# Patient Record
Sex: Female | Born: 1955 | Race: Black or African American | Hispanic: No | Smoking: Never smoker
Health system: Southern US, Community
[De-identification: ages and names within clinical notes are randomized; demographics above are authoritative.]

## PROBLEM LIST (undated history)

## (undated) DIAGNOSIS — N186 End stage renal disease: Secondary | ICD-10-CM

## (undated) DIAGNOSIS — E1129 Type 2 diabetes mellitus with other diabetic kidney complication: Secondary | ICD-10-CM

## (undated) DIAGNOSIS — E039 Hypothyroidism, unspecified: Secondary | ICD-10-CM

## (undated) DIAGNOSIS — M069 Rheumatoid arthritis, unspecified: Secondary | ICD-10-CM

## (undated) DIAGNOSIS — K219 Gastro-esophageal reflux disease without esophagitis: Secondary | ICD-10-CM

## (undated) DIAGNOSIS — N200 Calculus of kidney: Secondary | ICD-10-CM

## (undated) DIAGNOSIS — E1165 Type 2 diabetes mellitus with hyperglycemia: Secondary | ICD-10-CM

## (undated) DIAGNOSIS — J45909 Unspecified asthma, uncomplicated: Secondary | ICD-10-CM

## (undated) DIAGNOSIS — K259 Gastric ulcer, unspecified as acute or chronic, without hemorrhage or perforation: Secondary | ICD-10-CM

## (undated) DIAGNOSIS — Z87442 Personal history of urinary calculi: Secondary | ICD-10-CM

## (undated) DIAGNOSIS — M109 Gout, unspecified: Secondary | ICD-10-CM

## (undated) DIAGNOSIS — R7303 Prediabetes: Secondary | ICD-10-CM

## (undated) DIAGNOSIS — E7800 Pure hypercholesterolemia, unspecified: Secondary | ICD-10-CM

## (undated) DIAGNOSIS — R011 Cardiac murmur, unspecified: Secondary | ICD-10-CM

## (undated) DIAGNOSIS — Z9289 Personal history of other medical treatment: Secondary | ICD-10-CM

## (undated) DIAGNOSIS — Z8719 Personal history of other diseases of the digestive system: Secondary | ICD-10-CM

## (undated) DIAGNOSIS — M1990 Unspecified osteoarthritis, unspecified site: Secondary | ICD-10-CM

## (undated) DIAGNOSIS — D649 Anemia, unspecified: Secondary | ICD-10-CM

## (undated) DIAGNOSIS — E119 Type 2 diabetes mellitus without complications: Secondary | ICD-10-CM

## (undated) DIAGNOSIS — E785 Hyperlipidemia, unspecified: Secondary | ICD-10-CM

## (undated) DIAGNOSIS — I10 Essential (primary) hypertension: Secondary | ICD-10-CM

## (undated) DIAGNOSIS — N920 Excessive and frequent menstruation with regular cycle: Secondary | ICD-10-CM

## (undated) DIAGNOSIS — G4730 Sleep apnea, unspecified: Secondary | ICD-10-CM

## (undated) HISTORY — DX: Type 2 diabetes mellitus with other diabetic kidney complication: E11.29

## (undated) HISTORY — DX: Excessive and frequent menstruation with regular cycle: N92.0

## (undated) HISTORY — DX: Calculus of kidney: N20.0

## (undated) HISTORY — PX: COLONOSCOPY W/ POLYPECTOMY: SHX1380

## (undated) HISTORY — DX: Gastro-esophageal reflux disease without esophagitis: K21.9

## (undated) HISTORY — DX: Hyperlipidemia, unspecified: E78.5

## (undated) HISTORY — DX: Essential (primary) hypertension: I10

## (undated) HISTORY — DX: Prediabetes: R73.03

## (undated) HISTORY — DX: Type 2 diabetes mellitus with hyperglycemia: E11.65

## (undated) HISTORY — DX: End stage renal disease: N18.6

## (undated) HISTORY — DX: Personal history of other medical treatment: Z92.89

## (undated) HISTORY — DX: Pure hypercholesterolemia, unspecified: E78.00

## (undated) HISTORY — PX: OTHER SURGICAL HISTORY: SHX169

## (undated) HISTORY — DX: Unspecified asthma, uncomplicated: J45.909

---

## 1982-02-20 HISTORY — PX: ECTOPIC PREGNANCY SURGERY: SHX613

## 1986-11-22 HISTORY — PX: TUBAL LIGATION: SHX77

## 1998-03-23 HISTORY — PX: ABDOMINAL HYSTERECTOMY: SHX81

## 2005-08-21 HISTORY — PX: OTHER SURGICAL HISTORY: SHX169

## 2010-03-23 DIAGNOSIS — J189 Pneumonia, unspecified organism: Secondary | ICD-10-CM

## 2010-03-23 HISTORY — DX: Pneumonia, unspecified organism: J18.9

## 2010-08-22 LAB — HM PAP SMEAR: HM Pap smear: NORMAL

## 2011-08-22 LAB — HM MAMMOGRAPHY: HM Mammogram: NORMAL

## 2012-11-25 ENCOUNTER — Ambulatory Visit: Payer: Self-pay | Admitting: General Practice

## 2012-12-07 ENCOUNTER — Encounter: Payer: Self-pay | Admitting: Family Medicine

## 2012-12-07 ENCOUNTER — Ambulatory Visit (INDEPENDENT_AMBULATORY_CARE_PROVIDER_SITE_OTHER): Payer: 59 | Admitting: Family Medicine

## 2012-12-07 VITALS — BP 150/98 | Temp 98.5°F | Ht 62.0 in | Wt 204.0 lb

## 2012-12-07 DIAGNOSIS — N189 Chronic kidney disease, unspecified: Secondary | ICD-10-CM

## 2012-12-07 DIAGNOSIS — E1165 Type 2 diabetes mellitus with hyperglycemia: Secondary | ICD-10-CM | POA: Insufficient documentation

## 2012-12-07 DIAGNOSIS — I10 Essential (primary) hypertension: Secondary | ICD-10-CM | POA: Insufficient documentation

## 2012-12-07 DIAGNOSIS — J45909 Unspecified asthma, uncomplicated: Secondary | ICD-10-CM

## 2012-12-07 DIAGNOSIS — M545 Low back pain, unspecified: Secondary | ICD-10-CM | POA: Insufficient documentation

## 2012-12-07 DIAGNOSIS — E119 Type 2 diabetes mellitus without complications: Secondary | ICD-10-CM | POA: Insufficient documentation

## 2012-12-07 DIAGNOSIS — R209 Unspecified disturbances of skin sensation: Secondary | ICD-10-CM

## 2012-12-07 DIAGNOSIS — N186 End stage renal disease: Secondary | ICD-10-CM | POA: Insufficient documentation

## 2012-12-07 DIAGNOSIS — R7303 Prediabetes: Secondary | ICD-10-CM

## 2012-12-07 DIAGNOSIS — E781 Pure hyperglyceridemia: Secondary | ICD-10-CM | POA: Insufficient documentation

## 2012-12-07 DIAGNOSIS — J4520 Mild intermittent asthma, uncomplicated: Secondary | ICD-10-CM | POA: Insufficient documentation

## 2012-12-07 DIAGNOSIS — E785 Hyperlipidemia, unspecified: Secondary | ICD-10-CM

## 2012-12-07 DIAGNOSIS — R7309 Other abnormal glucose: Secondary | ICD-10-CM

## 2012-12-07 DIAGNOSIS — E669 Obesity, unspecified: Secondary | ICD-10-CM | POA: Insufficient documentation

## 2012-12-07 DIAGNOSIS — Z23 Encounter for immunization: Secondary | ICD-10-CM

## 2012-12-07 DIAGNOSIS — R200 Anesthesia of skin: Secondary | ICD-10-CM

## 2012-12-07 HISTORY — DX: Hyperlipidemia, unspecified: E78.5

## 2012-12-07 MED ORDER — CARVEDILOL 12.5 MG PO TABS: 12.5000 mg | ORAL_TABLET | Freq: Two times a day (BID) | ORAL | Status: DC

## 2012-12-07 NOTE — Patient Instructions (Signed)
-  lab appointment on Monday for fasting labs  -We placed a referral for you as discussed to nephrologist and to back specialist. It usually takes about 1-2 weeks to process and schedule this referral. If you have not heard from [place] regarding this appointment in 2 weeks please contact our office.  -We have ordered labs or studies at this visit. It can take up to 1-2 weeks for results and processing. We will contact you with instructions IF your results are abnormal. Normal results will be released to your [facility]. If you have not heard from [place] or can not find your results in [facility] in 2 weeks please contact our office.  -for the blood pressure: -stop the metoprolol and start the carvedilol (coreg) 12.5 mg twice daily and see the kidney specialist - referral placed  -PLEASE SIGN UP FOR [place]   We recommend the following healthy lifestyle measures: - eat a healthy diet consisting of lots of vegetables, fruits, beans, nuts, seeds, healthy meats such as white chicken and fish and whole grains.  - avoid fried foods, fast food, processed foods, sodas, red meet and other fattening foods.  - get a least 150 minutes of aerobic exercise per week.   Follow up in: 2-3 months for CPE

## 2012-12-07 NOTE — Progress Notes (Signed)
Chief Complaint  Patient presents with  . Establish Care  . Hypertension  . right leg numbness    HPI:  Rachael George is here to establish care. New to [place]. Last PCP and physical:  Has the following chronic problems and concerns today:  1)Hypertension: -dx a long time -takes amlodipine 10mg , Metoprolol 100mg  qd, 12.5 daily, clonidine 0.2/24hour -severely allergic to lisinopril -reports stage 3 kidney disease and told to see PCP for referral to nephrologist for this and her BP -reports her BP is always out of control  -no regular exercise; no special diet - poor diet -not a smoker -reports told prediabetic -strong FH hypertension -HA, DOE, SOB, CP, palpitations, swelling  2) Low back pain and R leg numbness: -back pain chronically - worse with certain movement only, no tx needed -for last few months mild numbness radiating into R upper lat leg, and sometimes -denies: pain in leg, weakness, malaise, fevers, bowel or bladder incontinence, unexplained weight loss -she wants to see specialist for this  As is very worried about it  3)Asthma: -as a child -only has occ symptoms when very cold now - uses albuterol rarely  4)HLD: -on lipitor    Patient Active Problem List   Diagnosis Date Noted  . Essential hypertension, benign 12/07/2012  . Prediabetes 12/07/2012  . Chronic kidney disease 12/07/2012  . Low back pain 12/07/2012  . Right leg numbness 12/07/2012  . Asthma, mild intermittent 12/07/2012  . Hyperlipemia 12/07/2012  . Obesity, unspecified 12/07/2012    Health Maintenance:  ROS: See pertinent positives and negatives per HPI.  Past Medical History  Diagnosis Date  . Asthma   . GERD (gastroesophageal reflux disease)   . Hypertension   . High cholesterol   . Kidney disease   . Kidney stones   . History of blood transfusion   . Heavy menstrual bleeding   . Prediabetes   . Hyperlipemia 12/07/2012    Family History  Problem Relation Age of  Onset  . Hypertension Mother   . Breast cancer Maternal Aunt     History   Social History  . Marital Status: Married    Spouse Name: N/A    Number of Children: N/A  . Years of Education: N/A   Social History Main Topics  . Smoking status: Never Smoker   . Smokeless tobacco: None  . Alcohol Use: Yes     Comment: occ - 1 drink rarely  . Drug Use: None  . Sexual Activity: None   Other Topics Concern  . 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      Home Situation:  Lives with husband and daughter and granddaughter      Spiritual Beliefs: baptist      Lifestyle: no regular exercise, poor diet             Current outpatient prescriptions:albuterol (PROAIR HFA) 108 (90 BASE) MCG/ACT inhaler, Inhale 2 puffs into the lungs every 6 (six) hours as needed for wheezing., Disp: , Rfl: ;  amLODipine (NORVASC) 10 MG tablet, Take 10 mg by mouth daily., Disp: , Rfl: ;  atorvastatin (LIPITOR) 20 MG tablet, Take 20 mg by mouth daily., Disp: , Rfl:  carvedilol (COREG) 12.5 MG tablet, Take 1 tablet (12.5 mg total) by mouth 2 (two) times daily with a meal., Disp: 60 tablet, Rfl: 3;  cloNIDine (CATAPRES - DOSED IN MG/24 HR) 0.2 mg/24hr patch, Place 1 patch onto  the skin once a week., Disp: , Rfl: ;  hydrochlorothiazide (MICROZIDE) 12.5 MG capsule, Take 12.5 mg by mouth daily., Disp: , Rfl:   EXAM:  Filed Vitals:   12/07/12 0806  BP: 150/98  Temp: 98.5 F (36.9 C)    Body mass index is 37.3 kg/(m^2).  GENERAL: vitals reviewed and listed above, alert, oriented, appears well hydrated and in no acute distress  HEENT: atraumatic, conjunttiva clear, no obvious abnormalities on inspection of external nose and ears  NECK: no obvious masses on inspection  LUNGS: clear to auscultation bilaterally, no wheezes, rales or rhonchi, good air movement  CV: HRRR, no peripheral edema  MS: moves all extremities without noticeable abnormality Normal  Gait Normal inspection of back, no obvious scoliosis or leg length descrepancy No bony TTP Soft tissue TTP at: R lumbar paraspinal muscles -/+ tests: neg trendelenburg,+facet loading on L, -SLRT, -CLRT, -FABER, -FADIR Normal muscle strength, sensation to light touch (except pt reports sl decreased sensation L lateral thigh) and DTRs in LEs bilaterally  PSYCH: pleasant and cooperative, no obvious depression or anxiety  ASSESSMENT AND PLAN:  Discussed the following assessment and plan:  Need for prophylactic vaccination and inoculation against influenza - Plan: Flu Vaccine QUAD 36+ mos PF IM (Fluarix)  Essential hypertension, benign - Plan: Basic metabolic panel, Ambulatory referral to Nephrology, carvedilol (COREG) 12.5 MG tablet -asymptomatic, chronic, uncontrolled -stop qd metoprolol - start coreg bid -referred to nephrology for pt reported chronically very elevated BP and kidney disease for further management -monitor BP at home and call if running high in meantime - rx for new cuff given  Prediabetes - Plan: Hemoglobin A1c -will do screening hgbA1c -lifestyle recs advised  Chronic kidney disease -nephrology referral  Low back pain - Plan: Ambulatory referral to Physical Medicine Rehab -discussed options, likely DDD with mild radicular symptoms- exam fairly benign -pt wishes to see specialist, referral placed  Right leg numbness - Plan: Methylmalonic Acid, Serum, Folate, Ambulatory referral to Physical Medicine Rehab  Asthma, mild intermittent  Hyperlipemia - Plan: Lipid Panel -continue statin, check fasting labs - she wants to return Monday for theses  Obesity, unspecified -lifestyle recs advised  -We reviewed the PMH, PSH, FH, SH, Meds and Allergies. -We provided refills for any medications we will prescribe as needed. -We addressed current concerns per orders and patient instructions. -We have asked for records for pertinent exams, studies, vaccines and notes from  previous providers. -We have advised patient to follow up per instructions below. -flu vaccine given today -f/u with nephrology regarding HTN -CPE in 2 months or next available with follow up ->45 mintues spent face to face in counseling this patient   -Patient advised to return or notify a doctor immediately if symptoms worsen or persist or new concerns arise.  Patient Instructions  -lab appointment on Monday for fasting labs  -We placed a referral for you as discussed to nephrologist and to back specialist. It usually takes about 1-2 weeks to process and schedule this referral. If you have not heard from [place] regarding this appointment in 2 weeks please contact our office.  -We have ordered labs or studies at this visit. It can take up to 1-2 weeks for results and processing. We will contact you with instructions IF your results are abnormal. Normal results will be released to your [facility]. If you have not heard from [place] or can not find your results in [facility] in 2 weeks please contact our office.  -for the blood pressure: -stop  the metoprolol and start the carvedilol (coreg) 12.5 mg twice daily and see the kidney specialist - referral placed  -PLEASE SIGN UP FOR MYCHART TODAY   We recommend the following healthy lifestyle measures: - eat a healthy diet consisting of lots of vegetables, fruits, beans, nuts, seeds, healthy meats such as white chicken and fish and whole grains.  - avoid fried foods, fast food, processed foods, sodas, red meet and other fattening foods.  - get a least 150 minutes of aerobic exercise per week.   Follow up in: 2-3 months for CPE              Onisha Cedeno, [place]

## 2012-12-12 ENCOUNTER — Other Ambulatory Visit (INDEPENDENT_AMBULATORY_CARE_PROVIDER_SITE_OTHER): Payer: 59

## 2012-12-12 ENCOUNTER — Other Ambulatory Visit (HOSPITAL_COMMUNITY): Payer: Self-pay | Admitting: Physical Medicine and Rehabilitation

## 2012-12-12 DIAGNOSIS — R7303 Prediabetes: Secondary | ICD-10-CM

## 2012-12-12 DIAGNOSIS — I10 Essential (primary) hypertension: Secondary | ICD-10-CM

## 2012-12-12 DIAGNOSIS — R7309 Other abnormal glucose: Secondary | ICD-10-CM

## 2012-12-12 DIAGNOSIS — R209 Unspecified disturbances of skin sensation: Secondary | ICD-10-CM

## 2012-12-12 DIAGNOSIS — E785 Hyperlipidemia, unspecified: Secondary | ICD-10-CM

## 2012-12-12 DIAGNOSIS — R200 Anesthesia of skin: Secondary | ICD-10-CM

## 2012-12-12 DIAGNOSIS — M48061 Spinal stenosis, lumbar region without neurogenic claudication: Secondary | ICD-10-CM

## 2012-12-12 LAB — LIPID PANEL
Cholesterol: 289 mg/dL — ABNORMAL HIGH (ref 0–200)
HDL: 42.8 mg/dL (ref >39.00)
Total CHOL/HDL Ratio: 7
Triglycerides: 241 mg/dL — ABNORMAL HIGH (ref 0.0–149.0)
VLDL: 48.2 mg/dL — ABNORMAL HIGH (ref 0.0–40.0)

## 2012-12-12 LAB — BASIC METABOLIC PANEL
BUN: 40 mg/dL — ABNORMAL HIGH (ref 6–23)
CO2: 29 mEq/L (ref 19–32)
Calcium: 9.2 mg/dL (ref 8.4–10.5)
Chloride: 107 mEq/L (ref 96–112)
Creatinine, Ser: 2.3 mg/dL — ABNORMAL HIGH (ref 0.4–1.2)
GFR: 28.51 mL/min — ABNORMAL LOW (ref >60.00)
Glucose, Bld: 92 mg/dL (ref 70–99)
Potassium: 3.3 mEq/L — ABNORMAL LOW (ref 3.5–5.1)
Sodium: 140 mEq/L (ref 135–145)

## 2012-12-12 LAB — HEMOGLOBIN A1C: Hgb A1c MFr Bld: 6.5 % (ref 4.6–6.5)

## 2012-12-12 LAB — LDL CHOLESTEROL, DIRECT: Direct LDL: 165.8 mg/dL

## 2012-12-12 LAB — FOLATE: Folate: 8.7 ng/mL (ref >5.9)

## 2012-12-13 ENCOUNTER — Telehealth: Payer: Self-pay | Admitting: Family Medicine

## 2012-12-13 ENCOUNTER — Encounter: Payer: Self-pay | Admitting: Family Medicine

## 2012-12-13 NOTE — Progress Notes (Signed)
Received some records from prior PCP - [facility] in [place], [place]. Place in scan box. Labs from 02/2012 show Cr 2.23 EKG from 3 2012 with ITW II-aVF and V3-V6, poor r wave progression Few other remote labs and foot xray read.

## 2012-12-13 NOTE — Progress Notes (Signed)
Quick Note:  Left a message for return call. ______ 

## 2012-12-13 NOTE — Telephone Encounter (Signed)
Pt at home now and you can call now.

## 2012-12-14 NOTE — Telephone Encounter (Signed)
Pt returning your call again!~!    Cell:: [phone]

## 2012-12-15 MED ORDER — ATORVASTATIN CALCIUM 40 MG PO TABS: 40.0000 mg | ORAL_TABLET | Freq: Every day | ORAL | Status: DC

## 2012-12-15 NOTE — Telephone Encounter (Signed)
Called and spoke with pt and pt is aware.  

## 2012-12-15 NOTE — Progress Notes (Signed)
Quick Note:  Called and spoke with pt and pt is aware. ______ 

## 2012-12-16 ENCOUNTER — Ambulatory Visit (HOSPITAL_COMMUNITY)
Admission: RE | Admit: 2012-12-16 | Discharge: 2012-12-16 | Disposition: A | Discharge status: Home / Self Care | Payer: 59 | Source: Ambulatory Visit | Attending: Physical Medicine and Rehabilitation | Admitting: Physical Medicine and Rehabilitation

## 2012-12-16 DIAGNOSIS — R209 Unspecified disturbances of skin sensation: Secondary | ICD-10-CM | POA: Insufficient documentation

## 2012-12-16 DIAGNOSIS — M129 Arthropathy, unspecified: Secondary | ICD-10-CM | POA: Insufficient documentation

## 2012-12-16 DIAGNOSIS — M5137 Other intervertebral disc degeneration, lumbosacral region: Secondary | ICD-10-CM | POA: Insufficient documentation

## 2012-12-16 DIAGNOSIS — M79609 Pain in unspecified limb: Secondary | ICD-10-CM | POA: Insufficient documentation

## 2012-12-16 DIAGNOSIS — Q619 Cystic kidney disease, unspecified: Secondary | ICD-10-CM | POA: Insufficient documentation

## 2012-12-16 DIAGNOSIS — M48061 Spinal stenosis, lumbar region without neurogenic claudication: Secondary | ICD-10-CM

## 2012-12-16 DIAGNOSIS — M549 Dorsalgia, unspecified: Secondary | ICD-10-CM | POA: Insufficient documentation

## 2012-12-16 DIAGNOSIS — M51379 Other intervertebral disc degeneration, lumbosacral region without mention of lumbar back pain or lower extremity pain: Secondary | ICD-10-CM | POA: Insufficient documentation

## 2013-01-13 ENCOUNTER — Encounter: Payer: 59 | Attending: Family Medicine | Admitting: Dietician

## 2013-01-13 ENCOUNTER — Encounter: Payer: Self-pay | Admitting: Dietician

## 2013-01-13 VITALS — Ht 62.0 in | Wt 200.0 lb

## 2013-01-13 DIAGNOSIS — Z713 Dietary counseling and surveillance: Secondary | ICD-10-CM | POA: Insufficient documentation

## 2013-01-13 DIAGNOSIS — E669 Obesity, unspecified: Secondary | ICD-10-CM | POA: Insufficient documentation

## 2013-01-13 NOTE — Patient Instructions (Addendum)
Get a prescription for test strips.  Eat a meal or snack every 3-5 hours you are awake. Bring snacks to work with you if needed.  Fill half of your plate with vegetables. Limit starches to quarter of your plate. Have protein with meals and snacks along with carbohydrates. Drink mostly water or tea with splenda. Have egg beaters instead of eggs everyday. Aim to get 30 minutes of exercise 5 x week. Limit high fat, high salt meat such as bacon and sausage.

## 2013-01-13 NOTE — Progress Notes (Signed)
  Medical Nutrition Therapy:  Appt start time: 0800 end time:  0900.   Assessment:  Primary concerns today: Rachael George is here since doctor recommended that she talk to a dietitian. She has a history high blood pressure (hospitalized recently for HTN), elevated blood sugar (Hgb A1c 6.5%), leg pain, and obesity.   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 She is interested in working on losing weight to help manage her health conditions and improve leg pain.    Preferred Learning Style:   Visual  Learning Readiness:   Ready  MEDICATIONS: see list   DIETARY INTAKE:  24-hr recall:  B (4 AM): bacon, eggs, cheese, mushrooms or hamburger with water or sweet tea  Snk (PM): none  L (2 PM): steak with green beans, protein, vegetables, and hawaiian rolls with tea  Snk ( PM): none D (7-8PM): sweets or fruit Snk ( PM): oatmeal raisin cookies Beverages: water or sweet tea  Usual physical activity: no structured exercise  Estimated energy needs: 1600 calories 180 g carbohydrates 120 g protein 44 g fat  Progress Towards Goal(s):  In progress.   Nutritional Diagnosis:  NB-1.1 Food and nutrition-related knowledge deficit As related to history of high CHO foods and large portion sizes.  As evidenced by BMI of 36.6, HTN, and Hgb A1c of 6.5%.    Intervention:  Nutrition counseling/diet education. Recommended setting up her home and work environment so that it is easier to make healthy choices. Encouraged her to start exercising with her granddaughter (walking or Wii) for the health of both of them. Plan to follow up about diabetes more specifically in next appointment.   Plan: Get a prescription for test strips.  Eat a meal or snack every 3-5 hours you are awake. Bring snacks to work with you if needed.  Fill half of your plate with vegetables. Limit starches to quarter of your plate. Have protein with meals and snacks along with carbohydrates. Drink  mostly water or tea with splenda. Have egg beaters instead of eggs everyday. Aim to get 30 minutes of exercise 5 x week. Limit high fat, high salt meat such as bacon and sausage.   Teaching Method Utilized:  Visual  Handouts given during visit include:  MyPlate Handout  15 g CHO snacks  Yellow card  Barriers to learning/adherence to lifestyle change: work schedule - 2nd and 3rd shift  Demonstrated degree of understanding via: Teach Back   Monitoring/Evaluation:  Dietary intake, exercise, and body weight in 6 week(s).

## 2013-02-06 ENCOUNTER — Encounter: Payer: Self-pay | Admitting: Family Medicine

## 2013-02-06 ENCOUNTER — Ambulatory Visit (INDEPENDENT_AMBULATORY_CARE_PROVIDER_SITE_OTHER): Payer: 59 | Admitting: Family Medicine

## 2013-02-06 VITALS — BP 140/84 | Temp 98.3°F | Ht 61.75 in | Wt 199.0 lb

## 2013-02-06 DIAGNOSIS — N189 Chronic kidney disease, unspecified: Secondary | ICD-10-CM

## 2013-02-06 DIAGNOSIS — M545 Low back pain, unspecified: Secondary | ICD-10-CM

## 2013-02-06 DIAGNOSIS — L738 Other specified follicular disorders: Secondary | ICD-10-CM

## 2013-02-06 DIAGNOSIS — Z0000 Encounter for general adult medical examination without abnormal findings: Secondary | ICD-10-CM

## 2013-02-06 DIAGNOSIS — R7303 Prediabetes: Secondary | ICD-10-CM

## 2013-02-06 DIAGNOSIS — I10 Essential (primary) hypertension: Secondary | ICD-10-CM

## 2013-02-06 DIAGNOSIS — E669 Obesity, unspecified: Secondary | ICD-10-CM

## 2013-02-06 DIAGNOSIS — J4520 Mild intermittent asthma, uncomplicated: Secondary | ICD-10-CM

## 2013-02-06 DIAGNOSIS — R7309 Other abnormal glucose: Secondary | ICD-10-CM

## 2013-02-06 DIAGNOSIS — J45909 Unspecified asthma, uncomplicated: Secondary | ICD-10-CM

## 2013-02-06 DIAGNOSIS — E785 Hyperlipidemia, unspecified: Secondary | ICD-10-CM

## 2013-02-06 DIAGNOSIS — L853 Xerosis cutis: Secondary | ICD-10-CM

## 2013-02-06 MED ORDER — CLONIDINE HCL 0.2 MG/24HR TD PTWK: 0.2000 mg | MEDICATED_PATCH | TRANSDERMAL | Status: DC

## 2013-02-06 MED ORDER — ATORVASTATIN CALCIUM 40 MG PO TABS: 40.0000 mg | ORAL_TABLET | Freq: Every day | ORAL | Status: DC

## 2013-02-06 MED ORDER — CARVEDILOL 12.5 MG PO TABS: 12.5000 mg | ORAL_TABLET | Freq: Two times a day (BID) | ORAL | Status: DC

## 2013-02-06 MED ORDER — AMLODIPINE BESYLATE 10 MG PO TABS: 10.0000 mg | ORAL_TABLET | Freq: Every day | ORAL | Status: DC

## 2013-02-06 MED ORDER — HYDROCHLOROTHIAZIDE 12.5 MG PO CAPS: 12.5000 mg | ORAL_CAPSULE | Freq: Every day | ORAL | Status: DC

## 2013-02-06 NOTE — Progress Notes (Signed)
Chief Complaint  Patient presents with  . Annual Exam    HPI:  Here for CPE:  -Concerns today:   1)Hypertension:  -dx a long time ago, chronically uncontrolled - referred to renal last visit for reported chronically uncontrolled htn and renal disease-she has not yet made an appt but BP is better -also changed BB and provided rx for new cuff last visit  -takes amlodipine 10mg , coreg, 12.5 daily, clonidine 0.2/24hour  -severely allergic to lisinopril  -no regular exercise;  no special diet - poor diet  -not a smoker  -strong FH hypertension   -HA, DOE, SOB, CP, palpitations, swelling  2) Low back pain and R leg numbness:  -back pain chronically - worse with certain movement only, no tx needed  -for last few months mild numbness radiating into R upper lat leg, and sometimes  -denies: pain in leg, weakness, malaise, fevers, bowel or bladder incontinence, unexplained weight loss  -referred last visit to [place] - reports saw Dr. Mina Marble, had imaging and MRI - she is scheduled to get cortisone shots   3)Asthma:  -as a child  -only has occ symptoms when very cold now - uses albuterol rarely   4)HLD:  -on lipitor - increased last visit, but she has not gotten the rx yet because sent to different pharmacy  6) Mild diabetes: -referred to nutritionist, lifestyle recs  7)CKD: -referred to nephrology last vist  -Diet: variety of foods, balance and well rounded, larger portion sizes - saw nutritionist - working on increasing veggies, portion sizes, has follow up in December to talk about diabetes  -Vit D and calcium: yes  -Exercise: 30 minutes walking a few times per week, going to start zumba  -Vaccines: UTD  -pap history: 10/2011, normal and she reports all have been normal  -FDLMP: postmenopausal, s/p hysterectomy  -sexual activity: yes, female partner, no new partners  -wants STI testing, hep C: no  -FH breast, colon or ovarian ca: see FH -last mammo 2012 -refused colonoscopy  now, will do stool cards  -Alcohol, Tobacco, drug use: see social history  Review of Systems - Review of Systems  Constitutional: Negative for fever, weight loss and malaise/fatigue.  HENT: Negative for ear pain and hearing loss.   Eyes: Negative for blurred vision.  Respiratory: Negative for cough and shortness of breath.   Cardiovascular: Negative for chest pain, palpitations and leg swelling.  Gastrointestinal: Negative for nausea, blood in stool and melena.  Genitourinary: Negative for dysuria.  Musculoskeletal: Positive for back pain and myalgias. Negative for falls.  Skin: Negative for rash.  Neurological: Negative for dizziness and loss of consciousness.  Endo/Heme/Allergies: Does not bruise/bleed easily.  Psychiatric/Behavioral: Negative for depression and memory loss.     Past Medical History  Diagnosis Date  . Asthma   . GERD (gastroesophageal reflux disease)   . Hypertension   . High cholesterol   . Kidney disease   . Kidney stones   . History of blood transfusion   . Heavy menstrual bleeding   . Prediabetes   . Hyperlipemia 12/07/2012    Past Surgical History  Procedure Laterality Date  . Abdominal hysterectomy  2000  . Left shoulder surgery  08/2005  . Tubal ligation  11/1986  . Ectopic pregnancy surgery  02/1982    Family History  Problem Relation Age of Onset  . Hypertension Mother   . Breast cancer Maternal Aunt     History   Social History  . Marital Status: Married  Spouse Name: N/A    Number of Children: N/A  . Years of Education: N/A   Social History Main Topics  . Smoking status: Never Smoker   . Smokeless tobacco: None  . Alcohol Use: Yes     Comment: occ - 1 drink rarely  . Drug Use: None  . Sexual Activity: None   Other Topics Concern  . 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      Home Situation:  Lives with husband and daughter and granddaughter      Spiritual Beliefs: baptist       Lifestyle: no regular exercise, poor diet             Current outpatient prescriptions:albuterol (PROAIR HFA) 108 (90 BASE) MCG/ACT inhaler, Inhale 2 puffs into the lungs every 6 (six) hours as needed for wheezing., Disp: , Rfl: ;  amLODipine (NORVASC) 10 MG tablet, Take 1 tablet (10 mg total) by mouth daily., Disp: 90 tablet, Rfl: 1;  atorvastatin (LIPITOR) 40 MG tablet, Take 1 tablet (40 mg total) by mouth daily., Disp: 90 tablet, Rfl: 1 carvedilol (COREG) 12.5 MG tablet, Take 1 tablet (12.5 mg total) by mouth 2 (two) times daily with a meal., Disp: 180 tablet, Rfl: 1;  carvedilol (COREG) 12.5 MG tablet, Take 1 tablet (12.5 mg total) by mouth 2 (two) times daily with a meal., Disp: 180 tablet, Rfl: 1;  cloNIDine (CATAPRES - DOSED IN MG/24 HR) 0.2 mg/24hr patch, Place 1 patch (0.2 mg total) onto the skin once a week., Disp: 4 patch, Rfl: 5 hydrochlorothiazide (MICROZIDE) 12.5 MG capsule, Take 1 capsule (12.5 mg total) by mouth daily., Disp: 90 capsule, Rfl: 1  EXAM:  Filed Vitals:   02/06/13 0813  BP: 140/84  Temp: 98.3 F (36.8 C)    GENERAL: vitals reviewed and listed below, alert, oriented, appears well hydrated and in no acute distress  HEENT: head atraumatic, PERRLA, normal appearance of eyes, ears, nose and mouth. moist mucus membranes.  NECK: supple, no masses or lymphadenopathy  LUNGS: clear to auscultation bilaterally, no rales, rhonchi or wheeze  CV: HRRR, no peripheral edema or cyanosis, normal pedal pulses  BREAST: normal appearance - no lesions or discharge, on palpation normal breast tissue without any suspicious masses  ABDOMEN: bowel sounds normal, soft, non tender to palpation, no masses, no rebound or guarding  GU: deferred  RECTAL: refused  SKIN: no rash or abnormal lesions  MS: normal gait, moves all extremities normally  NEURO: CN II-XII grossly intact, normal muscle strength and sensation to light touch on extremities  PSYCH: normal affect,  pleasant and cooperative  ASSESSMENT AND PLAN:  Discussed the following assessment and plan:  Visit for preventive health examination -Discussed and advised all [place] preventive services health task force level A and B recommendations for age, sex and risks. -advised mammo and info to schedule given, advised self breast exams -discussed colon cancer screening - she doesn't want to do colonoscopy now so stool cards given -advised vit d and calcium -Advised at least 150 minutes of exercise per week and a healthy diet low in saturated fats and sweets and consisting of fresh fruits and vegetables, lean meats such as fish and white chicken and whole grains.  -labs, studies and vaccines per orders this encounter  Essential hypertension, benign - Plan: carvedilol (COREG) 12.5 MG tablet, carvedilol (COREG) 12.5 MG tablet -cont current meds, get cuff and monitor, make appointment with renal  Prediabetes -nutritionist, diet and exercise  Chronic kidney disease -referred to renal but she has not yet scheduled appt - advised she call to get appt today  Low back pain -seeing PMR -max tylenol dosing info provided  Asthma, mild intermittent -stable  Hyperlipemia -she did not increase statin as advised last visit -advised increased dose, staff sent rx to pharmacy of her choice -check fasting labs at follow up  Obesity, unspecified -lifestyle recs, nutritionist  Dry skin -cerave    No orders of the defined types were placed in this encounter.    Patient Instructions  We recommend the following healthy lifestyle measures: - eat a healthy diet consisting of lots of vegetables, fruits, beans, nuts, seeds, healthy meats such as white chicken and fish and whole grains.  - avoid fried foods, fast food, processed foods, sodas, red meet and other fattening foods.  - get a least 150 minutes of aerobic exercise per week.   Please schedule mammogram now: [phone]  Make sure to call the  kidney doctor and set up an appointment  Make sure to get new prescription for cholesterol medication  Can use topical menthol and casacin sports creams for pain  Can use up to 3000mg  Tylenol daily - NOT MORE  Cerave Cream for dry itchy skin  Follow up 3-4 months      Patient advised to return to clinic immediately if symptoms worsen or persist or new concerns.    No Follow-up on file.  Colin Benton R.

## 2013-02-06 NOTE — Patient Instructions (Signed)
We recommend the following healthy lifestyle measures: - eat a healthy diet consisting of lots of vegetables, fruits, beans, nuts, seeds, healthy meats such as white chicken and fish and whole grains.  - avoid fried foods, fast food, processed foods, sodas, red meet and other fattening foods.  - get a least 150 minutes of aerobic exercise per week.   Please schedule mammogram now: [phone]  Make sure to call the kidney doctor and set up an appointment  Make sure to get new prescription for cholesterol medication  Can use topical menthol and casacin sports creams for pain  Can use up to 3000mg  Tylenol daily - NOT MORE  Cerave Cream for dry itchy skin  Follow up 3-4 months

## 2013-02-06 NOTE — Progress Notes (Signed)
Pre visit review using our clinic review tool, if applicable. No additional management support is needed unless otherwise documented below in the visit note. 

## 2013-02-24 ENCOUNTER — Encounter: Payer: 59 | Attending: Family Medicine | Admitting: Dietician

## 2013-02-24 VITALS — Ht 62.0 in | Wt 199.2 lb

## 2013-02-24 DIAGNOSIS — Z713 Dietary counseling and surveillance: Secondary | ICD-10-CM | POA: Insufficient documentation

## 2013-02-24 DIAGNOSIS — E669 Obesity, unspecified: Secondary | ICD-10-CM | POA: Insufficient documentation

## 2013-02-24 NOTE — Patient Instructions (Addendum)
Eat a meal or snack every 3-5 hours you are awake. Bring snacks to work with you if needed.  Fill half of your plate with vegetables. Limit starches to quarter of your plate. Have protein with meals and snacks along with carbohydrates. Drink mostly water or tea with splenda. Have egg beaters instead of eggs everyday. Aim to get 30 minutes of exercise 5 x week. Limit high fat, high salt meat such as bacon to one slice or have just vegetable with eggs.  Just weigh yourself 1 x week, try to stay motivated by noticing how you feel (clothes and energy).  Consider having oatmeal for breakfast sometimes.

## 2013-02-24 NOTE — Progress Notes (Signed)
  Medical Nutrition Therapy:  Appt start time: 0800 end time:  0830.  Assessment:  Primary concerns today: Rachael George is here for a follow up for high blood pressure and obesity. States she is drinking more water, filling half of her plate with vegetables (using MyPlate proportions). Went back to her doctor and blood pressure when down. Starting walking 2 x week and might look to get a stationary bike.    Preferred Learning Style:   Visual  Learning Readiness:   Ready  MEDICATIONS: see list   DIETARY INTAKE:  24-hr recall:  B (4 AM): 2 pieces of bacon, egg beaters, cheese, mushrooms or with water or decaf coffee Snk (10 AM): fruit, [employer] bar with water L (2:30 PM): [place] sandwich with tea Snk ( PM): none D (6:30-7PM): salad with grilled chicken and fat free dressing with croutons Snk ( PM): none, nabs sometimes Beverages: mostly water or 1 sweet tea and decaf coffee   Usual physical activity: walking 20-25 minutes 2 x week  Estimated energy needs: 1600 calories 180 g carbohydrates 120 g protein 44 g fat  Progress Towards Goal(s):  In progress.   Nutritional Diagnosis:  NB-1.1 Food and nutrition-related knowledge deficit As related to history of high CHO foods and large portion sizes.  As evidenced by BMI of 36.6, HTN, and Hgb A1c of 6.5%.    Intervention:  Nutrition counseling/diet education. Encouraged Asley to keep up the good work with diet and exercise and focus on her energy level and how her clothes are fitting if the number on the scale isn't dropping as quickly as she would like it to.    Plan: Eat a meal or snack every 3-5 hours you are awake. Bring snacks to work with you if needed.  Fill half of your plate with vegetables. Limit starches to quarter of your plate. Have protein with meals and snacks along with carbohydrates. Drink mostly water or tea with splenda. Have egg beaters instead of eggs everyday. Aim to get 30 minutes of exercise 5 x week. Limit  high fat, high salt meat such as bacon to one slice or have just vegetable with eggs.  Just weigh yourself 1 x week, try to stay motivated by noticing how you feel (clothes and energy).  Consider having oatmeal for breakfast sometimes.   Teaching Method Utilized:  Visual  Barriers to learning/adherence to lifestyle change: work schedule - 2nd and 3rd shift  Demonstrated degree of understanding via: Teach Back   Monitoring/Evaluation:  Dietary intake, exercise, and body weight in 2 month(s).

## 2013-03-06 ENCOUNTER — Other Ambulatory Visit: Payer: Self-pay

## 2013-03-06 DIAGNOSIS — Z1231 Encounter for screening mammogram for malignant neoplasm of breast: Secondary | ICD-10-CM

## 2013-03-29 ENCOUNTER — Encounter: Payer: Self-pay | Admitting: Family Medicine

## 2013-03-29 ENCOUNTER — Ambulatory Visit (INDEPENDENT_AMBULATORY_CARE_PROVIDER_SITE_OTHER): Payer: 59 | Admitting: Family Medicine

## 2013-03-29 VITALS — BP 180/100 | HR 72 | Temp 98.6°F | Wt 200.0 lb

## 2013-03-29 DIAGNOSIS — I10 Essential (primary) hypertension: Secondary | ICD-10-CM

## 2013-03-29 DIAGNOSIS — E785 Hyperlipidemia, unspecified: Secondary | ICD-10-CM

## 2013-03-29 DIAGNOSIS — E119 Type 2 diabetes mellitus without complications: Secondary | ICD-10-CM

## 2013-03-29 DIAGNOSIS — R42 Dizziness and giddiness: Secondary | ICD-10-CM

## 2013-03-29 NOTE — Progress Notes (Deleted)
   Subjective:    Patient ID: Rachael George, female    DOB: 05/15/1955, 58 y.o.   MRN: [id number]  HPI    Review of Systems     Objective:   Physical Exam        Assessment & Plan:

## 2013-03-29 NOTE — Progress Notes (Signed)
Pre visit review using our clinic review tool, if applicable. No additional management support is needed unless otherwise documented below in the visit note. 

## 2013-03-29 NOTE — Patient Instructions (Addendum)
-  We placed a referral for you as discussed to the nbeurologist for you dizziness, blood pressure issues, cholesterol problems. It usually takes about 1-2 weeks to process and schedule this referral. If you have not heard from [place] regarding this appointment in 2 weeks please contact our office.  -do exercises provided  -follow up with your nephrologist as advised  -increase coreg to 25mg  twice daily until you see the nephrologist  -if worsening symptoms, CP or new symptoms see a doctor immediately

## 2013-03-29 NOTE — Progress Notes (Signed)
Chief Complaint  Patient presents with  . Dizziness    HPI:  Vertigo: -started 3 days ago -only occurs when turns head suddenly and last briefly - room spinning -denies: nausea, CP, SOB, palpitations, change in edema, HA, vision changes -when did dix hall pike to the L she felt like had blurry vision and the resolved  HTN: -has chronic HTN and kidney disease -sees her nephrologist next week ([facility]) -SBP 160 earlier this week -had angioedema with acei - on diuretic, coreg, norvasc and clonidine  ROS: See pertinent positives and negatives per HPI.  Past Medical History  Diagnosis Date  . Asthma   . GERD (gastroesophageal reflux disease)   . Hypertension   . High cholesterol   . Kidney disease   . Kidney stones   . History of blood transfusion   . Heavy menstrual bleeding   . Prediabetes   . Hyperlipemia 12/07/2012    Past Surgical History  Procedure Laterality Date  . Abdominal hysterectomy  2000  . Left shoulder surgery  08/2005  . Tubal ligation  11/1986  . Ectopic pregnancy surgery  02/1982    Family History  Problem Relation Age of Onset  . Hypertension Mother   . Breast cancer Maternal Aunt     History   Social History  . Marital Status: Married    Spouse Name: N/A    Number of Children: N/A  . Years of Education: N/A   Social History Main Topics  . Smoking status: Never Smoker   . Smokeless tobacco: None  . Alcohol Use: Yes     Comment: occ - 1 drink rarely  . Drug Use: None  . Sexual Activity: None   Other Topics Concern  . 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      Home Situation:  Lives with husband and daughter and granddaughter      Spiritual Beliefs: baptist      Lifestyle: no regular exercise, poor diet             Current outpatient prescriptions:albuterol (PROAIR HFA) 108 (90 BASE) MCG/ACT inhaler, Inhale 2 puffs into the lungs every 6 (six) hours as needed for wheezing.,  Disp: , Rfl: ;  amLODipine (NORVASC) 10 MG tablet, Take 1 tablet (10 mg total) by mouth daily., Disp: 90 tablet, Rfl: 1;  atorvastatin (LIPITOR) 40 MG tablet, Take 1 tablet (40 mg total) by mouth daily., Disp: 90 tablet, Rfl: 1 carvedilol (COREG) 12.5 MG tablet, Take 1 tablet (12.5 mg total) by mouth 2 (two) times daily with a meal., Disp: 180 tablet, Rfl: 1;  carvedilol (COREG) 12.5 MG tablet, Take 1 tablet (12.5 mg total) by mouth 2 (two) times daily with a meal., Disp: 180 tablet, Rfl: 1;  cloNIDine (CATAPRES - DOSED IN MG/24 HR) 0.2 mg/24hr patch, Place 1 patch (0.2 mg total) onto the skin once a week., Disp: 4 patch, Rfl: 5 hydrochlorothiazide (MICROZIDE) 12.5 MG capsule, Take 1 capsule (12.5 mg total) by mouth daily., Disp: 90 capsule, Rfl: 1  EXAM:  Filed Vitals:   03/29/13 0820  BP: 180/100  Pulse: 72  Temp: 98.6 F (37 C)    Body mass index is 36.57 kg/(m^2).  GENERAL: vitals reviewed and listed above, alert, oriented, appears well hydrated and in no acute distress  HEENT: atraumatic, PERRLA, visual acuity grossly intact, conjunttiva clear, no obvious abnormalities on inspection of external nose and ears  NECK: no obvious  masses on inspection, no carotid bruits  LUNGS: clear to auscultation bilaterally, no wheezes, rales or rhonchi, good air movement  CV: HRRR, no peripheral edema  MS: moves all extremities without noticeable abnormality  PSYCH: pleasant and cooperative, no obvious depression or anxiety  NEURO: dix hallpike reproduced symptoms to R, ?blurry vision when tested to the L, no nystagmus, CN II-XII grossly intact, finger to nose normal, gait normal  ASSESSMENT AND PLAN:  Discussed the following assessment and plan:  Dizziness - Plan: Ambulatory referral to Neurology, CANCELED: Ambulatory referral to Cardiology  HTN (hypertension) - Plan: Basic metabolic panel, CANCELED: Ambulatory referral to Cardiology  HLD (hyperlipidemia) - Plan: Lipid Panel, CANCELED:  Ambulatory referral to Cardiology  Diabetes  -dizziness in terms of description seems like middle ear - BBPV and dix hallpike to R reproduced symptoms, to the left she felt like vision was blurry - this concerns me and advised imaging -we discussed possible etiologies (including CV), workup and treatment, treatment risks and return precautions -advised while could be BPPV, other more ominous etiologies must be considered given her risk factors and dix hallpike findings- she is reluctant to do imaging, cardiac testing and prefers to see neurologistt after discussion and then determine if these tests are needed -for HTN advised increasing coreg and she has follow up with her nephrologist next week for management of her resistant HTN -basic labs, she wants to return fastin -of course, we advised Rachael George  to return or notify a doctor immediately if symptoms worsen or persist or new concerns arise. We also discussed ED precuations.    -Patient advised to return or notify a doctor immediately if symptoms worsen or persist or new concerns arise.  Patient Instructions  -We placed a referral for you as discussed to the nbeurologist for you dizziness, blood pressure issues, cholesterol problems. It usually takes about 1-2 weeks to process and schedule this referral. If you have not heard from [place] regarding this appointment in 2 weeks please contact our office.  -do exercises provided  -follow up with your nephrologist as advised  -increase coreg to 25mg  twice daily until you see the nephrologist  -if worsening symptoms, CP or new symptoms see a doctor immediately      Colin Benton R.

## 2013-03-30 ENCOUNTER — Telehealth: Payer: Self-pay

## 2013-03-30 ENCOUNTER — Telehealth: Payer: Self-pay | Admitting: Family Medicine

## 2013-03-30 NOTE — Telephone Encounter (Signed)
Relevant patient education assigned to patient using Emmi. ° °

## 2013-04-03 ENCOUNTER — Telehealth: Payer: Self-pay | Admitting: Family Medicine

## 2013-04-03 NOTE — Telephone Encounter (Signed)
Opened in error

## 2013-04-05 ENCOUNTER — Other Ambulatory Visit (INDEPENDENT_AMBULATORY_CARE_PROVIDER_SITE_OTHER): Payer: 59

## 2013-04-05 DIAGNOSIS — I10 Essential (primary) hypertension: Secondary | ICD-10-CM

## 2013-04-05 DIAGNOSIS — E785 Hyperlipidemia, unspecified: Secondary | ICD-10-CM

## 2013-04-05 LAB — LIPID PANEL
Cholesterol: 215 mg/dL — ABNORMAL HIGH (ref 0–200)
HDL: 40.3 mg/dL (ref >39.00)
Total CHOL/HDL Ratio: 5
Triglycerides: 146 mg/dL (ref 0.0–149.0)
VLDL: 29.2 mg/dL (ref 0.0–40.0)

## 2013-04-05 LAB — BASIC METABOLIC PANEL
BUN: 51 mg/dL — ABNORMAL HIGH (ref 6–23)
CO2: 27 mEq/L (ref 19–32)
Calcium: 9.3 mg/dL (ref 8.4–10.5)
Chloride: 106 mEq/L (ref 96–112)
Creatinine, Ser: 2.9 mg/dL — ABNORMAL HIGH (ref 0.4–1.2)
GFR: 21.92 mL/min — ABNORMAL LOW (ref >60.00)
Glucose, Bld: 99 mg/dL (ref 70–99)
Potassium: 3.2 mEq/L — ABNORMAL LOW (ref 3.5–5.1)
Sodium: 140 mEq/L (ref 135–145)

## 2013-04-05 LAB — LDL CHOLESTEROL, DIRECT: Direct LDL: 130.7 mg/dL

## 2013-04-11 ENCOUNTER — Ambulatory Visit
Admission: RE | Admit: 2013-04-11 | Discharge: 2013-04-11 | Disposition: A | Discharge status: Home / Self Care | Payer: 59 | Source: Ambulatory Visit

## 2013-04-11 DIAGNOSIS — Z1231 Encounter for screening mammogram for malignant neoplasm of breast: Secondary | ICD-10-CM

## 2013-05-05 ENCOUNTER — Ambulatory Visit (INDEPENDENT_AMBULATORY_CARE_PROVIDER_SITE_OTHER): Payer: 59 | Admitting: Neurology

## 2013-05-05 ENCOUNTER — Encounter: Payer: Self-pay | Admitting: Neurology

## 2013-05-05 ENCOUNTER — Encounter: Payer: 59 | Attending: Family Medicine | Admitting: Dietician

## 2013-05-05 VITALS — BP 160/90 | HR 86 | Temp 97.7°F | Ht 62.0 in | Wt 203.0 lb

## 2013-05-05 VITALS — Ht 62.0 in | Wt 202.2 lb

## 2013-05-05 DIAGNOSIS — M25559 Pain in unspecified hip: Secondary | ICD-10-CM

## 2013-05-05 DIAGNOSIS — E669 Obesity, unspecified: Secondary | ICD-10-CM | POA: Insufficient documentation

## 2013-05-05 DIAGNOSIS — G5711 Meralgia paresthetica, right lower limb: Secondary | ICD-10-CM

## 2013-05-05 DIAGNOSIS — M545 Low back pain, unspecified: Secondary | ICD-10-CM

## 2013-05-05 DIAGNOSIS — M25551 Pain in right hip: Secondary | ICD-10-CM

## 2013-05-05 DIAGNOSIS — G5710 Meralgia paresthetica, unspecified lower limb: Secondary | ICD-10-CM

## 2013-05-05 DIAGNOSIS — Z713 Dietary counseling and surveillance: Secondary | ICD-10-CM | POA: Insufficient documentation

## 2013-05-05 NOTE — Patient Instructions (Signed)
The numbness is likely due to pinching of a sensory nerve at the hip region, which causes numbness and tingling on the side of your thigh.  Recommend trying to lose weight.  It does not seem like a pinched nerve from the back. Back and hip pain may be related to arthritis.  Call if you have questions or concerns.

## 2013-05-05 NOTE — Progress Notes (Signed)
  Medical Nutrition Therapy:  Appt start time: 0820 end time:  0850.  Assessment:  Primary concerns today: Rachael George is here for a follow up for high blood pressure and obesity and gained 3 lbs. Has been having a lot pain in her back over the last 3 weeks. Was walking 2 x week up until 3 weeks ago. Still would like to get a stationary bike.   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 One foot swells everyday after working.    Wt Readings from Last 3 Encounters:  05/05/13 202 lb 3.2 oz (91.717 kg)  03/29/13 200 lb (90.719 kg)  02/24/13 199 lb 3.2 oz (90.357 kg)   Ht Readings from Last 3 Encounters:  05/05/13 5\' 2"  (1.575 m)  02/24/13 5\' 2"  (1.575 m)  02/06/13 5' 1.75" (1.568 m)   Body mass index is 36.97 kg/(m^2). @BMIFA @ Normalized weight-for-age data available only for age 58 to 1 years. Normalized stature-for-age data available only for age 58 to 53 years.    Preferred Learning Style:   Visual  Learning Readiness:   Ready  MEDICATIONS: see list   DIETARY INTAKE:  24-hr recall:  B (4 AM): oatmeal or 2 pieces of bacon, egg beaters, cheese, mushrooms or with water or decaf coffee Snk (10 AM): fruit bar with water L (2:30 PM): grilled chicken salad or [place] sandwich with tea Snk ( PM): none D (6:30-7PM): salad with grilled chicken and fat free dressing with croutons or 3 oz meat with vegetables  Snk ( PM): none Beverages: mostly water or 1 sweet tea and decaf coffee   Usual physical activity: none  Estimated energy needs: 1600 calories 180 g carbohydrates 120 g protein 44 g fat  Progress Towards Goal(s):  In progress.   Nutritional Diagnosis:  NB-1.1 Food and nutrition-related knowledge deficit As related to history of high CHO foods and large portion sizes.  As evidenced by BMI of 36.6, HTN, and Hgb A1c of 6.5%.    Intervention:  Nutrition counseling/diet education.   Plan: Eat a meal or snack every 3-5 hours you are awake. Bring  snacks to work with you if needed.  Fill half of your plate with vegetables. Limit starches to quarter of your plate. Have protein with meals and snacks along with carbohydrates. Drink mostly water or tea with splenda. Have egg beaters instead of eggs everyday. Continue to limit high fat, high salt meat such as bacon to one slice or have just vegetable with eggs.  Just weigh yourself 1 x week, try to stay motivated by noticing how you feel (clothes and energy).  Continue having oatmeal for breakfast 3 x week.   Consider going to [facility] exercise classes at the [facility].  Teaching Method Utilized:  Visual  Barriers to learning/adherence to lifestyle change: work schedule - 2nd and 3rd shift  Demonstrated degree of understanding via: Teach Back   Monitoring/Evaluation:  Dietary intake, exercise, and body weight in prn.

## 2013-05-05 NOTE — Progress Notes (Signed)
NEUROLOGY CONSULTATION NOTE  FANTASIA NEWGENT MRN: [id number] DOB: 1955/07/06  Referring provider: Dr. Maudie Mercury Primary care provider: Dr. Maudie Mercury  Reason for consult:  Dizziness.  Right leg numbness.  HISTORY OF PRESENT ILLNESS: Rachael George is a 58 year old left-handed woman with history of hypertension, chronic kidney disease, hyperlipidemia and type II diabetes who presents for right leg numbness and tingling.  Records and images were personally reviewed where available.    She was originally referred for dizziness, but that has since resolved.  She tells me about right leg numbness and tingling that started this past September.  It involves the lateral aspect of her right thigh.  She has non-radiating bilateral back pain, as well as discomfort in the right hip.  There is no shooting pain radiating down the leg.  There is no leg weakness.  There is no bowel or bladder dysfunction.  She denies any recent significant fluctuation in weight.    12/16/12 MRI Lumbar spine: mild multilevel degenerative changes without significant disc protrusion, spinal stenosis or nerve root impingement.   PAST MEDICAL HISTORY: Past Medical History  Diagnosis Date  . Asthma   . GERD (gastroesophageal reflux disease)   . Hypertension   . High cholesterol   . Kidney disease   . Kidney stones   . History of blood transfusion   . Heavy menstrual bleeding   . Prediabetes   . Hyperlipemia 12/07/2012    PAST SURGICAL HISTORY: Past Surgical History  Procedure Laterality Date  . Abdominal hysterectomy  2000  . Left shoulder surgery  08/2005  . Tubal ligation  11/1986  . Ectopic pregnancy surgery  02/1982    MEDICATIONS: Current Outpatient Prescriptions on File Prior to Visit  Medication Sig Dispense Refill  . albuterol (PROAIR HFA) 108 (90 BASE) MCG/ACT inhaler Inhale 2 puffs into the lungs every 6 (six) hours as needed for wheezing.      Marland Kitchen amLODipine (NORVASC) 10 MG tablet Take 1 tablet (10 mg total) by  mouth daily.  90 tablet  1  . atorvastatin (LIPITOR) 40 MG tablet Take 1 tablet (40 mg total) by mouth daily.  90 tablet  1  . carvedilol (COREG) 12.5 MG tablet Take 1 tablet (12.5 mg total) by mouth 2 (two) times daily with a meal.  180 tablet  1  . carvedilol (COREG) 12.5 MG tablet Take 1 tablet (12.5 mg total) by mouth 2 (two) times daily with a meal.  180 tablet  1  . cloNIDine (CATAPRES - DOSED IN MG/24 HR) 0.2 mg/24hr patch Place 1 patch (0.2 mg total) onto the skin once a week.  4 patch  5  . furosemide (LASIX) 40 MG tablet Take 40 mg by mouth 2 (two) times daily.      . hydrochlorothiazide (MICROZIDE) 12.5 MG capsule Take 1 capsule (12.5 mg total) by mouth daily.  90 capsule  1   No current facility-administered medications on file prior to visit.    ALLERGIES: Allergies  Allergen Reactions  . Atacand [Candesartan] Anaphylaxis  . Lisinopril Anaphylaxis  . Motrin [Ibuprofen] Anaphylaxis  . Nsaids Anaphylaxis  . Penicillins Rash    FAMILY HISTORY: Family History  Problem Relation Age of Onset  . Hypertension Mother   . Breast cancer Maternal Aunt     SOCIAL HISTORY: History   Social History  . Marital Status: Married    Spouse Name: N/A    Number of Children: N/A  . Years of Education: N/A   Occupational History  .  Not on file.   Social History Main Topics  . Smoking status: Never Smoker   . Smokeless tobacco: Not on file  . Alcohol Use: Yes     Comment: occ - 1 drink rarely  . Drug Use: Not on file  . Sexual Activity: Not on file   Other Topics Concern  . 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      Home Situation:  Lives with husband and daughter and granddaughter      Spiritual Beliefs: baptist      Lifestyle: no regular exercise, poor diet             REVIEW OF SYSTEMS: Constitutional: No fevers, chills, or sweats, no generalized fatigue, change in appetite Eyes: No visual changes, double  vision, eye pain Ear, nose and throat: No hearing loss, ear pain, nasal congestion, sore throat Cardiovascular: No chest pain, palpitations Respiratory:  No shortness of breath at rest or with exertion, wheezes GastrointestinaI: No nausea, vomiting, diarrhea, abdominal pain, fecal incontinence Genitourinary:  No dysuria, urinary retention or frequency Musculoskeletal:  No neck pain, back pain Integumentary: No rash, pruritus, skin lesions Neurological: as above Psychiatric: No depression, insomnia, anxiety Endocrine: No palpitations, fatigue, diaphoresis, mood swings, change in appetite, change in weight, increased thirst Hematologic/Lymphatic:  No anemia, purpura, petechiae. Allergic/Immunologic: no itchy/runny eyes, nasal congestion, recent allergic reactions, rashes  PHYSICAL EXAM: Filed Vitals:   05/05/13 0857  BP: 160/90  Pulse: 86  Temp: 97.7 F (36.5 C)   General: No acute distress Head:  Normocephalic/atraumatic Neck: supple, no paraspinal tenderness, full range of motion Back: No paraspinal tenderness Heart: regular rate and rhythm Lungs: Clear to auscultation bilaterally. Vascular: No carotid bruits. Neurological Exam: Mental status: alert and oriented to person, place, and time, speech fluent and not dysarthric, language intact. Cranial nerves: CN I: not tested CN II: pupils equal, round and reactive to light, visual fields intact, fundi unremarkable. CN III, IV, VI:  full range of motion, no nystagmus, no ptosis CN V: facial sensation intact CN VII: upper and lower face symmetric CN VIII: hearing intact CN IX, X: gag intact, uvula midline CN XI: sternocleidomastoid and trapezius muscles intact CN XII: tongue midline Bulk & Tone: normal, no fasciculations. Motor:  5/5 throughout Sensation: endorses reduced pinprick sensation and tingling in the lateral aspect of her right leg Deep Tendon Reflexes: 2+ throughout, toes down Finger to nose testing: no  dysmetria Heel to shin: no dysmetria Gait: No ataxia.  Normal stride. Romberg negative.  IMPRESSION: 1.  Right meralgia paresthetica 2.  Back pain, right hip discomfort.  Consider related to arthritis.   PLAN: 1.  Discussed trying to lose weight and not to wear tight pants or belts. 2.  Follow up as needed  45 minutes spent with patient, over 50% spent counseling and coordinating care.  Thank you for allowing me to take part in the care of this patient.  Metta Clines, DO  CC:  Colin Benton, DO

## 2013-05-05 NOTE — Patient Instructions (Addendum)
Eat a meal or snack every 3-5 hours you are awake. Bring snacks to work with you if needed.  Fill half of your plate with vegetables. Limit starches to quarter of your plate. Have protein with meals and snacks along with carbohydrates. Drink mostly water or tea with splenda. Have egg beaters instead of eggs everyday. Continue to limit high fat, high salt meat such as bacon to one slice or have just vegetable with eggs.  Just weigh yourself 1 x week, try to stay motivated by noticing how you feel (clothes and energy).  Continue having oatmeal for breakfast 3 x week.   Consider going to water exercise classes at the [facility].

## 2013-05-09 ENCOUNTER — Ambulatory Visit: Payer: 59 | Admitting: Family Medicine

## 2013-05-12 ENCOUNTER — Encounter: Payer: Self-pay | Admitting: Family Medicine

## 2013-05-12 ENCOUNTER — Ambulatory Visit (INDEPENDENT_AMBULATORY_CARE_PROVIDER_SITE_OTHER): Payer: 59 | Admitting: Family Medicine

## 2013-05-12 VITALS — BP 132/90 | Temp 98.5°F | Wt 200.0 lb

## 2013-05-12 DIAGNOSIS — E669 Obesity, unspecified: Secondary | ICD-10-CM

## 2013-05-12 DIAGNOSIS — R7309 Other abnormal glucose: Secondary | ICD-10-CM

## 2013-05-12 DIAGNOSIS — L659 Nonscarring hair loss, unspecified: Secondary | ICD-10-CM

## 2013-05-12 DIAGNOSIS — R200 Anesthesia of skin: Secondary | ICD-10-CM

## 2013-05-12 DIAGNOSIS — R209 Unspecified disturbances of skin sensation: Secondary | ICD-10-CM

## 2013-05-12 DIAGNOSIS — I10 Essential (primary) hypertension: Secondary | ICD-10-CM

## 2013-05-12 DIAGNOSIS — G5711 Meralgia paresthetica, right lower limb: Secondary | ICD-10-CM

## 2013-05-12 DIAGNOSIS — R7303 Prediabetes: Secondary | ICD-10-CM

## 2013-05-12 DIAGNOSIS — N189 Chronic kidney disease, unspecified: Secondary | ICD-10-CM

## 2013-05-12 DIAGNOSIS — E785 Hyperlipidemia, unspecified: Secondary | ICD-10-CM

## 2013-05-12 DIAGNOSIS — G5710 Meralgia paresthetica, unspecified lower limb: Secondary | ICD-10-CM

## 2013-05-12 MED ORDER — GABAPENTIN 100 MG PO CAPS: 100.0000 mg | ORAL_CAPSULE | Freq: Every day | ORAL | Status: DC

## 2013-05-12 NOTE — Progress Notes (Signed)
Chief Complaint  Patient presents with  . 3 month rov  . Alopecia    HPI:  Follow up:  Low back pain and R leg numbness: - saw neurologist for R leg numbness pain going on per her reports for a few months to a few years -reports she wants leave of absence from work for this because she thinks work makes it worse - however she has been out of work for 1 week and is doing worse -neurologist gave dx meralgia paresthetica and advised loose fitting clothes -she saw ortho for this in [place] and had MRI with mild DDD without sig disc protrusion, spinal stenosis or nerve impingement  HTN: -increased coreg last visit and she was to see her nephrologist for her resistant HTN and CKI -meds: norvasc 10mg , coreg 25 bid, clonidine, furosimide -reports: doing well -denies: CP, swelling, palpitations -has follow up with nephrologist in march  Mild intermittent asthma: -stable  HLD: -on statin -stable  Diet controlled DM: -stable  Alopecia: -denies hair treatments of braiding recently -sudden over several weeks  ROS: See pertinent positives and negatives per HPI.  Past Medical History  Diagnosis Date  . Asthma   . GERD (gastroesophageal reflux disease)   . Hypertension   . High cholesterol   . Kidney disease   . Kidney stones   . History of blood transfusion   . Heavy menstrual bleeding   . Prediabetes   . Hyperlipemia 12/07/2012    Past Surgical History  Procedure Laterality Date  . Abdominal hysterectomy  2000  . Left shoulder surgery  08/2005  . Tubal ligation  11/1986  . Ectopic pregnancy surgery  02/1982    Family History  Problem Relation Age of Onset  . Hypertension Mother   . Breast cancer Maternal Aunt     History   Social History  . Marital Status: Married    Spouse Name: N/A    Number of Children: N/A  . Years of Education: N/A   Social History Main Topics  . Smoking status: Never Smoker   . Smokeless tobacco: None  . Alcohol Use: Yes     Comment:  occ - 1 drink rarely  . Drug Use: None  . Sexual Activity: None   Other Topics Concern  . 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      Home Situation:  Lives with husband and daughter and granddaughter      Spiritual Beliefs: baptist      Lifestyle: no regular exercise, poor diet             Current outpatient prescriptions:albuterol (PROAIR HFA) 108 (90 BASE) MCG/ACT inhaler, Inhale 2 puffs into the lungs every 6 (six) hours as needed for wheezing., Disp: , Rfl: ;  amLODipine (NORVASC) 10 MG tablet, Take 1 tablet (10 mg total) by mouth daily., Disp: 90 tablet, Rfl: 1;  atorvastatin (LIPITOR) 40 MG tablet, Take 1 tablet (40 mg total) by mouth daily., Disp: 90 tablet, Rfl: 1 carvedilol (COREG) 25 MG tablet, Take 25 mg by mouth 2 (two) times daily with a meal., Disp: , Rfl: ;  cloNIDine (CATAPRES - DOSED IN MG/24 HR) 0.2 mg/24hr patch, Place 1 patch (0.2 mg total) onto the skin once a week., Disp: 4 patch, Rfl: 5;  furosemide (LASIX) 40 MG tablet, Take 40 mg by mouth 2 (two) times daily., Disp: , Rfl: ;  gabapentin (NEURONTIN) 100 MG capsule, Take 1 capsule (  100 mg total) by mouth daily., Disp: 30 capsule, Rfl: 1  EXAM:  Filed Vitals:   05/12/13 1015  BP: 132/90  Temp: 98.5 F (36.9 C)    Body mass index is 36.57 kg/(m^2).  GENERAL: vitals reviewed and listed above, alert, oriented, appears well hydrated and in no acute distress  HEENT: atraumatic, conjunttiva clear, no obvious abnormalities on inspection of external nose and ears  NECK: no obvious masses on inspection  LUNGS: clear to auscultation bilaterally, no wheezes, rales or rhonchi, good air movement  CV: HRRR, no peripheral edema  MS: moves all extremities without noticeable abnormality, gait normal  PSYCH: pleasant and cooperative, no obvious depression or anxiety  ASSESSMENT AND PLAN:  Discussed the following assessment and plan:  Meralgia paresthetica of right  side - Plan: gabapentin (NEURONTIN) 100 MG capsule  Essential hypertension, benign  Prediabetes  Chronic kidney disease  Right leg numbness  Hyperlipemia  Obesity, unspecified  Alopecia  -advised sedentary lifestyle or stopping work tends to make chronic back pain and current dx worse and do not advise this -did offer trial of gabapentin for numbness and referral to back/pain specialist if pain in back so bad that she feels she can not work - however her pain actually has gotten worse this week with her not working -sudden alopecia and discussed etiologies and tx and she wishes to see derm - numbers given to call -cont current tx other chronic problems -Patient advised to return or notify a doctor immediately if symptoms worsen or persist or new concerns arise.  Patient Instructions  -start neurontin 100mg  daily and call in 1 week to let us know how this is working and if we need to increase or if you want to see a pain specialist  -see dermatologist for the hair loss  We recommend the following healthy lifestyle measures: - eat a healthy diet consisting of lots of vegetables, fruits, beans, nuts, seeds, healthy meats such as white chicken and fish and whole grains.  - avoid fried foods, fast food, processed foods, sodas, red meet and other fattening foods.  - get a least 150 minutes of aerobic exercise per week.   Follow up in: 1-2 months      Dolores Ewing R.

## 2013-05-12 NOTE — Progress Notes (Signed)
Pre visit review using our clinic review tool, if applicable. No additional management support is needed unless otherwise documented below in the visit note. 

## 2013-05-12 NOTE — Patient Instructions (Signed)
-  start neurontin 100mg  daily and call in 1 week to let us know how this is working and if we need to increase or if you want to see a pain specialist  -see dermatologist for the hair loss  We recommend the following healthy lifestyle measures: - eat a healthy diet consisting of lots of vegetables, fruits, beans, nuts, seeds, healthy meats such as white chicken and fish and whole grains.  - avoid fried foods, fast food, processed foods, sodas, red meet and other fattening foods.  - get a least 150 minutes of aerobic exercise per week.   Follow up in: 1-2 months

## 2013-07-10 ENCOUNTER — Ambulatory Visit: Payer: 59 | Admitting: Family Medicine

## 2013-10-19 ENCOUNTER — Encounter: Payer: Self-pay | Admitting: *Deleted

## 2013-12-25 ENCOUNTER — Encounter: Payer: Self-pay | Admitting: Internal Medicine

## 2013-12-25 ENCOUNTER — Ambulatory Visit: Payer: Self-pay | Attending: Internal Medicine | Admitting: Internal Medicine

## 2013-12-25 VITALS — BP 177/94 | HR 64 | Temp 98.6°F | Resp 18 | Ht 62.0 in | Wt 195.0 lb

## 2013-12-25 DIAGNOSIS — Z8249 Family history of ischemic heart disease and other diseases of the circulatory system: Secondary | ICD-10-CM | POA: Insufficient documentation

## 2013-12-25 DIAGNOSIS — R809 Proteinuria, unspecified: Secondary | ICD-10-CM | POA: Insufficient documentation

## 2013-12-25 DIAGNOSIS — R7309 Other abnormal glucose: Secondary | ICD-10-CM | POA: Insufficient documentation

## 2013-12-25 DIAGNOSIS — E780 Pure hypercholesterolemia: Secondary | ICD-10-CM | POA: Insufficient documentation

## 2013-12-25 DIAGNOSIS — N184 Chronic kidney disease, stage 4 (severe): Secondary | ICD-10-CM | POA: Insufficient documentation

## 2013-12-25 DIAGNOSIS — I129 Hypertensive chronic kidney disease with stage 1 through stage 4 chronic kidney disease, or unspecified chronic kidney disease: Secondary | ICD-10-CM | POA: Insufficient documentation

## 2013-12-25 DIAGNOSIS — Z23 Encounter for immunization: Secondary | ICD-10-CM | POA: Insufficient documentation

## 2013-12-25 DIAGNOSIS — I10 Essential (primary) hypertension: Secondary | ICD-10-CM | POA: Insufficient documentation

## 2013-12-25 DIAGNOSIS — Z880 Allergy status to penicillin: Secondary | ICD-10-CM | POA: Insufficient documentation

## 2013-12-25 DIAGNOSIS — Z888 Allergy status to other drugs, medicaments and biological substances status: Secondary | ICD-10-CM | POA: Insufficient documentation

## 2013-12-25 DIAGNOSIS — R51 Headache: Secondary | ICD-10-CM | POA: Insufficient documentation

## 2013-12-25 DIAGNOSIS — R200 Anesthesia of skin: Secondary | ICD-10-CM | POA: Insufficient documentation

## 2013-12-25 DIAGNOSIS — K219 Gastro-esophageal reflux disease without esophagitis: Secondary | ICD-10-CM | POA: Insufficient documentation

## 2013-12-25 DIAGNOSIS — E785 Hyperlipidemia, unspecified: Secondary | ICD-10-CM | POA: Insufficient documentation

## 2013-12-25 DIAGNOSIS — J45909 Unspecified asthma, uncomplicated: Secondary | ICD-10-CM | POA: Insufficient documentation

## 2013-12-25 DIAGNOSIS — Z79899 Other long term (current) drug therapy: Secondary | ICD-10-CM | POA: Insufficient documentation

## 2013-12-25 DIAGNOSIS — Z87442 Personal history of urinary calculi: Secondary | ICD-10-CM | POA: Insufficient documentation

## 2013-12-25 LAB — POCT URINALYSIS DIPSTICK
Bilirubin, UA: NEGATIVE
Glucose, UA: NEGATIVE
Ketones, UA: NEGATIVE
Leukocytes, UA: NEGATIVE
Nitrite, UA: NEGATIVE
Protein, UA: >=300
Spec Grav, UA: <=1.005
Urobilinogen, UA: 0.2
pH, UA: 5.5

## 2013-12-25 LAB — GLUCOSE, POCT (MANUAL RESULT ENTRY): POC Glucose: 119 mg/dl — AB (ref 70–99)

## 2013-12-25 LAB — POCT GLYCOSYLATED HEMOGLOBIN (HGB A1C): Hemoglobin A1C: 6.3

## 2013-12-25 MED ORDER — CLONIDINE HCL 0.2 MG/24HR TD PTWK: 0.2000 mg | MEDICATED_PATCH | TRANSDERMAL | Status: DC

## 2013-12-25 MED ORDER — ATORVASTATIN CALCIUM 40 MG PO TABS: 40.0000 mg | ORAL_TABLET | Freq: Every day | ORAL | Status: DC

## 2013-12-25 MED ORDER — AMLODIPINE BESYLATE 10 MG PO TABS: 10.0000 mg | ORAL_TABLET | Freq: Every day | ORAL | Status: DC

## 2013-12-25 MED ORDER — CARVEDILOL 25 MG PO TABS
25.0000 mg | ORAL_TABLET | Freq: Two times a day (BID) | ORAL | Status: DC
Start: 2013-12-25 — End: 2015-02-04

## 2013-12-25 MED ORDER — CLONIDINE HCL 0.1 MG PO TABS
0.1000 mg | ORAL_TABLET | Freq: Once | ORAL | Status: AC
  Administered 2013-12-25: 0.1 mg via ORAL

## 2013-12-25 MED ORDER — FUROSEMIDE 40 MG PO TABS: 40.0000 mg | ORAL_TABLET | Freq: Two times a day (BID) | ORAL | Status: DC

## 2013-12-25 NOTE — Patient Instructions (Signed)
Chronic Kidney Disease °Chronic kidney disease occurs when the kidneys are damaged over a long period. The kidneys are two organs that lie on either side of the spine between the middle of the back and the front of the abdomen. The kidneys:  °· Remove wastes and extra water from the blood.   °· Produce important hormones. These help keep bones strong, regulate blood pressure, and help create red blood cells.   °· Balance the fluids and chemicals in the blood and tissues. °A small amount of kidney damage may not cause problems, but a large amount of damage may make it difficult or impossible for the kidneys to work the way they should. If steps are not taken to slow down the kidney damage or stop it from getting worse, the kidneys may stop working permanently. Most of the time, chronic kidney disease does not go away. However, it can often be controlled, and those with the disease can usually live normal lives. °CAUSES  °The most common causes of chronic kidney disease are diabetes and high blood pressure (hypertension). Chronic kidney disease may also be caused by:  °· Diseases that cause the kidneys' filters to become inflamed.   °· Diseases that affect the immune system.   °· Genetic diseases.   °· Medicines that damage the kidneys, such as anti-inflammatory medicines.   °· Poisoning or exposure to toxic substances.   °· A reoccurring kidney or urinary infection.   °· A problem with urine flow. This may be caused by:   °¨ Cancer.   °¨ Kidney stones.   °¨ An enlarged prostate in males. °SIGNS AND SYMPTOMS  °Because the kidney damage in chronic kidney disease occurs slowly, symptoms develop slowly and may not be obvious until the kidney damage becomes severe. A person may have a kidney disease for years without showing any symptoms. Symptoms can include:  °· Swelling (edema) of the legs, ankles, or feet.   °· Tiredness (lethargy).   °· Nausea or vomiting.   °· Confusion.   °· Problems with urination, such as:    °¨ Decreased urine production.   °¨ Frequent urination, especially at night.   °¨ Frequent accidents in children who are potty trained.   °· Muscle twitches and cramps.   °· Shortness of breath.  °· Weakness.   °· Persistent itchiness.   °· Loss of appetite. °· Metallic taste in the mouth. °· Trouble sleeping. °· Slowed development in children. °· Short stature in children. °DIAGNOSIS  °Chronic kidney disease may be detected and diagnosed by tests, including blood, urine, imaging, or kidney biopsy tests.  °TREATMENT  °Most chronic kidney diseases cannot be cured. Treatment usually involves relieving symptoms and preventing or slowing the progression of the disease. Treatment may include:  °· A special diet. You may need to avoid alcohol and foods that are salty and high in potassium.   °· Medicines. These may:   °¨ Lower blood pressure.   °¨ Relieve anemia.   °¨ Relieve swelling.   °¨ Protect the bones. °HOME CARE INSTRUCTIONS  °· Follow your prescribed diet.   °· Take medicines only as directed by your health care provider. Do not take any new medicines (prescription, over-the-counter, or nutritional supplements) unless approved by your health care provider. Many medicines can worsen your kidney damage or need to have the dose adjusted.   °· Quit smoking if you smoke. Talk to your health care provider about a smoking cessation program.   °· Keep all follow-up visits as directed by your health care provider. °SEEK IMMEDIATE MEDICAL CARE IF: °· Your symptoms get worse or you develop new symptoms.   °· You develop symptoms of end-stage kidney disease. These   include:   °¨ Headaches.   °¨ Abnormally dark or light skin.   °¨ Numbness in the hands or feet.   °¨ Easy bruising.   °¨ Frequent hiccups.   °¨ Menstruation stops.   °· You have a fever.   °· You have decreased urine production.   °· You have pain or bleeding when urinating. °MAKE SURE YOU: °· Understand these instructions. °· Will watch your condition. °· Will  get help right away if you are not doing well or get worse. °FOR MORE INFORMATION  °· American Association of Kidney Patients: www.aakp.org °· National Kidney Foundation: www.kidney.org °· American Kidney Fund: www.akfinc.org °· [employer] [facility]: www.lifeoptions.org and www.kidneyschool.org °Document Released: 12/17/2007 Document Revised: 07/24/2013 Document Reviewed: 11/06/2011 °ExitCare® Patient Information ©2015 [employer]. This information is not intended to replace advice given to you by your health care provider. Make sure you discuss any questions you have with your health care provider. ° °

## 2013-12-25 NOTE — Progress Notes (Signed)
Patient ID: Rachael George, female   DOB: Mar 20, 1956, 58 y.o.   MRN: [id number]   [id number]  [id number]  DOB - 12-May-1955  CC:  Chief Complaint  Patient presents with  . Establish Care  . Hypertension  . Asthma  . Hyperlipidemia       HPI: Rachael George is a 58 y.o. female here today to establish medical care.  Patient has a past medical history of CKD stage 4, HTN, HLD, and prediabetes.  She has established care with a Nephrologist and is scheduled for a appointment this week.  She states that she is currently taking amlodipine, coreg, lasix, and lipitor. She states that her clonidine patch was switched to clonidine 0.2 mg TID but stopped the pills due to side effects. She reports that she was told if her GFR drops below 12 she will require dialysis. She reports that her kidneys have been damaged due to long standing hypertension. Patient reports that she gets a sharp pain over her right temple area that causes numbness.  She states that she has numbness over the whole right side of her face. She denies speech abnormalities or facial droop during these events.  This events have been occuring over the past couple of months with the last event occuring last weekend.     Patient has No headache, No chest pain, No abdominal pain - No Nausea, No new weakness tingling or numbness, No Cough - SOB.  Allergies  Allergen Reactions  . Atacand [Candesartan] Anaphylaxis  . Lisinopril Anaphylaxis  . Motrin [Ibuprofen] Anaphylaxis  . Nsaids Anaphylaxis  . Penicillins Rash   Past Medical History  Diagnosis Date  . Asthma   . GERD (gastroesophageal reflux disease)   . Hypertension   . High cholesterol   . Kidney disease   . Kidney stones   . History of blood transfusion   . Heavy menstrual bleeding   . Prediabetes   . Hyperlipemia 12/07/2012   Current Outpatient Prescriptions on File Prior to Visit  Medication Sig Dispense Refill  . albuterol (PROAIR HFA) 108 (90 BASE) MCG/ACT  inhaler Inhale 2 puffs into the lungs every 6 (six) hours as needed for wheezing.      Marland Kitchen amLODipine (NORVASC) 10 MG tablet Take 1 tablet (10 mg total) by mouth daily.  90 tablet  1  . atorvastatin (LIPITOR) 40 MG tablet Take 1 tablet (40 mg total) by mouth daily.  90 tablet  1  . carvedilol (COREG) 25 MG tablet Take 25 mg by mouth 2 (two) times daily with a meal.      . furosemide (LASIX) 40 MG tablet Take 40 mg by mouth 2 (two) times daily.      . cloNIDine (CATAPRES - DOSED IN MG/24 HR) 0.2 mg/24hr patch Place 1 patch (0.2 mg total) onto the skin once a week.  4 patch  5  . gabapentin (NEURONTIN) 100 MG capsule Take 1 capsule (100 mg total) by mouth daily.  30 capsule  1   No current facility-administered medications on file prior to visit.   Family History  Problem Relation Age of Onset  . Hypertension Mother   . Breast cancer Maternal Aunt    History   Social History  . Marital Status: Married    Spouse Name: N/A    Number of Children: N/A  . Years of Education: N/A   Occupational History  . Not on file.   Social History Main Topics  . Smoking status: Never Smoker   .  Smokeless tobacco: Not on file  . Alcohol Use: Yes     Comment: occ - 1 drink rarely  . Drug Use: Not on file  . Sexual Activity: Not on file   Other Topics Concern  . 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  Eyes: Positive for blurred vision.  Respiratory: Negative.   Cardiovascular: Positive for palpitations and leg swelling (right knee). Negative for chest pain and claudication.  Gastrointestinal: Negative.   Genitourinary: Negative.   Musculoskeletal: Positive for joint pain.  Neurological: Positive for tingling  and headaches (pain in temple-with numbness). Negative for dizziness.       Hx of meralgia        Objective:   Filed Vitals:   12/25/13 1623  BP: 177/94  Pulse: 64  Temp: 98.6 F (37 C)  Resp: 18    Physical Exam: Constitutional: Patient appears well-developed and well-nourished. No distress. HENT: Normocephalic, atraumatic, External right and left ear normal. Oropharynx is clear and moist.  Eyes: Conjunctivae and EOM are normal. PERRLA, no scleral icterus. Neck: Normal ROM. Neck supple. No JVD. No tracheal deviation. No thyromegaly. CVS: RRR, S1/S2 +, no murmurs, no gallops, no carotid bruit.  Pulmonary: Effort and breath sounds normal, no stridor, rhonchi, wheezes, rales.  Abdominal: Soft. BS +, no distension, tenderness, rebound or guarding.  Musculoskeletal: Normal range of motion. No edema and no tenderness.  Lymphadenopathy: No lymphadenopathy noted, cervical Neuro: Alert. Normal reflexes, muscle tone coordination. No cranial nerve deficit. Skin: Skin is warm and dry. No rash noted. Not diaphoretic. No erythema. No pallor. Psychiatric: Normal mood and affect. Behavior, judgment, thought content normal.  No results found for this basename: WBC, HGB, HCT, MCV, PLT   Lab Results  Component Value Date   CREATININE 2.9* 04/05/2013   BUN 51* 04/05/2013   NA 140 04/05/2013   K 3.2* 04/05/2013   CL 106 04/05/2013   CO2 27 04/05/2013    Lab Results  Component Value Date   HGBA1C 6.5 12/12/2012   Lipid Panel     Component Value Date/Time   CHOL 215* 04/05/2013 0850   TRIG 146.0 04/05/2013 0850   HDL 40.30 04/05/2013 0850   CHOLHDL 5 04/05/2013 0850   VLDL 29.2 04/05/2013 0850       Assessment and plan:   Rachael George was seen today for establish care, hypertension, asthma and hyperlipidemia.  Diagnoses and associated orders for this visit:  Accelerated hypertension - Urinalysis Dipstick - cloNIDine (CATAPRES) tablet 0.1 mg; Take 1 tablet (0.1 mg total) by mouth  once. - HgB A1c - Glucose (CBG) - Refill amLODipine (NORVASC) 10 MG tablet; Take 1 tablet (10 mg total) by mouth daily. - Refill carvedilol (COREG) 25 MG tablet; Take 1 tablet (25 mg total) by mouth 2 (two) times daily with a meal. -  Begin cloNIDine (CATAPRES - DOSED IN MG/24 HR) 0.2 mg/24hr patch; Place 1 patch (0.2 mg total) onto the skin once a week.  Will be apart of PASS program.  - Refill furosemide (LASIX) 40 MG tablet; Take 1 tablet (40 mg total) by mouth 2 (two)  times daily.  Proteinuria  Chronic kidney disease, stage 4 (severe) Continue care with current Nephrologist.  Call office when you find out which labs you need for that appointment and we will do here and send to Nephrologist office  HLD (hyperlipidemia) - Continue atorvastatin (LIPITOR) 40 MG tablet; Take 1 tablet (40 mg total) by mouth daily.  Encounter for immunization - Pneumococcal polysaccharide vaccine 23-valent greater than or equal to 2yo subcutaneous/IM    Will schedule patient for CT of head for pain with facial weakness and colonoscopy once she receives the hospital discount   Return in about 3 months (around 03/27/2014) for HTN and PAP  The patient was given clear instructions to go to ER or return to medical center if symptoms don't improve, worsen or new problems develop. The patient verbalized understanding. Patient was advised on symptoms that should warrant immediate ER evaluation.   Chari Manning, [facility] [phone] 12/25/2013, 4:34 PM

## 2013-12-25 NOTE — Progress Notes (Signed)
Pt here to establish care for Hx Accelerated HTN,Chronic Kidney disease with medical management Pt has been no compliant with care due to loss of job/insurance. Pt was being seen by [facility] Pt didn't take meds today C/o headache right temporal area radiating to jaw pan intermit Denies chest pain,sob or dizziness BP- 177/94 62 Clonidine 0.1 mg tab given per protocol Urine dipstick obtained Requesting flu vaccine/GI referral

## 2013-12-26 ENCOUNTER — Ambulatory Visit: Payer: 59 | Attending: Internal Medicine

## 2013-12-27 ENCOUNTER — Other Ambulatory Visit: Payer: Self-pay | Admitting: Internal Medicine

## 2013-12-27 ENCOUNTER — Ambulatory Visit: Payer: 59 | Attending: Internal Medicine

## 2013-12-27 DIAGNOSIS — E349 Endocrine disorder, unspecified: Secondary | ICD-10-CM

## 2013-12-27 DIAGNOSIS — R7989 Other specified abnormal findings of blood chemistry: Secondary | ICD-10-CM

## 2013-12-27 DIAGNOSIS — N189 Chronic kidney disease, unspecified: Secondary | ICD-10-CM

## 2013-12-27 DIAGNOSIS — D631 Anemia in chronic kidney disease: Secondary | ICD-10-CM

## 2013-12-27 DIAGNOSIS — N184 Chronic kidney disease, stage 4 (severe): Secondary | ICD-10-CM

## 2013-12-28 LAB — COMPLETE METABOLIC PANEL WITH GFR
ALT: 11 U/L (ref 0–35)
AST: 15 U/L (ref 0–37)
Albumin: 3.5 g/dL (ref 3.5–5.2)
Alkaline Phosphatase: 188 U/L — ABNORMAL HIGH (ref 39–117)
BUN: 45 mg/dL — ABNORMAL HIGH (ref 6–23)
CO2: 24 mEq/L (ref 19–32)
Calcium: 8.8 mg/dL (ref 8.4–10.5)
Chloride: 105 mEq/L (ref 96–112)
Creat: 3.16 mg/dL — ABNORMAL HIGH (ref 0.50–1.10)
GFR, Est African American: 18 mL/min — ABNORMAL LOW
GFR, Est Non African American: 16 mL/min — ABNORMAL LOW
Glucose, Bld: 83 mg/dL (ref 70–99)
Potassium: 3.7 mEq/L (ref 3.5–5.3)
Sodium: 142 mEq/L (ref 135–145)
Total Bilirubin: 0.3 mg/dL (ref 0.2–1.2)
Total Protein: 6.6 g/dL (ref 6.0–8.3)

## 2013-12-28 LAB — CBC
HCT: 33.1 % — ABNORMAL LOW (ref 36.0–46.0)
Hemoglobin: 10.7 g/dL — ABNORMAL LOW (ref 12.0–15.0)
MCH: 23.6 pg — ABNORMAL LOW (ref 26.0–34.0)
MCHC: 32.3 g/dL (ref 30.0–36.0)
MCV: 73.1 fL — ABNORMAL LOW (ref 78.0–100.0)
Platelets: 167 10*3/uL (ref 150–400)
RBC: 4.53 MIL/uL (ref 3.87–5.11)
RDW: 16.9 % — ABNORMAL HIGH (ref 11.5–15.5)
WBC: 6.8 10*3/uL (ref 4.0–10.5)

## 2013-12-28 LAB — LIPID PANEL
Cholesterol: 295 mg/dL — ABNORMAL HIGH (ref 0–200)
HDL: 38 mg/dL — ABNORMAL LOW (ref >39)
LDL Cholesterol: 202 mg/dL — ABNORMAL HIGH (ref 0–99)
Total CHOL/HDL Ratio: 7.8 Ratio
Triglycerides: 274 mg/dL — ABNORMAL HIGH (ref <150)
VLDL: 55 mg/dL — ABNORMAL HIGH (ref 0–40)

## 2013-12-28 LAB — FERRITIN: Ferritin: 687 ng/mL — ABNORMAL HIGH (ref 10–291)

## 2013-12-28 LAB — RENAL FUNCTION PANEL
Albumin: 3.5 g/dL (ref 3.5–5.2)
BUN: 45 mg/dL — ABNORMAL HIGH (ref 6–23)
CO2: 24 mEq/L (ref 19–32)
Calcium: 8.8 mg/dL (ref 8.4–10.5)
Chloride: 105 mEq/L (ref 96–112)
Creat: 3.16 mg/dL — ABNORMAL HIGH (ref 0.50–1.10)
Glucose, Bld: 83 mg/dL (ref 70–99)
Phosphorus: 3.7 mg/dL (ref 2.3–4.6)
Potassium: 3.7 mEq/L (ref 3.5–5.3)
Sodium: 142 mEq/L (ref 135–145)

## 2013-12-28 LAB — IRON AND TIBC
%SAT: 15 % — ABNORMAL LOW (ref 20–55)
Iron: 36 ug/dL — ABNORMAL LOW (ref 42–145)
TIBC: 233 ug/dL — ABNORMAL LOW (ref 250–470)
UIBC: 197 ug/dL (ref 125–400)

## 2013-12-28 LAB — PARATHYROID HORMONE, INTACT (NO CA): PTH: 321 pg/mL — ABNORMAL HIGH (ref 14–64)

## 2014-01-04 ENCOUNTER — Ambulatory Visit: Payer: 59 | Attending: Internal Medicine

## 2014-01-04 ENCOUNTER — Telehealth: Payer: Self-pay | Admitting: *Deleted

## 2014-01-04 NOTE — Progress Notes (Unsigned)
Pt in clinic for BP Check, requested lab results to be fax to Dr Elizebeth Brooking at [facility]  BP improving since last visit, advice to continue taking medication as PCP stated, low sodium and exercise  BP 144/81

## 2014-01-04 NOTE — Progress Notes (Unsigned)
Patient ID: Rachael George, female   DOB: [birth year], 58 y.o.   MRN: [id number]

## 2014-01-04 NOTE — Telephone Encounter (Signed)
Lab results were fax Per Chari Manning to [facility]

## 2014-01-15 ENCOUNTER — Ambulatory Visit: Payer: Self-pay | Attending: Internal Medicine

## 2014-01-31 ENCOUNTER — Encounter (HOSPITAL_COMMUNITY): Payer: Self-pay | Admitting: *Deleted

## 2014-01-31 ENCOUNTER — Emergency Department (HOSPITAL_COMMUNITY)
Admission: EM | Admit: 2014-01-31 | Discharge: 2014-01-31 | Disposition: A | Discharge status: Home / Self Care | Payer: 59 | Attending: Emergency Medicine | Admitting: Emergency Medicine

## 2014-01-31 DIAGNOSIS — Z79899 Other long term (current) drug therapy: Secondary | ICD-10-CM | POA: Insufficient documentation

## 2014-01-31 DIAGNOSIS — Z8719 Personal history of other diseases of the digestive system: Secondary | ICD-10-CM | POA: Insufficient documentation

## 2014-01-31 DIAGNOSIS — J45909 Unspecified asthma, uncomplicated: Secondary | ICD-10-CM | POA: Insufficient documentation

## 2014-01-31 DIAGNOSIS — Z9851 Tubal ligation status: Secondary | ICD-10-CM | POA: Insufficient documentation

## 2014-01-31 DIAGNOSIS — I129 Hypertensive chronic kidney disease with stage 1 through stage 4 chronic kidney disease, or unspecified chronic kidney disease: Secondary | ICD-10-CM | POA: Insufficient documentation

## 2014-01-31 DIAGNOSIS — N189 Chronic kidney disease, unspecified: Secondary | ICD-10-CM | POA: Insufficient documentation

## 2014-01-31 DIAGNOSIS — E780 Pure hypercholesterolemia: Secondary | ICD-10-CM | POA: Insufficient documentation

## 2014-01-31 DIAGNOSIS — Z9079 Acquired absence of other genital organ(s): Secondary | ICD-10-CM | POA: Insufficient documentation

## 2014-01-31 DIAGNOSIS — Z880 Allergy status to penicillin: Secondary | ICD-10-CM | POA: Insufficient documentation

## 2014-01-31 MED ORDER — PREDNISONE 20 MG PO TABS: ORAL_TABLET | ORAL | Status: DC

## 2014-01-31 MED ORDER — OXYCODONE-ACETAMINOPHEN 5-325 MG PO TABS: 1.0000 | ORAL_TABLET | Freq: Four times a day (QID) | ORAL | Status: DC | PRN

## 2014-01-31 NOTE — ED Provider Notes (Signed)
CSN: [id number]     Arrival date & time 01/31/14  1546 History  This chart was scribed for Carlisle Cater, PA-C working with Debby Freiberg, MD by Randa Evens, ED Scribe. This patient was seen in room TR10C/TR10C and the patient's care was started at 4:06 PM.    Chief Complaint  Patient presents with  . Foot Pain   Patient is a 58 y.o. female presenting with lower extremity pain. The history is provided by the patient. No language interpreter was used.  Foot Pain   HPI Comments: Rachael George is a 58 y.o. female with PMHx of kidney disease, asthma, HTN, and prediabetes who presents to the Emergency Department complaining of right foot pain onset 4 days ago. She states she has associated swelling and mild pain. She states she has been soaking the foot in epsom salt and warm water with no relief. She states she has a Hx of gout and that this feels similar to prior episodes. She denies injury or trauma to the right foot. No fever, N/V. States she had elevated uric acid test in past. Last time she had gout it gradually went away and she saw PCP after resolution.  Past Medical History  Diagnosis Date  . Asthma   . GERD (gastroesophageal reflux disease)   . Hypertension   . High cholesterol   . Kidney disease   . Kidney stones   . History of blood transfusion   . Heavy menstrual bleeding   . Prediabetes   . Hyperlipemia 12/07/2012   Past Surgical History  Procedure Laterality Date  . Abdominal hysterectomy  2000  . Left shoulder surgery  08/2005  . Tubal ligation  11/1986  . Ectopic pregnancy surgery  02/1982   Family History  Problem Relation Age of Onset  . Hypertension Mother   . Breast cancer Maternal Aunt    History  Substance Use Topics  . Smoking status: Never Smoker   . Smokeless tobacco: Not on file  . Alcohol Use: Yes     Comment: occ - 1 drink rarely   OB History    No data available     Review of Systems  Constitutional: Negative for activity change.   Musculoskeletal: Positive for joint swelling, arthralgias and gait problem. Negative for back pain and neck pain.  Skin: Negative for wound.  Neurological: Negative for weakness and numbness.    Allergies  Atacand; Lisinopril; Motrin; Nsaids; and Penicillins  Home Medications   Prior to Admission medications   Medication Sig Start Date End Date Taking? Authorizing Provider  albuterol (PROAIR HFA) 108 (90 BASE) MCG/ACT inhaler Inhale 2 puffs into the lungs every 6 (six) hours as needed for wheezing.    Historical Provider, MD  amLODipine (NORVASC) 10 MG tablet Take 1 tablet (10 mg total) by mouth daily. 12/25/13   Lance Bosch, NP  atorvastatin (LIPITOR) 40 MG tablet Take 1 tablet (40 mg total) by mouth daily. 12/25/13   Lance Bosch, NP  carvedilol (COREG) 25 MG tablet Take 1 tablet (25 mg total) by mouth 2 (two) times daily with a meal. 12/25/13   Lance Bosch, NP  cloNIDine (CATAPRES - DOSED IN MG/24 HR) 0.2 mg/24hr patch Place 1 patch (0.2 mg total) onto the skin once a week. 12/25/13   Lance Bosch, NP  furosemide (LASIX) 40 MG tablet Take 1 tablet (40 mg total) by mouth 2 (two) times daily. 12/25/13   Lance Bosch, NP  gabapentin (NEURONTIN) 100 MG  capsule Take 1 capsule (100 mg total) by mouth daily. 05/12/13   Lucretia Kern, DO   Triage Vitals: BP 175/103 mmHg  Pulse 78  Temp(Src) 98.4 F (36.9 C)  Resp 18  Ht 5\' 2"  (1.575 m)  Wt 197 lb (89.359 kg)  BMI 36.02 kg/m2  SpO2 97%  Physical Exam  Constitutional: She appears well-developed and well-nourished. No distress.  HENT:  Head: Normocephalic and atraumatic.  Eyes: Conjunctivae and EOM are normal. Pupils are equal, round, and reactive to light.  Neck: Normal range of motion. Neck supple. No tracheal deviation present.  Cardiovascular: Normal rate.  Exam reveals no decreased pulses.   Pulses:      Dorsalis pedis pulses are 2+ on the right side, and 2+ on the left side.       Posterior tibial pulses are 2+ on the  right side, and 2+ on the left side.  Pulmonary/Chest: Effort normal. No respiratory distress.  Musculoskeletal: She exhibits tenderness. She exhibits no edema.       Right knee: Normal.       Right ankle: Normal. No tenderness.       Right foot: There is decreased range of motion, tenderness, bony tenderness and swelling.       Feet:  Neurological: She is alert. No sensory deficit.  Motor, sensation, and vascular distal to the injury is fully intact.   Skin: Skin is warm and dry.  Psychiatric: She has a normal mood and affect. Her behavior is normal.  Nursing note and vitals reviewed.   ED Course  Procedures (including critical care time) DIAGNOSTIC STUDIES: Oxygen Saturation is 97% on RA, normal by my interpretation.    COORDINATION OF CARE: 4:10 PM-Discussed treatment plan which includes medications for pain and swelling with pt at bedside and pt agreed to plan.     Labs Review Labs Reviewed - No data to display  Imaging Review No results found.   EKG Interpretation None       Patient seen and examined.   Vital signs reviewed and are as follows: BP 175/103 mmHg  Pulse 78  Temp(Src) 98.4 F (36.9 C)  Resp 18  Ht 5\' 2"  (1.575 m)  Wt 197 lb (89.359 kg)  BMI 36.02 kg/m2  SpO2 97%  Will treat with prednisone and percocet. I reviewed precautions on prednisone with renal disease on up-to-date. No dosing change necessary. May cause some fluid retention.   Urged PCP f/u to ensure resolution. Patient with CKD and anyphalaxis to NSAIDs so must avoid.   MDM   Final diagnoses:  Chronic kidney disease, unspecified stage   Pt with s/s consistent with gouty arthritis. No signs of infection or septic arthritis.     I personally performed the services described in this documentation, which was scribed in my presence. The recorded information has been reviewed and is accurate.      Carlisle Cater, PA-C 01/31/14 1635  Debby Freiberg, MD 02/01/14 2106

## 2014-01-31 NOTE — Discharge Instructions (Signed)
Please read and follow all provided instructions.  Your diagnoses today include:  1. Chronic kidney disease, unspecified stage    Tests performed today include:  Vital signs. See below for your results today.   Medications prescribed:   Prednisone - steroid medicine   It is best to take this medication in the morning to prevent sleeping problems. If you are diabetic, monitor your blood sugar closely and stop taking Prednisone if blood sugar is over 300. Take with food to prevent stomach upset.    Percocet (oxycodone/acetaminophen) - narcotic pain medication  DO NOT drive or perform any activities that require you to be awake and alert because this medicine can make you drowsy. BE VERY CAREFUL not to take multiple medicines containing Tylenol (also called acetaminophen). Doing so can lead to an overdose which can damage your liver and cause liver failure and possibly death.  Take any prescribed medications only as directed.  Home care instructions:   Follow any educational materials contained in this packet  Follow R.I.C.E. Protocol:  R - rest your injury   I  - use ice on injury without applying directly to skin  C - compress injury with bandage or splint  E - elevate the injury as much as possible  Follow-up instructions: Please follow-up with your primary care provider if you continue to have significant pain or trouble walking in 1 week. In this case you may have a severe injury that requires further care.   Return instructions:   Please return to the Emergency Department if you experience worsening symptoms.   Please return if you have any other emergent concerns.  Additional Information:  Your vital signs today were: BP 175/103 mmHg   Pulse 78   Temp(Src) 98.4 F (36.9 C)   Resp 18   Ht 5\' 2"  (1.575 m)   Wt 197 lb (89.359 kg)   BMI 36.02 kg/m2   SpO2 97% If your blood pressure (BP) was elevated above 135/85 this visit, please have this repeated by your doctor  within one month. --------------

## 2014-01-31 NOTE — ED Notes (Signed)
Pt c/o rt foot pain and swelling since Sunday.  No known injury.  The pt has had gout in the past and this feels like the same

## 2014-04-02 ENCOUNTER — Telehealth: Payer: Self-pay | Admitting: *Deleted

## 2014-04-02 ENCOUNTER — Telehealth: Payer: Self-pay | Admitting: Internal Medicine

## 2014-04-02 NOTE — Telephone Encounter (Signed)
Left voice message to return call 

## 2014-04-02 NOTE — Telephone Encounter (Signed)
Patient would like to speak to nurse about blood pressure medication not working. Please f/u with pt.

## 2014-04-02 NOTE — Telephone Encounter (Signed)
Pt called to make appointment with lab. Has order form Her Kidney doctor

## 2014-04-03 ENCOUNTER — Ambulatory Visit: Payer: 59 | Attending: Internal Medicine

## 2014-04-03 ENCOUNTER — Encounter: Payer: 59 | Admitting: Internal Medicine

## 2014-04-03 ENCOUNTER — Other Ambulatory Visit: Payer: Self-pay | Admitting: Nephrology

## 2014-04-04 LAB — PTH, INTACT AND CALCIUM
Calcium: 8.9 mg/dL (ref 8.4–10.5)
PTH: 268 pg/mL — ABNORMAL HIGH (ref 14–64)

## 2014-04-04 LAB — RENAL FUNCTION PANEL
Albumin: 3.6 g/dL (ref 3.5–5.2)
BUN: 52 mg/dL — ABNORMAL HIGH (ref 6–23)
CO2: 24 mEq/L (ref 19–32)
Calcium: 9.3 mg/dL (ref 8.4–10.5)
Chloride: 105 mEq/L (ref 96–112)
Creat: 3.4 mg/dL — ABNORMAL HIGH (ref 0.50–1.10)
Glucose, Bld: 95 mg/dL (ref 70–99)
Phosphorus: 3.9 mg/dL (ref 2.3–4.6)
Potassium: 4 mEq/L (ref 3.5–5.3)
Sodium: 142 mEq/L (ref 135–145)

## 2014-04-04 LAB — IRON AND TIBC
%SAT: 16 % — ABNORMAL LOW (ref 20–55)
Iron: 40 ug/dL — ABNORMAL LOW (ref 42–145)
TIBC: 248 ug/dL — ABNORMAL LOW (ref 250–470)
UIBC: 208 ug/dL (ref 125–400)

## 2014-04-04 LAB — HEMOGLOBIN: Hemoglobin: 10.5 g/dL — ABNORMAL LOW (ref 12.0–15.0)

## 2014-04-04 LAB — FERRITIN: Ferritin: 755 ng/mL — ABNORMAL HIGH (ref 10–291)

## 2014-04-10 ENCOUNTER — Other Ambulatory Visit (HOSPITAL_COMMUNITY)
Admission: RE | Admit: 2014-04-10 | Discharge: 2014-04-10 | Disposition: A | Discharge status: Home / Self Care | Payer: 59 | Source: Ambulatory Visit | Attending: Internal Medicine | Admitting: Internal Medicine

## 2014-04-10 ENCOUNTER — Ambulatory Visit: Payer: 59 | Attending: Internal Medicine | Admitting: Internal Medicine

## 2014-04-10 ENCOUNTER — Encounter: Payer: Self-pay | Admitting: Internal Medicine

## 2014-04-10 VITALS — BP 148/91 | HR 74 | Temp 98.3°F | Resp 16 | Ht 62.0 in | Wt 197.0 lb

## 2014-04-10 DIAGNOSIS — Z0000 Encounter for general adult medical examination without abnormal findings: Secondary | ICD-10-CM | POA: Insufficient documentation

## 2014-04-10 DIAGNOSIS — I129 Hypertensive chronic kidney disease with stage 1 through stage 4 chronic kidney disease, or unspecified chronic kidney disease: Secondary | ICD-10-CM | POA: Insufficient documentation

## 2014-04-10 DIAGNOSIS — L304 Erythema intertrigo: Secondary | ICD-10-CM | POA: Insufficient documentation

## 2014-04-10 DIAGNOSIS — J45909 Unspecified asthma, uncomplicated: Secondary | ICD-10-CM | POA: Insufficient documentation

## 2014-04-10 DIAGNOSIS — E785 Hyperlipidemia, unspecified: Secondary | ICD-10-CM | POA: Insufficient documentation

## 2014-04-10 DIAGNOSIS — Z113 Encounter for screening for infections with a predominantly sexual mode of transmission: Secondary | ICD-10-CM | POA: Insufficient documentation

## 2014-04-10 DIAGNOSIS — N760 Acute vaginitis: Secondary | ICD-10-CM | POA: Insufficient documentation

## 2014-04-10 MED ORDER — CLOTRIMAZOLE 1 % EX CREA: 1.0000 "application " | TOPICAL_CREAM | Freq: Two times a day (BID) | CUTANEOUS | Status: DC

## 2014-04-10 NOTE — Patient Instructions (Signed)

## 2014-04-10 NOTE — Progress Notes (Signed)
Patient ID: Rachael George, female   DOB: 1955-03-29, 59 y.o.   MRN: [id number]  CC: physical  HPI: Rachael George is a 59 y.o. female here today for a follow up visit.  Patient has past medical history of severe CKD, HLD, GERD, accelerated HTN, and asthma. She presents to clinic today for a physical and pap smear. She reports that she has been out of her clonidine patch for 2 weeks because it of the cost of medication.  She reports that her nephrologist gave her Monoxidil 2.5 mg BID in place of the clonidine.  She states that she has had several high blood pressures since she has been out of the patch. Denies vaginal discharge, odor, or lesions. She has itching in her groin.      Patient has No headache, No chest pain, No abdominal pain - No Nausea, No new weakness tingling or numbness, No Cough - SOB.  Allergies  Allergen Reactions  . Atacand [Candesartan] Anaphylaxis  . Lisinopril Anaphylaxis  . Motrin [Ibuprofen] Anaphylaxis  . Nsaids Anaphylaxis  . Penicillins Rash   Past Medical History  Diagnosis Date  . Asthma   . GERD (gastroesophageal reflux disease)   . Hypertension   . High cholesterol   . Kidney disease   . Kidney stones   . History of blood transfusion   . Heavy menstrual bleeding   . Prediabetes   . Hyperlipemia 12/07/2012   Current Outpatient Prescriptions on File Prior to Visit  Medication Sig Dispense Refill  . albuterol (PROAIR HFA) 108 (90 BASE) MCG/ACT inhaler Inhale 2 puffs into the lungs every 6 (six) hours as needed for wheezing.    Marland Kitchen amLODipine (NORVASC) 10 MG tablet Take 1 tablet (10 mg total) by mouth daily. 90 tablet 1  . atorvastatin (LIPITOR) 40 MG tablet Take 1 tablet (40 mg total) by mouth daily. 90 tablet 1  . carvedilol (COREG) 25 MG tablet Take 1 tablet (25 mg total) by mouth 2 (two) times daily with a meal. 60 tablet 3  . furosemide (LASIX) 40 MG tablet Take 1 tablet (40 mg total) by mouth 2 (two) times daily. 60 tablet 3  . predniSONE  (DELTASONE) 20 MG tablet 3 Tabs PO Days 1-3, then 2 tabs PO Days 4-6, then 1 tab PO Day 7-9, then Half Tab PO Day 10-12 20 tablet 0  . cloNIDine (CATAPRES - DOSED IN MG/24 HR) 0.2 mg/24hr patch Place 1 patch (0.2 mg total) onto the skin once a week. (Patient not taking: Reported on 04/10/2014) 4 patch 5  . gabapentin (NEURONTIN) 100 MG capsule Take 1 capsule (100 mg total) by mouth daily. (Patient not taking: Reported on 04/10/2014) 30 capsule 1  . oxyCODONE-acetaminophen (PERCOCET/ROXICET) 5-325 MG per tablet Take 1 tablet by mouth every 6 (six) hours as needed for severe pain. (Patient not taking: Reported on 04/10/2014) 12 tablet 0   No current facility-administered medications on file prior to visit.   Family History  Problem Relation Age of Onset  . Hypertension Mother   . Breast cancer Maternal Aunt    History   Social History  . Marital Status: Married    Spouse Name: N/A    Number of Children: N/A  . Years of Education: N/A   Occupational History  . Not on file.   Social History Main Topics  . Smoking status: Never Smoker   . Smokeless tobacco: Not on file  . Alcohol Use: Yes     Comment: occ - 1 drink  rarely  . Drug Use: Not on file  . Sexual Activity: Not on file   Other Topics Concern  . 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 HENT: Negative for ear pain, nosebleeds, congestion, facial swelling, rhinorrhea, neck pain, neck stiffness and ear discharge.  Eyes: Negative for pain, discharge, redness, itching and visual disturbance. Respiratory: Negative for cough, choking, chest tightness, shortness of breath, wheezing and stridor.   Cardiovascular: Negative for chest pain, palpitations and leg swelling. Gastrointestinal: Negative for abdominal distention. Genitourinary: Negative for dysuria, urgency, frequency, hematuria, flank pain, decreased urine volume, difficulty urinating and dyspareunia.  Musculoskeletal: Negative for back pain, joint swelling, arthralgias and gait problem. Neurological: Negative for dizziness, tremors, seizures, syncope, facial asymmetry, speech difficulty, weakness, light-headedness, numbness and headaches.  Hematological: Negative for adenopathy. Does not bruise/bleed easily. Psychiatric/Behavioral: Negative for hallucinations, behavioral problems, confusion, dysphoric mood, decreased concentration and agitation.    Objective:   Filed Vitals:   04/10/14 1513  BP: 148/91  Pulse: 74  Temp: 98.3 F (36.8 C)  Resp: 16    Physical Exam: Constitutional: Patient appears well-developed and well-nourished. No distress. HENT: Normocephalic, atraumatic, External right and left ear normal. Oropharynx is clear and moist.  Eyes: Conjunctivae and EOM are normal. PERRLA, no scleral icterus. Neck: Normal ROM. Neck supple. No JVD. No tracheal deviation. No thyromegaly. CVS: RRR, S1/S2 +, no murmurs, no gallops, no carotid bruit.  Pulmonary: Effort and breath sounds normal, no stridor, rhonchi, wheezes, rales.  Abdominal: Soft. BS +,  no distension, tenderness, rebound or guarding.  Musculoskeletal: Normal range of motion. No edema and no tenderness.  Lymphadenopathy: No lymphadenopathy noted, cervical, inguinal or axillary Neuro: Alert. Normal reflexes, muscle tone coordination. No cranial nerve deficit. Skin: Skin is warm and dry. No rash noted. Not diaphoretic. No erythema. No pallor. Psychiatric: Normal mood and affect. Behavior, judgment, thought content normal.  Physical Exam  Pulmonary/Chest: Right breast exhibits no mass. Left breast exhibits no mass.  Genitourinary: Vagina normal. No breast  tenderness or discharge.  No cervix found  Lymphadenopathy:       Right: No inguinal adenopathy present.       Left: No inguinal adenopathy present.     Lab Results  Component Value Date   WBC 6.8 12/27/2013   HGB 10.5* 04/03/2014   HCT 33.1* 12/27/2013   MCV 73.1* 12/27/2013   PLT 167 12/27/2013   Lab Results  Component Value Date   CREATININE 3.40* 04/03/2014   BUN 52* 04/03/2014   NA 142 04/03/2014   K 4.0 04/03/2014   CL 105 04/03/2014   CO2 24 04/03/2014    Lab Results  Component Value Date   HGBA1C 6.3 12/25/2013   Lipid Panel     Component Value Date/Time   CHOL 295* 12/27/2013 1129   TRIG 274* 12/27/2013 1129   HDL 38* 12/27/2013 1129   CHOLHDL 7.8 12/27/2013 1129   VLDL 55* 12/27/2013 1129   LDLCALC 202* 12/27/2013 1129       Assessment and plan:   Filomena was seen today for follow-up.  Diagnoses  and associated orders for this visit:  HLD (hyperlipidemia) - Lipid panel; Future  Annual physical exam - MM Digital Screening; Future - HIV antibody (with reflex); Future - RPR; Future - Cytology - PAP [place] - Cervicovaginal ancillary only  Intertrigo - clotrimazole (LOTRIMIN) 1 % cream; Apply 1 application topically 2 (two) times daily.   Return in about 1 week (around 04/17/2014) for Lab Visit-lipid and 6 mo PCP.       Chari Manning, NP-C [facility] [phone] 04/10/2014, 3:16 PM

## 2014-04-10 NOTE — Progress Notes (Signed)
Pt is here today for a physical and a pap smear.

## 2014-04-11 ENCOUNTER — Other Ambulatory Visit: Payer: Self-pay | Admitting: Internal Medicine

## 2014-04-11 DIAGNOSIS — Z1231 Encounter for screening mammogram for malignant neoplasm of breast: Secondary | ICD-10-CM

## 2014-04-11 LAB — CERVICOVAGINAL ANCILLARY ONLY
Chlamydia: NEGATIVE
Neisseria Gonorrhea: NEGATIVE
Wet Prep (BD Affirm): NEGATIVE
Wet Prep (BD Affirm): NEGATIVE
Wet Prep (BD Affirm): POSITIVE — AB

## 2014-04-12 ENCOUNTER — Telehealth: Payer: Self-pay | Admitting: *Deleted

## 2014-04-12 MED ORDER — FLUCONAZOLE 150 MG PO TABS: 150.0000 mg | ORAL_TABLET | Freq: Every day | ORAL | Status: DC

## 2014-04-12 NOTE — Telephone Encounter (Signed)
Left voice message to return call 

## 2014-04-12 NOTE — Telephone Encounter (Signed)
-----   Message from Lance Bosch, NP sent at 04/12/2014 11:36 AM EST ----- Negative STD. Still waiting for cytology reports. WIll post when available

## 2014-04-12 NOTE — Telephone Encounter (Signed)
-----   Message from Lance Bosch, NP sent at 04/11/2014 10:46 AM EST ----- Positive for yeast. Diflucan 150 mg PO Once. Please send 1 tablet with one refill. May repeat in one week if no improvement

## 2014-04-12 NOTE — Telephone Encounter (Signed)
Pt aware of lab results 

## 2014-04-17 ENCOUNTER — Ambulatory Visit: Payer: 59 | Attending: Internal Medicine

## 2014-04-17 DIAGNOSIS — E785 Hyperlipidemia, unspecified: Secondary | ICD-10-CM

## 2014-04-17 DIAGNOSIS — Z0000 Encounter for general adult medical examination without abnormal findings: Secondary | ICD-10-CM

## 2014-04-17 LAB — LIPID PANEL
Cholesterol: 227 mg/dL — ABNORMAL HIGH (ref 0–200)
HDL: 36 mg/dL — ABNORMAL LOW (ref >39)
LDL Cholesterol: 136 mg/dL — ABNORMAL HIGH (ref 0–99)
Total CHOL/HDL Ratio: 6.3 Ratio
Triglycerides: 274 mg/dL — ABNORMAL HIGH (ref <150)
VLDL: 55 mg/dL — ABNORMAL HIGH (ref 0–40)

## 2014-04-17 LAB — RPR

## 2014-04-17 LAB — HIV ANTIBODY (ROUTINE TESTING W REFLEX): HIV 1&2 Ab, 4th Generation: NONREACTIVE

## 2014-04-19 ENCOUNTER — Telehealth: Payer: Self-pay | Admitting: *Deleted

## 2014-04-19 NOTE — Telephone Encounter (Signed)
Pt aware of results, advised to maintain a good low fat diet and take medication as prescribed  Pt stated is taking medication daily but sometimes forget to take it.

## 2014-04-19 NOTE — Telephone Encounter (Signed)
-----   Message from Lance Bosch, NP sent at 04/18/2014  5:48 PM EST ----- Patient cholesterol remains really elevated. Please find out if she taking medication daily. Please give proper education

## 2014-05-17 ENCOUNTER — Inpatient Hospital Stay: Admission: RE | Admit: 2014-05-17 | Payer: 59 | Source: Ambulatory Visit

## 2014-05-21 ENCOUNTER — Other Ambulatory Visit: Payer: Self-pay | Admitting: Internal Medicine

## 2014-05-21 ENCOUNTER — Other Ambulatory Visit: Payer: Self-pay | Admitting: *Deleted

## 2014-05-21 ENCOUNTER — Telehealth: Payer: Self-pay | Admitting: *Deleted

## 2014-05-21 MED ORDER — ALBUTEROL SULFATE HFA 108 (90 BASE) MCG/ACT IN AERS: 2.0000 | INHALATION_SPRAY | Freq: Four times a day (QID) | RESPIRATORY_TRACT | Status: DC | PRN

## 2014-05-21 NOTE — Telephone Encounter (Signed)
Pt requesting Rx Albuterol  Refill send to [place]

## 2014-05-28 ENCOUNTER — Ambulatory Visit
Admission: RE | Admit: 2014-05-28 | Discharge: 2014-05-28 | Disposition: A | Discharge status: Home / Self Care | Payer: No Typology Code available for payment source | Source: Ambulatory Visit | Attending: Internal Medicine | Admitting: Internal Medicine

## 2014-05-28 DIAGNOSIS — Z1231 Encounter for screening mammogram for malignant neoplasm of breast: Secondary | ICD-10-CM

## 2014-05-29 ENCOUNTER — Telehealth: Payer: Self-pay | Admitting: *Deleted

## 2014-05-29 NOTE — Telephone Encounter (Signed)
Left message for pt to return my call.

## 2014-05-29 NOTE — Telephone Encounter (Signed)
-----   Message from Lance Bosch, NP sent at 05/29/2014 12:20 PM EST ----- Negative mammogram will repeat in one year

## 2014-05-30 ENCOUNTER — Telehealth: Payer: Self-pay | Admitting: *Deleted

## 2014-05-30 NOTE — Telephone Encounter (Signed)
-----   Message from Lance Bosch, NP sent at 05/29/2014 12:20 PM EST ----- Negative mammogram will repeat in one year

## 2014-05-30 NOTE — Telephone Encounter (Signed)
Pt is aware of her MM results. 

## 2014-06-05 ENCOUNTER — Other Ambulatory Visit (HOSPITAL_COMMUNITY): Payer: Self-pay | Admitting: *Deleted

## 2014-06-06 ENCOUNTER — Encounter (HOSPITAL_COMMUNITY)
Admission: RE | Admit: 2014-06-06 | Discharge: 2014-06-06 | Disposition: A | Discharge status: Home / Self Care | Payer: No Typology Code available for payment source | Source: Ambulatory Visit | Attending: Nephrology | Admitting: Nephrology

## 2014-06-06 DIAGNOSIS — D509 Iron deficiency anemia, unspecified: Secondary | ICD-10-CM | POA: Diagnosis not present

## 2014-06-06 MED ORDER — SODIUM CHLORIDE 0.9 % IV SOLN
510.0000 mg | INTRAVENOUS | Status: DC
  Administered 2014-06-06: 510 mg via INTRAVENOUS
  Filled 2014-06-06: qty 17

## 2014-06-12 ENCOUNTER — Other Ambulatory Visit (HOSPITAL_COMMUNITY): Payer: Self-pay | Admitting: *Deleted

## 2014-06-13 ENCOUNTER — Encounter (HOSPITAL_COMMUNITY)
Admission: RE | Admit: 2014-06-13 | Discharge: 2014-06-13 | Disposition: A | Discharge status: Home / Self Care | Payer: No Typology Code available for payment source | Source: Ambulatory Visit | Attending: Nephrology | Admitting: Nephrology

## 2014-06-13 DIAGNOSIS — D509 Iron deficiency anemia, unspecified: Secondary | ICD-10-CM | POA: Diagnosis not present

## 2014-06-13 MED ORDER — SODIUM CHLORIDE 0.9 % IV SOLN
510.0000 mg | INTRAVENOUS | Status: DC
  Administered 2014-06-13: 510 mg via INTRAVENOUS
  Filled 2014-06-13: qty 17

## 2014-07-23 ENCOUNTER — Ambulatory Visit: Payer: No Typology Code available for payment source | Attending: Internal Medicine

## 2014-07-23 ENCOUNTER — Other Ambulatory Visit: Payer: Self-pay | Admitting: Internal Medicine

## 2014-07-23 DIAGNOSIS — D631 Anemia in chronic kidney disease: Secondary | ICD-10-CM

## 2014-07-23 DIAGNOSIS — N189 Chronic kidney disease, unspecified: Secondary | ICD-10-CM

## 2014-07-23 DIAGNOSIS — N184 Chronic kidney disease, stage 4 (severe): Secondary | ICD-10-CM

## 2014-07-23 LAB — CBC
HCT: 34 % — ABNORMAL LOW (ref 36.0–46.0)
Hemoglobin: 11.3 g/dL — ABNORMAL LOW (ref 12.0–15.0)
MCH: 24.4 pg — ABNORMAL LOW (ref 26.0–34.0)
MCHC: 33.2 g/dL (ref 30.0–36.0)
MCV: 73.4 fL — ABNORMAL LOW (ref 78.0–100.0)
MPV: 9.5 fL (ref 8.6–12.4)
Platelets: 155 10*3/uL (ref 150–400)
RBC: 4.63 MIL/uL (ref 3.87–5.11)
RDW: 18.2 % — ABNORMAL HIGH (ref 11.5–15.5)
WBC: 6.8 10*3/uL (ref 4.0–10.5)

## 2014-07-23 LAB — FERRITIN: Ferritin: 1632 ng/mL — ABNORMAL HIGH (ref 10–291)

## 2014-07-24 LAB — IBC PANEL
%SAT: 24 % (ref 20–55)
TIBC: 199 ug/dL — ABNORMAL LOW (ref 250–470)
UIBC: 151 ug/dL (ref 125–400)

## 2014-07-24 LAB — RENAL FUNCTION PANEL
Albumin: 3.7 g/dL (ref 3.5–5.2)
BUN: 38 mg/dL — ABNORMAL HIGH (ref 6–23)
CO2: 23 mEq/L (ref 19–32)
Calcium: 9.1 mg/dL (ref 8.4–10.5)
Chloride: 108 mEq/L (ref 96–112)
Creat: 3.1 mg/dL — ABNORMAL HIGH (ref 0.50–1.10)
Glucose, Bld: 98 mg/dL (ref 70–99)
Phosphorus: 3.8 mg/dL (ref 2.3–4.6)
Potassium: 4 mEq/L (ref 3.5–5.3)
Sodium: 140 mEq/L (ref 135–145)

## 2014-07-24 LAB — PTH, INTACT AND CALCIUM
Calcium: 9.1 mg/dL (ref 8.4–10.5)
PTH: 274 pg/mL — ABNORMAL HIGH (ref 14–64)

## 2014-07-24 LAB — IRON: Iron: 48 ug/dL (ref 42–145)

## 2014-07-27 ENCOUNTER — Encounter: Payer: Self-pay | Admitting: *Deleted

## 2014-07-27 NOTE — Progress Notes (Signed)
Patient ID: Rachael George, female   DOB: 1955-10-30, 59 y.o.   MRN: [id number]  Per Ms. Feliciana Rossetti, NP faxed patient most recent lab result to [place], Dr. Corliss Parish

## 2014-09-25 ENCOUNTER — Other Ambulatory Visit: Payer: Self-pay | Admitting: Internal Medicine

## 2014-09-25 DIAGNOSIS — I10 Essential (primary) hypertension: Secondary | ICD-10-CM

## 2014-10-01 ENCOUNTER — Telehealth: Payer: Self-pay | Admitting: Internal Medicine

## 2014-10-01 NOTE — Telephone Encounter (Signed)
Patient came into office requesting medication refill on amLODipine (NORVASC) 10 MG tablet,furosemide (LASIX) 40 MG tablet.Patient states she has been out for 1 week, please f/u with patient.

## 2014-10-04 ENCOUNTER — Encounter: Payer: Self-pay | Admitting: Internal Medicine

## 2014-10-04 ENCOUNTER — Ambulatory Visit: Payer: No Typology Code available for payment source | Attending: Internal Medicine | Admitting: Internal Medicine

## 2014-10-04 VITALS — BP 168/85 | HR 70 | Temp 98.2°F | Resp 18 | Ht 62.0 in | Wt 189.2 lb

## 2014-10-04 DIAGNOSIS — M79602 Pain in left arm: Secondary | ICD-10-CM | POA: Insufficient documentation

## 2014-10-04 DIAGNOSIS — M109 Gout, unspecified: Secondary | ICD-10-CM | POA: Insufficient documentation

## 2014-10-04 DIAGNOSIS — I10 Essential (primary) hypertension: Secondary | ICD-10-CM | POA: Diagnosis not present

## 2014-10-04 DIAGNOSIS — Z79899 Other long term (current) drug therapy: Secondary | ICD-10-CM | POA: Insufficient documentation

## 2014-10-04 DIAGNOSIS — K219 Gastro-esophageal reflux disease without esophagitis: Secondary | ICD-10-CM | POA: Insufficient documentation

## 2014-10-04 DIAGNOSIS — M79672 Pain in left foot: Secondary | ICD-10-CM | POA: Insufficient documentation

## 2014-10-04 DIAGNOSIS — E785 Hyperlipidemia, unspecified: Secondary | ICD-10-CM | POA: Diagnosis not present

## 2014-10-04 DIAGNOSIS — K047 Periapical abscess without sinus: Secondary | ICD-10-CM | POA: Diagnosis not present

## 2014-10-04 DIAGNOSIS — N184 Chronic kidney disease, stage 4 (severe): Secondary | ICD-10-CM | POA: Diagnosis not present

## 2014-10-04 DIAGNOSIS — I129 Hypertensive chronic kidney disease with stage 1 through stage 4 chronic kidney disease, or unspecified chronic kidney disease: Secondary | ICD-10-CM | POA: Insufficient documentation

## 2014-10-04 DIAGNOSIS — Z7952 Long term (current) use of systemic steroids: Secondary | ICD-10-CM | POA: Diagnosis not present

## 2014-10-04 DIAGNOSIS — R739 Hyperglycemia, unspecified: Secondary | ICD-10-CM | POA: Insufficient documentation

## 2014-10-04 DIAGNOSIS — J45909 Unspecified asthma, uncomplicated: Secondary | ICD-10-CM | POA: Insufficient documentation

## 2014-10-04 LAB — LIPID PANEL
Cholesterol: 215 mg/dL — ABNORMAL HIGH (ref 0–200)
HDL: 38 mg/dL — ABNORMAL LOW (ref >=46)
LDL Cholesterol: 143 mg/dL — ABNORMAL HIGH (ref 0–99)
Total CHOL/HDL Ratio: 5.7 Ratio
Triglycerides: 170 mg/dL — ABNORMAL HIGH (ref <150)
VLDL: 34 mg/dL (ref 0–40)

## 2014-10-04 LAB — COMPLETE METABOLIC PANEL WITH GFR
ALT: 12 U/L (ref 0–35)
AST: 15 U/L (ref 0–37)
Albumin: 3.7 g/dL (ref 3.5–5.2)
Alkaline Phosphatase: 132 U/L — ABNORMAL HIGH (ref 39–117)
BUN: 63 mg/dL — ABNORMAL HIGH (ref 6–23)
CO2: 21 mEq/L (ref 19–32)
Calcium: 8.8 mg/dL (ref 8.4–10.5)
Chloride: 109 mEq/L (ref 96–112)
Creat: 3.71 mg/dL — ABNORMAL HIGH (ref 0.50–1.10)
GFR, Est African American: 15 mL/min — ABNORMAL LOW
GFR, Est Non African American: 13 mL/min — ABNORMAL LOW
Glucose, Bld: 97 mg/dL (ref 70–99)
Potassium: 4.1 mEq/L (ref 3.5–5.3)
Sodium: 143 mEq/L (ref 135–145)
Total Bilirubin: 0.4 mg/dL (ref 0.2–1.2)
Total Protein: 6.7 g/dL (ref 6.0–8.3)

## 2014-10-04 LAB — IRON: Iron: 46 ug/dL (ref 42–145)

## 2014-10-04 LAB — IBC PANEL
%SAT: 22 % (ref 20–55)
TIBC: 205 ug/dL — ABNORMAL LOW (ref 250–470)
UIBC: 159 ug/dL (ref 125–400)

## 2014-10-04 LAB — URIC ACID: Uric Acid, Serum: 11.7 mg/dL — ABNORMAL HIGH (ref 2.4–7.0)

## 2014-10-04 LAB — POCT GLYCOSYLATED HEMOGLOBIN (HGB A1C): Hemoglobin A1C: 5.9

## 2014-10-04 LAB — GLUCOSE, POCT (MANUAL RESULT ENTRY): POC Glucose: 102 mg/dl — AB (ref 70–99)

## 2014-10-04 LAB — FERRITIN: Ferritin: 1168 ng/mL — ABNORMAL HIGH (ref 10–291)

## 2014-10-04 MED ORDER — AMLODIPINE BESYLATE 10 MG PO TABS: 10.0000 mg | ORAL_TABLET | Freq: Every day | ORAL | Status: DC

## 2014-10-04 MED ORDER — OXYCODONE-ACETAMINOPHEN 5-325 MG PO TABS: 1.0000 | ORAL_TABLET | Freq: Two times a day (BID) | ORAL | Status: DC | PRN

## 2014-10-04 MED ORDER — CLINDAMYCIN HCL 150 MG PO CAPS: 150.0000 mg | ORAL_CAPSULE | Freq: Two times a day (BID) | ORAL | Status: DC

## 2014-10-04 MED ORDER — FUROSEMIDE 40 MG PO TABS: ORAL_TABLET | ORAL | Status: DC

## 2014-10-04 MED ORDER — OXYCODONE-ACETAMINOPHEN 5-325 MG PO TABS: 1.0000 | ORAL_TABLET | Freq: Four times a day (QID) | ORAL | Status: DC | PRN

## 2014-10-04 NOTE — Patient Instructions (Addendum)
Take your BP medication daily. I have checked you to see if you are having a gout flare. Once I get results I will call you and give you further directions for pain.   I have given you a short dose of pain medication for your foot pain.  I have sent Clindamycin for infection of mouth. Only take for 5 days

## 2014-10-04 NOTE — Progress Notes (Signed)
Patient ID: Rachael George, female   DOB: 26-Jul-1955, 59 y.o.   MRN: [id number]  CC: HTN f/u   HPI: Rachael George is a 59 y.o. female here today for a follow up visit.  Patient has past medical history of HTN, CKD, HLD. She reports that she has not been feeling so great since she has been out of her Lasix and Norvasc for over one week. She has only been taking Coreg since then. She is requesting blood work today since she is due to see her Nephrologist next week. She c/o left foot pain/swelling for 2 days that is constant and rated as a 8/10 on pain scale. The pain has made it difficult for patient to walk. She does have a history of gout and this feels similar to a flare. The pain is located on the left lateral aspect of her foot. She also notes some intermittent left arm pain described as a ache.   Allergies  Allergen Reactions  . Atacand [Candesartan] Anaphylaxis  . Lisinopril Anaphylaxis  . Motrin [Ibuprofen] Anaphylaxis  . Nsaids Anaphylaxis  . Penicillins Rash   Past Medical History  Diagnosis Date  . Asthma   . GERD (gastroesophageal reflux disease)   . Hypertension   . High cholesterol   . Kidney disease   . Kidney stones   . History of blood transfusion   . Heavy menstrual bleeding   . Prediabetes   . Hyperlipemia 12/07/2012   Current Outpatient Prescriptions on File Prior to Visit  Medication Sig Dispense Refill  . albuterol (PROAIR HFA) 108 (90 BASE) MCG/ACT inhaler Inhale 2 puffs into the lungs every 6 (six) hours as needed for wheezing. 1 Inhaler 4  . atorvastatin (LIPITOR) 40 MG tablet Take 1 tablet (40 mg total) by mouth daily. 90 tablet 1  . carvedilol (COREG) 25 MG tablet Take 1 tablet (25 mg total) by mouth 2 (two) times daily with a meal. 60 tablet 3  . clotrimazole (LOTRIMIN) 1 % cream Apply 1 application topically 2 (two) times daily. 30 g 0  . amLODipine (NORVASC) 10 MG tablet TAKE 1 TABLET BY MOUTH DAILY. (Patient not taking: Reported on 10/04/2014) 30 tablet 0   . cloNIDine (CATAPRES - DOSED IN MG/24 HR) 0.2 mg/24hr patch Place 1 patch (0.2 mg total) onto the skin once a week. (Patient not taking: Reported on 04/10/2014) 4 patch 5  . fluconazole (DIFLUCAN) 150 MG tablet Take 1 tablet (150 mg total) by mouth daily. (Patient not taking: Reported on 10/04/2014) 1 tablet 0  . furosemide (LASIX) 40 MG tablet TAKE 1 TABLET BY MOUTH 2 TIMES DAILY. (Patient not taking: Reported on 10/04/2014) 60 tablet 0  . gabapentin (NEURONTIN) 100 MG capsule Take 1 capsule (100 mg total) by mouth daily. (Patient not taking: Reported on 04/10/2014) 30 capsule 1  . oxyCODONE-acetaminophen (PERCOCET/ROXICET) 5-325 MG per tablet Take 1 tablet by mouth every 6 (six) hours as needed for severe pain. (Patient not taking: Reported on 04/10/2014) 12 tablet 0  . predniSONE (DELTASONE) 20 MG tablet 3 Tabs PO Days 1-3, then 2 tabs PO Days 4-6, then 1 tab PO Day 7-9, then Half Tab PO Day 10-12 (Patient not taking: Reported on 10/04/2014) 20 tablet 0   No current facility-administered medications on file prior to visit.   Family History  Problem Relation Age of Onset  . Hypertension Mother   . Breast cancer Maternal Aunt    History   Social History  . Marital Status: Married  Spouse Name: N/A  . Number of Children: N/A  . Years of Education: N/A   Occupational History  . Not on file.   Social History Main Topics  . Smoking status: Never Smoker   . Smokeless tobacco: Not on file  . Alcohol Use: Yes     Comment: occ - 1 drink rarely  . Drug Use: No  . Sexual Activity: Not on file   Other Topics Concern  . 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 Negative for chest pain.  Musculoskeletal:  Positive for joint pain.  All other systems reviewed and are negative.   Objective:   Filed Vitals:   10/04/14 0946  BP: 168/85  Pulse: 70  Temp: 98.2 F (36.8 C)  Resp: 18    Physical Exam  Constitutional: She is oriented to person, place, and time.  Cardiovascular: Normal rate, regular rhythm and normal heart sounds.   Pulmonary/Chest: Effort normal and breath sounds normal.  Musculoskeletal: She exhibits tenderness (left later foot). She exhibits no edema.  Neurological: She is alert and oriented to person, place, and time.  Skin: Skin is warm and dry.    Lab Results  Component Value Date   WBC 6.8 07/23/2014   HGB 11.3* 07/23/2014   HCT 34.0* 07/23/2014   MCV 73.4* 07/23/2014   PLT 155 07/23/2014   Lab Results  Component Value Date   CREATININE 3.10* 07/23/2014   BUN 38* 07/23/2014   NA 140 07/23/2014   K 4.0 07/23/2014   CL 108 07/23/2014   CO2 23 07/23/2014    Lab Results  Component Value Date   HGBA1C 6.3 12/25/2013   Lipid Panel     Component Value Date/Time   CHOL 227* 04/17/2014 0906   TRIG 274* 04/17/2014 0906   HDL 36* 04/17/2014 0906   CHOLHDL 6.3 04/17/2014 0906   VLDL 55* 04/17/2014 0906   LDLCALC 136* 04/17/2014 0906       Assessment and plan:   Rachael George was seen today for medication refill and foot pain.  Diagnoses and all orders for this visit:  Essential hypertension Orders: -     Refill amLODipine (NORVASC) 10 MG tablet; Take 1 tablet (10 mg total) by mouth daily. -     Refill furosemide (LASIX) 40 MG tablet; TAKE 1 TABLET BY MOUTH 2 TIMES DAILY.  Hyperglycemia Orders: -     POCT glycosylated hemoglobin (Hb A1C)--5.9% -     POCT glucose (manual entry)  CKD (chronic kidney disease), stage 4 (severe) Orders: -     Ferritin -     Iron -     CBC -     PTH, Intact and Calcium -     IBC panel -     COMPLETE METABOLIC PANEL WITH GFR Labs collected per Nephrology   HLD (hyperlipidemia) Orders: -     Lipid panel  Left  foot pain Orders: -     Uric Acid--checking for gout flare--Will give prednisone if gout flare.  -     oxyCODONE-acetaminophen (PERCOCET/ROXICET) 5-325 MG per tablet; Take 1 tablet by mouth 2 (two) times  daily as needed for severe pain. Will give due to CKD. Do not feel comfortable giving tramadol  Dental infection Orders: -     clindamycin (CLEOCIN) 150 MG capsule; Take 1 capsule (150 mg total) by mouth 2 (two) times daily. Will need dental exam asap  Return in about 3 months (around 01/04/2015) for Hypertension.       Lance Bosch, [facility] [phone] 10/04/2014, 9:56 AM

## 2014-10-04 NOTE — Progress Notes (Signed)
Patient here for medication refills.  Patient states she "feels sick" because her blood pressure has been "sky-rocketing."  Patient has been out of Amlodipine and Lasix since last Thursday, but has taken her Carvedilol.  BP 168/85.  Patient complains of left foot swelling/pain for the past 2 days that makes it difficult for her to walk.  Patient rates foot pain 8/10 and constant.  Patient also states she has left arm pain that comes and goes.   Patient wants to know if [facility] has sent over blood work information because she has an appointment with them this coming Tuesday.

## 2014-10-05 ENCOUNTER — Telehealth: Payer: Self-pay

## 2014-10-05 ENCOUNTER — Other Ambulatory Visit: Payer: Self-pay | Admitting: Internal Medicine

## 2014-10-05 DIAGNOSIS — M10372 Gout due to renal impairment, left ankle and foot: Secondary | ICD-10-CM

## 2014-10-05 DIAGNOSIS — M1A9XX0 Chronic gout, unspecified, without tophus (tophi): Secondary | ICD-10-CM | POA: Insufficient documentation

## 2014-10-05 LAB — CBC
HCT: 33.7 % — ABNORMAL LOW (ref 36.0–46.0)
Hemoglobin: 10.9 g/dL — ABNORMAL LOW (ref 12.0–15.0)
MCH: 23.9 pg — ABNORMAL LOW (ref 26.0–34.0)
MCHC: 32.3 g/dL (ref 30.0–36.0)
MCV: 73.9 fL — ABNORMAL LOW (ref 78.0–100.0)
MPV: 9.4 fL (ref 8.6–12.4)
Platelets: 139 10*3/uL — ABNORMAL LOW (ref 150–400)
RBC: 4.56 MIL/uL (ref 3.87–5.11)
RDW: 16.9 % — ABNORMAL HIGH (ref 11.5–15.5)
WBC: 7.3 10*3/uL (ref 4.0–10.5)

## 2014-10-05 LAB — PTH, INTACT AND CALCIUM
Calcium: 8.8 mg/dL (ref 8.4–10.5)
PTH: 377 pg/mL — ABNORMAL HIGH (ref 14–64)

## 2014-10-05 MED ORDER — PREDNISONE 10 MG PO TABS: 10.0000 mg | ORAL_TABLET | Freq: Every day | ORAL | Status: DC

## 2014-10-05 NOTE — Telephone Encounter (Signed)
Nurse called patients home number, reached voicemail. Left message for patient to call Almeta Geisel with [facility], at [phone]. Called patients mobile number, has been changed or disconnected.

## 2014-10-05 NOTE — Telephone Encounter (Signed)
-----   Message from Lance Bosch, NP sent at 10/05/2014  9:26 AM EDT ----- Find out if patient has been taking atorvastatin every day as directed? Her LDL is actually higher than before. Please go over diet specifics for patient relating to cholesterol. Go over exercises at least 4 times weekly for 30 minutes. If she reports that she is taking medication please increase her to atorvastatin 80 mg daily.  She is having a gout flare. I will send her a prescription for Prednisone taper. She will take 6 pills today and decrease by 1 tablet per day.  Please print copy of labs and fax to [facility] with Attention to whoever her doctor is. Thanks

## 2014-10-05 NOTE — Telephone Encounter (Signed)
Pt returning nurses call

## 2014-10-05 NOTE — Telephone Encounter (Signed)
Nurse called patient, patient verified date of birth. Patient reports taking atorvastatin as directed when she has 40mg  tabs. When she runs out of medication she takes atorvastatin 20mg  in the morning and 20mg  at night, but frequently misses the evening dose.  Patient sometimes eats low fat, low cholesterol diet. Patient requesting nurse to send cholesterol education printed documents in the mail. Patient aware of gout flare and agrees to pick up prednisone taper at pharmacy. Patient will take 6 pills today and decrease by 1 tablet per day.  Nurse printed labs and faxed to [facility]

## 2014-10-05 NOTE — Telephone Encounter (Signed)
See previous note

## 2014-10-05 NOTE — Telephone Encounter (Signed)
Pt is returning [place] call about her lab results. Please follow up with pt.

## 2014-10-12 ENCOUNTER — Other Ambulatory Visit: Payer: Self-pay | Admitting: Internal Medicine

## 2014-10-12 ENCOUNTER — Encounter: Payer: Self-pay | Admitting: Internal Medicine

## 2014-10-16 ENCOUNTER — Other Ambulatory Visit: Payer: Self-pay | Admitting: *Deleted

## 2014-10-16 DIAGNOSIS — N184 Chronic kidney disease, stage 4 (severe): Secondary | ICD-10-CM

## 2014-10-16 DIAGNOSIS — Z01810 Encounter for preprocedural cardiovascular examination: Secondary | ICD-10-CM

## 2014-11-09 NOTE — Telephone Encounter (Signed)
Refills completed

## 2014-11-16 ENCOUNTER — Encounter: Payer: Self-pay | Admitting: Surgery

## 2014-11-19 ENCOUNTER — Ambulatory Visit (INDEPENDENT_AMBULATORY_CARE_PROVIDER_SITE_OTHER)
Admission: RE | Admit: 2014-11-19 | Discharge: 2014-11-19 | Disposition: A | Discharge status: Home / Self Care | Payer: No Typology Code available for payment source | Source: Ambulatory Visit | Attending: Surgery | Admitting: Surgery

## 2014-11-19 ENCOUNTER — Ambulatory Visit (INDEPENDENT_AMBULATORY_CARE_PROVIDER_SITE_OTHER): Payer: No Typology Code available for payment source | Admitting: Surgery

## 2014-11-19 ENCOUNTER — Other Ambulatory Visit: Payer: Self-pay

## 2014-11-19 ENCOUNTER — Ambulatory Visit (HOSPITAL_COMMUNITY)
Admission: RE | Admit: 2014-11-19 | Discharge: 2014-11-19 | Disposition: A | Discharge status: Home / Self Care | Payer: No Typology Code available for payment source | Source: Ambulatory Visit | Attending: Surgery | Admitting: Surgery

## 2014-11-19 ENCOUNTER — Encounter: Payer: Self-pay | Admitting: Surgery

## 2014-11-19 VITALS — BP 160/84 | HR 76 | Temp 97.9°F | Resp 16 | Ht 62.0 in | Wt 189.5 lb

## 2014-11-19 DIAGNOSIS — N184 Chronic kidney disease, stage 4 (severe): Secondary | ICD-10-CM | POA: Diagnosis not present

## 2014-11-19 DIAGNOSIS — Z01810 Encounter for preprocedural cardiovascular examination: Secondary | ICD-10-CM | POA: Diagnosis not present

## 2014-11-19 NOTE — Progress Notes (Signed)
Patient name: Rachael George MRN: [id number] DOB: Nov 21, 1955 Sex: female   Referred by: Dr. Moshe Cipro  Reason for referral:  Chief Complaint  Patient presents with  . Re-evaluation    CKD stg 4. Evaluation for AVF Referred by Dr. Moshe Cipro    HISTORY OF PRESENT ILLNESS: This is a very delightful 59 year old female who is referred today for evaluation of dialysis access.  She has not yet started renal replacement therapy.  She is left-handed.  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  Complications from renal failure are secondary hyperparathyroidism and anemia.  She suffers from hyperlipidemia which is managed with a statin.  She is on multiple medications for hypertension.  She is a nonsmoker.  Past Medical History  Diagnosis Date  . Asthma   . GERD (gastroesophageal reflux disease)   . Hypertension   . High cholesterol   . Kidney disease   . Kidney stones   . History of blood transfusion   . Heavy menstrual bleeding   . Prediabetes   . Hyperlipemia 12/07/2012    Past Surgical History  Procedure Laterality Date  . Abdominal hysterectomy  2000  . Left shoulder surgery  08/2005  . Tubal ligation  11/1986  . Ectopic pregnancy surgery  02/1982    Social History   Social History  . Marital Status: Married    Spouse Name: N/A  . Number of Children: 3  . Years of Education: N/A   Occupational History  . Not on file.   Social History Main Topics  . Smoking status: Never Smoker   . Smokeless tobacco: Not on file  . Alcohol Use: 0.0 oz/week    0 Standard drinks or equivalent per week     Comment: occ - 1 drink rarely  . Drug Use: No  . Sexual Activity: Not on file   Other Topics Concern  . 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 Hypertension Mother   . Breast cancer Maternal Aunt   . Deep vein thrombosis Daughter   . Hyperlipidemia Daughter   . Hypertension Daughter   . Prostate cancer Maternal Grandmother     Allergies as of 11/19/2014 - Review Complete 11/19/2014  Allergen Reaction Noted  . Atacand [candesartan] Anaphylaxis 12/07/2012  . Lisinopril Anaphylaxis 12/07/2012  . Motrin [ibuprofen] Anaphylaxis 12/07/2012  . Nsaids Anaphylaxis 12/07/2012  . Penicillins Rash 12/07/2012    Current Outpatient Prescriptions on File Prior to Visit  Medication Sig Dispense Refill  . albuterol (PROAIR HFA) 108 (90 BASE) MCG/ACT inhaler Inhale 2 puffs into the lungs every 6 (six) hours as needed for wheezing. 1 Inhaler 4  . amLODipine (NORVASC) 10 MG tablet Take 1 tablet (10 mg total) by mouth daily. 30 tablet 3  . atorvastatin (LIPITOR) 40 MG tablet Take 1 tablet (40 mg total) by mouth daily. 90 tablet 1  . carvedilol (COREG) 25 MG tablet Take 1 tablet (25 mg total) by mouth 2 (two) times daily with a meal. 60 tablet 3  . furosemide (LASIX) 40 MG tablet TAKE 1 TABLET BY MOUTH 2 TIMES DAILY. (Patient  taking differently: Take 80 mg by mouth 2 (two) times daily. TAKE 1 TABLET BY MOUTH 2 TIMES DAILY.) 60 tablet 3  . minoxidil (LONITEN) 2.5 MG tablet Take by mouth 2 (two) times daily.    Marland Kitchen oxyCODONE-acetaminophen (PERCOCET/ROXICET) 5-325 MG per tablet Take 1 tablet by mouth 2 (two) times daily as needed for severe pain. (Patient not taking: Reported on 11/19/2014) 12 tablet 0   No current facility-administered medications on file prior to visit.     REVIEW OF SYSTEMS: Cardiovascular: No chest pain, chest pressure, palpitations, orthopnea, or dyspnea on exertion. No claudication or rest pain,  No history of DVT or phlebitis.  Positive for pain in legs on walking Pulmonary: No productive cough or wheezing.  Positive for asthma Neurologic: No weakness,  paresthesias, aphasia, or amaurosis. No dizziness. Hematologic: No bleeding problems or clotting disorders. Musculoskeletal: No joint pain or joint swelling. Gastrointestinal: No blood in stool or hematemesis Genitourinary: No dysuria or hematuria. Psychiatric:: No history of major depression. Integumentary: No rashes or ulcers. Constitutional: No fever or chills.  PHYSICAL EXAMINATION:  Filed Vitals:   11/19/14 1022  BP: 160/84  Pulse: 76  Temp: 97.9 F (36.6 C)  TempSrc: Oral  Resp: 16  Height: 5\' 2"  (1.575 m)  Weight: 189 lb 8 oz (85.957 kg)  SpO2: 100%   Body mass index is 34.65 kg/(m^2). General: The patient appears their stated age.   HEENT:  No gross abnormalities Pulmonary: Respirations are non-labored Abdomen: Soft and non-tender  Musculoskeletal: There are no major deformities.   Neurologic: No focal weakness or paresthesias are detected, Skin: There are no ulcer or rashes noted. Psychiatric: The patient has normal affect. Cardiovascular: There is a regular rate and rhythm without significant murmur appreciated.  Palpable right radial pulse  Diagnostic Studies: I have reviewed her vein mapping which shows small cephalic veins bilaterally.  She does have an adequate right basilic vein.  There is a high bifurcation on the left.   Assessment:  Chronic renal insufficiency, stage IV Plan: Patient has had significant drop in the kidney function.  Access planning has been recommended.  After reviewing her vein mapping, I feel the best operation is going to be a first stage basilic vein transposition on the right.  I discussed the details of the procedure including the need for a second operation at the vein matures.  We also discussed the possibility of fistula failure as well as steal syndrome.  The patient is been scheduled for a right first stage basilic vein transposition on Thursday, September 15.     Eldridge Abrahams, M.D. [facility] [place] Office: [phone] Pager:  [phone]

## 2014-12-05 ENCOUNTER — Encounter (HOSPITAL_COMMUNITY): Payer: Self-pay | Admitting: *Deleted

## 2014-12-05 NOTE — Progress Notes (Signed)
Pt denies SOB, chest pain, and being under the care of a cardiologist. Pt stated that she had an echo 5 years ago in [place], [place] and a stress test done more than 10 years ago. Pt made aware to stop otc vitamins and herbal medications. Pt verbalized understanding of all pre-op instructions.

## 2014-12-06 ENCOUNTER — Ambulatory Visit (HOSPITAL_COMMUNITY)
Admission: RE | Admit: 2014-12-06 | Discharge: 2014-12-06 | Disposition: A | Discharge status: Home / Self Care | Payer: No Typology Code available for payment source | Source: Ambulatory Visit | Attending: Surgery | Admitting: Surgery

## 2014-12-06 ENCOUNTER — Ambulatory Visit (HOSPITAL_COMMUNITY): Payer: No Typology Code available for payment source | Admitting: Certified Registered Nurse Anesthetist

## 2014-12-06 ENCOUNTER — Encounter (HOSPITAL_COMMUNITY): Payer: Self-pay | Admitting: *Deleted

## 2014-12-06 ENCOUNTER — Encounter (HOSPITAL_COMMUNITY)
Admission: RE | Disposition: A | Discharge status: Home / Self Care | Payer: Self-pay | Source: Ambulatory Visit | Attending: Surgery

## 2014-12-06 DIAGNOSIS — N184 Chronic kidney disease, stage 4 (severe): Secondary | ICD-10-CM | POA: Diagnosis not present

## 2014-12-06 DIAGNOSIS — M79672 Pain in left foot: Secondary | ICD-10-CM

## 2014-12-06 DIAGNOSIS — N185 Chronic kidney disease, stage 5: Secondary | ICD-10-CM | POA: Diagnosis not present

## 2014-12-06 DIAGNOSIS — I129 Hypertensive chronic kidney disease with stage 1 through stage 4 chronic kidney disease, or unspecified chronic kidney disease: Secondary | ICD-10-CM | POA: Diagnosis present

## 2014-12-06 DIAGNOSIS — Z880 Allergy status to penicillin: Secondary | ICD-10-CM | POA: Diagnosis not present

## 2014-12-06 DIAGNOSIS — E785 Hyperlipidemia, unspecified: Secondary | ICD-10-CM | POA: Diagnosis not present

## 2014-12-06 HISTORY — PX: BASCILIC VEIN TRANSPOSITION: SHX5742

## 2014-12-06 HISTORY — DX: Gout, unspecified: M10.9

## 2014-12-06 HISTORY — DX: Anemia, unspecified: D64.9

## 2014-12-06 HISTORY — DX: Rheumatoid arthritis, unspecified: M06.9

## 2014-12-06 LAB — POCT I-STAT 4, (NA,K, GLUC, HGB,HCT)
Glucose, Bld: 104 mg/dL — ABNORMAL HIGH (ref 65–99)
HCT: 31 % — ABNORMAL LOW (ref 36.0–46.0)
Hemoglobin: 10.5 g/dL — ABNORMAL LOW (ref 12.0–15.0)
Potassium: 3.6 mmol/L (ref 3.5–5.1)
Sodium: 141 mmol/L (ref 135–145)

## 2014-12-06 SURGERY — TRANSPOSITION, VEIN, BASILIC
Anesthesia: Monitor Anesthesia Care | Site: Arm Upper | Laterality: Right

## 2014-12-06 MED ORDER — HEPARIN SODIUM (PORCINE) 1000 UNIT/ML IJ SOLN
INTRAMUSCULAR | Status: DC | PRN
  Administered 2014-12-06: 3000 [IU] via INTRAVENOUS

## 2014-12-06 MED ORDER — ONDANSETRON HCL 4 MG/2ML IJ SOLN
INTRAMUSCULAR | Status: AC
  Filled 2014-12-06: qty 2

## 2014-12-06 MED ORDER — ROPIVACAINE HCL 5 MG/ML IJ SOLN
INTRAMUSCULAR | Status: DC | PRN
  Administered 2014-12-06: 30 mL via PERINEURAL

## 2014-12-06 MED ORDER — PROTAMINE SULFATE 10 MG/ML IV SOLN
INTRAVENOUS | Status: AC
  Filled 2014-12-06: qty 5

## 2014-12-06 MED ORDER — 0.9 % SODIUM CHLORIDE (POUR BTL) OPTIME
TOPICAL | Status: DC | PRN
  Administered 2014-12-06: 1000 mL

## 2014-12-06 MED ORDER — FENTANYL CITRATE (PF) 100 MCG/2ML IJ SOLN
50.0000 ug | Freq: Once | INTRAMUSCULAR | Status: AC
  Administered 2014-12-06: 50 ug via INTRAVENOUS

## 2014-12-06 MED ORDER — FENTANYL CITRATE (PF) 250 MCG/5ML IJ SOLN
INTRAMUSCULAR | Status: AC
  Filled 2014-12-06: qty 5

## 2014-12-06 MED ORDER — FENTANYL CITRATE (PF) 100 MCG/2ML IJ SOLN
INTRAMUSCULAR | Status: DC | PRN
  Administered 2014-12-06: 50 ug via INTRAVENOUS

## 2014-12-06 MED ORDER — HEMOSTATIC AGENTS (NO CHARGE) OPTIME
TOPICAL | Status: DC | PRN
  Administered 2014-12-06: 1

## 2014-12-06 MED ORDER — FENTANYL CITRATE (PF) 100 MCG/2ML IJ SOLN: 25.0000 ug | INTRAMUSCULAR | Status: DC | PRN

## 2014-12-06 MED ORDER — ONDANSETRON HCL 4 MG/2ML IJ SOLN: 4.0000 mg | Freq: Once | INTRAMUSCULAR | Status: DC | PRN

## 2014-12-06 MED ORDER — LIDOCAINE HCL (CARDIAC) 20 MG/ML IV SOLN
INTRAVENOUS | Status: AC
  Filled 2014-12-06: qty 5

## 2014-12-06 MED ORDER — FENTANYL CITRATE (PF) 100 MCG/2ML IJ SOLN
INTRAMUSCULAR | Status: AC
  Filled 2014-12-06: qty 2

## 2014-12-06 MED ORDER — PHENYLEPHRINE HCL 10 MG/ML IJ SOLN
INTRAMUSCULAR | Status: DC | PRN
  Administered 2014-12-06 (×5): 80 ug via INTRAVENOUS

## 2014-12-06 MED ORDER — PHENYLEPHRINE 40 MCG/ML (10ML) SYRINGE FOR IV PUSH (FOR BLOOD PRESSURE SUPPORT)
PREFILLED_SYRINGE | INTRAVENOUS | Status: AC
  Filled 2014-12-06: qty 10

## 2014-12-06 MED ORDER — OXYCODONE-ACETAMINOPHEN 5-325 MG PO TABS
ORAL_TABLET | ORAL | Status: AC
  Administered 2014-12-06: 2 via ORAL
  Filled 2014-12-06: qty 2

## 2014-12-06 MED ORDER — VANCOMYCIN HCL IN DEXTROSE 1-5 GM/200ML-% IV SOLN
1000.0000 mg | INTRAVENOUS | Status: DC
  Filled 2014-12-06: qty 200

## 2014-12-06 MED ORDER — OXYCODONE-ACETAMINOPHEN 5-325 MG PO TABS: 1.0000 | ORAL_TABLET | Freq: Four times a day (QID) | ORAL | Status: DC | PRN

## 2014-12-06 MED ORDER — HEPARIN SODIUM (PORCINE) 1000 UNIT/ML IJ SOLN
INTRAMUSCULAR | Status: AC
  Filled 2014-12-06: qty 1

## 2014-12-06 MED ORDER — MIDAZOLAM HCL 5 MG/ML IJ SOLN: 1.0000 mg | Freq: Once | INTRAMUSCULAR | Status: DC

## 2014-12-06 MED ORDER — SODIUM CHLORIDE 0.9 % IV SOLN
INTRAVENOUS | Status: DC | PRN
  Administered 2014-12-06: 500 mL

## 2014-12-06 MED ORDER — MIDAZOLAM HCL 5 MG/5ML IJ SOLN
INTRAMUSCULAR | Status: DC | PRN
  Administered 2014-12-06 (×2): 2 mg via INTRAVENOUS

## 2014-12-06 MED ORDER — SODIUM CHLORIDE 0.9 % IV SOLN
INTRAVENOUS | Status: DC
  Administered 2014-12-06: 11:00:00 via INTRAVENOUS

## 2014-12-06 MED ORDER — PROPOFOL INFUSION 10 MG/ML OPTIME
INTRAVENOUS | Status: DC | PRN
  Administered 2014-12-06: 50 ug/kg/min via INTRAVENOUS

## 2014-12-06 MED ORDER — MIDAZOLAM HCL 2 MG/2ML IJ SOLN
INTRAMUSCULAR | Status: AC
  Administered 2014-12-06: 1 mg
  Filled 2014-12-06: qty 2

## 2014-12-06 MED ORDER — PROTAMINE SULFATE 10 MG/ML IV SOLN
INTRAVENOUS | Status: DC | PRN
  Administered 2014-12-06: 10 mg via INTRAVENOUS
  Administered 2014-12-06: 15 mg via INTRAVENOUS

## 2014-12-06 MED ORDER — LIDOCAINE HCL (CARDIAC) 20 MG/ML IV SOLN
INTRAVENOUS | Status: DC | PRN
  Administered 2014-12-06: 40 mg via INTRAVENOUS

## 2014-12-06 MED ORDER — MIDAZOLAM HCL 2 MG/2ML IJ SOLN
INTRAMUSCULAR | Status: AC
  Filled 2014-12-06: qty 2

## 2014-12-06 MED ORDER — CHLORHEXIDINE GLUCONATE CLOTH 2 % EX PADS: 6.0000 | MEDICATED_PAD | Freq: Once | CUTANEOUS | Status: DC

## 2014-12-06 MED ORDER — VANCOMYCIN HCL IN DEXTROSE 1-5 GM/200ML-% IV SOLN
INTRAVENOUS | Status: AC
  Administered 2014-12-06: 1000 mg via INTRAVENOUS
  Filled 2014-12-06: qty 200

## 2014-12-06 MED ORDER — OXYCODONE-ACETAMINOPHEN 5-325 MG PO TABS
1.0000 | ORAL_TABLET | Freq: Once | ORAL | Status: AC
  Administered 2014-12-06: 2 via ORAL

## 2014-12-06 SURGICAL SUPPLY — 32 items
CANISTER SUCTION 2500CC (MISCELLANEOUS) ×3 IMPLANT
CLIP TI MEDIUM 6 (CLIP) IMPLANT
CLIP TI WIDE RED SMALL 6 (CLIP) IMPLANT
COVER PROBE W GEL 5X96 (DRAPES) ×3 IMPLANT
ELECT REM PT RETURN 9FT ADLT (ELECTROSURGICAL) ×3
ELECTRODE REM PT RTRN 9FT ADLT (ELECTROSURGICAL) ×1 IMPLANT
GLOVE BIOGEL PI IND STRL 7.0 (GLOVE) ×3 IMPLANT
GLOVE BIOGEL PI IND STRL 7.5 (GLOVE) ×1 IMPLANT
GLOVE BIOGEL PI INDICATOR 7.0 (GLOVE) ×6
GLOVE BIOGEL PI INDICATOR 7.5 (GLOVE) ×2
GLOVE ECLIPSE 7.0 STRL STRAW (GLOVE) ×3 IMPLANT
GLOVE SURG SS PI 7.5 STRL IVOR (GLOVE) ×3 IMPLANT
GOWN STRL REUS W/ TWL LRG LVL3 (GOWN DISPOSABLE) ×2 IMPLANT
GOWN STRL REUS W/ TWL XL LVL3 (GOWN DISPOSABLE) ×1 IMPLANT
GOWN STRL REUS W/TWL LRG LVL3 (GOWN DISPOSABLE) ×4
GOWN STRL REUS W/TWL XL LVL3 (GOWN DISPOSABLE) ×2
HEMOSTAT SNOW SURGICEL 2X4 (HEMOSTASIS) ×3 IMPLANT
KIT BASIN OR (CUSTOM PROCEDURE TRAY) ×3 IMPLANT
KIT ROOM TURNOVER OR (KITS) ×3 IMPLANT
LIQUID BAND (GAUZE/BANDAGES/DRESSINGS) ×3 IMPLANT
MARKER SKIN DUAL TIP RULER LAB (MISCELLANEOUS) ×3 IMPLANT
NS IRRIG 1000ML POUR BTL (IV SOLUTION) ×3 IMPLANT
PACK CV ACCESS (CUSTOM PROCEDURE TRAY) ×3 IMPLANT
PAD ARMBOARD 7.5X6 YLW CONV (MISCELLANEOUS) ×6 IMPLANT
SUT PROLENE 6 0 C 1 24 (SUTURE) ×3 IMPLANT
SUT PROLENE 6 0 CC (SUTURE) ×3 IMPLANT
SUT SILK 2 0 SH (SUTURE) ×3 IMPLANT
SUT VIC AB 3-0 SH 27 (SUTURE) ×2
SUT VIC AB 3-0 SH 27X BRD (SUTURE) ×1 IMPLANT
SUT VICRYL 4-0 PS2 18IN ABS (SUTURE) IMPLANT
UNDERPAD 30X30 INCONTINENT (UNDERPADS AND DIAPERS) ×3 IMPLANT
WATER STERILE IRR 1000ML POUR (IV SOLUTION) ×3 IMPLANT

## 2014-12-06 NOTE — Anesthesia Procedure Notes (Addendum)
Anesthesia Regional Block:  Supraclavicular block  Pre-Anesthetic Checklist: ,, timeout performed, Correct Patient, Correct Site, Correct Laterality, Correct Procedure, Correct Position, site marked, Risks and benefits discussed,  Surgical consent,  Pre-op evaluation,  At surgeon's request and post-op pain management  Laterality: Right  Prep: chloraprep       Needles:  Injection technique: Single-shot  Needle Type: Echogenic Stimulator Needle     Needle Length: 5cm 5 cm Needle Gauge: 22 and 22 G    Additional Needles:  Procedures: ultrasound guided (picture in chart) Supraclavicular block Narrative:  Start time: 12/06/2014 12:41 PM End time: 12/06/2014 12:44 PM Injection made incrementally with aspirations every 5 mL.  Performed by: Personally   Additional Notes: Functioning IV was confirmed and monitors were applied.  A 139mm 22ga Arrow echogenic stimulator needle was used. Sterile prep and drape,hand hygiene and sterile gloves were used.  Negative aspiration and negative test dose prior to incremental administration of local anesthetic. The patient tolerated the procedure well.  Ultrasound guidance: relevent anatomy identified, needle position confirmed, local anesthetic spread visualized around nerve(s), vascular puncture avoided.  Image printed for medical record.    Procedure Name: MAC Date/Time: 12/06/2014 3:06 PM Performed by: Barrington Ellison Pre-anesthesia Checklist: Patient identified, Emergency Drugs available, Suction available, Patient being monitored and Timeout performed Patient Re-evaluated:Patient Re-evaluated prior to inductionOxygen Delivery Method: Simple face mask Preoxygenation: Pre-oxygenation with 100% oxygen

## 2014-12-06 NOTE — Op Note (Signed)
    Patient name: LIVA SCHAAL MRN: [id number] DOB: 04/25/1955 Sex: female  12/06/2014 Pre-operative Diagnosis: Chronic renal insufficiency Post-operative diagnosis:  Same Surgeon:  Annamarie Major Assistants:  Gerri Lins Procedure:   First stage right basilic vein transposition Anesthesia:  Shoulder block with Mac Blood Loss:  See anesthesia record Specimens:  None  Findings:  Healthy 3 mm artery.  The vein was approximately 2. 5-3 millimeters with multiple branches.  The main basilic vein was sewn end-to-side to the brachial artery.  The nerve was lateral to the vein  Indications:  The patient is here for dialysis access.  She is not yet on dialysis.  Her best option was a basilic vein transposition on the right  Procedure:  The patient was identified in the holding area and taken to [facility] [address]  The patient was then placed supine on the table. MAC anesthesia was administered.  The patient was prepped and draped in the usual sterile fashion.  A time out was called and antibiotics were administered.  Ultrasound was used to evaluate the cephalic vein in the upper arm.  It was not adequate for dialysis.  The basilic vein appeared adequate.  I made a oblique incision beginning at the antecubital crease.  Through this incision I dissected out the basilic vein.  There were numerous branches which were all ligated.  The vein was approximately 2.5-3 millimeters.  It was dissected out throughout the length of the incision marked with an ink pen for orientation.  Next the brachial artery was dissected free.  This was a 3 mm disease-free artery.  3000 units of heparin was administered.  The vein was ligated distally.  It distended nicely with heparinized saline.  The artery was then occluded.  A #11 blade was used to make an arteriotomy which was extended longitudinally with Potts scissors.  The vein was then spatulated and a running end-to-side anastomosis was created with 6-0 Prolene.  Prior to  completion, the appropriate flushing maneuvers were performed and the anastomosis was completed.  After completion, there was an excellent Doppler signal within the basilic vein and there were biphasic radial and ulnar artery signals.  Hemostasis was achieved.  The wound was closed with 2 layers of 30 Vicryls followed by Dermabond.   Disposition:  To PACU in stable condition.   Theotis Burrow, M.D. [facility] Office: [phone] Pager:  [phone]

## 2014-12-06 NOTE — H&P (View-Only) (Signed)
Patient name: Rachael George MRN: [id number] DOB: 1955/09/07 Sex: female   Referred by: Dr. Moshe Cipro  Reason for referral:  Chief Complaint  Patient presents with  . Re-evaluation    CKD stg 4. Evaluation for AVF Referred by Dr. Moshe Cipro    HISTORY OF PRESENT ILLNESS: This is a very delightful 59 year old female who is referred today for evaluation of dialysis access.  She has not yet started renal replacement therapy.  She is left-handed.  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  Complications from renal failure are secondary hyperparathyroidism and anemia.  She suffers from hyperlipidemia which is managed with a statin.  She is on multiple medications for hypertension.  She is a nonsmoker.  Past Medical History  Diagnosis Date  . Asthma   . GERD (gastroesophageal reflux disease)   . Hypertension   . High cholesterol   . Kidney disease   . Kidney stones   . History of blood transfusion   . Heavy menstrual bleeding   . Prediabetes   . Hyperlipemia 12/07/2012    Past Surgical History  Procedure Laterality Date  . Abdominal hysterectomy  2000  . Left shoulder surgery  08/2005  . Tubal ligation  11/1986  . Ectopic pregnancy surgery  02/1982    Social History   Social History  . Marital Status: Married    Spouse Name: N/A  . Number of Children: 3  . Years of Education: N/A   Occupational History  . Not on file.   Social History Main Topics  . Smoking status: Never Smoker   . Smokeless tobacco: Not on file  . Alcohol Use: 0.0 oz/week    0 Standard drinks or equivalent per week     Comment: occ - 1 drink rarely  . Drug Use: No  . Sexual Activity: Not on file   Other Topics Concern  . 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 Hypertension Mother   . Breast cancer Maternal Aunt   . Deep vein thrombosis Daughter   . Hyperlipidemia Daughter   . Hypertension Daughter   . Prostate cancer Maternal Grandmother     Allergies as of 11/19/2014 - Review Complete 11/19/2014  Allergen Reaction Noted  . Atacand [candesartan] Anaphylaxis 12/07/2012  . Lisinopril Anaphylaxis 12/07/2012  . Motrin [ibuprofen] Anaphylaxis 12/07/2012  . Nsaids Anaphylaxis 12/07/2012  . Penicillins Rash 12/07/2012    Current Outpatient Prescriptions on File Prior to Visit  Medication Sig Dispense Refill  . albuterol (PROAIR HFA) 108 (90 BASE) MCG/ACT inhaler Inhale 2 puffs into the lungs every 6 (six) hours as needed for wheezing. 1 Inhaler 4  . amLODipine (NORVASC) 10 MG tablet Take 1 tablet (10 mg total) by mouth daily. 30 tablet 3  . atorvastatin (LIPITOR) 40 MG tablet Take 1 tablet (40 mg total) by mouth daily. 90 tablet 1  . carvedilol (COREG) 25 MG tablet Take 1 tablet (25 mg total) by mouth 2 (two) times daily with a meal. 60 tablet 3  . furosemide (LASIX) 40 MG tablet TAKE 1 TABLET BY MOUTH 2 TIMES DAILY. (Patient  taking differently: Take 80 mg by mouth 2 (two) times daily. TAKE 1 TABLET BY MOUTH 2 TIMES DAILY.) 60 tablet 3  . minoxidil (LONITEN) 2.5 MG tablet Take by mouth 2 (two) times daily.    Marland Kitchen oxyCODONE-acetaminophen (PERCOCET/ROXICET) 5-325 MG per tablet Take 1 tablet by mouth 2 (two) times daily as needed for severe pain. (Patient not taking: Reported on 11/19/2014) 12 tablet 0   No current facility-administered medications on file prior to visit.     REVIEW OF SYSTEMS: Cardiovascular: No chest pain, chest pressure, palpitations, orthopnea, or dyspnea on exertion. No claudication or rest pain,  No history of DVT or phlebitis.  Positive for pain in legs on walking Pulmonary: No productive cough or wheezing.  Positive for asthma Neurologic: No weakness,  paresthesias, aphasia, or amaurosis. No dizziness. Hematologic: No bleeding problems or clotting disorders. Musculoskeletal: No joint pain or joint swelling. Gastrointestinal: No blood in stool or hematemesis Genitourinary: No dysuria or hematuria. Psychiatric:: No history of major depression. Integumentary: No rashes or ulcers. Constitutional: No fever or chills.  PHYSICAL EXAMINATION:  Filed Vitals:   11/19/14 1022  BP: 160/84  Pulse: 76  Temp: 97.9 F (36.6 C)  TempSrc: Oral  Resp: 16  Height: 5\' 2"  (1.575 m)  Weight: 189 lb 8 oz (85.957 kg)  SpO2: 100%   Body mass index is 34.65 kg/(m^2). General: The patient appears their stated age.   HEENT:  No gross abnormalities Pulmonary: Respirations are non-labored Abdomen: Soft and non-tender  Musculoskeletal: There are no major deformities.   Neurologic: No focal weakness or paresthesias are detected, Skin: There are no ulcer or rashes noted. Psychiatric: The patient has normal affect. Cardiovascular: There is a regular rate and rhythm without significant murmur appreciated.  Palpable right radial pulse  Diagnostic Studies: I have reviewed her vein mapping which shows small cephalic veins bilaterally.  She does have an adequate right basilic vein.  There is a high bifurcation on the left.   Assessment:  Chronic renal insufficiency, stage IV Plan: Patient has had significant drop in the kidney function.  Access planning has been recommended.  After reviewing her vein mapping, I feel the best operation is going to be a first stage basilic vein transposition on the right.  I discussed the details of the procedure including the need for a second operation at the vein matures.  We also discussed the possibility of fistula failure as well as steal syndrome.  The patient is been scheduled for a right first stage basilic vein transposition on Thursday, September 15.     Eldridge Abrahams, M.D. [facility]  [place] Office: [phone] Pager:  [phone]

## 2014-12-06 NOTE — Transfer of Care (Signed)
Immediate Anesthesia Transfer of Care Note  Patient: Rachael George  Procedure(s) Performed: Procedure(s): FIRST STAGE BASILIC VEIN TRANSPOSITION - RIGHT (Right)  Patient Location: PACU  Anesthesia Type:MAC and Regional  Level of Consciousness: awake, alert  and oriented  Airway & Oxygen Therapy: Patient Spontanous Breathing and Patient connected to nasal cannula oxygen  Post-op Assessment: Report given to RN  Post vital signs: Reviewed and stable  Last Vitals:  Filed Vitals:   12/06/14 1300  BP: 127/72  Pulse: 65  Temp:   Resp: 15    Complications: No apparent anesthesia complications

## 2014-12-06 NOTE — Interval H&P Note (Signed)
History and Physical Interval Note:  12/06/2014 2:41 PM  Rachael George  has presented today for surgery, with the diagnosis of     The various methods of treatment have been discussed with the patient and family. After consideration of risks, benefits and other options for treatment, the patient has consented to  Procedure(s): FIRST STAGE BASILIC VEIN TRANSPOSITION (Right) as a surgical intervention .  The patient's history has been reviewed, patient examined, no change in status, stable for surgery.  I have reviewed the patient's chart and labs.  Questions were answered to the patient's satisfaction.     Annamarie Major

## 2014-12-06 NOTE — Anesthesia Postprocedure Evaluation (Signed)
  Anesthesia Post-op Note  Patient: Rachael George  Procedure(s) Performed: Procedure(s): FIRST STAGE BASILIC VEIN TRANSPOSITION - RIGHT (Right)  Patient Location: PACU  Anesthesia Type: MAC with block  Level of Consciousness: awake and alert   Airway and Oxygen Therapy: Patient Spontanous Breathing  Post-op Pain: Controlled  Post-op Assessment: Post-op Vital signs reviewed, Patient's Cardiovascular Status Stable and Respiratory Function Stable  Post-op Vital Signs: Reviewed  Filed Vitals:   12/06/14 1730  BP: 129/77  Pulse: 86  Temp:   Resp: 15    Complications: No apparent anesthesia complications

## 2014-12-06 NOTE — Progress Notes (Signed)
Orthopedic Tech Progress Note Patient Details:  Rachael George 07/13/1955 [id number]  Ortho Devices Type of Ortho Device: Arm sling Ortho Device/Splint Interventions: Application   Irish Elders 12/06/2014, 5:31 PM

## 2014-12-06 NOTE — Anesthesia Preprocedure Evaluation (Addendum)
Anesthesia Evaluation  Patient identified by MRN, date of birth, ID band Patient awake    Reviewed: Allergy & Precautions, NPO status , Patient's Chart, lab work & pertinent test results, reviewed documented beta blocker date and time   History of Anesthesia Complications Negative for: history of anesthetic complications  Airway Mallampati: II  TM Distance: >3 FB Neck ROM: Full    Dental  (+) Dental Advisory Given, Missing   Pulmonary asthma ,    Pulmonary exam normal breath sounds clear to auscultation       Cardiovascular Exercise Tolerance: Poor hypertension, Pt. on medications and Pt. on home beta blockers (-) angina(-) Past MI Normal cardiovascular exam Rhythm:Regular Rate:Normal     Neuro/Psych negative neurological ROS     GI/Hepatic Neg liver ROS, GERD  Medicated,  Endo/Other  negative endocrine ROSObesity   Renal/GU ESRFRenal disease     Musculoskeletal   Abdominal   Peds  Hematology  (+) Blood dyscrasia, anemia ,   Anesthesia Other Findings Day of surgery medications reviewed with the patient.  Reproductive/Obstetrics                            Anesthesia Physical Anesthesia Plan  ASA: III  Anesthesia Plan: Regional and MAC   Post-op Pain Management: MAC Combined w/ Regional for Post-op pain   Induction: Intravenous  Airway Management Planned: Nasal Cannula  Additional Equipment:   Intra-op Plan:   Post-operative Plan:   Informed Consent: I have reviewed the patients History and Physical, chart, labs and discussed the procedure including the risks, benefits and alternatives for the proposed anesthesia with the patient or authorized representative who has indicated his/her understanding and acceptance.   Dental advisory given  Plan Discussed with:   Anesthesia Plan Comments: (Risks/benefits of regional block discussed with patient including risk of bleeding,  infection, nerve damage, and possibility of failed block.  Also discussed backup plan of general anesthesia and associated risks.  Patient wishes to proceed.)        Anesthesia Quick Evaluation

## 2014-12-07 ENCOUNTER — Encounter (HOSPITAL_COMMUNITY): Payer: Self-pay | Admitting: Surgery

## 2014-12-07 ENCOUNTER — Other Ambulatory Visit: Payer: Self-pay | Admitting: *Deleted

## 2014-12-07 DIAGNOSIS — Z4931 Encounter for adequacy testing for hemodialysis: Secondary | ICD-10-CM

## 2014-12-07 DIAGNOSIS — N186 End stage renal disease: Secondary | ICD-10-CM

## 2014-12-10 ENCOUNTER — Telehealth: Payer: Self-pay | Admitting: Surgery

## 2014-12-10 NOTE — Telephone Encounter (Signed)
Spoke with pt to schedule, dpm °

## 2014-12-10 NOTE — Telephone Encounter (Signed)
-----   Message from Mena Goes, RN sent at 12/07/2014 11:09 AM EDT ----- Regarding: schedule   ----- Message -----    From: Serafina Mitchell, MD    Sent: 12/06/2014   4:50 PM      To: [facility] Charge Pool  12/06/2014:  Surgeon:  Annamarie Major Assistants:  Gerri Lins Procedure:   First stage right basilic vein transposition   F/u 6 weeks with duplex of fistula

## 2015-01-02 ENCOUNTER — Encounter: Payer: Self-pay | Admitting: Internal Medicine

## 2015-01-02 ENCOUNTER — Ambulatory Visit: Payer: No Typology Code available for payment source | Attending: Internal Medicine | Admitting: Internal Medicine

## 2015-01-02 VITALS — BP 151/94 | HR 78 | Temp 98.6°F | Resp 16 | Ht 62.0 in | Wt 187.0 lb

## 2015-01-02 DIAGNOSIS — N184 Chronic kidney disease, stage 4 (severe): Secondary | ICD-10-CM

## 2015-01-02 DIAGNOSIS — I129 Hypertensive chronic kidney disease with stage 1 through stage 4 chronic kidney disease, or unspecified chronic kidney disease: Secondary | ICD-10-CM | POA: Diagnosis present

## 2015-01-02 DIAGNOSIS — Z79899 Other long term (current) drug therapy: Secondary | ICD-10-CM | POA: Insufficient documentation

## 2015-01-02 DIAGNOSIS — N189 Chronic kidney disease, unspecified: Secondary | ICD-10-CM | POA: Diagnosis not present

## 2015-01-02 DIAGNOSIS — J45909 Unspecified asthma, uncomplicated: Secondary | ICD-10-CM | POA: Insufficient documentation

## 2015-01-02 DIAGNOSIS — E785 Hyperlipidemia, unspecified: Secondary | ICD-10-CM | POA: Diagnosis not present

## 2015-01-02 DIAGNOSIS — I10 Essential (primary) hypertension: Secondary | ICD-10-CM | POA: Diagnosis not present

## 2015-01-02 NOTE — Progress Notes (Signed)
Patient ID: Rachael George, female   DOB: 1955-05-15, 59 y.o.   MRN: [id number] Subjective:  Rachael George is a 59 y.o. female with hypertension, CKD ready to begin hemodialysis, and asthma. Patient reports that she had a right upper arm fistula placed last month for dialysis access. She is due to to return to vascular next month for a repeat surgery on her fistula. She states that she has been checking her pressures which have all been under [id number] systolic. She notes that her car broke down and she had to ride 2 buses and walk some to get to clinic today.   Current Outpatient Prescriptions  Medication Sig Dispense Refill  . allopurinol (ZYLOPRIM) 100 MG tablet Take 100 mg by mouth 2 (two) times daily.    Marland Kitchen amLODipine (NORVASC) 10 MG tablet Take 1 tablet (10 mg total) by mouth daily. 30 tablet 3  . atorvastatin (LIPITOR) 40 MG tablet Take 1 tablet (40 mg total) by mouth daily. 90 tablet 1  . calcium-vitamin D (OSCAL WITH D) 500-200 MG-UNIT per tablet Take 1 tablet by mouth daily with breakfast.    . carvedilol (COREG) 25 MG tablet Take 1 tablet (25 mg total) by mouth 2 (two) times daily with a meal. 60 tablet 3  . furosemide (LASIX) 40 MG tablet TAKE 1 TABLET BY MOUTH 2 TIMES DAILY. (Patient taking differently: Take 80 mg by mouth 2 (two) times daily. TAKE 1 TABLET BY MOUTH 2 TIMES DAILY.) 60 tablet 3  . minoxidil (LONITEN) 2.5 MG tablet Take 2.5 mg by mouth 2 (two) times daily.     Marland Kitchen albuterol (PROAIR HFA) 108 (90 BASE) MCG/ACT inhaler Inhale 2 puffs into the lungs every 6 (six) hours as needed for wheezing. 1 Inhaler 4  . oxyCODONE-acetaminophen (PERCOCET/ROXICET) 5-325 MG per tablet Take 1 tablet by mouth every 6 (six) hours as needed for severe pain. 20 tablet 0   No current facility-administered medications for this visit.    Hypertension ROS: taking medications as instructed, no medication side effects noted, no TIA's, no chest pain on exertion, no dyspnea on exertion, no swelling of ankles  and no palpitations.  New concerns: Needs colonoscopy and TB test completed in January so she can submit it to [facility] to be placed on Kidney Transplant list  Objective:  BP 151/94 mmHg  Pulse 78  Temp(Src) 98.6 F (37 C)  Resp 16  Ht 5\' 2"  (1.575 m)  Wt 187 lb (84.823 kg)  BMI 34.19 kg/m2  SpO2 100%  Appearance alert, well appearing, and in no distress, oriented to person, place, and time and overweight. General exam BP noted to be mildly elevated today in office though normal at other visits, S1, S2 normal, no gallop, no murmur, chest clear, no JVD, no HSM, no edema.  Lab review: labs are reviewed, up to date and normal--just had with Nephrology   Assessment:   Hypertension needs further observation and needs to follow diet more regularly.  HLD---Will recheck Lipid panel in January to see if medication is working  CKD--maintain strict control of blood pressure. Discussed dialysis and how it is very important to keep follow up appointments.  Plan:  Reviewed diet, exercise and weight control. Recommended sodium restriction. Copy of written low fat low cholesterol diet provided and reviewed. Cardiovascular risk and specific lipid/LDL goals reviewed.    Return for End of Janurary HTN/TB test.   Lance Bosch, NP 01/02/2015 3:36 PM

## 2015-01-02 NOTE — Progress Notes (Signed)
Patient here for follow up on her HTN Patient can only have blood pressures to left arm  Due to dialysis stent to right arm Patient had her flu vaccine last month at her kidney doctor

## 2015-01-14 ENCOUNTER — Telehealth: Payer: Self-pay | Admitting: Internal Medicine

## 2015-01-14 NOTE — Telephone Encounter (Signed)
Pt. Called requesting to speak to PCP regarding paperwork that PCP was going to sent to another doctor. Please f/u with pt. For more information.

## 2015-01-15 ENCOUNTER — Telehealth: Payer: Self-pay

## 2015-01-15 NOTE — Telephone Encounter (Signed)
Returned patient phone call Patient not available Left message on voice mail to return our call 

## 2015-01-15 NOTE — Telephone Encounter (Signed)
Pt. Returned call. Please f/u with pt. °

## 2015-01-16 ENCOUNTER — Encounter: Payer: Self-pay | Admitting: Surgery

## 2015-01-21 ENCOUNTER — Encounter (HOSPITAL_COMMUNITY): Payer: No Typology Code available for payment source

## 2015-01-21 ENCOUNTER — Ambulatory Visit (HOSPITAL_COMMUNITY)
Admission: RE | Admit: 2015-01-21 | Discharge: 2015-01-21 | Disposition: A | Discharge status: Home / Self Care | Payer: No Typology Code available for payment source | Source: Ambulatory Visit | Attending: Surgery | Admitting: Surgery

## 2015-01-21 ENCOUNTER — Encounter: Payer: No Typology Code available for payment source | Admitting: Surgery

## 2015-01-21 DIAGNOSIS — R7303 Prediabetes: Secondary | ICD-10-CM | POA: Diagnosis not present

## 2015-01-21 DIAGNOSIS — Z4931 Encounter for adequacy testing for hemodialysis: Secondary | ICD-10-CM | POA: Insufficient documentation

## 2015-01-21 DIAGNOSIS — E785 Hyperlipidemia, unspecified: Secondary | ICD-10-CM | POA: Insufficient documentation

## 2015-01-21 DIAGNOSIS — N186 End stage renal disease: Secondary | ICD-10-CM | POA: Diagnosis not present

## 2015-01-21 DIAGNOSIS — I120 Hypertensive chronic kidney disease with stage 5 chronic kidney disease or end stage renal disease: Secondary | ICD-10-CM | POA: Diagnosis not present

## 2015-01-31 ENCOUNTER — Encounter: Payer: Self-pay | Admitting: Surgery

## 2015-02-04 ENCOUNTER — Encounter: Payer: Self-pay | Admitting: Surgery

## 2015-02-04 ENCOUNTER — Other Ambulatory Visit: Payer: Self-pay | Admitting: Internal Medicine

## 2015-02-04 ENCOUNTER — Other Ambulatory Visit: Payer: Self-pay

## 2015-02-04 ENCOUNTER — Ambulatory Visit (INDEPENDENT_AMBULATORY_CARE_PROVIDER_SITE_OTHER): Payer: Self-pay | Admitting: Surgery

## 2015-02-04 VITALS — BP 141/71 | HR 83 | Ht 62.0 in | Wt 187.0 lb

## 2015-02-04 DIAGNOSIS — N184 Chronic kidney disease, stage 4 (severe): Secondary | ICD-10-CM

## 2015-02-04 NOTE — Progress Notes (Signed)
Patient name: Rachael George MRN: [id number] DOB: [birth year] Sex: female     Chief Complaint  Patient presents with  . Re-evaluation    s/p 1st stage R BVT - 6 wk f/u - c/o numbness in R LE since procedure    HISTORY OF PRESENT ILLNESS: The patient is back for follow-up.  She is status post right first stage basilic vein transposition on 12/06/2014.  She has no symptoms of steal.  She does have some numbness along the inner aspect of her forearm.  Past Medical History  Diagnosis Date  . Asthma   . GERD (gastroesophageal reflux disease)   . Hypertension   . High cholesterol   . Kidney disease   . Kidney stones   . History of blood transfusion   . Heavy menstrual bleeding   . Prediabetes   . Hyperlipemia 12/07/2012  . Gout   . Anemia   . RA (rheumatoid arthritis) [facility])     Past Surgical History  Procedure Laterality Date  . Abdominal hysterectomy  2000  . Left shoulder surgery  08/2005  . Tubal ligation  11/1986  . Ectopic pregnancy surgery  02/1982  . Bascilic vein transposition Right 12/06/2014    Procedure: FIRST STAGE BASILIC VEIN TRANSPOSITION - RIGHT;  Surgeon: Serafina Mitchell, MD;  Location: [facility] OR;  Service: Vascular;  Laterality: Right;    Social History   Social History  . Marital Status: Married    Spouse Name: N/A  . Number of Children: 3  . Years of Education: N/A   Occupational History  . Not on file.   Social History Main Topics  . Smoking status: Never Smoker   . Smokeless tobacco: Never Used  . Alcohol Use: 0.0 oz/week    0 Standard drinks or equivalent per week     Comment: occ - 1 drink rarely  . Drug Use: No  . Sexual Activity: Not on file   Other Topics Concern  . 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 Hypertension Mother   . Breast cancer Maternal Aunt   . Deep vein thrombosis Daughter   . Hyperlipidemia Daughter   . Hypertension Daughter   . Prostate cancer Maternal Grandmother     Allergies as of 02/04/2015 - Review Complete 02/04/2015  Allergen Reaction Noted  . Atacand [candesartan] Anaphylaxis 12/07/2012  . Lisinopril Anaphylaxis 12/07/2012  . Motrin [ibuprofen] Anaphylaxis 12/07/2012  . Nsaids Anaphylaxis 12/07/2012  . Tolmetin Anaphylaxis 02/04/2015  . Penicillins Rash 12/07/2012    Current Outpatient Prescriptions on File Prior to Visit  Medication Sig Dispense Refill  . albuterol (PROAIR HFA) 108 (90 BASE) MCG/ACT inhaler Inhale 2 puffs into the lungs every 6 (six) hours as needed for wheezing. 1 Inhaler 4  . allopurinol (ZYLOPRIM) 100 MG tablet Take 100 mg by mouth 2 (two) times daily.    Marland Kitchen amLODipine (NORVASC) 10 MG tablet Take 1 tablet (10 mg total) by mouth daily. 30 tablet 3  . atorvastatin (LIPITOR) 40 MG tablet Take 1 tablet (40 mg total) by mouth daily. 90 tablet 1  .  calcium-vitamin D (OSCAL WITH D) 500-200 MG-UNIT per tablet Take 1 tablet by mouth daily with breakfast.    . carvedilol (COREG) 25 MG tablet Take 1 tablet (25 mg total) by mouth 2 (two) times daily with a meal. 60 tablet 3  . furosemide (LASIX) 40 MG tablet TAKE 1 TABLET BY MOUTH 2 TIMES DAILY. (Patient taking differently: Take 80 mg by mouth 2 (two) times daily. TAKE 1 TABLET BY MOUTH 2 TIMES DAILY.) 60 tablet 3  . minoxidil (LONITEN) 2.5 MG tablet Take 2.5 mg by mouth 2 (two) times daily.     Marland Kitchen oxyCODONE-acetaminophen (PERCOCET/ROXICET) 5-325 MG per tablet Take 1 tablet by mouth every 6 (six) hours as needed for severe pain. (Patient not taking: Reported on 02/04/2015) 20 tablet 0   No current facility-administered medications on file prior to visit.     REVIEW OF SYSTEMS: See above  PHYSICAL EXAMINATION:   Vital signs are  Filed Vitals:    02/04/15 0854 02/04/15 0857  BP: 152/81 141/71  Pulse: 83   Height: 5\' 2"  (1.575 m)   Weight: 187 lb (84.823 kg)   SpO2: 100%    Body mass index is 34.19 kg/(m^2). General: The patient appears their stated age. HEENT:  No gross abnormalities Pulmonary:  Non labored breathing Musculoskeletal: There are no major deformities. Neurologic: No focal weakness or paresthesias are detected, Skin: There are no ulcer or rashes noted. Psychiatric: The patient has normal affect. Cardiovascular: Palpable thrill within right basilic vein.  Incisions are healed   Diagnostic Studies Duplex of the fistula reveals excellent caliber vein ranging in size from 0.55-0.93 cm with the exception of a 2 cm section slightly proximal to the anastomosis.  Assessment: Status post first stage basilic vein transposition Plan: The patient will be scheduled for a second stage basilic vein transposition.  More than likely I will need to resect a 2 cm segment which correlates to the ultrasound findings of narrowing.  Eldridge Abrahams, M.D. [facility] [place] Office: [phone] Pager:  [phone]

## 2015-02-06 ENCOUNTER — Encounter (HOSPITAL_COMMUNITY): Payer: Self-pay | Admitting: *Deleted

## 2015-02-06 MED ORDER — SODIUM CHLORIDE 0.9 % IV SOLN
INTRAVENOUS | Status: DC
  Administered 2015-02-07 (×3): via INTRAVENOUS

## 2015-02-06 MED ORDER — CHLORHEXIDINE GLUCONATE CLOTH 2 % EX PADS: 6.0000 | MEDICATED_PAD | Freq: Once | CUTANEOUS | Status: DC

## 2015-02-06 MED ORDER — VANCOMYCIN HCL IN DEXTROSE 1-5 GM/200ML-% IV SOLN
1000.0000 mg | INTRAVENOUS | Status: AC
  Administered 2015-02-07: 1000 mg via INTRAVENOUS

## 2015-02-06 NOTE — Progress Notes (Signed)
Pt denies cardiac history, chest pain or sob. 

## 2015-02-07 ENCOUNTER — Ambulatory Visit (HOSPITAL_COMMUNITY): Payer: No Typology Code available for payment source | Admitting: Anesthesiology

## 2015-02-07 ENCOUNTER — Encounter (HOSPITAL_COMMUNITY)
Admission: RE | Disposition: A | Discharge status: Home / Self Care | Payer: Self-pay | Source: Ambulatory Visit | Attending: Surgery

## 2015-02-07 ENCOUNTER — Encounter (HOSPITAL_COMMUNITY): Payer: Self-pay | Admitting: General Practice

## 2015-02-07 ENCOUNTER — Observation Stay (HOSPITAL_COMMUNITY)
Admission: RE | Admit: 2015-02-07 | Discharge: 2015-02-08 | Disposition: A | Discharge status: Home / Self Care | Payer: No Typology Code available for payment source | Source: Ambulatory Visit | Attending: Surgery | Admitting: Surgery

## 2015-02-07 DIAGNOSIS — E785 Hyperlipidemia, unspecified: Secondary | ICD-10-CM | POA: Diagnosis not present

## 2015-02-07 DIAGNOSIS — M069 Rheumatoid arthritis, unspecified: Secondary | ICD-10-CM | POA: Insufficient documentation

## 2015-02-07 DIAGNOSIS — N184 Chronic kidney disease, stage 4 (severe): Secondary | ICD-10-CM | POA: Insufficient documentation

## 2015-02-07 DIAGNOSIS — E7800 Pure hypercholesterolemia, unspecified: Secondary | ICD-10-CM | POA: Diagnosis not present

## 2015-02-07 DIAGNOSIS — Z6834 Body mass index (BMI) 34.0-34.9, adult: Secondary | ICD-10-CM | POA: Insufficient documentation

## 2015-02-07 DIAGNOSIS — T82898A Other specified complication of vascular prosthetic devices, implants and grafts, initial encounter: Secondary | ICD-10-CM | POA: Diagnosis not present

## 2015-02-07 DIAGNOSIS — Z79899 Other long term (current) drug therapy: Secondary | ICD-10-CM | POA: Insufficient documentation

## 2015-02-07 DIAGNOSIS — R7303 Prediabetes: Secondary | ICD-10-CM | POA: Diagnosis not present

## 2015-02-07 DIAGNOSIS — T148XXA Other injury of unspecified body region, initial encounter: Secondary | ICD-10-CM

## 2015-02-07 DIAGNOSIS — T82868A Thrombosis of vascular prosthetic devices, implants and grafts, initial encounter: Secondary | ICD-10-CM | POA: Diagnosis not present

## 2015-02-07 DIAGNOSIS — K219 Gastro-esophageal reflux disease without esophagitis: Secondary | ICD-10-CM | POA: Insufficient documentation

## 2015-02-07 DIAGNOSIS — I129 Hypertensive chronic kidney disease with stage 1 through stage 4 chronic kidney disease, or unspecified chronic kidney disease: Secondary | ICD-10-CM | POA: Diagnosis not present

## 2015-02-07 DIAGNOSIS — N185 Chronic kidney disease, stage 5: Secondary | ICD-10-CM | POA: Diagnosis not present

## 2015-02-07 DIAGNOSIS — Y832 Surgical operation with anastomosis, bypass or graft as the cause of abnormal reaction of the patient, or of later complication, without mention of misadventure at the time of the procedure: Secondary | ICD-10-CM | POA: Diagnosis not present

## 2015-02-07 DIAGNOSIS — M109 Gout, unspecified: Secondary | ICD-10-CM | POA: Diagnosis not present

## 2015-02-07 DIAGNOSIS — D631 Anemia in chronic kidney disease: Secondary | ICD-10-CM | POA: Diagnosis not present

## 2015-02-07 DIAGNOSIS — J45909 Unspecified asthma, uncomplicated: Secondary | ICD-10-CM | POA: Diagnosis not present

## 2015-02-07 DIAGNOSIS — N186 End stage renal disease: Secondary | ICD-10-CM | POA: Diagnosis present

## 2015-02-07 DIAGNOSIS — E669 Obesity, unspecified: Secondary | ICD-10-CM | POA: Diagnosis not present

## 2015-02-07 DIAGNOSIS — L7632 Postprocedural hematoma of skin and subcutaneous tissue following other procedure: Secondary | ICD-10-CM | POA: Insufficient documentation

## 2015-02-07 HISTORY — PX: I & D EXTREMITY: SHX5045

## 2015-02-07 HISTORY — PX: BASCILIC VEIN TRANSPOSITION: SHX5742

## 2015-02-07 LAB — POCT I-STAT 4, (NA,K, GLUC, HGB,HCT)
Glucose, Bld: 97 mg/dL (ref 65–99)
HCT: 32 % — ABNORMAL LOW (ref 36.0–46.0)
Hemoglobin: 10.9 g/dL — ABNORMAL LOW (ref 12.0–15.0)
Potassium: 4.7 mmol/L (ref 3.5–5.1)
Sodium: 142 mmol/L (ref 135–145)

## 2015-02-07 SURGERY — IRRIGATION AND DEBRIDEMENT EXTREMITY
Anesthesia: General | Laterality: Right

## 2015-02-07 SURGERY — TRANSPOSITION, VEIN, BASILIC
Anesthesia: General | Site: Arm Upper | Laterality: Right

## 2015-02-07 MED ORDER — ARTIFICIAL TEARS OP OINT
TOPICAL_OINTMENT | OPHTHALMIC | Status: AC
  Filled 2015-02-07: qty 3.5

## 2015-02-07 MED ORDER — PROPOFOL 10 MG/ML IV BOLUS
INTRAVENOUS | Status: DC | PRN
  Administered 2015-02-07: 130 mg via INTRAVENOUS
  Administered 2015-02-07: 30 mg via INTRAVENOUS

## 2015-02-07 MED ORDER — AMLODIPINE BESYLATE 10 MG PO TABS
10.0000 mg | ORAL_TABLET | Freq: Every day | ORAL | Status: DC
  Filled 2015-02-07: qty 2

## 2015-02-07 MED ORDER — FUROSEMIDE 80 MG PO TABS
80.0000 mg | ORAL_TABLET | Freq: Two times a day (BID) | ORAL | Status: DC
  Administered 2015-02-08: 80 mg via ORAL
  Filled 2015-02-07: qty 1

## 2015-02-07 MED ORDER — ALBUMIN HUMAN 5 % IV SOLN
INTRAVENOUS | Status: DC | PRN
  Administered 2015-02-07: 16:00:00 via INTRAVENOUS

## 2015-02-07 MED ORDER — PHENOL 1.4 % MT LIQD: 1.0000 | OROMUCOSAL | Status: DC | PRN

## 2015-02-07 MED ORDER — ONDANSETRON HCL 4 MG/2ML IJ SOLN
4.0000 mg | Freq: Four times a day (QID) | INTRAMUSCULAR | Status: DC | PRN
Start: 2015-02-07 — End: 2015-02-08
  Administered 2015-02-08 (×3): 4 mg via INTRAVENOUS
  Filled 2015-02-07 (×3): qty 2

## 2015-02-07 MED ORDER — LIDOCAINE HCL (CARDIAC) 20 MG/ML IV SOLN
INTRAVENOUS | Status: AC
  Filled 2015-02-07: qty 5

## 2015-02-07 MED ORDER — PHENYLEPHRINE HCL 10 MG/ML IJ SOLN
INTRAMUSCULAR | Status: DC | PRN
  Administered 2015-02-07 (×5): 80 ug via INTRAVENOUS

## 2015-02-07 MED ORDER — PANTOPRAZOLE SODIUM 40 MG PO TBEC
40.0000 mg | DELAYED_RELEASE_TABLET | Freq: Every day | ORAL | Status: DC
  Administered 2015-02-07: 40 mg via ORAL
  Filled 2015-02-07 (×2): qty 1

## 2015-02-07 MED ORDER — LIDOCAINE-EPINEPHRINE (PF) 1 %-1:200000 IJ SOLN
INTRAMUSCULAR | Status: AC
Start: 2015-02-07 — End: 2015-02-07
  Filled 2015-02-07: qty 30

## 2015-02-07 MED ORDER — PROTAMINE SULFATE 10 MG/ML IV SOLN
INTRAVENOUS | Status: DC | PRN
  Administered 2015-02-07: 50 mg via INTRAVENOUS

## 2015-02-07 MED ORDER — ONDANSETRON HCL 4 MG/2ML IJ SOLN
INTRAMUSCULAR | Status: AC
  Filled 2015-02-07: qty 2

## 2015-02-07 MED ORDER — 0.9 % SODIUM CHLORIDE (POUR BTL) OPTIME
TOPICAL | Status: DC | PRN
  Administered 2015-02-07: 1000 mL

## 2015-02-07 MED ORDER — MORPHINE SULFATE (PF) 2 MG/ML IV SOLN
2.0000 mg | INTRAVENOUS | Status: DC | PRN
  Administered 2015-02-07: 4 mg via INTRAVENOUS
  Filled 2015-02-07: qty 2

## 2015-02-07 MED ORDER — OXYCODONE-ACETAMINOPHEN 5-325 MG PO TABS
1.0000 | ORAL_TABLET | ORAL | Status: DC | PRN
  Administered 2015-02-07: 1 via ORAL
  Administered 2015-02-08: 2 via ORAL
  Filled 2015-02-07 (×2): qty 2

## 2015-02-07 MED ORDER — HEPARIN SODIUM (PORCINE) 1000 UNIT/ML IJ SOLN
INTRAMUSCULAR | Status: AC
  Filled 2015-02-07: qty 1

## 2015-02-07 MED ORDER — LABETALOL HCL 5 MG/ML IV SOLN: 10.0000 mg | INTRAVENOUS | Status: DC | PRN

## 2015-02-07 MED ORDER — SODIUM CHLORIDE 0.9 % IJ SOLN
INTRAMUSCULAR | Status: AC
  Filled 2015-02-07: qty 20

## 2015-02-07 MED ORDER — PHENYLEPHRINE HCL 10 MG/ML IJ SOLN
INTRAMUSCULAR | Status: DC | PRN
  Administered 2015-02-07: 80 ug via INTRAVENOUS

## 2015-02-07 MED ORDER — ALUM & MAG HYDROXIDE-SIMETH 200-200-20 MG/5ML PO SUSP: 15.0000 mL | ORAL | Status: DC | PRN

## 2015-02-07 MED ORDER — MINOXIDIL 2.5 MG PO TABS
2.5000 mg | ORAL_TABLET | Freq: Two times a day (BID) | ORAL | Status: DC
  Administered 2015-02-07: 2.5 mg via ORAL
  Filled 2015-02-07 (×3): qty 1

## 2015-02-07 MED ORDER — HYDROMORPHONE HCL 1 MG/ML IJ SOLN: 0.2500 mg | INTRAMUSCULAR | Status: DC | PRN

## 2015-02-07 MED ORDER — OXYCODONE-ACETAMINOPHEN 5-325 MG PO TABS: 1.0000 | ORAL_TABLET | ORAL | Status: DC | PRN

## 2015-02-07 MED ORDER — MAGNESIUM SULFATE 2 GM/50ML IV SOLN
2.0000 g | Freq: Every day | INTRAVENOUS | Status: DC | PRN
  Filled 2015-02-07: qty 50

## 2015-02-07 MED ORDER — VANCOMYCIN HCL IN DEXTROSE 1-5 GM/200ML-% IV SOLN
INTRAVENOUS | Status: AC
  Filled 2015-02-07: qty 200

## 2015-02-07 MED ORDER — POTASSIUM CHLORIDE CRYS ER 20 MEQ PO TBCR: 20.0000 meq | EXTENDED_RELEASE_TABLET | Freq: Every day | ORAL | Status: DC | PRN

## 2015-02-07 MED ORDER — GUAIFENESIN-DM 100-10 MG/5ML PO SYRP: 15.0000 mL | ORAL_SOLUTION | ORAL | Status: DC | PRN

## 2015-02-07 MED ORDER — PHENYLEPHRINE HCL 10 MG/ML IJ SOLN
10.0000 mg | INTRAVENOUS | Status: DC | PRN
  Administered 2015-02-07: 50 ug/min via INTRAVENOUS

## 2015-02-07 MED ORDER — GLYCOPYRROLATE 0.2 MG/ML IJ SOLN
INTRAMUSCULAR | Status: DC | PRN
  Administered 2015-02-07: .2 mg via INTRAVENOUS

## 2015-02-07 MED ORDER — FENTANYL CITRATE (PF) 100 MCG/2ML IJ SOLN
INTRAMUSCULAR | Status: DC | PRN
  Administered 2015-02-07 (×3): 50 ug via INTRAVENOUS

## 2015-02-07 MED ORDER — PROPOFOL 10 MG/ML IV BOLUS
INTRAVENOUS | Status: DC | PRN
  Administered 2015-02-07: 30 mg via INTRAVENOUS
  Administered 2015-02-07: 150 mg via INTRAVENOUS

## 2015-02-07 MED ORDER — ALBUTEROL SULFATE (2.5 MG/3ML) 0.083% IN NEBU: 3.0000 mL | INHALATION_SOLUTION | Freq: Four times a day (QID) | RESPIRATORY_TRACT | Status: DC | PRN

## 2015-02-07 MED ORDER — HEPARIN SODIUM (PORCINE) 5000 UNIT/ML IJ SOLN
INTRAMUSCULAR | Status: DC | PRN
  Administered 2015-02-07: 500 mL

## 2015-02-07 MED ORDER — LIDOCAINE HCL (CARDIAC) 20 MG/ML IV SOLN
INTRAVENOUS | Status: DC | PRN
  Administered 2015-02-07: 100 mg via INTRAVENOUS

## 2015-02-07 MED ORDER — ATORVASTATIN CALCIUM 40 MG PO TABS
40.0000 mg | ORAL_TABLET | Freq: Every day | ORAL | Status: DC
  Administered 2015-02-07: 40 mg via ORAL
  Filled 2015-02-07 (×2): qty 1

## 2015-02-07 MED ORDER — SODIUM CHLORIDE 0.9 % IV SOLN: 500.0000 mL | Freq: Once | INTRAVENOUS | Status: DC | PRN

## 2015-02-07 MED ORDER — PROPOFOL 10 MG/ML IV BOLUS
INTRAVENOUS | Status: AC
  Filled 2015-02-07: qty 20

## 2015-02-07 MED ORDER — EPHEDRINE SULFATE 50 MG/ML IJ SOLN
INTRAMUSCULAR | Status: DC | PRN
  Administered 2015-02-07 (×2): 10 mg via INTRAVENOUS

## 2015-02-07 MED ORDER — ONDANSETRON HCL 4 MG/2ML IJ SOLN
INTRAMUSCULAR | Status: DC | PRN
  Administered 2015-02-07: 4 mg via INTRAVENOUS

## 2015-02-07 MED ORDER — CALCITRIOL 0.25 MCG PO CAPS
0.2500 ug | ORAL_CAPSULE | Freq: Every day | ORAL | Status: DC
  Administered 2015-02-07: 0.25 ug via ORAL
  Filled 2015-02-07 (×4): qty 1

## 2015-02-07 MED ORDER — METOPROLOL TARTRATE 1 MG/ML IV SOLN: 2.0000 mg | INTRAVENOUS | Status: DC | PRN

## 2015-02-07 MED ORDER — ACETAMINOPHEN 325 MG PO TABS: 325.0000 mg | ORAL_TABLET | ORAL | Status: DC | PRN

## 2015-02-07 MED ORDER — HEMOSTATIC AGENTS (NO CHARGE) OPTIME
TOPICAL | Status: DC | PRN
Start: 2015-02-07 — End: 2015-02-07
  Administered 2015-02-07: 1 via TOPICAL

## 2015-02-07 MED ORDER — CEFAZOLIN SODIUM-DEXTROSE 2-3 GM-% IV SOLR
INTRAVENOUS | Status: DC | PRN
  Administered 2015-02-07: 2 g via INTRAVENOUS

## 2015-02-07 MED ORDER — PHENYLEPHRINE 40 MCG/ML (10ML) SYRINGE FOR IV PUSH (FOR BLOOD PRESSURE SUPPORT)
PREFILLED_SYRINGE | INTRAVENOUS | Status: AC
  Filled 2015-02-07: qty 20

## 2015-02-07 MED ORDER — FENTANYL CITRATE (PF) 250 MCG/5ML IJ SOLN
INTRAMUSCULAR | Status: AC
  Filled 2015-02-07: qty 5

## 2015-02-07 MED ORDER — ACETAMINOPHEN 325 MG RE SUPP: 325.0000 mg | RECTAL | Status: DC | PRN

## 2015-02-07 MED ORDER — DOCUSATE SODIUM 100 MG PO CAPS
100.0000 mg | ORAL_CAPSULE | Freq: Every day | ORAL | Status: DC
  Filled 2015-02-07: qty 1

## 2015-02-07 MED ORDER — HEPARIN SODIUM (PORCINE) 1000 UNIT/ML IJ SOLN
INTRAMUSCULAR | Status: DC | PRN
  Administered 2015-02-07: 8000 [IU] via INTRAVENOUS

## 2015-02-07 MED ORDER — MIDAZOLAM HCL 2 MG/2ML IJ SOLN
INTRAMUSCULAR | Status: AC
  Filled 2015-02-07: qty 2

## 2015-02-07 MED ORDER — GLYCOPYRROLATE 0.2 MG/ML IJ SOLN
INTRAMUSCULAR | Status: AC
  Filled 2015-02-07: qty 2

## 2015-02-07 MED ORDER — HYDRALAZINE HCL 20 MG/ML IJ SOLN: 5.0000 mg | INTRAMUSCULAR | Status: DC | PRN

## 2015-02-07 MED ORDER — CEFAZOLIN SODIUM-DEXTROSE 2-3 GM-% IV SOLR
INTRAVENOUS | Status: AC
  Filled 2015-02-07: qty 50

## 2015-02-07 MED ORDER — NEOSTIGMINE METHYLSULFATE 10 MG/10ML IV SOLN
INTRAVENOUS | Status: AC
  Filled 2015-02-07: qty 3

## 2015-02-07 MED ORDER — SODIUM CHLORIDE 0.9 % IV SOLN
10.0000 mg | INTRAVENOUS | Status: DC | PRN
  Administered 2015-02-07: 20 ug/min via INTRAVENOUS

## 2015-02-07 MED ORDER — SODIUM CHLORIDE 0.9 % IV SOLN
INTRAVENOUS | Status: DC
  Administered 2015-02-07: 21:00:00 via INTRAVENOUS

## 2015-02-07 MED ORDER — EPHEDRINE SULFATE 50 MG/ML IJ SOLN
INTRAMUSCULAR | Status: DC | PRN
  Administered 2015-02-07 (×3): 15 mg via INTRAVENOUS

## 2015-02-07 MED ORDER — LIDOCAINE HCL (CARDIAC) 20 MG/ML IV SOLN
INTRAVENOUS | Status: DC | PRN
  Administered 2015-02-07: 80 mg via INTRAVENOUS

## 2015-02-07 MED ORDER — ALLOPURINOL 100 MG PO TABS
100.0000 mg | ORAL_TABLET | Freq: Two times a day (BID) | ORAL | Status: DC
  Administered 2015-02-07: 100 mg via ORAL
  Filled 2015-02-07 (×2): qty 1

## 2015-02-07 MED ORDER — CARVEDILOL 25 MG PO TABS
25.0000 mg | ORAL_TABLET | Freq: Two times a day (BID) | ORAL | Status: DC
  Administered 2015-02-08: 25 mg via ORAL
  Filled 2015-02-07: qty 1

## 2015-02-07 MED ORDER — SODIUM CHLORIDE 0.9 % IV SOLN
INTRAVENOUS | Status: DC | PRN
  Administered 2015-02-07: 500 mL

## 2015-02-07 MED ORDER — MIDAZOLAM HCL 5 MG/5ML IJ SOLN
INTRAMUSCULAR | Status: DC | PRN
  Administered 2015-02-07: 2 mg via INTRAVENOUS

## 2015-02-07 SURGICAL SUPPLY — 39 items
CANISTER SUCTION 2500CC (MISCELLANEOUS) ×3 IMPLANT
CLIP TI MEDIUM 24 (CLIP) IMPLANT
CLIP TI MEDIUM 6 (CLIP) IMPLANT
CLIP TI WIDE RED SMALL 24 (CLIP) ×3 IMPLANT
CLIP TI WIDE RED SMALL 6 (CLIP) ×3 IMPLANT
COVER PROBE W GEL 5X96 (DRAPES) ×3 IMPLANT
ELECT REM PT RETURN 9FT ADLT (ELECTROSURGICAL) ×3
ELECTRODE REM PT RTRN 9FT ADLT (ELECTROSURGICAL) ×1 IMPLANT
GLOVE BIO SURGEON STRL SZ 6.5 (GLOVE) ×2 IMPLANT
GLOVE BIO SURGEON STRL SZ7.5 (GLOVE) ×3 IMPLANT
GLOVE BIO SURGEONS STRL SZ 6.5 (GLOVE) ×1
GLOVE BIOGEL PI IND STRL 6.5 (GLOVE) ×6 IMPLANT
GLOVE BIOGEL PI IND STRL 7.5 (GLOVE) ×1 IMPLANT
GLOVE BIOGEL PI INDICATOR 6.5 (GLOVE) ×12
GLOVE BIOGEL PI INDICATOR 7.5 (GLOVE) ×2
GLOVE SS BIOGEL STRL SZ 6.5 (GLOVE) ×1 IMPLANT
GLOVE SUPERSENSE BIOGEL SZ 6.5 (GLOVE) ×2
GLOVE SURG SS PI 6.5 STRL IVOR (GLOVE) ×6 IMPLANT
GLOVE SURG SS PI 7.5 STRL IVOR (GLOVE) ×3 IMPLANT
GLOVE SURG SS PI 8.0 STRL IVOR (GLOVE) ×3 IMPLANT
GOWN STRL REUS W/ TWL LRG LVL3 (GOWN DISPOSABLE) ×5 IMPLANT
GOWN STRL REUS W/ TWL XL LVL3 (GOWN DISPOSABLE) ×1 IMPLANT
GOWN STRL REUS W/TWL LRG LVL3 (GOWN DISPOSABLE) ×10
GOWN STRL REUS W/TWL XL LVL3 (GOWN DISPOSABLE) ×2
HEMOSTAT SNOW SURGICEL 2X4 (HEMOSTASIS) ×3 IMPLANT
KIT BASIN OR (CUSTOM PROCEDURE TRAY) ×3 IMPLANT
KIT ROOM TURNOVER OR (KITS) ×3 IMPLANT
LIQUID BAND (GAUZE/BANDAGES/DRESSINGS) ×3 IMPLANT
NS IRRIG 1000ML POUR BTL (IV SOLUTION) ×3 IMPLANT
PACK CV ACCESS (CUSTOM PROCEDURE TRAY) ×3 IMPLANT
PAD ARMBOARD 7.5X6 YLW CONV (MISCELLANEOUS) ×6 IMPLANT
SPONGE LAP 18X18 X RAY DECT (DISPOSABLE) ×3 IMPLANT
SUT PROLENE 6 0 CC (SUTURE) ×9 IMPLANT
SUT SILK 2 0 SH (SUTURE) ×3 IMPLANT
SUT VIC AB 3-0 SH 27 (SUTURE) ×4
SUT VIC AB 3-0 SH 27X BRD (SUTURE) ×2 IMPLANT
SUT VICRYL 4-0 PS2 18IN ABS (SUTURE) ×6 IMPLANT
UNDERPAD 30X30 INCONTINENT (UNDERPADS AND DIAPERS) ×3 IMPLANT
WATER STERILE IRR 1000ML POUR (IV SOLUTION) ×3 IMPLANT

## 2015-02-07 SURGICAL SUPPLY — 38 items
BANDAGE ELASTIC 4 VELCRO ST LF (GAUZE/BANDAGES/DRESSINGS) ×2 IMPLANT
BANDAGE ELASTIC 6 VELCRO ST LF (GAUZE/BANDAGES/DRESSINGS) IMPLANT
BNDG GAUZE ELAST 4 BULKY (GAUZE/BANDAGES/DRESSINGS) ×2 IMPLANT
CANISTER SUCTION 2500CC (MISCELLANEOUS) ×2 IMPLANT
CLIP TI MEDIUM 6 (CLIP) ×2 IMPLANT
CLIP TI WIDE RED SMALL 6 (CLIP) ×2 IMPLANT
COVER SURGICAL LIGHT HANDLE (MISCELLANEOUS) ×2 IMPLANT
DRAPE EXTREMITY BILATERAL (DRAPE) IMPLANT
DRAPE EXTREMITY T 121X128X90 (DRAPE) IMPLANT
DRAPE U-SHAPE 76X120 STRL (DRAPES) IMPLANT
ELECT REM PT RETURN 9FT ADLT (ELECTROSURGICAL) ×2
ELECTRODE REM PT RTRN 9FT ADLT (ELECTROSURGICAL) ×1 IMPLANT
EVACUATOR SILICONE 100CC (DRAIN) ×2 IMPLANT
GAUZE SPONGE 4X4 12PLY STRL (GAUZE/BANDAGES/DRESSINGS) ×2 IMPLANT
GAUZE XEROFORM 5X9 LF (GAUZE/BANDAGES/DRESSINGS) IMPLANT
GLOVE BIOGEL PI IND STRL 7.5 (GLOVE) ×1 IMPLANT
GLOVE BIOGEL PI INDICATOR 7.5 (GLOVE) ×1
GLOVE SURG SS PI 7.5 STRL IVOR (GLOVE) ×2 IMPLANT
GOWN STRL REUS W/ TWL LRG LVL3 (GOWN DISPOSABLE) ×2 IMPLANT
GOWN STRL REUS W/ TWL XL LVL3 (GOWN DISPOSABLE) ×1 IMPLANT
GOWN STRL REUS W/TWL LRG LVL3 (GOWN DISPOSABLE) ×2
GOWN STRL REUS W/TWL XL LVL3 (GOWN DISPOSABLE) ×1
KIT BASIN OR (CUSTOM PROCEDURE TRAY) ×2 IMPLANT
KIT ROOM TURNOVER OR (KITS) ×2 IMPLANT
NS IRRIG 1000ML POUR BTL (IV SOLUTION) ×2 IMPLANT
PACK CV ACCESS (CUSTOM PROCEDURE TRAY) IMPLANT
PACK GENERAL/GYN (CUSTOM PROCEDURE TRAY) IMPLANT
PACK UNIVERSAL I (CUSTOM PROCEDURE TRAY) IMPLANT
PAD ARMBOARD 7.5X6 YLW CONV (MISCELLANEOUS) ×4 IMPLANT
SPONGE GAUZE 4X4 12PLY STER LF (GAUZE/BANDAGES/DRESSINGS) ×2 IMPLANT
SPONGE LAP 18X18 X RAY DECT (DISPOSABLE) ×2 IMPLANT
SUT ETHILON 3 0 PS 1 (SUTURE) ×2 IMPLANT
SUT PROLENE 6 0 CC (SUTURE) ×4 IMPLANT
SUT VIC AB 2-0 CTX 36 (SUTURE) IMPLANT
SUT VIC AB 3-0 SH 27 (SUTURE) ×3
SUT VIC AB 3-0 SH 27X BRD (SUTURE) ×3 IMPLANT
SUT VICRYL 4-0 PS2 18IN ABS (SUTURE) ×8 IMPLANT
WATER STERILE IRR 1000ML POUR (IV SOLUTION) ×2 IMPLANT

## 2015-02-07 NOTE — Transfer of Care (Signed)
Immediate Anesthesia Transfer of Care Note  Patient: Rachael George  Procedure(s) Performed: Procedure(s): IRRIGATION AND DEBRIDEMENT RIGHT ARM HEMATOMA (Right)  Patient Location: PACU  Anesthesia Type:General  Level of Consciousness: awake, alert  and oriented  Airway & Oxygen Therapy: Patient Spontanous Breathing and Patient connected to nasal cannula oxygen  Post-op Assessment: Report given to RN and Post -op Vital signs reviewed and stable  Post vital signs: Reviewed and stable  Last Vitals:  Filed Vitals:   02/07/15 1727  BP:   Pulse: 80  Temp: 36.2 C  Resp: 11    Complications: No apparent anesthesia complications

## 2015-02-07 NOTE — Discharge Instructions (Signed)
° ° °  02/07/2015 Rachael George [id number] 07/23/1955  Surgeon(s): Serafina Mitchell, MD  Procedure(s): RIGHT ARM SECOND STAGE BASILIC VEIN TRANSPOSITION  x Do not stick graft for 12 weeks

## 2015-02-07 NOTE — Anesthesia Procedure Notes (Addendum)
Procedure Name: LMA Insertion Date/Time: 02/07/2015 1:13 PM Performed by: Mariea Clonts Pre-anesthesia Checklist: Patient identified, Emergency Drugs available, Suction available, Patient being monitored and Timeout performed Patient Re-evaluated:Patient Re-evaluated prior to inductionOxygen Delivery Method: Circle system utilized Preoxygenation: Pre-oxygenation with 100% oxygen Intubation Type: IV induction Ventilation: Mask ventilation without difficulty LMA: LMA inserted LMA Size: 4.0 Tube type: Oral Number of attempts: 1 Placement Confirmation: positive ETCO2 and breath sounds checked- equal and bilateral Tube secured with: Tape Dental Injury: Teeth and Oropharynx as per pre-operative assessment

## 2015-02-07 NOTE — Progress Notes (Signed)
Patient lying in bed. States that when she went to the bathroom she vomited-all liquid except for a few noodles from her soup.  Will continue to monitor. Call light within reach.

## 2015-02-07 NOTE — Anesthesia Preprocedure Evaluation (Addendum)
Anesthesia Evaluation  Patient identified by MRN, date of birth, ID band Patient awake    Reviewed: Allergy & Precautions, H&P , NPO status , Patient's Chart, lab work & pertinent test results, reviewed documented beta blocker date and time   Airway Mallampati: II  TM Distance: >3 FB Neck ROM: Full    Dental no notable dental hx. (+) Teeth Intact, Dental Advisory Given   Pulmonary asthma ,    Pulmonary exam normal breath sounds clear to auscultation       Cardiovascular hypertension, Pt. on medications and Pt. on home beta blockers  Rhythm:Regular Rate:Normal     Neuro/Psych negative neurological ROS  negative psych ROS   GI/Hepatic Neg liver ROS, GERD  Medicated and Controlled,  Endo/Other  negative endocrine ROS  Renal/GU CRFRenal disease  negative genitourinary   Musculoskeletal  (+) Arthritis , Rheumatoid disorders,    Abdominal   Peds  Hematology negative hematology ROS (+)   Anesthesia Other Findings   Reproductive/Obstetrics negative OB ROS                            Anesthesia Physical Anesthesia Plan  ASA: III  Anesthesia Plan: General   Post-op Pain Management:    Induction: Intravenous  Airway Management Planned: Oral ETT  Additional Equipment:   Intra-op Plan:   Post-operative Plan: Extubation in OR  Informed Consent:   Dental advisory given  Plan Discussed with: CRNA and Surgeon  Anesthesia Plan Comments:         Anesthesia Quick Evaluation

## 2015-02-07 NOTE — Interval H&P Note (Signed)
History and Physical Interval Note:  02/07/2015 11:23 AM  Rachael George  has presented today for surgery, with the diagnosis of Stage IV Chronic Kidney Disease N18.4  The various methods of treatment have been discussed with the patient and family. After consideration of risks, benefits and other options for treatment, the patient has consented to  Procedure(s): SECOND STAGE BASILIC VEIN TRANSPOSITION (Right) as a surgical intervention .  The patient's history has been reviewed, patient examined, no change in status, stable for surgery.  I have reviewed the patient's chart and labs.  Questions were answered to the patient's satisfaction.     Annamarie Major

## 2015-02-07 NOTE — Transfer of Care (Signed)
Immediate Anesthesia Transfer of Care Note  Patient: Rachael George Pt has swelling of arm with new graft, back to or

## 2015-02-07 NOTE — Progress Notes (Signed)
Upon adm to RUE very firm to touch, no bruit auscultated, 1+ palpable radial pulse, pt awake c/o pain. Dr Trula Slade notified and here to see pt within min. Pt to return to OR

## 2015-02-07 NOTE — Anesthesia Postprocedure Evaluation (Signed)
Anesthesia Post Note  Patient: Rachael George  Procedure(s) Performed: Procedure(s) (LRB): RIGHT ARM SECOND STAGE BASILIC VEIN TRANSPOSITION (Right)  Anesthesia type: General  Patient location: PACU  Post pain: Pain level controlled  Post assessment: Post-op Vital signs reviewed  Last Vitals: BP 124/70 mmHg  Pulse 70  Temp(Src) 36.8 C (Oral)  Resp 16  Ht 5\' 2"  (1.575 m)  Wt 187 lb (84.823 kg)  BMI 34.19 kg/m2  SpO2 98%  Post vital signs: Reviewed  Level of consciousness: sedated  Complications: No apparent anesthesia complications, but must return to OR for expanding hematoma at surgical site.

## 2015-02-07 NOTE — H&P (View-Only) (Signed)
Patient name: Rachael George MRN: [id number] DOB: 01-19-1956 Sex: female     Chief Complaint  Patient presents with  . Re-evaluation    s/p 1st stage R BVT - 6 wk f/u - c/o numbness in R LE since procedure    HISTORY OF PRESENT ILLNESS: The patient is back for follow-up.  She is status post right first stage basilic vein transposition on 12/06/2014.  She has no symptoms of steal.  She does have some numbness along the inner aspect of her forearm.  Past Medical History  Diagnosis Date  . Asthma   . GERD (gastroesophageal reflux disease)   . Hypertension   . High cholesterol   . Kidney disease   . Kidney stones   . History of blood transfusion   . Heavy menstrual bleeding   . Prediabetes   . Hyperlipemia 12/07/2012  . Gout   . Anemia   . RA (rheumatoid arthritis) [facility])     Past Surgical History  Procedure Laterality Date  . Abdominal hysterectomy  2000  . Left shoulder surgery  08/2005  . Tubal ligation  11/1986  . Ectopic pregnancy surgery  02/1982  . Bascilic vein transposition Right 12/06/2014    Procedure: FIRST STAGE BASILIC VEIN TRANSPOSITION - RIGHT;  Surgeon: Serafina Mitchell, MD;  Location: [facility] OR;  Service: Vascular;  Laterality: Right;    Social History   Social History  . Marital Status: Married    Spouse Name: N/A  . Number of Children: 3  . Years of Education: N/A   Occupational History  . Not on file.   Social History Main Topics  . Smoking status: Never Smoker   . Smokeless tobacco: Never Used  . Alcohol Use: 0.0 oz/week    0 Standard drinks or equivalent per week     Comment: occ - 1 drink rarely  . Drug Use: No  . Sexual Activity: Not on file   Other Topics Concern  . 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 Hypertension Mother   . Breast cancer Maternal Aunt   . Deep vein thrombosis Daughter   . Hyperlipidemia Daughter   . Hypertension Daughter   . Prostate cancer Maternal Grandmother     Allergies as of 02/04/2015 - Review Complete 02/04/2015  Allergen Reaction Noted  . Atacand [candesartan] Anaphylaxis 12/07/2012  . Lisinopril Anaphylaxis 12/07/2012  . Motrin [ibuprofen] Anaphylaxis 12/07/2012  . Nsaids Anaphylaxis 12/07/2012  . Tolmetin Anaphylaxis 02/04/2015  . Penicillins Rash 12/07/2012    Current Outpatient Prescriptions on File Prior to Visit  Medication Sig Dispense Refill  . albuterol (PROAIR HFA) 108 (90 BASE) MCG/ACT inhaler Inhale 2 puffs into the lungs every 6 (six) hours as needed for wheezing. 1 Inhaler 4  . allopurinol (ZYLOPRIM) 100 MG tablet Take 100 mg by mouth 2 (two) times daily.    Marland Kitchen amLODipine (NORVASC) 10 MG tablet Take 1 tablet (10 mg total) by mouth daily. 30 tablet 3  . atorvastatin (LIPITOR) 40 MG tablet Take 1 tablet (40 mg total) by mouth daily. 90 tablet 1  .  calcium-vitamin D (OSCAL WITH D) 500-200 MG-UNIT per tablet Take 1 tablet by mouth daily with breakfast.    . carvedilol (COREG) 25 MG tablet Take 1 tablet (25 mg total) by mouth 2 (two) times daily with a meal. 60 tablet 3  . furosemide (LASIX) 40 MG tablet TAKE 1 TABLET BY MOUTH 2 TIMES DAILY. (Patient taking differently: Take 80 mg by mouth 2 (two) times daily. TAKE 1 TABLET BY MOUTH 2 TIMES DAILY.) 60 tablet 3  . minoxidil (LONITEN) 2.5 MG tablet Take 2.5 mg by mouth 2 (two) times daily.     Marland Kitchen oxyCODONE-acetaminophen (PERCOCET/ROXICET) 5-325 MG per tablet Take 1 tablet by mouth every 6 (six) hours as needed for severe pain. (Patient not taking: Reported on 02/04/2015) 20 tablet 0   No current facility-administered medications on file prior to visit.     REVIEW OF SYSTEMS: See above  PHYSICAL EXAMINATION:   Vital signs are  Filed Vitals:    02/04/15 0854 02/04/15 0857  BP: 152/81 141/71  Pulse: 83   Height: 5\' 2"  (1.575 m)   Weight: 187 lb (84.823 kg)   SpO2: 100%    Body mass index is 34.19 kg/(m^2). General: The patient appears their stated age. HEENT:  No gross abnormalities Pulmonary:  Non labored breathing Musculoskeletal: There are no major deformities. Neurologic: No focal weakness or paresthesias are detected, Skin: There are no ulcer or rashes noted. Psychiatric: The patient has normal affect. Cardiovascular: Palpable thrill within right basilic vein.  Incisions are healed   Diagnostic Studies Duplex of the fistula reveals excellent caliber vein ranging in size from 0.55-0.93 cm with the exception of a 2 cm section slightly proximal to the anastomosis.  Assessment: Status post first stage basilic vein transposition Plan: The patient will be scheduled for a second stage basilic vein transposition.  More than likely I will need to resect a 2 cm segment which correlates to the ultrasound findings of narrowing.  Eldridge Abrahams, M.D. [facility] [place] Office: [phone] Pager:  [phone]

## 2015-02-07 NOTE — Anesthesia Postprocedure Evaluation (Signed)
  Anesthesia Post-op Note  Patient: Rachael George  Procedure(s) Performed: Procedure(s): IRRIGATION AND DEBRIDEMENT RIGHT ARM HEMATOMA (Right)  Patient Location: PACU  Anesthesia Type:General  Level of Consciousness: awake, alert  and oriented  Airway and Oxygen Therapy: Patient Spontanous Breathing and Patient connected to nasal cannula oxygen  Post-op Pain: mild  Post-op Assessment: Post-op Vital signs reviewed, Patient's Cardiovascular Status Stable, Respiratory Function Stable, Patent Airway and Pain level controlled              Post-op Vital Signs: stable  Last Vitals:  Filed Vitals:   02/07/15 1822  BP:   Pulse: 79  Temp: 36.1 C  Resp: 13    Complications: No apparent anesthesia complications

## 2015-02-07 NOTE — Anesthesia Procedure Notes (Signed)
Procedure Name: LMA Insertion Date/Time: 02/07/2015 4:01 PM Performed by: Jacquiline Doe A Pre-anesthesia Checklist: Patient identified, Emergency Drugs available, Suction available, Patient being monitored and Timeout performed Patient Re-evaluated:Patient Re-evaluated prior to inductionOxygen Delivery Method: Circle system utilized Preoxygenation: Pre-oxygenation with 100% oxygen Intubation Type: IV induction Ventilation: Mask ventilation without difficulty LMA: LMA inserted LMA Size: 4.0 Tube type: Oral Number of attempts: 1 Placement Confirmation: positive ETCO2 and breath sounds checked- equal and bilateral Tube secured with: Tape Dental Injury: Teeth and Oropharynx as per pre-operative assessment

## 2015-02-07 NOTE — Op Note (Addendum)
    Patient name: Rachael George MRN: [id number] DOB: [birth year] Sex: female  02/07/2015 Pre-operative Diagnosis: ESRD Post-operative diagnosis:  Same Surgeon:  Annamarie Major Assistants:  Silva Bandy Procedure:   1.)  Elevation  Of right bascilic vein fistula   2.)  Re-do arterial anatamosis   3.)  Branch ligation x3 Anesthesia:  General Blood Loss:  See anesthesia record Specimens:  None  Findings:  There was a segment of vein approximately 2 cm near the arterial anastomosis which was significantly under dilated compared to the rest of the fistula.  I felt it was best to exclude this portion of the vein.  I ligated the vein near the arteriovenous anastomosis.  I then used a curved tunneler to create a new tunnel and then I redid the arterial anastomosis and the more proximal brachial artery  Indications:  The patient comes in for second stage basilic vein transposition  Procedure:  The patient was identified in the holding area and taken to [address]  The patient was then placed supine on the table.  General  anesthesia was administered.  The patient was prepped and draped in the usual sterile fashion.  A time out was called and antibiotics were adminI made 2 longitudinal incisions in the arm, anterior to the basilic vein.  Through these incisions I exposed the basilic vein.  There were several large branches which required ligation between silk ties.  The area of vein of concern was near the arterial anastomosis.  This was surrounded by dense inflammatory tissue.  The vein here was only about 2 mm.  I felt the best course of action was to exclude this portion of the vein from the fistula.  I therefore ligated the fistula, near the arteriovenous anastomosis.  I marked the basilic vein with an ink pen for orientation.  I then exposed the brachial artery in a more proximal position.  I then transected the vein.  I used a curved tunneler to create a new tunnel and then the vein was brought  through the tunnel.  The patient was fully heparinized.  After the heparin circulated, the brachial artery was occluded.  A #11 blade was used to make an arteriotomy which was extended longitudinally with Potts scissors.  The vein was cut the appropriate length and a end to side anastomosis was created with running 6-0 Prolene.  Prior to completion the appropriate flushing maneuvers were performed and the anastomosis was completed.  The vein distended nicely.  There is excellent thrill within the vein.  The patient also had a multiphasic radial and ulnar artery signal.  50 mg of protamine was given.  The subcutaneous tissue was reapproximated with 3-0 Vicryl, followed by 4 Vicryl on the skin and Dermabond.  There were no immediate complications  Disposition:  To PACU in stable condition.   Theotis Burrow, M.D. [facility] [place] Office: [phone] Pager:  [phone]

## 2015-02-07 NOTE — Anesthesia Preprocedure Evaluation (Addendum)
Anesthesia Evaluation  Patient identified by MRN, date of birth, ID band Patient awake    Reviewed: Allergy & Precautions, H&P , NPO status , Patient's Chart, lab work & pertinent test results, reviewed documented beta blocker date and time   Airway Mallampati: II  TM Distance: >3 FB Neck ROM: Full    Dental no notable dental hx. (+) Teeth Intact, Dental Advisory Given   Pulmonary asthma ,    Pulmonary exam normal breath sounds clear to auscultation       Cardiovascular hypertension, Pt. on medications and Pt. on home beta blockers  Rhythm:Regular Rate:Normal     Neuro/Psych negative neurological ROS  negative psych ROS   GI/Hepatic Neg liver ROS, GERD  Medicated and Controlled,  Endo/Other  negative endocrine ROS  Renal/GU CRFRenal disease  negative genitourinary   Musculoskeletal  (+) Arthritis , Rheumatoid disorders,    Abdominal   Peds  Hematology negative hematology ROS (+)   Anesthesia Other Findings   Reproductive/Obstetrics negative OB ROS                            Anesthesia Physical Anesthesia Plan  ASA: III  Anesthesia Plan: General   Post-op Pain Management:    Induction: Intravenous  Airway Management Planned: LMA  Additional Equipment:   Intra-op Plan:   Post-operative Plan: Extubation in OR  Informed Consent: I have reviewed the patients History and Physical, chart, labs and discussed the procedure including the risks, benefits and alternatives for the proposed anesthesia with the patient or authorized representative who has indicated his/her understanding and acceptance.   Dental advisory given  Plan Discussed with: CRNA  Anesthesia Plan Comments:         Anesthesia Quick Evaluation

## 2015-02-07 NOTE — Transfer of Care (Deleted)
Immediate Anesthesia Transfer of Care Note  Patient: Rachael George  Procedure(s) Performed: Procedure(s): IRRIGATION AND DEBRIDEMENT RIGHT ARM HEMATOMA (Right)  Patient Location: PACU  Anesthesia Type:General  Level of Consciousness: awake, oriented, sedated, patient cooperative and responds to stimulation  Airway & Oxygen Therapy: Patient Spontanous Breathing and Patient connected to nasal cannula oxygen  Post-op Assessment: Report given to RN, Post -op Vital signs reviewed and stable, Patient moving all extremities and Patient moving all extremities X 4  Post vital signs: Reviewed and stable  Last Vitals:  Filed Vitals:   02/07/15 1003  BP: 124/70  Pulse: 70  Temp: 36.8 C  Resp: 16    Complications: No apparent anesthesia complications

## 2015-02-07 NOTE — H&P (Signed)
Patient arrived to PACU and developed a fullness in the right arm consistent with hematoma.  I have recommended going back to the OR for exploration.  Patient agrees   Rachael George

## 2015-02-07 NOTE — Op Note (Signed)
    Patient name: Rachael George MRN: [id number] DOB: March 13, 1956 Sex: female  02/07/2015 Pre-operative Diagnosis: righ arm hematoma Post-operative diagnosis:  Same Surgeon:  Annamarie Major Assistants:  Silva Bandy Procedure:   1.)Evacuation of right arm hematoma  2.)  Re-exploration of right arm fistula Anesthesia:  General Blood Loss:  See anesthesia record Specimens:  none  Findings:  No obvious source of bleeding.  [place] drain palkced  Indications:  The patient returned to the PACU following a 2nd stage BVT.  She acutely developed swelling in her arm consistent with a hematoma.  She was brought back to the OR emergently  Procedure:  The patient was identified in the holding area and taken to [facility] [facility]  The patient was then placed supine on the table. general anesthesia was administered.  The patient was prepped and draped in the usual sterile fashion.  A time out was called and antibiotics were administered.  The previous 2 incisions were opened.  Acute clot was evacuated.  No obvious source of bleeding was identified.  We searched for 30 minutes.  I inspected the anastamosis, the tunnel, and the axilla, as well as the branches that were divided.  I could not find any source.  Cautery was used to deal with raw surface oozing.  A 15 Blake drain was placed and the incisions were closed with 3-0 and 4-0 vicryl and dermabons.  The drain was secured with 2-0 nylon   Disposition:  To PACU in stable condition.   Theotis Burrow, M.D. [facility] [place] Office: [phone] Pager:  [phone]

## 2015-02-08 ENCOUNTER — Encounter (HOSPITAL_COMMUNITY): Payer: Self-pay | Admitting: Surgery

## 2015-02-08 DIAGNOSIS — I129 Hypertensive chronic kidney disease with stage 1 through stage 4 chronic kidney disease, or unspecified chronic kidney disease: Secondary | ICD-10-CM | POA: Diagnosis not present

## 2015-02-08 LAB — CBC
HCT: 25.3 % — ABNORMAL LOW (ref 36.0–46.0)
Hemoglobin: 8.4 g/dL — ABNORMAL LOW (ref 12.0–15.0)
MCH: 24.5 pg — ABNORMAL LOW (ref 26.0–34.0)
MCHC: 33.2 g/dL (ref 30.0–36.0)
MCV: 73.8 fL — ABNORMAL LOW (ref 78.0–100.0)
Platelets: 107 10*3/uL — ABNORMAL LOW (ref 150–400)
RBC: 3.43 MIL/uL — ABNORMAL LOW (ref 3.87–5.11)
RDW: 15.8 % — ABNORMAL HIGH (ref 11.5–15.5)
WBC: 9 10*3/uL (ref 4.0–10.5)

## 2015-02-08 LAB — BASIC METABOLIC PANEL
Anion gap: 9 (ref 5–15)
BUN: 50 mg/dL — ABNORMAL HIGH (ref 6–20)
CO2: 22 mmol/L (ref 22–32)
Calcium: 8.9 mg/dL (ref 8.9–10.3)
Chloride: 107 mmol/L (ref 101–111)
Creatinine, Ser: 3.91 mg/dL — ABNORMAL HIGH (ref 0.44–1.00)
GFR calc Af Amer: 14 mL/min — ABNORMAL LOW (ref >60)
GFR calc non Af Amer: 12 mL/min — ABNORMAL LOW (ref >60)
Glucose, Bld: 108 mg/dL — ABNORMAL HIGH (ref 65–99)
Potassium: 4.2 mmol/L (ref 3.5–5.1)
Sodium: 138 mmol/L (ref 135–145)

## 2015-02-08 MED ORDER — ONDANSETRON 8 MG PO TBDP: 8.0000 mg | ORAL_TABLET | Freq: Three times a day (TID) | ORAL | Status: DC | PRN

## 2015-02-08 NOTE — Progress Notes (Addendum)
  [facility] Progress Note  Subjective  - POD #1  Had some nausea last night. Now resolved. Having some right arm soreness. Denies hand pain.   Objective Filed Vitals:   02/08/15 0515  BP: 132/70  Pulse: 87  Temp: 98.8 F (37.1 C)  Resp: 16    Intake/Output Summary (Last 24 hours) at 02/08/15 0740 Last data filed at 02/08/15 0551  Gross per 24 hour  Intake   1150 ml  Output   1630 ml  Net   -480 ml   Palpable thrill RUE fistula.  Right upper arm dressing with some sanguinous drainage. Incisions are dry. No active drainage. Upper arm is soft. No hematoma. Drain with 25 cc sanguinous output.  2+ right radial pulse. Sensation to right hand intact.   Assessment/Planning: 59 y.o. female is s/p: 2nd stage right  basilic vein transposition then back to OR for re-exploration of right arm fistula and evacuation of right arm hematoma  1 Day Post-Op   No obvious source of bleeding found yesterday. Source has likely clotted. Drain with 80 cc output yesterday and 25 cc this early this am. Will keep drain in for now. If output remains low, will d/c and discharge patient home. Patient is not yet on dialysis.   No DVT prophylaxis due to increased risk of bleeding.   Addendum Will d/c JP drain. Patient with two episodes of vomiting following clear liquids this am. With some nausea now.  Zofran IV x 1. Patient with chronic nausea prior to surgery.   D/c home this afternoon.   Alvia Grove 02/08/2015 7:40 AM --  Laboratory CBC    Component Value Date/Time   WBC 9.0 02/08/2015 0444   HGB 8.4* 02/08/2015 0444   HCT 25.3* 02/08/2015 0444   PLT 107* 02/08/2015 0444    BMET    Component Value Date/Time   NA 138 02/08/2015 0444   K 4.2 02/08/2015 0444   CL 107 02/08/2015 0444   CO2 22 02/08/2015 0444   GLUCOSE 108* 02/08/2015 0444   BUN 50* 02/08/2015 0444   CREATININE 3.91* 02/08/2015 0444   CREATININE 3.71* 10/04/2014 1016   CALCIUM 8.9 02/08/2015  0444   GFRNONAA 12* 02/08/2015 0444   GFRNONAA 13* 10/04/2014 1016   GFRAA 14* 02/08/2015 0444   GFRAA 15* 10/04/2014 1016    COAG No results found for: INR, PROTIME No results found for: PTT  Antibiotics Anti-infectives    Start     Dose/Rate Route Frequency Ordered Stop   02/07/15 1200  vancomycin (VANCOCIN) IVPB 1000 mg/200 mL premix     1,000 mg 200 mL/hr over 60 Minutes Intravenous To ShortStay Surgical 02/06/15 0947 02/07/15 1350   02/07/15 1018  vancomycin (VANCOCIN) 1 GM/200ML IVPB    Comments:  Forte, Lindsi   : cabinet override      02/07/15 1018 02/07/15 2229       Virgina Jock, PA-C [facility] Office: [phone] Pager: [phone] 02/08/2015 7:40 AM

## 2015-02-08 NOTE — Progress Notes (Signed)
Patient states that she is no longer feeling any nausea at this time.  Pain meds given, call light within reach.

## 2015-02-08 NOTE — Progress Notes (Signed)
Approx 300 ml clear red colored emesis after Pt ate 50% of liquid breakfast.  Assisted to clean Pt, change linens, emotional support given, no other needs expressed at this time.  Will con't plan of care.

## 2015-02-08 NOTE — Progress Notes (Signed)
Patient had nausea and vomiting after using the bathroom.  Vomiting clear liquid, Zofran given. Will continue to monitor, call light within reach.

## 2015-02-11 ENCOUNTER — Telehealth: Payer: Self-pay | Admitting: Surgery

## 2015-02-11 NOTE — Telephone Encounter (Signed)
-----   Message from [name], RN sent at 02/07/2015  3:49 PM EST ----- Regarding: [name] log; also needs 2 wk. f/u with Dr. Trula Slade    ----- Message -----    From: Alvia Grove, PA-C    Sent: 02/07/2015   3:23 PM      To: [facility] Charge Pool  S/p right 2nd stage BVT 02/07/15  F/u with Dr. Trula Slade in 2 weeks  Thanks Maudie Mercury

## 2015-02-11 NOTE — Telephone Encounter (Signed)
LM for pt re appt, dpm °

## 2015-02-11 NOTE — Discharge Summary (Signed)
[facility] Discharge Summary  Rachael George 07/02/1955 59 y.o. female  [id number]  Admission Date: 02/07/2015  Discharge Date: 02/08/2015  Physician: Harold Barban, MD  Admission Diagnosis: Stage IV Chronic Kidney Disease N18.4 RIGHT ARM HEMATOMA  HPI:   This is a 59 y.o. female who presented for follow-up status post right first stage basilic vein transposition on 12/06/2014. She has no symptoms of steal. She does have some numbness along the inner aspect of her forearm.  Hospital Course:  The patient was admitted to the hospital and taken to the operating room on 02/07/2015 and underwent: Right second stage basilic vein transposition.     The patient tolerated the procedure well and was transported to the PACU in good condition. In the recovery room, the patient developed a fullness in her right arm consistent with hematoma. She was taken back to the operating room for exploration.   There was no obvious source of bleeding detected. A 15 blake drain was placed and she was kept overnight for observation.   On POD 1, she had about 80 cc of drainage overnight and 25 cc of drainage that morning. Her drain was removed. She had some nausea overnight that persisted into the morning. Her nausea improved later that afternoon and she was discharged home in good condition.    CBC    Component Value Date/Time   WBC 9.0 02/08/2015 0444   RBC 3.43* 02/08/2015 0444   HGB 8.4* 02/08/2015 0444   HCT 25.3* 02/08/2015 0444   PLT 107* 02/08/2015 0444   MCV 73.8* 02/08/2015 0444   MCH 24.5* 02/08/2015 0444   MCHC 33.2 02/08/2015 0444   RDW 15.8* 02/08/2015 0444    BMET    Component Value Date/Time   NA 138 02/08/2015 0444   K 4.2 02/08/2015 0444   CL 107 02/08/2015 0444   CO2 22 02/08/2015 0444   GLUCOSE 108* 02/08/2015 0444   BUN 50* 02/08/2015 0444   CREATININE 3.91* 02/08/2015 0444   CREATININE 3.71* 10/04/2014 1016   CALCIUM 8.9 02/08/2015 0444   GFRNONAA 12* 02/08/2015 0444   GFRNONAA 13* 10/04/2014 1016   GFRAA 14* 02/08/2015 0444   GFRAA 15* 10/04/2014 1016     Discharge Instructions:   The patient is discharged to home with extensive instructions on wound care and progressive ambulation.  They are instructed not to drive or perform any heavy lifting until returning to see the physician in his office.  Discharge Instructions    Call MD for:  redness, tenderness, or signs of infection (pain, swelling, bleeding, redness, odor or green/yellow discharge around incision site)    Complete by:  As directed      Call MD for:  severe or increased pain, loss or decreased feeling  in affected limb(s)    Complete by:  As directed      Call MD for:  temperature >100.5    Complete by:  As directed      Discharge wound care:    Complete by:  As directed   Wash wounds daily with soap and water and pat dry.     Driving Restrictions    Complete by:  As directed   No driving while on pain medication     Increase activity slowly    Complete by:  As directed   Walk with assistance use walker or cane as needed     Lifting restrictions    Complete by:  As directed   No lifting for  1 week     Resume previous diet    Complete by:  As directed            Discharge Diagnosis:  Stage IV Chronic Kidney Disease N18.4 RIGHT ARM HEMATOMA  Secondary Diagnosis: Patient Active Problem List   Diagnosis Date Noted  . Hematoma 02/07/2015  . Acute gout due to renal impairment involving left foot 10/05/2014  . Anemia in chronic kidney disease 12/27/2013  . Essential hypertension, benign 12/07/2012  . Prediabetes 12/07/2012  . Chronic kidney disease 12/07/2012  . Low back pain 12/07/2012  . Right leg numbness 12/07/2012  . Asthma, mild intermittent 12/07/2012  . Hyperlipemia 12/07/2012  . Obesity, unspecified 12/07/2012   Past Medical History  Diagnosis Date  . Asthma   . GERD (gastroesophageal reflux disease)   . Hypertension   . High  cholesterol   . Kidney disease   . Kidney stones   . History of blood transfusion   . Heavy menstrual bleeding   . Prediabetes   . Hyperlipemia 12/07/2012  . Gout   . Anemia   . RA (rheumatoid arthritis) ([facility])        Medication List    TAKE these medications        albuterol 108 (90 BASE) MCG/ACT inhaler  Commonly known as:  PROAIR HFA  Inhale 2 puffs into the lungs every 6 (six) hours as needed for wheezing.     allopurinol 100 MG tablet  Commonly known as:  ZYLOPRIM  Take 100 mg by mouth 2 (two) times daily.     amLODipine 10 MG tablet  Commonly known as:  NORVASC  Take 1 tablet (10 mg total) by mouth daily.     atorvastatin 40 MG tablet  Commonly known as:  LIPITOR  Take 1 tablet (40 mg total) by mouth daily.     calcitRIOL 0.25 MCG capsule  Commonly known as:  ROCALTROL  Take 0.25 mcg by mouth daily.     carvedilol 25 MG tablet  Commonly known as:  COREG  TAKE 1 TABLET BY MOUTH 2 TIMES DAILY WITH A MEAL.     furosemide 40 MG tablet  Commonly known as:  LASIX  TAKE 1 TABLET BY MOUTH 2 TIMES DAILY.     minoxidil 2.5 MG tablet  Commonly known as:  LONITEN  Take 2.5 mg by mouth 2 (two) times daily.     ondansetron 8 MG disintegrating tablet  Commonly known as:  ZOFRAN ODT  Take 1 tablet (8 mg total) by mouth every 8 (eight) hours as needed for nausea or vomiting.     oxyCODONE-acetaminophen 5-325 MG tablet  Commonly known as:  ROXICET  Take 1-2 tablets by mouth every 4 (four) hours as needed for severe pain.        Percocet #30 No Refill  Disposition: Home  Patient's condition: is Good  Follow up: 1. Dr. Trula Slade in 2 weeks   Virgina Jock, PA-C [facility] [phone] 02/11/2015  3:32 PM

## 2015-02-20 ENCOUNTER — Encounter: Payer: Self-pay | Admitting: Surgery

## 2015-02-25 ENCOUNTER — Ambulatory Visit (INDEPENDENT_AMBULATORY_CARE_PROVIDER_SITE_OTHER): Payer: No Typology Code available for payment source | Admitting: Surgery

## 2015-02-25 ENCOUNTER — Encounter: Payer: Self-pay | Admitting: Surgery

## 2015-02-25 VITALS — BP 159/90 | HR 82 | Temp 98.2°F | Resp 16 | Ht 62.0 in | Wt 183.0 lb

## 2015-02-25 DIAGNOSIS — N184 Chronic kidney disease, stage 4 (severe): Secondary | ICD-10-CM

## 2015-02-25 NOTE — Progress Notes (Signed)
Patient name: Rachael George MRN: [id number] DOB: Jul 23, 1955 Sex: female     Chief Complaint  Patient presents with  . Follow-up    S/P Right BVT  02-07-15   C/O Right arm swollen, and hurts    HISTORY OF PRESENT ILLNESS: Patient is back for follow-up.  On 02/07/2015, she underwent elevation of a right basilic vein fistula and redo arterial anastomosis.  In the PACU, she developed severe swelling in her right arm and was taken back for exploration.  No definitive source of bleeding was encountered, however a significant amount of fresh blood was evacuated.  She has no real complaints of pain.  There is still numbness along the medial side of the arm which was present before surgery.  She has suffered from decreased energy and is having trouble getting back to work.  Past Medical History  Diagnosis Date  . Asthma   . GERD (gastroesophageal reflux disease)   . Hypertension   . High cholesterol   . Kidney disease   . Kidney stones   . History of blood transfusion   . Heavy menstrual bleeding   . Prediabetes   . Hyperlipemia 12/07/2012  . Gout   . Anemia   . RA (rheumatoid arthritis) [facility])     Past Surgical History  Procedure Laterality Date  . Abdominal hysterectomy  2000  . Left shoulder surgery  08/2005  . Tubal ligation  11/1986  . Ectopic pregnancy surgery  02/1982  . Bascilic vein transposition Right 12/06/2014    Procedure: FIRST STAGE BASILIC VEIN TRANSPOSITION - RIGHT;  Surgeon: Serafina Mitchell, MD;  Location: [place];  Service: Vascular;  Laterality: Right;  . Bascilic vein transposition Right 02/07/2015    Procedure: RIGHT ARM SECOND STAGE BASILIC VEIN TRANSPOSITION;  Surgeon: Serafina Mitchell, MD;  Location: [place];  Service: Vascular;  Laterality: Right;  . I&d extremity Right 02/07/2015    Procedure: IRRIGATION AND DEBRIDEMENT RIGHT ARM HEMATOMA;  Surgeon: Serafina Mitchell, MD;  Location: [facility] OR;  Service: Vascular;  Laterality: Right;    Social History   Social  History  . Marital Status: Married    Spouse Name: N/A  . Number of Children: 3  . Years of Education: N/A   Occupational History  . Not on file.   Social History Main Topics  . Smoking status: Never Smoker   . Smokeless tobacco: Never Used  . Alcohol Use: 0.0 oz/week    0 Standard drinks or equivalent per week     Comment: occ - 1 drink rarely  . Drug Use: No  . Sexual Activity: Not on file   Other Topics Concern  . 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 Hypertension Mother   . Breast cancer Maternal Aunt   . Deep vein thrombosis Daughter   . Hyperlipidemia Daughter   . Hypertension Daughter   . Prostate cancer Maternal Grandmother     Allergies as of 02/25/2015 - Review Complete 02/07/2015  Allergen Reaction Noted  . Atacand [candesartan] Anaphylaxis 12/07/2012  . Lisinopril Anaphylaxis 12/07/2012  . Motrin [  ibuprofen] Anaphylaxis 12/07/2012  . Nsaids Anaphylaxis 12/07/2012  . Tolmetin Anaphylaxis 02/04/2015  . Penicillins Rash 12/07/2012    Current Outpatient Prescriptions on File Prior to Visit  Medication Sig Dispense Refill  . albuterol (PROAIR HFA) 108 (90 BASE) MCG/ACT inhaler Inhale 2 puffs into the lungs every 6 (six) hours as needed for wheezing. 1 Inhaler 4  . allopurinol (ZYLOPRIM) 100 MG tablet Take 100 mg by mouth 2 (two) times daily.    Marland Kitchen amLODipine (NORVASC) 10 MG tablet Take 1 tablet (10 mg total) by mouth daily. 30 tablet 3  . atorvastatin (LIPITOR) 40 MG tablet Take 1 tablet (40 mg total) by mouth daily. 90 tablet 1  . calcitRIOL (ROCALTROL) 0.25 MCG capsule Take 0.25 mcg by mouth daily.  6  . carvedilol (COREG) 25 MG tablet TAKE 1 TABLET BY MOUTH 2 TIMES DAILY WITH A MEAL. 60  tablet 3  . furosemide (LASIX) 40 MG tablet TAKE 1 TABLET BY MOUTH 2 TIMES DAILY. (Patient taking differently: Take 80 mg by mouth 2 (two) times daily. TAKE 1 TABLET BY MOUTH 2 TIMES DAILY.) 60 tablet 3  . minoxidil (LONITEN) 2.5 MG tablet Take 2.5 mg by mouth 2 (two) times daily.     . ondansetron (ZOFRAN ODT) 8 MG disintegrating tablet Take 1 tablet (8 mg total) by mouth every 8 (eight) hours as needed for nausea or vomiting. 20 tablet 0  . oxyCODONE-acetaminophen (ROXICET) 5-325 MG tablet Take 1-2 tablets by mouth every 4 (four) hours as needed for severe pain. 30 tablet 0   No current facility-administered medications on file prior to visit.       PHYSICAL EXAMINATION:   Vital signs are There were no vitals filed for this visit. There is no weight on file to calculate BMI. General: The patient appears their stated age. There is an excellent thrill within her basilic vein transposition.  It is easily palpable.  No significant ecchymosis noted.  She continues to have a palpable radial pulse on the right.   Assessment: Chronic renal insufficiency Plan: The patient's access will be ready for use in one month.  She will follow up on an as-needed basis  V. Leia Alf, M.D. [facility] Office: [phone] Pager:  [phone]

## 2015-02-25 NOTE — Progress Notes (Signed)
Filed Vitals:   02/25/15 0912 02/25/15 0916  BP: 153/90 159/90  Pulse: 82 82  Temp:  98.2 F (36.8 C)  TempSrc:  Oral  Resp: 16   Height: 5\' 2"  (1.575 m)   Weight: 183 lb (83.008 kg)   SpO2: 100%

## 2015-03-05 ENCOUNTER — Telehealth: Payer: Self-pay | Admitting: Internal Medicine

## 2015-03-05 NOTE — Telephone Encounter (Signed)
Patient called and requested to speak with PCP in regards to some procedures. Please f/u with pt.

## 2015-03-06 ENCOUNTER — Other Ambulatory Visit: Payer: Self-pay | Admitting: Internal Medicine

## 2015-03-06 DIAGNOSIS — N184 Chronic kidney disease, stage 4 (severe): Secondary | ICD-10-CM

## 2015-03-06 NOTE — Telephone Encounter (Signed)
Nurse called patient, patient verified date of birth. Patient started going to transplant clinic in [facility]. They want some information as soon as possible to precede with transplant. Patient needs Tb skin test and colonoscopy. Transplant coordinator needs mammogram results.  Patient needs to go to dentist for oral assessment. Patient has coventry, do we know what dentist is covered?  Coventry ends at end of this year. Patient needs these test prior to end of year.  Nurse transferred patient to front office staff to schedule nurse appointment for Tb test visit next week.

## 2015-03-06 NOTE — Telephone Encounter (Signed)
Make sure she has signed a release of records so we can send mammogram results. I will place order for colonoscopy. She will need to look through her covered providers to see what dentist she can go to. I will still place dental order to see if Alinda Sierras can find a provider

## 2015-03-07 NOTE — Telephone Encounter (Signed)
Nurse called patient, patient verified date of birth. Patient aware of needing to sign release of records to have mammogram results sent. Patient aware of colonoscopy order being placed and dental referral. Patient explains she has already been contacted for dental and has appointment Monday.  Patient voices understanding and has no further questions at this time.

## 2015-03-13 ENCOUNTER — Ambulatory Visit: Payer: No Typology Code available for payment source | Attending: Internal Medicine

## 2015-03-13 VITALS — BP 138/82 | HR 86 | Temp 98.8°F | Resp 18 | Ht 62.0 in | Wt 186.0 lb

## 2015-03-13 DIAGNOSIS — Z0000 Encounter for general adult medical examination without abnormal findings: Secondary | ICD-10-CM | POA: Insufficient documentation

## 2015-03-13 NOTE — Progress Notes (Signed)
Patient here for Tb skin test. Patient needs Tb skin test to proceed with kidney transplant.  Patient reports feeling well.

## 2015-03-15 ENCOUNTER — Ambulatory Visit: Payer: No Typology Code available for payment source | Attending: Internal Medicine

## 2015-03-15 DIAGNOSIS — Z029 Encounter for administrative examinations, unspecified: Secondary | ICD-10-CM | POA: Diagnosis not present

## 2015-03-15 LAB — TB SKIN TEST
Induration: 0 mm
TB Skin Test: NEGATIVE

## 2015-03-28 ENCOUNTER — Other Ambulatory Visit: Payer: Self-pay | Admitting: *Deleted

## 2015-03-28 DIAGNOSIS — E785 Hyperlipidemia, unspecified: Secondary | ICD-10-CM

## 2015-03-28 MED ORDER — ATORVASTATIN CALCIUM 40 MG PO TABS: 40.0000 mg | ORAL_TABLET | Freq: Every day | ORAL | Status: DC

## 2015-03-29 ENCOUNTER — Other Ambulatory Visit (HOSPITAL_COMMUNITY): Payer: Self-pay | Admitting: *Deleted

## 2015-04-01 ENCOUNTER — Encounter (HOSPITAL_COMMUNITY)
Admission: RE | Admit: 2015-04-01 | Discharge: 2015-04-01 | Disposition: A | Discharge status: Home / Self Care | Payer: Medicaid Other | Source: Ambulatory Visit | Attending: Nephrology | Admitting: Nephrology

## 2015-04-01 DIAGNOSIS — D509 Iron deficiency anemia, unspecified: Secondary | ICD-10-CM | POA: Diagnosis not present

## 2015-04-01 MED ORDER — SODIUM CHLORIDE 0.9 % IV SOLN
510.0000 mg | INTRAVENOUS | Status: DC
  Administered 2015-04-01: 510 mg via INTRAVENOUS
  Filled 2015-04-01: qty 17

## 2015-04-08 ENCOUNTER — Encounter (HOSPITAL_COMMUNITY)
Admission: RE | Admit: 2015-04-08 | Discharge: 2015-04-08 | Disposition: A | Discharge status: Home / Self Care | Payer: Medicaid Other | Source: Ambulatory Visit | Attending: Nephrology | Admitting: Nephrology

## 2015-04-08 DIAGNOSIS — D509 Iron deficiency anemia, unspecified: Secondary | ICD-10-CM | POA: Diagnosis not present

## 2015-04-08 MED ORDER — SODIUM CHLORIDE 0.9 % IV SOLN
510.0000 mg | INTRAVENOUS | Status: DC
  Administered 2015-04-08: 510 mg via INTRAVENOUS
  Filled 2015-04-08: qty 17

## 2015-04-12 ENCOUNTER — Ambulatory Visit: Payer: Medicaid Other | Attending: Internal Medicine | Admitting: Internal Medicine

## 2015-04-12 ENCOUNTER — Encounter: Payer: Self-pay | Admitting: Internal Medicine

## 2015-04-12 VITALS — BP 145/92 | HR 79 | Temp 98.0°F | Resp 16 | Ht 62.0 in | Wt 182.8 lb

## 2015-04-12 DIAGNOSIS — I10 Essential (primary) hypertension: Secondary | ICD-10-CM | POA: Diagnosis not present

## 2015-04-12 DIAGNOSIS — M10372 Gout due to renal impairment, left ankle and foot: Secondary | ICD-10-CM | POA: Diagnosis not present

## 2015-04-12 DIAGNOSIS — N184 Chronic kidney disease, stage 4 (severe): Secondary | ICD-10-CM

## 2015-04-12 DIAGNOSIS — Z880 Allergy status to penicillin: Secondary | ICD-10-CM | POA: Diagnosis not present

## 2015-04-12 DIAGNOSIS — I129 Hypertensive chronic kidney disease with stage 1 through stage 4 chronic kidney disease, or unspecified chronic kidney disease: Secondary | ICD-10-CM | POA: Insufficient documentation

## 2015-04-12 DIAGNOSIS — R7303 Prediabetes: Secondary | ICD-10-CM | POA: Diagnosis not present

## 2015-04-12 DIAGNOSIS — M069 Rheumatoid arthritis, unspecified: Secondary | ICD-10-CM | POA: Diagnosis not present

## 2015-04-12 DIAGNOSIS — J45909 Unspecified asthma, uncomplicated: Secondary | ICD-10-CM | POA: Diagnosis not present

## 2015-04-12 DIAGNOSIS — Z87442 Personal history of urinary calculi: Secondary | ICD-10-CM | POA: Insufficient documentation

## 2015-04-12 DIAGNOSIS — E785 Hyperlipidemia, unspecified: Secondary | ICD-10-CM

## 2015-04-12 DIAGNOSIS — M109 Gout, unspecified: Secondary | ICD-10-CM | POA: Insufficient documentation

## 2015-04-12 DIAGNOSIS — Z888 Allergy status to other drugs, medicaments and biological substances status: Secondary | ICD-10-CM | POA: Diagnosis not present

## 2015-04-12 DIAGNOSIS — Z886 Allergy status to analgesic agent status: Secondary | ICD-10-CM | POA: Insufficient documentation

## 2015-04-12 DIAGNOSIS — Z0000 Encounter for general adult medical examination without abnormal findings: Secondary | ICD-10-CM

## 2015-04-12 DIAGNOSIS — Z79899 Other long term (current) drug therapy: Secondary | ICD-10-CM | POA: Insufficient documentation

## 2015-04-12 DIAGNOSIS — K219 Gastro-esophageal reflux disease without esophagitis: Secondary | ICD-10-CM | POA: Diagnosis not present

## 2015-04-12 LAB — POCT GLYCOSYLATED HEMOGLOBIN (HGB A1C): Hemoglobin A1C: 5.6

## 2015-04-12 LAB — LIPID PANEL
Cholesterol: 264 mg/dL — ABNORMAL HIGH (ref 125–200)
HDL: 33 mg/dL — ABNORMAL LOW (ref >=46)
LDL Cholesterol: 187 mg/dL — ABNORMAL HIGH (ref <130)
Total CHOL/HDL Ratio: 8 Ratio — ABNORMAL HIGH (ref <=5.0)
Triglycerides: 218 mg/dL — ABNORMAL HIGH (ref <150)
VLDL: 44 mg/dL — ABNORMAL HIGH (ref <30)

## 2015-04-12 LAB — URIC ACID: Uric Acid, Serum: 6.5 mg/dL (ref 2.4–7.0)

## 2015-04-12 MED ORDER — PREDNISONE 20 MG PO TABS: 20.0000 mg | ORAL_TABLET | Freq: Every day | ORAL | Status: DC

## 2015-04-12 NOTE — Progress Notes (Signed)
Patient here for three month follow up on gout and HTN

## 2015-04-12 NOTE — Progress Notes (Signed)
Patient ID: Rachael George, female   DOB: 1955-10-14, 60 y.o.   MRN: [id number]  CC: f/u  HPI: Rachael George is a 60 y.o. female here today for a follow up visit.  Patient has past medical history of HTN, CKD stage 4, gout, and HLD. Patient reports that she still has not begun dialysis but her fistula has matured and is ready for use if needed. She has not been checking her BP but states that every doctor's appointment her pressures have been normal.  Having a current gout flare in her left great toe. She takes her Allopurinol daily. Pain is stabbing. No erythema. She is also concerned about nausea and vomiting the whole month of December. She states her Nephrologist gave her some Protonix and Phenergan but she has yet to pick up the prescriptions. She is due for a colonoscopy next week with [facility] physicians. Has had 2 iron infusions this month.    Allergies  Allergen Reactions  . Atacand [Candesartan] Anaphylaxis  . Lisinopril Anaphylaxis  . Motrin [Ibuprofen] Anaphylaxis  . Nsaids Anaphylaxis  . Tolmetin Anaphylaxis  . Penicillins Rash    Has patient had a PCN reaction causing immediate rash, facial/tongue/throat swelling, SOB or lightheadedness with hypotension: No Has patient had a PCN reaction causing severe rash involving mucus membranes or skin necrosis: No Has patient had a PCN reaction that required hospitalization No Has patient had a PCN reaction occurring within the last 10 years: No If all of the above answers are "NO", then may proceed with Cephalosporin use.    Past Medical History  Diagnosis Date  . Asthma   . GERD (gastroesophageal reflux disease)   . Hypertension   . High cholesterol   . Kidney disease   . Kidney stones   . History of blood transfusion   . Heavy menstrual bleeding   . Prediabetes   . Hyperlipemia 12/07/2012  . Gout   . Anemia   . RA (rheumatoid arthritis) ([place])    Current Outpatient Prescriptions on File Prior to Visit  Medication Sig  Dispense Refill  . allopurinol (ZYLOPRIM) 100 MG tablet Take 100 mg by mouth 2 (two) times daily.    Marland Kitchen amLODipine (NORVASC) 10 MG tablet Take 1 tablet (10 mg total) by mouth daily. 30 tablet 3  . atorvastatin (LIPITOR) 40 MG tablet Take 1 tablet (40 mg total) by mouth daily. 90 tablet 1  . calcitRIOL (ROCALTROL) 0.25 MCG capsule Take 0.25 mcg by mouth daily.  6  . carvedilol (COREG) 25 MG tablet TAKE 1 TABLET BY MOUTH 2 TIMES DAILY WITH A MEAL. 60 tablet 3  . furosemide (LASIX) 40 MG tablet TAKE 1 TABLET BY MOUTH 2 TIMES DAILY. (Patient taking differently: Take 80 mg by mouth 2 (two) times daily. TAKE 1 TABLET BY MOUTH 2 TIMES DAILY.) 60 tablet 3  . minoxidil (LONITEN) 2.5 MG tablet Take 2.5 mg by mouth 2 (two) times daily.     Marland Kitchen albuterol (PROAIR HFA) 108 (90 BASE) MCG/ACT inhaler Inhale 2 puffs into the lungs every 6 (six) hours as needed for wheezing. 1 Inhaler 4  . ondansetron (ZOFRAN ODT) 8 MG disintegrating tablet Take 1 tablet (8 mg total) by mouth every 8 (eight) hours as needed for nausea or vomiting. 20 tablet 0  . oxyCODONE-acetaminophen (ROXICET) 5-325 MG tablet Take 1-2 tablets by mouth every 4 (four) hours as needed for severe pain. 30 tablet 0   No current facility-administered medications on file prior to visit.   Family  History  Problem Relation Age of Onset  . Hypertension Mother   . Breast cancer Maternal Aunt   . Deep vein thrombosis Daughter   . Hyperlipidemia Daughter   . Hypertension Daughter   . Prostate cancer Maternal Grandmother    Social History   Social History  . Marital Status: Married    Spouse Name: N/A  . Number of Children: 3  . Years of Education: N/A   Occupational History  . Not on file.   Social History Main Topics  . Smoking status: Never Smoker   . Smokeless tobacco: Never Used  . Alcohol Use: 0.0 oz/week    0 Standard drinks or equivalent per week     Comment: occ - 1 drink rarely  . Drug Use: No  . Sexual Activity: Not on file    Other Topics Concern  . 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   Objective:   Filed Vitals:   04/12/15 0911  BP: 145/92  Pulse: 79  Temp: 98 F (36.7 C)  Resp: 16    Physical Exam  Constitutional: She is oriented to person, place, and time.  Neck:  No bruits  Cardiovascular: Normal rate, regular rhythm and normal heart sounds.   R upper arm fistula. Thrill felt  Pulmonary/Chest: Effort normal and breath sounds normal.  Neurological: She is alert and oriented to person, place, and time.  Skin: Skin is warm and dry.     Lab Results  Component Value Date   WBC 9.0 02/08/2015   HGB 8.4* 02/08/2015   HCT 25.3* 02/08/2015   MCV 73.8* 02/08/2015   PLT 107* 02/08/2015   Lab Results  Component Value Date   CREATININE 3.91* 02/08/2015   BUN 50* 02/08/2015   NA 138 02/08/2015   K 4.2 02/08/2015   CL 107 02/08/2015   CO2 22 02/08/2015    Lab Results  Component Value Date   HGBA1C 5.60 04/12/2015   Lipid Panel     Component Value Date/Time   CHOL 215* 10/04/2014 1016   TRIG 170* 10/04/2014 1016   HDL 38* 10/04/2014 1016   CHOLHDL 5.7 10/04/2014 1016   VLDL 34 10/04/2014 1016   LDLCALC 143* 10/04/2014 1016       Assessment and plan:   Rachael George was seen today for follow-up.  Diagnoses and all orders for this visit:  Essential hypertension, benign Continue current regimen. Will not make changes today. Will defer to Nephrology if needed.  Chronic kidney disease, stage 4 (severe) (HCC) BP control and continue follow up with Nephrology  Acute gout due to renal impairment involving left foot -     predniSONE (DELTASONE) 20 MG tablet; Take 1 tablet (20  mg total) by mouth daily with breakfast. -     Uric Acid She will continue Allopurinol but will use prednisone to help with inflammation. Unable to use colchicine due to kidney function  HLD (hyperlipidemia) -     Lipid panel Will recheck level today. Last LDL was 6 months ago and was not at goal.  Health care  maintenance -     HgB A1c   Return in about 6 months (around 10/10/2015).       Lance Bosch, [facility] [phone] 04/12/2015, 9:18 AM

## 2015-04-12 NOTE — Patient Instructions (Signed)
I have sent a short course of prednisone to take daily for 6 days to see if that will help the gout flare

## 2015-04-16 ENCOUNTER — Other Ambulatory Visit: Payer: Self-pay | Admitting: Gastroenterology

## 2015-04-17 ENCOUNTER — Telehealth: Payer: Self-pay

## 2015-04-17 MED ORDER — ATORVASTATIN CALCIUM 80 MG PO TABS: 80.0000 mg | ORAL_TABLET | Freq: Every day | ORAL | Status: DC

## 2015-04-17 NOTE — Telephone Encounter (Signed)
Called patient and verified date of birth Patient is aware of her cholesterol being too high A new RX for atorvastatin 80mg  sent to [facility] pharmacy

## 2015-04-17 NOTE — Telephone Encounter (Signed)
-----   Message from Lance Bosch, NP sent at 04/15/2015  7:26 PM EST ----- Has patient been on higher dose of Atorvastatin. She needs to increase to 80 mg daily. Her cholesterol is way too elevated

## 2015-04-30 ENCOUNTER — Other Ambulatory Visit: Payer: Self-pay | Admitting: Internal Medicine

## 2015-04-30 MED ORDER — ALBUTEROL SULFATE HFA 108 (90 BASE) MCG/ACT IN AERS: 2.0000 | INHALATION_SPRAY | Freq: Four times a day (QID) | RESPIRATORY_TRACT | Status: DC | PRN

## 2015-05-03 DIAGNOSIS — Z0279 Encounter for issue of other medical certificate: Secondary | ICD-10-CM | POA: Diagnosis not present

## 2015-06-03 ENCOUNTER — Other Ambulatory Visit: Payer: Self-pay | Admitting: Internal Medicine

## 2015-07-19 ENCOUNTER — Other Ambulatory Visit: Payer: Self-pay

## 2015-07-19 DIAGNOSIS — Z1231 Encounter for screening mammogram for malignant neoplasm of breast: Secondary | ICD-10-CM

## 2015-08-01 ENCOUNTER — Ambulatory Visit: Payer: PRIVATE HEALTH INSURANCE

## 2015-08-23 DIAGNOSIS — D509 Iron deficiency anemia, unspecified: Secondary | ICD-10-CM | POA: Diagnosis not present

## 2015-08-23 DIAGNOSIS — N186 End stage renal disease: Secondary | ICD-10-CM | POA: Diagnosis not present

## 2015-08-23 DIAGNOSIS — N2581 Secondary hyperparathyroidism of renal origin: Secondary | ICD-10-CM | POA: Diagnosis not present

## 2015-08-23 DIAGNOSIS — E1129 Type 2 diabetes mellitus with other diabetic kidney complication: Secondary | ICD-10-CM | POA: Diagnosis not present

## 2015-08-26 DIAGNOSIS — D509 Iron deficiency anemia, unspecified: Secondary | ICD-10-CM | POA: Diagnosis not present

## 2015-08-26 DIAGNOSIS — N186 End stage renal disease: Secondary | ICD-10-CM | POA: Diagnosis not present

## 2015-08-26 DIAGNOSIS — N2581 Secondary hyperparathyroidism of renal origin: Secondary | ICD-10-CM | POA: Diagnosis not present

## 2015-08-26 DIAGNOSIS — E1129 Type 2 diabetes mellitus with other diabetic kidney complication: Secondary | ICD-10-CM | POA: Diagnosis not present

## 2015-08-28 DIAGNOSIS — N186 End stage renal disease: Secondary | ICD-10-CM | POA: Diagnosis not present

## 2015-08-28 DIAGNOSIS — E1129 Type 2 diabetes mellitus with other diabetic kidney complication: Secondary | ICD-10-CM | POA: Diagnosis not present

## 2015-08-28 DIAGNOSIS — N2581 Secondary hyperparathyroidism of renal origin: Secondary | ICD-10-CM | POA: Diagnosis not present

## 2015-08-28 DIAGNOSIS — D509 Iron deficiency anemia, unspecified: Secondary | ICD-10-CM | POA: Diagnosis not present

## 2015-08-30 DIAGNOSIS — E1129 Type 2 diabetes mellitus with other diabetic kidney complication: Secondary | ICD-10-CM | POA: Diagnosis not present

## 2015-08-30 DIAGNOSIS — D509 Iron deficiency anemia, unspecified: Secondary | ICD-10-CM | POA: Diagnosis not present

## 2015-08-30 DIAGNOSIS — N186 End stage renal disease: Secondary | ICD-10-CM | POA: Diagnosis not present

## 2015-08-30 DIAGNOSIS — N2581 Secondary hyperparathyroidism of renal origin: Secondary | ICD-10-CM | POA: Diagnosis not present

## 2015-09-02 ENCOUNTER — Encounter: Payer: Self-pay | Admitting: Family Medicine

## 2015-09-02 ENCOUNTER — Ambulatory Visit: Payer: Medicaid Other | Attending: Family Medicine | Admitting: Family Medicine

## 2015-09-02 VITALS — BP 142/87 | HR 93 | Temp 98.3°F | Ht 62.0 in | Wt 177.6 lb

## 2015-09-02 DIAGNOSIS — N2581 Secondary hyperparathyroidism of renal origin: Secondary | ICD-10-CM | POA: Diagnosis not present

## 2015-09-02 DIAGNOSIS — D509 Iron deficiency anemia, unspecified: Secondary | ICD-10-CM | POA: Diagnosis not present

## 2015-09-02 DIAGNOSIS — R7309 Other abnormal glucose: Secondary | ICD-10-CM | POA: Diagnosis not present

## 2015-09-02 DIAGNOSIS — I10 Essential (primary) hypertension: Secondary | ICD-10-CM

## 2015-09-02 DIAGNOSIS — E785 Hyperlipidemia, unspecified: Secondary | ICD-10-CM

## 2015-09-02 DIAGNOSIS — N186 End stage renal disease: Secondary | ICD-10-CM

## 2015-09-02 DIAGNOSIS — J4520 Mild intermittent asthma, uncomplicated: Secondary | ICD-10-CM

## 2015-09-02 DIAGNOSIS — M1A09X0 Idiopathic chronic gout, multiple sites, without tophus (tophi): Secondary | ICD-10-CM

## 2015-09-02 DIAGNOSIS — H538 Other visual disturbances: Secondary | ICD-10-CM

## 2015-09-02 DIAGNOSIS — R7303 Prediabetes: Secondary | ICD-10-CM | POA: Diagnosis not present

## 2015-09-02 DIAGNOSIS — E1129 Type 2 diabetes mellitus with other diabetic kidney complication: Secondary | ICD-10-CM | POA: Diagnosis not present

## 2015-09-02 LAB — HEMOGLOBIN A1C
Hgb A1c MFr Bld: 5.4 % (ref <5.7)
Mean Plasma Glucose: 108 mg/dL

## 2015-09-02 LAB — LIPID PANEL
Cholesterol: 208 mg/dL — ABNORMAL HIGH (ref 125–200)
HDL: 45 mg/dL — ABNORMAL LOW (ref >=46)
LDL Cholesterol: 103 mg/dL (ref <130)
Total CHOL/HDL Ratio: 4.6 Ratio (ref <=5.0)
Triglycerides: 299 mg/dL — ABNORMAL HIGH (ref <150)
VLDL: 60 mg/dL — ABNORMAL HIGH (ref <30)

## 2015-09-02 MED ORDER — COLCHICINE 0.6 MG PO TABS: ORAL_TABLET | ORAL | Status: DC

## 2015-09-02 MED ORDER — ATORVASTATIN CALCIUM 80 MG PO TABS: 80.0000 mg | ORAL_TABLET | Freq: Every day | ORAL | Status: DC

## 2015-09-02 NOTE — Patient Instructions (Signed)

## 2015-09-02 NOTE — Progress Notes (Signed)
Subjective:  Patient ID: Rachael George, female    DOB: 07/03/1955  Age: 60 y.o. MRN: [id number]  CC: CPE   HPI AMARILIS FEES presents for To establish care. Medical history significant for hypertension, hyperlipidemia, end-stage renal disease (on hemodialysis Mondays, Wednesdays and Fridays, prediabetes who comes in to establish care with me and was previously followed by the nurse practitioner who is no longer with the practice.  She was previously on antihypertensives which have been discontinued by her nephrologist and she informs me she is on the transplant list at this time. Complains of occasional floaters in both eyes and would like to be referred to an Ophthalmologist.  Has Gout Flares in Her Left Big Toe about 2-3 flares per Months but Denies Any Flare at This Time; She Has a 2/10 Residual Pain in Her Left Toe and Remains on Allopurinol but Admits to Eating A Lot Of Red Meat in an Attempt to Increase Her Protein intake.  She Also Has a Handicap Form with her that She Would like Completed as she suffers from left knee pain and has to use a cane; takes extra strength Tylenol for this pain.  Outpatient Prescriptions Prior to Visit  Medication Sig Dispense Refill  . albuterol (PROAIR HFA) 108 (90 Base) MCG/ACT inhaler Inhale 2 puffs into the lungs every 6 (six) hours as needed for wheezing. 1 Inhaler 4  . allopurinol (ZYLOPRIM) 100 MG tablet Take 100 mg by mouth 2 (two) times daily. Reported on 09/02/2015    . atorvastatin (LIPITOR) 80 MG tablet Take 1 tablet (80 mg total) by mouth daily. 90 tablet 3  . minoxidil (LONITEN) 2.5 MG tablet Take 2.5 mg by mouth 2 (two) times daily. Reported on 09/02/2015    . oxyCODONE-acetaminophen (ROXICET) 5-325 MG tablet Take 1-2 tablets by mouth every 4 (four) hours as needed for severe pain. 30 tablet 0  . amLODipine (NORVASC) 10 MG tablet TAKE ONE (1) TABLET BY MOUTH EVERY DAY (Patient not taking: Reported on 09/02/2015) 30 tablet 2  . calcitRIOL  (ROCALTROL) 0.25 MCG capsule Take 0.25 mcg by mouth daily. Reported on 09/02/2015  6  . carvedilol (COREG) 25 MG tablet TAKE 1 TABLET BY MOUTH 2 TIMES DAILY WITH A MEAL. (Patient not taking: Reported on 09/02/2015) 60 tablet 3  . furosemide (LASIX) 40 MG tablet TAKE 1 TABLET BY MOUTH 2 TIMES DAILY. (Patient not taking: Reported on 09/02/2015) 60 tablet 3  . ondansetron (ZOFRAN ODT) 8 MG disintegrating tablet Take 1 tablet (8 mg total) by mouth every 8 (eight) hours as needed for nausea or vomiting. 20 tablet 0  . predniSONE (DELTASONE) 20 MG tablet Take 1 tablet (20 mg total) by mouth daily with breakfast. 6 tablet 0   No facility-administered medications prior to visit.    ROS Review of Systems  Constitutional: Negative for activity change, appetite change and fatigue.  HENT: Negative for congestion, sinus pressure and sore throat.   Eyes: Negative for visual disturbance.  Respiratory: Negative for cough, chest tightness, shortness of breath and wheezing.   Cardiovascular: Negative for chest pain and palpitations.  Gastrointestinal: Negative for abdominal pain, constipation and abdominal distention.  Endocrine: Negative for polydipsia.  Genitourinary: Negative for dysuria and frequency.  Musculoskeletal: Negative for back pain and arthralgias.       Occasional left knee pain  Skin: Negative for rash.  Neurological: Negative for tremors, light-headedness and numbness.  Hematological: Does not bruise/bleed easily.  Psychiatric/Behavioral: Negative for behavioral problems and agitation.  Objective:  BP 142/87 mmHg  Pulse 93  Temp(Src) 98.3 F (36.8 C)  Ht 5\' 2"  (1.575 m)  Wt 177 lb 9.6 oz (80.559 kg)  BMI 32.48 kg/m2  SpO2 100%  BP/Weight 09/02/2015 04/12/2015 [id number]  Systolic BP [id number]  Diastolic BP 87 92 80  Wt. (Lbs) 177.6 182.8 -  BMI 32.48 33.43 -      Physical Exam  Constitutional: She is oriented to person, place, and time. She appears well-developed and  well-nourished.  Cardiovascular: Normal rate, normal heart sounds and intact distal pulses.   No murmur heard. Pulmonary/Chest: Effort normal and breath sounds normal. She has no wheezes. She has no rales. She exhibits no tenderness.  Abdominal: Soft. Bowel sounds are normal. She exhibits no distension and no mass. There is no tenderness.  Musculoskeletal: She exhibits tenderness (Mild tenderness in left knee on range of motion).  Right arm AV fistula  Neurological: She is alert and oriented to person, place, and time.  Gait: Ambulates with the aid of a cane     Assessment & Plan:   1. Prediabetes Controlled with A1c of 5.4 from 06/2015-labs from dialysis Diet-controlled - Hemoglobin A1c  2. Essential hypertension, benign Controlled Antihypertensives were discontinued by nephrology  3. Asthma, mild intermittent, uncomplicated Stable  4. Hyperlipemia - Lipid Panel - atorvastatin (LIPITOR) 80 MG tablet; Take 1 tablet (80 mg total) by mouth daily.  Dispense: 90 tablet; Refill: 1  5. End stage renal disease ([place]) Continue hemodialysis as per protocol  6. Idiopathic chronic gout of multiple sites without tophus No acute flare is Discuss dietary modifications to prevent flares. - colchicine 0.6 MG tablet; Take 2 tabs (1.2mg ) at the onset of a gout flare, may repeat 1 tab (0.6mg ) if symptoms persist  Dispense: 30 tablet; Refill: 2  7.Blurry vision/floaters -Referred to ophthalmology   Completed form for handicap placard.  Meds ordered this encounter  Medications  . colchicine 0.6 MG tablet    Sig: Take 2 tabs (1.2mg ) at the onset of a gout flare, may repeat 1 tab (0.6mg ) if symptoms persist    Dispense:  30 tablet    Refill:  2  . atorvastatin (LIPITOR) 80 MG tablet    Sig: Take 1 tablet (80 mg total) by mouth daily.    Dispense:  90 tablet    Refill:  1    Follow-up: Return in about 6 months (around 03/03/2016) for follow up opn hypertension.   Arnoldo Morale  MD

## 2015-09-03 DIAGNOSIS — E119 Type 2 diabetes mellitus without complications: Secondary | ICD-10-CM | POA: Diagnosis not present

## 2015-09-03 DIAGNOSIS — H3562 Retinal hemorrhage, left eye: Secondary | ICD-10-CM | POA: Diagnosis not present

## 2015-09-03 DIAGNOSIS — H31092 Other chorioretinal scars, left eye: Secondary | ICD-10-CM | POA: Diagnosis not present

## 2015-09-03 DIAGNOSIS — H35033 Hypertensive retinopathy, bilateral: Secondary | ICD-10-CM | POA: Diagnosis not present

## 2015-09-03 LAB — HM DIABETES EYE EXAM

## 2015-09-04 DIAGNOSIS — N2581 Secondary hyperparathyroidism of renal origin: Secondary | ICD-10-CM | POA: Diagnosis not present

## 2015-09-04 DIAGNOSIS — N186 End stage renal disease: Secondary | ICD-10-CM | POA: Diagnosis not present

## 2015-09-04 DIAGNOSIS — D509 Iron deficiency anemia, unspecified: Secondary | ICD-10-CM | POA: Diagnosis not present

## 2015-09-04 DIAGNOSIS — E1129 Type 2 diabetes mellitus with other diabetic kidney complication: Secondary | ICD-10-CM | POA: Diagnosis not present

## 2015-09-06 DIAGNOSIS — N2581 Secondary hyperparathyroidism of renal origin: Secondary | ICD-10-CM | POA: Diagnosis not present

## 2015-09-06 DIAGNOSIS — D509 Iron deficiency anemia, unspecified: Secondary | ICD-10-CM | POA: Diagnosis not present

## 2015-09-06 DIAGNOSIS — E1129 Type 2 diabetes mellitus with other diabetic kidney complication: Secondary | ICD-10-CM | POA: Diagnosis not present

## 2015-09-06 DIAGNOSIS — N186 End stage renal disease: Secondary | ICD-10-CM | POA: Diagnosis not present

## 2015-09-09 DIAGNOSIS — N186 End stage renal disease: Secondary | ICD-10-CM | POA: Diagnosis not present

## 2015-09-09 DIAGNOSIS — D509 Iron deficiency anemia, unspecified: Secondary | ICD-10-CM | POA: Diagnosis not present

## 2015-09-09 DIAGNOSIS — E1129 Type 2 diabetes mellitus with other diabetic kidney complication: Secondary | ICD-10-CM | POA: Diagnosis not present

## 2015-09-09 DIAGNOSIS — N2581 Secondary hyperparathyroidism of renal origin: Secondary | ICD-10-CM | POA: Diagnosis not present

## 2015-09-11 DIAGNOSIS — E1129 Type 2 diabetes mellitus with other diabetic kidney complication: Secondary | ICD-10-CM | POA: Diagnosis not present

## 2015-09-11 DIAGNOSIS — N2581 Secondary hyperparathyroidism of renal origin: Secondary | ICD-10-CM | POA: Diagnosis not present

## 2015-09-11 DIAGNOSIS — N186 End stage renal disease: Secondary | ICD-10-CM | POA: Diagnosis not present

## 2015-09-11 DIAGNOSIS — D509 Iron deficiency anemia, unspecified: Secondary | ICD-10-CM | POA: Diagnosis not present

## 2015-09-13 DIAGNOSIS — D509 Iron deficiency anemia, unspecified: Secondary | ICD-10-CM | POA: Diagnosis not present

## 2015-09-13 DIAGNOSIS — E1129 Type 2 diabetes mellitus with other diabetic kidney complication: Secondary | ICD-10-CM | POA: Diagnosis not present

## 2015-09-13 DIAGNOSIS — N186 End stage renal disease: Secondary | ICD-10-CM | POA: Diagnosis not present

## 2015-09-13 DIAGNOSIS — N2581 Secondary hyperparathyroidism of renal origin: Secondary | ICD-10-CM | POA: Diagnosis not present

## 2015-09-16 DIAGNOSIS — D509 Iron deficiency anemia, unspecified: Secondary | ICD-10-CM | POA: Diagnosis not present

## 2015-09-16 DIAGNOSIS — N2581 Secondary hyperparathyroidism of renal origin: Secondary | ICD-10-CM | POA: Diagnosis not present

## 2015-09-16 DIAGNOSIS — N186 End stage renal disease: Secondary | ICD-10-CM | POA: Diagnosis not present

## 2015-09-16 DIAGNOSIS — E1129 Type 2 diabetes mellitus with other diabetic kidney complication: Secondary | ICD-10-CM | POA: Diagnosis not present

## 2015-09-18 DIAGNOSIS — D509 Iron deficiency anemia, unspecified: Secondary | ICD-10-CM | POA: Diagnosis not present

## 2015-09-18 DIAGNOSIS — E1129 Type 2 diabetes mellitus with other diabetic kidney complication: Secondary | ICD-10-CM | POA: Diagnosis not present

## 2015-09-18 DIAGNOSIS — N186 End stage renal disease: Secondary | ICD-10-CM | POA: Diagnosis not present

## 2015-09-18 DIAGNOSIS — N2581 Secondary hyperparathyroidism of renal origin: Secondary | ICD-10-CM | POA: Diagnosis not present

## 2015-09-20 DIAGNOSIS — N186 End stage renal disease: Secondary | ICD-10-CM | POA: Diagnosis not present

## 2015-09-20 DIAGNOSIS — I129 Hypertensive chronic kidney disease with stage 1 through stage 4 chronic kidney disease, or unspecified chronic kidney disease: Secondary | ICD-10-CM | POA: Diagnosis not present

## 2015-09-20 DIAGNOSIS — Z992 Dependence on renal dialysis: Secondary | ICD-10-CM | POA: Diagnosis not present

## 2015-09-20 DIAGNOSIS — E1129 Type 2 diabetes mellitus with other diabetic kidney complication: Secondary | ICD-10-CM | POA: Diagnosis not present

## 2015-09-20 DIAGNOSIS — N2581 Secondary hyperparathyroidism of renal origin: Secondary | ICD-10-CM | POA: Diagnosis not present

## 2015-09-20 DIAGNOSIS — D509 Iron deficiency anemia, unspecified: Secondary | ICD-10-CM | POA: Diagnosis not present

## 2015-09-23 DIAGNOSIS — E1129 Type 2 diabetes mellitus with other diabetic kidney complication: Secondary | ICD-10-CM | POA: Diagnosis not present

## 2015-09-23 DIAGNOSIS — N2581 Secondary hyperparathyroidism of renal origin: Secondary | ICD-10-CM | POA: Diagnosis not present

## 2015-09-23 DIAGNOSIS — N186 End stage renal disease: Secondary | ICD-10-CM | POA: Diagnosis not present

## 2015-09-23 DIAGNOSIS — D509 Iron deficiency anemia, unspecified: Secondary | ICD-10-CM | POA: Diagnosis not present

## 2015-09-25 DIAGNOSIS — N186 End stage renal disease: Secondary | ICD-10-CM | POA: Diagnosis not present

## 2015-09-25 DIAGNOSIS — E1129 Type 2 diabetes mellitus with other diabetic kidney complication: Secondary | ICD-10-CM | POA: Diagnosis not present

## 2015-09-25 DIAGNOSIS — N2581 Secondary hyperparathyroidism of renal origin: Secondary | ICD-10-CM | POA: Diagnosis not present

## 2015-09-25 DIAGNOSIS — D509 Iron deficiency anemia, unspecified: Secondary | ICD-10-CM | POA: Diagnosis not present

## 2015-09-27 DIAGNOSIS — E1129 Type 2 diabetes mellitus with other diabetic kidney complication: Secondary | ICD-10-CM | POA: Diagnosis not present

## 2015-09-27 DIAGNOSIS — D509 Iron deficiency anemia, unspecified: Secondary | ICD-10-CM | POA: Diagnosis not present

## 2015-09-27 DIAGNOSIS — N2581 Secondary hyperparathyroidism of renal origin: Secondary | ICD-10-CM | POA: Diagnosis not present

## 2015-09-27 DIAGNOSIS — N186 End stage renal disease: Secondary | ICD-10-CM | POA: Diagnosis not present

## 2015-09-30 DIAGNOSIS — N2581 Secondary hyperparathyroidism of renal origin: Secondary | ICD-10-CM | POA: Diagnosis not present

## 2015-09-30 DIAGNOSIS — D509 Iron deficiency anemia, unspecified: Secondary | ICD-10-CM | POA: Diagnosis not present

## 2015-09-30 DIAGNOSIS — E1129 Type 2 diabetes mellitus with other diabetic kidney complication: Secondary | ICD-10-CM | POA: Diagnosis not present

## 2015-09-30 DIAGNOSIS — N186 End stage renal disease: Secondary | ICD-10-CM | POA: Diagnosis not present

## 2015-10-02 DIAGNOSIS — D509 Iron deficiency anemia, unspecified: Secondary | ICD-10-CM | POA: Diagnosis not present

## 2015-10-02 DIAGNOSIS — N186 End stage renal disease: Secondary | ICD-10-CM | POA: Diagnosis not present

## 2015-10-02 DIAGNOSIS — E1129 Type 2 diabetes mellitus with other diabetic kidney complication: Secondary | ICD-10-CM | POA: Diagnosis not present

## 2015-10-02 DIAGNOSIS — N2581 Secondary hyperparathyroidism of renal origin: Secondary | ICD-10-CM | POA: Diagnosis not present

## 2015-10-04 DIAGNOSIS — N2581 Secondary hyperparathyroidism of renal origin: Secondary | ICD-10-CM | POA: Diagnosis not present

## 2015-10-04 DIAGNOSIS — N186 End stage renal disease: Secondary | ICD-10-CM | POA: Diagnosis not present

## 2015-10-04 DIAGNOSIS — E1129 Type 2 diabetes mellitus with other diabetic kidney complication: Secondary | ICD-10-CM | POA: Diagnosis not present

## 2015-10-04 DIAGNOSIS — D509 Iron deficiency anemia, unspecified: Secondary | ICD-10-CM | POA: Diagnosis not present

## 2015-10-07 ENCOUNTER — Telehealth: Payer: Self-pay | Admitting: Family Medicine

## 2015-10-07 DIAGNOSIS — N2581 Secondary hyperparathyroidism of renal origin: Secondary | ICD-10-CM | POA: Diagnosis not present

## 2015-10-07 DIAGNOSIS — D509 Iron deficiency anemia, unspecified: Secondary | ICD-10-CM | POA: Diagnosis not present

## 2015-10-07 DIAGNOSIS — E1129 Type 2 diabetes mellitus with other diabetic kidney complication: Secondary | ICD-10-CM | POA: Diagnosis not present

## 2015-10-07 DIAGNOSIS — N186 End stage renal disease: Secondary | ICD-10-CM | POA: Diagnosis not present

## 2015-10-07 NOTE — Telephone Encounter (Signed)
RN advised patient:  Your cholesterol has improved significantly. Continue atorvastatin and low-cholesterol diet. Patient verbalized understanding.  Priscille Heidelberg, RN, BSN

## 2015-10-07 NOTE — Telephone Encounter (Signed)
Pt. Called requesting her cholesterol results. Please f/u with pt.

## 2015-10-09 DIAGNOSIS — N2581 Secondary hyperparathyroidism of renal origin: Secondary | ICD-10-CM | POA: Diagnosis not present

## 2015-10-09 DIAGNOSIS — N186 End stage renal disease: Secondary | ICD-10-CM | POA: Diagnosis not present

## 2015-10-09 DIAGNOSIS — D509 Iron deficiency anemia, unspecified: Secondary | ICD-10-CM | POA: Diagnosis not present

## 2015-10-09 DIAGNOSIS — E1129 Type 2 diabetes mellitus with other diabetic kidney complication: Secondary | ICD-10-CM | POA: Diagnosis not present

## 2015-10-11 DIAGNOSIS — N186 End stage renal disease: Secondary | ICD-10-CM | POA: Diagnosis not present

## 2015-10-11 DIAGNOSIS — E1129 Type 2 diabetes mellitus with other diabetic kidney complication: Secondary | ICD-10-CM | POA: Diagnosis not present

## 2015-10-11 DIAGNOSIS — N2581 Secondary hyperparathyroidism of renal origin: Secondary | ICD-10-CM | POA: Diagnosis not present

## 2015-10-11 DIAGNOSIS — D509 Iron deficiency anemia, unspecified: Secondary | ICD-10-CM | POA: Diagnosis not present

## 2015-10-14 DIAGNOSIS — N186 End stage renal disease: Secondary | ICD-10-CM | POA: Diagnosis not present

## 2015-10-14 DIAGNOSIS — E1129 Type 2 diabetes mellitus with other diabetic kidney complication: Secondary | ICD-10-CM | POA: Diagnosis not present

## 2015-10-14 DIAGNOSIS — N2581 Secondary hyperparathyroidism of renal origin: Secondary | ICD-10-CM | POA: Diagnosis not present

## 2015-10-14 DIAGNOSIS — D509 Iron deficiency anemia, unspecified: Secondary | ICD-10-CM | POA: Diagnosis not present

## 2015-10-15 DIAGNOSIS — K219 Gastro-esophageal reflux disease without esophagitis: Secondary | ICD-10-CM | POA: Diagnosis not present

## 2015-10-15 DIAGNOSIS — G4700 Insomnia, unspecified: Secondary | ICD-10-CM | POA: Diagnosis not present

## 2015-10-15 DIAGNOSIS — E7800 Pure hypercholesterolemia, unspecified: Secondary | ICD-10-CM | POA: Diagnosis not present

## 2015-10-15 DIAGNOSIS — Z992 Dependence on renal dialysis: Secondary | ICD-10-CM | POA: Insufficient documentation

## 2015-10-15 DIAGNOSIS — N186 End stage renal disease: Secondary | ICD-10-CM | POA: Diagnosis not present

## 2015-10-15 DIAGNOSIS — H547 Unspecified visual loss: Secondary | ICD-10-CM | POA: Insufficient documentation

## 2015-10-15 DIAGNOSIS — G478 Other sleep disorders: Secondary | ICD-10-CM | POA: Diagnosis not present

## 2015-10-15 DIAGNOSIS — Z9989 Dependence on other enabling machines and devices: Secondary | ICD-10-CM | POA: Diagnosis not present

## 2015-10-15 DIAGNOSIS — Z01818 Encounter for other preprocedural examination: Secondary | ICD-10-CM | POA: Diagnosis not present

## 2015-10-15 DIAGNOSIS — J4520 Mild intermittent asthma, uncomplicated: Secondary | ICD-10-CM | POA: Diagnosis not present

## 2015-10-15 DIAGNOSIS — J45909 Unspecified asthma, uncomplicated: Secondary | ICD-10-CM | POA: Insufficient documentation

## 2015-10-15 DIAGNOSIS — Z7689 Persons encountering health services in other specified circumstances: Secondary | ICD-10-CM | POA: Diagnosis not present

## 2015-10-15 DIAGNOSIS — G4733 Obstructive sleep apnea (adult) (pediatric): Secondary | ICD-10-CM | POA: Diagnosis not present

## 2015-10-16 DIAGNOSIS — D509 Iron deficiency anemia, unspecified: Secondary | ICD-10-CM | POA: Diagnosis not present

## 2015-10-16 DIAGNOSIS — N186 End stage renal disease: Secondary | ICD-10-CM | POA: Diagnosis not present

## 2015-10-16 DIAGNOSIS — E1129 Type 2 diabetes mellitus with other diabetic kidney complication: Secondary | ICD-10-CM | POA: Diagnosis not present

## 2015-10-16 DIAGNOSIS — N2581 Secondary hyperparathyroidism of renal origin: Secondary | ICD-10-CM | POA: Diagnosis not present

## 2015-10-17 DIAGNOSIS — T82868D Thrombosis of vascular prosthetic devices, implants and grafts, subsequent encounter: Secondary | ICD-10-CM | POA: Diagnosis not present

## 2015-10-17 DIAGNOSIS — Z992 Dependence on renal dialysis: Secondary | ICD-10-CM | POA: Diagnosis not present

## 2015-10-17 DIAGNOSIS — I871 Compression of vein: Secondary | ICD-10-CM | POA: Diagnosis not present

## 2015-10-17 DIAGNOSIS — N186 End stage renal disease: Secondary | ICD-10-CM | POA: Diagnosis not present

## 2015-10-18 DIAGNOSIS — N186 End stage renal disease: Secondary | ICD-10-CM | POA: Diagnosis not present

## 2015-10-18 DIAGNOSIS — D509 Iron deficiency anemia, unspecified: Secondary | ICD-10-CM | POA: Diagnosis not present

## 2015-10-18 DIAGNOSIS — N2581 Secondary hyperparathyroidism of renal origin: Secondary | ICD-10-CM | POA: Diagnosis not present

## 2015-10-18 DIAGNOSIS — E1129 Type 2 diabetes mellitus with other diabetic kidney complication: Secondary | ICD-10-CM | POA: Diagnosis not present

## 2015-10-21 DIAGNOSIS — I129 Hypertensive chronic kidney disease with stage 1 through stage 4 chronic kidney disease, or unspecified chronic kidney disease: Secondary | ICD-10-CM | POA: Diagnosis not present

## 2015-10-21 DIAGNOSIS — D509 Iron deficiency anemia, unspecified: Secondary | ICD-10-CM | POA: Diagnosis not present

## 2015-10-21 DIAGNOSIS — N186 End stage renal disease: Secondary | ICD-10-CM | POA: Diagnosis not present

## 2015-10-21 DIAGNOSIS — E1129 Type 2 diabetes mellitus with other diabetic kidney complication: Secondary | ICD-10-CM | POA: Diagnosis not present

## 2015-10-21 DIAGNOSIS — N2581 Secondary hyperparathyroidism of renal origin: Secondary | ICD-10-CM | POA: Diagnosis not present

## 2015-10-21 DIAGNOSIS — Z992 Dependence on renal dialysis: Secondary | ICD-10-CM | POA: Diagnosis not present

## 2015-10-23 DIAGNOSIS — D631 Anemia in chronic kidney disease: Secondary | ICD-10-CM | POA: Diagnosis not present

## 2015-10-23 DIAGNOSIS — N186 End stage renal disease: Secondary | ICD-10-CM | POA: Diagnosis not present

## 2015-10-23 DIAGNOSIS — E1129 Type 2 diabetes mellitus with other diabetic kidney complication: Secondary | ICD-10-CM | POA: Diagnosis not present

## 2015-10-23 DIAGNOSIS — D509 Iron deficiency anemia, unspecified: Secondary | ICD-10-CM | POA: Diagnosis not present

## 2015-10-23 DIAGNOSIS — N2581 Secondary hyperparathyroidism of renal origin: Secondary | ICD-10-CM | POA: Diagnosis not present

## 2015-10-25 DIAGNOSIS — N186 End stage renal disease: Secondary | ICD-10-CM | POA: Diagnosis not present

## 2015-10-25 DIAGNOSIS — N2581 Secondary hyperparathyroidism of renal origin: Secondary | ICD-10-CM | POA: Diagnosis not present

## 2015-10-25 DIAGNOSIS — E1129 Type 2 diabetes mellitus with other diabetic kidney complication: Secondary | ICD-10-CM | POA: Diagnosis not present

## 2015-10-25 DIAGNOSIS — D631 Anemia in chronic kidney disease: Secondary | ICD-10-CM | POA: Diagnosis not present

## 2015-10-25 DIAGNOSIS — D509 Iron deficiency anemia, unspecified: Secondary | ICD-10-CM | POA: Diagnosis not present

## 2015-10-28 DIAGNOSIS — N186 End stage renal disease: Secondary | ICD-10-CM | POA: Diagnosis not present

## 2015-10-28 DIAGNOSIS — D631 Anemia in chronic kidney disease: Secondary | ICD-10-CM | POA: Diagnosis not present

## 2015-10-28 DIAGNOSIS — D509 Iron deficiency anemia, unspecified: Secondary | ICD-10-CM | POA: Diagnosis not present

## 2015-10-28 DIAGNOSIS — N2581 Secondary hyperparathyroidism of renal origin: Secondary | ICD-10-CM | POA: Diagnosis not present

## 2015-10-28 DIAGNOSIS — E1129 Type 2 diabetes mellitus with other diabetic kidney complication: Secondary | ICD-10-CM | POA: Diagnosis not present

## 2015-10-30 DIAGNOSIS — E1129 Type 2 diabetes mellitus with other diabetic kidney complication: Secondary | ICD-10-CM | POA: Diagnosis not present

## 2015-10-30 DIAGNOSIS — D631 Anemia in chronic kidney disease: Secondary | ICD-10-CM | POA: Diagnosis not present

## 2015-10-30 DIAGNOSIS — N2581 Secondary hyperparathyroidism of renal origin: Secondary | ICD-10-CM | POA: Diagnosis not present

## 2015-10-30 DIAGNOSIS — D509 Iron deficiency anemia, unspecified: Secondary | ICD-10-CM | POA: Diagnosis not present

## 2015-10-30 DIAGNOSIS — N186 End stage renal disease: Secondary | ICD-10-CM | POA: Diagnosis not present

## 2015-11-01 DIAGNOSIS — N2581 Secondary hyperparathyroidism of renal origin: Secondary | ICD-10-CM | POA: Diagnosis not present

## 2015-11-01 DIAGNOSIS — D631 Anemia in chronic kidney disease: Secondary | ICD-10-CM | POA: Diagnosis not present

## 2015-11-01 DIAGNOSIS — E1129 Type 2 diabetes mellitus with other diabetic kidney complication: Secondary | ICD-10-CM | POA: Diagnosis not present

## 2015-11-01 DIAGNOSIS — N186 End stage renal disease: Secondary | ICD-10-CM | POA: Diagnosis not present

## 2015-11-01 DIAGNOSIS — D509 Iron deficiency anemia, unspecified: Secondary | ICD-10-CM | POA: Diagnosis not present

## 2015-11-04 DIAGNOSIS — E1129 Type 2 diabetes mellitus with other diabetic kidney complication: Secondary | ICD-10-CM | POA: Diagnosis not present

## 2015-11-04 DIAGNOSIS — D509 Iron deficiency anemia, unspecified: Secondary | ICD-10-CM | POA: Diagnosis not present

## 2015-11-04 DIAGNOSIS — D631 Anemia in chronic kidney disease: Secondary | ICD-10-CM | POA: Diagnosis not present

## 2015-11-04 DIAGNOSIS — N2581 Secondary hyperparathyroidism of renal origin: Secondary | ICD-10-CM | POA: Diagnosis not present

## 2015-11-04 DIAGNOSIS — N186 End stage renal disease: Secondary | ICD-10-CM | POA: Diagnosis not present

## 2015-11-06 DIAGNOSIS — E1129 Type 2 diabetes mellitus with other diabetic kidney complication: Secondary | ICD-10-CM | POA: Diagnosis not present

## 2015-11-06 DIAGNOSIS — D631 Anemia in chronic kidney disease: Secondary | ICD-10-CM | POA: Diagnosis not present

## 2015-11-06 DIAGNOSIS — N2581 Secondary hyperparathyroidism of renal origin: Secondary | ICD-10-CM | POA: Diagnosis not present

## 2015-11-06 DIAGNOSIS — D509 Iron deficiency anemia, unspecified: Secondary | ICD-10-CM | POA: Diagnosis not present

## 2015-11-06 DIAGNOSIS — N186 End stage renal disease: Secondary | ICD-10-CM | POA: Diagnosis not present

## 2015-11-08 DIAGNOSIS — D631 Anemia in chronic kidney disease: Secondary | ICD-10-CM | POA: Diagnosis not present

## 2015-11-08 DIAGNOSIS — N2581 Secondary hyperparathyroidism of renal origin: Secondary | ICD-10-CM | POA: Diagnosis not present

## 2015-11-08 DIAGNOSIS — N186 End stage renal disease: Secondary | ICD-10-CM | POA: Diagnosis not present

## 2015-11-08 DIAGNOSIS — E1129 Type 2 diabetes mellitus with other diabetic kidney complication: Secondary | ICD-10-CM | POA: Diagnosis not present

## 2015-11-08 DIAGNOSIS — D509 Iron deficiency anemia, unspecified: Secondary | ICD-10-CM | POA: Diagnosis not present

## 2015-11-11 DIAGNOSIS — N2581 Secondary hyperparathyroidism of renal origin: Secondary | ICD-10-CM | POA: Diagnosis not present

## 2015-11-11 DIAGNOSIS — D631 Anemia in chronic kidney disease: Secondary | ICD-10-CM | POA: Diagnosis not present

## 2015-11-11 DIAGNOSIS — N186 End stage renal disease: Secondary | ICD-10-CM | POA: Diagnosis not present

## 2015-11-11 DIAGNOSIS — D509 Iron deficiency anemia, unspecified: Secondary | ICD-10-CM | POA: Diagnosis not present

## 2015-11-11 DIAGNOSIS — E1129 Type 2 diabetes mellitus with other diabetic kidney complication: Secondary | ICD-10-CM | POA: Diagnosis not present

## 2015-11-13 DIAGNOSIS — D631 Anemia in chronic kidney disease: Secondary | ICD-10-CM | POA: Diagnosis not present

## 2015-11-13 DIAGNOSIS — N186 End stage renal disease: Secondary | ICD-10-CM | POA: Diagnosis not present

## 2015-11-13 DIAGNOSIS — N2581 Secondary hyperparathyroidism of renal origin: Secondary | ICD-10-CM | POA: Diagnosis not present

## 2015-11-13 DIAGNOSIS — D509 Iron deficiency anemia, unspecified: Secondary | ICD-10-CM | POA: Diagnosis not present

## 2015-11-13 DIAGNOSIS — E1129 Type 2 diabetes mellitus with other diabetic kidney complication: Secondary | ICD-10-CM | POA: Diagnosis not present

## 2015-11-15 DIAGNOSIS — N186 End stage renal disease: Secondary | ICD-10-CM | POA: Diagnosis not present

## 2015-11-15 DIAGNOSIS — D631 Anemia in chronic kidney disease: Secondary | ICD-10-CM | POA: Diagnosis not present

## 2015-11-15 DIAGNOSIS — D509 Iron deficiency anemia, unspecified: Secondary | ICD-10-CM | POA: Diagnosis not present

## 2015-11-15 DIAGNOSIS — E1129 Type 2 diabetes mellitus with other diabetic kidney complication: Secondary | ICD-10-CM | POA: Diagnosis not present

## 2015-11-15 DIAGNOSIS — N2581 Secondary hyperparathyroidism of renal origin: Secondary | ICD-10-CM | POA: Diagnosis not present

## 2015-11-18 DIAGNOSIS — E1129 Type 2 diabetes mellitus with other diabetic kidney complication: Secondary | ICD-10-CM | POA: Diagnosis not present

## 2015-11-18 DIAGNOSIS — N186 End stage renal disease: Secondary | ICD-10-CM | POA: Diagnosis not present

## 2015-11-18 DIAGNOSIS — N2581 Secondary hyperparathyroidism of renal origin: Secondary | ICD-10-CM | POA: Diagnosis not present

## 2015-11-18 DIAGNOSIS — D631 Anemia in chronic kidney disease: Secondary | ICD-10-CM | POA: Diagnosis not present

## 2015-11-18 DIAGNOSIS — D509 Iron deficiency anemia, unspecified: Secondary | ICD-10-CM | POA: Diagnosis not present

## 2015-11-20 DIAGNOSIS — D509 Iron deficiency anemia, unspecified: Secondary | ICD-10-CM | POA: Diagnosis not present

## 2015-11-20 DIAGNOSIS — N186 End stage renal disease: Secondary | ICD-10-CM | POA: Diagnosis not present

## 2015-11-20 DIAGNOSIS — D631 Anemia in chronic kidney disease: Secondary | ICD-10-CM | POA: Diagnosis not present

## 2015-11-20 DIAGNOSIS — E1129 Type 2 diabetes mellitus with other diabetic kidney complication: Secondary | ICD-10-CM | POA: Diagnosis not present

## 2015-11-20 DIAGNOSIS — N2581 Secondary hyperparathyroidism of renal origin: Secondary | ICD-10-CM | POA: Diagnosis not present

## 2015-11-21 DIAGNOSIS — Z992 Dependence on renal dialysis: Secondary | ICD-10-CM | POA: Diagnosis not present

## 2015-11-21 DIAGNOSIS — N186 End stage renal disease: Secondary | ICD-10-CM | POA: Diagnosis not present

## 2015-11-21 DIAGNOSIS — I129 Hypertensive chronic kidney disease with stage 1 through stage 4 chronic kidney disease, or unspecified chronic kidney disease: Secondary | ICD-10-CM | POA: Diagnosis not present

## 2015-11-22 DIAGNOSIS — D509 Iron deficiency anemia, unspecified: Secondary | ICD-10-CM | POA: Diagnosis not present

## 2015-11-22 DIAGNOSIS — Z23 Encounter for immunization: Secondary | ICD-10-CM | POA: Diagnosis not present

## 2015-11-22 DIAGNOSIS — N186 End stage renal disease: Secondary | ICD-10-CM | POA: Diagnosis not present

## 2015-11-22 DIAGNOSIS — E1129 Type 2 diabetes mellitus with other diabetic kidney complication: Secondary | ICD-10-CM | POA: Diagnosis not present

## 2015-11-22 DIAGNOSIS — D631 Anemia in chronic kidney disease: Secondary | ICD-10-CM | POA: Diagnosis not present

## 2015-11-22 DIAGNOSIS — N2581 Secondary hyperparathyroidism of renal origin: Secondary | ICD-10-CM | POA: Diagnosis not present

## 2015-11-25 DIAGNOSIS — Z23 Encounter for immunization: Secondary | ICD-10-CM | POA: Diagnosis not present

## 2015-11-25 DIAGNOSIS — N186 End stage renal disease: Secondary | ICD-10-CM | POA: Diagnosis not present

## 2015-11-25 DIAGNOSIS — D631 Anemia in chronic kidney disease: Secondary | ICD-10-CM | POA: Diagnosis not present

## 2015-11-25 DIAGNOSIS — D509 Iron deficiency anemia, unspecified: Secondary | ICD-10-CM | POA: Diagnosis not present

## 2015-11-25 DIAGNOSIS — N2581 Secondary hyperparathyroidism of renal origin: Secondary | ICD-10-CM | POA: Diagnosis not present

## 2015-11-25 DIAGNOSIS — E1129 Type 2 diabetes mellitus with other diabetic kidney complication: Secondary | ICD-10-CM | POA: Diagnosis not present

## 2015-11-26 DIAGNOSIS — G4700 Insomnia, unspecified: Secondary | ICD-10-CM | POA: Diagnosis not present

## 2015-11-26 DIAGNOSIS — Z9989 Dependence on other enabling machines and devices: Secondary | ICD-10-CM | POA: Diagnosis not present

## 2015-11-26 DIAGNOSIS — Z6832 Body mass index (BMI) 32.0-32.9, adult: Secondary | ICD-10-CM | POA: Diagnosis not present

## 2015-11-26 DIAGNOSIS — G4733 Obstructive sleep apnea (adult) (pediatric): Secondary | ICD-10-CM | POA: Diagnosis not present

## 2015-11-27 DIAGNOSIS — D509 Iron deficiency anemia, unspecified: Secondary | ICD-10-CM | POA: Diagnosis not present

## 2015-11-27 DIAGNOSIS — E1129 Type 2 diabetes mellitus with other diabetic kidney complication: Secondary | ICD-10-CM | POA: Diagnosis not present

## 2015-11-27 DIAGNOSIS — Z23 Encounter for immunization: Secondary | ICD-10-CM | POA: Diagnosis not present

## 2015-11-27 DIAGNOSIS — N2581 Secondary hyperparathyroidism of renal origin: Secondary | ICD-10-CM | POA: Diagnosis not present

## 2015-11-27 DIAGNOSIS — D631 Anemia in chronic kidney disease: Secondary | ICD-10-CM | POA: Diagnosis not present

## 2015-11-27 DIAGNOSIS — N186 End stage renal disease: Secondary | ICD-10-CM | POA: Diagnosis not present

## 2015-11-29 DIAGNOSIS — E1129 Type 2 diabetes mellitus with other diabetic kidney complication: Secondary | ICD-10-CM | POA: Diagnosis not present

## 2015-11-29 DIAGNOSIS — D509 Iron deficiency anemia, unspecified: Secondary | ICD-10-CM | POA: Diagnosis not present

## 2015-11-29 DIAGNOSIS — Z23 Encounter for immunization: Secondary | ICD-10-CM | POA: Diagnosis not present

## 2015-11-29 DIAGNOSIS — N186 End stage renal disease: Secondary | ICD-10-CM | POA: Diagnosis not present

## 2015-11-29 DIAGNOSIS — N2581 Secondary hyperparathyroidism of renal origin: Secondary | ICD-10-CM | POA: Diagnosis not present

## 2015-11-29 DIAGNOSIS — D631 Anemia in chronic kidney disease: Secondary | ICD-10-CM | POA: Diagnosis not present

## 2015-12-02 DIAGNOSIS — Z23 Encounter for immunization: Secondary | ICD-10-CM | POA: Diagnosis not present

## 2015-12-02 DIAGNOSIS — E1129 Type 2 diabetes mellitus with other diabetic kidney complication: Secondary | ICD-10-CM | POA: Diagnosis not present

## 2015-12-02 DIAGNOSIS — N186 End stage renal disease: Secondary | ICD-10-CM | POA: Diagnosis not present

## 2015-12-02 DIAGNOSIS — D631 Anemia in chronic kidney disease: Secondary | ICD-10-CM | POA: Diagnosis not present

## 2015-12-02 DIAGNOSIS — N2581 Secondary hyperparathyroidism of renal origin: Secondary | ICD-10-CM | POA: Diagnosis not present

## 2015-12-02 DIAGNOSIS — D509 Iron deficiency anemia, unspecified: Secondary | ICD-10-CM | POA: Diagnosis not present

## 2015-12-04 DIAGNOSIS — N2581 Secondary hyperparathyroidism of renal origin: Secondary | ICD-10-CM | POA: Diagnosis not present

## 2015-12-04 DIAGNOSIS — Z23 Encounter for immunization: Secondary | ICD-10-CM | POA: Diagnosis not present

## 2015-12-04 DIAGNOSIS — D631 Anemia in chronic kidney disease: Secondary | ICD-10-CM | POA: Diagnosis not present

## 2015-12-04 DIAGNOSIS — N186 End stage renal disease: Secondary | ICD-10-CM | POA: Diagnosis not present

## 2015-12-04 DIAGNOSIS — E1129 Type 2 diabetes mellitus with other diabetic kidney complication: Secondary | ICD-10-CM | POA: Diagnosis not present

## 2015-12-04 DIAGNOSIS — D509 Iron deficiency anemia, unspecified: Secondary | ICD-10-CM | POA: Diagnosis not present

## 2015-12-04 DIAGNOSIS — Z8719 Personal history of other diseases of the digestive system: Secondary | ICD-10-CM | POA: Diagnosis not present

## 2015-12-04 DIAGNOSIS — R1013 Epigastric pain: Secondary | ICD-10-CM | POA: Diagnosis not present

## 2015-12-06 DIAGNOSIS — D509 Iron deficiency anemia, unspecified: Secondary | ICD-10-CM | POA: Diagnosis not present

## 2015-12-06 DIAGNOSIS — E1129 Type 2 diabetes mellitus with other diabetic kidney complication: Secondary | ICD-10-CM | POA: Diagnosis not present

## 2015-12-06 DIAGNOSIS — N186 End stage renal disease: Secondary | ICD-10-CM | POA: Diagnosis not present

## 2015-12-06 DIAGNOSIS — N2581 Secondary hyperparathyroidism of renal origin: Secondary | ICD-10-CM | POA: Diagnosis not present

## 2015-12-06 DIAGNOSIS — D631 Anemia in chronic kidney disease: Secondary | ICD-10-CM | POA: Diagnosis not present

## 2015-12-06 DIAGNOSIS — Z23 Encounter for immunization: Secondary | ICD-10-CM | POA: Diagnosis not present

## 2015-12-09 DIAGNOSIS — E1129 Type 2 diabetes mellitus with other diabetic kidney complication: Secondary | ICD-10-CM | POA: Diagnosis not present

## 2015-12-09 DIAGNOSIS — N2581 Secondary hyperparathyroidism of renal origin: Secondary | ICD-10-CM | POA: Diagnosis not present

## 2015-12-09 DIAGNOSIS — Z23 Encounter for immunization: Secondary | ICD-10-CM | POA: Diagnosis not present

## 2015-12-09 DIAGNOSIS — D509 Iron deficiency anemia, unspecified: Secondary | ICD-10-CM | POA: Diagnosis not present

## 2015-12-09 DIAGNOSIS — N186 End stage renal disease: Secondary | ICD-10-CM | POA: Diagnosis not present

## 2015-12-09 DIAGNOSIS — D631 Anemia in chronic kidney disease: Secondary | ICD-10-CM | POA: Diagnosis not present

## 2015-12-11 DIAGNOSIS — D509 Iron deficiency anemia, unspecified: Secondary | ICD-10-CM | POA: Diagnosis not present

## 2015-12-11 DIAGNOSIS — Z23 Encounter for immunization: Secondary | ICD-10-CM | POA: Diagnosis not present

## 2015-12-11 DIAGNOSIS — N2581 Secondary hyperparathyroidism of renal origin: Secondary | ICD-10-CM | POA: Diagnosis not present

## 2015-12-11 DIAGNOSIS — D631 Anemia in chronic kidney disease: Secondary | ICD-10-CM | POA: Diagnosis not present

## 2015-12-11 DIAGNOSIS — E1129 Type 2 diabetes mellitus with other diabetic kidney complication: Secondary | ICD-10-CM | POA: Diagnosis not present

## 2015-12-11 DIAGNOSIS — N186 End stage renal disease: Secondary | ICD-10-CM | POA: Diagnosis not present

## 2015-12-13 DIAGNOSIS — N2581 Secondary hyperparathyroidism of renal origin: Secondary | ICD-10-CM | POA: Diagnosis not present

## 2015-12-13 DIAGNOSIS — N186 End stage renal disease: Secondary | ICD-10-CM | POA: Diagnosis not present

## 2015-12-13 DIAGNOSIS — Z23 Encounter for immunization: Secondary | ICD-10-CM | POA: Diagnosis not present

## 2015-12-13 DIAGNOSIS — E1129 Type 2 diabetes mellitus with other diabetic kidney complication: Secondary | ICD-10-CM | POA: Diagnosis not present

## 2015-12-13 DIAGNOSIS — D631 Anemia in chronic kidney disease: Secondary | ICD-10-CM | POA: Diagnosis not present

## 2015-12-13 DIAGNOSIS — D509 Iron deficiency anemia, unspecified: Secondary | ICD-10-CM | POA: Diagnosis not present

## 2015-12-16 DIAGNOSIS — E1129 Type 2 diabetes mellitus with other diabetic kidney complication: Secondary | ICD-10-CM | POA: Diagnosis not present

## 2015-12-16 DIAGNOSIS — N186 End stage renal disease: Secondary | ICD-10-CM | POA: Diagnosis not present

## 2015-12-16 DIAGNOSIS — D631 Anemia in chronic kidney disease: Secondary | ICD-10-CM | POA: Diagnosis not present

## 2015-12-16 DIAGNOSIS — Z23 Encounter for immunization: Secondary | ICD-10-CM | POA: Diagnosis not present

## 2015-12-16 DIAGNOSIS — D509 Iron deficiency anemia, unspecified: Secondary | ICD-10-CM | POA: Diagnosis not present

## 2015-12-16 DIAGNOSIS — N2581 Secondary hyperparathyroidism of renal origin: Secondary | ICD-10-CM | POA: Diagnosis not present

## 2015-12-18 DIAGNOSIS — D509 Iron deficiency anemia, unspecified: Secondary | ICD-10-CM | POA: Diagnosis not present

## 2015-12-18 DIAGNOSIS — N186 End stage renal disease: Secondary | ICD-10-CM | POA: Diagnosis not present

## 2015-12-18 DIAGNOSIS — E1129 Type 2 diabetes mellitus with other diabetic kidney complication: Secondary | ICD-10-CM | POA: Diagnosis not present

## 2015-12-18 DIAGNOSIS — Z23 Encounter for immunization: Secondary | ICD-10-CM | POA: Diagnosis not present

## 2015-12-18 DIAGNOSIS — D631 Anemia in chronic kidney disease: Secondary | ICD-10-CM | POA: Diagnosis not present

## 2015-12-18 DIAGNOSIS — N2581 Secondary hyperparathyroidism of renal origin: Secondary | ICD-10-CM | POA: Diagnosis not present

## 2015-12-20 DIAGNOSIS — D631 Anemia in chronic kidney disease: Secondary | ICD-10-CM | POA: Diagnosis not present

## 2015-12-20 DIAGNOSIS — E1129 Type 2 diabetes mellitus with other diabetic kidney complication: Secondary | ICD-10-CM | POA: Diagnosis not present

## 2015-12-20 DIAGNOSIS — N2581 Secondary hyperparathyroidism of renal origin: Secondary | ICD-10-CM | POA: Diagnosis not present

## 2015-12-20 DIAGNOSIS — Z23 Encounter for immunization: Secondary | ICD-10-CM | POA: Diagnosis not present

## 2015-12-20 DIAGNOSIS — N186 End stage renal disease: Secondary | ICD-10-CM | POA: Diagnosis not present

## 2015-12-20 DIAGNOSIS — D509 Iron deficiency anemia, unspecified: Secondary | ICD-10-CM | POA: Diagnosis not present

## 2015-12-21 DIAGNOSIS — I129 Hypertensive chronic kidney disease with stage 1 through stage 4 chronic kidney disease, or unspecified chronic kidney disease: Secondary | ICD-10-CM | POA: Diagnosis not present

## 2015-12-21 DIAGNOSIS — N186 End stage renal disease: Secondary | ICD-10-CM | POA: Diagnosis not present

## 2015-12-21 DIAGNOSIS — Z992 Dependence on renal dialysis: Secondary | ICD-10-CM | POA: Diagnosis not present

## 2015-12-23 DIAGNOSIS — E1129 Type 2 diabetes mellitus with other diabetic kidney complication: Secondary | ICD-10-CM | POA: Diagnosis not present

## 2015-12-23 DIAGNOSIS — D631 Anemia in chronic kidney disease: Secondary | ICD-10-CM | POA: Diagnosis not present

## 2015-12-23 DIAGNOSIS — D509 Iron deficiency anemia, unspecified: Secondary | ICD-10-CM | POA: Diagnosis not present

## 2015-12-23 DIAGNOSIS — N186 End stage renal disease: Secondary | ICD-10-CM | POA: Diagnosis not present

## 2015-12-23 DIAGNOSIS — N2581 Secondary hyperparathyroidism of renal origin: Secondary | ICD-10-CM | POA: Diagnosis not present

## 2015-12-24 DIAGNOSIS — K2930 Chronic superficial gastritis without bleeding: Secondary | ICD-10-CM | POA: Diagnosis not present

## 2015-12-24 DIAGNOSIS — K2950 Unspecified chronic gastritis without bleeding: Secondary | ICD-10-CM | POA: Diagnosis not present

## 2015-12-24 DIAGNOSIS — R1013 Epigastric pain: Secondary | ICD-10-CM | POA: Diagnosis not present

## 2015-12-24 DIAGNOSIS — B9681 Helicobacter pylori [H. pylori] as the cause of diseases classified elsewhere: Secondary | ICD-10-CM | POA: Diagnosis not present

## 2015-12-24 DIAGNOSIS — K3189 Other diseases of stomach and duodenum: Secondary | ICD-10-CM | POA: Diagnosis not present

## 2015-12-25 DIAGNOSIS — E1129 Type 2 diabetes mellitus with other diabetic kidney complication: Secondary | ICD-10-CM | POA: Diagnosis not present

## 2015-12-25 DIAGNOSIS — N186 End stage renal disease: Secondary | ICD-10-CM | POA: Diagnosis not present

## 2015-12-25 DIAGNOSIS — D631 Anemia in chronic kidney disease: Secondary | ICD-10-CM | POA: Diagnosis not present

## 2015-12-25 DIAGNOSIS — D509 Iron deficiency anemia, unspecified: Secondary | ICD-10-CM | POA: Diagnosis not present

## 2015-12-25 DIAGNOSIS — N2581 Secondary hyperparathyroidism of renal origin: Secondary | ICD-10-CM | POA: Diagnosis not present

## 2015-12-26 DIAGNOSIS — T82858D Stenosis of vascular prosthetic devices, implants and grafts, subsequent encounter: Secondary | ICD-10-CM | POA: Diagnosis not present

## 2015-12-26 DIAGNOSIS — I871 Compression of vein: Secondary | ICD-10-CM | POA: Diagnosis not present

## 2015-12-26 DIAGNOSIS — Z992 Dependence on renal dialysis: Secondary | ICD-10-CM | POA: Diagnosis not present

## 2015-12-26 DIAGNOSIS — N186 End stage renal disease: Secondary | ICD-10-CM | POA: Diagnosis not present

## 2015-12-27 DIAGNOSIS — D631 Anemia in chronic kidney disease: Secondary | ICD-10-CM | POA: Diagnosis not present

## 2015-12-27 DIAGNOSIS — N2581 Secondary hyperparathyroidism of renal origin: Secondary | ICD-10-CM | POA: Diagnosis not present

## 2015-12-27 DIAGNOSIS — E1129 Type 2 diabetes mellitus with other diabetic kidney complication: Secondary | ICD-10-CM | POA: Diagnosis not present

## 2015-12-27 DIAGNOSIS — N186 End stage renal disease: Secondary | ICD-10-CM | POA: Diagnosis not present

## 2015-12-27 DIAGNOSIS — D509 Iron deficiency anemia, unspecified: Secondary | ICD-10-CM | POA: Diagnosis not present

## 2015-12-30 DIAGNOSIS — D631 Anemia in chronic kidney disease: Secondary | ICD-10-CM | POA: Diagnosis not present

## 2015-12-30 DIAGNOSIS — N2581 Secondary hyperparathyroidism of renal origin: Secondary | ICD-10-CM | POA: Diagnosis not present

## 2015-12-30 DIAGNOSIS — N186 End stage renal disease: Secondary | ICD-10-CM | POA: Diagnosis not present

## 2015-12-30 DIAGNOSIS — E1129 Type 2 diabetes mellitus with other diabetic kidney complication: Secondary | ICD-10-CM | POA: Diagnosis not present

## 2015-12-30 DIAGNOSIS — D509 Iron deficiency anemia, unspecified: Secondary | ICD-10-CM | POA: Diagnosis not present

## 2016-01-01 DIAGNOSIS — K2930 Chronic superficial gastritis without bleeding: Secondary | ICD-10-CM | POA: Diagnosis not present

## 2016-01-01 DIAGNOSIS — B9681 Helicobacter pylori [H. pylori] as the cause of diseases classified elsewhere: Secondary | ICD-10-CM | POA: Diagnosis not present

## 2016-01-01 DIAGNOSIS — N2581 Secondary hyperparathyroidism of renal origin: Secondary | ICD-10-CM | POA: Diagnosis not present

## 2016-01-01 DIAGNOSIS — D631 Anemia in chronic kidney disease: Secondary | ICD-10-CM | POA: Diagnosis not present

## 2016-01-01 DIAGNOSIS — D509 Iron deficiency anemia, unspecified: Secondary | ICD-10-CM | POA: Diagnosis not present

## 2016-01-01 DIAGNOSIS — N186 End stage renal disease: Secondary | ICD-10-CM | POA: Diagnosis not present

## 2016-01-01 DIAGNOSIS — E1129 Type 2 diabetes mellitus with other diabetic kidney complication: Secondary | ICD-10-CM | POA: Diagnosis not present

## 2016-01-01 DIAGNOSIS — K2950 Unspecified chronic gastritis without bleeding: Secondary | ICD-10-CM | POA: Diagnosis not present

## 2016-01-02 ENCOUNTER — Other Ambulatory Visit: Payer: Self-pay | Admitting: Nephrology

## 2016-01-02 DIAGNOSIS — R1010 Upper abdominal pain, unspecified: Secondary | ICD-10-CM

## 2016-01-03 DIAGNOSIS — E1129 Type 2 diabetes mellitus with other diabetic kidney complication: Secondary | ICD-10-CM | POA: Diagnosis not present

## 2016-01-03 DIAGNOSIS — N2581 Secondary hyperparathyroidism of renal origin: Secondary | ICD-10-CM | POA: Diagnosis not present

## 2016-01-03 DIAGNOSIS — D631 Anemia in chronic kidney disease: Secondary | ICD-10-CM | POA: Diagnosis not present

## 2016-01-03 DIAGNOSIS — N186 End stage renal disease: Secondary | ICD-10-CM | POA: Diagnosis not present

## 2016-01-03 DIAGNOSIS — D509 Iron deficiency anemia, unspecified: Secondary | ICD-10-CM | POA: Diagnosis not present

## 2016-01-06 DIAGNOSIS — E1129 Type 2 diabetes mellitus with other diabetic kidney complication: Secondary | ICD-10-CM | POA: Diagnosis not present

## 2016-01-06 DIAGNOSIS — D631 Anemia in chronic kidney disease: Secondary | ICD-10-CM | POA: Diagnosis not present

## 2016-01-06 DIAGNOSIS — N186 End stage renal disease: Secondary | ICD-10-CM | POA: Diagnosis not present

## 2016-01-06 DIAGNOSIS — D509 Iron deficiency anemia, unspecified: Secondary | ICD-10-CM | POA: Diagnosis not present

## 2016-01-06 DIAGNOSIS — N2581 Secondary hyperparathyroidism of renal origin: Secondary | ICD-10-CM | POA: Diagnosis not present

## 2016-01-07 DIAGNOSIS — E119 Type 2 diabetes mellitus without complications: Secondary | ICD-10-CM | POA: Diagnosis not present

## 2016-01-07 DIAGNOSIS — H31092 Other chorioretinal scars, left eye: Secondary | ICD-10-CM | POA: Diagnosis not present

## 2016-01-07 DIAGNOSIS — H3562 Retinal hemorrhage, left eye: Secondary | ICD-10-CM | POA: Diagnosis not present

## 2016-01-07 DIAGNOSIS — H35033 Hypertensive retinopathy, bilateral: Secondary | ICD-10-CM | POA: Diagnosis not present

## 2016-01-10 DIAGNOSIS — D631 Anemia in chronic kidney disease: Secondary | ICD-10-CM | POA: Diagnosis not present

## 2016-01-10 DIAGNOSIS — D509 Iron deficiency anemia, unspecified: Secondary | ICD-10-CM | POA: Diagnosis not present

## 2016-01-10 DIAGNOSIS — N186 End stage renal disease: Secondary | ICD-10-CM | POA: Diagnosis not present

## 2016-01-10 DIAGNOSIS — E1129 Type 2 diabetes mellitus with other diabetic kidney complication: Secondary | ICD-10-CM | POA: Diagnosis not present

## 2016-01-10 DIAGNOSIS — N2581 Secondary hyperparathyroidism of renal origin: Secondary | ICD-10-CM | POA: Diagnosis not present

## 2016-01-13 DIAGNOSIS — D509 Iron deficiency anemia, unspecified: Secondary | ICD-10-CM | POA: Diagnosis not present

## 2016-01-13 DIAGNOSIS — N2581 Secondary hyperparathyroidism of renal origin: Secondary | ICD-10-CM | POA: Diagnosis not present

## 2016-01-13 DIAGNOSIS — D631 Anemia in chronic kidney disease: Secondary | ICD-10-CM | POA: Diagnosis not present

## 2016-01-13 DIAGNOSIS — N186 End stage renal disease: Secondary | ICD-10-CM | POA: Diagnosis not present

## 2016-01-13 DIAGNOSIS — E1129 Type 2 diabetes mellitus with other diabetic kidney complication: Secondary | ICD-10-CM | POA: Diagnosis not present

## 2016-01-14 ENCOUNTER — Other Ambulatory Visit: Payer: Medicare Other

## 2016-01-14 DIAGNOSIS — G4719 Other hypersomnia: Secondary | ICD-10-CM | POA: Diagnosis not present

## 2016-01-14 DIAGNOSIS — Z6832 Body mass index (BMI) 32.0-32.9, adult: Secondary | ICD-10-CM | POA: Diagnosis not present

## 2016-01-14 DIAGNOSIS — G4733 Obstructive sleep apnea (adult) (pediatric): Secondary | ICD-10-CM | POA: Diagnosis not present

## 2016-01-14 DIAGNOSIS — E669 Obesity, unspecified: Secondary | ICD-10-CM | POA: Diagnosis not present

## 2016-01-14 DIAGNOSIS — Z9989 Dependence on other enabling machines and devices: Secondary | ICD-10-CM | POA: Diagnosis not present

## 2016-01-15 ENCOUNTER — Ambulatory Visit
Admission: RE | Admit: 2016-01-15 | Discharge: 2016-01-15 | Disposition: A | Discharge status: Home / Self Care | Payer: Medicare Other | Source: Ambulatory Visit | Attending: Nephrology | Admitting: Nephrology

## 2016-01-15 DIAGNOSIS — N2581 Secondary hyperparathyroidism of renal origin: Secondary | ICD-10-CM | POA: Diagnosis not present

## 2016-01-15 DIAGNOSIS — R1010 Upper abdominal pain, unspecified: Secondary | ICD-10-CM

## 2016-01-15 DIAGNOSIS — R1011 Right upper quadrant pain: Secondary | ICD-10-CM | POA: Diagnosis not present

## 2016-01-15 DIAGNOSIS — D631 Anemia in chronic kidney disease: Secondary | ICD-10-CM | POA: Diagnosis not present

## 2016-01-15 DIAGNOSIS — D509 Iron deficiency anemia, unspecified: Secondary | ICD-10-CM | POA: Diagnosis not present

## 2016-01-15 DIAGNOSIS — E1129 Type 2 diabetes mellitus with other diabetic kidney complication: Secondary | ICD-10-CM | POA: Diagnosis not present

## 2016-01-15 DIAGNOSIS — N186 End stage renal disease: Secondary | ICD-10-CM | POA: Diagnosis not present

## 2016-01-20 DIAGNOSIS — E1129 Type 2 diabetes mellitus with other diabetic kidney complication: Secondary | ICD-10-CM | POA: Diagnosis not present

## 2016-01-20 DIAGNOSIS — N186 End stage renal disease: Secondary | ICD-10-CM | POA: Diagnosis not present

## 2016-01-20 DIAGNOSIS — N2581 Secondary hyperparathyroidism of renal origin: Secondary | ICD-10-CM | POA: Diagnosis not present

## 2016-01-20 DIAGNOSIS — D631 Anemia in chronic kidney disease: Secondary | ICD-10-CM | POA: Diagnosis not present

## 2016-01-20 DIAGNOSIS — D509 Iron deficiency anemia, unspecified: Secondary | ICD-10-CM | POA: Diagnosis not present

## 2016-01-21 DIAGNOSIS — N186 End stage renal disease: Secondary | ICD-10-CM | POA: Diagnosis not present

## 2016-01-21 DIAGNOSIS — I129 Hypertensive chronic kidney disease with stage 1 through stage 4 chronic kidney disease, or unspecified chronic kidney disease: Secondary | ICD-10-CM | POA: Diagnosis not present

## 2016-01-21 DIAGNOSIS — Z992 Dependence on renal dialysis: Secondary | ICD-10-CM | POA: Diagnosis not present

## 2016-01-22 DIAGNOSIS — E1129 Type 2 diabetes mellitus with other diabetic kidney complication: Secondary | ICD-10-CM | POA: Diagnosis not present

## 2016-01-22 DIAGNOSIS — D509 Iron deficiency anemia, unspecified: Secondary | ICD-10-CM | POA: Diagnosis not present

## 2016-01-22 DIAGNOSIS — N186 End stage renal disease: Secondary | ICD-10-CM | POA: Diagnosis not present

## 2016-01-22 DIAGNOSIS — N2581 Secondary hyperparathyroidism of renal origin: Secondary | ICD-10-CM | POA: Diagnosis not present

## 2016-01-24 DIAGNOSIS — E1129 Type 2 diabetes mellitus with other diabetic kidney complication: Secondary | ICD-10-CM | POA: Diagnosis not present

## 2016-01-24 DIAGNOSIS — N186 End stage renal disease: Secondary | ICD-10-CM | POA: Diagnosis not present

## 2016-01-24 DIAGNOSIS — N2581 Secondary hyperparathyroidism of renal origin: Secondary | ICD-10-CM | POA: Diagnosis not present

## 2016-01-24 DIAGNOSIS — D509 Iron deficiency anemia, unspecified: Secondary | ICD-10-CM | POA: Diagnosis not present

## 2016-01-27 DIAGNOSIS — D509 Iron deficiency anemia, unspecified: Secondary | ICD-10-CM | POA: Diagnosis not present

## 2016-01-27 DIAGNOSIS — E1129 Type 2 diabetes mellitus with other diabetic kidney complication: Secondary | ICD-10-CM | POA: Diagnosis not present

## 2016-01-27 DIAGNOSIS — N2581 Secondary hyperparathyroidism of renal origin: Secondary | ICD-10-CM | POA: Diagnosis not present

## 2016-01-27 DIAGNOSIS — N186 End stage renal disease: Secondary | ICD-10-CM | POA: Diagnosis not present

## 2016-01-29 DIAGNOSIS — N186 End stage renal disease: Secondary | ICD-10-CM | POA: Diagnosis not present

## 2016-01-29 DIAGNOSIS — E1129 Type 2 diabetes mellitus with other diabetic kidney complication: Secondary | ICD-10-CM | POA: Diagnosis not present

## 2016-01-29 DIAGNOSIS — N2581 Secondary hyperparathyroidism of renal origin: Secondary | ICD-10-CM | POA: Diagnosis not present

## 2016-01-29 DIAGNOSIS — D509 Iron deficiency anemia, unspecified: Secondary | ICD-10-CM | POA: Diagnosis not present

## 2016-01-31 DIAGNOSIS — N2581 Secondary hyperparathyroidism of renal origin: Secondary | ICD-10-CM | POA: Diagnosis not present

## 2016-01-31 DIAGNOSIS — D509 Iron deficiency anemia, unspecified: Secondary | ICD-10-CM | POA: Diagnosis not present

## 2016-01-31 DIAGNOSIS — N186 End stage renal disease: Secondary | ICD-10-CM | POA: Diagnosis not present

## 2016-01-31 DIAGNOSIS — E1129 Type 2 diabetes mellitus with other diabetic kidney complication: Secondary | ICD-10-CM | POA: Diagnosis not present

## 2016-02-03 DIAGNOSIS — N2581 Secondary hyperparathyroidism of renal origin: Secondary | ICD-10-CM | POA: Diagnosis not present

## 2016-02-03 DIAGNOSIS — N186 End stage renal disease: Secondary | ICD-10-CM | POA: Diagnosis not present

## 2016-02-03 DIAGNOSIS — E1129 Type 2 diabetes mellitus with other diabetic kidney complication: Secondary | ICD-10-CM | POA: Diagnosis not present

## 2016-02-03 DIAGNOSIS — D509 Iron deficiency anemia, unspecified: Secondary | ICD-10-CM | POA: Diagnosis not present

## 2016-02-05 DIAGNOSIS — E1129 Type 2 diabetes mellitus with other diabetic kidney complication: Secondary | ICD-10-CM | POA: Diagnosis not present

## 2016-02-05 DIAGNOSIS — N2581 Secondary hyperparathyroidism of renal origin: Secondary | ICD-10-CM | POA: Diagnosis not present

## 2016-02-05 DIAGNOSIS — D509 Iron deficiency anemia, unspecified: Secondary | ICD-10-CM | POA: Diagnosis not present

## 2016-02-05 DIAGNOSIS — N186 End stage renal disease: Secondary | ICD-10-CM | POA: Diagnosis not present

## 2016-02-07 DIAGNOSIS — N2581 Secondary hyperparathyroidism of renal origin: Secondary | ICD-10-CM | POA: Diagnosis not present

## 2016-02-07 DIAGNOSIS — E1129 Type 2 diabetes mellitus with other diabetic kidney complication: Secondary | ICD-10-CM | POA: Diagnosis not present

## 2016-02-07 DIAGNOSIS — D509 Iron deficiency anemia, unspecified: Secondary | ICD-10-CM | POA: Diagnosis not present

## 2016-02-07 DIAGNOSIS — N186 End stage renal disease: Secondary | ICD-10-CM | POA: Diagnosis not present

## 2016-02-09 DIAGNOSIS — N2581 Secondary hyperparathyroidism of renal origin: Secondary | ICD-10-CM | POA: Diagnosis not present

## 2016-02-09 DIAGNOSIS — N186 End stage renal disease: Secondary | ICD-10-CM | POA: Diagnosis not present

## 2016-02-09 DIAGNOSIS — E1129 Type 2 diabetes mellitus with other diabetic kidney complication: Secondary | ICD-10-CM | POA: Diagnosis not present

## 2016-02-09 DIAGNOSIS — D509 Iron deficiency anemia, unspecified: Secondary | ICD-10-CM | POA: Diagnosis not present

## 2016-02-11 DIAGNOSIS — D509 Iron deficiency anemia, unspecified: Secondary | ICD-10-CM | POA: Diagnosis not present

## 2016-02-11 DIAGNOSIS — E1129 Type 2 diabetes mellitus with other diabetic kidney complication: Secondary | ICD-10-CM | POA: Diagnosis not present

## 2016-02-11 DIAGNOSIS — N186 End stage renal disease: Secondary | ICD-10-CM | POA: Diagnosis not present

## 2016-02-11 DIAGNOSIS — N2581 Secondary hyperparathyroidism of renal origin: Secondary | ICD-10-CM | POA: Diagnosis not present

## 2016-02-14 DIAGNOSIS — D509 Iron deficiency anemia, unspecified: Secondary | ICD-10-CM | POA: Diagnosis not present

## 2016-02-14 DIAGNOSIS — E1129 Type 2 diabetes mellitus with other diabetic kidney complication: Secondary | ICD-10-CM | POA: Diagnosis not present

## 2016-02-14 DIAGNOSIS — N186 End stage renal disease: Secondary | ICD-10-CM | POA: Diagnosis not present

## 2016-02-14 DIAGNOSIS — N2581 Secondary hyperparathyroidism of renal origin: Secondary | ICD-10-CM | POA: Diagnosis not present

## 2016-02-17 DIAGNOSIS — N186 End stage renal disease: Secondary | ICD-10-CM | POA: Diagnosis not present

## 2016-02-17 DIAGNOSIS — N2581 Secondary hyperparathyroidism of renal origin: Secondary | ICD-10-CM | POA: Diagnosis not present

## 2016-02-17 DIAGNOSIS — E1129 Type 2 diabetes mellitus with other diabetic kidney complication: Secondary | ICD-10-CM | POA: Diagnosis not present

## 2016-02-17 DIAGNOSIS — D509 Iron deficiency anemia, unspecified: Secondary | ICD-10-CM | POA: Diagnosis not present

## 2016-02-19 ENCOUNTER — Encounter: Payer: Self-pay | Admitting: Family Medicine

## 2016-02-19 DIAGNOSIS — E1129 Type 2 diabetes mellitus with other diabetic kidney complication: Secondary | ICD-10-CM | POA: Diagnosis not present

## 2016-02-19 DIAGNOSIS — N2581 Secondary hyperparathyroidism of renal origin: Secondary | ICD-10-CM | POA: Diagnosis not present

## 2016-02-19 DIAGNOSIS — D509 Iron deficiency anemia, unspecified: Secondary | ICD-10-CM | POA: Diagnosis not present

## 2016-02-19 DIAGNOSIS — N186 End stage renal disease: Secondary | ICD-10-CM | POA: Diagnosis not present

## 2016-02-20 DIAGNOSIS — Z992 Dependence on renal dialysis: Secondary | ICD-10-CM | POA: Diagnosis not present

## 2016-02-20 DIAGNOSIS — I129 Hypertensive chronic kidney disease with stage 1 through stage 4 chronic kidney disease, or unspecified chronic kidney disease: Secondary | ICD-10-CM | POA: Diagnosis not present

## 2016-02-20 DIAGNOSIS — N186 End stage renal disease: Secondary | ICD-10-CM | POA: Diagnosis not present

## 2016-02-21 DIAGNOSIS — E1129 Type 2 diabetes mellitus with other diabetic kidney complication: Secondary | ICD-10-CM | POA: Diagnosis not present

## 2016-02-21 DIAGNOSIS — N186 End stage renal disease: Secondary | ICD-10-CM | POA: Diagnosis not present

## 2016-02-21 DIAGNOSIS — D631 Anemia in chronic kidney disease: Secondary | ICD-10-CM | POA: Diagnosis not present

## 2016-02-21 DIAGNOSIS — A4902 Methicillin resistant Staphylococcus aureus infection, unspecified site: Secondary | ICD-10-CM | POA: Diagnosis not present

## 2016-02-21 DIAGNOSIS — D509 Iron deficiency anemia, unspecified: Secondary | ICD-10-CM | POA: Diagnosis not present

## 2016-02-21 DIAGNOSIS — T8249XA Other complication of vascular dialysis catheter, initial encounter: Secondary | ICD-10-CM | POA: Diagnosis not present

## 2016-02-21 DIAGNOSIS — N2581 Secondary hyperparathyroidism of renal origin: Secondary | ICD-10-CM | POA: Diagnosis not present

## 2016-02-24 DIAGNOSIS — D509 Iron deficiency anemia, unspecified: Secondary | ICD-10-CM | POA: Diagnosis not present

## 2016-02-24 DIAGNOSIS — E1129 Type 2 diabetes mellitus with other diabetic kidney complication: Secondary | ICD-10-CM | POA: Diagnosis not present

## 2016-02-24 DIAGNOSIS — T8249XA Other complication of vascular dialysis catheter, initial encounter: Secondary | ICD-10-CM | POA: Diagnosis not present

## 2016-02-24 DIAGNOSIS — A4902 Methicillin resistant Staphylococcus aureus infection, unspecified site: Secondary | ICD-10-CM | POA: Diagnosis not present

## 2016-02-24 DIAGNOSIS — N2581 Secondary hyperparathyroidism of renal origin: Secondary | ICD-10-CM | POA: Diagnosis not present

## 2016-02-24 DIAGNOSIS — N186 End stage renal disease: Secondary | ICD-10-CM | POA: Diagnosis not present

## 2016-02-26 DIAGNOSIS — A4902 Methicillin resistant Staphylococcus aureus infection, unspecified site: Secondary | ICD-10-CM | POA: Diagnosis not present

## 2016-02-26 DIAGNOSIS — N2581 Secondary hyperparathyroidism of renal origin: Secondary | ICD-10-CM | POA: Diagnosis not present

## 2016-02-26 DIAGNOSIS — D509 Iron deficiency anemia, unspecified: Secondary | ICD-10-CM | POA: Diagnosis not present

## 2016-02-26 DIAGNOSIS — E1129 Type 2 diabetes mellitus with other diabetic kidney complication: Secondary | ICD-10-CM | POA: Diagnosis not present

## 2016-02-26 DIAGNOSIS — T8249XA Other complication of vascular dialysis catheter, initial encounter: Secondary | ICD-10-CM | POA: Diagnosis not present

## 2016-02-26 DIAGNOSIS — N186 End stage renal disease: Secondary | ICD-10-CM | POA: Diagnosis not present

## 2016-02-28 DIAGNOSIS — N2581 Secondary hyperparathyroidism of renal origin: Secondary | ICD-10-CM | POA: Diagnosis not present

## 2016-02-28 DIAGNOSIS — T8249XA Other complication of vascular dialysis catheter, initial encounter: Secondary | ICD-10-CM | POA: Diagnosis not present

## 2016-02-28 DIAGNOSIS — D509 Iron deficiency anemia, unspecified: Secondary | ICD-10-CM | POA: Diagnosis not present

## 2016-02-28 DIAGNOSIS — N186 End stage renal disease: Secondary | ICD-10-CM | POA: Diagnosis not present

## 2016-02-28 DIAGNOSIS — A4902 Methicillin resistant Staphylococcus aureus infection, unspecified site: Secondary | ICD-10-CM | POA: Diagnosis not present

## 2016-02-28 DIAGNOSIS — E1129 Type 2 diabetes mellitus with other diabetic kidney complication: Secondary | ICD-10-CM | POA: Diagnosis not present

## 2016-03-01 ENCOUNTER — Encounter (HOSPITAL_COMMUNITY): Payer: Self-pay | Admitting: Emergency Medicine

## 2016-03-01 ENCOUNTER — Inpatient Hospital Stay (HOSPITAL_COMMUNITY)
Admission: EM | Admit: 2016-03-01 | Discharge: 2016-03-06 | DRG: 252 | Disposition: A | Discharge status: Home / Self Care | Payer: Medicare Other | Attending: Internal Medicine | Admitting: Internal Medicine

## 2016-03-01 DIAGNOSIS — Z419 Encounter for procedure for purposes other than remedying health state, unspecified: Secondary | ICD-10-CM

## 2016-03-01 DIAGNOSIS — M898X9 Other specified disorders of bone, unspecified site: Secondary | ICD-10-CM | POA: Diagnosis present

## 2016-03-01 DIAGNOSIS — I120 Hypertensive chronic kidney disease with stage 5 chronic kidney disease or end stage renal disease: Secondary | ICD-10-CM | POA: Diagnosis present

## 2016-03-01 DIAGNOSIS — R571 Hypovolemic shock: Secondary | ICD-10-CM | POA: Diagnosis not present

## 2016-03-01 DIAGNOSIS — R578 Other shock: Secondary | ICD-10-CM | POA: Diagnosis present

## 2016-03-01 DIAGNOSIS — J45909 Unspecified asthma, uncomplicated: Secondary | ICD-10-CM | POA: Diagnosis present

## 2016-03-01 DIAGNOSIS — L0390 Cellulitis, unspecified: Secondary | ICD-10-CM | POA: Diagnosis present

## 2016-03-01 DIAGNOSIS — Z992 Dependence on renal dialysis: Secondary | ICD-10-CM

## 2016-03-01 DIAGNOSIS — D631 Anemia in chronic kidney disease: Secondary | ICD-10-CM | POA: Diagnosis present

## 2016-03-01 DIAGNOSIS — E875 Hyperkalemia: Secondary | ICD-10-CM | POA: Diagnosis present

## 2016-03-01 DIAGNOSIS — E1122 Type 2 diabetes mellitus with diabetic chronic kidney disease: Secondary | ICD-10-CM | POA: Diagnosis not present

## 2016-03-01 DIAGNOSIS — Z452 Encounter for adjustment and management of vascular access device: Secondary | ICD-10-CM

## 2016-03-01 DIAGNOSIS — N189 Chronic kidney disease, unspecified: Secondary | ICD-10-CM

## 2016-03-01 DIAGNOSIS — Z880 Allergy status to penicillin: Secondary | ICD-10-CM

## 2016-03-01 DIAGNOSIS — Z8249 Family history of ischemic heart disease and other diseases of the circulatory system: Secondary | ICD-10-CM

## 2016-03-01 DIAGNOSIS — R7881 Bacteremia: Secondary | ICD-10-CM | POA: Diagnosis present

## 2016-03-01 DIAGNOSIS — Z87442 Personal history of urinary calculi: Secondary | ICD-10-CM

## 2016-03-01 DIAGNOSIS — N186 End stage renal disease: Secondary | ICD-10-CM | POA: Diagnosis present

## 2016-03-01 DIAGNOSIS — E7800 Pure hypercholesterolemia, unspecified: Secondary | ICD-10-CM | POA: Diagnosis present

## 2016-03-01 DIAGNOSIS — N2581 Secondary hyperparathyroidism of renal origin: Secondary | ICD-10-CM | POA: Diagnosis present

## 2016-03-01 DIAGNOSIS — M79601 Pain in right arm: Secondary | ICD-10-CM | POA: Diagnosis not present

## 2016-03-01 DIAGNOSIS — T827XXA Infection and inflammatory reaction due to other cardiac and vascular devices, implants and grafts, initial encounter: Secondary | ICD-10-CM | POA: Diagnosis not present

## 2016-03-01 DIAGNOSIS — E785 Hyperlipidemia, unspecified: Secondary | ICD-10-CM | POA: Diagnosis present

## 2016-03-01 DIAGNOSIS — B9561 Methicillin susceptible Staphylococcus aureus infection as the cause of diseases classified elsewhere: Secondary | ICD-10-CM | POA: Diagnosis present

## 2016-03-01 DIAGNOSIS — Z888 Allergy status to other drugs, medicaments and biological substances status: Secondary | ICD-10-CM

## 2016-03-01 DIAGNOSIS — M109 Gout, unspecified: Secondary | ICD-10-CM | POA: Diagnosis present

## 2016-03-01 DIAGNOSIS — G4733 Obstructive sleep apnea (adult) (pediatric): Secondary | ICD-10-CM | POA: Diagnosis present

## 2016-03-01 DIAGNOSIS — M25529 Pain in unspecified elbow: Secondary | ICD-10-CM | POA: Diagnosis not present

## 2016-03-01 DIAGNOSIS — B958 Unspecified staphylococcus as the cause of diseases classified elsewhere: Secondary | ICD-10-CM | POA: Diagnosis present

## 2016-03-01 DIAGNOSIS — R222 Localized swelling, mass and lump, trunk: Secondary | ICD-10-CM | POA: Diagnosis not present

## 2016-03-01 DIAGNOSIS — D696 Thrombocytopenia, unspecified: Secondary | ICD-10-CM | POA: Diagnosis present

## 2016-03-01 DIAGNOSIS — Z886 Allergy status to analgesic agent status: Secondary | ICD-10-CM

## 2016-03-01 DIAGNOSIS — Z803 Family history of malignant neoplasm of breast: Secondary | ICD-10-CM

## 2016-03-01 DIAGNOSIS — D62 Acute posthemorrhagic anemia: Secondary | ICD-10-CM | POA: Diagnosis not present

## 2016-03-01 DIAGNOSIS — M069 Rheumatoid arthritis, unspecified: Secondary | ICD-10-CM | POA: Diagnosis present

## 2016-03-01 DIAGNOSIS — K219 Gastro-esophageal reflux disease without esophagitis: Secondary | ICD-10-CM | POA: Diagnosis present

## 2016-03-01 HISTORY — DX: Sleep apnea, unspecified: G47.30

## 2016-03-01 NOTE — ED Triage Notes (Signed)
Pt reports inflammation and swelling at right side fistula site. Pt reports the pain started before Friday (12/8) but the inflammation and swelling started after Fri dialysis tx. Pt A&Ox4. Reports some dizziness. Has dialysis in the am - MWF. 99.1F oral temperature. Hx of blood clots every 2.5 months.

## 2016-03-01 NOTE — ED Provider Notes (Signed)
By signing my name below, I, Rachael George, attest that this documentation has been prepared under the direction and in the presence of physician practitioner, Delice Bison Daina Cara, DO. Electronically Signed: Dora George, Scribe. 03/02/2016. 12:19 AM.  TIME SEEN: 12:19 AM  CHIEF COMPLAINT: Right Arm Pain  HPI: HPI Comments: Rachael George is a 60 y.o. female with PMHx significant for ESRD on HD MWF who presents to the Emergency Department complaining of constant pain at the fistula site of her right upper extremity beginning a few days ago. She reports associated redness and swelling. Pt states her fistula was placed by vascular surgery here in October 2016. She notes she gets blood clots at her fistula site on a regular basis and has experienced similar symptoms in the past. She denies h/o anticoagulant use. She denies h/o infection at her fistula site. She was last dialyzed 3 days ago; she attends dialysis every Monday, Wednesday, and Friday. No recent injury or trauma to her right upper extremity. She denies numbness, fever, chills, nausea, vomiting, diarrhea, CP, SOB, or any other associated symptoms.   ROS: See HPI Constitutional: no fever  Eyes: no drainage  ENT: no runny nose   Cardiovascular:  no chest pain  Resp: no SOB  GI: no vomiting GU: no dysuria Integumentary: no rash  Allergy: no hives  Musculoskeletal: no leg swelling  Neurological: no slurred speech ROS otherwise negative  PAST MEDICAL HISTORY/PAST SURGICAL HISTORY:  Past Medical History:  Diagnosis Date  . Anemia   . Asthma   . GERD (gastroesophageal reflux disease)   . Gout   . Heavy menstrual bleeding   . High cholesterol   . History of blood transfusion   . Hyperlipemia 12/07/2012  . Hypertension   . Kidney disease   . Kidney stones   . Prediabetes   . RA (rheumatoid arthritis) ([place])   . Sleep apnea     MEDICATIONS:  Prior to Admission medications   Medication Sig Start Date End Date Taking?  Authorizing Provider  albuterol (PROAIR HFA) 108 (90 Base) MCG/ACT inhaler Inhale 2 puffs into the lungs every 6 (six) hours as needed for wheezing. 04/30/15   Lance Bosch, NP  allopurinol (ZYLOPRIM) 100 MG tablet Take 100 mg by mouth 2 (two) times daily. Reported on 09/02/2015    Historical Provider, MD  atorvastatin (LIPITOR) 80 MG tablet Take 1 tablet (80 mg total) by mouth daily. 09/02/15   Arnoldo Morale, MD  cinacalcet (SENSIPAR) 30 MG tablet Take 30 mg by mouth daily.    Historical Provider, MD  colchicine 0.6 MG tablet Take 2 tabs (1.2mg ) at the onset of a gout flare, may repeat 1 tab (0.6mg ) if symptoms persist 09/02/15   Arnoldo Morale, MD  minoxidil (LONITEN) 2.5 MG tablet Take 2.5 mg by mouth 2 (two) times daily. Reported on 09/02/2015    Historical Provider, MD  oxyCODONE-acetaminophen (ROXICET) 5-325 MG tablet Take 1-2 tablets by mouth every 4 (four) hours as needed for severe pain. 02/07/15   Alvia Grove, PA-C  promethazine (PHENERGAN) 25 MG tablet Take 25 mg by mouth every 6 (six) hours as needed for nausea or vomiting.    Historical Provider, MD  sevelamer (RENAGEL) 800 MG tablet Take 800 mg by mouth 4 (four) times daily.    Historical Provider, MD    ALLERGIES:  Allergies  Allergen Reactions  . Atacand [Candesartan] Anaphylaxis  . Lisinopril Anaphylaxis  . Motrin [Ibuprofen] Anaphylaxis  . Nsaids Anaphylaxis  . Tolmetin Anaphylaxis  .  Penicillins Rash    Has patient had a PCN reaction causing immediate rash, facial/tongue/throat swelling, SOB or lightheadedness with hypotension: No Has patient had a PCN reaction causing severe rash involving mucus membranes or skin necrosis: No Has patient had a PCN reaction that required hospitalization No Has patient had a PCN reaction occurring within the last 10 years: No If all of the above answers are "NO", then may proceed with Cephalosporin use.     SOCIAL HISTORY:  Social History  Substance Use Topics  . Smoking status: Never  Smoker  . Smokeless tobacco: Never Used  . Alcohol use 0.0 oz/week     Comment: occ - 1 drink rarely    FAMILY HISTORY: Family History  Problem Relation Age of Onset  . Hypertension Mother   . Breast cancer Maternal Aunt   . Deep vein thrombosis Daughter   . Hyperlipidemia Daughter   . Hypertension Daughter   . Prostate cancer Maternal Grandmother     EXAM: BP 144/78   Pulse 98   Temp 99.3 F (37.4 C) (Oral)   Resp 23   Ht 5\' 2"  (1.575 m)   Wt 172 lb (78 kg)   SpO2 99%   BMI 31.46 kg/m  CONSTITUTIONAL: Alert and oriented and responds appropriately to questions. Appears uncomfortable, tearful, afebrile HEAD: Normocephalic EYES: Conjunctivae clear, PERRL, EOMI ENT: normal nose; no rhinorrhea; moist mucous membranes NECK: Supple, no meningismus, no nuchal rigidity, no LAD  CARD: RRR; S1 and S2 appreciated; no murmurs, no clicks, no rubs, no gallops RESP: Normal chest excursion without splinting or tachypnea; breath sounds clear and equal bilaterally; no wheezes, no rhonchi, no rales, no hypoxia or respiratory distress, speaking full sentences ABD/GI: Normal bowel sounds; non-distended; soft, non-tender, no rebound, no guarding, no peritoneal signs, no hepatosplenomegaly BACK:  The back appears normal and is non-tender to palpation, there is no CVA tenderness EXT: Normal ROM in all joints; non-tender to palpation; no edema; normal capillary refill; no cyanosis, no calf tenderness or swelling SKIN: Normal color for age and race; warm; no rash;  large area of erythema, warmth and induration to the right upper inner arm surrounding the fistula, good thrill, 2+ radial pulse, no fluctuance or drainage, no bleeding NEURO: Moves all extremities equally, sensation to light touch intact diffusely, cranial nerves II through XII intact, normal speech PSYCH: The patient's mood and manner are appropriate. Grooming and personal hygiene are appropriate.  MEDICAL DECISION MAKING: Patient here  with what appears to be an infected fistula. It has good thrill and a strong radial pulse distally. No sign of fluctuance on exam. No systemic symptoms. She does appear very uncomfortable. We'll give IV Dilaudid, IV vancomycin. Will obtain labs, cultures. Plan is for admission. PCP is with [facility].  Last dialyzed 2 days ago. Due for dialysis in the morning. Is not hypertensive and does not appear volume overloaded.  ED PROGRESS: 2:10 AM  Pt's labs showed no leukocytosis, normal lactate. She does have mildly elevated potassium 5.5. Will check an EKG. We'll discuss with medicine for admission of infected graft.   2:15 AM  Discussed patient's case with hospitalist, Dr. Hal Hope.  Recommend admission to inpatient, telemetry bed.  I will place holding orders per their request. Patient and family (if present) updated with plan. Care transferred to hospitalist service.  I reviewed all nursing notes, vitals, pertinent old records, EKGs, labs, imaging (as available).   EKG Interpretation  Date/Time:  Monday March 02 2016 02:28:06 EST Ventricular  Rate:  111 PR Interval:    QRS Duration: 76 QT Interval:  323 QTC Calculation: 439 R Axis:   52 Text Interpretation:  Sinus tachycardia Right atrial enlargement Anteroseptal infarct, age indeterminate Confirmed by Margaretta Chittum,  DO, Kylil Swopes ([id number]) on 03/02/2016 2:31:24 AM        I personally performed the services described in this documentation, which was scribed in my presence. The recorded information has been reviewed and is accurate.    [place], DO 03/02/16 [phone]

## 2016-03-02 ENCOUNTER — Encounter (HOSPITAL_COMMUNITY): Payer: Self-pay | Admitting: Internal Medicine

## 2016-03-02 DIAGNOSIS — T827XXA Infection and inflammatory reaction due to other cardiac and vascular devices, implants and grafts, initial encounter: Principal | ICD-10-CM

## 2016-03-02 DIAGNOSIS — I120 Hypertensive chronic kidney disease with stage 5 chronic kidney disease or end stage renal disease: Secondary | ICD-10-CM | POA: Diagnosis not present

## 2016-03-02 DIAGNOSIS — E785 Hyperlipidemia, unspecified: Secondary | ICD-10-CM | POA: Diagnosis present

## 2016-03-02 DIAGNOSIS — R571 Hypovolemic shock: Secondary | ICD-10-CM | POA: Diagnosis not present

## 2016-03-02 DIAGNOSIS — M109 Gout, unspecified: Secondary | ICD-10-CM | POA: Diagnosis present

## 2016-03-02 DIAGNOSIS — Z8349 Family history of other endocrine, nutritional and metabolic diseases: Secondary | ICD-10-CM

## 2016-03-02 DIAGNOSIS — K219 Gastro-esophageal reflux disease without esophagitis: Secondary | ICD-10-CM | POA: Diagnosis present

## 2016-03-02 DIAGNOSIS — T82898A Other specified complication of vascular prosthetic devices, implants and grafts, initial encounter: Secondary | ICD-10-CM | POA: Diagnosis not present

## 2016-03-02 DIAGNOSIS — Z880 Allergy status to penicillin: Secondary | ICD-10-CM

## 2016-03-02 DIAGNOSIS — R7881 Bacteremia: Secondary | ICD-10-CM | POA: Diagnosis not present

## 2016-03-02 DIAGNOSIS — Z886 Allergy status to analgesic agent status: Secondary | ICD-10-CM

## 2016-03-02 DIAGNOSIS — Y712 Prosthetic and other implants, materials and accessory cardiovascular devices associated with adverse incidents: Secondary | ICD-10-CM

## 2016-03-02 DIAGNOSIS — R578 Other shock: Secondary | ICD-10-CM | POA: Diagnosis not present

## 2016-03-02 DIAGNOSIS — Z8249 Family history of ischemic heart disease and other diseases of the circulatory system: Secondary | ICD-10-CM

## 2016-03-02 DIAGNOSIS — D62 Acute posthemorrhagic anemia: Secondary | ICD-10-CM | POA: Diagnosis not present

## 2016-03-02 DIAGNOSIS — Z8042 Family history of malignant neoplasm of prostate: Secondary | ICD-10-CM

## 2016-03-02 DIAGNOSIS — B957 Other staphylococcus as the cause of diseases classified elsewhere: Secondary | ICD-10-CM | POA: Diagnosis not present

## 2016-03-02 DIAGNOSIS — D631 Anemia in chronic kidney disease: Secondary | ICD-10-CM | POA: Diagnosis not present

## 2016-03-02 DIAGNOSIS — J45909 Unspecified asthma, uncomplicated: Secondary | ICD-10-CM | POA: Diagnosis present

## 2016-03-02 DIAGNOSIS — G4733 Obstructive sleep apnea (adult) (pediatric): Secondary | ICD-10-CM | POA: Diagnosis present

## 2016-03-02 DIAGNOSIS — Z452 Encounter for adjustment and management of vascular access device: Secondary | ICD-10-CM | POA: Diagnosis not present

## 2016-03-02 DIAGNOSIS — Z888 Allergy status to other drugs, medicaments and biological substances status: Secondary | ICD-10-CM

## 2016-03-02 DIAGNOSIS — T827XXD Infection and inflammatory reaction due to other cardiac and vascular devices, implants and grafts, subsequent encounter: Secondary | ICD-10-CM | POA: Diagnosis not present

## 2016-03-02 DIAGNOSIS — E1122 Type 2 diabetes mellitus with diabetic chronic kidney disease: Secondary | ICD-10-CM | POA: Diagnosis not present

## 2016-03-02 DIAGNOSIS — Z992 Dependence on renal dialysis: Secondary | ICD-10-CM | POA: Diagnosis not present

## 2016-03-02 DIAGNOSIS — E7800 Pure hypercholesterolemia, unspecified: Secondary | ICD-10-CM | POA: Diagnosis present

## 2016-03-02 DIAGNOSIS — N185 Chronic kidney disease, stage 5: Secondary | ICD-10-CM | POA: Diagnosis not present

## 2016-03-02 DIAGNOSIS — D696 Thrombocytopenia, unspecified: Secondary | ICD-10-CM | POA: Diagnosis not present

## 2016-03-02 DIAGNOSIS — T82838A Hemorrhage of vascular prosthetic devices, implants and grafts, initial encounter: Secondary | ICD-10-CM | POA: Diagnosis not present

## 2016-03-02 DIAGNOSIS — T8119XA Other postprocedural shock, initial encounter: Secondary | ICD-10-CM | POA: Diagnosis not present

## 2016-03-02 DIAGNOSIS — E119 Type 2 diabetes mellitus without complications: Secondary | ICD-10-CM | POA: Diagnosis not present

## 2016-03-02 DIAGNOSIS — M069 Rheumatoid arthritis, unspecified: Secondary | ICD-10-CM | POA: Diagnosis present

## 2016-03-02 DIAGNOSIS — B958 Unspecified staphylococcus as the cause of diseases classified elsewhere: Secondary | ICD-10-CM | POA: Diagnosis present

## 2016-03-02 DIAGNOSIS — M79601 Pain in right arm: Secondary | ICD-10-CM | POA: Diagnosis not present

## 2016-03-02 DIAGNOSIS — N2581 Secondary hyperparathyroidism of renal origin: Secondary | ICD-10-CM | POA: Diagnosis not present

## 2016-03-02 DIAGNOSIS — N186 End stage renal disease: Secondary | ICD-10-CM | POA: Diagnosis not present

## 2016-03-02 DIAGNOSIS — L0390 Cellulitis, unspecified: Secondary | ICD-10-CM | POA: Diagnosis not present

## 2016-03-02 DIAGNOSIS — B9561 Methicillin susceptible Staphylococcus aureus infection as the cause of diseases classified elsewhere: Secondary | ICD-10-CM | POA: Diagnosis not present

## 2016-03-02 DIAGNOSIS — Z803 Family history of malignant neoplasm of breast: Secondary | ICD-10-CM

## 2016-03-02 DIAGNOSIS — E875 Hyperkalemia: Secondary | ICD-10-CM | POA: Diagnosis not present

## 2016-03-02 LAB — CBC
HCT: 36.2 % (ref 36.0–46.0)
Hemoglobin: 11.7 g/dL — ABNORMAL LOW (ref 12.0–15.0)
MCH: 26.1 pg (ref 26.0–34.0)
MCHC: 32.3 g/dL (ref 30.0–36.0)
MCV: 80.8 fL (ref 78.0–100.0)
Platelets: 104 10*3/uL — ABNORMAL LOW (ref 150–400)
RBC: 4.48 MIL/uL (ref 3.87–5.11)
RDW: 16.8 % — ABNORMAL HIGH (ref 11.5–15.5)
WBC: 7.3 10*3/uL (ref 4.0–10.5)

## 2016-03-02 LAB — CBC WITH DIFFERENTIAL/PLATELET
Basophils Absolute: 0 10*3/uL (ref 0.0–0.1)
Basophils Relative: 0 %
Eosinophils Absolute: 0 10*3/uL (ref 0.0–0.7)
Eosinophils Relative: 0 %
HCT: 35.4 % — ABNORMAL LOW (ref 36.0–46.0)
Hemoglobin: 11.6 g/dL — ABNORMAL LOW (ref 12.0–15.0)
Lymphocytes Relative: 10 %
Lymphs Abs: 0.8 10*3/uL (ref 0.7–4.0)
MCH: 26.4 pg (ref 26.0–34.0)
MCHC: 32.8 g/dL (ref 30.0–36.0)
MCV: 80.6 fL (ref 78.0–100.0)
Monocytes Absolute: 0.4 10*3/uL (ref 0.1–1.0)
Monocytes Relative: 5 %
Neutro Abs: 7 10*3/uL (ref 1.7–7.7)
Neutrophils Relative %: 85 %
Platelets: 121 10*3/uL — ABNORMAL LOW (ref 150–400)
RBC: 4.39 MIL/uL (ref 3.87–5.11)
RDW: 16.6 % — ABNORMAL HIGH (ref 11.5–15.5)
WBC: 8.3 10*3/uL (ref 4.0–10.5)

## 2016-03-02 LAB — BASIC METABOLIC PANEL
Anion gap: 15 (ref 5–15)
Anion gap: 15 (ref 5–15)
BUN: 68 mg/dL — ABNORMAL HIGH (ref 6–20)
BUN: 70 mg/dL — ABNORMAL HIGH (ref 6–20)
CO2: 21 mmol/L — ABNORMAL LOW (ref 22–32)
CO2: 20 mmol/L — ABNORMAL LOW (ref 22–32)
Calcium: 9.1 mg/dL (ref 8.9–10.3)
Calcium: 8.9 mg/dL (ref 8.9–10.3)
Chloride: 100 mmol/L — ABNORMAL LOW (ref 101–111)
Chloride: 99 mmol/L — ABNORMAL LOW (ref 101–111)
Creatinine, Ser: 10.03 mg/dL — ABNORMAL HIGH (ref 0.44–1.00)
Creatinine, Ser: 10.54 mg/dL — ABNORMAL HIGH (ref 0.44–1.00)
GFR calc Af Amer: 4 mL/min — ABNORMAL LOW (ref >60)
GFR calc Af Amer: 4 mL/min — ABNORMAL LOW (ref >60)
GFR calc non Af Amer: 4 mL/min — ABNORMAL LOW (ref >60)
GFR calc non Af Amer: 4 mL/min — ABNORMAL LOW (ref >60)
Glucose, Bld: 100 mg/dL — ABNORMAL HIGH (ref 65–99)
Glucose, Bld: 103 mg/dL — ABNORMAL HIGH (ref 65–99)
Potassium: 5.5 mmol/L — ABNORMAL HIGH (ref 3.5–5.1)
Potassium: 6 mmol/L — ABNORMAL HIGH (ref 3.5–5.1)
Sodium: 136 mmol/L (ref 135–145)
Sodium: 134 mmol/L — ABNORMAL LOW (ref 135–145)

## 2016-03-02 LAB — BLOOD CULTURE ID PANEL (REFLEXED)

## 2016-03-02 LAB — MRSA PCR SCREENING: MRSA by PCR: POSITIVE — AB

## 2016-03-02 LAB — I-STAT CG4 LACTIC ACID, ED: Lactic Acid, Venous: 1.77 mmol/L (ref 0.5–1.9)

## 2016-03-02 MED ORDER — DEXTROSE 5 % IV SOLN
1.0000 g | INTRAVENOUS | Status: DC
  Filled 2016-03-02: qty 1

## 2016-03-02 MED ORDER — FAMOTIDINE 20 MG PO TABS
20.0000 mg | ORAL_TABLET | Freq: Every day | ORAL | Status: DC
  Administered 2016-03-03 – 2016-03-05 (×3): 20 mg via ORAL
  Filled 2016-03-02 (×3): qty 1

## 2016-03-02 MED ORDER — CINACALCET HCL 30 MG PO TABS
30.0000 mg | ORAL_TABLET | Freq: Every day | ORAL | Status: DC
  Administered 2016-03-02 – 2016-03-05 (×4): 30 mg via ORAL
  Filled 2016-03-02 (×6): qty 1

## 2016-03-02 MED ORDER — VANCOMYCIN HCL IN DEXTROSE 1-5 GM/200ML-% IV SOLN: 1000.0000 mg | INTRAVENOUS | Status: DC

## 2016-03-02 MED ORDER — ACETAMINOPHEN 325 MG PO TABS
650.0000 mg | ORAL_TABLET | Freq: Once | ORAL | Status: AC
  Administered 2016-03-02: 650 mg via ORAL
  Filled 2016-03-02: qty 2

## 2016-03-02 MED ORDER — HEPARIN SODIUM (PORCINE) 5000 UNIT/ML IJ SOLN
5000.0000 [IU] | Freq: Three times a day (TID) | INTRAMUSCULAR | Status: DC
  Administered 2016-03-03: 5000 [IU] via SUBCUTANEOUS
  Filled 2016-03-02 (×2): qty 1

## 2016-03-02 MED ORDER — ACETAMINOPHEN 325 MG PO TABS
650.0000 mg | ORAL_TABLET | Freq: Four times a day (QID) | ORAL | Status: DC | PRN
  Administered 2016-03-02 – 2016-03-05 (×4): 650 mg via ORAL
  Filled 2016-03-02 (×4): qty 2

## 2016-03-02 MED ORDER — PANTOPRAZOLE SODIUM 40 MG PO TBEC
40.0000 mg | DELAYED_RELEASE_TABLET | Freq: Two times a day (BID) | ORAL | Status: DC
  Administered 2016-03-02 – 2016-03-06 (×9): 40 mg via ORAL
  Filled 2016-03-02: qty 2
  Filled 2016-03-02 (×8): qty 1

## 2016-03-02 MED ORDER — ONDANSETRON HCL 4 MG/2ML IJ SOLN
4.0000 mg | Freq: Four times a day (QID) | INTRAMUSCULAR | Status: DC | PRN
  Administered 2016-03-02: 4 mg via INTRAVENOUS
  Filled 2016-03-02: qty 2

## 2016-03-02 MED ORDER — CALCIUM CARBONATE-VITAMIN D 500-200 MG-UNIT PO TABS
4.0000 | ORAL_TABLET | ORAL | Status: DC
  Filled 2016-03-02: qty 4

## 2016-03-02 MED ORDER — OXYCODONE HCL 5 MG PO TABS: 5.0000 mg | ORAL_TABLET | Freq: Four times a day (QID) | ORAL | Status: DC | PRN

## 2016-03-02 MED ORDER — MUPIROCIN 2 % EX OINT
1.0000 "application " | TOPICAL_OINTMENT | Freq: Two times a day (BID) | CUTANEOUS | Status: DC
  Administered 2016-03-02 – 2016-03-06 (×9): 1 via NASAL
  Filled 2016-03-02 (×5): qty 22

## 2016-03-02 MED ORDER — SEVELAMER CARBONATE 800 MG PO TABS
1600.0000 mg | ORAL_TABLET | Freq: Three times a day (TID) | ORAL | Status: DC
Start: 2016-03-02 — End: 2016-03-06
  Administered 2016-03-02 – 2016-03-06 (×9): 1600 mg via ORAL
  Filled 2016-03-02 (×9): qty 2

## 2016-03-02 MED ORDER — HYDROMORPHONE HCL 2 MG/ML IJ SOLN
1.0000 mg | Freq: Once | INTRAMUSCULAR | Status: AC
  Administered 2016-03-02: 1 mg via INTRAVENOUS
  Filled 2016-03-02: qty 1

## 2016-03-02 MED ORDER — SODIUM POLYSTYRENE SULFONATE 15 GM/60ML PO SUSP
30.0000 g | Freq: Once | ORAL | Status: AC
  Administered 2016-03-02: 30 g via ORAL
  Filled 2016-03-02 (×3): qty 120

## 2016-03-02 MED ORDER — VANCOMYCIN HCL IN DEXTROSE 1-5 GM/200ML-% IV SOLN
1000.0000 mg | Freq: Once | INTRAVENOUS | Status: AC
  Administered 2016-03-02: 1000 mg via INTRAVENOUS
  Filled 2016-03-02: qty 200

## 2016-03-02 MED ORDER — ALLOPURINOL 100 MG PO TABS
100.0000 mg | ORAL_TABLET | Freq: Two times a day (BID) | ORAL | Status: DC
  Administered 2016-03-02 – 2016-03-06 (×9): 100 mg via ORAL
  Filled 2016-03-02 (×9): qty 1

## 2016-03-02 MED ORDER — MORPHINE SULFATE (PF) 2 MG/ML IV SOLN
1.0000 mg | INTRAVENOUS | Status: DC | PRN
  Administered 2016-03-02 – 2016-03-04 (×4): 1 mg via INTRAVENOUS
  Filled 2016-03-02 (×4): qty 1

## 2016-03-02 MED ORDER — FAMOTIDINE 20 MG PO TABS
20.0000 mg | ORAL_TABLET | Freq: Two times a day (BID) | ORAL | Status: DC
  Administered 2016-03-02: 20 mg via ORAL
  Filled 2016-03-02: qty 1

## 2016-03-02 MED ORDER — ACETAMINOPHEN 650 MG RE SUPP: 650.0000 mg | Freq: Four times a day (QID) | RECTAL | Status: DC | PRN

## 2016-03-02 MED ORDER — ATORVASTATIN CALCIUM 80 MG PO TABS
80.0000 mg | ORAL_TABLET | Freq: Every day | ORAL | Status: DC
  Administered 2016-03-02 – 2016-03-06 (×5): 80 mg via ORAL
  Filled 2016-03-02 (×6): qty 1

## 2016-03-02 MED ORDER — VANCOMYCIN HCL IN DEXTROSE 750-5 MG/150ML-% IV SOLN
750.0000 mg | Freq: Once | INTRAVENOUS | Status: AC
  Administered 2016-03-02: 750 mg via INTRAVENOUS
  Filled 2016-03-02: qty 150

## 2016-03-02 MED ORDER — CHLORHEXIDINE GLUCONATE CLOTH 2 % EX PADS
6.0000 | MEDICATED_PAD | Freq: Every day | CUTANEOUS | Status: AC
Start: 2016-03-02 — End: 2016-03-06
  Administered 2016-03-02 – 2016-03-06 (×5): 6 via TOPICAL

## 2016-03-02 MED ORDER — ONDANSETRON HCL 4 MG PO TABS
4.0000 mg | ORAL_TABLET | Freq: Four times a day (QID) | ORAL | Status: DC | PRN
  Administered 2016-03-05: 4 mg via ORAL
  Filled 2016-03-02: qty 1

## 2016-03-02 MED ORDER — DEXTROSE 5 % IV SOLN
1.0000 g | Freq: Once | INTRAVENOUS | Status: AC
  Administered 2016-03-02: 1 g via INTRAVENOUS
  Filled 2016-03-02: qty 1

## 2016-03-02 MED ORDER — VANCOMYCIN HCL IN DEXTROSE 750-5 MG/150ML-% IV SOLN
750.0000 mg | INTRAVENOUS | Status: DC
  Filled 2016-03-02 (×3): qty 150

## 2016-03-02 MED ORDER — ACETAMINOPHEN 500 MG PO TABS
1000.0000 mg | ORAL_TABLET | Freq: Once | ORAL | Status: AC
  Administered 2016-03-02: 1000 mg via ORAL
  Filled 2016-03-02: qty 2

## 2016-03-02 MED ORDER — ALBUTEROL SULFATE (2.5 MG/3ML) 0.083% IN NEBU: 2.5000 mg | INHALATION_SOLUTION | Freq: Four times a day (QID) | RESPIRATORY_TRACT | Status: DC | PRN

## 2016-03-02 MED ORDER — RENA-VITE PO TABS
1.0000 | ORAL_TABLET | Freq: Every day | ORAL | Status: DC
  Administered 2016-03-02 – 2016-03-05 (×4): 1 via ORAL
  Filled 2016-03-02 (×4): qty 1

## 2016-03-02 NOTE — Progress Notes (Signed)
CRITICAL VALUE ALERT  Critical value received:  Positive MRSA PCR  Date of notification:  03/02/16  Time of notification:  11:33  Critical value read back: yes  Nurse who received alert:  Cassell Smiles   MD notified (1st page):  Ghimire  Time of first page:  11:37  MD notified (2nd page):  Time of second page:  Responding MD:    Time MD responded:

## 2016-03-02 NOTE — Progress Notes (Signed)
Pharmacy Antibiotic Note  Rachael George is a 60 y.o. female admitted on 03/01/2016 with pain/swelling and possible infection at RUE AV fistula site.  Pharmacy has been consulted for  Vancomycin and Cefepime dosing.  Vancomycin 1 g IV given in ED at  [facility]: Additional Vancomycin 750 mg IV IV now for total of 1750 mg, then 750 mg IV after each HD Cefepime 1 g IV q24h  Height: 5\' 2"  (157.5 cm) Weight: 185 lb 3 oz (84 kg) IBW/kg (Calculated) : 50.1  Temp (24hrs), Avg:100.6 F (38.1 C), Min:99.3 F (37.4 C), Max:101.9 F (38.8 C)   Recent Labs Lab 03/02/16 0105 03/02/16 0116  WBC 8.3  --   CREATININE 10.03*  --   LATICACIDVEN  --  1.77    Estimated Creatinine Clearance: 6.1 mL/min (by C-G formula based on SCr of 10.03 mg/dL (H)).    Allergies  Allergen Reactions  . Atacand [Candesartan] Anaphylaxis  . Lisinopril Anaphylaxis  . Motrin [Ibuprofen] Anaphylaxis  . Nsaids Anaphylaxis  . Tolmetin Anaphylaxis  . Penicillins Rash    Has patient had a PCN reaction causing immediate rash, facial/tongue/throat swelling, SOB or lightheadedness with hypotension: No Has patient had a PCN reaction causing severe rash involving mucus membranes or skin necrosis: No Has patient had a PCN reaction that required hospitalization No Has patient had a PCN reaction occurring within the last 10 years: No If all of the above answers are "NO", then may proceed with Cephalosporin use.      Caryl Pina 03/02/2016 4:09 AM

## 2016-03-02 NOTE — H&P (Signed)
History and Physical    [name] FAO:[id number] DOB: 10-04-1955 DOA: 03/01/2016  PCP: Arnoldo Morale, MD  Patient coming from: Home.  Chief Complaint: Right arm swelling and pain.  HPI: Rachael George is a 60 y.o. female with ESRD on hemodialysis on Monday Wednesday and Friday presents to the ER because of worsening pain and swelling of the right arm over the right AV fistula. Patient states the swelling started last week which has gradually worsened. Yesterday became more painful. On exam patient has swelling of the right forearm around the AV fistula area with warmth and induration. Patient is being admitted for possible AV fistula infection.   ED Course: Blood cultures were obtained and empiric antibiotics started.  Review of Systems: As per HPI, rest all negative.   Past Medical History:  Diagnosis Date  . Anemia   . Asthma   . GERD (gastroesophageal reflux disease)   . Gout   . Heavy menstrual bleeding   . High cholesterol   . History of blood transfusion   . Hyperlipemia 12/07/2012  . Hypertension   . Kidney disease   . Kidney stones   . Prediabetes   . RA (rheumatoid arthritis) ([place])   . Sleep apnea     Past Surgical History:  Procedure Laterality Date  . ABDOMINAL HYSTERECTOMY  2000  . BASCILIC VEIN TRANSPOSITION Right 12/06/2014   Procedure: FIRST STAGE BASILIC VEIN TRANSPOSITION - RIGHT;  Surgeon: Serafina Mitchell, MD;  Location: [facility];  Service: Vascular;  Laterality: Right;  . BASCILIC VEIN TRANSPOSITION Right 02/07/2015   Procedure: RIGHT ARM SECOND STAGE BASILIC VEIN TRANSPOSITION;  Surgeon: Serafina Mitchell, MD;  Location: [facility];  Service: Vascular;  Laterality: Right;  . ECTOPIC PREGNANCY SURGERY  02/1982  . I&D EXTREMITY Right 02/07/2015   Procedure: IRRIGATION AND DEBRIDEMENT RIGHT ARM HEMATOMA;  Surgeon: Serafina Mitchell, MD;  Location: [facility] OR;  Service: Vascular;  Laterality: Right;  . left shoulder surgery  08/2005  . TUBAL LIGATION  11/1986      reports that she has never smoked. She has never used smokeless tobacco. She reports that she drinks alcohol. She reports that she does not use drugs.  Allergies  Allergen Reactions  . Atacand [Candesartan] Anaphylaxis  . Lisinopril Anaphylaxis  . Motrin [Ibuprofen] Anaphylaxis  . Nsaids Anaphylaxis  . Tolmetin Anaphylaxis  . Penicillins Rash    Has patient had a PCN reaction causing immediate rash, facial/tongue/throat swelling, SOB or lightheadedness with hypotension: No Has patient had a PCN reaction causing severe rash involving mucus membranes or skin necrosis: No Has patient had a PCN reaction that required hospitalization No Has patient had a PCN reaction occurring within the last 10 years: No If all of the above answers are "NO", then may proceed with Cephalosporin use.     Family History  Problem Relation Age of Onset  . Hypertension Mother   . Breast cancer Maternal Aunt   . Deep vein thrombosis Daughter   . Hyperlipidemia Daughter   . Hypertension Daughter   . Prostate cancer Maternal Grandmother     Prior to Admission medications   Medication Sig Start Date End Date Taking? Authorizing Provider  albuterol (PROAIR HFA) 108 (90 Base) MCG/ACT inhaler Inhale 2 puffs into the lungs every 6 (six) hours as needed for wheezing. 04/30/15  Yes Lance Bosch, NP  allopurinol (ZYLOPRIM) 100 MG tablet Take 100 mg by mouth 2 (two) times daily. Reported on 09/02/2015   Yes Historical  Provider, MD  B Complex-C-Folic Acid (DIALYVITE TABLET) TABS Take 1 tablet by mouth daily.   Yes Historical Provider, MD  Calcium Carbonate-Vitamin D (CALCIUM-VITAMIN D) 500-200 MG-UNIT tablet Take 4 tablets by mouth every Monday, Wednesday, and Friday. AT DIALYSIS   Yes Historical Provider, MD  cinacalcet (SENSIPAR) 30 MG tablet Take 30 mg by mouth daily.   Yes Historical Provider, MD  colchicine 0.6 MG tablet Take 2 tabs (1.2mg ) at the onset of a gout flare, may repeat 1 tab (0.6mg ) if symptoms  persist 09/02/15  Yes Arnoldo Morale, MD  famotidine (PEPCID) 20 MG tablet Take 20 mg by mouth 2 (two) times daily.   Yes Historical Provider, MD  pantoprazole (PROTONIX) 40 MG tablet Take 40 mg by mouth 2 (two) times daily.   Yes Historical Provider, MD  sevelamer (RENAGEL) 800 MG tablet Take 1,600-3,200 mg by mouth See admin instructions. 3,200 mg three times a day with meals and 1,600 mg twice a day with snacks   Yes Historical Provider, MD  atorvastatin (LIPITOR) 80 MG tablet Take 1 tablet (80 mg total) by mouth daily. Patient not taking: Reported on 03/02/2016 09/02/15   Arnoldo Morale, MD  oxyCODONE-acetaminophen (ROXICET) 5-325 MG tablet Take 1-2 tablets by mouth every 4 (four) hours as needed for severe pain. Patient not taking: Reported on 03/02/2016 02/07/15   Alvia Grove, PA-C    Physical Exam: Vitals:   03/02/16 0000 03/02/16 0100 03/02/16 0302 03/02/16 0323  BP: 144/78 148/76 145/74 (!) 152/69  Pulse: 98 105 103 (!) 102  Resp: 23 (!) 27 20 19   Temp:    (!) 101.9 F (38.8 C)  TempSrc:    Oral  SpO2: 99% 100% 100% 98%  Weight:    84 kg (185 lb 3 oz)  Height:    5\' 2"  (1.575 m)      Constitutional: Moderately built and nourished. Vitals:   03/02/16 0000 03/02/16 0100 03/02/16 0302 03/02/16 0323  BP: 144/78 148/76 145/74 (!) 152/69  Pulse: 98 105 103 (!) 102  Resp: 23 (!) 27 20 19   Temp:    (!) 101.9 F (38.8 C)  TempSrc:    Oral  SpO2: 99% 100% 100% 98%  Weight:    84 kg (185 lb 3 oz)  Height:    5\' 2"  (1.575 m)   Eyes: Anicteric no pallor. ENMT: No discharge from the ears eyes nose and mouth. Neck: No mass felt no JVD appreciated. Respiratory: No rhonchi or crepitations. Cardiovascular: S1 and S2 heard. Abdomen: Soft nontender bowel sounds present. Musculoskeletal: Right arm is swollen and tender to touch indurated. Skin: Skin appears erythematous on the right arm. Neurologic: Alert awake oriented to time place and person. Moves all extremities. Psychiatric:  Appears normal. Normal affect.   Labs on Admission: I have personally reviewed following labs and imaging studies  CBC:  Recent Labs Lab 03/02/16 0105  WBC 8.3  NEUTROABS 7.0  HGB 11.6*  HCT 35.4*  MCV 80.6  PLT 094*   Basic Metabolic Panel:  Recent Labs Lab 03/02/16 0105  NA 136  K 5.5*  CL 100*  CO2 21*  GLUCOSE 100*  BUN 68*  CREATININE 10.03*  CALCIUM 9.1   GFR: Estimated Creatinine Clearance: 6.1 mL/min (by C-G formula based on SCr of 10.03 mg/dL (H)). Liver Function Tests: No results for input(s): AST, ALT, ALKPHOS, BILITOT, PROT, ALBUMIN in the last 168 hours. No results for input(s): LIPASE, AMYLASE in the last 168 hours. No results for input(s): AMMONIA  in the last 168 hours. Coagulation Profile: No results for input(s): INR, PROTIME in the last 168 hours. Cardiac Enzymes: No results for input(s): CKTOTAL, CKMB, CKMBINDEX, TROPONINI in the last 168 hours. BNP (last 3 results) No results for input(s): PROBNP in the last 8760 hours. HbA1C: No results for input(s): HGBA1C in the last 72 hours. CBG: No results for input(s): GLUCAP in the last 168 hours. Lipid Profile: No results for input(s): CHOL, HDL, LDLCALC, TRIG, CHOLHDL, LDLDIRECT in the last 72 hours. Thyroid Function Tests: No results for input(s): TSH, T4TOTAL, FREET4, T3FREE, THYROIDAB in the last 72 hours. Anemia Panel: No results for input(s): VITAMINB12, FOLATE, FERRITIN, TIBC, IRON, RETICCTPCT in the last 72 hours. Urine analysis:    Component Value Date/Time   BILIRUBINUR neg 12/25/2013 1746   PROTEINUR >=300 12/25/2013 1746   UROBILINOGEN 0.2 12/25/2013 1746   NITRITE neg 12/25/2013 1746   LEUKOCYTESUR Negative 12/25/2013 1746   Sepsis Labs: @LABRCNTIP (procalcitonin:4,lacticidven:4) )No results found for this or any previous visit (from the past 240 hour(s)).   Radiological Exams on Admission: No results found.  Assessment/Plan Principal Problem:   Infection of arteriovenous  fistula (HCC) Active Problems:   End stage renal disease (HCC)   Anemia in chronic kidney disease    1. Right forearm swelling most likely from infected AV fistula - I have discussed with Dr. Trula Slade, vascular surgeon, who will be seeing patient in consult. For now I have placed patient nothing by mouth. Continue antibiotics and follow blood cultures. 2. ESRD on hemodialysis on Monday Wednesday and Friday - please consult nephrology for dialysis patient does not appear to be in fluid overload at this time. 3. Chronic anemia - probably from ESRD and follow CBC. 4. Thrombocytopenia - follow CBC. 5. History of hypertension - presently being managed without medications. Closely observe. 6. History of diabetes mellitus on diet.   DVT prophylaxis: SCDs. Code Status: Full code.  Family Communication: Discussed with patient.  Disposition Plan: Home.  Consults called: Dr. Trula Slade. Vascular surgeon.  Admission status: Inpatient.    Rise Patience MD [facility] Pager [phone].  If 7PM-7AM, please contact night-coverage [email or web address] Password [email or web address]  03/02/2016, 4:02 AM

## 2016-03-02 NOTE — Progress Notes (Signed)
Temp 103.  MD notified.

## 2016-03-02 NOTE — Progress Notes (Signed)
  PHARMACY - PHYSICIAN COMMUNICATION CRITICAL VALUE ALERT - BLOOD CULTURE IDENTIFICATION (BCID)  Results for orders placed or performed during the hospital encounter of 03/01/16  Blood Culture ID Panel (Reflexed) (Collected: 03/02/2016  1:10 AM)  Result Value Ref Range   Enterococcus species NOT DETECTED NOT DETECTED   Listeria monocytogenes NOT DETECTED NOT DETECTED   Staphylococcus species DETECTED (A) NOT DETECTED   Staphylococcus aureus DETECTED (A) NOT DETECTED   Methicillin resistance DETECTED (A) NOT DETECTED   Streptococcus species NOT DETECTED NOT DETECTED   Streptococcus agalactiae NOT DETECTED NOT DETECTED   Streptococcus pneumoniae NOT DETECTED NOT DETECTED   Streptococcus pyogenes NOT DETECTED NOT DETECTED   Acinetobacter baumannii NOT DETECTED NOT DETECTED   Enterobacteriaceae species NOT DETECTED NOT DETECTED   Enterobacter cloacae complex NOT DETECTED NOT DETECTED   Escherichia coli NOT DETECTED NOT DETECTED   Klebsiella oxytoca NOT DETECTED NOT DETECTED   Klebsiella pneumoniae NOT DETECTED NOT DETECTED   Proteus species NOT DETECTED NOT DETECTED   Serratia marcescens NOT DETECTED NOT DETECTED   Haemophilus influenzae NOT DETECTED NOT DETECTED   Neisseria meningitidis NOT DETECTED NOT DETECTED   Pseudomonas aeruginosa NOT DETECTED NOT DETECTED   Candida albicans NOT DETECTED NOT DETECTED   Candida glabrata NOT DETECTED NOT DETECTED   Candida krusei NOT DETECTED NOT DETECTED   Candida parapsilosis NOT DETECTED NOT DETECTED   Candida tropicalis NOT DETECTED NOT DETECTED    Name of physician (or Provider) Contacted: None  Changes to prescribed antibiotics required: Currently being treated with vancomycin - dose given early this am and no HD since.  Vancomycin post each HD ordered.    Bonnita Nasuti Pharm.D. CPP, BCPS Clinical Pharmacist [phone] 03/02/2016 4:43 PM

## 2016-03-02 NOTE — Consult Note (Signed)
[name]  Date of Admission:  03/01/2016  Date of Consult:  03/02/2016  Reason for Consult: Staph bacteremia Referring Physician: CHAMP  Impression/Recommendation Staph bacteremia Would stop cefepime Continue vancomycin Defer to vascular regarding further w/u, need for imaging, debridement. The finding of pus and her recurrent fever suggest she could need I & D.  Temporary HD line.  Repeat BCx TEE Check HIV   Thank you so much for this interesting consult,   Rachael George (pager) [phone] [email or web address]  Rachael George is an 60 y.o. female.  HPI: 60 yo F with hx of DM2, RA, OSA, ESRD on txp list, and R basilic vein fistula creation on 12-06-14 and 62-26-33 complicated by bleeding, hematoma, and return, repair. She recovered and was able to get HD through her site.  She states that periodically she has had decreasing flow in her fistula and had to have it opened up.  She comes to ED on 12-11 with swelIling and pain in her fistula site over the last week. She denies f/c.  In ED her temp was 101.9, WBC 8.3, and was felt to have cellulitis over her fistula.  She was started on vanco/cefepime and has had vascular f/u. In HD, her fistula drained pus when cannulated in hospital. Wound Cx sent.   Past Medical History:  Diagnosis Date  . Anemia   . Asthma   . GERD (gastroesophageal reflux disease)   . Gout   . Heavy menstrual bleeding   . High cholesterol   . History of blood transfusion   . Hyperlipemia 12/07/2012  . Hypertension   . Kidney disease   . Kidney stones   . Prediabetes   . RA (rheumatoid arthritis) ([place])   . Sleep apnea     Past Surgical History:  Procedure Laterality Date  . ABDOMINAL HYSTERECTOMY  2000  . BASCILIC VEIN TRANSPOSITION Right 12/06/2014   Procedure: FIRST STAGE BASILIC VEIN TRANSPOSITION - RIGHT;  Surgeon: Serafina Mitchell, MD;  Location: [facility];  Service: Vascular;  Laterality: Right;  .  BASCILIC VEIN TRANSPOSITION Right 02/07/2015   Procedure: RIGHT ARM SECOND STAGE BASILIC VEIN TRANSPOSITION;  Surgeon: Serafina Mitchell, MD;  Location: [facility];  Service: Vascular;  Laterality: Right;  . ECTOPIC PREGNANCY SURGERY  02/1982  . I&D EXTREMITY Right 02/07/2015   Procedure: IRRIGATION AND DEBRIDEMENT RIGHT ARM HEMATOMA;  Surgeon: Serafina Mitchell, MD;  Location: [facility] OR;  Service: Vascular;  Laterality: Right;  . left shoulder surgery  08/2005  . TUBAL LIGATION  11/1986     Allergies  Allergen Reactions  . Atacand [Candesartan] Anaphylaxis  . Lisinopril Anaphylaxis  . Motrin [Ibuprofen] Anaphylaxis  . Nsaids Anaphylaxis  . Tolmetin Anaphylaxis  . Penicillins Rash    Has patient had a PCN reaction causing immediate rash, facial/tongue/throat swelling, SOB or lightheadedness with hypotension: No Has patient had a PCN reaction causing severe rash involving mucus membranes or skin necrosis: No Has patient had a PCN reaction that required hospitalization No Has patient had a PCN reaction occurring within the last 10 years: No If all of the above answers are "NO", then may proceed with Cephalosporin use.     Medications:  Scheduled: . allopurinol  100 mg Oral BID  . atorvastatin  80 mg Oral Daily  . calcium-vitamin D  4 tablet Oral Q M,W,F-HD  . ceFEPime (MAXIPIME) IV  1 g Intravenous Q24H  . Chlorhexidine Gluconate Cloth  6 each Topical Q0600  .  cinacalcet  30 mg Oral Q breakfast  . [START ON 03/03/2016] famotidine  20 mg Oral QHS  . heparin subcutaneous  5,000 Units Subcutaneous Q8H  . multivitamin  1 tablet Oral QHS  . mupirocin ointment  1 application Nasal BID  . pantoprazole  40 mg Oral BID  . sevelamer carbonate  1,600 mg Oral TID WC  . vancomycin  750 mg Intravenous Q M,W,F-HD    Abtx:  Anti-infectives    Start     Dose/Rate Route Frequency Ordered Stop   03/03/16 0600  vancomycin (VANCOCIN) IVPB 1000 mg/200 mL premix  Status:  Discontinued     1,000 mg 200 mL/hr  over 60 Minutes Intravenous On call to O.R. 03/02/16 1554 03/02/16 1602   03/02/16 2200  ceFEPIme (MAXIPIME) 1 g in dextrose 5 % 50 mL IVPB     1 g 100 mL/hr over 30 Minutes Intravenous Every 24 hours 03/02/16 0415     03/02/16 1200  vancomycin (VANCOCIN) IVPB 750 mg/150 ml premix     750 mg 150 mL/hr over 60 Minutes Intravenous Every M-W-F (Hemodialysis) 03/02/16 0415     03/02/16 0430  vancomycin (VANCOCIN) IVPB 750 mg/150 ml premix     750 mg 150 mL/hr over 60 Minutes Intravenous  Once 03/02/16 0415 03/02/16 0621   03/02/16 0430  ceFEPIme (MAXIPIME) 1 g in dextrose 5 % 50 mL IVPB     1 g 100 mL/hr over 30 Minutes Intravenous  Once 03/02/16 0415 03/02/16 0549   03/02/16 0045  vancomycin (VANCOCIN) IVPB 1000 mg/200 mL premix     1,000 mg 200 mL/hr over 60 Minutes Intravenous  Once 03/02/16 0032 03/02/16 0319      Total days of antibiotics: 1 vanco/cefepime          Social History:  reports that she has never smoked. She has never used smokeless tobacco. She reports that she drinks alcohol. She reports that she does not use drugs.  Family History  Problem Relation Age of Onset  . Hypertension Mother   . Breast cancer Maternal Aunt   . Deep vein thrombosis Daughter   . Hyperlipidemia Daughter   . Hypertension Daughter   . Prostate cancer Maternal Grandmother     General ROS: deines F/C, no urine, prev normal BM, no change in wt, normal apetite, floaters in vision. see HPI.   Blood pressure 125/65, pulse (!) 117, temperature (!) 103.1 F (39.5 C), temperature source Oral, resp. rate 18, height 5' 2"  (1.575 m), weight 84 kg (185 lb 3 oz), SpO2 100 %. General appearance: alert, cooperative and no distress Eyes: negative findings: conjunctivae and sclerae normal and pupils equal, round, reactive to light and accomodation Throat: lips, mucosa, and tongue normal; teeth and gums normal Neck: no adenopathy and supple, symmetrical, trachea midline Lungs: clear to auscultation  bilaterally Heart: regular rate and rhythm Abdomen: normal findings: bowel sounds normal and soft, non-tender Extremities: none in BLE. RUE AVF is grossly swollen, erythematous. Warm.    Results for orders placed or performed during the hospital encounter of 03/01/16 (from the past 48 hour(s))  CBC with Differential     Status: Abnormal   Collection Time: 03/02/16  1:05 AM  Result Value Ref Range   WBC 8.3 4.0 - 10.5 K/uL   RBC 4.39 3.87 - 5.11 MIL/uL   Hemoglobin 11.6 (L) 12.0 - 15.0 g/dL   HCT 35.4 (L) 36.0 - 46.0 %   MCV 80.6 78.0 - 100.0 fL   MCH 26.4  26.0 - 34.0 pg   MCHC 32.8 30.0 - 36.0 g/dL   RDW 16.6 (H) 11.5 - 15.5 %   Platelets 121 (L) 150 - 400 K/uL   Neutrophils Relative % 85 %   Neutro Abs 7.0 1.7 - 7.7 K/uL   Lymphocytes Relative 10 %   Lymphs Abs 0.8 0.7 - 4.0 K/uL   Monocytes Relative 5 %   Monocytes Absolute 0.4 0.1 - 1.0 K/uL   Eosinophils Relative 0 %   Eosinophils Absolute 0.0 0.0 - 0.7 K/uL   Basophils Relative 0 %   Basophils Absolute 0.0 0.0 - 0.1 K/uL  Basic metabolic panel     Status: Abnormal   Collection Time: 03/02/16  1:05 AM  Result Value Ref Range   Sodium 136 135 - 145 mmol/L   Potassium 5.5 (H) 3.5 - 5.1 mmol/L   Chloride 100 (L) 101 - 111 mmol/L   CO2 21 (L) 22 - 32 mmol/L   Glucose, Bld 100 (H) 65 - 99 mg/dL   BUN 68 (H) 6 - 20 mg/dL   Creatinine, Ser 10.03 (H) 0.44 - 1.00 mg/dL   Calcium 9.1 8.9 - 10.3 mg/dL   GFR calc non Af Amer 4 (L) >60 mL/min   GFR calc Af Amer 4 (L) >60 mL/min    Comment: (NOTE) The eGFR has been calculated using the CKD EPI equation. This calculation has not been validated in all clinical situations. eGFR's persistently <60 mL/min signify possible Chronic Kidney Disease.    Anion gap 15 5 - 15  Culture, blood (Routine X 2) w Reflex to ID Panel     Status: None (Preliminary result)   Collection Time: 03/02/16  1:10 AM  Result Value Ref Range   Specimen Description BLOOD LEFT ARM    Special Requests IN  PEDIATRIC BOTTLE 4CC    Culture  Setup Time      GRAM POSITIVE COCCI IN CLUSTERS IN PEDIATRIC BOTTLE Organism ID to follow CRITICAL RESULT CALLED TO, READ BACK BY AND VERIFIED WITH: LPablo Lawrence.D. 16:10 03/02/16 ([email or web address])    Culture PENDING    Report Status PENDING   Blood Culture ID Panel (Reflexed)     Status: Abnormal   Collection Time: 03/02/16  1:10 AM  Result Value Ref Range   Enterococcus species NOT DETECTED NOT DETECTED   Listeria monocytogenes NOT DETECTED NOT DETECTED   Staphylococcus species DETECTED (A) NOT DETECTED    Comment: CRITICAL RESULT CALLED TO, READ BACK BY AND VERIFIED WITH: LPablo Lawrence.D. 16:10 03/02/16 (wilsonm)    Staphylococcus aureus DETECTED (A) NOT DETECTED    Comment: CRITICAL RESULT CALLED TO, READ BACK BY AND VERIFIED WITH: LPablo Lawrence.D. 16:10 03/02/16 (wilsonm)    Methicillin resistance DETECTED (A) NOT DETECTED    Comment: CRITICAL RESULT CALLED TO, READ BACK BY AND VERIFIED WITH: LPablo Lawrence.D. 16:10 03/02/16 (wilsonm)    Streptococcus species NOT DETECTED NOT DETECTED   Streptococcus agalactiae NOT DETECTED NOT DETECTED   Streptococcus pneumoniae NOT DETECTED NOT DETECTED   Streptococcus pyogenes NOT DETECTED NOT DETECTED   Acinetobacter baumannii NOT DETECTED NOT DETECTED   Enterobacteriaceae species NOT DETECTED NOT DETECTED   Enterobacter cloacae complex NOT DETECTED NOT DETECTED   Escherichia coli NOT DETECTED NOT DETECTED   Klebsiella oxytoca NOT DETECTED NOT DETECTED   Klebsiella pneumoniae NOT DETECTED NOT DETECTED   Proteus species NOT DETECTED NOT DETECTED   Serratia marcescens NOT DETECTED NOT DETECTED   Haemophilus influenzae NOT DETECTED NOT DETECTED  Neisseria meningitidis NOT DETECTED NOT DETECTED   Pseudomonas aeruginosa NOT DETECTED NOT DETECTED   Candida albicans NOT DETECTED NOT DETECTED   Candida glabrata NOT DETECTED NOT DETECTED   Candida krusei NOT DETECTED NOT DETECTED   Candida parapsilosis NOT  DETECTED NOT DETECTED   Candida tropicalis NOT DETECTED NOT DETECTED  I-Stat CG4 Lactic Acid, ED     Status: None   Collection Time: 03/02/16  1:16 AM  Result Value Ref Range   Lactic Acid, Venous 1.77 0.5 - 1.9 mmol/L  MRSA PCR Screening     Status: Abnormal   Collection Time: 03/02/16  8:34 AM  Result Value Ref Range   MRSA by PCR POSITIVE (A) NEGATIVE    Comment:        The GeneXpert MRSA Assay (FDA approved for NASAL specimens only), is one component of a comprehensive MRSA colonization surveillance program. It is not intended to diagnose MRSA infection nor to guide or monitor treatment for MRSA infections. RESULT CALLED TO, READ BACK BY AND VERIFIED WITH: R. Greenwalt RN 11:35 03/02/16 ([email or web address])   Basic metabolic panel     Status: Abnormal   Collection Time: 03/02/16  8:36 AM  Result Value Ref Range   Sodium 134 (L) 135 - 145 mmol/L   Potassium 6.0 (H) 3.5 - 5.1 mmol/L   Chloride 99 (L) 101 - 111 mmol/L   CO2 20 (L) 22 - 32 mmol/L   Glucose, Bld 103 (H) 65 - 99 mg/dL   BUN 70 (H) 6 - 20 mg/dL   Creatinine, Ser 10.54 (H) 0.44 - 1.00 mg/dL   Calcium 8.9 8.9 - 10.3 mg/dL   GFR calc non Af Amer 4 (L) >60 mL/min   GFR calc Af Amer 4 (L) >60 mL/min    Comment: (NOTE) The eGFR has been calculated using the CKD EPI equation. This calculation has not been validated in all clinical situations. eGFR's persistently <60 mL/min signify possible Chronic Kidney Disease.    Anion gap 15 5 - 15  CBC     Status: Abnormal   Collection Time: 03/02/16  8:36 AM  Result Value Ref Range   WBC 7.3 4.0 - 10.5 K/uL   RBC 4.48 3.87 - 5.11 MIL/uL   Hemoglobin 11.7 (L) 12.0 - 15.0 g/dL   HCT 36.2 36.0 - 46.0 %   MCV 80.8 78.0 - 100.0 fL   MCH 26.1 26.0 - 34.0 pg   MCHC 32.3 30.0 - 36.0 g/dL   RDW 16.8 (H) 11.5 - 15.5 %   Platelets 104 (L) 150 - 400 K/uL    Comment: REPEATED TO VERIFY PLATELET COUNT CONFIRMED BY SMEAR   Aerobic Culture (superficial specimen)     Status: None  (Preliminary result)   Collection Time: 03/02/16  2:10 PM  Result Value Ref Range   Specimen Description WOUND    Special Requests AVF    Gram Stain NO WBC SEEN NO ORGANISMS SEEN     Culture PENDING    Report Status PENDING       Component Value Date/Time   SDES WOUND 03/02/2016 1410   SPECREQUEST AVF 03/02/2016 1410   CULT PENDING 03/02/2016 1410   REPTSTATUS PENDING 03/02/2016 1410   No results found. Recent Results (from the past 240 hour(s))  Culture, blood (Routine X 2) w Reflex to ID Panel     Status: None (Preliminary result)   Collection Time: 03/02/16  1:10 AM  Result Value Ref Range Status   Specimen Description BLOOD LEFT ARM  Final   Special Requests IN PEDIATRIC BOTTLE 4CC  Final   Culture  Setup Time   Final    GRAM POSITIVE COCCI IN CLUSTERS IN PEDIATRIC BOTTLE Organism ID to follow CRITICAL RESULT CALLED TO, READ BACK BY AND VERIFIED WITH: LPablo Lawrence.D. 16:10 03/02/16 ([email or web address])    Culture PENDING  Incomplete   Report Status PENDING  Incomplete  Blood Culture ID Panel (Reflexed)     Status: Abnormal   Collection Time: 03/02/16  1:10 AM  Result Value Ref Range Status   Enterococcus species NOT DETECTED NOT DETECTED Final   Listeria monocytogenes NOT DETECTED NOT DETECTED Final   Staphylococcus species DETECTED (A) NOT DETECTED Final    Comment: CRITICAL RESULT CALLED TO, READ BACK BY AND VERIFIED WITH: LPablo Lawrence.D. 16:10 03/02/16 (wilsonm)    Staphylococcus aureus DETECTED (A) NOT DETECTED Final    Comment: CRITICAL RESULT CALLED TO, READ BACK BY AND VERIFIED WITH: LPablo Lawrence.D. 16:10 03/02/16 (wilsonm)    Methicillin resistance DETECTED (A) NOT DETECTED Final    Comment: CRITICAL RESULT CALLED TO, READ BACK BY AND VERIFIED WITH: LPablo Lawrence.D. 16:10 03/02/16 (wilsonm)    Streptococcus species NOT DETECTED NOT DETECTED Final   Streptococcus agalactiae NOT DETECTED NOT DETECTED Final   Streptococcus pneumoniae NOT DETECTED NOT  DETECTED Final   Streptococcus pyogenes NOT DETECTED NOT DETECTED Final   Acinetobacter baumannii NOT DETECTED NOT DETECTED Final   Enterobacteriaceae species NOT DETECTED NOT DETECTED Final   Enterobacter cloacae complex NOT DETECTED NOT DETECTED Final   Escherichia coli NOT DETECTED NOT DETECTED Final   Klebsiella oxytoca NOT DETECTED NOT DETECTED Final   Klebsiella pneumoniae NOT DETECTED NOT DETECTED Final   Proteus species NOT DETECTED NOT DETECTED Final   Serratia marcescens NOT DETECTED NOT DETECTED Final   Haemophilus influenzae NOT DETECTED NOT DETECTED Final   Neisseria meningitidis NOT DETECTED NOT DETECTED Final   Pseudomonas aeruginosa NOT DETECTED NOT DETECTED Final   Candida albicans NOT DETECTED NOT DETECTED Final   Candida glabrata NOT DETECTED NOT DETECTED Final   Candida krusei NOT DETECTED NOT DETECTED Final   Candida parapsilosis NOT DETECTED NOT DETECTED Final   Candida tropicalis NOT DETECTED NOT DETECTED Final  MRSA PCR Screening     Status: Abnormal   Collection Time: 03/02/16  8:34 AM  Result Value Ref Range Status   MRSA by PCR POSITIVE (A) NEGATIVE Final    Comment:        The GeneXpert MRSA Assay (FDA approved for NASAL specimens only), is one component of a comprehensive MRSA colonization surveillance program. It is not intended to diagnose MRSA infection nor to guide or monitor treatment for MRSA infections. RESULT CALLED TO, READ BACK BY AND VERIFIED WITH: R. Greenwalt RN 11:35 03/02/16 ([email or web address])   Aerobic Culture (superficial specimen)     Status: None (Preliminary result)   Collection Time: 03/02/16  2:10 PM  Result Value Ref Range Status   Specimen Description WOUND  Final   Special Requests AVF  Final   Gram Stain NO WBC SEEN NO ORGANISMS SEEN   Final   Culture PENDING  Incomplete   Report Status PENDING  Incomplete      03/02/2016, 6:47 PM     LOS: 0 days    Records and images were personally reviewed where available.

## 2016-03-02 NOTE — Progress Notes (Signed)
Spoke with Juanell Fairly, NP.  Hold Vancomycin and Kayexalate until future notice.  She will notify this RN if and when to administer.

## 2016-03-02 NOTE — Progress Notes (Signed)
Spoke with Marlou Sa in HD.  Patient will be going to hemodialysis today.  Orders will be placed today.

## 2016-03-02 NOTE — Progress Notes (Signed)
PROGRESS NOTE        PATIENT DETAILS Name: Rachael George Age: 60 y.o. Sex: female Date of Birth: 08/26/1955 Admit Date: 03/01/2016 Admitting Physician Rise Patience, MD [id number], Jarold Song, MD  Brief Narrative: Patient is a 60 y.o. female with history of ESRD who presented with fever, pain and swelling of her right arm around her AV fistula site. Thought to have infection around her AV fistula site and admitted for empiric antibiotics. See below for further details   Subjective: Febrile this morning. Continues to have pain at the AV fistula site.  Assessment/Plan: Soft tissue infection around right arm AV fistula site: Continue empiric antibiotics-seen by vascular surgery-recommendations are to follow clinical course with IV antibiotics and reserve surgical options if patient fails to respond to antimicrobial therapy. Follow blood cultures  ESRD-on HD MWF: Nephrology consulted for dialysis care.  Hyperkalemia: 1 dose of Kayexalate, nephrology consulted for HD.  Anemia: Likely secondary to chronic kidney disease, follow-defer to nephrology  Thrombocytopenia: Mild-follow for now. This appears to be a chronic issue.  Dyslipidemia: Continue statin  Gout: No evidence of flare, continue allopurinol  DVT Prophylaxis: Prophylactic Heparin-watch platelets  Code Status: Full code   Family Communication: Spouse at bedside  Disposition Plan: Remain inpatient  Antimicrobial agents: See below  Procedures: None  CONSULTS:  nephrology and vascular surgery  Time spent: 25 minutes-Greater than 50% of this time was spent in counseling, explanation of diagnosis, planning of further management, and coordination of care.  MEDICATIONS: Anti-infectives    Start     Dose/Rate Route Frequency Ordered Stop   03/02/16 2200  ceFEPIme (MAXIPIME) 1 g in dextrose 5 % 50 mL IVPB     1 g 100 mL/hr over 30 Minutes Intravenous Every 24 hours 03/02/16 0415       03/02/16 1200  vancomycin (VANCOCIN) IVPB 750 mg/150 ml premix     750 mg 150 mL/hr over 60 Minutes Intravenous Every M-W-F (Hemodialysis) 03/02/16 0415     03/02/16 0430  vancomycin (VANCOCIN) IVPB 750 mg/150 ml premix     750 mg 150 mL/hr over 60 Minutes Intravenous  Once 03/02/16 0415 03/02/16 0621   03/02/16 0430  ceFEPIme (MAXIPIME) 1 g in dextrose 5 % 50 mL IVPB     1 g 100 mL/hr over 30 Minutes Intravenous  Once 03/02/16 0415 03/02/16 0549   03/02/16 0045  vancomycin (VANCOCIN) IVPB 1000 mg/200 mL premix     1,000 mg 200 mL/hr over 60 Minutes Intravenous  Once 03/02/16 0032 03/02/16 0319      Scheduled Meds: . allopurinol  100 mg Oral BID  . atorvastatin  80 mg Oral Daily  . calcium-vitamin D  4 tablet Oral Q M,W,F-HD  . ceFEPime (MAXIPIME) IV  1 g Intravenous Q24H  . Chlorhexidine Gluconate Cloth  6 each Topical Q0600  . cinacalcet  30 mg Oral Q breakfast  . famotidine  20 mg Oral BID  . multivitamin  1 tablet Oral QHS  . mupirocin ointment  1 application Nasal BID  . pantoprazole  40 mg Oral BID  . sevelamer carbonate  1,600 mg Oral TID WC  . vancomycin  750 mg Intravenous Q M,W,F-HD   Continuous Infusions: PRN Meds:.acetaminophen **OR** acetaminophen, albuterol, morphine injection, ondansetron **OR** ondansetron (ZOFRAN) IV, oxyCODONE   PHYSICAL EXAM: Vital signs: Vitals:   03/02/16 0302 03/02/16 0323  03/02/16 0913 03/02/16 1222  BP: 145/74 (!) 152/69 (!) 148/81   Pulse: 103 (!) 102 (!) 107   Resp: 20 19 20    Temp:  (!) 101.9 F (38.8 C) (!) 103 F (39.4 C) (!) 103 F (39.4 C)  TempSrc:  Oral Oral Oral  SpO2: 100% 98% 100%   Weight:  84 kg (185 lb 3 oz)    Height:  5\' 2"  (1.575 m)     Filed Weights   03/01/16 2343 03/02/16 0323  Weight: 78 kg (172 lb) 84 kg (185 lb 3 oz)   Body mass index is 33.87 kg/m.   General appearance :Awake, alert, not in any distress. Speech Clear. Not toxic Looking Eyes:, pupils equally reactive to light and  accomodation,no scleral icterus.Pink conjunctiva HEENT: Atraumatic and Normocephalic Neck: supple, no JVD. No cervical lymphadenopathy. No thyromegaly Resp:Good air entry bilaterally, no added sounds  CVS: S1 S2 regular, no murmurs.  GI: Bowel sounds present, Non tender and not distended with no gaurding, rigidity or rebound.No organomegaly Extremities: B/L Lower Ext shows no edema, both legs are warm to touch.Erythema around the fistula site in the right arm with some raised induration. Neurology:  speech clear,Non focal, sensation is grossly intact. Psychiatric: Normal judgment and insight. Alert and oriented x 3. Normal mood. Musculoskeletal:No digital cyanosis Skin:No Rash, warm and dry Wounds:N/A  I have personally reviewed following labs and imaging studies  LABORATORY DATA: CBC:  Recent Labs Lab 03/02/16 0105 03/02/16 0836  WBC 8.3 7.3  NEUTROABS 7.0  --   HGB 11.6* 11.7*  HCT 35.4* 36.2  MCV 80.6 80.8  PLT 121* 104*    Basic Metabolic Panel:  Recent Labs Lab 03/02/16 0105 03/02/16 0836  NA 136 134*  K 5.5* 6.0*  CL 100* 99*  CO2 21* 20*  GLUCOSE 100* 103*  BUN 68* 70*  CREATININE 10.03* 10.54*  CALCIUM 9.1 8.9    GFR: Estimated Creatinine Clearance: 5.8 mL/min (by C-G formula based on SCr of 10.54 mg/dL (H)).  Liver Function Tests: No results for input(s): AST, ALT, ALKPHOS, BILITOT, PROT, ALBUMIN in the last 168 hours. No results for input(s): LIPASE, AMYLASE in the last 168 hours. No results for input(s): AMMONIA in the last 168 hours.  Coagulation Profile: No results for input(s): INR, PROTIME in the last 168 hours.  Cardiac Enzymes: No results for input(s): CKTOTAL, CKMB, CKMBINDEX, TROPONINI in the last 168 hours.  BNP (last 3 results) No results for input(s): PROBNP in the last 8760 hours.  HbA1C: No results for input(s): HGBA1C in the last 72 hours.  CBG: No results for input(s): GLUCAP in the last 168 hours.  Lipid Profile: No  results for input(s): CHOL, HDL, LDLCALC, TRIG, CHOLHDL, LDLDIRECT in the last 72 hours.  Thyroid Function Tests: No results for input(s): TSH, T4TOTAL, FREET4, T3FREE, THYROIDAB in the last 72 hours.  Anemia Panel: No results for input(s): VITAMINB12, FOLATE, FERRITIN, TIBC, IRON, RETICCTPCT in the last 72 hours.  Urine analysis:    Component Value Date/Time   BILIRUBINUR neg 12/25/2013 1746   PROTEINUR >=300 12/25/2013 1746   UROBILINOGEN 0.2 12/25/2013 1746   NITRITE neg 12/25/2013 1746   LEUKOCYTESUR Negative 12/25/2013 1746    Sepsis Labs: Lactic Acid, Venous    Component Value Date/Time   LATICACIDVEN 1.77 03/02/2016 0116    MICROBIOLOGY: Recent Results (from the past 240 hour(s))  MRSA PCR Screening     Status: Abnormal   Collection Time: 03/02/16  8:34 AM  Result Value  Ref Range Status   MRSA by PCR POSITIVE (A) NEGATIVE Final    Comment:        The GeneXpert MRSA Assay (FDA approved for NASAL specimens only), is one component of a comprehensive MRSA colonization surveillance program. It is not intended to diagnose MRSA infection nor to guide or monitor treatment for MRSA infections. RESULT CALLED TO, READ BACK BY AND VERIFIED WITH: R. Greenwalt RN 11:35 03/02/16 ([email or web address])     RADIOLOGY STUDIES/RESULTS: No results found.   LOS: 0 days   Oren Binet, MD  [facility] Pager:[phone]  If 7PM-7AM, please contact night-coverage [email or web address] Password [email or web address] 03/02/2016, 12:41 PM

## 2016-03-02 NOTE — Progress Notes (Signed)
Per Hildred Alamin in pharmacy do not give vancomycin. It must be administered with hemodialysis.

## 2016-03-02 NOTE — Progress Notes (Signed)
[facility] Renal Consultation Note    Indication for Consultation:  Management of ESRD/hemodialysis; anemia, hypertension/volume and secondary hyperparathyroidism PCP:  HPI: Rachael George is a 60 y.o. female with ESRD 2/2 HTN on hemodialysis MWF at [facility]. PHM of HTN, DMT2, rheumatoid arthritis, gout, anemia of chronic disease, SHPT.   Patient says she was fine until Friday night when she developed redness/swelling and pain RUA AVF. Patient uses button holes in dialysis unit. She presented to ED this AM for evaluation. Temp on arrival was 99.3 WBC 8.3 All other labs were unremarkable. She was seen by Dr. Trula Slade who said area appeared to be cellulitis and recommended antibiotic therapy.    Later this AM, repeat BMET showed K+ 6.0. Temperature is up to 102.4 orally (has been as high as 103 on 6E). She was brought to hemodialysis on schedule. Upon arrival, patient had abscess type formation venous button hole. When RN cannulated arterial portion of AVF, area started draining pus. Wound cultures have been obtained, Dr. Trula Slade was notified. We are holding HD until patient is seen by [facility]. Orders are in place for Kayexalate, checking EKG.   Patient states she has pain, swelling and erythremia RUA AVF. States that temperature was 99.2 at home, denies fever, chills, malaise, N,V,D, abdominal pain, chest pain, SOB, DOE, no other symptoms. She attends HD at [facility] regularly, is compliant with hemodialysis and BMD medications. Patient states she has multiple issues with current AVF and has been to [facility] for interventions. Interventions listed below:  12/06/14 Placement 1st stage R basilic vein tranposition-Dr. Trula Slade 02/07/15 Elevation of R basicilic vein fistula/redo arterial anastomosis, branch ligation X 3-Dr. Trula Slade 02/07/15 Evacuation of R Arm Hematoma-Dr. Trula Slade 06/17/15 Thrombectomy without revision-Dr. Augustin Coupe 07/15/15 Fistulagram/Venous angioplasty-Dr. Posey Pronto 10/17/15  Fstulagram Dr. Augustin Coupe 12/26/15 Fistulagram/Venous angioplasty-Dr. Augustin Coupe.  Past Medical History:  Diagnosis Date  . Anemia   . Asthma   . GERD (gastroesophageal reflux disease)   . Gout   . Heavy menstrual bleeding   . High cholesterol   . History of blood transfusion   . Hyperlipemia 12/07/2012  . Hypertension   . Kidney disease   . Kidney stones   . Prediabetes   . RA (rheumatoid arthritis) ([place])   . Sleep apnea    Past Surgical History:  Procedure Laterality Date  . ABDOMINAL HYSTERECTOMY  2000  . BASCILIC VEIN TRANSPOSITION Right 12/06/2014   Procedure: FIRST STAGE BASILIC VEIN TRANSPOSITION - RIGHT;  Surgeon: Serafina Mitchell, MD;  Location: [place];  Service: Vascular;  Laterality: Right;  . BASCILIC VEIN TRANSPOSITION Right 02/07/2015   Procedure: RIGHT ARM SECOND STAGE BASILIC VEIN TRANSPOSITION;  Surgeon: Serafina Mitchell, MD;  Location: [place];  Service: Vascular;  Laterality: Right;  . ECTOPIC PREGNANCY SURGERY  02/1982  . I&D EXTREMITY Right 02/07/2015   Procedure: IRRIGATION AND DEBRIDEMENT RIGHT ARM HEMATOMA;  Surgeon: Serafina Mitchell, MD;  Location: [facility] OR;  Service: Vascular;  Laterality: Right;  . left shoulder surgery  08/2005  . TUBAL LIGATION  11/1986   Family History  Problem Relation Age of Onset  . Hypertension Mother   . Breast cancer Maternal Aunt   . Deep vein thrombosis Daughter   . Hyperlipidemia Daughter   . Hypertension Daughter   . Prostate cancer Maternal Grandmother    Social History:  reports that she has never smoked. She has never used smokeless tobacco. She reports that she drinks alcohol. She reports that she does not use drugs. Allergies  Allergen Reactions  . Atacand [Candesartan] Anaphylaxis  . Lisinopril Anaphylaxis  . Motrin [Ibuprofen] Anaphylaxis  . Nsaids Anaphylaxis  . Tolmetin Anaphylaxis  . Penicillins Rash    Has patient had a PCN reaction causing immediate rash, facial/tongue/throat swelling, SOB or lightheadedness with  hypotension: No Has patient had a PCN reaction causing severe rash involving mucus membranes or skin necrosis: No Has patient had a PCN reaction that required hospitalization No Has patient had a PCN reaction occurring within the last 10 years: No If all of the above answers are "NO", then may proceed with Cephalosporin use.    Prior to Admission medications   Medication Sig Start Date End Date Taking? Authorizing Provider  albuterol (PROAIR HFA) 108 (90 Base) MCG/ACT inhaler Inhale 2 puffs into the lungs every 6 (six) hours as needed for wheezing. 04/30/15  Yes Lance Bosch, NP  allopurinol (ZYLOPRIM) 100 MG tablet Take 100 mg by mouth 2 (two) times daily. Reported on 09/02/2015   Yes Historical Provider, MD  B Complex-C-Folic Acid (DIALYVITE TABLET) TABS Take 1 tablet by mouth daily.   Yes Historical Provider, MD  Calcium Carbonate-Vitamin D (CALCIUM-VITAMIN D) 500-200 MG-UNIT tablet Take 4 tablets by mouth every Monday, Wednesday, and Friday. AT DIALYSIS   Yes Historical Provider, MD  cinacalcet (SENSIPAR) 30 MG tablet Take 30 mg by mouth daily.   Yes Historical Provider, MD  colchicine 0.6 MG tablet Take 2 tabs (1.2mg ) at the onset of a gout flare, may repeat 1 tab (0.6mg ) if symptoms persist 09/02/15  Yes Arnoldo Morale, MD  famotidine (PEPCID) 20 MG tablet Take 20 mg by mouth 2 (two) times daily.   Yes Historical Provider, MD  pantoprazole (PROTONIX) 40 MG tablet Take 40 mg by mouth 2 (two) times daily.   Yes Historical Provider, MD  sevelamer (RENAGEL) 800 MG tablet Take 1,600-3,200 mg by mouth See admin instructions. 3,200 mg three times a day with meals and 1,600 mg twice a day with snacks   Yes Historical Provider, MD  atorvastatin (LIPITOR) 80 MG tablet Take 1 tablet (80 mg total) by mouth daily. Patient not taking: Reported on 03/02/2016 09/02/15   Arnoldo Morale, MD  oxyCODONE-acetaminophen (ROXICET) 5-325 MG tablet Take 1-2 tablets by mouth every 4 (four) hours as needed for severe  pain. Patient not taking: Reported on 03/02/2016 02/07/15   Alvia Grove, PA-C   Current Facility-Administered Medications  Medication Dose Route Frequency Provider Last Rate Last Dose  . acetaminophen (TYLENOL) tablet 650 mg  650 mg Oral Q6H PRN Rise Patience, MD   650 mg at 03/02/16 0518   Or  . acetaminophen (TYLENOL) suppository 650 mg  650 mg Rectal Q6H PRN Rise Patience, MD      . albuterol (PROVENTIL) (2.5 MG/3ML) 0.083% nebulizer solution 2.5 mg  2.5 mg Inhalation Q6H PRN Rise Patience, MD      . allopurinol (ZYLOPRIM) tablet 100 mg  100 mg Oral BID Rise Patience, MD   100 mg at 03/02/16 8315  . atorvastatin (LIPITOR) tablet 80 mg  80 mg Oral Daily Rise Patience, MD   80 mg at 03/02/16 1761  . calcium-vitamin D (OSCAL WITH D) 500-200 MG-UNIT per tablet 4 tablet  4 tablet Oral Q M,W,F-HD Rise Patience, MD      . ceFEPIme (MAXIPIME) 1 g in dextrose 5 % 50 mL IVPB  1 g Intravenous Q24H Rise Patience, MD      . Chlorhexidine Gluconate Cloth 2 %  PADS 6 each  6 each Topical Q0600 Jonetta Osgood, MD      . cinacalcet (SENSIPAR) tablet 30 mg  30 mg Oral Q breakfast Rise Patience, MD      . famotidine (PEPCID) tablet 20 mg  20 mg Oral BID Rise Patience, MD   20 mg at 03/02/16 1660  . heparin injection 5,000 Units  5,000 Units Subcutaneous Q8H Shanker Kristeen Mans, MD      . morphine 2 MG/ML injection 1 mg  1 mg Intravenous Q4H PRN Jonetta Osgood, MD   1 mg at 03/02/16 0935  . multivitamin (RENA-VIT) tablet 1 tablet  1 tablet Oral QHS Rise Patience, MD      . mupirocin ointment (BACTROBAN) 2 % 1 application  1 application Nasal BID Jonetta Osgood, MD      . ondansetron [facility]) tablet 4 mg  4 mg Oral Q6H PRN Rise Patience, MD       Or  . ondansetron (ZOFRAN) injection 4 mg  4 mg Intravenous Q6H PRN Rise Patience, MD      . oxyCODONE (Oxy IR/ROXICODONE) immediate release tablet 5 mg  5 mg Oral Q6H PRN Shanker Kristeen Mans, MD      . pantoprazole (PROTONIX) EC tablet 40 mg  40 mg Oral BID Rise Patience, MD   40 mg at 03/02/16 6301  . sevelamer carbonate (RENVELA) tablet 1,600 mg  1,600 mg Oral TID WC Rise Patience, MD      . sodium polystyrene (KAYEXALATE) 15 GM/60ML suspension 30 g  30 g Oral Once Jonetta Osgood, MD      . vancomycin (VANCOCIN) IVPB 750 mg/150 ml premix  750 mg Intravenous Q M,W,F-HD Rise Patience, MD       Labs: Basic Metabolic Panel:  Recent Labs Lab 03/02/16 0105 03/02/16 0836  NA 136 134*  K 5.5* 6.0*  CL 100* 99*  CO2 21* 20*  GLUCOSE 100* 103*  BUN 68* 70*  CREATININE 10.03* 10.54*  CALCIUM 9.1 8.9   CBC:  Recent Labs Lab 03/02/16 0105 03/02/16 0836  WBC 8.3 7.3  NEUTROABS 7.0  --   HGB 11.6* 11.7*  HCT 35.4* 36.2  MCV 80.6 80.8  PLT 121* 104*   Iron Studies: No results for input(s): IRON, TIBC, TRANSFERRIN, FERRITIN in the last 72 hours. Studies/Results: No results found.  ROS: As per HPI otherwise negative.   Physical Exam: Vitals:   03/02/16 0302 03/02/16 0323 03/02/16 0913 03/02/16 1222  BP: 145/74 (!) 152/69 (!) 148/81   Pulse: 103 (!) 102 (!) 107   Resp: 20 19 20    Temp:  (!) 101.9 F (38.8 C) (!) 103 F (39.4 C) (!) 103 F (39.4 C)  TempSrc:  Oral Oral Oral  SpO2: 100% 98% 100%   Weight:  84 kg (185 lb 3 oz)    Height:  5\' 2"  (1.575 m)       General: Well developed, well nourished, in no acute distress. Head: Normocephalic, atraumatic, sclera non-icteric, mucus membranes are moist Neck: Supple. JVD not elevated. Lungs: Clear bilaterally to auscultation without wheezes, rales, or rhonchi. Breathing is unlabored. Heart: RRR with S1 S2. No murmurs, rubs, or gallops appreciated. Abdomen: Soft, non-tender, non-distended with normoactive bowel sounds. No rebound/guarding. No obvious abdominal masses. M-S:  Strength and tone appear normal for age. Lower extremities:without edema or ischemic changes, no open wounds   Neuro: Alert and oriented X 3. Moves all extremities  spontaneously. Psych:  Responds to questions appropriately with a normal affect. Dialysis Access: RUA AVF area erythematous, exquisitely tender to touch. Has abscess type formation venous button, draining pus after arterial portion cannulated.   Access history: 12/06/14 Placement 1st stage R basilic vein tranposition-Dr. Trula Slade 02/07/15 Elevation of R basicilic vein fistula/redo arterial anastomosis, branch ligation X 3-Dr. Trula Slade 02/07/15 Evacuation of R Arm Hematoma-Dr. Trula Slade 06/17/15 Thrombectomy without revision-Dr. Augustin Coupe 07/15/15 Fistulagram/Venous angioplasty-Dr. Posey Pronto 10/17/15 Fstulagram Dr. Augustin Coupe 12/26/15 Fistulagram/Venous angioplasty-Dr. Augustin Coupe.   Dialysis Orders: [facility] MWF 4 hours 400/800 81 Kg 2.0K/2.0 Ca Heparin 7000 units IV q treatment Calcitriol 2.0 mcg PO q treatment (last PTH 407 01/29/16)  BMD medications:  Sevelamer 800 mg 4 tablets PO TID w/meals Sensipar 30 mg PO daily   Recent labs: HGB 11.9 (02/26/16) Fe 55 Tsat 25% 01/29/16   Assessment/Plan: 1.  Infected RUA AVF: T Max 103.0. WBC 7.3. BC pending. Vanc/cefepime ordered. 2. Hyperkalemia: K+ 6.0. 12 lead EKG without peaked t-waves, acute changes. Will give Kayexalate at 1600 if she has not been taken to OR for cath placement.  3.  ESRD -  MWF holding HD for now.  4.  Hypertension/volume  - No antihypertensive meds on OP med list. Standing pre wt 83.5 kg. UFG to OP EDW 81 kg.  5.  Anemia  - HGB 11.7. No OP ESA. None needed now. Follow HGB.  6.  Metabolic bone disease -  Cont binders/VDRA 7.  Nutrition - Renal diet w/fld restrictions/nepro/renal vits.   Rita H. Owens Shark, NP-C 03/02/2016, 2:26 PM  [employer] [phone]  Pt seen, examined and agree w A/P as above. ESRD patient with fever 103 and redness over AVF RUA.  Has a buttonhole abscess that just drained in HD.  Unable to use AVF for HD.  Have d/w Dr Trula Slade, will need HD cath,  possible temporary.  Will rx K+ with kayexalate for today.   Kelly Splinter MD [facility] [employer] pager [phone]   03/02/2016, 3:55 PM

## 2016-03-02 NOTE — Consult Note (Signed)
Consult Note  Patient name: Rachael George MRN: [id number] DOB: 13-Oct-1955 Sex: female  Consulting Physician:  Dr. Hal Hope  Reason for Consult:  Chief Complaint  Patient presents with  . Vascular Access Problem    HISTORY OF PRESENT ILLNESS: This is a 60 yo female who is s/p right basilic vein fistula creation, the second stage was on 02/07/2015.  This was complicated by bleeding requiring a return trip to the operating room.  She recovered from this and has been able to dialyze using her fistula.  She presented to the ER with complaints of pain and swelling in the right arm over top of the fistula that has been going on for proximally one week and has gotten worse.  She last dialyzed on Friday.  She denies any fevers or chills.  She states that it is difficult to cannulate her fistula and that only 2 technologist are routinely successful.   Complications from renal failure are secondary hyperparathyroidism and anemia.  She suffers from hyperlipidemia which is managed with a statin.  She is on multiple medications for hypertension.  She is a nonsmoker.  Past Medical History:  Diagnosis Date  . Anemia   . Asthma   . GERD (gastroesophageal reflux disease)   . Gout   . Heavy menstrual bleeding   . High cholesterol   . History of blood transfusion   . Hyperlipemia 12/07/2012  . Hypertension   . Kidney disease   . Kidney stones   . Prediabetes   . RA (rheumatoid arthritis) ([place])   . Sleep apnea     Past Surgical History:  Procedure Laterality Date  . ABDOMINAL HYSTERECTOMY  2000  . BASCILIC VEIN TRANSPOSITION Right 12/06/2014   Procedure: FIRST STAGE BASILIC VEIN TRANSPOSITION - RIGHT;  Surgeon: Serafina Mitchell, MD;  Location: [place];  Service: Vascular;  Laterality: Right;  . BASCILIC VEIN TRANSPOSITION Right 02/07/2015   Procedure: RIGHT ARM SECOND STAGE BASILIC VEIN TRANSPOSITION;  Surgeon: Serafina Mitchell, MD;  Location: [place];  Service: Vascular;  Laterality:  Right;  . ECTOPIC PREGNANCY SURGERY  02/1982  . I&D EXTREMITY Right 02/07/2015   Procedure: IRRIGATION AND DEBRIDEMENT RIGHT ARM HEMATOMA;  Surgeon: Serafina Mitchell, MD;  Location: [facility] OR;  Service: Vascular;  Laterality: Right;  . left shoulder surgery  08/2005  . TUBAL LIGATION  11/1986    Social History   Social History  . Marital status: Legally Separated    Spouse name: N/A  . Number of children: 3  . Years of education: N/A   Occupational History  . Not on file.   Social History Main Topics  . Smoking status: Never Smoker  . Smokeless tobacco: Never Used  . Alcohol use 0.0 oz/week     Comment: occ - 1 drink rarely  . Drug use: No  . Sexual activity: Not on file   Other Topics Concern  . 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 Hypertension Mother   . Breast cancer Maternal Aunt   . Deep vein thrombosis Daughter   .  Hyperlipidemia Daughter   . Hypertension Daughter   . Prostate cancer Maternal Grandmother     Allergies as of 03/01/2016 - Review Complete 03/01/2016  Allergen Reaction Noted  . Atacand [candesartan] Anaphylaxis 12/07/2012  . Lisinopril Anaphylaxis 12/07/2012  . Motrin [ibuprofen] Anaphylaxis 12/07/2012  . Nsaids Anaphylaxis 12/07/2012  . Tolmetin Anaphylaxis 02/04/2015  . Penicillins Rash 12/07/2012    No current facility-administered medications on file prior to encounter.    Current Outpatient Prescriptions on File Prior to Encounter  Medication Sig Dispense Refill  . albuterol (PROAIR HFA) 108 (90 Base) MCG/ACT inhaler Inhale 2 puffs into the lungs every 6 (six) hours as needed for wheezing. 1 Inhaler 4  . allopurinol (ZYLOPRIM) 100 MG tablet Take 100 mg by mouth 2 (two)  times daily. Reported on 09/02/2015    . cinacalcet (SENSIPAR) 30 MG tablet Take 30 mg by mouth daily.    . colchicine 0.6 MG tablet Take 2 tabs (1.2mg ) at the onset of a gout flare, may repeat 1 tab (0.6mg ) if symptoms persist 30 tablet 2  . atorvastatin (LIPITOR) 80 MG tablet Take 1 tablet (80 mg total) by mouth daily. (Patient not taking: Reported on 03/02/2016) 90 tablet 1  . oxyCODONE-acetaminophen (ROXICET) 5-325 MG tablet Take 1-2 tablets by mouth every 4 (four) hours as needed for severe pain. (Patient not taking: Reported on 03/02/2016) 30 tablet 0     REVIEW OF SYSTEMS: Cardiovascular: No chest pain, chest pressure, palpitations, orthopnea, or dyspnea on exertion. No claudication or rest pain,  No history of DVT or phlebitis. Pulmonary: No productive cough, asthma or wheezing. Neurologic: No weakness, paresthesias, aphasia, or amaurosis. No dizziness. Hematologic: No bleeding problems or clotting disorders. Musculoskeletal: No joint pain or joint swelling. Gastrointestinal: No blood in stool or hematemesis Genitourinary: No dysuria or hematuria. Psychiatric:: No history of major depression. Integumentary: No rashes or ulcers. Constitutional: No fever or chills.  PHYSICAL EXAMINATION: General: The patient appears their stated age.  Vital signs are BP (!) 152/69 (BP Location: Left Arm)   Pulse (!) 102   Temp (!) 101.9 F (38.8 C) (Oral)   Resp 19   Ht 5\' 2"  (1.575 m)   Wt 185 lb 3 oz (84 kg)   SpO2 98%   BMI 33.87 kg/m  Pulmonary: Respirations are non-labored HEENT:  No gross abnormalities Abdomen: Soft and non-tender  Musculoskeletal: There are no major deformities.   Neurologic: No focal weakness or paresthesias are detected, Skin: There are no ulcer or rashes noted. Psychiatric: The patient has normal affect. Cardiovascular: Palpable thrill within right upper arm fistula.  There is some erythema around a raised area.  Diagnostic Studies: None    Assessment:    End-stage renal disease Plan: The patient has developed what looks like a cellulitis over top of her right arm fistula.  There is no foreign material in the arm, and so I suspect this will improve with IV antibiotics.  I would reserve surgical intervention for failure of antibiotic therapy.  We will continue to follow the patient.      Eldridge Abrahams, M.D. [facility] [place] [facility] Office: [phone] Pager:  [phone]

## 2016-03-02 NOTE — Progress Notes (Signed)
Temp 103.1 MD notified.

## 2016-03-03 ENCOUNTER — Inpatient Hospital Stay (HOSPITAL_COMMUNITY): Payer: Medicare Other

## 2016-03-03 ENCOUNTER — Encounter (HOSPITAL_COMMUNITY)
Admission: EM | Disposition: A | Discharge status: Home / Self Care | Payer: Self-pay | Source: Home / Self Care | Attending: Internal Medicine

## 2016-03-03 ENCOUNTER — Inpatient Hospital Stay (HOSPITAL_COMMUNITY): Payer: Medicare Other | Admitting: Certified Registered"

## 2016-03-03 ENCOUNTER — Encounter (HOSPITAL_COMMUNITY): Payer: Self-pay | Admitting: Certified Registered"

## 2016-03-03 DIAGNOSIS — D62 Acute posthemorrhagic anemia: Secondary | ICD-10-CM

## 2016-03-03 DIAGNOSIS — B9561 Methicillin susceptible Staphylococcus aureus infection as the cause of diseases classified elsewhere: Secondary | ICD-10-CM

## 2016-03-03 DIAGNOSIS — T8119XA Other postprocedural shock, initial encounter: Secondary | ICD-10-CM

## 2016-03-03 DIAGNOSIS — R7881 Bacteremia: Secondary | ICD-10-CM

## 2016-03-03 DIAGNOSIS — R571 Hypovolemic shock: Secondary | ICD-10-CM | POA: Diagnosis not present

## 2016-03-03 DIAGNOSIS — N186 End stage renal disease: Secondary | ICD-10-CM

## 2016-03-03 HISTORY — PX: INSERTION OF DIALYSIS CATHETER: SHX1324

## 2016-03-03 HISTORY — PX: LIGATION OF ARTERIOVENOUS  FISTULA: SHX5948

## 2016-03-03 LAB — CBC
HCT: 25.8 % — ABNORMAL LOW (ref 36.0–46.0)
Hemoglobin: 8.6 g/dL — ABNORMAL LOW (ref 12.0–15.0)
MCH: 27.2 pg (ref 26.0–34.0)
MCHC: 33.3 g/dL (ref 30.0–36.0)
MCV: 81.6 fL (ref 78.0–100.0)
Platelets: 73 10*3/uL — ABNORMAL LOW (ref 150–400)
RBC: 3.16 MIL/uL — ABNORMAL LOW (ref 3.87–5.11)
RDW: 16 % — ABNORMAL HIGH (ref 11.5–15.5)
WBC: 9.6 10*3/uL (ref 4.0–10.5)

## 2016-03-03 LAB — HEMOGLOBIN AND HEMATOCRIT, BLOOD
HCT: 25.3 % — ABNORMAL LOW (ref 36.0–46.0)
Hemoglobin: 8.3 g/dL — ABNORMAL LOW (ref 12.0–15.0)

## 2016-03-03 LAB — BASIC METABOLIC PANEL
Anion gap: 13 (ref 5–15)
BUN: 72 mg/dL — ABNORMAL HIGH (ref 6–20)
CO2: 21 mmol/L — ABNORMAL LOW (ref 22–32)
Calcium: 7.8 mg/dL — ABNORMAL LOW (ref 8.9–10.3)
Chloride: 101 mmol/L (ref 101–111)
Creatinine, Ser: 11 mg/dL — ABNORMAL HIGH (ref 0.44–1.00)
GFR calc Af Amer: 4 mL/min — ABNORMAL LOW (ref >60)
GFR calc non Af Amer: 3 mL/min — ABNORMAL LOW (ref >60)
Glucose, Bld: 133 mg/dL — ABNORMAL HIGH (ref 65–99)
Potassium: 4.7 mmol/L (ref 3.5–5.1)
Sodium: 135 mmol/L (ref 135–145)

## 2016-03-03 LAB — PREPARE RBC (CROSSMATCH)

## 2016-03-03 LAB — HIV ANTIBODY (ROUTINE TESTING W REFLEX): HIV Screen 4th Generation wRfx: NONREACTIVE

## 2016-03-03 LAB — PROTIME-INR
INR: 1.19
Prothrombin Time: 15.2 seconds (ref 11.4–15.2)

## 2016-03-03 LAB — ABO/RH: ABO/RH(D): B POS

## 2016-03-03 LAB — BLOOD PRODUCT ORDER (VERBAL) VERIFICATION

## 2016-03-03 LAB — APTT: aPTT: 31 seconds (ref 24–36)

## 2016-03-03 SURGERY — INSERTION OF DIALYSIS CATHETER
Anesthesia: Monitor Anesthesia Care | Site: Neck

## 2016-03-03 SURGERY — LIGATION OF ARTERIOVENOUS  FISTULA
Anesthesia: Monitor Anesthesia Care | Site: Arm Lower | Laterality: Right

## 2016-03-03 MED ORDER — PROPOFOL 500 MG/50ML IV EMUL
INTRAVENOUS | Status: DC | PRN
  Administered 2016-03-03: 75 ug/kg/min via INTRAVENOUS

## 2016-03-03 MED ORDER — FENTANYL CITRATE (PF) 100 MCG/2ML IJ SOLN
INTRAMUSCULAR | Status: AC
  Filled 2016-03-03: qty 2

## 2016-03-03 MED ORDER — HEPARIN SODIUM (PORCINE) 1000 UNIT/ML IJ SOLN
INTRAMUSCULAR | Status: AC
  Filled 2016-03-03: qty 1

## 2016-03-03 MED ORDER — SODIUM CHLORIDE 0.9 % IV SOLN
INTRAVENOUS | Status: DC | PRN
  Administered 2016-03-03: 03:00:00 500 mL

## 2016-03-03 MED ORDER — FENTANYL CITRATE (PF) 100 MCG/2ML IJ SOLN: 25.0000 ug | INTRAMUSCULAR | Status: DC | PRN

## 2016-03-03 MED ORDER — VANCOMYCIN HCL IN DEXTROSE 750-5 MG/150ML-% IV SOLN
INTRAVENOUS | Status: AC
  Administered 2016-03-03: 750 mg via INTRAVENOUS
  Filled 2016-03-03: qty 150

## 2016-03-03 MED ORDER — FENTANYL CITRATE (PF) 100 MCG/2ML IJ SOLN
INTRAMUSCULAR | Status: DC | PRN
  Administered 2016-03-03 (×2): 50 ug via INTRAVENOUS

## 2016-03-03 MED ORDER — LIDOCAINE HCL (PF) 1 % IJ SOLN
INTRAMUSCULAR | Status: DC | PRN
  Administered 2016-03-03: 26 mL

## 2016-03-03 MED ORDER — MIDAZOLAM HCL 5 MG/5ML IJ SOLN
INTRAMUSCULAR | Status: DC | PRN
  Administered 2016-03-03: 2 mg via INTRAVENOUS

## 2016-03-03 MED ORDER — HEPARIN SODIUM (PORCINE) 1000 UNIT/ML IJ SOLN
INTRAMUSCULAR | Status: DC | PRN
  Administered 2016-03-03: 3.4 mL

## 2016-03-03 MED ORDER — 0.9 % SODIUM CHLORIDE (POUR BTL) OPTIME
TOPICAL | Status: DC | PRN
  Administered 2016-03-03: 1000 mL

## 2016-03-03 MED ORDER — SODIUM CHLORIDE 0.9 % IV SOLN: Freq: Once | INTRAVENOUS | Status: AC

## 2016-03-03 MED ORDER — NOREPINEPHRINE BITARTRATE 1 MG/ML IV SOLN
0.0000 ug/min | INTRAVENOUS | Status: DC
  Administered 2016-03-03: 20 ug/min via INTRAVENOUS
  Administered 2016-03-03: 10 ug/min via INTRAVENOUS
  Filled 2016-03-03: qty 4

## 2016-03-03 MED ORDER — MIDAZOLAM HCL 2 MG/2ML IJ SOLN
INTRAMUSCULAR | Status: AC
  Filled 2016-03-03: qty 2

## 2016-03-03 MED ORDER — VANCOMYCIN HCL IN DEXTROSE 750-5 MG/150ML-% IV SOLN
750.0000 mg | INTRAVENOUS | Status: AC
  Administered 2016-03-03: 750 mg via INTRAVENOUS
  Filled 2016-03-03: qty 150

## 2016-03-03 MED ORDER — LIDOCAINE HCL (PF) 1 % IJ SOLN
INTRAMUSCULAR | Status: DC | PRN
  Administered 2016-03-03: 10 mL

## 2016-03-03 MED ORDER — PROPOFOL 1000 MG/100ML IV EMUL
INTRAVENOUS | Status: AC
  Filled 2016-03-03: qty 100

## 2016-03-03 MED ORDER — VANCOMYCIN HCL IN DEXTROSE 750-5 MG/150ML-% IV SOLN
750.0000 mg | Freq: Once | INTRAVENOUS | Status: DC
  Filled 2016-03-03: qty 150

## 2016-03-03 MED ORDER — LIDOCAINE HCL (PF) 1 % IJ SOLN
INTRAMUSCULAR | Status: AC
  Filled 2016-03-03: qty 30

## 2016-03-03 MED ORDER — CALCITRIOL 0.5 MCG PO CAPS: 2.0000 ug | ORAL_CAPSULE | ORAL | Status: DC

## 2016-03-03 MED ORDER — SODIUM CHLORIDE 0.9 % IV SOLN
INTRAVENOUS | Status: DC | PRN
  Administered 2016-03-03: 02:00:00 via INTRAVENOUS

## 2016-03-03 MED ORDER — OXYCODONE-ACETAMINOPHEN 5-325 MG PO TABS
1.0000 | ORAL_TABLET | ORAL | Status: DC | PRN
  Administered 2016-03-04 – 2016-03-05 (×2): 1 via ORAL
  Filled 2016-03-03 (×2): qty 1

## 2016-03-03 MED ORDER — LIDOCAINE 2% (20 MG/ML) 5 ML SYRINGE
INTRAMUSCULAR | Status: AC
  Filled 2016-03-03: qty 5

## 2016-03-03 MED ORDER — IOPAMIDOL (ISOVUE-300) INJECTION 61%
INTRAVENOUS | Status: AC
  Filled 2016-03-03: qty 50

## 2016-03-03 MED ORDER — PROPOFOL 10 MG/ML IV BOLUS
INTRAVENOUS | Status: AC
  Filled 2016-03-03: qty 20

## 2016-03-03 MED FILL — Medication: Qty: 1 | Status: AC

## 2016-03-03 SURGICAL SUPPLY — 67 items
BAG DECANTER FOR FLEXI CONT (MISCELLANEOUS) ×3 IMPLANT
BANDAGE ACE 4X5 VEL STRL LF (GAUZE/BANDAGES/DRESSINGS) ×3 IMPLANT
BIOPATCH RED 1 DISK 7.0 (GAUZE/BANDAGES/DRESSINGS) ×3 IMPLANT
BIOPATCH WHT 1IN DISK W/4.0 H (GAUZE/BANDAGES/DRESSINGS) ×3 IMPLANT
BNDG GAUZE ELAST 4 BULKY (GAUZE/BANDAGES/DRESSINGS) ×3 IMPLANT
CANISTER SUCTION 2500CC (MISCELLANEOUS) ×3 IMPLANT
CANNULA VESSEL 3MM 2 BLNT TIP (CANNULA) ×3 IMPLANT
CATH PALINDROME RT-P 15FX19CM (CATHETERS) IMPLANT
CATH PALINDROME RT-P 15FX23CM (CATHETERS) ×3 IMPLANT
CATH PALINDROME RT-P 15FX28CM (CATHETERS) IMPLANT
CATH PALINDROME RT-P 15FX55CM (CATHETERS) IMPLANT
CATH STRAIGHT 5FR 65CM (CATHETERS) IMPLANT
CHLORAPREP W/TINT 26ML (MISCELLANEOUS) ×3 IMPLANT
CLIP TI MEDIUM 6 (CLIP) IMPLANT
CLIP TI WIDE RED SMALL 6 (CLIP) IMPLANT
COVER PROBE W GEL 5X96 (DRAPES) ×3 IMPLANT
COVER SURGICAL LIGHT HANDLE (MISCELLANEOUS) ×3 IMPLANT
DECANTER SPIKE VIAL GLASS SM (MISCELLANEOUS) IMPLANT
DERMABOND ADVANCED (GAUZE/BANDAGES/DRESSINGS) ×1
DERMABOND ADVANCED .7 DNX12 (GAUZE/BANDAGES/DRESSINGS) ×2 IMPLANT
DRAPE C-ARM 42X72 X-RAY (DRAPES) ×3 IMPLANT
DRAPE CHEST BREAST 15X10 FENES (DRAPES) ×3 IMPLANT
ELECT REM PT RETURN 9FT ADLT (ELECTROSURGICAL) ×3
ELECTRODE REM PT RTRN 9FT ADLT (ELECTROSURGICAL) ×2 IMPLANT
GAUZE SPONGE 2X2 8PLY STRL LF (GAUZE/BANDAGES/DRESSINGS) ×2 IMPLANT
GAUZE SPONGE 4X4 16PLY XRAY LF (GAUZE/BANDAGES/DRESSINGS) ×3 IMPLANT
GEL ULTRASOUND 20GR AQUASONIC (MISCELLANEOUS) IMPLANT
GLOVE BIO SURGEON STRL SZ7.5 (GLOVE) ×6 IMPLANT
GLOVE BIOGEL PI IND STRL 6.5 (GLOVE) ×4 IMPLANT
GLOVE BIOGEL PI IND STRL 7.5 (GLOVE) ×2 IMPLANT
GLOVE BIOGEL PI INDICATOR 6.5 (GLOVE) ×2
GLOVE BIOGEL PI INDICATOR 7.5 (GLOVE) ×1
GLOVE SURG SS PI 7.5 STRL IVOR (GLOVE) ×9 IMPLANT
GOWN STRL REUS W/ TWL LRG LVL3 (GOWN DISPOSABLE) ×8 IMPLANT
GOWN STRL REUS W/TWL LRG LVL3 (GOWN DISPOSABLE) ×4
KIT BASIN OR (CUSTOM PROCEDURE TRAY) ×3 IMPLANT
KIT ROOM TURNOVER OR (KITS) ×3 IMPLANT
LOOP VESSEL MINI RED (MISCELLANEOUS) IMPLANT
NEEDLE 18GX1X1/2 (RX/OR ONLY) (NEEDLE) ×3 IMPLANT
NEEDLE HYPO 25GX1X1/2 BEV (NEEDLE) ×3 IMPLANT
NS IRRIG 1000ML POUR BTL (IV SOLUTION) ×3 IMPLANT
PACK CV ACCESS (CUSTOM PROCEDURE TRAY) ×3 IMPLANT
PACK SURGICAL SETUP 50X90 (CUSTOM PROCEDURE TRAY) ×3 IMPLANT
PAD ARMBOARD 7.5X6 YLW CONV (MISCELLANEOUS) ×6 IMPLANT
SET MICROPUNCTURE 5F STIFF (MISCELLANEOUS) IMPLANT
SLEEVE SURGEON STRL (DRAPES) ×3 IMPLANT
SPONGE GAUZE 2X2 STER 10/PKG (GAUZE/BANDAGES/DRESSINGS) ×1
SPONGE GAUZE 4X4 12PLY STER LF (GAUZE/BANDAGES/DRESSINGS) ×3 IMPLANT
SPONGE SURGIFOAM ABS GEL 100 (HEMOSTASIS) IMPLANT
SUT ETHILON 3 0 PS 1 (SUTURE) ×12 IMPLANT
SUT PROLENE 5 0 C 1 24 (SUTURE) ×3 IMPLANT
SUT PROLENE 6 0 CC (SUTURE) IMPLANT
SUT SILK 0 FSL (SUTURE) IMPLANT
SUT SILK 0 TIES 10X30 (SUTURE) ×3 IMPLANT
SUT VIC AB 3-0 SH 27 (SUTURE) ×1
SUT VIC AB 3-0 SH 27X BRD (SUTURE) ×2 IMPLANT
SUT VICRYL 4-0 PS2 18IN ABS (SUTURE) ×6 IMPLANT
SYR 20CC LL (SYRINGE) ×6 IMPLANT
SYR 5ML LL (SYRINGE) ×6 IMPLANT
SYR CONTROL 10ML LL (SYRINGE) ×3 IMPLANT
SYRINGE 10CC LL (SYRINGE) ×3 IMPLANT
TAPE CLOTH SURG 4X10 WHT LF (GAUZE/BANDAGES/DRESSINGS) ×6 IMPLANT
TOWEL OR 17X24 6PK STRL BLUE (TOWEL DISPOSABLE) ×3 IMPLANT
TOWEL OR 17X26 10 PK STRL BLUE (TOWEL DISPOSABLE) ×3 IMPLANT
UNDERPAD 30X30 (UNDERPADS AND DIAPERS) ×3 IMPLANT
WATER STERILE IRR 1000ML POUR (IV SOLUTION) ×3 IMPLANT
WIRE AMPLATZ SS-J .035X180CM (WIRE) IMPLANT

## 2016-03-03 NOTE — Op Note (Signed)
Procedure: Ligation right arm AV fistula, ultrasound neck, insertion palindrome catheter left internal jugular vein  Preoperative diagnosis: #1 bleeding right arm AV fistula #2 end-stage renal disease  Postoperative diagnosis: Same  Anesthesia: Local with IV sedation  Operative findings: Aneurysmal degeneration right arm AV fistula, 4 cm aneurysm  23 cm Diatek catheter left internal jugular vein  Operative details: After obtaining informed consent, the patient was taken to the operating. The patient's placed in supine position operating table. The right upper extremity was prepped and draped in usual sterile fashion. Local anesthesia was infiltrated around a necrotic skin lesion in the mid right upper arm. An elliptical incision was made in this location incorporating all of the necrotic skin. Incision was carried down through the subcutaneous tissues down to level fistula. 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 This was dissected free circumferentially. It was then ligated above and below the aneurysm with a 0 silk tie. The aneurysm was then completely excised. The orifice of the proximal portion of the fistula with an oversewn with a running 5-0 Prolene suture. The distal orifice of the fistula was then also oversewn in similar fashion. The wound was thoroughly irrigated with normal saline solution. The skin edges were then reapproximated using alternating interrupted 3 0 vertical mattress nylon sutures and simple nylon sutures with 3-0 nylon. A dry sterile dressing was then applied.  At this point the table was turned and the patient placed in position to place a tunneled dialysis catheter. The patient's entire neck and chest were prepped and draped in usual sterile fashion. The patient was placed in Trendelenburg position. Ultrasound was used to identify the patient's right internal jugular vein. This was fairly small compared to the right. This had normal  compressibility and respiratory variation. The left internal jugular vein was larger.  However, due to the patient's hypovolemia the vein would completely collapse flat with inspiration making cannulation more difficult.  Local anesthesia was infiltrated over the right jugular vein.  Using ultrasound guidance, the left internal jugular vein was successfully cannulated after several attempts.  A 0.035 J-tipped guidewire was threaded into the left internal jugular vein and into the superior vena cava followed by the inferior vena cava under fluoroscopic guidance.   Next sequential 12 and 14 dilators were placed over the guidewire into the right atrium.  A 16 French dilator with a peel-away sheath was then placed over the guidewire into the right atrium.   The guidewire and dilator were removed. A 23 cm Palindrome catheter was then placed through the peel away sheath into the right atrium.  The catheter was then tunneled subcutaneously, cut to length, and the hub attached. The catheter was noted to flush and draw easily. The catheter was inspected under fluoroscopy and found with its tip to be in the right atrium without any kinks throughout its course. The catheter was sutured to the skin with nylon sutures. The neck insertion site was closed with Vicryl stitch. The catheter was then loaded with concentrated Heparin solution. A dry sterile dressing was applied.  The patient tolerated procedure well and there were no complications. Instrument sponge and needle counts correct in the case. The patient was taken to the recovery room in stable condition. Chest x-ray will be obtained in the recovery room.  Ruta Hinds, MD [facility] [place] Office: [phone] Pager: [phone]

## 2016-03-03 NOTE — Progress Notes (Addendum)
  Progress Note    03/03/2016 8:07 AM Day of Surgery  Subjective:  Just a little soreness  Tm 103 (yesterday afternoon) now 98.8 235'T-614'E systolic HR  31'V-400'Q NSR/ST 100% RA  Vitals:   03/03/16 0700 03/03/16 0800  BP: (!) 153/90 (!) 150/88  Pulse: 92 91  Resp: 16 16  Temp:      Physical Exam: Lungs:  Non labored Incisions:  Clean and dry with nylon sutures in place; some serosanguinous drainage from the mid portion of the incision.    CBC    Component Value Date/Time   WBC 9.6 03/03/2016 0454   RBC 3.16 (L) 03/03/2016 0454   HGB 8.6 (L) 03/03/2016 0454   HCT 25.8 (L) 03/03/2016 0454   PLT 73 (L) 03/03/2016 0454   MCV 81.6 03/03/2016 0454   MCH 27.2 03/03/2016 0454   MCHC 33.3 03/03/2016 0454   RDW 16.0 (H) 03/03/2016 0454   LYMPHSABS 0.8 03/02/2016 0105   MONOABS 0.4 03/02/2016 0105   EOSABS 0.0 03/02/2016 0105   BASOSABS 0.0 03/02/2016 0105    BMET    Component Value Date/Time   NA 135 03/03/2016 0454   K 4.7 03/03/2016 0454   CL 101 03/03/2016 0454   CO2 21 (L) 03/03/2016 0454   GLUCOSE 133 (H) 03/03/2016 0454   BUN 72 (H) 03/03/2016 0454   CREATININE 11.00 (H) 03/03/2016 0454   CREATININE 3.71 (H) 10/04/2014 1016   CALCIUM 7.8 (L) 03/03/2016 0454   GFRNONAA 3 (L) 03/03/2016 0454   GFRNONAA 13 (L) 10/04/2014 1016   GFRAA 4 (L) 03/03/2016 0454   GFRAA 15 (L) 10/04/2014 1016    INR    Component Value Date/Time   INR 1.19 03/03/2016 0053     Intake/Output Summary (Last 24 hours) at 03/03/16 0807 Last data filed at 03/03/16 0345  Gross per 24 hour  Intake              830 ml  Output               20 ml  Net              810 ml     Assessment:  60 y.o. female is s/p:  Ligation right arm AV fistula, ultrasound neck, insertion palindrome catheter left internal jugular vein  Day of Surgery  Plan: -pt doing well this am.  Incision looks good with some serosanguinous drainage from the mid portion.  Does not appear purulent.  Upon  speaking with Dr. Oneida Alar, he did not encounter any purulence during the operation.  -tm 103 last evening-now down to 98.8-continue abx per ID/primary team -for HD later this morning/afternoon.  After she has had some time to heal, will re-evaluate for new access. -acute blood loss anemia-hgb stable and slightly up at 8.6 from 8.3 at [facility] --ok to advance diet -ok to transfer to the floor -DVT prophylaxis:  SQ heparin to being at 1400 today   Leontine Locket, PA-C [facility] [phone] 03/03/2016 8:07 AM

## 2016-03-03 NOTE — Progress Notes (Signed)
Pharmacy Antibiotic Note  UMI MAINOR is a 60 y.o. female admitted on 03/01/2016 with cellulitis at site of right arm AV fistula.  Pharmacy has been consulted for vancomycin dosing. Dialysis originally scheduled MWF however did not go to dialysis yesterday (Monday, 12/11) as was being held until seen by [facility] per nephrology note.  Of note, patient developed bleeding at site of necrotic tissue around AV fistula and was taken to OR early 12/12 AM. AV fistula in R arm was ligated and palindrome catheter was inserted in left IJ vein. Pt to go for dialysis today (12/12). WBC up to 9.6, now afebrile, LA yesterday (12/11) was 1.77.  Plan:  Vancomycin 750 mg IV x 1 after dialysis today F/u returned to MWF dialysis schedule Monitor s/sx clinical improvement, CBC, TMax Vanc troughs PRN   Height: 5\' 2"  (157.5 cm) Weight: 185 lb 3 oz (84 kg) IBW/kg (Calculated) : 50.1  Temp (24hrs), Avg:100 F (37.8 C), Min:98.1 F (36.7 C), Max:103.1 F (39.5 C)   Recent Labs Lab 03/02/16 0105 03/02/16 0116 03/02/16 0836 03/03/16 0454  WBC 8.3  --  7.3 9.6  CREATININE 10.03*  --  10.54* 11.00*  LATICACIDVEN  --  1.77  --   --     Estimated Creatinine Clearance: 5.5 mL/min (by C-G formula based on SCr of 11 mg/dL (H)).    Allergies  Allergen Reactions  . Atacand [Candesartan] Anaphylaxis  . Lisinopril Anaphylaxis  . Motrin [Ibuprofen] Anaphylaxis  . Nsaids Anaphylaxis  . Tolmetin Anaphylaxis  . Penicillins Rash    Has patient had a PCN reaction causing immediate rash, facial/tongue/throat swelling, SOB or lightheadedness with hypotension: No Has patient had a PCN reaction causing severe rash involving mucus membranes or skin necrosis: No Has patient had a PCN reaction that required hospitalization No Has patient had a PCN reaction occurring within the last 10 years: No If all of the above answers are "NO", then may proceed with Cephalosporin use.     Antimicrobials this admission:  Cefepime  12/11 >> 12/11 per ID Vanc 12/11 >>   Dose adjustments this admission:  n/a  Microbiology results:  12/11 BCx: 2/2 GPC in clusters - BCID MRSA 12/11 wound culture: mod staph aureus  12/11 MRSA PCR: pos  Thank you for allowing pharmacy to be a part of this patient's care.  Carlean Jews, Pharm.D. PGY1 Pharmacy Resident 12/12/201711:34 AM Pager [phone]

## 2016-03-03 NOTE — Significant Event (Signed)
Rapid Response Event Note RN called for bleeding from AV fistula.  Overview: Time Called: 2357 Arrival Time: 0000 Event Type: Other (Comment) (bleeding from AV fistula )  Initial Focused Assessment: Upon arrival pt in trendelenburg, skin cool and clammy, lethargic but easily aroused, answers questions appropriately. Pressure dressing applied to Right arm fistula wrapped with gauze and ace bandage. Site continued to bleed through dressing. BP 75/52, 59/46, HR 99, RR 16, 98% RA   Interventions: Manual pressure applied, Baltazar Najjar NP at bedside paged Dr. Oneida Alar, Levophed gtt started, pt transferred to 2S07. 20G IV started to Left hand. Gave 2 units of FFP, started 1st unit of PRBC.  BP 141/98, HR 103, RR 31, 100% RA, Levophed gtt decreased. BP 135/77, HR 111, RR 27, 100% RA, Levophed gtt stopped per verbal order by Baltazar Najjar NP who was at bedside.    Event Summary: Name of Physician Notified: Baltazar Najjar NP  at 2357 (PTA RRT )  Name of Consulting Physician Notified: Dr. Oneida Alar  Baltazar Najjar NP paged vascular ) at    Outcome: Transferred (Comment)  Event End Time: [place], [place]

## 2016-03-03 NOTE — Progress Notes (Signed)
Admission note:  Arrival Method: Patient arrived in w/c accompanied by the nurse from 2S. Mental Orientation:  Alert and oriented x 4. Telemetry: 6E-05, ST Assessment: See doc flow sheets. Skin: Warm, dry and intact and surgical incision on Right forearm with dressing on it, unable to assess, was changed today by the vascular per previous nurse. IV: Left hand and left arm, both saline lock. Pain: Denies any pain currently. Tubes: N/A Safety Measures: bed in low position, call bell and phone within reach. Fall Prevention Safety Plan: reviewed the plan, understood and acknowledged.  Admission Screening: Complete. 6700 Orientation: Patient has been oriented to the unit, staff and to the room.

## 2016-03-03 NOTE — Progress Notes (Signed)
PROGRESS NOTE        PATIENT DETAILS Name: Rachael George Age: 60 y.o. Sex: female Date of Birth: 11-Dec-1955 Admit Date: 03/01/2016 Admitting Physician Rise Patience, MD IFO:[email or web address], Jarold Song, MD  Brief Narrative: Patient is a 60 y.o. female with history of ESRD who presented with fever, pain and swelling of her right arm around her AV fistula site. Thought to have infection of AV fistula site along with Staph bacteremia. Hospital course complicated by bleeding right arm AV fistula leading to hemorrhagic shock and acute blood loss anemia-requiring emergent ligation of AV fistula on 12/12. See below for further details   Subjective: Afebrile overnight.   No chest pain or SOB  Assessment/Plan: Hemorrhagic shock due to bleeding right arm AV fistula: Occurred 12/12 early am. BP now stable-transiently required pressor support. Received 2 units of PRBC transfusion, and emergently taken by vascular surgery for Ligartion of the AV fistula. Monitored overnight in SICU-and now transferred back to the floor.   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 No further bleeding-VVS following for post op care  Acute blood loss anemia:due to bleeding right arm fistula-requiring PRBC transfusion-Monitor Hb  AV fistula Infection with Staph bacteremia: Continue empiric Vancomycin-ID following. Check Echo.   ESRD-on HD MWF: Nephrology consulted for dialysis care-unable to do HD yesterday-has new HD catheter placed 12/12 following ligation of AV fistula.   Hyperkalemia: resolved with Kayexalate, nephrology followiong for HD.  Thrombocytopenia: Mild-follow for now-likely worsened with bacteremia/bleeding etc. Hold heparin for now.Follow CBC  Dyslipidemia: Continue statin  Gout: No evidence of flare, continue allopurinol  DVT Prophylaxis: Hold Prophylactic Heparin-place SCD's-if  platelet count rebounds-will restart Heparin  Code Status: Full code   Family Communication: None at bedside  Disposition Plan: Remain inpatient  Antimicrobial agents: See below  Procedures: None  CONSULTS:  nephrology and vascular surgery  Time spent: 25 minutes-Greater than 50% of this time was spent in counseling, explanation of diagnosis, planning of further management, and coordination of care.  MEDICATIONS: Anti-infectives    Start     Dose/Rate Route Frequency Ordered Stop   03/03/16 0600  vancomycin (VANCOCIN) IVPB 1000 mg/200 mL premix  Status:  Discontinued     1,000 mg 200 mL/hr over 60 Minutes Intravenous On call to O.R. 03/02/16 1554 03/02/16 1602   03/02/16 2200  ceFEPIme (MAXIPIME) 1 g in dextrose 5 % 50 mL IVPB  Status:  Discontinued     1 g 100 mL/hr over 30 Minutes Intravenous Every 24 hours 03/02/16 0415 03/02/16 1922   03/02/16 1200  vancomycin (VANCOCIN) IVPB 750 mg/150 ml premix     750 mg 150 mL/hr over 60 Minutes Intravenous Every M-W-F (Hemodialysis) 03/02/16 0415     03/02/16 0430  vancomycin (VANCOCIN) IVPB 750 mg/150 ml premix     750 mg 150 mL/hr over 60 Minutes Intravenous  Once 03/02/16 0415 03/02/16 0621   03/02/16 0430  ceFEPIme (MAXIPIME) 1 g in dextrose 5 % 50 mL IVPB     1 g 100 mL/hr over 30 Minutes Intravenous  Once 03/02/16 0415 03/02/16 0549   03/02/16 0045  vancomycin (VANCOCIN) IVPB 1000 mg/200 mL premix     1,000 mg 200 mL/hr over 60 Minutes Intravenous  Once 03/02/16 0032 03/02/16 0319      Scheduled Meds: .  allopurinol  100 mg Oral BID  . atorvastatin  80 mg Oral Daily  . [START ON 03/04/2016] calcitRIOL  2 mcg Oral Q M,W,F-HD  . Chlorhexidine Gluconate Cloth  6 each Topical Q0600  . cinacalcet  30 mg Oral Q breakfast  . famotidine  20 mg Oral QHS  . heparin subcutaneous  5,000 Units Subcutaneous Q8H  . multivitamin  1 tablet Oral QHS  . mupirocin ointment  1 application Nasal BID  . pantoprazole  40 mg Oral BID  .  sevelamer carbonate  1,600 mg Oral TID WC  . vancomycin  750 mg Intravenous Q M,W,F-HD   Continuous Infusions: . norepinephrine (LEVOPHED) Adult infusion Stopped (03/03/16 0137)   PRN Meds:.acetaminophen **OR** acetaminophen, albuterol, morphine injection, ondansetron **OR** ondansetron (ZOFRAN) IV, oxyCODONE, oxyCODONE-acetaminophen   PHYSICAL EXAM: Vital signs: Vitals:   03/03/16 0700 03/03/16 0800 03/03/16 0900 03/03/16 1030  BP: (!) 153/90 (!) 150/88 (!) 150/125 (!) 134/94  Pulse: 92 91 87 (!) 103  Resp: 16 16 18 18   Temp:    98.2 F (36.8 C)  TempSrc:    Oral  SpO2: 100% 100% 100% 100%  Weight:      Height:       Filed Weights   03/01/16 2343 03/02/16 0323  Weight: 78 kg (172 lb) 84 kg (185 lb 3 oz)   Body mass index is 33.87 kg/m.   General appearance :Awake, alert, not in any distress. Speech Clear. Not toxic Looking Eyes:, pupils equally reactive to light and accomodation,no scleral icterus.Pink conjunctiva HEENT: Atraumatic and Normocephalic Neck: supple, no JVD. No cervical lymphadenopathy. No thyromegaly Resp:Good air entry bilaterally, no added sounds  CVS: S1 S2 regular, no murmurs.  GI: Bowel sounds present, Non tender and not distended with no gaurding, rigidity or rebound.No organomegaly Extremities: B/L Lower Ext shows no edema, both legs are warm to touch.Right arm bandaged-did not open.   Neurology:  speech clear,Non focal, sensation is grossly intact. Psychiatric: Normal judgment and insight. Alert and oriented x 3. Normal mood. Musculoskeletal:No digital cyanosis Skin:No Rash, warm and dry Wounds:N/A  I have personally reviewed following labs and imaging studies  LABORATORY DATA: CBC:  Recent Labs Lab 03/02/16 0105 03/02/16 0836 03/03/16 0053 03/03/16 0454  WBC 8.3 7.3  --  9.6  NEUTROABS 7.0  --   --   --   HGB 11.6* 11.7* 8.3* 8.6*  HCT 35.4* 36.2 25.3* 25.8*  MCV 80.6 80.8  --  81.6  PLT 121* 104*  --  73*    Basic Metabolic  Panel:  Recent Labs Lab 03/02/16 0105 03/02/16 0836 03/03/16 0454  NA 136 134* 135  K 5.5* 6.0* 4.7  CL 100* 99* 101  CO2 21* 20* 21*  GLUCOSE 100* 103* 133*  BUN 68* 70* 72*  CREATININE 10.03* 10.54* 11.00*  CALCIUM 9.1 8.9 7.8*    GFR: Estimated Creatinine Clearance: 5.5 mL/min (by C-G formula based on SCr of 11 mg/dL (H)).  Liver Function Tests: No results for input(s): AST, ALT, ALKPHOS, BILITOT, PROT, ALBUMIN in the last 168 hours. No results for input(s): LIPASE, AMYLASE in the last 168 hours. No results for input(s): AMMONIA in the last 168 hours.  Coagulation Profile:  Recent Labs Lab 03/03/16 0053  INR 1.19    Cardiac Enzymes: No results for input(s): CKTOTAL, CKMB, CKMBINDEX, TROPONINI in the last 168 hours.  BNP (last 3 results) No results for input(s): PROBNP in the last 8760 hours.  HbA1C: No results for input(s): HGBA1C in  the last 72 hours.  CBG: No results for input(s): GLUCAP in the last 168 hours.  Lipid Profile: No results for input(s): CHOL, HDL, LDLCALC, TRIG, CHOLHDL, LDLDIRECT in the last 72 hours.  Thyroid Function Tests: No results for input(s): TSH, T4TOTAL, FREET4, T3FREE, THYROIDAB in the last 72 hours.  Anemia Panel: No results for input(s): VITAMINB12, FOLATE, FERRITIN, TIBC, IRON, RETICCTPCT in the last 72 hours.  Urine analysis:    Component Value Date/Time   BILIRUBINUR neg 12/25/2013 1746   PROTEINUR >=300 12/25/2013 1746   UROBILINOGEN 0.2 12/25/2013 1746   NITRITE neg 12/25/2013 1746   LEUKOCYTESUR Negative 12/25/2013 1746    Sepsis Labs: Lactic Acid, Venous    Component Value Date/Time   LATICACIDVEN 1.77 03/02/2016 0116    MICROBIOLOGY: Recent Results (from the past 240 hour(s))  Culture, blood (Routine X 2) w Reflex to ID Panel     Status: Abnormal (Preliminary result)   Collection Time: 03/02/16  1:10 AM  Result Value Ref Range Status   Specimen Description BLOOD LEFT ARM  Final   Special Requests IN  PEDIATRIC BOTTLE 4CC  Final   Culture  Setup Time   Final    GRAM POSITIVE COCCI IN CLUSTERS IN PEDIATRIC BOTTLE CRITICAL RESULT CALLED TO, READ BACK BY AND VERIFIED WITH: LPablo Lawrence.D. 16:10 03/02/16 ([email or web address])    Culture (A)  Final    STAPHYLOCOCCUS AUREUS SUSCEPTIBILITIES TO FOLLOW    Report Status PENDING  Incomplete  Blood Culture ID Panel (Reflexed)     Status: Abnormal   Collection Time: 03/02/16  1:10 AM  Result Value Ref Range Status   Enterococcus species NOT DETECTED NOT DETECTED Final   Listeria monocytogenes NOT DETECTED NOT DETECTED Final   Staphylococcus species DETECTED (A) NOT DETECTED Final    Comment: CRITICAL RESULT CALLED TO, READ BACK BY AND VERIFIED WITH: LPablo Lawrence.D. 16:10 03/02/16 ([email or web address])    Staphylococcus aureus DETECTED (A) NOT DETECTED Final    Comment: CRITICAL RESULT CALLED TO, READ BACK BY AND VERIFIED WITH: LPablo Lawrence.D. 16:10 03/02/16 (wilsonm)    Methicillin resistance DETECTED (A) NOT DETECTED Final    Comment: CRITICAL RESULT CALLED TO, READ BACK BY AND VERIFIED WITH: LPablo Lawrence.D. 16:10 03/02/16 (wilsonm)    Streptococcus species NOT DETECTED NOT DETECTED Final   Streptococcus agalactiae NOT DETECTED NOT DETECTED Final   Streptococcus pneumoniae NOT DETECTED NOT DETECTED Final   Streptococcus pyogenes NOT DETECTED NOT DETECTED Final   Acinetobacter baumannii NOT DETECTED NOT DETECTED Final   Enterobacteriaceae species NOT DETECTED NOT DETECTED Final   Enterobacter cloacae complex NOT DETECTED NOT DETECTED Final   Escherichia coli NOT DETECTED NOT DETECTED Final   Klebsiella oxytoca NOT DETECTED NOT DETECTED Final   Klebsiella pneumoniae NOT DETECTED NOT DETECTED Final   Proteus species NOT DETECTED NOT DETECTED Final   Serratia marcescens NOT DETECTED NOT DETECTED Final   Haemophilus influenzae NOT DETECTED NOT DETECTED Final   Neisseria meningitidis NOT DETECTED NOT DETECTED Final   Pseudomonas aeruginosa NOT  DETECTED NOT DETECTED Final   Candida albicans NOT DETECTED NOT DETECTED Final   Candida glabrata NOT DETECTED NOT DETECTED Final   Candida krusei NOT DETECTED NOT DETECTED Final   Candida parapsilosis NOT DETECTED NOT DETECTED Final   Candida tropicalis NOT DETECTED NOT DETECTED Final  Culture, blood (Routine X 2) w Reflex to ID Panel     Status: None (Preliminary result)   Collection Time: 03/02/16  2:15 AM  Result Value Ref Range  Status   Specimen Description BLOOD LEFT HAND  Final   Special Requests IN PEDIATRIC BOTTLE 2CC  Final   Culture  Setup Time   Final    GRAM POSITIVE COCCI IN CLUSTERS IN PEDIATRIC BOTTLE CRITICAL VALUE NOTED.  VALUE IS CONSISTENT WITH PREVIOUSLY REPORTED AND CALLED VALUE.    Culture PENDING  Incomplete   Report Status PENDING  Incomplete  MRSA PCR Screening     Status: Abnormal   Collection Time: 03/02/16  8:34 AM  Result Value Ref Range Status   MRSA by PCR POSITIVE (A) NEGATIVE Final    Comment:        The GeneXpert MRSA Assay (FDA approved for NASAL specimens only), is one component of a comprehensive MRSA colonization surveillance program. It is not intended to diagnose MRSA infection nor to guide or monitor treatment for MRSA infections. RESULT CALLED TO, READ BACK BY AND VERIFIED WITH: RWendie Chess RN 11:35 03/02/16 ([email or web address])   Aerobic Culture (superficial specimen)     Status: None (Preliminary result)   Collection Time: 03/02/16  2:10 PM  Result Value Ref Range Status   Specimen Description WOUND  Final   Special Requests AVF  Final   Gram Stain NO WBC SEEN NO ORGANISMS SEEN   Final   Culture   Final    MODERATE STAPHYLOCOCCUS AUREUS SUSCEPTIBILITIES TO FOLLOW    Report Status PENDING  Incomplete    RADIOLOGY STUDIES/RESULTS: Dg Chest Port 1 View  Result Date: 03/03/2016 CLINICAL DATA:  60 y/o  F; central line placement. EXAM: PORTABLE CHEST 1 VIEW COMPARISON:  None. FINDINGS: Cardiac silhouette within normal limits given  projection and technique. Left central venous catheter tip projects over the cavoatrial junction. Clear lungs. No pleural effusion. No pneumothorax. Bones are unremarkable. IMPRESSION: No active disease. Electronically Signed   By: Kristine Garbe M.D.   On: 03/03/2016 04:26   Dg Fluoro Guide Cv Line-no Report  Result Date: 03/03/2016 There is no Radiologist interpretation  for this exam.    LOS: 1 day   Oren Binet, MD  [facility] Pager:[phone]  If 7PM-7AM, please contact night-coverage [email or web address] Password [email or web address] 03/03/2016, 11:10 AM

## 2016-03-03 NOTE — Progress Notes (Signed)
Dr. Oneida Alar at bedside suturing fistula to stop bleeding for right now. Plan is to go to OR STAT.

## 2016-03-03 NOTE — Progress Notes (Addendum)
RN paged stat because pt was bleeding profusely from her AV graft site to right upper arm. Pt here with cellulitis at graft site. Vascular surgery was consulted for ? placement/surgery to graft but stated to give abx first. However, tonight, bleeding occurred. NP stat to bedside. RRRN at bedside holding pressure to right upper extremity at graft site. Dsg has previously been reinforced and pressure has been held, but site continues to bleed. BP falling into the 60s but pt alert and oriented, just "dizzy". Bolus infusing.   NP removed dsg. 2 areas of profuse spurting of blood from RUE. Dsg replaced immediately and pressure continued. Pt started on Levophed in room due to low pressure. Pt has remained awake and alert.   NP called nephrology stat and vascular surgery stat and pt transferred stat to ICU, 2S. Vascular surgeon in route. Type and screen, H/H, PT/INR, PTT, 2U FFP and 2U PRBCs ordered urgently. Bolus infused until Levaphed started.  Upon arrival in ICU with Levaphed at 20 mcgs, BP 120s. Levaphed down to 15, then 10, then paused when BP in the 140s.  FFP and PRBCs infusing.   Vascular, Dr. Oneida Alar at bedside. Further plan per him.  NP attempted to call husband without success. NP did speak to aunt listed on chart and told her (with pt's permission) about tonight's events and possible emergent surgery. NP asked aunt to continue to try husband and gave aunt the phone number to the unit.  Dr. Oneida Alar placed sutures and bedside and will take pt urgently to OR. Will attempt to put new catheter in if possible. Dr. Oneida Alar discussed procedure with pt at bedside.  Pt hemodynamically stable at this time.    KJKG, NP  Update: [phone]: Pt hemodynamically stable since surgery. No bleeding noted. See op note. Last Hgb in the 8s. Received 2U PRBCs and 2U FFP prior to OR. Off pressors since prior to OR.  Will transfer pt back to tele, preferably, 6E.  KJKG, NP [facility]

## 2016-03-03 NOTE — Progress Notes (Signed)
INFECTIOUS DISEASE PROGRESS NOTE  ID: Rachael George is a 60 y.o. female with  Principal Problem:   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HCC) Active Problems:   End stage renal disease (HCC)   Anemia in chronic kidney disease  Subjective: S/p HD, feeling well. Has had fevers.  Taking liquids, would like solids.   Abtx:  Anti-infectives    Start     Dose/Rate Route Frequency Ordered Stop   03/03/16 0600  vancomycin (VANCOCIN) IVPB 1000 mg/200 mL premix  Status:  Discontinued     1,000 mg 200 mL/hr over 60 Minutes Intravenous On call to O.R. 03/02/16 1554 03/02/16 1602   03/02/16 2200  ceFEPIme (MAXIPIME) 1 g in dextrose 5 % 50 mL IVPB  Status:  Discontinued     1 g 100 mL/hr over 30 Minutes Intravenous Every 24 hours 03/02/16 0415 03/02/16 1922   03/02/16 1200  vancomycin (VANCOCIN) IVPB 750 mg/150 ml premix     750 mg 150 mL/hr over 60 Minutes Intravenous Every M-W-F (Hemodialysis) 03/02/16 0415     03/02/16 0430  vancomycin (VANCOCIN) IVPB 750 mg/150 ml premix     750 mg 150 mL/hr over 60 Minutes Intravenous  Once 03/02/16 0415 03/02/16 0621   03/02/16 0430  ceFEPIme (MAXIPIME) 1 g in dextrose 5 % 50 mL IVPB     1 g 100 mL/hr over 30 Minutes Intravenous  Once 03/02/16 0415 03/02/16 0549   03/02/16 0045  vancomycin (VANCOCIN) IVPB 1000 mg/200 mL premix     1,000 mg 200 mL/hr over 60 Minutes Intravenous  Once 03/02/16 0032 03/02/16 0319      Medications:  Scheduled: . allopurinol  100 mg Oral BID  . atorvastatin  80 mg Oral Daily  . [START ON 03/04/2016] calcitRIOL  2 mcg Oral Q M,W,F-HD  . Chlorhexidine Gluconate Cloth  6 each Topical Q0600  . cinacalcet  30 mg Oral Q breakfast  . famotidine  20 mg Oral QHS  . multivitamin  1 tablet Oral QHS  . mupirocin ointment  1 application Nasal BID  . pantoprazole  40 mg Oral BID  . sevelamer carbonate  1,600 mg Oral TID WC  . vancomycin  750 mg Intravenous Q M,W,F-HD    Objective: Vital signs in last 24 hours: Temp:   [98.1 F (36.7 C)-103.1 F (39.5 C)] 98.8 F (37.1 C) (12/12 0436) Pulse Rate:  [87-117] 87 (12/12 0900) Resp:  [12-31] 18 (12/12 0900) BP: (59-169)/(46-125) 150/125 (12/12 0900) SpO2:  [97 %-100 %] 100 % (12/12 0900)   General appearance: alert, cooperative and no distress Resp: clear to auscultation bilaterally Chest wall: no tenderness, L chest HD line. clean.  Cardio: regular rate and rhythm GI: normal findings: bowel sounds normal and soft, non-tender Extremities: RUE dressed.   Lab Results  Recent Labs  03/02/16 0836 03/03/16 0053 03/03/16 0454  WBC 7.3  --  9.6  HGB 11.7* 8.3* 8.6*  HCT 36.2 25.3* 25.8*  NA 134*  --  135  K 6.0*  --  4.7  CL 99*  --  101  CO2 20*  --  21*  BUN 70*  --  72*  CREATININE 10.54*  --  11.00*   Liver Panel No results for input(s): PROT, ALBUMIN, AST, ALT, ALKPHOS, BILITOT, BILIDIR, IBILI in the last 72 hours. Sedimentation Rate No results for input(s): ESRSEDRATE in the last 72 hours. C-Reactive Protein No results for input(s): CRP in the last 72 hours.  Microbiology: Recent Results (from the past  240 hour(s))  Culture, blood (Routine X 2) w Reflex to ID Panel     Status: Abnormal (Preliminary result)   Collection Time: 03/02/16  1:10 AM  Result Value Ref Range Status   Specimen Description BLOOD LEFT ARM  Final   Special Requests IN PEDIATRIC BOTTLE 4CC  Final   Culture  Setup Time   Final    GRAM POSITIVE COCCI IN CLUSTERS IN PEDIATRIC BOTTLE CRITICAL RESULT CALLED TO, READ BACK BY AND VERIFIED WITH: LPablo Lawrence.D. 16:10 03/02/16 ([email or web address])    Culture (A)  Final    STAPHYLOCOCCUS AUREUS SUSCEPTIBILITIES TO FOLLOW    Report Status PENDING  Incomplete  Blood Culture ID Panel (Reflexed)     Status: Abnormal   Collection Time: 03/02/16  1:10 AM  Result Value Ref Range Status   Enterococcus species NOT DETECTED NOT DETECTED Final   Listeria monocytogenes NOT DETECTED NOT DETECTED Final   Staphylococcus species DETECTED  (A) NOT DETECTED Final    Comment: CRITICAL RESULT CALLED TO, READ BACK BY AND VERIFIED WITH: LPablo Lawrence.D. 16:10 03/02/16 ([email or web address])    Staphylococcus aureus DETECTED (A) NOT DETECTED Final    Comment: CRITICAL RESULT CALLED TO, READ BACK BY AND VERIFIED WITH: LPablo Lawrence.D. 16:10 03/02/16 ([email or web address])    Methicillin resistance DETECTED (A) NOT DETECTED Final    Comment: CRITICAL RESULT CALLED TO, READ BACK BY AND VERIFIED WITH: LPablo Lawrence.D. 16:10 03/02/16 ([email or web address])    Streptococcus species NOT DETECTED NOT DETECTED Final   Streptococcus agalactiae NOT DETECTED NOT DETECTED Final   Streptococcus pneumoniae NOT DETECTED NOT DETECTED Final   Streptococcus pyogenes NOT DETECTED NOT DETECTED Final   Acinetobacter baumannii NOT DETECTED NOT DETECTED Final   Enterobacteriaceae species NOT DETECTED NOT DETECTED Final   Enterobacter cloacae complex NOT DETECTED NOT DETECTED Final   Escherichia coli NOT DETECTED NOT DETECTED Final   Klebsiella oxytoca NOT DETECTED NOT DETECTED Final   Klebsiella pneumoniae NOT DETECTED NOT DETECTED Final   Proteus species NOT DETECTED NOT DETECTED Final   Serratia marcescens NOT DETECTED NOT DETECTED Final   Haemophilus influenzae NOT DETECTED NOT DETECTED Final   Neisseria meningitidis NOT DETECTED NOT DETECTED Final   Pseudomonas aeruginosa NOT DETECTED NOT DETECTED Final   Candida albicans NOT DETECTED NOT DETECTED Final   Candida glabrata NOT DETECTED NOT DETECTED Final   Candida krusei NOT DETECTED NOT DETECTED Final   Candida parapsilosis NOT DETECTED NOT DETECTED Final   Candida tropicalis NOT DETECTED NOT DETECTED Final  Culture, blood (Routine X 2) w Reflex to ID Panel     Status: None (Preliminary result)   Collection Time: 03/02/16  2:15 AM  Result Value Ref Range Status   Specimen Description BLOOD LEFT HAND  Final   Special Requests IN PEDIATRIC BOTTLE 2CC  Final   Culture  Setup Time   Final    GRAM POSITIVE COCCI IN  CLUSTERS IN PEDIATRIC BOTTLE CRITICAL VALUE NOTED.  VALUE IS CONSISTENT WITH PREVIOUSLY REPORTED AND CALLED VALUE.    Culture PENDING  Incomplete   Report Status PENDING  Incomplete  MRSA PCR Screening     Status: Abnormal   Collection Time: 03/02/16  8:34 AM  Result Value Ref Range Status   MRSA by PCR POSITIVE (A) NEGATIVE Final    Comment:        The GeneXpert MRSA Assay (FDA approved for NASAL specimens only), is one component of a comprehensive MRSA colonization surveillance program. It is not intended to diagnose MRSA  infection nor to guide or monitor treatment for MRSA infections. RESULT CALLED TO, READ BACK BY AND VERIFIED WITH: Janina Mayo RN 11:35 03/02/16 ([email or web address])   Aerobic Culture (superficial specimen)     Status: None (Preliminary result)   Collection Time: 03/02/16  2:10 PM  Result Value Ref Range Status   Specimen Description WOUND  Final   Special Requests AVF  Final   Gram Stain NO WBC SEEN NO ORGANISMS SEEN   Final   Culture   Final    MODERATE STAPHYLOCOCCUS AUREUS SUSCEPTIBILITIES TO FOLLOW    Report Status PENDING  Incomplete    Studies/Results: Dg Chest Port 1 View  Result Date: 03/03/2016 CLINICAL DATA:  60 y/o  F; central line placement. EXAM: PORTABLE CHEST 1 VIEW COMPARISON:  None. FINDINGS: Cardiac silhouette within normal limits given projection and technique. Left central venous catheter tip projects over the cavoatrial junction. Clear lungs. No pleural effusion. No pneumothorax. Bones are unremarkable. IMPRESSION: No active disease. Electronically Signed   By: Kristine Garbe M.D.   On: 03/03/2016 04:26   Dg Fluoro Guide Cv Line-no Report  Result Date: 03/03/2016 There is no Radiologist interpretation  for this exam.    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  No further fever documented.    Total days of  antibiotics: 2 vanco         [name] Infectious Diseases (pager) [phone] [email or web address] 03/03/2016, 9:47 AM  LOS: 1 day

## 2016-03-03 NOTE — Anesthesia Procedure Notes (Signed)
Procedure Name: MAC Date/Time: 03/03/2016 2:20 AM Performed by: Babs Bertin Pre-anesthesia Checklist: Patient identified, Emergency Drugs available, Suction available, Patient being monitored and Timeout performed Patient Re-evaluated:Patient Re-evaluated prior to inductionOxygen Delivery Method: Simple face mask

## 2016-03-03 NOTE — Progress Notes (Signed)
Report received from PACU

## 2016-03-03 NOTE — Progress Notes (Signed)
Patient called and stated she is bleeding. Came to see patient and noted patient  profusedly bled from her AVG. Her bed was saturated with blood and blood was also noted on the floor. Patient remained alert and oriented. " I just feel dizzy." Pressure held to the right arm till help arrived.  0014.  BP 75/52. Bolus of normal saline infusing at present. RRRN came to assist with patient holding pressure while waiting for the NP to arrive. 2 pressure dressings applied to help stop bleeding. NP in to assess patient and called the surgeon.   0020 BP 59/46. Levophed started and patient got ready for transfer.  Patient remained alert and oriented during the whole process.

## 2016-03-03 NOTE — Progress Notes (Signed)
Pt received 2units pRBCs and 2units FFP while in SICU.

## 2016-03-03 NOTE — Progress Notes (Signed)
Pt's husband updated over the phone. Michela Pitcher he will come in the morning to visit. Gave number to SICU.

## 2016-03-03 NOTE — Progress Notes (Signed)
Patient's aunt, Willaim Rayas, updated via phone.  Informed of patient's condition and room number.  Aunt instructed to call patient's husband and inform him as well.

## 2016-03-03 NOTE — Transfer of Care (Signed)
Immediate Anesthesia Transfer of Care Note  Patient: Rachael George  Procedure(s) Performed: Procedure(s): INSERTION OF DIALYSIS CATHETER LIGATION OF ARTERIOVENOUS  FISTULA  Patient Location: PACU  Anesthesia Type:MAC  Level of Consciousness: awake, alert  and oriented  Airway & Oxygen Therapy: Patient Spontanous Breathing  Post-op Assessment: Report given to RN and Post -op Vital signs reviewed and stable  Post vital signs: Reviewed and stable  Last Vitals:  Vitals:   03/03/16 0200 03/03/16 0205  BP: (!) 150/89   Pulse: 100 100  Resp: 15 17  Temp:      Last Pain:  Vitals:   03/03/16 0110  TempSrc: Oral  PainSc:       Patients Stated Pain Goal: 0 (66/59/93 5701)  Complications: No apparent anesthesia complications

## 2016-03-03 NOTE — Progress Notes (Signed)
Pt transported to Short Stay rm. 43 by RN. 2nd unit of pRBC still infusing on arrival to Short Stay. Pt neuro intact, no further bleeding s/p Dr. Nona Dell intervention at SICU bedside. No concerns, handoff report given to RN at Short Stay bedside.

## 2016-03-03 NOTE — Progress Notes (Signed)
[facility] Progress Note   Subjective: on HD now  Vitals:   03/03/16 1500 03/03/16 1530 03/03/16 1600 03/03/16 1615  BP: 137/74 (!) 157/86 (!) 159/80 (!) 154/85  Pulse: 87 92 90 93  Resp: 16 18 15 13   Temp:      TempSrc:      SpO2:      Weight:      Height:        Inpatient medications: . allopurinol  100 mg Oral BID  . atorvastatin  80 mg Oral Daily  . [START ON 03/04/2016] calcitRIOL  2 mcg Oral Q M,W,F-HD  . Chlorhexidine Gluconate Cloth  6 each Topical Q0600  . cinacalcet  30 mg Oral Q breakfast  . famotidine  20 mg Oral QHS  . multivitamin  1 tablet Oral QHS  . mupirocin ointment  1 application Nasal BID  . pantoprazole  40 mg Oral BID  . sevelamer carbonate  1,600 mg Oral TID WC  . vancomycin  750 mg Intravenous Q M,W,F-HD   . norepinephrine (LEVOPHED) Adult infusion Stopped (03/03/16 0137)   acetaminophen **OR** acetaminophen, albuterol, morphine injection, ondansetron **OR** ondansetron (ZOFRAN) IV, oxyCODONE, oxyCODONE-acetaminophen  Exam: General: Well developed, well nourished Neck: Supple. JVD not elevated. Lungs: Clear bilaterally to auscultation without wheezes, rales, or rhonchi Heart: RRR with S1 S2. No murmurs, rubs, or gallops appreciated. Abdomen: Soft, non-tender, non-distended with normoactive bowel sounds Lower extremities:without edema or ischemic changes Dialysis Access: RUA AVF wrapped, L IJ TDC  Dialysis Orders: [facility] MWF  4h  81kg  2/2 bath  Hep 7000   L IJ TDC (new this admit)/ R AVF (ligated) Calcitriol 2.0 mcg PO q treatment (last PTH 407 01/29/16)  BMD medications:  Sevelamer 800 mg 4 tablets PO TID w/meals Sensipar 30 mg PO daily   Recent labs: HGB 11.9 (02/26/16) Fe 55 Tsat 25% 01/29/16   Assessment: 1.  Infected RUA AVF: Vanc/cefepime, s/p ligation AVF and resection aneurysm/ necrotic skin. BLood cx's + for GPC.  2. Hyperkalemia: better 3. ESRD - off sched, HD today 4.  Hypertension/volume  - No  antihypertensive meds on OP med list. UFG to OP EDW 81 kg.  5.  Anemia  - HGB 11.7. No OP ESA. None needed now. Follow HGB.  6.  Metabolic bone disease -  Cont binders/VDRA 7.  Nutrition - Renal diet w/fld restrictions/nepro/renal vits.    Plan - HD today   Kelly Splinter MD [facility] pager [phone]   03/03/2016, 4:49 PM    Recent Labs Lab 03/02/16 0105 03/02/16 0836 03/03/16 0454  NA 136 134* 135  K 5.5* 6.0* 4.7  CL 100* 99* 101  CO2 21* 20* 21*  GLUCOSE 100* 103* 133*  BUN 68* 70* 72*  CREATININE 10.03* 10.54* 11.00*  CALCIUM 9.1 8.9 7.8*   No results for input(s): AST, ALT, ALKPHOS, BILITOT, PROT, ALBUMIN in the last 168 hours.  Recent Labs Lab 03/02/16 0105 03/02/16 0836 03/03/16 0053 03/03/16 0454  WBC 8.3 7.3  --  9.6  NEUTROABS 7.0  --   --   --   HGB 11.6* 11.7* 8.3* 8.6*  HCT 35.4* 36.2 25.3* 25.8*  MCV 80.6 80.8  --  81.6  PLT 121* 104*  --  73*   Iron/TIBC/Ferritin/ %Sat    Component Value Date/Time   IRON 46 10/04/2014 1016   TIBC 205 (L) 10/04/2014 1016   FERRITIN 1,168 (H) 10/04/2014 1016   IRONPCTSAT 22 10/04/2014 1016

## 2016-03-03 NOTE — Progress Notes (Signed)
Pt transferred to [place] via wheelchair with all belongings including cell phone and charger. VSS. Assessment unchanged. Pt assisted to chair with 1 person assist, telemetry verified, receiving RN at bedside. Pt sitting up comfortably in chair. Patient updated husband on her location via cell phone.

## 2016-03-03 NOTE — Anesthesia Preprocedure Evaluation (Signed)
Anesthesia Evaluation  Patient identified by MRN, date of birth, ID band Patient awake    Reviewed: Allergy & Precautions, H&P , NPO status , Patient's Chart, lab work & pertinent test results, reviewed documented beta blocker date and time   Airway Mallampati: II  TM Distance: >3 FB Neck ROM: Full    Dental no notable dental hx. (+) Teeth Intact, Dental Advisory Given   Pulmonary asthma , sleep apnea ,    Pulmonary exam normal breath sounds clear to auscultation       Cardiovascular hypertension, Pt. on medications and Pt. on home beta blockers  Rhythm:Regular Rate:Normal     Neuro/Psych negative neurological ROS  negative psych ROS   GI/Hepatic Neg liver ROS, GERD  Medicated and Controlled,  Endo/Other  negative endocrine ROS  Renal/GU CRFRenal disease  negative genitourinary   Musculoskeletal  (+) Arthritis , Rheumatoid disorders,    Abdominal   Peds  Hematology negative hematology ROS (+) anemia ,   Anesthesia Other Findings   Reproductive/Obstetrics negative OB ROS                             Anesthesia Physical Anesthesia Plan  ASA: III and emergent  Anesthesia Plan: MAC   Post-op Pain Management:    Induction: Intravenous  Airway Management Planned: Nasal Cannula, Simple Face Mask and Natural Airway  Additional Equipment: None  Intra-op Plan:   Post-operative Plan:   Informed Consent: I have reviewed the patients History and Physical, chart, labs and discussed the procedure including the risks, benefits and alternatives for the proposed anesthesia with the patient or authorized representative who has indicated his/her understanding and acceptance.   Dental advisory given  Plan Discussed with: CRNA and Surgeon  Anesthesia Plan Comments:         Anesthesia Quick Evaluation

## 2016-03-03 NOTE — Consult Note (Signed)
Called to see pt for bleeding from right arm AV fistula.  Pt had raised cellulitic area upper portion of fistula which began bleeding this evening.  Apparently significant blood loss with hypotension with BP in 50s.  Tourniquet was placed on arm to control bleeding.    She was concious and on Levophed with BP 130s on my arrival Tourniquet deflated and control of fistula maintained with direct pressure 2 figure of 8 prolene sutures placed to achieve hemostasis.  Pt has an area of necrotic skin extending about 2 cm around the fistula opening She will need to go to OR for definitive control of bleeding. Most likely will need ligation of fistula given presence of infection and catheter   Ruta Hinds, MD [facility]: [phone] Pager: [phone]

## 2016-03-04 ENCOUNTER — Inpatient Hospital Stay (HOSPITAL_COMMUNITY): Payer: Medicare Other

## 2016-03-04 ENCOUNTER — Encounter (HOSPITAL_COMMUNITY): Payer: Self-pay | Admitting: Vascular Surgery

## 2016-03-04 DIAGNOSIS — T827XXD Infection and inflammatory reaction due to other cardiac and vascular devices, implants and grafts, subsequent encounter: Secondary | ICD-10-CM

## 2016-03-04 DIAGNOSIS — N186 End stage renal disease: Secondary | ICD-10-CM

## 2016-03-04 DIAGNOSIS — R571 Hypovolemic shock: Secondary | ICD-10-CM

## 2016-03-04 DIAGNOSIS — R7881 Bacteremia: Secondary | ICD-10-CM

## 2016-03-04 LAB — TYPE AND SCREEN
ABO/RH(D): B POS
Antibody Screen: NEGATIVE
Unit division: 0
Unit division: 0
Unit division: 0
Unit division: 0
Unit division: 0
Unit division: 0

## 2016-03-04 LAB — CBC
HCT: 26.8 % — ABNORMAL LOW (ref 36.0–46.0)
Hemoglobin: 9.1 g/dL — ABNORMAL LOW (ref 12.0–15.0)
MCH: 27 pg (ref 26.0–34.0)
MCHC: 34 g/dL (ref 30.0–36.0)
MCV: 79.5 fL (ref 78.0–100.0)
Platelets: 75 10*3/uL — ABNORMAL LOW (ref 150–400)
RBC: 3.37 MIL/uL — ABNORMAL LOW (ref 3.87–5.11)
RDW: 16.1 % — ABNORMAL HIGH (ref 11.5–15.5)
WBC: 6.1 10*3/uL (ref 4.0–10.5)

## 2016-03-04 LAB — CULTURE, BLOOD (ROUTINE X 2)

## 2016-03-04 LAB — RENAL FUNCTION PANEL
Albumin: 2.8 g/dL — ABNORMAL LOW (ref 3.5–5.0)
Anion gap: 10 (ref 5–15)
BUN: 37 mg/dL — ABNORMAL HIGH (ref 6–20)
CO2: 28 mmol/L (ref 22–32)
Calcium: 8.5 mg/dL — ABNORMAL LOW (ref 8.9–10.3)
Chloride: 98 mmol/L — ABNORMAL LOW (ref 101–111)
Creatinine, Ser: 7.75 mg/dL — ABNORMAL HIGH (ref 0.44–1.00)
GFR calc Af Amer: 6 mL/min — ABNORMAL LOW (ref >60)
GFR calc non Af Amer: 5 mL/min — ABNORMAL LOW (ref >60)
Glucose, Bld: 97 mg/dL (ref 65–99)
Phosphorus: 5.2 mg/dL — ABNORMAL HIGH (ref 2.5–4.6)
Potassium: 3.9 mmol/L (ref 3.5–5.1)
Sodium: 136 mmol/L (ref 135–145)

## 2016-03-04 LAB — ECHOCARDIOGRAM COMPLETE
Height: 62 in
Weight: 2915.2 oz

## 2016-03-04 LAB — PREPARE FRESH FROZEN PLASMA
Unit division: 0
Unit division: 0

## 2016-03-04 MED ORDER — VANCOMYCIN HCL IN DEXTROSE 750-5 MG/150ML-% IV SOLN
750.0000 mg | INTRAVENOUS | Status: AC
  Administered 2016-03-05: 750 mg via INTRAVENOUS
  Filled 2016-03-04 (×2): qty 150

## 2016-03-04 MED ORDER — CALCITRIOL 0.5 MCG PO CAPS
2.0000 ug | ORAL_CAPSULE | ORAL | Status: DC
  Administered 2016-03-04 – 2016-03-06 (×2): 2 ug via ORAL
  Filled 2016-03-04 (×2): qty 4

## 2016-03-04 NOTE — Care Management Important Message (Signed)
Important Message  Patient Details  Name: Rachael [facility] MRN: [id number] Date of Birth: 21-May-1955   Medicare Important Message Given:  Yes    Jhon Mallozzi, Rory Percy, RN 03/04/2016, 11:26 AM

## 2016-03-04 NOTE — Progress Notes (Signed)
Echocardiogram 2D Echocardiogram has been performed.  Aggie Cosier 03/04/2016, 10:51 AM

## 2016-03-04 NOTE — Progress Notes (Addendum)
  Progress Note    03/04/2016 8:30 AM 1 Day Post-Op  Subjective:  Soreness at the catheter site  Tm 100 now afebrile HR 90's-100's NSR 308'M-578'I systolic 69% RA  Vitals:   03/03/16 2131 03/04/16 0525  BP: (!) 162/77 117/69  Pulse: 95 (!) 101  Resp: 16 14  Temp: 98.3 F (36.8 C) 98.8 F (37.1 C)    Physical Exam: Cardiac:  regular Lungs:  Non labored Incisions:   Clean with nylon sutures in place.  Mild serosanguinous drainage from the mid portion of the incision.  Hematoma/seroma proximal to incision.  Extremities:  3+ right radial pulse   CBC    Component Value Date/Time   WBC 9.6 03/03/2016 0454   RBC 3.16 (L) 03/03/2016 0454   HGB 8.6 (L) 03/03/2016 0454   HCT 25.8 (L) 03/03/2016 0454   PLT 73 (L) 03/03/2016 0454   MCV 81.6 03/03/2016 0454   MCH 27.2 03/03/2016 0454   MCHC 33.3 03/03/2016 0454   RDW 16.0 (H) 03/03/2016 0454   LYMPHSABS 0.8 03/02/2016 0105   MONOABS 0.4 03/02/2016 0105   EOSABS 0.0 03/02/2016 0105   BASOSABS 0.0 03/02/2016 0105    BMET    Component Value Date/Time   NA 135 03/03/2016 0454   K 4.7 03/03/2016 0454   CL 101 03/03/2016 0454   CO2 21 (L) 03/03/2016 0454   GLUCOSE 133 (H) 03/03/2016 0454   BUN 72 (H) 03/03/2016 0454   CREATININE 11.00 (H) 03/03/2016 0454   CREATININE 3.71 (H) 10/04/2014 1016   CALCIUM 7.8 (L) 03/03/2016 0454   GFRNONAA 3 (L) 03/03/2016 0454   GFRNONAA 13 (L) 10/04/2014 1016   GFRAA 4 (L) 03/03/2016 0454   GFRAA 15 (L) 10/04/2014 1016    INR    Component Value Date/Time   INR 1.19 03/03/2016 0053     Intake/Output Summary (Last 24 hours) at 03/04/16 0830 Last data filed at 03/04/16 0607  Gross per 24 hour  Intake              600 ml  Output             2659 ml  Net            -2059 ml     Assessment:  60 y.o. female is s/p:  Ligation right arm AV fistula, ultrasound neck, insertion palindrome catheter left internal jugular vein   1 Day Post-Op   Plan: -pt doing well this am.   Incision continues to look good with minimal serosanguinous drainage from the mid portion of the incision.  There is a moderate size hematoma/seroma proximal to the incision, which is soft.  Low grade temp last evening but now afebrile.  + blood cx with GPC in clusters.  Normal WBC.   Abx per primary team.   -hgb stable (slightly improved from yesterday) -DVT prophylaxis:  SQ heparin   Rachael Locket, PA-C [facility] [phone] 03/04/2016 8:30 AM   Agree with above.  Continue with dry dressing to right upper arm.  Currently with minimal drainage.  We'll need to keep this area dry.  Do not recommend surgical drainage at this time.  The patient will also need repeat vein mapping of her left arm to discuss new dialysis options.  I would like for her to recover from this in-stent and plan on racing a new access, which will likely be a graft, Month    Rachael George

## 2016-03-04 NOTE — Progress Notes (Signed)
Pharmacy Antibiotic Note  Rachael George is a 60 y.o. female admitted on 03/01/2016 with cellulitis at site of right arm AV fistula.  Pharmacy has been consulted for vancomycin dosing. Dialysis originally scheduled MWF however did not go to dialysis 12/11 as was being held until seen by [facility] per nephrology note.    Early 12/12 AM, patient developed bleeding at site of necrotic tissue around AV fistula and was taken to OR for ligation of AV fistula and insertion of palindrome catheter (left IJ vein).  12/12 PM, full HD session completed. BFR 300 x1hr, 350 x3 hrs. (Off schedule)   Planned 12/14, HD session (off schedule)  Today is D#3 vancomycin for infected AV fistula. WBC down and wnl, AFeb, last lactic acid on 12/11 1.77.    Plan:  -Vanc 750mg  w/ HD x1 ordered for 12/14 -F/u return to HD schedule MWF, C&S (original vancomycin q MWF dc'd) -Vanc level as needed  -Monitor s/sx clinical improvement, CBC, TMax   Height: 5\' 2"  (157.5 cm) Weight: 182 lb 3.2 oz (82.6 kg) IBW/kg (Calculated) : 50.1  Temp (24hrs), Avg:99.1 F (37.3 C), Min:98.3 F (36.8 C), Max:100 F (37.8 C)   Recent Labs Lab 03/02/16 0105 03/02/16 0116 03/02/16 0836 03/03/16 0454 03/04/16 0857 03/04/16 0915  WBC 8.3  --  7.3 9.6  --  6.1  CREATININE 10.03*  --  10.54* 11.00* 7.75*  --   LATICACIDVEN  --  1.77  --   --   --   --     Estimated Creatinine Clearance: 7.8 mL/min (by C-G formula based on SCr of 7.75 mg/dL (H)).    Allergies  Allergen Reactions  . Atacand [Candesartan] Anaphylaxis  . Lisinopril Anaphylaxis  . Motrin [Ibuprofen] Anaphylaxis  . Nsaids Anaphylaxis  . Tolmetin Anaphylaxis  . Penicillins Rash    Has patient had a PCN reaction causing immediate rash, facial/tongue/throat swelling, SOB or lightheadedness with hypotension: No Has patient had a PCN reaction causing severe rash involving mucus membranes or skin necrosis: No Has patient had a PCN reaction that required hospitalization  No Has patient had a PCN reaction occurring within the last 10 years: No If all of the above answers are "NO", then may proceed with Cephalosporin use.     Antimicrobials this admission:  Cefepime 12/11 >> 12/11 per ID Vanc 12/11 >>  *12/11 loaded with 1750mg  total (1g + 750mg  doses) *12/12 750 mg x1 after HD (off schedule)    Dose adjustments this admission:  n/a  Microbiology results:  12/11 BCx: 2/2 GPC in clusters - BCID MRSA 12/11 wound culture: mod staph aureus  12/11 MRSA PCR: pos   Thank you for allowing pharmacy to be a part of this patient's care.  Carlean Jews, Pharm.D. PGY1 Pharmacy Resident 12/13/20171:14 PM Pager [phone]

## 2016-03-04 NOTE — Progress Notes (Signed)
INFECTIOUS DISEASE PROGRESS NOTE  ID: Rachael George is a 60 y.o. female with  Principal Problem:   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HCC) Active Problems:   End stage renal disease (HCC)   Anemia in chronic kidney disease   Acute blood loss anemia   Hypovolemic shock (HCC)   Staphylococcus aureus bacteremia  Subjective: No complaints.  Has had yellow d/c from wound  Abtx:  Anti-infectives    Start     Dose/Rate Route Frequency Ordered Stop   03/05/16 1200  vancomycin (VANCOCIN) IVPB 750 mg/150 ml premix     750 mg 150 mL/hr over 60 Minutes Intravenous Every T-Th-Sa (Hemodialysis) 03/04/16 1146 03/07/16 1159   03/03/16 1415  vancomycin (VANCOCIN) IVPB 750 mg/150 ml premix     750 mg 150 mL/hr over 60 Minutes Intravenous Every T-Th-Sa (Hemodialysis) 03/03/16 1412 03/03/16 1612   03/03/16 1200  vancomycin (VANCOCIN) IVPB 750 mg/150 ml premix  Status:  Discontinued     750 mg 150 mL/hr over 60 Minutes Intravenous  Once 03/03/16 1121 03/03/16 1412   03/03/16 0600  vancomycin (VANCOCIN) IVPB 1000 mg/200 mL premix  Status:  Discontinued     1,000 mg 200 mL/hr over 60 Minutes Intravenous On call to O.R. 03/02/16 1554 03/02/16 1602   03/02/16 2200  ceFEPIme (MAXIPIME) 1 g in dextrose 5 % 50 mL IVPB  Status:  Discontinued     1 g 100 mL/hr over 30 Minutes Intravenous Every 24 hours 03/02/16 0415 03/02/16 1922   03/02/16 1200  vancomycin (VANCOCIN) IVPB 750 mg/150 ml premix  Status:  Discontinued     750 mg 150 mL/hr over 60 Minutes Intravenous Every M-W-F (Hemodialysis) 03/02/16 0415 03/04/16 1148   03/02/16 0430  vancomycin (VANCOCIN) IVPB 750 mg/150 ml premix     750 mg 150 mL/hr over 60 Minutes Intravenous  Once 03/02/16 0415 03/02/16 0621   03/02/16 0430  ceFEPIme (MAXIPIME) 1 g in dextrose 5 % 50 mL IVPB     1 g 100 mL/hr over 30 Minutes Intravenous  Once 03/02/16 0415 03/02/16 0549   03/02/16 0045  vancomycin (VANCOCIN) IVPB 1000 mg/200 mL premix     1,000 mg 200  mL/hr over 60 Minutes Intravenous  Once 03/02/16 0032 03/02/16 0319      Medications:  Scheduled: . allopurinol  100 mg Oral BID  . atorvastatin  80 mg Oral Daily  . calcitRIOL  2 mcg Oral Q M,W,F  . Chlorhexidine Gluconate Cloth  6 each Topical Q0600  . cinacalcet  30 mg Oral Q breakfast  . famotidine  20 mg Oral QHS  . multivitamin  1 tablet Oral QHS  . mupirocin ointment  1 application Nasal BID  . pantoprazole  40 mg Oral BID  . sevelamer carbonate  1,600 mg Oral TID WC  . [START ON 03/05/2016] vancomycin  750 mg Intravenous Q T,Th,Sa-HD    Objective: Vital signs in last 24 hours: Temp:  [98.3 F (36.8 C)-100 F (37.8 C)] 98.6 F (37 C) (12/13 0931) Pulse Rate:  [93-103] 101 (12/13 0931) Resp:  [13-18] 16 (12/13 0931) BP: (117-162)/(69-92) 125/75 (12/13 0931) SpO2:  [99 %-100 %] 99 % (12/13 0931) Weight:  [82.6 kg (182 lb 3.2 oz)-82.8 kg (182 lb 8.7 oz)] 82.6 kg (182 lb 3.2 oz) (12/12 2131)   General appearance: alert, cooperative and no distress Resp: clear to auscultation bilaterally Cardio: regular rate and rhythm GI: normal findings: bowel sounds normal and soft, non-tender Extremities: RUE dressed. L chest HD  line.   Lab Results  Recent Labs  03/03/16 0454 03/04/16 0857 03/04/16 0915  WBC 9.6  --  6.1  HGB 8.6*  --  9.1*  HCT 25.8*  --  26.8*  NA 135 136  --   K 4.7 3.9  --   CL 101 98*  --   CO2 21* 28  --   BUN 72* 37*  --   CREATININE 11.00* 7.75*  --    Liver Panel  Recent Labs  03/04/16 0857  ALBUMIN 2.8*   Sedimentation Rate No results for input(s): ESRSEDRATE in the last 72 hours. C-Reactive Protein No results for input(s): CRP in the last 72 hours.  Microbiology: Recent Results (from the past 240 hour(s))  Culture, blood (Routine X 2) w Reflex to ID Panel     Status: Abnormal   Collection Time: 03/02/16  1:10 AM  Result Value Ref Range Status   Specimen Description BLOOD LEFT ARM  Final   Special Requests IN PEDIATRIC BOTTLE  4CC  Final   Culture  Setup Time   Final    GRAM POSITIVE COCCI IN CLUSTERS IN PEDIATRIC BOTTLE CRITICAL RESULT CALLED TO, READ BACK BY AND VERIFIED WITH: LPablo Lawrence.D. 16:10 03/02/16 ([email or web address])    Culture METHICILLIN RESISTANT STAPHYLOCOCCUS AUREUS (A)  Final   Report Status 03/04/2016 FINAL  Final   Organism ID, Bacteria METHICILLIN RESISTANT STAPHYLOCOCCUS AUREUS  Final      Susceptibility   Methicillin resistant staphylococcus aureus - MIC*    CIPROFLOXACIN >=8 RESISTANT Resistant     ERYTHROMYCIN >=8 RESISTANT Resistant     GENTAMICIN <=0.5 SENSITIVE Sensitive     OXACILLIN >=4 RESISTANT Resistant     TETRACYCLINE <=1 SENSITIVE Sensitive     VANCOMYCIN <=0.5 SENSITIVE Sensitive     TRIMETH/SULFA <=10 SENSITIVE Sensitive     CLINDAMYCIN <=0.25 SENSITIVE Sensitive     RIFAMPIN <=0.5 SENSITIVE Sensitive     Inducible Clindamycin NEGATIVE Sensitive     * METHICILLIN RESISTANT STAPHYLOCOCCUS AUREUS  Blood Culture ID Panel (Reflexed)     Status: Abnormal   Collection Time: 03/02/16  1:10 AM  Result Value Ref Range Status   Enterococcus species NOT DETECTED NOT DETECTED Final   Listeria monocytogenes NOT DETECTED NOT DETECTED Final   Staphylococcus species DETECTED (A) NOT DETECTED Final    Comment: CRITICAL RESULT CALLED TO, READ BACK BY AND VERIFIED WITH: LPablo Lawrence.D. 16:10 03/02/16 (wilsonm)    Staphylococcus aureus DETECTED (A) NOT DETECTED Final    Comment: CRITICAL RESULT CALLED TO, READ BACK BY AND VERIFIED WITH: LPablo Lawrence.D. 16:10 03/02/16 (wilsonm)    Methicillin resistance DETECTED (A) NOT DETECTED Final    Comment: CRITICAL RESULT CALLED TO, READ BACK BY AND VERIFIED WITH: LPablo Lawrence.D. 16:10 03/02/16 (wilsonm)    Streptococcus species NOT DETECTED NOT DETECTED Final   Streptococcus agalactiae NOT DETECTED NOT DETECTED Final   Streptococcus pneumoniae NOT DETECTED NOT DETECTED Final   Streptococcus pyogenes NOT DETECTED NOT DETECTED Final    Acinetobacter baumannii NOT DETECTED NOT DETECTED Final   Enterobacteriaceae species NOT DETECTED NOT DETECTED Final   Enterobacter cloacae complex NOT DETECTED NOT DETECTED Final   Escherichia coli NOT DETECTED NOT DETECTED Final   Klebsiella oxytoca NOT DETECTED NOT DETECTED Final   Klebsiella pneumoniae NOT DETECTED NOT DETECTED Final   Proteus species NOT DETECTED NOT DETECTED Final   Serratia marcescens NOT DETECTED NOT DETECTED Final   Haemophilus influenzae NOT DETECTED NOT DETECTED Final  Neisseria meningitidis NOT DETECTED NOT DETECTED Final   Pseudomonas aeruginosa NOT DETECTED NOT DETECTED Final   Candida albicans NOT DETECTED NOT DETECTED Final   Candida glabrata NOT DETECTED NOT DETECTED Final   Candida krusei NOT DETECTED NOT DETECTED Final   Candida parapsilosis NOT DETECTED NOT DETECTED Final   Candida tropicalis NOT DETECTED NOT DETECTED Final  Culture, blood (Routine X 2) w Reflex to ID Panel     Status: Abnormal (Preliminary result)   Collection Time: 03/02/16  2:15 AM  Result Value Ref Range Status   Specimen Description BLOOD LEFT HAND  Final   Special Requests IN PEDIATRIC BOTTLE 2CC  Final   Culture  Setup Time   Final    GRAM POSITIVE COCCI IN CLUSTERS IN PEDIATRIC BOTTLE CRITICAL VALUE NOTED.  VALUE IS CONSISTENT WITH PREVIOUSLY REPORTED AND CALLED VALUE.    Culture (A)  Final    STAPHYLOCOCCUS AUREUS SUSCEPTIBILITIES PERFORMED ON PREVIOUS CULTURE WITHIN THE LAST 5 DAYS.    Report Status PENDING  Incomplete  MRSA PCR Screening     Status: Abnormal   Collection Time: 03/02/16  8:34 AM  Result Value Ref Range Status   MRSA by PCR POSITIVE (A) NEGATIVE Final    Comment:        The GeneXpert MRSA Assay (FDA approved for NASAL specimens only), is one component of a comprehensive MRSA colonization surveillance program. It is not intended to diagnose MRSA infection nor to guide or monitor treatment for MRSA infections. RESULT CALLED TO, READ BACK BY AND  VERIFIED WITH: R. Greenwalt RN 11:35 03/02/16 ([email or web address])   Aerobic Culture (superficial specimen)     Status: None (Preliminary result)   Collection Time: 03/02/16  2:10 PM  Result Value Ref Range Status   Specimen Description WOUND  Final   Special Requests AVF  Final   Gram Stain NO WBC SEEN NO ORGANISMS SEEN   Final   Culture   Final    MODERATE METHICILLIN RESISTANT STAPHYLOCOCCUS AUREUS   Report Status PENDING  Incomplete   Organism ID, Bacteria METHICILLIN RESISTANT STAPHYLOCOCCUS AUREUS  Final      Susceptibility   Methicillin resistant staphylococcus aureus - MIC*    CIPROFLOXACIN >=8 RESISTANT Resistant     ERYTHROMYCIN >=8 RESISTANT Resistant     GENTAMICIN <=0.5 SENSITIVE Sensitive     OXACILLIN >=4 RESISTANT Resistant     TETRACYCLINE <=1 SENSITIVE Sensitive     VANCOMYCIN <=0.5 SENSITIVE Sensitive     TRIMETH/SULFA <=10 SENSITIVE Sensitive     CLINDAMYCIN <=0.25 SENSITIVE Sensitive     RIFAMPIN <=0.5 SENSITIVE Sensitive     Inducible Clindamycin NEGATIVE Sensitive     * MODERATE METHICILLIN RESISTANT STAPHYLOCOCCUS AUREUS  Culture, blood (Routine X 2) w Reflex to ID Panel     Status: Abnormal (Preliminary result)   Collection Time: 03/03/16  4:54 AM  Result Value Ref Range Status   Specimen Description BLOOD RIGHT HAND  Final   Special Requests IN PEDIATRIC BOTTLE 1CC  Final   Culture  Setup Time   Final    GRAM POSITIVE COCCI IN CLUSTERS AEROBIC BOTTLE ONLY CRITICAL VALUE NOTED.  VALUE IS CONSISTENT WITH PREVIOUSLY REPORTED AND CALLED VALUE.    Culture (A)  Final    STAPHYLOCOCCUS AUREUS SUSCEPTIBILITIES PERFORMED ON PREVIOUS CULTURE WITHIN THE LAST 5 DAYS.    Report Status PENDING  Incomplete    Studies/Results: Dg Chest Port 1 View  Result Date: 03/03/2016 CLINICAL DATA:  60 y/o  F; central line  placement. EXAM: PORTABLE CHEST 1 VIEW COMPARISON:  None. FINDINGS: Cardiac silhouette within normal limits given projection and technique. Left central venous  catheter tip projects over the cavoatrial junction. Clear lungs. No pleural effusion. No pneumothorax. Bones are unremarkable. IMPRESSION: No active disease. Electronically Signed   By: Kristine Garbe M.D.   On: 03/03/2016 04:26   Dg Fluoro Guide Cv Line-no Report  Result Date: 03/03/2016 There is no Radiologist interpretation  for this exam.    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  Continue vanco  Total days of antibiotics: 3 vanco         [name] Infectious Diseases (pager) [phone] [email or web address] 03/04/2016, 4:07 PM  LOS: 2 days

## 2016-03-04 NOTE — Progress Notes (Signed)
Left Upper Extremity Vein Map    Cephalic  Segment Diameter Depth Comment  1. Axilla 2.3mm 15.28mm   2. Mid upper arm 3.45mm 10.34mm   3. Above [facility] 1.50mm 11.76mm Branch  4. In [facility] 1.17mm 4.29mm   5. Below AC 3.18mm 10.66mm   6. Mid forearm 2.30mm 4.61mm   7. Wrist 2.42mm 5.110mm Branch                  Basilic  Segment Diameter Depth Comment  1. Axilla     2. Mid upper arm 5.76mm 26.89mm Origin  3. Above AC 5.87mm 22.79mm   4. In [facility] 2.80mm 16.42mm   5. Below AC   Unable to visualize  6. Mid forearm   Unable to visualize  7. Wrist   Unable to visualize                  03/04/2016 2:49 PM Maudry Mayhew, BS, RVT, RDCS, RDMS

## 2016-03-04 NOTE — Anesthesia Postprocedure Evaluation (Signed)
Anesthesia Post Note  Patient: Rachael George  Procedure(s) Performed: Procedure(s): INSERTION OF DIALYSIS CATHETER LIGATION OF ARTERIOVENOUS  FISTULA  Patient location during evaluation: PACU Anesthesia Type: MAC Level of consciousness: awake and alert Pain management: pain level controlled Vital Signs Assessment: post-procedure vital signs reviewed and stable Respiratory status: spontaneous breathing, nonlabored ventilation, respiratory function stable and patient connected to nasal cannula oxygen Cardiovascular status: stable Anesthetic complications: no    Last Vitals:  Vitals:   03/04/16 0525 03/04/16 0931  BP: 117/69 125/75  Pulse: (!) 101 (!) 101  Resp: 14 16  Temp: 37.1 C 37 C    Last Pain:  Vitals:   03/04/16 0931  TempSrc: Oral  PainSc:                  Dalicia Kisner

## 2016-03-04 NOTE — Progress Notes (Signed)
PROGRESS NOTE        PATIENT DETAILS Name: Rachael George Age: 60 y.o. Sex: female Date of Birth: 08/18/1955 Admit Date: 03/01/2016 Admitting Physician Rise Patience, MD [id number], Jarold Song, MD  Brief Narrative: Patient is a 60 y.o. female with history of ESRD who presented with fever, pain and swelling of her right arm around her AV fistula site. Thought to have infection of AV fistula site along with Staph bacteremia. Hospital course complicated by bleeding right arm AV fistula leading to hemorrhagic shock and acute blood loss anemia-requiring emergent ligation of AV fistula on 12/12. See below for further details   Subjective: Afebrile overnight. Lying comfortably in bed this morning when I walked in.  No chest pain or SOB  Assessment/Plan: Hemorrhagic shock due to bleeding right arm AV fistula: Occurred 12/12 early am. BP now stable-transiently required pressor support. Received 2 units of PRBC transfusion, and emergently taken by vascular surgery for Ligartion of the AV fistula. Currently remained stable, vascular surgery following.  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 No further bleeding-VVS following for post op care  Acute blood loss anemia:due to bleeding right arm fistula-requiring PRBC transfusion-Monitor Hb-currently stable  AV fistula Infection with MRSA bacteremia: Continue empiric Vancomycin-ID following. TTE without any vegetation, have consulted cardiology for TEE. Repeat blood cultures on 12/12 still positive, have repeated blood cultures 12/13. ID following.  ESRD-on HD MWF: Nephrology consulted for dialysis care-unable to do HD yesterday-has new HD catheter placed 12/12 following ligation of AV fistula.   Hyperkalemia: resolved with Kayexalate, nephrology followiong for HD.  Thrombocytopenia: Mild-follow for now-likely worsened with  bacteremia/bleeding etc. Hold heparin for now.Follow CBC  Dyslipidemia: Continue statin  Gout: No evidence of flare, continue allopurinol  DVT Prophylaxis: Hold Prophylactic Heparin-place SCD's-if platelet count rebounds-will restart Heparin  Code Status: Full code   Family Communication: None at bedside  Disposition Plan: Remain inpatient  Antimicrobial agents: See below  Procedures: None  CONSULTS:  nephrology and vascular surgery  Time spent: 25 minutes-Greater than 50% of this time was spent in counseling, explanation of diagnosis, planning of further management, and coordination of care.  MEDICATIONS: Anti-infectives    Start     Dose/Rate Route Frequency Ordered Stop   03/05/16 1200  vancomycin (VANCOCIN) IVPB 750 mg/150 ml premix     750 mg 150 mL/hr over 60 Minutes Intravenous Every T-Th-Sa (Hemodialysis) 03/04/16 1146 03/07/16 1159   03/03/16 1415  vancomycin (VANCOCIN) IVPB 750 mg/150 ml premix     750 mg 150 mL/hr over 60 Minutes Intravenous Every T-Th-Sa (Hemodialysis) 03/03/16 1412 03/03/16 1612   03/03/16 1200  vancomycin (VANCOCIN) IVPB 750 mg/150 ml premix  Status:  Discontinued     750 mg 150 mL/hr over 60 Minutes Intravenous  Once 03/03/16 1121 03/03/16 1412   03/03/16 0600  vancomycin (VANCOCIN) IVPB 1000 mg/200 mL premix  Status:  Discontinued     1,000 mg 200 mL/hr over 60 Minutes Intravenous On call to O.R. 03/02/16 1554 03/02/16 1602   03/02/16 2200  ceFEPIme (MAXIPIME) 1 g in dextrose 5 % 50 mL IVPB  Status:  Discontinued     1 g 100 mL/hr over 30 Minutes Intravenous Every 24 hours 03/02/16 0415 03/02/16 1922   03/02/16 1200  vancomycin (VANCOCIN) IVPB 750 mg/150 ml premix  Status:  Discontinued     750 mg 150 mL/hr over 60 Minutes Intravenous Every M-W-F (Hemodialysis) 03/02/16 0415 03/04/16 1148   03/02/16 0430  vancomycin (VANCOCIN) IVPB 750 mg/150 ml premix     750 mg 150 mL/hr over 60 Minutes Intravenous  Once 03/02/16 0415 03/02/16  0621   03/02/16 0430  ceFEPIme (MAXIPIME) 1 g in dextrose 5 % 50 mL IVPB     1 g 100 mL/hr over 30 Minutes Intravenous  Once 03/02/16 0415 03/02/16 0549   03/02/16 0045  vancomycin (VANCOCIN) IVPB 1000 mg/200 mL premix     1,000 mg 200 mL/hr over 60 Minutes Intravenous  Once 03/02/16 0032 03/02/16 0319      Scheduled Meds: . allopurinol  100 mg Oral BID  . atorvastatin  80 mg Oral Daily  . calcitRIOL  2 mcg Oral Q M,W,F  . Chlorhexidine Gluconate Cloth  6 each Topical Q0600  . cinacalcet  30 mg Oral Q breakfast  . famotidine  20 mg Oral QHS  . multivitamin  1 tablet Oral QHS  . mupirocin ointment  1 application Nasal BID  . pantoprazole  40 mg Oral BID  . sevelamer carbonate  1,600 mg Oral TID WC  . [START ON 03/05/2016] vancomycin  750 mg Intravenous Q T,Th,Sa-HD   Continuous Infusions:  PRN Meds:.acetaminophen **OR** acetaminophen, albuterol, morphine injection, ondansetron **OR** ondansetron (ZOFRAN) IV, oxyCODONE, oxyCODONE-acetaminophen   PHYSICAL EXAM: Vital signs: Vitals:   03/03/16 1921 03/03/16 2131 03/04/16 0525 03/04/16 0931  BP: (!) 162/92 (!) 162/77 117/69 125/75  Pulse: (!) 103 95 (!) 101 (!) 101  Resp: 18 16 14 16   Temp: 98.9 F (37.2 C) 98.3 F (36.8 C) 98.8 F (37.1 C) 98.6 F (37 C)  TempSrc: Oral Oral Oral Oral  SpO2: 100% 99% 99% 99%  Weight:  82.6 kg (182 lb 3.2 oz)    Height:       Filed Weights   03/03/16 1255 03/03/16 1615 03/03/16 2131  Weight: 84.4 kg (186 lb 1.1 oz) 82.8 kg (182 lb 8.7 oz) 82.6 kg (182 lb 3.2 oz)   Body mass index is 33.32 kg/m.   General appearance :Awake, alert, not in any distress. Speech Clear. Not toxic Looking Eyes:, pupils equally reactive to light and accomodation,no scleral icterus.Pink conjunctiva HEENT: Atraumatic and Normocephalic Neck: supple, no JVD. No cervical lymphadenopathy. No thyromegaly Resp:Good air entry bilaterally, no added sounds  CVS: S1 S2 regular, no murmurs.  GI: Bowel sounds present,  Non tender and not distended with no gaurding, rigidity or rebound.No organomegaly Extremities: B/L Lower Ext shows no edema, both legs are warm to touch.Right arm bandaged-did not open.   Neurology:  speech clear,Non focal, sensation is grossly intact. Psychiatric: Normal judgment and insight. Alert and oriented x 3. Normal mood. Musculoskeletal:No digital cyanosis Skin:No Rash, warm and dry Wounds:N/A  I have personally reviewed following labs and imaging studies  LABORATORY DATA: CBC:  Recent Labs Lab 03/02/16 0105 03/02/16 0836 03/03/16 0053 03/03/16 0454 03/04/16 0915  WBC 8.3 7.3  --  9.6 6.1  NEUTROABS 7.0  --   --   --   --   HGB 11.6* 11.7* 8.3* 8.6* 9.1*  HCT 35.4* 36.2 25.3* 25.8* 26.8*  MCV 80.6 80.8  --  81.6 79.5  PLT 121* 104*  --  73* 75*    Basic Metabolic Panel:  Recent Labs Lab 03/02/16 0105 03/02/16 0836 03/03/16 0454 03/04/16 0857  NA 136 134* 135 136  K 5.5* 6.0*  4.7 3.9  CL 100* 99* 101 98*  CO2 21* 20* 21* 28  GLUCOSE 100* 103* 133* 97  BUN 68* 70* 72* 37*  CREATININE 10.03* 10.54* 11.00* 7.75*  CALCIUM 9.1 8.9 7.8* 8.5*  PHOS  --   --   --  5.2*    GFR: Estimated Creatinine Clearance: 7.8 mL/min (by C-G formula based on SCr of 7.75 mg/dL (H)).  Liver Function Tests:  Recent Labs Lab 03/04/16 0857  ALBUMIN 2.8*   No results for input(s): LIPASE, AMYLASE in the last 168 hours. No results for input(s): AMMONIA in the last 168 hours.  Coagulation Profile:  Recent Labs Lab 03/03/16 0053  INR 1.19    Cardiac Enzymes: No results for input(s): CKTOTAL, CKMB, CKMBINDEX, TROPONINI in the last 168 hours.  BNP (last 3 results) No results for input(s): PROBNP in the last 8760 hours.  HbA1C: No results for input(s): HGBA1C in the last 72 hours.  CBG: No results for input(s): GLUCAP in the last 168 hours.  Lipid Profile: No results for input(s): CHOL, HDL, LDLCALC, TRIG, CHOLHDL, LDLDIRECT in the last 72 hours.  Thyroid  Function Tests: No results for input(s): TSH, T4TOTAL, FREET4, T3FREE, THYROIDAB in the last 72 hours.  Anemia Panel: No results for input(s): VITAMINB12, FOLATE, FERRITIN, TIBC, IRON, RETICCTPCT in the last 72 hours.  Urine analysis:    Component Value Date/Time   BILIRUBINUR neg 12/25/2013 1746   PROTEINUR >=300 12/25/2013 1746   UROBILINOGEN 0.2 12/25/2013 1746   NITRITE neg 12/25/2013 1746   LEUKOCYTESUR Negative 12/25/2013 1746    Sepsis Labs: Lactic Acid, Venous    Component Value Date/Time   LATICACIDVEN 1.77 03/02/2016 0116    MICROBIOLOGY: Recent Results (from the past 240 hour(s))  Culture, blood (Routine X 2) w Reflex to ID Panel     Status: Abnormal   Collection Time: 03/02/16  1:10 AM  Result Value Ref Range Status   Specimen Description BLOOD LEFT ARM  Final   Special Requests IN PEDIATRIC BOTTLE 4CC  Final   Culture  Setup Time   Final    GRAM POSITIVE COCCI IN CLUSTERS IN PEDIATRIC BOTTLE CRITICAL RESULT CALLED TO, READ BACK BY AND VERIFIED WITH: LPablo Lawrence.D. 16:10 03/02/16 ([email or web address])    Culture METHICILLIN RESISTANT STAPHYLOCOCCUS AUREUS (A)  Final   Report Status 03/04/2016 FINAL  Final   Organism ID, Bacteria METHICILLIN RESISTANT STAPHYLOCOCCUS AUREUS  Final      Susceptibility   Methicillin resistant staphylococcus aureus - MIC*    CIPROFLOXACIN >=8 RESISTANT Resistant     ERYTHROMYCIN >=8 RESISTANT Resistant     GENTAMICIN <=0.5 SENSITIVE Sensitive     OXACILLIN >=4 RESISTANT Resistant     TETRACYCLINE <=1 SENSITIVE Sensitive     VANCOMYCIN <=0.5 SENSITIVE Sensitive     TRIMETH/SULFA <=10 SENSITIVE Sensitive     CLINDAMYCIN <=0.25 SENSITIVE Sensitive     RIFAMPIN <=0.5 SENSITIVE Sensitive     Inducible Clindamycin NEGATIVE Sensitive     * METHICILLIN RESISTANT STAPHYLOCOCCUS AUREUS  Blood Culture ID Panel (Reflexed)     Status: Abnormal   Collection Time: 03/02/16  1:10 AM  Result Value Ref Range Status   Enterococcus species NOT  DETECTED NOT DETECTED Final   Listeria monocytogenes NOT DETECTED NOT DETECTED Final   Staphylococcus species DETECTED (A) NOT DETECTED Final    Comment: CRITICAL RESULT CALLED TO, READ BACK BY AND VERIFIED WITH: LPablo Lawrence.D. 16:10 03/02/16 ([email or web address])    Staphylococcus aureus DETECTED (A) NOT DETECTED  Final    Comment: CRITICAL RESULT CALLED TO, READ BACK BY AND VERIFIED WITH: LPablo Lawrence.D. 16:10 03/02/16 (wilsonm)    Methicillin resistance DETECTED (A) NOT DETECTED Final    Comment: CRITICAL RESULT CALLED TO, READ BACK BY AND VERIFIED WITH: LPablo Lawrence.D. 16:10 03/02/16 (wilsonm)    Streptococcus species NOT DETECTED NOT DETECTED Final   Streptococcus agalactiae NOT DETECTED NOT DETECTED Final   Streptococcus pneumoniae NOT DETECTED NOT DETECTED Final   Streptococcus pyogenes NOT DETECTED NOT DETECTED Final   Acinetobacter baumannii NOT DETECTED NOT DETECTED Final   Enterobacteriaceae species NOT DETECTED NOT DETECTED Final   Enterobacter cloacae complex NOT DETECTED NOT DETECTED Final   Escherichia coli NOT DETECTED NOT DETECTED Final   Klebsiella oxytoca NOT DETECTED NOT DETECTED Final   Klebsiella pneumoniae NOT DETECTED NOT DETECTED Final   Proteus species NOT DETECTED NOT DETECTED Final   Serratia marcescens NOT DETECTED NOT DETECTED Final   Haemophilus influenzae NOT DETECTED NOT DETECTED Final   Neisseria meningitidis NOT DETECTED NOT DETECTED Final   Pseudomonas aeruginosa NOT DETECTED NOT DETECTED Final   Candida albicans NOT DETECTED NOT DETECTED Final   Candida glabrata NOT DETECTED NOT DETECTED Final   Candida krusei NOT DETECTED NOT DETECTED Final   Candida parapsilosis NOT DETECTED NOT DETECTED Final   Candida tropicalis NOT DETECTED NOT DETECTED Final  Culture, blood (Routine X 2) w Reflex to ID Panel     Status: Abnormal (Preliminary result)   Collection Time: 03/02/16  2:15 AM  Result Value Ref Range Status   Specimen Description BLOOD LEFT HAND   Final   Special Requests IN PEDIATRIC BOTTLE 2CC  Final   Culture  Setup Time   Final    GRAM POSITIVE COCCI IN CLUSTERS IN PEDIATRIC BOTTLE CRITICAL VALUE NOTED.  VALUE IS CONSISTENT WITH PREVIOUSLY REPORTED AND CALLED VALUE.    Culture (A)  Final    STAPHYLOCOCCUS AUREUS SUSCEPTIBILITIES PERFORMED ON PREVIOUS CULTURE WITHIN THE LAST 5 DAYS.    Report Status PENDING  Incomplete  MRSA PCR Screening     Status: Abnormal   Collection Time: 03/02/16  8:34 AM  Result Value Ref Range Status   MRSA by PCR POSITIVE (A) NEGATIVE Final    Comment:        The GeneXpert MRSA Assay (FDA approved for NASAL specimens only), is one component of a comprehensive MRSA colonization surveillance program. It is not intended to diagnose MRSA infection nor to guide or monitor treatment for MRSA infections. RESULT CALLED TO, READ BACK BY AND VERIFIED WITH: R. Greenwalt RN 11:35 03/02/16 ([email or web address])   Aerobic Culture (superficial specimen)     Status: None (Preliminary result)   Collection Time: 03/02/16  2:10 PM  Result Value Ref Range Status   Specimen Description WOUND  Final   Special Requests AVF  Final   Gram Stain NO WBC SEEN NO ORGANISMS SEEN   Final   Culture   Final    MODERATE METHICILLIN RESISTANT STAPHYLOCOCCUS AUREUS   Report Status PENDING  Incomplete   Organism ID, Bacteria METHICILLIN RESISTANT STAPHYLOCOCCUS AUREUS  Final      Susceptibility   Methicillin resistant staphylococcus aureus - MIC*    CIPROFLOXACIN >=8 RESISTANT Resistant     ERYTHROMYCIN >=8 RESISTANT Resistant     GENTAMICIN <=0.5 SENSITIVE Sensitive     OXACILLIN >=4 RESISTANT Resistant     TETRACYCLINE <=1 SENSITIVE Sensitive     VANCOMYCIN <=0.5 SENSITIVE Sensitive     TRIMETH/SULFA <=10 SENSITIVE  Sensitive     CLINDAMYCIN <=0.25 SENSITIVE Sensitive     RIFAMPIN <=0.5 SENSITIVE Sensitive     Inducible Clindamycin NEGATIVE Sensitive     * MODERATE METHICILLIN RESISTANT STAPHYLOCOCCUS AUREUS  Culture,  blood (Routine X 2) w Reflex to ID Panel     Status: Abnormal (Preliminary result)   Collection Time: 03/03/16  4:54 AM  Result Value Ref Range Status   Specimen Description BLOOD RIGHT HAND  Final   Special Requests IN PEDIATRIC BOTTLE 1CC  Final   Culture  Setup Time   Final    GRAM POSITIVE COCCI IN CLUSTERS AEROBIC BOTTLE ONLY CRITICAL VALUE NOTED.  VALUE IS CONSISTENT WITH PREVIOUSLY REPORTED AND CALLED VALUE.    Culture (A)  Final    STAPHYLOCOCCUS AUREUS SUSCEPTIBILITIES PERFORMED ON PREVIOUS CULTURE WITHIN THE LAST 5 DAYS.    Report Status PENDING  Incomplete    RADIOLOGY STUDIES/RESULTS: Dg Chest Port 1 View  Result Date: 03/03/2016 CLINICAL DATA:  60 y/o  F; central line placement. EXAM: PORTABLE CHEST 1 VIEW COMPARISON:  None. FINDINGS: Cardiac silhouette within normal limits given projection and technique. Left central venous catheter tip projects over the cavoatrial junction. Clear lungs. No pleural effusion. No pneumothorax. Bones are unremarkable. IMPRESSION: No active disease. Electronically Signed   By: Kristine Garbe M.D.   On: 03/03/2016 04:26   Dg Fluoro Guide Cv Line-no Report  Result Date: 03/03/2016 There is no Radiologist interpretation  for this exam.    LOS: 2 days   Oren Binet, MD  [facility] Pager:[phone]  If 7PM-7AM, please contact night-coverage [email or web address] Password [email or web address] 03/04/2016, 3:02 PM

## 2016-03-04 NOTE — Progress Notes (Signed)
[facility] Progress Note   Subjective: stable in bed, no c/o's  Vitals:   03/03/16 1921 03/03/16 2131 03/04/16 0525 03/04/16 0931  BP: (!) 162/92 (!) 162/77 117/69 125/75  Pulse: (!) 103 95 (!) 101 (!) 101  Resp: 18 16 14 16   Temp: 98.9 F (37.2 C) 98.3 F (36.8 C) 98.8 F (37.1 C) 98.6 F (37 C)  TempSrc: Oral Oral Oral Oral  SpO2: 100% 99% 99% 99%  Weight:  82.6 kg (182 lb 3.2 oz)    Height:        Inpatient medications: . allopurinol  100 mg Oral BID  . atorvastatin  80 mg Oral Daily  . calcitRIOL  2 mcg Oral Q M,W,F-HD  . Chlorhexidine Gluconate Cloth  6 each Topical Q0600  . cinacalcet  30 mg Oral Q breakfast  . famotidine  20 mg Oral QHS  . multivitamin  1 tablet Oral QHS  . mupirocin ointment  1 application Nasal BID  . pantoprazole  40 mg Oral BID  . sevelamer carbonate  1,600 mg Oral TID WC  . vancomycin  750 mg Intravenous Q M,W,F-HD   . norepinephrine (LEVOPHED) Adult infusion Stopped (03/03/16 0137)   acetaminophen **OR** acetaminophen, albuterol, morphine injection, ondansetron **OR** ondansetron (ZOFRAN) IV, oxyCODONE, oxyCODONE-acetaminophen  Exam: General: Well developed, well nourished Neck: Supple. JVD not elevated. Lungs: Clear bilaterally to auscultation without wheezes, rales, or rhonchi Heart: RRR with S1 S2. No murmurs, rubs, or gallops appreciated. Abdomen: Soft, non-tender, non-distended with normoactive bowel sounds Lower extremities:without edema or ischemic changes Dialysis Access: RUA AVF wrapped, L IJ TDC  Dialysis Orders: [facility] MWF  4h  81kg  2/2 bath  Hep 7000   L IJ TDC (new this admit)/ R AVF (ligated) Calcitriol 2.0 mcg PO q treatment (last PTH 407 01/29/16)  BMD medications:  Sevelamer 800 mg 4 tablets PO TID w/meals Sensipar 30 mg PO daily   Recent labs: HGB 11.9 (02/26/16) Fe 55 Tsat 25% 01/29/16   Assessment: 1.  Infected RUA AVF: Vanc/cefepime, s/p ligation AVF and resection of necrotic tissue. BLood  cx's + for GPC. Doing better.  ID recommending TEE.   2. ESRD - off sched, had HD yest 3.  Hypertension/volume  - No antihypertensive meds on OP med list. Close to dry wt 4.  Anemia  - HGB 11.7. No OP ESA. None needed now. Follow HGB.  5.  Metabolic bone disease -  Cont binders/VDRA 6.  Nutrition - Renal diet w/fld restrictions/nepro/renal vits.    Plan - HD tomorrow off schedule.     Kelly Splinter MD [facility] pager [phone]   03/04/2016, 9:34 AM    Recent Labs Lab 03/02/16 0105 03/02/16 0836 03/03/16 0454  NA 136 134* 135  K 5.5* 6.0* 4.7  CL 100* 99* 101  CO2 21* 20* 21*  GLUCOSE 100* 103* 133*  BUN 68* 70* 72*  CREATININE 10.03* 10.54* 11.00*  CALCIUM 9.1 8.9 7.8*   No results for input(s): AST, ALT, ALKPHOS, BILITOT, PROT, ALBUMIN in the last 168 hours.  Recent Labs Lab 03/02/16 0105 03/02/16 0836 03/03/16 0053 03/03/16 0454  WBC 8.3 7.3  --  9.6  NEUTROABS 7.0  --   --   --   HGB 11.6* 11.7* 8.3* 8.6*  HCT 35.4* 36.2 25.3* 25.8*  MCV 80.6 80.8  --  81.6  PLT 121* 104*  --  73*   Iron/TIBC/Ferritin/ %Sat    Component Value Date/Time   IRON 46 10/04/2014 1016  TIBC 205 (L) 10/04/2014 1016   FERRITIN 1,168 (H) 10/04/2014 1016   IRONPCTSAT 22 10/04/2014 1016

## 2016-03-05 DIAGNOSIS — Z992 Dependence on renal dialysis: Secondary | ICD-10-CM

## 2016-03-05 DIAGNOSIS — D631 Anemia in chronic kidney disease: Secondary | ICD-10-CM

## 2016-03-05 LAB — RENAL FUNCTION PANEL
Albumin: 2.7 g/dL — ABNORMAL LOW (ref 3.5–5.0)
Anion gap: 12 (ref 5–15)
BUN: 53 mg/dL — ABNORMAL HIGH (ref 6–20)
CO2: 26 mmol/L (ref 22–32)
Calcium: 8.3 mg/dL — ABNORMAL LOW (ref 8.9–10.3)
Chloride: 99 mmol/L — ABNORMAL LOW (ref 101–111)
Creatinine, Ser: 9.34 mg/dL — ABNORMAL HIGH (ref 0.44–1.00)
GFR calc Af Amer: 5 mL/min — ABNORMAL LOW (ref >60)
GFR calc non Af Amer: 4 mL/min — ABNORMAL LOW (ref >60)
Glucose, Bld: 104 mg/dL — ABNORMAL HIGH (ref 65–99)
Phosphorus: 5.3 mg/dL — ABNORMAL HIGH (ref 2.5–4.6)
Potassium: 4 mmol/L (ref 3.5–5.1)
Sodium: 137 mmol/L (ref 135–145)

## 2016-03-05 LAB — AEROBIC CULTURE W GRAM STAIN (SUPERFICIAL SPECIMEN): Gram Stain: NONE SEEN

## 2016-03-05 LAB — GLUCOSE, CAPILLARY: Glucose-Capillary: 110 mg/dL — ABNORMAL HIGH (ref 65–99)

## 2016-03-05 LAB — CULTURE, BLOOD (ROUTINE X 2)

## 2016-03-05 LAB — CBC
HCT: 25.2 % — ABNORMAL LOW (ref 36.0–46.0)
Hemoglobin: 8.5 g/dL — ABNORMAL LOW (ref 12.0–15.0)
MCH: 27 pg (ref 26.0–34.0)
MCHC: 33.7 g/dL (ref 30.0–36.0)
MCV: 80 fL (ref 78.0–100.0)
Platelets: 90 10*3/uL — ABNORMAL LOW (ref 150–400)
RBC: 3.15 MIL/uL — ABNORMAL LOW (ref 3.87–5.11)
RDW: 15.8 % — ABNORMAL HIGH (ref 11.5–15.5)
WBC: 4.8 10*3/uL (ref 4.0–10.5)

## 2016-03-05 MED ORDER — LIDOCAINE-PRILOCAINE 2.5-2.5 % EX CREA: 1.0000 "application " | TOPICAL_CREAM | CUTANEOUS | Status: DC | PRN

## 2016-03-05 MED ORDER — PENTAFLUOROPROP-TETRAFLUOROETH EX AERO: 1.0000 "application " | INHALATION_SPRAY | CUTANEOUS | Status: DC | PRN

## 2016-03-05 MED ORDER — ALTEPLASE 2 MG IJ SOLR: 2.0000 mg | Freq: Once | INTRAMUSCULAR | Status: DC | PRN

## 2016-03-05 MED ORDER — HEPARIN SODIUM (PORCINE) 1000 UNIT/ML DIALYSIS: 1000.0000 [IU] | INTRAMUSCULAR | Status: DC | PRN

## 2016-03-05 MED ORDER — HEPARIN SODIUM (PORCINE) 1000 UNIT/ML DIALYSIS
7000.0000 [IU] | Freq: Once | INTRAMUSCULAR | Status: DC
  Filled 2016-03-05: qty 7

## 2016-03-05 MED ORDER — HEPARIN SODIUM (PORCINE) 5000 UNIT/ML IJ SOLN
5000.0000 [IU] | Freq: Three times a day (TID) | INTRAMUSCULAR | Status: DC
  Administered 2016-03-05 – 2016-03-06 (×2): 5000 [IU] via SUBCUTANEOUS
  Filled 2016-03-05 (×2): qty 1

## 2016-03-05 MED ORDER — LIDOCAINE HCL (PF) 1 % IJ SOLN: 5.0000 mL | INTRAMUSCULAR | Status: DC | PRN

## 2016-03-05 MED ORDER — SODIUM CHLORIDE 0.9 % IV SOLN: 100.0000 mL | INTRAVENOUS | Status: DC | PRN

## 2016-03-05 MED ORDER — SODIUM CHLORIDE 0.9 % IV SOLN
INTRAVENOUS | Status: DC
  Administered 2016-03-05: 23:00:00 via INTRAVENOUS
  Administered 2016-03-06: 500 mL via INTRAVENOUS

## 2016-03-05 MED ORDER — VANCOMYCIN HCL IN DEXTROSE 750-5 MG/150ML-% IV SOLN
INTRAVENOUS | Status: AC
  Filled 2016-03-05: qty 150

## 2016-03-05 MED ORDER — HEPARIN SODIUM (PORCINE) 1000 UNIT/ML DIALYSIS: 5000.0000 [IU] | Freq: Once | INTRAMUSCULAR | Status: DC

## 2016-03-05 NOTE — Progress Notes (Signed)
PROGRESS NOTE        PATIENT DETAILS Name: Rachael George Age: 60 y.o. Sex: female Date of Birth: [birth year] Admit Date: 03/01/2016 Admitting Physician Rise Patience, MD GMW:[id number], Jarold Song, MD  Brief Narrative: Patient is a 60 y.o. female with history of ESRD who presented with fever, pain and swelling of her right arm around her AV fistula site. Thought to have infection of AV fistula site along with Staph bacteremia. Hospital course complicated by bleeding right arm AV fistula leading to hemorrhagic shock and acute blood loss anemia-requiring emergent ligation of AV fistula on 12/12. See below for further details   Subjective: Afebrile overnight. Lying comfortably in bed this morning when I walked in.  No chest pain or SOB  Assessment/Plan: Hemorrhagic shock due to bleeding right arm AV fistula: Occurred 12/12 early am. BP now stable-transiently required pressor support. Received 2 units of PRBC transfusion, and emergently taken by vascular surgery for Ligartion of the AV fistula. Currently remains stable, vascular surgery following.  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 No further bleeding-VVS following for post op care  Acute blood loss anemia:due to bleeding right arm fistula-requiring PRBC transfusion-Monitor Hb-currently stable  AV fistula Infection with MRSA bacteremia: Continue empiric Vancomycin-ID following. TTE without any vegetation,  TEE scheduled for 12/15. Repeat blood cultures on 12/12 still positive, await blood cultures drawn on 12/13.  ID following.  ESRD-on HD MWF: Nephrology consulted for dialysis care-unable to do HD yesterday-has new HD catheter placed 12/12 following ligation of AV fistula.   Hyperkalemia: resolved with Kayexalate, nephrology followiong for HD.  Thrombocytopenia: Mild-follow for now-likely worsened with  bacteremia/bleeding etc. prophylactic heparin held-suspect we could resume at this time.    Dyslipidemia: Continue statin  Gout: No evidence of flare, continue allopurinol  DVT Prophylaxis: Restart Prophylactic Heparin  Code Status: Full code   Family Communication: None at bedside  Disposition Plan: Remain inpatient  Antimicrobial agents: See below  Procedures: None  CONSULTS:  nephrology and vascular surgery  Time spent: 25 minutes-Greater than 50% of this time was spent in counseling, explanation of diagnosis, planning of further management, and coordination of care.  MEDICATIONS: Anti-infectives    Start     Dose/Rate Route Frequency Ordered Stop   03/05/16 1200  vancomycin (VANCOCIN) IVPB 750 mg/150 ml premix     750 mg 150 mL/hr over 60 Minutes Intravenous Every T-Th-Sa (Hemodialysis) 03/04/16 1146 03/07/16 1159   03/03/16 1415  vancomycin (VANCOCIN) IVPB 750 mg/150 ml premix     750 mg 150 mL/hr over 60 Minutes Intravenous Every T-Th-Sa (Hemodialysis) 03/03/16 1412 03/03/16 1612   03/03/16 1200  vancomycin (VANCOCIN) IVPB 750 mg/150 ml premix  Status:  Discontinued     750 mg 150 mL/hr over 60 Minutes Intravenous  Once 03/03/16 1121 03/03/16 1412   03/03/16 0600  vancomycin (VANCOCIN) IVPB 1000 mg/200 mL premix  Status:  Discontinued     1,000 mg 200 mL/hr over 60 Minutes Intravenous On call to O.R. 03/02/16 1554 03/02/16 1602   03/02/16 2200  ceFEPIme (MAXIPIME) 1 g in dextrose 5 % 50 mL IVPB  Status:  Discontinued     1 g 100 mL/hr over 30 Minutes Intravenous Every 24 hours 03/02/16 0415 03/02/16 1922   03/02/16 1200  vancomycin (VANCOCIN) IVPB 750 mg/150  ml premix  Status:  Discontinued     750 mg 150 mL/hr over 60 Minutes Intravenous Every M-W-F (Hemodialysis) 03/02/16 0415 03/04/16 1148   03/02/16 0430  vancomycin (VANCOCIN) IVPB 750 mg/150 ml premix     750 mg 150 mL/hr over 60 Minutes Intravenous  Once 03/02/16 0415 03/02/16 0621   03/02/16 0430   ceFEPIme (MAXIPIME) 1 g in dextrose 5 % 50 mL IVPB     1 g 100 mL/hr over 30 Minutes Intravenous  Once 03/02/16 0415 03/02/16 0549   03/02/16 0045  vancomycin (VANCOCIN) IVPB 1000 mg/200 mL premix     1,000 mg 200 mL/hr over 60 Minutes Intravenous  Once 03/02/16 0032 03/02/16 0319      Scheduled Meds: . allopurinol  100 mg Oral BID  . atorvastatin  80 mg Oral Daily  . calcitRIOL  2 mcg Oral Q M,W,F  . Chlorhexidine Gluconate Cloth  6 each Topical Q0600  . cinacalcet  30 mg Oral Q breakfast  . famotidine  20 mg Oral QHS  . [START ON 03/06/2016] heparin  5,000 Units Dialysis Once in dialysis  . heparin  7,000 Units Dialysis Once in dialysis  . multivitamin  1 tablet Oral QHS  . mupirocin ointment  1 application Nasal BID  . pantoprazole  40 mg Oral BID  . sevelamer carbonate  1,600 mg Oral TID WC  . vancomycin  750 mg Intravenous Q T,Th,Sa-HD   Continuous Infusions:  PRN Meds:.sodium chloride, sodium chloride, acetaminophen **OR** acetaminophen, albuterol, alteplase, heparin, lidocaine (PF), lidocaine-prilocaine, morphine injection, ondansetron **OR** ondansetron (ZOFRAN) IV, oxyCODONE, oxyCODONE-acetaminophen, pentafluoroprop-tetrafluoroeth   PHYSICAL EXAM: Vital signs: Vitals:   03/05/16 0534 03/05/16 0945 03/05/16 1200 03/05/16 1205  BP: 137/81 140/76 (!) 154/92 (!) 154/91  Pulse: 93 94 86 80  Resp: 17 18 13 13   Temp: 98.2 F (36.8 C) 98.5 F (36.9 C)    TempSrc: Oral Oral    SpO2: 100% 100%    Weight:      Height:       Filed Weights   03/03/16 1615 03/03/16 2131 03/04/16 2107  Weight: 82.8 kg (182 lb 8.7 oz) 82.6 kg (182 lb 3.2 oz) 83.5 kg (184 lb 1.4 oz)   Body mass index is 33.67 kg/m.   General appearance :Awake, alert, not in any distress. Speech Clear. Not toxic Looking Eyes:, pupils equally reactive to light and accomodation,no scleral icterus.Pink conjunctiva HEENT: Atraumatic and Normocephalic Neck: supple, no JVD. No cervical lymphadenopathy. No  thyromegaly Resp:Good air entry bilaterally, no added sounds  CVS: S1 S2 regular, no murmurs.  GI: Bowel sounds present, Non tender and not distended with no gaurding, rigidity or rebound.No organomegaly Extremities: B/L Lower Ext shows no edema, both legs are warm to touch.Right arm bandaged-did not open.   Neurology:  speech clear,Non focal, sensation is grossly intact. Psychiatric: Normal judgment and insight. Alert and oriented x 3. Normal mood. Musculoskeletal:No digital cyanosis Skin:No Rash, warm and dry Wounds:N/A  I have personally reviewed following labs and imaging studies  LABORATORY DATA: CBC:  Recent Labs Lab 03/02/16 0105 03/02/16 0836 03/03/16 0053 03/03/16 0454 03/04/16 0915 03/05/16 1104  WBC 8.3 7.3  --  9.6 6.1 4.8  NEUTROABS 7.0  --   --   --   --   --   HGB 11.6* 11.7* 8.3* 8.6* 9.1* 8.5*  HCT 35.4* 36.2 25.3* 25.8* 26.8* 25.2*  MCV 80.6 80.8  --  81.6 79.5 80.0  PLT 121* 104*  --  73* 75*  90*    Basic Metabolic Panel:  Recent Labs Lab 03/02/16 0105 03/02/16 0836 03/03/16 0454 03/04/16 0857 03/05/16 1104  NA 136 134* 135 136 137  K 5.5* 6.0* 4.7 3.9 4.0  CL 100* 99* 101 98* 99*  CO2 21* 20* 21* 28 26  GLUCOSE 100* 103* 133* 97 104*  BUN 68* 70* 72* 37* 53*  CREATININE 10.03* 10.54* 11.00* 7.75* 9.34*  CALCIUM 9.1 8.9 7.8* 8.5* 8.3*  PHOS  --   --   --  5.2* 5.3*    GFR: Estimated Creatinine Clearance: 6.5 mL/min (by C-G formula based on SCr of 9.34 mg/dL (H)).  Liver Function Tests:  Recent Labs Lab 03/04/16 0857 03/05/16 1104  ALBUMIN 2.8* 2.7*   No results for input(s): LIPASE, AMYLASE in the last 168 hours. No results for input(s): AMMONIA in the last 168 hours.  Coagulation Profile:  Recent Labs Lab 03/03/16 0053  INR 1.19    Cardiac Enzymes: No results for input(s): CKTOTAL, CKMB, CKMBINDEX, TROPONINI in the last 168 hours.  BNP (last 3 results) No results for input(s): PROBNP in the last 8760  hours.  HbA1C: No results for input(s): HGBA1C in the last 72 hours.  CBG: No results for input(s): GLUCAP in the last 168 hours.  Lipid Profile: No results for input(s): CHOL, HDL, LDLCALC, TRIG, CHOLHDL, LDLDIRECT in the last 72 hours.  Thyroid Function Tests: No results for input(s): TSH, T4TOTAL, FREET4, T3FREE, THYROIDAB in the last 72 hours.  Anemia Panel: No results for input(s): VITAMINB12, FOLATE, FERRITIN, TIBC, IRON, RETICCTPCT in the last 72 hours.  Urine analysis:    Component Value Date/Time   BILIRUBINUR neg 12/25/2013 1746   PROTEINUR >=300 12/25/2013 1746   UROBILINOGEN 0.2 12/25/2013 1746   NITRITE neg 12/25/2013 1746   LEUKOCYTESUR Negative 12/25/2013 1746    Sepsis Labs: Lactic Acid, Venous    Component Value Date/Time   LATICACIDVEN 1.77 03/02/2016 0116    MICROBIOLOGY: Recent Results (from the past 240 hour(s))  Culture, blood (Routine X 2) w Reflex to ID Panel     Status: Abnormal   Collection Time: 03/02/16  1:10 AM  Result Value Ref Range Status   Specimen Description BLOOD LEFT ARM  Final   Special Requests IN PEDIATRIC BOTTLE 4CC  Final   Culture  Setup Time   Final    GRAM POSITIVE COCCI IN CLUSTERS IN PEDIATRIC BOTTLE CRITICAL RESULT CALLED TO, READ BACK BY AND VERIFIED WITH: LPablo Lawrence.D. 16:10 03/02/16 ([email or web address])    Culture METHICILLIN RESISTANT STAPHYLOCOCCUS AUREUS (A)  Final   Report Status 03/04/2016 FINAL  Final   Organism ID, Bacteria METHICILLIN RESISTANT STAPHYLOCOCCUS AUREUS  Final      Susceptibility   Methicillin resistant staphylococcus aureus - MIC*    CIPROFLOXACIN >=8 RESISTANT Resistant     ERYTHROMYCIN >=8 RESISTANT Resistant     GENTAMICIN <=0.5 SENSITIVE Sensitive     OXACILLIN >=4 RESISTANT Resistant     TETRACYCLINE <=1 SENSITIVE Sensitive     VANCOMYCIN <=0.5 SENSITIVE Sensitive     TRIMETH/SULFA <=10 SENSITIVE Sensitive     CLINDAMYCIN <=0.25 SENSITIVE Sensitive     RIFAMPIN <=0.5 SENSITIVE Sensitive      Inducible Clindamycin NEGATIVE Sensitive     * METHICILLIN RESISTANT STAPHYLOCOCCUS AUREUS  Blood Culture ID Panel (Reflexed)     Status: Abnormal   Collection Time: 03/02/16  1:10 AM  Result Value Ref Range Status   Enterococcus species NOT DETECTED NOT DETECTED Final   Listeria monocytogenes  NOT DETECTED NOT DETECTED Final   Staphylococcus species DETECTED (A) NOT DETECTED Final    Comment: CRITICAL RESULT CALLED TO, READ BACK BY AND VERIFIED WITH: LPablo Lawrence.D. 16:10 03/02/16 ([email or web address])    Staphylococcus aureus DETECTED (A) NOT DETECTED Final    Comment: CRITICAL RESULT CALLED TO, READ BACK BY AND VERIFIED WITH: LPablo Lawrence.D. 16:10 03/02/16 ([email or web address])    Methicillin resistance DETECTED (A) NOT DETECTED Final    Comment: CRITICAL RESULT CALLED TO, READ BACK BY AND VERIFIED WITH: LPablo Lawrence.D. 16:10 03/02/16 (wilsonm)    Streptococcus species NOT DETECTED NOT DETECTED Final   Streptococcus agalactiae NOT DETECTED NOT DETECTED Final   Streptococcus pneumoniae NOT DETECTED NOT DETECTED Final   Streptococcus pyogenes NOT DETECTED NOT DETECTED Final   Acinetobacter baumannii NOT DETECTED NOT DETECTED Final   Enterobacteriaceae species NOT DETECTED NOT DETECTED Final   Enterobacter cloacae complex NOT DETECTED NOT DETECTED Final   Escherichia coli NOT DETECTED NOT DETECTED Final   Klebsiella oxytoca NOT DETECTED NOT DETECTED Final   Klebsiella pneumoniae NOT DETECTED NOT DETECTED Final   Proteus species NOT DETECTED NOT DETECTED Final   Serratia marcescens NOT DETECTED NOT DETECTED Final   Haemophilus influenzae NOT DETECTED NOT DETECTED Final   Neisseria meningitidis NOT DETECTED NOT DETECTED Final   Pseudomonas aeruginosa NOT DETECTED NOT DETECTED Final   Candida albicans NOT DETECTED NOT DETECTED Final   Candida glabrata NOT DETECTED NOT DETECTED Final   Candida krusei NOT DETECTED NOT DETECTED Final   Candida parapsilosis NOT DETECTED NOT DETECTED Final    Candida tropicalis NOT DETECTED NOT DETECTED Final  Culture, blood (Routine X 2) w Reflex to ID Panel     Status: Abnormal   Collection Time: 03/02/16  2:15 AM  Result Value Ref Range Status   Specimen Description BLOOD LEFT HAND  Final   Special Requests IN PEDIATRIC BOTTLE 2CC  Final   Culture  Setup Time   Final    GRAM POSITIVE COCCI IN CLUSTERS IN PEDIATRIC BOTTLE CRITICAL VALUE NOTED.  VALUE IS CONSISTENT WITH PREVIOUSLY REPORTED AND CALLED VALUE.    Culture (A)  Final    STAPHYLOCOCCUS AUREUS SUSCEPTIBILITIES PERFORMED ON PREVIOUS CULTURE WITHIN THE LAST 5 DAYS.    Report Status 03/05/2016 FINAL  Final  MRSA PCR Screening     Status: Abnormal   Collection Time: 03/02/16  8:34 AM  Result Value Ref Range Status   MRSA by PCR POSITIVE (A) NEGATIVE Final    Comment:        The GeneXpert MRSA Assay (FDA approved for NASAL specimens only), is one component of a comprehensive MRSA colonization surveillance program. It is not intended to diagnose MRSA infection nor to guide or monitor treatment for MRSA infections. RESULT CALLED TO, READ BACK BY AND VERIFIED WITH: R. Greenwalt RN 11:35 03/02/16 ([email or web address])   Aerobic Culture (superficial specimen)     Status: None (Preliminary result)   Collection Time: 03/02/16  2:10 PM  Result Value Ref Range Status   Specimen Description WOUND  Final   Special Requests AVF  Final   Gram Stain NO WBC SEEN NO ORGANISMS SEEN   Final   Culture   Final    MODERATE METHICILLIN RESISTANT STAPHYLOCOCCUS AUREUS   Report Status PENDING  Incomplete   Organism ID, Bacteria METHICILLIN RESISTANT STAPHYLOCOCCUS AUREUS  Final      Susceptibility   Methicillin resistant staphylococcus aureus - MIC*    CIPROFLOXACIN >=8 RESISTANT Resistant     ERYTHROMYCIN >=  8 RESISTANT Resistant     GENTAMICIN <=0.5 SENSITIVE Sensitive     OXACILLIN >=4 RESISTANT Resistant     TETRACYCLINE <=1 SENSITIVE Sensitive     VANCOMYCIN <=0.5 SENSITIVE Sensitive      TRIMETH/SULFA <=10 SENSITIVE Sensitive     CLINDAMYCIN <=0.25 SENSITIVE Sensitive     RIFAMPIN <=0.5 SENSITIVE Sensitive     Inducible Clindamycin NEGATIVE Sensitive     * MODERATE METHICILLIN RESISTANT STAPHYLOCOCCUS AUREUS  Culture, blood (Routine X 2) w Reflex to ID Panel     Status: Abnormal   Collection Time: 03/03/16  4:54 AM  Result Value Ref Range Status   Specimen Description BLOOD RIGHT HAND  Final   Special Requests IN PEDIATRIC BOTTLE 1CC  Final   Culture  Setup Time   Final    GRAM POSITIVE COCCI IN CLUSTERS AEROBIC BOTTLE ONLY CRITICAL VALUE NOTED.  VALUE IS CONSISTENT WITH PREVIOUSLY REPORTED AND CALLED VALUE.    Culture (A)  Final    STAPHYLOCOCCUS AUREUS SUSCEPTIBILITIES PERFORMED ON PREVIOUS CULTURE WITHIN THE LAST 5 DAYS.    Report Status 03/05/2016 FINAL  Final    RADIOLOGY STUDIES/RESULTS: Dg Chest Port 1 View  Result Date: 03/03/2016 CLINICAL DATA:  60 y/o  F; central line placement. EXAM: PORTABLE CHEST 1 VIEW COMPARISON:  None. FINDINGS: Cardiac silhouette within normal limits given projection and technique. Left central venous catheter tip projects over the cavoatrial junction. Clear lungs. No pleural effusion. No pneumothorax. Bones are unremarkable. IMPRESSION: No active disease. Electronically Signed   By: Kristine Garbe M.D.   On: 03/03/2016 04:26   Dg Fluoro Guide Cv Line-no Report  Result Date: 03/03/2016 There is no Radiologist interpretation  for this exam.    LOS: 3 days   Oren Binet, MD  [facility] Pager:[phone]  If 7PM-7AM, please contact night-coverage [email or web address] Password [facility] 03/05/2016, 12:39 PM

## 2016-03-05 NOTE — Care Management Note (Signed)
Case Management Note  Patient Details  Name: RESHA FILIPPONE MRN: [id number] Date of Birth: May 08, 1955  Subjective/Objective:         CM following for progression and d/c planning.            Action/Plan: 03/05/2016 Per PT this pt will not need HHPT, however they are recommending a gel cushion for pt to use at her hemodialysis treatment, due to pressure from long periods of sitting. This CM has requested pt MD to sent as a referrral to CM , so that the order may be given to the pt to take to a medical supply store post d/c.   Expected Discharge Date:                  Expected Discharge Plan:  Home/Self Care  In-House Referral:  NA  Discharge planning Services  CM Consult  Post Acute Care Choice:  Durable Medical Equipment Choice offered to:  Patient  DME Arranged:   (gel cushion ) DME Agency:   (Pt will need to take prescription to medical supply store.)  HH Arranged:  NA [place] Agency:  NA  Status of Service:  In process, will continue to follow  If discussed at Long Length of Stay Meetings, dates discussed:    Additional Comments:  Adron Bene, RN 03/05/2016, 12:15 PM

## 2016-03-05 NOTE — Evaluation (Signed)
Physical Therapy Evaluation Patient Details Name: Rachael George MRN: [id number] DOB: 30-Oct-1955 Today's Date: 03/05/2016   History of Present Illness  Rachael George is a 60 y.o. female with ESRD on hemodialysis on Monday Wednesday and Friday presents to the ER because of worsening pain and swelling of the right arm over the right AV fistula. Patient states the swelling started last week which has gradually worsened.  s/p  Ligation right arm AV fistula, ultrasound neck, insertion palindrome catheter left internal jugular vein.  Clinical Impression  Pt is at or close to baseline functioning and should be safe at home with available assist. There are no further acute PT needs.  Will sign off at this time.     Follow Up Recommendations No PT follow up    Equipment Recommendations  Other (comment) (pt would like basic gel/foam cushion for sitting in HD/car )    Recommendations for Other Services       Precautions / Restrictions Precautions Precautions: None      Mobility  Bed Mobility Overal bed mobility: Independent                Transfers Overall transfer level: Independent                  Ambulation/Gait Ambulation/Gait assistance: Independent Ambulation Distance (Feet): 500 Feet Assistive device: None Gait Pattern/deviations: WFL(Within Functional Limits)   Gait velocity interpretation: >2.62 ft/sec, indicative of independent community ambulator General Gait Details: smooth and steady with ability to maintain community level gait speed.  Stairs Stairs: Yes Stairs assistance: Modified independent (Device/Increase time) Stair Management: One rail Right;Alternating pattern;Forwards Number of Stairs: 7 General stair comments: steady and safe  Wheelchair Mobility    Modified Rankin (Stroke Patients Only)       Balance Overall balance assessment: Independent                               Standardized Balance  Assessment Standardized Balance Assessment : Dynamic Gait Index   Dynamic Gait Index Level Surface: Normal Change in Gait Speed: Normal Gait with Horizontal Head Turns: Normal Gait with Vertical Head Turns: Normal Gait and Pivot Turn: Normal Step Over Obstacle: Normal Step Around Obstacles: Normal Steps: Mild Impairment Total Score: 23       Pertinent Vitals/Pain Pain Assessment: Faces Faces Pain Scale: Hurts a little bit Pain Location: R arm incision Pain Descriptors / Indicators: Sore Pain Intervention(s): Monitored during session    Home Living Family/patient expects to be discharged to:: Private residence Living Arrangements: Children;Spouse/significant other;Other relatives Available Help at Discharge: Family;Available PRN/intermittently Type of Home: Apartment Home Access: Stairs to enter Entrance Stairs-Rails: Right;Left Entrance Stairs-Number of Steps: 3 Home Layout: One level Home Equipment: Walker - 2 wheels;Cane - single point      Prior Function Level of Independence: Independent         Comments: uses a cane when "wiped out" after dialysis.     Hand Dominance        Extremity/Trunk Assessment   Upper Extremity Assessment Upper Extremity Assessment: Overall WFL for tasks assessed    Lower Extremity Assessment Lower Extremity Assessment: Overall WFL for tasks assessed    Cervical / Trunk Assessment Cervical / Trunk Assessment: Normal  Communication   Communication: No difficulties  Cognition Arousal/Alertness: Awake/alert Behavior During Therapy: WFL for tasks assessed/performed Overall Cognitive Status: Within Functional Limits for tasks assessed  General Comments General comments (skin integrity, edema, etc.): not a fall risk    Exercises     Assessment/Plan    PT Assessment Patent does not need any further PT services  PT Problem List            PT Treatment Interventions      PT Goals  (Current goals can be found in the Care Plan section)  Acute Rehab PT Goals Patient Stated Goal: home soon PT Goal Formulation: With patient    Frequency     Barriers to discharge        Co-evaluation               End of Session   Activity Tolerance: Patient tolerated treatment well Patient left: in bed;with call bell/phone within reach Nurse Communication: Mobility status         Time: 1010-1029 PT Time Calculation (min) (ACUTE ONLY): 19 min   Charges:   PT Evaluation $PT Eval Low Complexity: 1 Procedure     PT G CodesTessie Fass Rayya Yagi 03/05/2016, 10:39 AM 03/05/2016  Donnella Sham, PT [phone]  (pager)

## 2016-03-05 NOTE — Progress Notes (Signed)
[facility] Progress Note   Subjective: stable in bed, no c/o's  Vitals:   03/04/16 1741 03/04/16 2107 03/05/16 0534 03/05/16 0945  BP: 133/79 (!) 153/84 137/81 140/76  Pulse: 97 93 93 94  Resp: 17 17 17 18   Temp: 99.4 F (37.4 C) 98.2 F (36.8 C) 98.2 F (36.8 C) 98.5 F (36.9 C)  TempSrc: Oral Oral Oral Oral  SpO2: 100% 99% 100% 100%  Weight:  83.5 kg (184 lb 1.4 oz)    Height:        Inpatient medications: . allopurinol  100 mg Oral BID  . atorvastatin  80 mg Oral Daily  . calcitRIOL  2 mcg Oral Q M,W,F  . Chlorhexidine Gluconate Cloth  6 each Topical Q0600  . cinacalcet  30 mg Oral Q breakfast  . famotidine  20 mg Oral QHS  . multivitamin  1 tablet Oral QHS  . mupirocin ointment  1 application Nasal BID  . pantoprazole  40 mg Oral BID  . sevelamer carbonate  1,600 mg Oral TID WC  . vancomycin  750 mg Intravenous Q T,Th,Sa-HD    acetaminophen **OR** acetaminophen, albuterol, morphine injection, ondansetron **OR** ondansetron (ZOFRAN) IV, oxyCODONE, oxyCODONE-acetaminophen  Exam: General: Well developed, well nourished Neck: Supple. JVD not elevated. Lungs: Clear bilaterally to auscultation without wheezes, rales, or rhonchi Heart: RRR with S1 S2. No murmurs, rubs, or gallops appreciated. Abdomen: Soft, non-tender, non-distended with normoactive bowel sounds Lower extremities:without edema or ischemic changes Dialysis Access: RUA AVF wrapped, L IJ TDC  Dialysis Orders: [facility] MWF  4h  81kg  2/2 bath  Hep 7000   L IJ TDC (new this admit)/ R AVF (ligated) Calcitriol 2.0 mcg PO q treatment (last PTH 407 01/29/16)  BMD medications:  Sevelamer 800 mg 4 tablets PO TID w/meals Sensipar 30 mg PO daily   Recent labs: HGB 11.9 (02/26/16) Fe 55 Tsat 25% 01/29/16   Assessment: 1.  Infected RUA AVF: s/p ligation AVF w resection necrotic tissue. 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  For TEE tomorrow.  On vanc.  2. ESRD - MWF  HD, off schedule, HD today and then prob Sat 3.  Hypertension/volume  - No antihypertensive meds on OP med list. Close to dry wt 4.  Anemia  - HGB 11.7. No OP ESA. None needed now. Follow HGB.  5.  Metabolic bone disease -  Cont binders/VDRA 6.  Nutrition - Renal diet w/fld restrictions/nepro/renal vits.    Plan - HD today, vanc, TEE tomorrow   Kelly Splinter MD [facility] pager [phone]   03/05/2016, 11:22 AM    Recent Labs Lab 03/02/16 0836 03/03/16 0454 03/04/16 0857  NA 134* 135 136  K 6.0* 4.7 3.9  CL 99* 101 98*  CO2 20* 21* 28  GLUCOSE 103* 133* 97  BUN 70* 72* 37*  CREATININE 10.54* 11.00* 7.75*  CALCIUM 8.9 7.8* 8.5*  PHOS  --   --  5.2*    Recent Labs Lab 03/04/16 0857  ALBUMIN 2.8*    Recent Labs Lab 03/02/16 0105 03/02/16 0836 03/03/16 0053 03/03/16 0454 03/04/16 0915  WBC 8.3 7.3  --  9.6 6.1  NEUTROABS 7.0  --   --   --   --   HGB 11.6* 11.7* 8.3* 8.6* 9.1*  HCT 35.4* 36.2 25.3* 25.8* 26.8*  MCV 80.6 80.8  --  81.6 79.5  PLT 121* 104*  --  73* 75*   Iron/TIBC/Ferritin/ %Sat    Component  Value Date/Time   IRON 46 10/04/2014 1016   TIBC 205 (L) 10/04/2014 1016   FERRITIN 1,168 (H) 10/04/2014 1016   IRONPCTSAT 22 10/04/2014 1016

## 2016-03-05 NOTE — Progress Notes (Addendum)
[facility] Progress Note  Subjective  - POD #2  Minimal pain this am right upper arm.   Objective Vitals:   03/04/16 2107 03/05/16 0534  BP: (!) 153/84 137/81  Pulse: 93 93  Resp: 17 17  Temp: 98.2 F (36.8 C) 98.2 F (36.8 C)    Intake/Output Summary (Last 24 hours) at 03/05/16 9480 Last data filed at 03/05/16 0600  Gross per 24 hour  Intake              840 ml  Output                0 ml  Net              840 ml   Right upper arm with seroma at medial aspect of proximal incision. Mild serosanguinous drainage on dressing. No active drainage.  Mild erythema distal to incision. Sutures are intact.  Easily palpable right radial pulse.  Left IJ TDC.   Assessment/Planning: 60 y.o. female is s/p: ligation right upper arm AVF, insertion of left IJ TDC 2 Days Post-Op   Continue to monitor right upper arm seroma. No plans for I & D at this am. Continue dry dressing. Continue abx.  Repeat blood cultures pending.  TTE without vegetations.  Left arm vein mapping shows potential options for a fistula. Will plan on placing once recovered from current medical issues in about a month.   Alvia Grove 03/05/2016 8:32 AM --  Laboratory CBC    Component Value Date/Time   WBC 6.1 03/04/2016 0915   HGB 9.1 (L) 03/04/2016 0915   HCT 26.8 (L) 03/04/2016 0915   PLT 75 (L) 03/04/2016 0915    BMET    Component Value Date/Time   NA 136 03/04/2016 0857   K 3.9 03/04/2016 0857   CL 98 (L) 03/04/2016 0857   CO2 28 03/04/2016 0857   GLUCOSE 97 03/04/2016 0857   BUN 37 (H) 03/04/2016 0857   CREATININE 7.75 (H) 03/04/2016 0857   CREATININE 3.71 (H) 10/04/2014 1016   CALCIUM 8.5 (L) 03/04/2016 0857   GFRNONAA 5 (L) 03/04/2016 0857   GFRNONAA 13 (L) 10/04/2014 1016   GFRAA 6 (L) 03/04/2016 0857   GFRAA 15 (L) 10/04/2014 1016    COAG Lab Results  Component Value Date   INR 1.19 03/03/2016   No results found for: PTT  Antibiotics Anti-infectives    Start     Dose/Rate Route Frequency Ordered Stop   03/05/16 1200  vancomycin (VANCOCIN) IVPB 750 mg/150 ml premix     750 mg 150 mL/hr over 60 Minutes Intravenous Every T-Th-Sa (Hemodialysis) 03/04/16 1146 03/07/16 1159   03/03/16 1415  vancomycin (VANCOCIN) IVPB 750 mg/150 ml premix     750 mg 150 mL/hr over 60 Minutes Intravenous Every T-Th-Sa (Hemodialysis) 03/03/16 1412 03/03/16 1612   03/03/16 1200  vancomycin (VANCOCIN) IVPB 750 mg/150 ml premix  Status:  Discontinued     750 mg 150 mL/hr over 60 Minutes Intravenous  Once 03/03/16 1121 03/03/16 1412   03/03/16 0600  vancomycin (VANCOCIN) IVPB 1000 mg/200 mL premix  Status:  Discontinued     1,000 mg 200 mL/hr over 60 Minutes Intravenous On call to O.R. 03/02/16 1554 03/02/16 1602   03/02/16 2200  ceFEPIme (MAXIPIME) 1 g in dextrose 5 % 50 mL IVPB  Status:  Discontinued     1 g 100 mL/hr over 30 Minutes Intravenous Every 24 hours 03/02/16 0415 03/02/16 1922   03/02/16  1200  vancomycin (VANCOCIN) IVPB 750 mg/150 ml premix  Status:  Discontinued     750 mg 150 mL/hr over 60 Minutes Intravenous Every M-W-F (Hemodialysis) 03/02/16 0415 03/04/16 1148   03/02/16 0430  vancomycin (VANCOCIN) IVPB 750 mg/150 ml premix     750 mg 150 mL/hr over 60 Minutes Intravenous  Once 03/02/16 0415 03/02/16 0621   03/02/16 0430  ceFEPIme (MAXIPIME) 1 g in dextrose 5 % 50 mL IVPB     1 g 100 mL/hr over 30 Minutes Intravenous  Once 03/02/16 0415 03/02/16 0549   03/02/16 0045  vancomycin (VANCOCIN) IVPB 1000 mg/200 mL premix     1,000 mg 200 mL/hr over 60 Minutes Intravenous  Once 03/02/16 0032 03/02/16 0319       Virgina Jock, PA-C [facility] Office: [phone] Pager: [phone] 03/05/2016 8:32 AM   Will plan for left basilic vein transposition once infectious issues resolved.  I will have her follow up in the office next month [place] [facility] Defilippo

## 2016-03-05 NOTE — Progress Notes (Signed)
INFECTIOUS DISEASE PROGRESS NOTE  ID: Rachael George is a 60 y.o. female with  Principal Problem:   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HCC) Active Problems:   End stage renal disease (HCC)   Anemia in chronic kidney disease   Acute blood loss anemia   Hypovolemic shock (HCC)   Staphylococcus aureus bacteremia  Subjective: No complaints, in HD  Abtx:  Anti-infectives    Start     Dose/Rate Route Frequency Ordered Stop   03/05/16 1200  vancomycin (VANCOCIN) IVPB 750 mg/150 ml premix     750 mg 150 mL/hr over 60 Minutes Intravenous Every T-Th-Sa (Hemodialysis) 03/04/16 1146 03/07/16 1159   03/03/16 1415  vancomycin (VANCOCIN) IVPB 750 mg/150 ml premix     750 mg 150 mL/hr over 60 Minutes Intravenous Every T-Th-Sa (Hemodialysis) 03/03/16 1412 03/03/16 1612   03/03/16 1200  vancomycin (VANCOCIN) IVPB 750 mg/150 ml premix  Status:  Discontinued     750 mg 150 mL/hr over 60 Minutes Intravenous  Once 03/03/16 1121 03/03/16 1412   03/03/16 0600  vancomycin (VANCOCIN) IVPB 1000 mg/200 mL premix  Status:  Discontinued     1,000 mg 200 mL/hr over 60 Minutes Intravenous On call to O.R. 03/02/16 1554 03/02/16 1602   03/02/16 2200  ceFEPIme (MAXIPIME) 1 g in dextrose 5 % 50 mL IVPB  Status:  Discontinued     1 g 100 mL/hr over 30 Minutes Intravenous Every 24 hours 03/02/16 0415 03/02/16 1922   03/02/16 1200  vancomycin (VANCOCIN) IVPB 750 mg/150 ml premix  Status:  Discontinued     750 mg 150 mL/hr over 60 Minutes Intravenous Every M-W-F (Hemodialysis) 03/02/16 0415 03/04/16 1148   03/02/16 0430  vancomycin (VANCOCIN) IVPB 750 mg/150 ml premix     750 mg 150 mL/hr over 60 Minutes Intravenous  Once 03/02/16 0415 03/02/16 0621   03/02/16 0430  ceFEPIme (MAXIPIME) 1 g in dextrose 5 % 50 mL IVPB     1 g 100 mL/hr over 30 Minutes Intravenous  Once 03/02/16 0415 03/02/16 0549   03/02/16 0045  vancomycin (VANCOCIN) IVPB 1000 mg/200 mL premix     1,000 mg 200 mL/hr over 60 Minutes  Intravenous  Once 03/02/16 0032 03/02/16 0319      Medications:  Scheduled: . allopurinol  100 mg Oral BID  . atorvastatin  80 mg Oral Daily  . calcitRIOL  2 mcg Oral Q M,W,F  . Chlorhexidine Gluconate Cloth  6 each Topical Q0600  . cinacalcet  30 mg Oral Q breakfast  . famotidine  20 mg Oral QHS  . [START ON 03/06/2016] heparin  5,000 Units Dialysis Once in dialysis  . heparin subcutaneous  5,000 Units Subcutaneous Q8H  . heparin  7,000 Units Dialysis Once in dialysis  . multivitamin  1 tablet Oral QHS  . mupirocin ointment  1 application Nasal BID  . pantoprazole  40 mg Oral BID  . sevelamer carbonate  1,600 mg Oral TID WC  . vancomycin  750 mg Intravenous Q T,Th,Sa-HD    Objective: Vital signs in last 24 hours: Temp:  [98 F (36.7 C)-99.4 F (37.4 C)] 98 F (36.7 C) (12/14 1155) Pulse Rate:  [72-97] 89 (12/14 1427) Resp:  [13-18] 18 (12/14 1427) BP: (133-159)/(76-95) 145/87 (12/14 1427) SpO2:  [99 %-100 %] 100 % (12/14 1155) Weight:  [82.6 kg (182 lb 1.6 oz)-83.5 kg (184 lb 1.4 oz)] 82.6 kg (182 lb 1.6 oz) (12/14 1155)   General appearance: alert, cooperative and no distress Resp:  clear to auscultation bilaterally Cardio: regular rate and rhythm GI: normal findings: bowel sounds normal and soft, non-tender Extremities: RUE dressed.   Lab Results  Recent Labs  03/04/16 0857 03/04/16 0915 03/05/16 1104  WBC  --  6.1 4.8  HGB  --  9.1* 8.5*  HCT  --  26.8* 25.2*  NA 136  --  137  K 3.9  --  4.0  CL 98*  --  99*  CO2 28  --  26  BUN 37*  --  53*  CREATININE 7.75*  --  9.34*   Liver Panel  Recent Labs  03/04/16 0857 03/05/16 1104  ALBUMIN 2.8* 2.7*   Sedimentation Rate No results for input(s): ESRSEDRATE in the last 72 hours. C-Reactive Protein No results for input(s): CRP in the last 72 hours.  Microbiology: Recent Results (from the past 240 hour(s))  Culture, blood (Routine X 2) w Reflex to ID Panel     Status: Abnormal   Collection Time:  03/02/16  1:10 AM  Result Value Ref Range Status   Specimen Description BLOOD LEFT ARM  Final   Special Requests IN PEDIATRIC BOTTLE 4CC  Final   Culture  Setup Time   Final    GRAM POSITIVE COCCI IN CLUSTERS IN PEDIATRIC BOTTLE CRITICAL RESULT CALLED TO, READ BACK BY AND VERIFIED WITH: LPablo Lawrence.D. 16:10 03/02/16 ([email or web address])    Culture METHICILLIN RESISTANT STAPHYLOCOCCUS AUREUS (A)  Final   Report Status 03/04/2016 FINAL  Final   Organism ID, Bacteria METHICILLIN RESISTANT STAPHYLOCOCCUS AUREUS  Final      Susceptibility   Methicillin resistant staphylococcus aureus - MIC*    CIPROFLOXACIN >=8 RESISTANT Resistant     ERYTHROMYCIN >=8 RESISTANT Resistant     GENTAMICIN <=0.5 SENSITIVE Sensitive     OXACILLIN >=4 RESISTANT Resistant     TETRACYCLINE <=1 SENSITIVE Sensitive     VANCOMYCIN <=0.5 SENSITIVE Sensitive     TRIMETH/SULFA <=10 SENSITIVE Sensitive     CLINDAMYCIN <=0.25 SENSITIVE Sensitive     RIFAMPIN <=0.5 SENSITIVE Sensitive     Inducible Clindamycin NEGATIVE Sensitive     * METHICILLIN RESISTANT STAPHYLOCOCCUS AUREUS  Blood Culture ID Panel (Reflexed)     Status: Abnormal   Collection Time: 03/02/16  1:10 AM  Result Value Ref Range Status   Enterococcus species NOT DETECTED NOT DETECTED Final   Listeria monocytogenes NOT DETECTED NOT DETECTED Final   Staphylococcus species DETECTED (A) NOT DETECTED Final    Comment: CRITICAL RESULT CALLED TO, READ BACK BY AND VERIFIED WITH: LPablo Lawrence.D. 16:10 03/02/16 ([email or web address])    Staphylococcus aureus DETECTED (A) NOT DETECTED Final    Comment: CRITICAL RESULT CALLED TO, READ BACK BY AND VERIFIED WITH: LPablo Lawrence.D. 16:10 03/02/16 ([email or web address])    Methicillin resistance DETECTED (A) NOT DETECTED Final    Comment: CRITICAL RESULT CALLED TO, READ BACK BY AND VERIFIED WITH: LPablo Lawrence.D. 16:10 03/02/16 ([email or web address])    Streptococcus species NOT DETECTED NOT DETECTED Final   Streptococcus agalactiae NOT DETECTED NOT  DETECTED Final   Streptococcus pneumoniae NOT DETECTED NOT DETECTED Final   Streptococcus pyogenes NOT DETECTED NOT DETECTED Final   Acinetobacter baumannii NOT DETECTED NOT DETECTED Final   Enterobacteriaceae species NOT DETECTED NOT DETECTED Final   Enterobacter cloacae complex NOT DETECTED NOT DETECTED Final   Escherichia coli NOT DETECTED NOT DETECTED Final   Klebsiella oxytoca NOT DETECTED NOT DETECTED Final   Klebsiella pneumoniae NOT DETECTED NOT DETECTED Final   Proteus species NOT DETECTED  NOT DETECTED Final   Serratia marcescens NOT DETECTED NOT DETECTED Final   Haemophilus influenzae NOT DETECTED NOT DETECTED Final   Neisseria meningitidis NOT DETECTED NOT DETECTED Final   Pseudomonas aeruginosa NOT DETECTED NOT DETECTED Final   Candida albicans NOT DETECTED NOT DETECTED Final   Candida glabrata NOT DETECTED NOT DETECTED Final   Candida krusei NOT DETECTED NOT DETECTED Final   Candida parapsilosis NOT DETECTED NOT DETECTED Final   Candida tropicalis NOT DETECTED NOT DETECTED Final  Culture, blood (Routine X 2) w Reflex to ID Panel     Status: Abnormal   Collection Time: 03/02/16  2:15 AM  Result Value Ref Range Status   Specimen Description BLOOD LEFT HAND  Final   Special Requests IN PEDIATRIC BOTTLE 2CC  Final   Culture  Setup Time   Final    GRAM POSITIVE COCCI IN CLUSTERS IN PEDIATRIC BOTTLE CRITICAL VALUE NOTED.  VALUE IS CONSISTENT WITH PREVIOUSLY REPORTED AND CALLED VALUE.    Culture (A)  Final    STAPHYLOCOCCUS AUREUS SUSCEPTIBILITIES PERFORMED ON PREVIOUS CULTURE WITHIN THE LAST 5 DAYS.    Report Status 03/05/2016 FINAL  Final  MRSA PCR Screening     Status: Abnormal   Collection Time: 03/02/16  8:34 AM  Result Value Ref Range Status   MRSA by PCR POSITIVE (A) NEGATIVE Final    Comment:        The GeneXpert MRSA Assay (FDA approved for NASAL specimens only), is one component of a comprehensive MRSA colonization surveillance program. It is not intended  to diagnose MRSA infection nor to guide or monitor treatment for MRSA infections. RESULT CALLED TO, READ BACK BY AND VERIFIED WITH: R. Greenwalt RN 11:35 03/02/16 ([email or web address])   Aerobic Culture (superficial specimen)     Status: None   Collection Time: 03/02/16  2:10 PM  Result Value Ref Range Status   Specimen Description WOUND  Final   Special Requests AVF  Final   Gram Stain NO WBC SEEN NO ORGANISMS SEEN   Final   Culture   Final    MODERATE METHICILLIN RESISTANT STAPHYLOCOCCUS AUREUS   Report Status 03/05/2016 FINAL  Final   Organism ID, Bacteria METHICILLIN RESISTANT STAPHYLOCOCCUS AUREUS  Final      Susceptibility   Methicillin resistant staphylococcus aureus - MIC*    CIPROFLOXACIN >=8 RESISTANT Resistant     ERYTHROMYCIN >=8 RESISTANT Resistant     GENTAMICIN <=0.5 SENSITIVE Sensitive     OXACILLIN >=4 RESISTANT Resistant     TETRACYCLINE <=1 SENSITIVE Sensitive     VANCOMYCIN <=0.5 SENSITIVE Sensitive     TRIMETH/SULFA <=10 SENSITIVE Sensitive     CLINDAMYCIN <=0.25 SENSITIVE Sensitive     RIFAMPIN <=0.5 SENSITIVE Sensitive     Inducible Clindamycin NEGATIVE Sensitive     * MODERATE METHICILLIN RESISTANT STAPHYLOCOCCUS AUREUS  Culture, blood (Routine X 2) w Reflex to ID Panel     Status: Abnormal   Collection Time: 03/03/16  4:54 AM  Result Value Ref Range Status   Specimen Description BLOOD RIGHT HAND  Final   Special Requests IN PEDIATRIC BOTTLE 1CC  Final   Culture  Setup Time   Final    GRAM POSITIVE COCCI IN CLUSTERS AEROBIC BOTTLE ONLY CRITICAL VALUE NOTED.  VALUE IS CONSISTENT WITH PREVIOUSLY REPORTED AND CALLED VALUE.    Culture (A)  Final    STAPHYLOCOCCUS AUREUS SUSCEPTIBILITIES PERFORMED ON PREVIOUS CULTURE WITHIN THE LAST 5 DAYS.    Report Status 03/05/2016 FINAL  Final  Culture,  blood (routine x 2)     Status: None (Preliminary result)   Collection Time: 03/04/16  9:15 AM  Result Value Ref Range Status   Specimen Description BLOOD LEFT HAND  Final    Special Requests BOTTLES DRAWN AEROBIC ONLY  10CC  Final   Culture NO GROWTH 1 DAY  Final   Report Status PENDING  Incomplete  Culture, blood (routine x 2)     Status: None (Preliminary result)   Collection Time: 03/04/16  9:15 AM  Result Value Ref Range Status   Specimen Description BLOOD LEFT ARM  Final   Special Requests BOTTLES DRAWN AEROBIC ONLY  5CC  Final   Culture NO GROWTH 1 DAY  Final   Report Status PENDING  Incomplete    Studies/Results: No results found.   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   Total days of antibiotics: 4 vanco         [name] Infectious Diseases (pager) [phone] [email or web address] 03/05/2016, 3:07 PM  LOS: 3 days

## 2016-03-05 NOTE — Progress Notes (Signed)
    [facility] has been requested to perform a transesophageal echocardiogram on Rachael George for SBE.  After careful review of history and examination, the risks and benefits of transesophageal echocardiogram have been explained including risks of esophageal damage, perforation (1:10,000 risk), bleeding, pharyngeal hematoma as well as other potential complications associated with conscious sedation including aspiration, arrhythmia, respiratory failure and death. Alternatives to treatment were discussed, questions were answered. Patient is willing to proceed.   Murray Hodgkins, NP  03/05/2016 7:03 PM

## 2016-03-06 ENCOUNTER — Inpatient Hospital Stay (HOSPITAL_COMMUNITY): Payer: Medicare Other

## 2016-03-06 ENCOUNTER — Encounter (HOSPITAL_COMMUNITY): Payer: Self-pay

## 2016-03-06 ENCOUNTER — Encounter (HOSPITAL_COMMUNITY)
Admission: EM | Disposition: A | Discharge status: Home / Self Care | Payer: Self-pay | Source: Home / Self Care | Attending: Internal Medicine

## 2016-03-06 DIAGNOSIS — R7881 Bacteremia: Secondary | ICD-10-CM

## 2016-03-06 HISTORY — PX: TEE WITHOUT CARDIOVERSION: SHX5443

## 2016-03-06 LAB — HEPATITIS B SURFACE ANTIGEN: Hepatitis B Surface Ag: NEGATIVE

## 2016-03-06 SURGERY — ECHOCARDIOGRAM, TRANSESOPHAGEAL
Anesthesia: Moderate Sedation

## 2016-03-06 MED ORDER — FENTANYL CITRATE (PF) 100 MCG/2ML IJ SOLN
INTRAMUSCULAR | Status: DC | PRN
  Administered 2016-03-06: 12.5 ug via INTRAVENOUS
  Administered 2016-03-06: 25 ug via INTRAVENOUS

## 2016-03-06 MED ORDER — BUTAMBEN-TETRACAINE-BENZOCAINE 2-2-14 % EX AERO
INHALATION_SPRAY | CUTANEOUS | Status: DC | PRN
  Administered 2016-03-06: 2 via TOPICAL

## 2016-03-06 MED ORDER — FENTANYL CITRATE (PF) 100 MCG/2ML IJ SOLN
INTRAMUSCULAR | Status: AC
  Filled 2016-03-06: qty 2

## 2016-03-06 MED ORDER — VANCOMYCIN IV (FOR PTA / DISCHARGE USE ONLY): 1000.0000 mg | INTRAVENOUS | 0 refills | Status: DC

## 2016-03-06 MED ORDER — MIDAZOLAM HCL 5 MG/ML IJ SOLN
INTRAMUSCULAR | Status: AC
  Filled 2016-03-06: qty 2

## 2016-03-06 MED ORDER — DIPHENHYDRAMINE HCL 50 MG/ML IJ SOLN
INTRAMUSCULAR | Status: AC
  Filled 2016-03-06: qty 1

## 2016-03-06 MED ORDER — MIDAZOLAM HCL 10 MG/2ML IJ SOLN
INTRAMUSCULAR | Status: DC | PRN
  Administered 2016-03-06 (×2): 2 mg via INTRAVENOUS

## 2016-03-06 NOTE — H&P (View-Only) (Signed)
[place] for Infectious Disease  Date of Admission:  03/01/2016  Date of Consult:  03/02/2016  Reason for Consult: Staph bacteremia Referring Physician: CHAMP  Impression/Recommendation Staph bacteremia Would stop cefepime Continue vancomycin Defer to vascular regarding further w/u, need for imaging, debridement. The finding of pus and her recurrent fever suggest she could need I & D.  Temporary HD line.  Repeat BCx TEE Check HIV   Thank you so much for this interesting consult,   [name] (pager) [phone] [email or web address]  Rachael George is an 60 y.o. female.  HPI: 60 yo F with hx of DM2, RA, OSA, ESRD on txp list, and R basilic vein fistula creation on 12-06-14 and 37-54-36 complicated by bleeding, hematoma, and return, repair. She recovered and was able to get HD through her site.  She states that periodically she has had decreasing flow in her fistula and had to have it opened up.  She comes to ED on 12-11 with swelIling and pain in her fistula site over the last week. She denies f/c.  In ED her temp was 101.9, WBC 8.3, and was felt to have cellulitis over her fistula.  She was started on vanco/cefepime and has had vascular f/u. In HD, her fistula drained pus when cannulated in hospital. Wound Cx sent.   Past Medical History:  Diagnosis Date  . Anemia   . Asthma   . GERD (gastroesophageal reflux disease)   . Gout   . Heavy menstrual bleeding   . High cholesterol   . History of blood transfusion   . Hyperlipemia 12/07/2012  . Hypertension   . Kidney disease   . Kidney stones   . Prediabetes   . RA (rheumatoid arthritis) ([place])   . Sleep apnea     Past Surgical History:  Procedure Laterality Date  . ABDOMINAL HYSTERECTOMY  2000  . BASCILIC VEIN TRANSPOSITION Right 12/06/2014   Procedure: FIRST STAGE BASILIC VEIN TRANSPOSITION - RIGHT;  Surgeon: Serafina Mitchell, MD;  Location: [place];  Service: Vascular;  Laterality: Right;  .  BASCILIC VEIN TRANSPOSITION Right 02/07/2015   Procedure: RIGHT ARM SECOND STAGE BASILIC VEIN TRANSPOSITION;  Surgeon: Serafina Mitchell, MD;  Location: [place];  Service: Vascular;  Laterality: Right;  . ECTOPIC PREGNANCY SURGERY  02/1982  . I&D EXTREMITY Right 02/07/2015   Procedure: IRRIGATION AND DEBRIDEMENT RIGHT ARM HEMATOMA;  Surgeon: Serafina Mitchell, MD;  Location: [facility] OR;  Service: Vascular;  Laterality: Right;  . left shoulder surgery  08/2005  . TUBAL LIGATION  11/1986     Allergies  Allergen Reactions  . Atacand [Candesartan] Anaphylaxis  . Lisinopril Anaphylaxis  . Motrin [Ibuprofen] Anaphylaxis  . Nsaids Anaphylaxis  . Tolmetin Anaphylaxis  . Penicillins Rash    Has patient had a PCN reaction causing immediate rash, facial/tongue/throat swelling, SOB or lightheadedness with hypotension: No Has patient had a PCN reaction causing severe rash involving mucus membranes or skin necrosis: No Has patient had a PCN reaction that required hospitalization No Has patient had a PCN reaction occurring within the last 10 years: No If all of the above answers are "NO", then may proceed with Cephalosporin use.     Medications:  Scheduled: . allopurinol  100 mg Oral BID  . atorvastatin  80 mg Oral Daily  . calcium-vitamin D  4 tablet Oral Q M,W,F-HD  . ceFEPime (MAXIPIME) IV  1 g Intravenous Q24H  . Chlorhexidine Gluconate Cloth  6 each Topical Q0600  .  cinacalcet  30 mg Oral Q breakfast  . [START ON 03/03/2016] famotidine  20 mg Oral QHS  . heparin subcutaneous  5,000 Units Subcutaneous Q8H  . multivitamin  1 tablet Oral QHS  . mupirocin ointment  1 application Nasal BID  . pantoprazole  40 mg Oral BID  . sevelamer carbonate  1,600 mg Oral TID WC  . vancomycin  750 mg Intravenous Q M,W,F-HD    Abtx:  Anti-infectives    Start     Dose/Rate Route Frequency Ordered Stop   03/03/16 0600  vancomycin (VANCOCIN) IVPB 1000 mg/200 mL premix  Status:  Discontinued     1,000 mg 200 mL/hr  over 60 Minutes Intravenous On call to O.R. 03/02/16 1554 03/02/16 1602   03/02/16 2200  ceFEPIme (MAXIPIME) 1 g in dextrose 5 % 50 mL IVPB     1 g 100 mL/hr over 30 Minutes Intravenous Every 24 hours 03/02/16 0415     03/02/16 1200  vancomycin (VANCOCIN) IVPB 750 mg/150 ml premix     750 mg 150 mL/hr over 60 Minutes Intravenous Every M-W-F (Hemodialysis) 03/02/16 0415     03/02/16 0430  vancomycin (VANCOCIN) IVPB 750 mg/150 ml premix     750 mg 150 mL/hr over 60 Minutes Intravenous  Once 03/02/16 0415 03/02/16 0621   03/02/16 0430  ceFEPIme (MAXIPIME) 1 g in dextrose 5 % 50 mL IVPB     1 g 100 mL/hr over 30 Minutes Intravenous  Once 03/02/16 0415 03/02/16 0549   03/02/16 0045  vancomycin (VANCOCIN) IVPB 1000 mg/200 mL premix     1,000 mg 200 mL/hr over 60 Minutes Intravenous  Once 03/02/16 0032 03/02/16 0319      Total days of antibiotics: 1 vanco/cefepime          Social History:  reports that she has never smoked. She has never used smokeless tobacco. She reports that she drinks alcohol. She reports that she does not use drugs.  Family History  Problem Relation Age of Onset  . Hypertension Mother   . Breast cancer Maternal Aunt   . Deep vein thrombosis Daughter   . Hyperlipidemia Daughter   . Hypertension Daughter   . Prostate cancer Maternal Grandmother     General ROS: deines F/C, no urine, prev normal BM, no change in wt, normal apetite, floaters in vision. see HPI.   Blood pressure 125/65, pulse (!) 117, temperature (!) 103.1 F (39.5 C), temperature source Oral, resp. rate 18, height 5' 2"  (1.575 m), weight 84 kg (185 lb 3 oz), SpO2 100 %. General appearance: alert, cooperative and no distress Eyes: negative findings: conjunctivae and sclerae normal and pupils equal, round, reactive to light and accomodation Throat: lips, mucosa, and tongue normal; teeth and gums normal Neck: no adenopathy and supple, symmetrical, trachea midline Lungs: clear to auscultation  bilaterally Heart: regular rate and rhythm Abdomen: normal findings: bowel sounds normal and soft, non-tender Extremities: none in BLE. RUE AVF is grossly swollen, erythematous. Warm.    Results for orders placed or performed during the hospital encounter of 03/01/16 (from the past 48 hour(s))  CBC with Differential     Status: Abnormal   Collection Time: 03/02/16  1:05 AM  Result Value Ref Range   WBC 8.3 4.0 - 10.5 K/uL   RBC 4.39 3.87 - 5.11 MIL/uL   Hemoglobin 11.6 (L) 12.0 - 15.0 g/dL   HCT 35.4 (L) 36.0 - 46.0 %   MCV 80.6 78.0 - 100.0 fL   MCH 26.4  26.0 - 34.0 pg   MCHC 32.8 30.0 - 36.0 g/dL   RDW 16.6 (H) 11.5 - 15.5 %   Platelets 121 (L) 150 - 400 K/uL   Neutrophils Relative % 85 %   Neutro Abs 7.0 1.7 - 7.7 K/uL   Lymphocytes Relative 10 %   Lymphs Abs 0.8 0.7 - 4.0 K/uL   Monocytes Relative 5 %   Monocytes Absolute 0.4 0.1 - 1.0 K/uL   Eosinophils Relative 0 %   Eosinophils Absolute 0.0 0.0 - 0.7 K/uL   Basophils Relative 0 %   Basophils Absolute 0.0 0.0 - 0.1 K/uL  Basic metabolic panel     Status: Abnormal   Collection Time: 03/02/16  1:05 AM  Result Value Ref Range   Sodium 136 135 - 145 mmol/L   Potassium 5.5 (H) 3.5 - 5.1 mmol/L   Chloride 100 (L) 101 - 111 mmol/L   CO2 21 (L) 22 - 32 mmol/L   Glucose, Bld 100 (H) 65 - 99 mg/dL   BUN 68 (H) 6 - 20 mg/dL   Creatinine, Ser 10.03 (H) 0.44 - 1.00 mg/dL   Calcium 9.1 8.9 - 10.3 mg/dL   GFR calc non Af Amer 4 (L) >60 mL/min   GFR calc Af Amer 4 (L) >60 mL/min    Comment: (NOTE) The eGFR has been calculated using the CKD EPI equation. This calculation has not been validated in all clinical situations. eGFR's persistently <60 mL/min signify possible Chronic Kidney Disease.    Anion gap 15 5 - 15  Culture, blood (Routine X 2) w Reflex to ID Panel     Status: None (Preliminary result)   Collection Time: 03/02/16  1:10 AM  Result Value Ref Range   Specimen Description BLOOD LEFT ARM    Special Requests IN  PEDIATRIC BOTTLE 4CC    Culture  Setup Time      GRAM POSITIVE COCCI IN CLUSTERS IN PEDIATRIC BOTTLE Organism ID to follow CRITICAL RESULT CALLED TO, READ BACK BY AND VERIFIED WITH: LPablo Lawrence.D. 16:10 03/02/16 ([email or web address])    Culture PENDING    Report Status PENDING   Blood Culture ID Panel (Reflexed)     Status: Abnormal   Collection Time: 03/02/16  1:10 AM  Result Value Ref Range   Enterococcus species NOT DETECTED NOT DETECTED   Listeria monocytogenes NOT DETECTED NOT DETECTED   Staphylococcus species DETECTED (A) NOT DETECTED    Comment: CRITICAL RESULT CALLED TO, READ BACK BY AND VERIFIED WITH: LPablo Lawrence.D. 16:10 03/02/16 (wilsonm)    Staphylococcus aureus DETECTED (A) NOT DETECTED    Comment: CRITICAL RESULT CALLED TO, READ BACK BY AND VERIFIED WITH: LPablo Lawrence.D. 16:10 03/02/16 (wilsonm)    Methicillin resistance DETECTED (A) NOT DETECTED    Comment: CRITICAL RESULT CALLED TO, READ BACK BY AND VERIFIED WITH: LPablo Lawrence.D. 16:10 03/02/16 (wilsonm)    Streptococcus species NOT DETECTED NOT DETECTED   Streptococcus agalactiae NOT DETECTED NOT DETECTED   Streptococcus pneumoniae NOT DETECTED NOT DETECTED   Streptococcus pyogenes NOT DETECTED NOT DETECTED   Acinetobacter baumannii NOT DETECTED NOT DETECTED   Enterobacteriaceae species NOT DETECTED NOT DETECTED   Enterobacter cloacae complex NOT DETECTED NOT DETECTED   Escherichia coli NOT DETECTED NOT DETECTED   Klebsiella oxytoca NOT DETECTED NOT DETECTED   Klebsiella pneumoniae NOT DETECTED NOT DETECTED   Proteus species NOT DETECTED NOT DETECTED   Serratia marcescens NOT DETECTED NOT DETECTED   Haemophilus influenzae NOT DETECTED NOT DETECTED  Neisseria meningitidis NOT DETECTED NOT DETECTED   Pseudomonas aeruginosa NOT DETECTED NOT DETECTED   Candida albicans NOT DETECTED NOT DETECTED   Candida glabrata NOT DETECTED NOT DETECTED   Candida krusei NOT DETECTED NOT DETECTED   Candida parapsilosis NOT  DETECTED NOT DETECTED   Candida tropicalis NOT DETECTED NOT DETECTED  I-Stat CG4 Lactic Acid, ED     Status: None   Collection Time: 03/02/16  1:16 AM  Result Value Ref Range   Lactic Acid, Venous 1.77 0.5 - 1.9 mmol/L  MRSA PCR Screening     Status: Abnormal   Collection Time: 03/02/16  8:34 AM  Result Value Ref Range   MRSA by PCR POSITIVE (A) NEGATIVE    Comment:        The GeneXpert MRSA Assay (FDA approved for NASAL specimens only), is one component of a comprehensive MRSA colonization surveillance program. It is not intended to diagnose MRSA infection nor to guide or monitor treatment for MRSA infections. RESULT CALLED TO, READ BACK BY AND VERIFIED WITH: R. Greenwalt RN 11:35 03/02/16 ([email or web address])   Basic metabolic panel     Status: Abnormal   Collection Time: 03/02/16  8:36 AM  Result Value Ref Range   Sodium 134 (L) 135 - 145 mmol/L   Potassium 6.0 (H) 3.5 - 5.1 mmol/L   Chloride 99 (L) 101 - 111 mmol/L   CO2 20 (L) 22 - 32 mmol/L   Glucose, Bld 103 (H) 65 - 99 mg/dL   BUN 70 (H) 6 - 20 mg/dL   Creatinine, Ser 10.54 (H) 0.44 - 1.00 mg/dL   Calcium 8.9 8.9 - 10.3 mg/dL   GFR calc non Af Amer 4 (L) >60 mL/min   GFR calc Af Amer 4 (L) >60 mL/min    Comment: (NOTE) The eGFR has been calculated using the CKD EPI equation. This calculation has not been validated in all clinical situations. eGFR's persistently <60 mL/min signify possible Chronic Kidney Disease.    Anion gap 15 5 - 15  CBC     Status: Abnormal   Collection Time: 03/02/16  8:36 AM  Result Value Ref Range   WBC 7.3 4.0 - 10.5 K/uL   RBC 4.48 3.87 - 5.11 MIL/uL   Hemoglobin 11.7 (L) 12.0 - 15.0 g/dL   HCT 36.2 36.0 - 46.0 %   MCV 80.8 78.0 - 100.0 fL   MCH 26.1 26.0 - 34.0 pg   MCHC 32.3 30.0 - 36.0 g/dL   RDW 16.8 (H) 11.5 - 15.5 %   Platelets 104 (L) 150 - 400 K/uL    Comment: REPEATED TO VERIFY PLATELET COUNT CONFIRMED BY SMEAR   Aerobic Culture (superficial specimen)     Status: None  (Preliminary result)   Collection Time: 03/02/16  2:10 PM  Result Value Ref Range   Specimen Description WOUND    Special Requests AVF    Gram Stain NO WBC SEEN NO ORGANISMS SEEN     Culture PENDING    Report Status PENDING       Component Value Date/Time   SDES WOUND 03/02/2016 1410   SPECREQUEST AVF 03/02/2016 1410   CULT PENDING 03/02/2016 1410   REPTSTATUS PENDING 03/02/2016 1410   No results found. Recent Results (from the past 240 hour(s))  Culture, blood (Routine X 2) w Reflex to ID Panel     Status: None (Preliminary result)   Collection Time: 03/02/16  1:10 AM  Result Value Ref Range Status   Specimen Description BLOOD LEFT ARM  Final   Special Requests IN PEDIATRIC BOTTLE 4CC  Final   Culture  Setup Time   Final    GRAM POSITIVE COCCI IN CLUSTERS IN PEDIATRIC BOTTLE Organism ID to follow CRITICAL RESULT CALLED TO, READ BACK BY AND VERIFIED WITH: LPablo Lawrence.D. 16:10 03/02/16 ([email or web address])    Culture PENDING  Incomplete   Report Status PENDING  Incomplete  Blood Culture ID Panel (Reflexed)     Status: Abnormal   Collection Time: 03/02/16  1:10 AM  Result Value Ref Range Status   Enterococcus species NOT DETECTED NOT DETECTED Final   Listeria monocytogenes NOT DETECTED NOT DETECTED Final   Staphylococcus species DETECTED (A) NOT DETECTED Final    Comment: CRITICAL RESULT CALLED TO, READ BACK BY AND VERIFIED WITH: LPablo Lawrence.D. 16:10 03/02/16 (wilsonm)    Staphylococcus aureus DETECTED (A) NOT DETECTED Final    Comment: CRITICAL RESULT CALLED TO, READ BACK BY AND VERIFIED WITH: LPablo Lawrence.D. 16:10 03/02/16 (wilsonm)    Methicillin resistance DETECTED (A) NOT DETECTED Final    Comment: CRITICAL RESULT CALLED TO, READ BACK BY AND VERIFIED WITH: LPablo Lawrence.D. 16:10 03/02/16 (wilsonm)    Streptococcus species NOT DETECTED NOT DETECTED Final   Streptococcus agalactiae NOT DETECTED NOT DETECTED Final   Streptococcus pneumoniae NOT DETECTED NOT  DETECTED Final   Streptococcus pyogenes NOT DETECTED NOT DETECTED Final   Acinetobacter baumannii NOT DETECTED NOT DETECTED Final   Enterobacteriaceae species NOT DETECTED NOT DETECTED Final   Enterobacter cloacae complex NOT DETECTED NOT DETECTED Final   Escherichia coli NOT DETECTED NOT DETECTED Final   Klebsiella oxytoca NOT DETECTED NOT DETECTED Final   Klebsiella pneumoniae NOT DETECTED NOT DETECTED Final   Proteus species NOT DETECTED NOT DETECTED Final   Serratia marcescens NOT DETECTED NOT DETECTED Final   Haemophilus influenzae NOT DETECTED NOT DETECTED Final   Neisseria meningitidis NOT DETECTED NOT DETECTED Final   Pseudomonas aeruginosa NOT DETECTED NOT DETECTED Final   Candida albicans NOT DETECTED NOT DETECTED Final   Candida glabrata NOT DETECTED NOT DETECTED Final   Candida krusei NOT DETECTED NOT DETECTED Final   Candida parapsilosis NOT DETECTED NOT DETECTED Final   Candida tropicalis NOT DETECTED NOT DETECTED Final  MRSA PCR Screening     Status: Abnormal   Collection Time: 03/02/16  8:34 AM  Result Value Ref Range Status   MRSA by PCR POSITIVE (A) NEGATIVE Final    Comment:        The GeneXpert MRSA Assay (FDA approved for NASAL specimens only), is one component of a comprehensive MRSA colonization surveillance program. It is not intended to diagnose MRSA infection nor to guide or monitor treatment for MRSA infections. RESULT CALLED TO, READ BACK BY AND VERIFIED WITH: R. Greenwalt RN 11:35 03/02/16 ([email or web address])   Aerobic Culture (superficial specimen)     Status: None (Preliminary result)   Collection Time: 03/02/16  2:10 PM  Result Value Ref Range Status   Specimen Description WOUND  Final   Special Requests AVF  Final   Gram Stain NO WBC SEEN NO ORGANISMS SEEN   Final   Culture PENDING  Incomplete   Report Status PENDING  Incomplete      03/02/2016, 6:47 PM     LOS: 0 days    Records and images were personally reviewed where available.

## 2016-03-06 NOTE — CV Procedure (Signed)
    PROCEDURE NOTE:  Procedure:  Transesophageal echocardiogram Operator:  Fransico Him, MD Indications:  Bactermia Complications: None  During this procedure the patient is administered a total of Versed 4 mg and Fentanyl 37.5 mg to achieve and maintain moderate conscious sedation.  The patient's heart rate, blood pressure, and oxygen saturation are monitored continuously during the procedure. The period of conscious sedation is 15 minutes, of which I was present face-to-face 100% of this time.  Results: Normal LV size and function Normal RV size and function Normal RA Normal LA and LA appendage Normal TV with mild TR Normal PV with trivial PR Normal MV Trileaflet AV with mildly thickened leaflets and trivial AR Normal interatrial septum with no evidence of shunt by colorflow dopper  Normal thoracic and ascending aorta.  The patient tolerated the procedure well and was transferred back to their room in stable condition.  Signed: Fransico Him, MD [facility]

## 2016-03-06 NOTE — Care Management Note (Signed)
Case Management Note  Patient Details  Name: Rachael George MRN: [id number] Date of Birth: April 30, 1955  Subjective/Objective:     CM following for progression and d/c planning.                Action/Plan: 03/06/2016 Met with pt re obtaining a gel cushion, order and pt insurance info printed and given to pt to take to [facility] store. Location of store verified with pt . We are unable to obtain in the hospital, however hopefully this order and info will enable the pt to obtain the cushion with assistance from her insurance.  Expected Discharge Date:    03/06/2016              Expected Discharge Plan:  Home/Self Care  In-House Referral:  NA  Discharge planning Services  CM Consult  Post Acute Care Choice:  Durable Medical Equipment Choice offered to:  Patient  DME Arranged:   (gel cushion ) DME Agency:   (Pt will need to take prescription to medical supply store.)  HH Arranged:  NA [place] Agency:  NA  Status of Service:  In process, will continue to follow  If discussed at Long Length of Stay Meetings, dates discussed:    Additional Comments:  Adron Bene, RN 03/06/2016, 12:06 PM

## 2016-03-06 NOTE — Interval H&P Note (Signed)
History and Physical Interval Note:  03/06/2016 8:11 AM  Rachael George  has presented today for surgery, with the diagnosis of BACTEREMIA  The various methods of treatment have been discussed with the patient and family. After consideration of risks, benefits and other options for treatment, the patient has consented to  Procedure(s): TRANSESOPHAGEAL ECHOCARDIOGRAM (TEE) (N/A) as a surgical intervention .  The patient's history has been reviewed, patient examined, no change in status, stable for surgery.  I have reviewed the patient's chart and labs.  Questions were answered to the patient's satisfaction.     Fransico Him

## 2016-03-06 NOTE — Progress Notes (Signed)
[name] to be D/C'd Home per MD order.  Discussed prescriptions and follow up appointments with the patient. Prescriptions given to patient, medication list explained in detail. Pt verbalized understanding.  Allergies as of 03/06/2016      Reactions   Atacand [candesartan] Anaphylaxis   Lisinopril Anaphylaxis   Motrin [ibuprofen] Anaphylaxis   Nsaids Anaphylaxis   Tolmetin Anaphylaxis   Penicillins Rash   Has patient had a PCN reaction causing immediate rash, facial/tongue/throat swelling, SOB or lightheadedness with hypotension: No Has patient had a PCN reaction causing severe rash involving mucus membranes or skin necrosis: No Has patient had a PCN reaction that required hospitalization No Has patient had a PCN reaction occurring within the last 10 years: No If all of the above answers are "NO", then may proceed with Cephalosporin use.      Medication List    STOP taking these medications   oxyCODONE-acetaminophen 5-325 MG tablet Commonly known as:  ROXICET     TAKE these medications   albuterol 108 (90 Base) MCG/ACT inhaler Commonly known as:  PROAIR HFA Inhale 2 puffs into the lungs every 6 (six) hours as needed for wheezing.   allopurinol 100 MG tablet Commonly known as:  ZYLOPRIM Take 100 mg by mouth 2 (two) times daily. Reported on 09/02/2015   atorvastatin 80 MG tablet Commonly known as:  LIPITOR Take 1 tablet (80 mg total) by mouth daily.   calcitRIOL 0.5 MCG capsule Commonly known as:  ROCALTROL Take 2 mcg by mouth every Monday, Wednesday, and Friday with hemodialysis. With dialysis   cinacalcet 30 MG tablet Commonly known as:  SENSIPAR Take 30 mg by mouth daily.   colchicine 0.6 MG tablet Take 2 tabs (1.10m) at the onset of a gout flare, may repeat 1 tab (0.618m if symptoms persist   DIALYVITE TABLET Tabs Take 1 tablet by mouth daily.   famotidine 20 MG tablet Commonly known as:  PEPCID Take 20 mg by mouth 2 (two) times daily.   pantoprazole  40 MG tablet Commonly known as:  PROTONIX Take 40 mg by mouth 2 (two) times daily.   sevelamer 800 MG tablet Commonly known as:  RENAGEL Take 1,600-3,200 mg by mouth See admin instructions. 3,200 mg three times a day with meals and 1,600 mg twice a day with snacks   vancomycin IVPB Inject 1,000 mg into the vein every Monday, Wednesday, and Friday with hemodialysis. Indication:  Infected HD access Last Day of Therapy:  03/24/16 Labs - Sunday/Monday:  CBC/D, BMP, and vancomycin trough. Labs - Thursday:  BMP and vancomycin trough Labs - Every other week:  ESR and CRP            Home Infusion Instuctions        Start     Ordered   03/06/16 0000  Home infusion instructions [facility] May follow [facility] Protocol; May administer Cathflo as needed to maintain patency of vascular access device.; Flushing of vascular access device: per [facility]: 0.9% NaCl pre/post medica...    Question Answer Comment  Instructions May follow [facility] [place] Protocol   Instructions May administer Cathflo as needed to maintain patency of vascular access device.   Instructions Flushing of vascular access device: per [facility]: 0.9% NaCl pre/post medication administration and prn patency; Heparin 100 u/ml, 30m23mor implanted ports and Heparin 10u/ml, 30ml26mr all other central venous catheters.   Instructions May follow [facility] Anaphylaxis Protocol for First Dose Administration in the home:  0.9% NaCl at 25-50 ml/hr to maintain IV access for protocol meds. Epinephrine 0.3 ml IV/IM PRN and Benadryl 25-50 IV/IM PRN s/s of anaphylaxis.   Instructions [facility] Infusion Coordinator (RN) to assist per patient IV care needs in the home PRN.      03/06/16 1049      Vitals:   03/06/16 0855 03/06/16 0935  BP: 130/84 (!) 144/86  Pulse: 89 (!) 103  Resp: 19 16  Temp:  99 F (37.2 C)    Skin clean, dry and intact without evidence of skin break down, no evidence of skin tears noted. IV  catheter discontinued intact. Site without signs and symptoms of complications. Dressing and pressure applied. Pt denies pain at this time. No complaints noted.  An After Visit Summary was printed and given to the patient. Patient escorted via [place], and D/C home via private auto.  Dixie Dials RN, BSN

## 2016-03-06 NOTE — Interval H&P Note (Signed)
History and Physical Interval Note:  03/06/2016 7:54 AM  Rachael George  has presented today for surgery, with the diagnosis of BACTEREMIA  The various methods of treatment have been discussed with the patient and family. After consideration of risks, benefits and other options for treatment, the patient has consented to  Procedure(s): TRANSESOPHAGEAL ECHOCARDIOGRAM (TEE) (N/A) as a surgical intervention .  The patient's history has been reviewed, patient examined, no change in status, stable for surgery.  I have reviewed the patient's chart and labs.  Questions were answered to the patient's satisfaction.     Fransico Him

## 2016-03-06 NOTE — Progress Notes (Signed)
Pt. Returned from TEE without diet orders. MD notified. Verbal order to place a renal carb modified diet with 1200 mL Fr. Orders placed and followed. Will continue to monitor.

## 2016-03-06 NOTE — Discharge Summary (Signed)
PATIENT DETAILS Name: Rachael George Age: 60 y.o. Sex: female Date of Birth: 16-Sep-1955 MRN: [id number]. Admitting Physician: Rise Patience, MD [id number], Jarold Song, MD  Admit Date: 03/01/2016 Discharge date: 03/06/2016  Recommendations for Outpatient Follow-up:  1. Follow up with PCP in 1-2 weeks 2. 3 weeks of IV vancomycin from 12/13 3. Follow blood cultures until final 4. Ensure follow-up with vascular surgery-hemodialysis access once bacteremia has been treated. 5. Please obtain vancomycin trough levels/CBC/CMP-weekly and fax results to infectious disease clinic  Admitted From:  Home  Disposition: [place]: No  Equipment/Devices: None  Discharge Condition: Stable  CODE STATUS: FULL CODE  Diet recommendation:  Heart Healthy  Brief Summary: See H&P, Labs, Consult and Test reports for all details in brief, Patient is a 60 y.o. female with history of ESRD who presented with fever, pain and swelling of her right arm around her AV fistula site. Thought to have infection of AV fistula site along with Staph bacteremia. Hospital course complicated by bleeding right arm AV fistula leading to hemorrhagic shock and acute blood loss anemia-requiring emergent ligation of AV fistula on 12/12. See below for further details   Brief Hospital Course: Hemorrhagic shock due to bleeding right arm AV fistula: Occurred 12/12 early am. BP now stable-transiently required pressor support. Received 2 units of PRBC transfusion, and emergently taken by vascular surgery for Ligartion of the AV fistula. 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 No further bleeding-VVS followed closely and managed postop care.  Acute blood loss anemia:due to bleeding right arm fistula-requiring PRBC  transfusion-Monitor Hb-in the outpatient setting  AV fistula Infection with MRSA bacteremia:  started on empiric vancomycin, infectious disease consultation during this hospital stay.TEE 12/15 negative for vegetations. Repeat blood cultures on 12/13 negative so far. Spoke with Dr. Hatcher-recommendations are for 3 weeks of vancomycin from 12/13. Subsequently spoke with nephrologist-Dr. Percell Locus will arrange for antimicrobial therapy with HD   ESRD-on HD MWF: Nephrology consulted for dialysis care-unable to do HD yesterday-has new HD catheter placed 12/12 following ligation of AV fistula.   Hyperkalemia: resolved with Kayexalate  Thrombocytopenia: Mild-follow for now-likely worsened with bacteremia/bleeding. Improving with supportive care, continue to follow CBC while on vancomycin.  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[place]) Active Problems:   End stage renal disease (HCC)   Anemia in chronic kidney disease   Acute blood loss anemia   Hypovolemic shock (HCC)   Staphylococcus aureus bacteremia   Discharge Instructions:  Activity:  As tolerated with Full fall precautions use walker/cane & assistance as needed  Discharge Instructions    Diet - low sodium heart healthy    Complete by:  As directed    Discharge instructions    Complete by:  As directed    Follow with Primary MD  Arnoldo Morale, MD, your primary nephrologist-and your dialysis center at your usual schedule  Dry dressing to the incision site-wrap with Ace bandage-change it daily     and other consultant's as instructed your Hospitalist MD  Please get a complete blood count and chemistry panel checked by your Primary MD at your next visit, and again as instructed by your Primary MD.  Get Medicines reviewed and adjusted: Please take all your medications  with you for your next visit with your Primary MD  Laboratory/radiological data: Please request your Primary MD to go over all hospital tests and procedure/radiological results at the follow up, please ask your Primary MD to get all Hospital records sent to his/her office.  In some cases, they will be blood work, cultures and biopsy results pending at the time of your discharge. Please request that your primary care M.D. follows up on these results.  Also Note the following: If you experience worsening of your admission symptoms, develop shortness of breath, life threatening emergency, suicidal or homicidal thoughts you must seek medical attention immediately by calling 911 or calling your MD immediately  if symptoms less severe.  You must read complete instructions/literature along with all the possible adverse reactions/side effects for all the Medicines you take and that have been prescribed to you. Take any new Medicines after you have completely understood and accpet all the possible adverse reactions/side effects.   Do not drive when taking Pain medications or sleeping medications (Benzodaizepines)  Do not take more than prescribed Pain, Sleep and Anxiety Medications. It is not advisable to combine anxiety,sleep and pain medications without talking with your primary care practitioner  Special Instructions: If you have smoked or chewed Tobacco  in the last 2 yrs please stop smoking, stop any regular Alcohol  and or any Recreational drug use.  Wear Seat belts while driving.  Please note: You were cared for by a hospitalist during your hospital stay. Once you are discharged, your primary care physician will handle any further medical issues. Please note that NO REFILLS for any discharge medications will be authorized once you are discharged, as it is imperative that you return to your primary care physician (or establish a relationship with a primary care physician if you do not have one) for  your post hospital discharge needs so that they can reassess your need for medications and monitor your lab values.   Discharge wound care:    Complete by:  As directed    Dry dressing to incision site-wrap with ACE bandage-change daily   Home infusion instructions [facility] May follow [facility] Dosing Protocol; May administer Cathflo as needed to maintain patency of vascular access device.; Flushing of vascular access device: per [facility] Protocol: 0.9% NaCl pre/post medica...    Complete by:  As directed    Instructions:  May follow [place] Dosing Protocol   Instructions:  May administer Cathflo as needed to maintain patency of vascular access device.   Instructions:  Flushing of vascular access device: per [facility] [place] Protocol: 0.9% NaCl pre/post medication administration and prn patency; Heparin 100 u/ml, 74m for implanted ports and Heparin 10u/ml, 556mfor all other central venous catheters.   Instructions:  May follow [facility] Anaphylaxis Protocol for First Dose Administration in the home: 0.9% NaCl at 25-50 ml/hr to maintain IV access for protocol meds. Epinephrine 0.3 ml IV/IM PRN and Benadryl 25-50 IV/IM PRN s/s of anaphylaxis.   Instructions:  [facility] Coordinator (RN) to assist per patient IV care needs in the home PRN.   Increase activity slowly    Complete by:  As directed      Allergies as of 03/06/2016      Reactions   Atacand [candesartan] Anaphylaxis   Lisinopril Anaphylaxis   Motrin [ibuprofen] Anaphylaxis   Nsaids Anaphylaxis   Tolmetin Anaphylaxis   Penicillins Rash   Has patient had a PCN reaction causing immediate rash, facial/tongue/throat swelling, SOB or  lightheadedness with hypotension: No Has patient had a PCN reaction causing severe rash involving mucus membranes or skin necrosis: No Has patient had a PCN reaction that required hospitalization No Has patient had a PCN reaction occurring within the last 10 years: No If all of the above answers  are "NO", then may proceed with Cephalosporin use.      Medication List    STOP taking these medications   oxyCODONE-acetaminophen 5-325 MG tablet Commonly known as:  ROXICET     TAKE these medications   albuterol 108 (90 Base) MCG/ACT inhaler Commonly known as:  PROAIR HFA Inhale 2 puffs into the lungs every 6 (six) hours as needed for wheezing.   allopurinol 100 MG tablet Commonly known as:  ZYLOPRIM Take 100 mg by mouth 2 (two) times daily. Reported on 09/02/2015   atorvastatin 80 MG tablet Commonly known as:  LIPITOR Take 1 tablet (80 mg total) by mouth daily.   calcitRIOL 0.5 MCG capsule Commonly known as:  ROCALTROL Take 2 mcg by mouth every Monday, Wednesday, and Friday with hemodialysis. With dialysis   cinacalcet 30 MG tablet Commonly known as:  SENSIPAR Take 30 mg by mouth daily.   colchicine 0.6 MG tablet Take 2 tabs (1.9m) at the onset of a gout flare, may repeat 1 tab (0.652m if symptoms persist   DIALYVITE TABLET Tabs Take 1 tablet by mouth daily.   famotidine 20 MG tablet Commonly known as:  PEPCID Take 20 mg by mouth 2 (two) times daily.   pantoprazole 40 MG tablet Commonly known as:  PROTONIX Take 40 mg by mouth 2 (two) times daily.   sevelamer 800 MG tablet Commonly known as:  RENAGEL Take 1,600-3,200 mg by mouth See admin instructions. 3,200 mg three times a day with meals and 1,600 mg twice a day with snacks   vancomycin IVPB Inject 1,000 mg into the vein every Monday, Wednesday, and Friday with hemodialysis. Indication:  Infected HD access Last Day of Therapy:  03/24/16 Labs - Sunday/Monday:  CBC/D, BMP, and vancomycin trough. Labs - Thursday:  BMP and vancomycin trough Labs - Every other week:  ESR and CRP            Home Infusion Instuctions        Start     Ordered   03/06/16 0000  Home infusion instructions [facility] May follow [facility] Dosing Protocol; May administer Cathflo as needed to maintain patency of vascular  access device.; Flushing of vascular access device: per [facility] Protocol: 0.9% NaCl pre/post medica...    Question Answer Comment  Instructions May follow [facility] Dosing Protocol   Instructions May administer Cathflo as needed to maintain patency of vascular access device.   Instructions Flushing of vascular access device: per [facility] Protocol: 0.9% NaCl pre/post medication administration and prn patency; Heparin 100 u/ml, 5ml for implanted ports and Heparin 10u/ml, 5ml for all other central venous catheters.   Instructions May follow [facility] Anaphylaxis Protocol for First Dose Administration in the home: 0.9% NaCl at 25-50 ml/hr to maintain IV access for protocol meds. Epinephrine 0.3 ml IV/IM PRN and Benadryl 25-50 IV/IM PRN s/s of anaphylaxis.   Instructions [facility] Infusion Coordinator (RN) to assist per patient IV care needs in the home PRN.      12 /15/17 1049     Follow-up Information    EnArnoldo MoraleMD. Schedule an appointment as soon as possible for a visit in 2 week(s).   Specialty:  Family Medicine  Contact information: [place] [address] [phone]        Hemodialysis center Follow up.   Why:  At her usual schedule         Allergies  Allergen Reactions  . Atacand [Candesartan] Anaphylaxis  . Lisinopril Anaphylaxis  . Motrin [Ibuprofen] Anaphylaxis  . Nsaids Anaphylaxis  . Tolmetin Anaphylaxis  . Penicillins Rash    Has patient had a PCN reaction causing immediate rash, facial/tongue/throat swelling, SOB or lightheadedness with hypotension: No Has patient had a PCN reaction causing severe rash involving mucus membranes or skin necrosis: No Has patient had a PCN reaction that required hospitalization No Has patient had a PCN reaction occurring within the last 10 years: No If all of the above answers are "NO", then may proceed with Cephalosporin use.     Consultations:   ID, nephrology and vascular surgery  Other  Procedures/Studies: Dg Chest Port 1 View  Result Date: 03/03/2016 CLINICAL DATA:  60 y/o  F; central line placement. EXAM: PORTABLE CHEST 1 VIEW COMPARISON:  None. FINDINGS: Cardiac silhouette within normal limits given projection and technique. Left central venous catheter tip projects over the cavoatrial junction. Clear lungs. No pleural effusion. No pneumothorax. Bones are unremarkable. IMPRESSION: No active disease. Electronically Signed   By: Kristine Garbe M.D.   On: 03/03/2016 04:26   Dg Fluoro Guide Cv Line-no Report  Result Date: 03/03/2016 There is no Radiologist interpretation  for this exam.    TODAY-DAY OF DISCHARGE:  Subjective:   Rachael George today has no headache,no chest abdominal pain,no new weakness tingling or numbness, feels much better wants to go home today.  Objective:   Blood pressure (!) 144/86, pulse (!) 103, temperature 99 F (37.2 C), temperature source Oral, resp. rate 16, height 5' 2"  (1.575 m), weight 80.7 kg (178 lb), SpO2 100 %.  Intake/Output Summary (Last 24 hours) at 03/06/16 1100 Last data filed at 03/06/16 0600  Gross per 24 hour  Intake           389.33 ml  Output             2025 ml  Net         -1635.67 ml   Filed Weights   03/05/16 1530 03/05/16 2110 03/06/16 0730  Weight: 81 kg (178 lb 9.2 oz) 81.1 kg (178 lb 12.7 oz) 80.7 kg (178 lb)    Exam: Awake Alert, Oriented *3, No new F.N deficits, Normal affect [place].AT,PERRAL Supple Neck,No JVD, No cervical lymphadenopathy appriciated.  Symmetrical Chest wall movement, Good air movement bilaterally, CTAB RRR,No Gallops,Rubs or new Murmurs, No Parasternal Heave +ve B.Sounds, Abd Soft, Non tender, No organomegaly appriciated, No rebound -guarding or rigidity. No Cyanosis, Clubbing or edema, No new Rash or bruise   PERTINENT RADIOLOGIC STUDIES: Dg Chest Port 1 View  Result Date: 03/03/2016 CLINICAL DATA:  60 y/o  F; central line placement. EXAM: PORTABLE CHEST 1 VIEW  COMPARISON:  None. FINDINGS: Cardiac silhouette within normal limits given projection and technique. Left central venous catheter tip projects over the cavoatrial junction. Clear lungs. No pleural effusion. No pneumothorax. Bones are unremarkable. IMPRESSION: No active disease. Electronically Signed   By: Kristine Garbe M.D.   On: 03/03/2016 04:26   Dg Fluoro Guide Cv Line-no Report  Result Date: 03/03/2016 There is no Radiologist interpretation  for this exam.    PERTINENT LAB RESULTS: CBC:  Recent Labs  03/04/16 0915 03/05/16 1104  WBC 6.1 4.8  HGB 9.1* 8.5*  HCT 26.8* 25.2*  PLT 75* 90*   CMET CMP     Component Value Date/Time   NA 137 03/05/2016 1104   K 4.0 03/05/2016 1104   CL 99 (L) 03/05/2016 1104   CO2 26 03/05/2016 1104   GLUCOSE 104 (H) 03/05/2016 1104   BUN 53 (H) 03/05/2016 1104   CREATININE 9.34 (H) 03/05/2016 1104   CREATININE 3.71 (H) 10/04/2014 1016   CALCIUM 8.3 (L) 03/05/2016 1104   PROT 6.7 10/04/2014 1016   ALBUMIN 2.7 (L) 03/05/2016 1104   AST 15 10/04/2014 1016   ALT 12 10/04/2014 1016   ALKPHOS 132 (H) 10/04/2014 1016   BILITOT 0.4 10/04/2014 1016   GFRNONAA 4 (L) 03/05/2016 1104   GFRNONAA 13 (L) 10/04/2014 1016   GFRAA 5 (L) 03/05/2016 1104   GFRAA 15 (L) 10/04/2014 1016    GFR Estimated Creatinine Clearance: 6.4 mL/min (by C-G formula based on SCr of 9.34 mg/dL (H)). No results for input(s): LIPASE, AMYLASE in the last 72 hours. No results for input(s): CKTOTAL, CKMB, CKMBINDEX, TROPONINI in the last 72 hours. Invalid input(s): POCBNP No results for input(s): DDIMER in the last 72 hours. No results for input(s): HGBA1C in the last 72 hours. No results for input(s): CHOL, HDL, LDLCALC, TRIG, CHOLHDL, LDLDIRECT in the last 72 hours. No results for input(s): TSH, T4TOTAL, T3FREE, THYROIDAB in the last 72 hours.  Invalid input(s): FREET3 No results for input(s): VITAMINB12, FOLATE, FERRITIN, TIBC, IRON, RETICCTPCT in the last  72 hours. Coags: No results for input(s): INR in the last 72 hours.  Invalid input(s): PT Microbiology: Recent Results (from the past 240 hour(s))  Culture, blood (Routine X 2) w Reflex to ID Panel     Status: Abnormal   Collection Time: 03/02/16  1:10 AM  Result Value Ref Range Status   Specimen Description BLOOD LEFT ARM  Final   Special Requests IN PEDIATRIC BOTTLE 4CC  Final   Culture  Setup Time   Final    GRAM POSITIVE COCCI IN CLUSTERS IN PEDIATRIC BOTTLE CRITICAL RESULT CALLED TO, READ BACK BY AND VERIFIED WITH: LPablo Lawrence.D. 16:10 03/02/16 ([email or web address])    Culture METHICILLIN RESISTANT STAPHYLOCOCCUS AUREUS (A)  Final   Report Status 03/04/2016 FINAL  Final   Organism ID, Bacteria METHICILLIN RESISTANT STAPHYLOCOCCUS AUREUS  Final      Susceptibility   Methicillin resistant staphylococcus aureus - MIC*    CIPROFLOXACIN >=8 RESISTANT Resistant     ERYTHROMYCIN >=8 RESISTANT Resistant     GENTAMICIN <=0.5 SENSITIVE Sensitive     OXACILLIN >=4 RESISTANT Resistant     TETRACYCLINE <=1 SENSITIVE Sensitive     VANCOMYCIN <=0.5 SENSITIVE Sensitive     TRIMETH/SULFA <=10 SENSITIVE Sensitive     CLINDAMYCIN <=0.25 SENSITIVE Sensitive     RIFAMPIN <=0.5 SENSITIVE Sensitive     Inducible Clindamycin NEGATIVE Sensitive     * METHICILLIN RESISTANT STAPHYLOCOCCUS AUREUS  Blood Culture ID Panel (Reflexed)     Status: Abnormal   Collection Time: 03/02/16  1:10 AM  Result Value Ref Range Status   Enterococcus species NOT DETECTED NOT DETECTED Final   Listeria monocytogenes NOT DETECTED NOT DETECTED Final   Staphylococcus species DETECTED (A) NOT DETECTED Final    Comment: CRITICAL RESULT CALLED TO, READ BACK BY AND VERIFIED WITH: LPablo Lawrence.D. 16:10 03/02/16 ([email or web address])    Staphylococcus aureus DETECTED (A) NOT DETECTED Final    Comment: CRITICAL RESULT CALLED TO, READ BACK BY AND VERIFIED WITH: LPablo Lawrence.D. 16:10  03/02/16 ([email or web address])    Methicillin resistance DETECTED  (A) NOT DETECTED Final    Comment: CRITICAL RESULT CALLED TO, READ BACK BY AND VERIFIED WITH: LPablo Lawrence.D. 16:10 03/02/16 ([email or web address])    Streptococcus species NOT DETECTED NOT DETECTED Final   Streptococcus agalactiae NOT DETECTED NOT DETECTED Final   Streptococcus pneumoniae NOT DETECTED NOT DETECTED Final   Streptococcus pyogenes NOT DETECTED NOT DETECTED Final   Acinetobacter baumannii NOT DETECTED NOT DETECTED Final   Enterobacteriaceae species NOT DETECTED NOT DETECTED Final   Enterobacter cloacae complex NOT DETECTED NOT DETECTED Final   Escherichia coli NOT DETECTED NOT DETECTED Final   Klebsiella oxytoca NOT DETECTED NOT DETECTED Final   Klebsiella pneumoniae NOT DETECTED NOT DETECTED Final   Proteus species NOT DETECTED NOT DETECTED Final   Serratia marcescens NOT DETECTED NOT DETECTED Final   Haemophilus influenzae NOT DETECTED NOT DETECTED Final   Neisseria meningitidis NOT DETECTED NOT DETECTED Final   Pseudomonas aeruginosa NOT DETECTED NOT DETECTED Final   Candida albicans NOT DETECTED NOT DETECTED Final   Candida glabrata NOT DETECTED NOT DETECTED Final   Candida krusei NOT DETECTED NOT DETECTED Final   Candida parapsilosis NOT DETECTED NOT DETECTED Final   Candida tropicalis NOT DETECTED NOT DETECTED Final  Culture, blood (Routine X 2) w Reflex to ID Panel     Status: Abnormal   Collection Time: 03/02/16  2:15 AM  Result Value Ref Range Status   Specimen Description BLOOD LEFT HAND  Final   Special Requests IN PEDIATRIC BOTTLE 2CC  Final   Culture  Setup Time   Final    GRAM POSITIVE COCCI IN CLUSTERS IN PEDIATRIC BOTTLE CRITICAL VALUE NOTED.  VALUE IS CONSISTENT WITH PREVIOUSLY REPORTED AND CALLED VALUE.    Culture (A)  Final    STAPHYLOCOCCUS AUREUS SUSCEPTIBILITIES PERFORMED ON PREVIOUS CULTURE WITHIN THE LAST 5 DAYS.    Report Status 03/05/2016 FINAL  Final  MRSA PCR Screening     Status: Abnormal   Collection Time: 03/02/16  8:34 AM  Result Value Ref  Range Status   MRSA by PCR POSITIVE (A) NEGATIVE Final    Comment:        The GeneXpert MRSA Assay (FDA approved for NASAL specimens only), is one component of a comprehensive MRSA colonization surveillance program. It is not intended to diagnose MRSA infection nor to guide or monitor treatment for MRSA infections. RESULT CALLED TO, READ BACK BY AND VERIFIED WITH: RWendie Chess RN 11:35 03/02/16 ([email or web address])   Aerobic Culture (superficial specimen)     Status: None   Collection Time: 03/02/16  2:10 PM  Result Value Ref Range Status   Specimen Description WOUND  Final   Special Requests AVF  Final   Gram Stain NO WBC SEEN NO ORGANISMS SEEN   Final   Culture   Final    MODERATE METHICILLIN RESISTANT STAPHYLOCOCCUS AUREUS   Report Status 03/05/2016 FINAL  Final   Organism ID, Bacteria METHICILLIN RESISTANT STAPHYLOCOCCUS AUREUS  Final      Susceptibility   Methicillin resistant staphylococcus aureus - MIC*    CIPROFLOXACIN >=8 RESISTANT Resistant     ERYTHROMYCIN >=8 RESISTANT Resistant     GENTAMICIN <=0.5 SENSITIVE Sensitive     OXACILLIN >=4 RESISTANT Resistant     TETRACYCLINE <=1 SENSITIVE Sensitive     VANCOMYCIN <=0.5 SENSITIVE Sensitive     TRIMETH/SULFA <=10 SENSITIVE Sensitive     CLINDAMYCIN <=0.25 SENSITIVE Sensitive     RIFAMPIN <=0.5 SENSITIVE Sensitive  Inducible Clindamycin NEGATIVE Sensitive     * MODERATE METHICILLIN RESISTANT STAPHYLOCOCCUS AUREUS  Culture, blood (Routine X 2) w Reflex to ID Panel     Status: Abnormal   Collection Time: 03/03/16  4:54 AM  Result Value Ref Range Status   Specimen Description BLOOD RIGHT HAND  Final   Special Requests IN PEDIATRIC BOTTLE 1CC  Final   Culture  Setup Time   Final    GRAM POSITIVE COCCI IN CLUSTERS AEROBIC BOTTLE ONLY CRITICAL VALUE NOTED.  VALUE IS CONSISTENT WITH PREVIOUSLY REPORTED AND CALLED VALUE.    Culture (A)  Final    STAPHYLOCOCCUS AUREUS SUSCEPTIBILITIES PERFORMED ON PREVIOUS CULTURE WITHIN  THE LAST 5 DAYS.    Report Status 03/05/2016 FINAL  Final  Culture, blood (routine x 2)     Status: None (Preliminary result)   Collection Time: 03/04/16  9:15 AM  Result Value Ref Range Status   Specimen Description BLOOD LEFT HAND  Final   Special Requests BOTTLES DRAWN AEROBIC ONLY  10CC  Final   Culture NO GROWTH 1 DAY  Final   Report Status PENDING  Incomplete  Culture, blood (routine x 2)     Status: None (Preliminary result)   Collection Time: 03/04/16  9:15 AM  Result Value Ref Range Status   Specimen Description BLOOD LEFT ARM  Final   Special Requests BOTTLES DRAWN AEROBIC ONLY  5CC  Final   Culture NO GROWTH 1 DAY  Final   Report Status PENDING  Incomplete    FURTHER DISCHARGE INSTRUCTIONS:  Get Medicines reviewed and adjusted: Please take all your medications with you for your next visit with your Primary MD  Laboratory/radiological data: Please request your Primary MD to go over all hospital tests and procedure/radiological results at the follow up, please ask your Primary MD to get all Hospital records sent to his/her office.  In some cases, they will be blood work, cultures and biopsy results pending at the time of your discharge. Please request that your primary care M.D. goes through all the records of your hospital data and follows up on these results.  Also Note the following: If you experience worsening of your admission symptoms, develop shortness of breath, life threatening emergency, suicidal or homicidal thoughts you must seek medical attention immediately by calling 911 or calling your MD immediately  if symptoms less severe.  You must read complete instructions/literature along with all the possible adverse reactions/side effects for all the Medicines you take and that have been prescribed to you. Take any new Medicines after you have completely understood and accpet all the possible adverse reactions/side effects.   Do not drive when taking Pain  medications or sleeping medications (Benzodaizepines)  Do not take more than prescribed Pain, Sleep and Anxiety Medications. It is not advisable to combine anxiety,sleep and pain medications without talking with your primary care practitioner  Special Instructions: If you have smoked or chewed Tobacco  in the last 2 yrs please stop smoking, stop any regular Alcohol  and or any Recreational drug use.  Wear Seat belts while driving.  Please note: You were cared for by a hospitalist during your hospital stay. Once you are discharged, your primary care physician will handle any further medical issues. Please note that NO REFILLS for any discharge medications will be authorized once you are discharged, as it is imperative that you return to your primary care physician (or establish a relationship with a primary care physician if you do not have one)  for your post hospital discharge needs so that they can reassess your need for medications and monitor your lab values.  Total Time spent coordinating discharge including counseling, education and face to face time equals 45 minutes.  SignedOren Binet 03/06/2016 11:00 AM

## 2016-03-06 NOTE — Progress Notes (Signed)
  Echocardiogram Echocardiogram Transesophageal has been performed.  Rachael George 03/06/2016, 8:54 AM

## 2016-03-09 ENCOUNTER — Encounter (HOSPITAL_COMMUNITY): Payer: Self-pay | Admitting: Cardiology

## 2016-03-09 DIAGNOSIS — E1129 Type 2 diabetes mellitus with other diabetic kidney complication: Secondary | ICD-10-CM | POA: Diagnosis not present

## 2016-03-09 DIAGNOSIS — T8249XA Other complication of vascular dialysis catheter, initial encounter: Secondary | ICD-10-CM | POA: Diagnosis not present

## 2016-03-09 DIAGNOSIS — N2581 Secondary hyperparathyroidism of renal origin: Secondary | ICD-10-CM | POA: Diagnosis not present

## 2016-03-09 DIAGNOSIS — N186 End stage renal disease: Secondary | ICD-10-CM | POA: Diagnosis not present

## 2016-03-09 DIAGNOSIS — A4902 Methicillin resistant Staphylococcus aureus infection, unspecified site: Secondary | ICD-10-CM | POA: Diagnosis not present

## 2016-03-09 DIAGNOSIS — D509 Iron deficiency anemia, unspecified: Secondary | ICD-10-CM | POA: Diagnosis not present

## 2016-03-09 LAB — CULTURE, BLOOD (ROUTINE X 2)
Culture: NO GROWTH
Culture: NO GROWTH

## 2016-03-11 DIAGNOSIS — E1129 Type 2 diabetes mellitus with other diabetic kidney complication: Secondary | ICD-10-CM | POA: Diagnosis not present

## 2016-03-11 DIAGNOSIS — N2581 Secondary hyperparathyroidism of renal origin: Secondary | ICD-10-CM | POA: Diagnosis not present

## 2016-03-11 DIAGNOSIS — A4902 Methicillin resistant Staphylococcus aureus infection, unspecified site: Secondary | ICD-10-CM | POA: Diagnosis not present

## 2016-03-11 DIAGNOSIS — D509 Iron deficiency anemia, unspecified: Secondary | ICD-10-CM | POA: Diagnosis not present

## 2016-03-11 DIAGNOSIS — N186 End stage renal disease: Secondary | ICD-10-CM | POA: Diagnosis not present

## 2016-03-11 DIAGNOSIS — T8249XA Other complication of vascular dialysis catheter, initial encounter: Secondary | ICD-10-CM | POA: Diagnosis not present

## 2016-03-13 DIAGNOSIS — N186 End stage renal disease: Secondary | ICD-10-CM | POA: Diagnosis not present

## 2016-03-13 DIAGNOSIS — E1129 Type 2 diabetes mellitus with other diabetic kidney complication: Secondary | ICD-10-CM | POA: Diagnosis not present

## 2016-03-13 DIAGNOSIS — D509 Iron deficiency anemia, unspecified: Secondary | ICD-10-CM | POA: Diagnosis not present

## 2016-03-13 DIAGNOSIS — N2581 Secondary hyperparathyroidism of renal origin: Secondary | ICD-10-CM | POA: Diagnosis not present

## 2016-03-13 DIAGNOSIS — T8249XA Other complication of vascular dialysis catheter, initial encounter: Secondary | ICD-10-CM | POA: Diagnosis not present

## 2016-03-13 DIAGNOSIS — A4902 Methicillin resistant Staphylococcus aureus infection, unspecified site: Secondary | ICD-10-CM | POA: Diagnosis not present

## 2016-03-15 DIAGNOSIS — N186 End stage renal disease: Secondary | ICD-10-CM | POA: Diagnosis not present

## 2016-03-15 DIAGNOSIS — D509 Iron deficiency anemia, unspecified: Secondary | ICD-10-CM | POA: Diagnosis not present

## 2016-03-15 DIAGNOSIS — N2581 Secondary hyperparathyroidism of renal origin: Secondary | ICD-10-CM | POA: Diagnosis not present

## 2016-03-15 DIAGNOSIS — E1129 Type 2 diabetes mellitus with other diabetic kidney complication: Secondary | ICD-10-CM | POA: Diagnosis not present

## 2016-03-15 DIAGNOSIS — A4902 Methicillin resistant Staphylococcus aureus infection, unspecified site: Secondary | ICD-10-CM | POA: Diagnosis not present

## 2016-03-15 DIAGNOSIS — T8249XA Other complication of vascular dialysis catheter, initial encounter: Secondary | ICD-10-CM | POA: Diagnosis not present

## 2016-03-18 ENCOUNTER — Telehealth: Payer: Self-pay | Admitting: Surgery

## 2016-03-18 DIAGNOSIS — N186 End stage renal disease: Secondary | ICD-10-CM | POA: Diagnosis not present

## 2016-03-18 DIAGNOSIS — N2581 Secondary hyperparathyroidism of renal origin: Secondary | ICD-10-CM | POA: Diagnosis not present

## 2016-03-18 DIAGNOSIS — E1129 Type 2 diabetes mellitus with other diabetic kidney complication: Secondary | ICD-10-CM | POA: Diagnosis not present

## 2016-03-18 DIAGNOSIS — A4902 Methicillin resistant Staphylococcus aureus infection, unspecified site: Secondary | ICD-10-CM | POA: Diagnosis not present

## 2016-03-18 DIAGNOSIS — T8249XA Other complication of vascular dialysis catheter, initial encounter: Secondary | ICD-10-CM | POA: Diagnosis not present

## 2016-03-18 DIAGNOSIS — D509 Iron deficiency anemia, unspecified: Secondary | ICD-10-CM | POA: Diagnosis not present

## 2016-03-18 NOTE — Telephone Encounter (Signed)
Spoke to pt about moving up appt. Pt was in the car and said she would have to call back. Melida got the call and she moved it up to 04/06/16 at 1:45. Pt called back and we moved her appt up to 11:15.

## 2016-03-18 NOTE — Telephone Encounter (Signed)
-----   Message from [name], RN sent at 03/18/2016  1:41 PM EST ----- Regarding: request for earlier appt. date Per Tobe Sos, PA, Dr. [name] would like this pt. to be evaluated sooner than 1/29, for eval for new permanent access.  Can we move her appt. with Dr. Trula Slade to 04/06/16, since he opened up the office on that day?  Please call the [facility] with new appt. @ [phone].  Thanks.

## 2016-03-20 DIAGNOSIS — D509 Iron deficiency anemia, unspecified: Secondary | ICD-10-CM | POA: Diagnosis not present

## 2016-03-20 DIAGNOSIS — T8249XA Other complication of vascular dialysis catheter, initial encounter: Secondary | ICD-10-CM | POA: Diagnosis not present

## 2016-03-20 DIAGNOSIS — A4902 Methicillin resistant Staphylococcus aureus infection, unspecified site: Secondary | ICD-10-CM | POA: Diagnosis not present

## 2016-03-20 DIAGNOSIS — N186 End stage renal disease: Secondary | ICD-10-CM | POA: Diagnosis not present

## 2016-03-20 DIAGNOSIS — E1129 Type 2 diabetes mellitus with other diabetic kidney complication: Secondary | ICD-10-CM | POA: Diagnosis not present

## 2016-03-20 DIAGNOSIS — N2581 Secondary hyperparathyroidism of renal origin: Secondary | ICD-10-CM | POA: Diagnosis not present

## 2016-03-22 DIAGNOSIS — Z992 Dependence on renal dialysis: Secondary | ICD-10-CM | POA: Diagnosis not present

## 2016-03-22 DIAGNOSIS — E1129 Type 2 diabetes mellitus with other diabetic kidney complication: Secondary | ICD-10-CM | POA: Diagnosis not present

## 2016-03-22 DIAGNOSIS — I129 Hypertensive chronic kidney disease with stage 1 through stage 4 chronic kidney disease, or unspecified chronic kidney disease: Secondary | ICD-10-CM | POA: Diagnosis not present

## 2016-03-22 DIAGNOSIS — N2581 Secondary hyperparathyroidism of renal origin: Secondary | ICD-10-CM | POA: Diagnosis not present

## 2016-03-22 DIAGNOSIS — T8249XA Other complication of vascular dialysis catheter, initial encounter: Secondary | ICD-10-CM | POA: Diagnosis not present

## 2016-03-22 DIAGNOSIS — D509 Iron deficiency anemia, unspecified: Secondary | ICD-10-CM | POA: Diagnosis not present

## 2016-03-22 DIAGNOSIS — N186 End stage renal disease: Secondary | ICD-10-CM | POA: Diagnosis not present

## 2016-03-22 DIAGNOSIS — A4902 Methicillin resistant Staphylococcus aureus infection, unspecified site: Secondary | ICD-10-CM | POA: Diagnosis not present

## 2016-03-25 DIAGNOSIS — D509 Iron deficiency anemia, unspecified: Secondary | ICD-10-CM | POA: Diagnosis not present

## 2016-03-25 DIAGNOSIS — D696 Thrombocytopenia, unspecified: Secondary | ICD-10-CM | POA: Diagnosis not present

## 2016-03-25 DIAGNOSIS — D631 Anemia in chronic kidney disease: Secondary | ICD-10-CM | POA: Diagnosis not present

## 2016-03-25 DIAGNOSIS — E1129 Type 2 diabetes mellitus with other diabetic kidney complication: Secondary | ICD-10-CM | POA: Diagnosis not present

## 2016-03-25 DIAGNOSIS — T8249XA Other complication of vascular dialysis catheter, initial encounter: Secondary | ICD-10-CM | POA: Diagnosis not present

## 2016-03-25 DIAGNOSIS — N2581 Secondary hyperparathyroidism of renal origin: Secondary | ICD-10-CM | POA: Diagnosis not present

## 2016-03-25 DIAGNOSIS — A4902 Methicillin resistant Staphylococcus aureus infection, unspecified site: Secondary | ICD-10-CM | POA: Diagnosis not present

## 2016-03-25 DIAGNOSIS — N186 End stage renal disease: Secondary | ICD-10-CM | POA: Diagnosis not present

## 2016-03-26 ENCOUNTER — Encounter: Payer: Self-pay | Admitting: Family Medicine

## 2016-03-26 ENCOUNTER — Ambulatory Visit: Payer: Medicare Other | Attending: Family Medicine | Admitting: Family Medicine

## 2016-03-26 VITALS — BP 135/95 | HR 95 | Temp 98.2°F | Ht 62.0 in | Wt 178.2 lb

## 2016-03-26 DIAGNOSIS — N186 End stage renal disease: Secondary | ICD-10-CM | POA: Diagnosis not present

## 2016-03-26 DIAGNOSIS — R7303 Prediabetes: Secondary | ICD-10-CM

## 2016-03-26 DIAGNOSIS — M25512 Pain in left shoulder: Secondary | ICD-10-CM | POA: Insufficient documentation

## 2016-03-26 DIAGNOSIS — M069 Rheumatoid arthritis, unspecified: Secondary | ICD-10-CM | POA: Insufficient documentation

## 2016-03-26 DIAGNOSIS — E7800 Pure hypercholesterolemia, unspecified: Secondary | ICD-10-CM | POA: Diagnosis not present

## 2016-03-26 DIAGNOSIS — Z888 Allergy status to other drugs, medicaments and biological substances status: Secondary | ICD-10-CM | POA: Diagnosis not present

## 2016-03-26 DIAGNOSIS — G4730 Sleep apnea, unspecified: Secondary | ICD-10-CM | POA: Insufficient documentation

## 2016-03-26 DIAGNOSIS — D649 Anemia, unspecified: Secondary | ICD-10-CM | POA: Insufficient documentation

## 2016-03-26 DIAGNOSIS — M1A09X0 Idiopathic chronic gout, multiple sites, without tophus (tophi): Secondary | ICD-10-CM | POA: Diagnosis not present

## 2016-03-26 DIAGNOSIS — I120 Hypertensive chronic kidney disease with stage 5 chronic kidney disease or end stage renal disease: Secondary | ICD-10-CM | POA: Insufficient documentation

## 2016-03-26 DIAGNOSIS — Z87442 Personal history of urinary calculi: Secondary | ICD-10-CM | POA: Insufficient documentation

## 2016-03-26 DIAGNOSIS — J4520 Mild intermittent asthma, uncomplicated: Secondary | ICD-10-CM

## 2016-03-26 DIAGNOSIS — Z90710 Acquired absence of both cervix and uterus: Secondary | ICD-10-CM | POA: Insufficient documentation

## 2016-03-26 DIAGNOSIS — Z7682 Awaiting organ transplant status: Secondary | ICD-10-CM | POA: Insufficient documentation

## 2016-03-26 DIAGNOSIS — E039 Hypothyroidism, unspecified: Secondary | ICD-10-CM | POA: Insufficient documentation

## 2016-03-26 DIAGNOSIS — E1169 Type 2 diabetes mellitus with other specified complication: Secondary | ICD-10-CM | POA: Diagnosis not present

## 2016-03-26 DIAGNOSIS — K219 Gastro-esophageal reflux disease without esophagitis: Secondary | ICD-10-CM | POA: Insufficient documentation

## 2016-03-26 DIAGNOSIS — Z880 Allergy status to penicillin: Secondary | ICD-10-CM | POA: Insufficient documentation

## 2016-03-26 DIAGNOSIS — Z9889 Other specified postprocedural states: Secondary | ICD-10-CM | POA: Insufficient documentation

## 2016-03-26 DIAGNOSIS — Z886 Allergy status to analgesic agent status: Secondary | ICD-10-CM | POA: Diagnosis not present

## 2016-03-26 DIAGNOSIS — E1122 Type 2 diabetes mellitus with diabetic chronic kidney disease: Secondary | ICD-10-CM | POA: Insufficient documentation

## 2016-03-26 LAB — GLUCOSE, POCT (MANUAL RESULT ENTRY): POC Glucose: 109 mg/dl — AB (ref 70–99)

## 2016-03-26 LAB — POCT GLYCOSYLATED HEMOGLOBIN (HGB A1C): Hemoglobin A1C: 5.2

## 2016-03-26 MED ORDER — ACETAMINOPHEN-CODEINE 300-30 MG PO TABS: 1.0000 | ORAL_TABLET | Freq: Two times a day (BID) | ORAL | 0 refills | Status: DC | PRN

## 2016-03-27 DIAGNOSIS — D509 Iron deficiency anemia, unspecified: Secondary | ICD-10-CM | POA: Diagnosis not present

## 2016-03-27 DIAGNOSIS — A4902 Methicillin resistant Staphylococcus aureus infection, unspecified site: Secondary | ICD-10-CM | POA: Diagnosis not present

## 2016-03-27 DIAGNOSIS — T8249XA Other complication of vascular dialysis catheter, initial encounter: Secondary | ICD-10-CM | POA: Diagnosis not present

## 2016-03-27 DIAGNOSIS — N2581 Secondary hyperparathyroidism of renal origin: Secondary | ICD-10-CM | POA: Diagnosis not present

## 2016-03-27 DIAGNOSIS — N186 End stage renal disease: Secondary | ICD-10-CM | POA: Diagnosis not present

## 2016-03-27 DIAGNOSIS — E1129 Type 2 diabetes mellitus with other diabetic kidney complication: Secondary | ICD-10-CM | POA: Diagnosis not present

## 2016-03-27 NOTE — Progress Notes (Signed)
Subjective:  Patient ID: Rachael George, female    DOB: 05-16-1955  Age: 60 y.o. MRN: [id number]  CC: Hospitalization Follow-up; Hypertension; Diabetes; Hyperlipidemia; Hypothyroidism; Gout; Eczema; Asthma; and Shoulder Pain (left side where catheter was placed)   HPI Rachael George is a 61 year old female with a history of hypertension, hyperlipidemia, end-stage renal disease (on hemodialysis Mondays, Wednesdays and Fridays), gout, asthma prediabetes presents to the clinic for follow-up visit.  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 She now undergoes dialysis through a left subclavian permacath and complains of pain at the site. She has had permacath in for the last 3 weeks and complains of pain in her left shoulder every time she moves. Denies fever. She is currently on the transplant list she says.  Compliant with her Proventil for asthma and denies any exacerbation flares. She has also not had any recent gout flares. Currently does not take any medications for hypertension and this is diet controlled  Past Medical History:  Diagnosis Date  . Anemia   . Asthma   . GERD (gastroesophageal reflux disease)   . Gout   . Heavy menstrual bleeding   . High cholesterol   . History of blood transfusion   . Hyperlipemia 12/07/2012  . Hypertension   . Kidney disease   . Kidney stones   . Prediabetes   . RA (rheumatoid arthritis) ([place])   . Sleep apnea     Past Surgical History:  Procedure Laterality Date  . ABDOMINAL HYSTERECTOMY  2000  . BASCILIC VEIN TRANSPOSITION Right 12/06/2014   Procedure: FIRST STAGE BASILIC VEIN TRANSPOSITION - RIGHT;  Surgeon: Serafina Mitchell, MD;  Location: [place];  Service: Vascular;  Laterality: Right;  . BASCILIC VEIN TRANSPOSITION Right 02/07/2015   Procedure: RIGHT ARM SECOND STAGE BASILIC VEIN TRANSPOSITION;  Surgeon: Serafina Mitchell, MD;   Location: [place];  Service: Vascular;  Laterality: Right;  . ECTOPIC PREGNANCY SURGERY  02/1982  . I&D EXTREMITY Right 02/07/2015   Procedure: IRRIGATION AND DEBRIDEMENT RIGHT ARM HEMATOMA;  Surgeon: Serafina Mitchell, MD;  Location: [place];  Service: Vascular;  Laterality: Right;  . INSERTION OF DIALYSIS CATHETER  03/03/2016   Procedure: INSERTION OF DIALYSIS CATHETER;  Surgeon: Elam Dutch, MD;  Location: [place];  Service: Vascular;;  . left shoulder surgery  08/2005  . LIGATION OF ARTERIOVENOUS  FISTULA  03/03/2016   Procedure: LIGATION OF ARTERIOVENOUS  FISTULA;  Surgeon: Elam Dutch, MD;  Location: [place];  Service: Vascular;;  . TEE WITHOUT CARDIOVERSION N/A 03/06/2016   Procedure: TRANSESOPHAGEAL ECHOCARDIOGRAM (TEE);  Surgeon: Sueanne Margarita, MD;  Location: [place];  Service: Cardiovascular;  Laterality: N/A;  . TUBAL LIGATION  11/1986    Allergies  Allergen Reactions  . Atacand [Candesartan] Anaphylaxis  . Lisinopril Anaphylaxis  . Motrin [Ibuprofen] Anaphylaxis  . Nsaids Anaphylaxis  . Tolmetin Anaphylaxis  . Penicillins Rash    Has patient had a PCN reaction causing immediate rash, facial/tongue/throat swelling, SOB or lightheadedness with hypotension: No Has patient had a PCN reaction causing severe rash involving mucus membranes or skin necrosis: No Has patient had a PCN reaction that required hospitalization No Has patient had a PCN reaction occurring within the last 10 years: No If all of the above answers are "NO", then may proceed with Cephalosporin use.      Outpatient Medications Prior to Visit  Medication Sig Dispense Refill  . albuterol (PROAIR HFA) 108 (90 Base) MCG/ACT inhaler Inhale 2 puffs into the lungs every 6 (six) hours as needed for wheezing. 1 Inhaler 4  . allopurinol (ZYLOPRIM) 100 MG tablet Take 100 mg by mouth 2 (two) times daily. Reported on 09/02/2015    . B Complex-C-Folic Acid (DIALYVITE TABLET) TABS Take 1 tablet by mouth daily.    .  calcitRIOL (ROCALTROL) 0.5 MCG capsule Take 2 mcg by mouth every Monday, Wednesday, and Friday with hemodialysis. With dialysis    . cinacalcet (SENSIPAR) 30 MG tablet Take 30 mg by mouth daily.    . colchicine 0.6 MG tablet Take 2 tabs (1.61m) at the onset of a gout flare, may repeat 1 tab (0.618m if symptoms persist 30 tablet 2  . famotidine (PEPCID) 20 MG tablet Take 20 mg by mouth 2 (two) times daily.    . pantoprazole (PROTONIX) 40 MG tablet Take 40 mg by mouth 2 (two) times daily.    . sevelamer (RENAGEL) 800 MG tablet Take 1,600-3,200 mg by mouth See admin instructions. 3,200 mg three times a day with meals and 1,600 mg twice a day with snacks    . vancomycin IVPB Inject 1,000 mg into the vein every Monday, Wednesday, and Friday with hemodialysis. Indication:  Infected HD access Last Day of Therapy:  03/24/16 Labs - Sunday/Monday:  CBC/D, BMP, and vancomycin trough. Labs - Thursday:  BMP and vancomycin trough Labs - Every other week:  ESR and CRP 7 Units 0  . atorvastatin (LIPITOR) 80 MG tablet Take 1 tablet (80 mg total) by mouth daily. (Patient not taking: Reported on 03/26/2016) 90 tablet 1   No facility-administered medications prior to visit.     ROS Review of Systems  Constitutional: Negative for activity change, appetite change and fatigue.  HENT: Negative for congestion, sinus pressure and sore throat.   Eyes: Negative for visual disturbance.  Respiratory: Negative for cough, chest tightness, shortness of breath and wheezing.   Cardiovascular: Negative for chest pain and palpitations.  Gastrointestinal: Negative for abdominal distention, abdominal pain and constipation.  Endocrine: Negative for polydipsia.  Genitourinary: Negative for dysuria and frequency.  Musculoskeletal: Negative for arthralgias and back pain.       Pain at site of PermCath  Skin: Negative for rash.  Neurological: Negative for tremors, light-headedness and numbness.  Hematological: Does not bruise/bleed  easily.  Psychiatric/Behavioral: Negative for agitation and behavioral problems.    Objective:  BP (!) 135/95 (BP Location: Right Arm, Patient Position: Sitting, Cuff Size: Large)   Pulse 95   Temp 98.2 F (36.8 C) (Oral)   Ht 5' 2"  (1.575 m)   Wt 178 lb 3.2 oz (80.8 kg)   SpO2 100%   BMI 32.59 kg/m   BP/Weight 03/26/2016 03/06/2016 08/23/33/4562Systolic BP [id number] BP 95 86 87  Wt. (Lbs) 178.2 178 177.6  BMI 32.59 32.56 32.48      Physical Exam  Constitutional: She is oriented to person, place, and time. She appears well-developed and well-nourished.  Cardiovascular: Normal rate, normal heart sounds and intact distal pulses.   No murmur heard. Pulmonary/Chest: Effort normal and breath sounds normal. She has no wheezes. She has no rales. She exhibits no tenderness.  Left upper chest wall permacath  Abdominal: Soft. Bowel sounds are normal. She exhibits no distension and no mass. There is no tenderness.  Neurological: She is alert and oriented to person, place, and time.    Lab  Results  Component Value Date   HGBA1C 5.2 03/26/2016    Assessment & Plan:   1. Prediabetes Controlled with A1c of 5.2 Lifestyle to modifications - Glucose (CBG) - HgB A1c  2. Mild intermittent asthma without complication No acute flares Using pro-air sparingly  3. Idiopathic chronic gout of multiple sites without tophus No acute flare Uses colchicine occasionally  4. End stage renal disease (HCC) Currently on hemodialysis And also on transplant list In the process of vein mapping for possible left arm AV fistula Tylenol #3 given for pain at site of permacath and patient to inform nephrology team at her next dialysis session.  Meds ordered this encounter  Medications  . Acetaminophen-Codeine (TYLENOL/CODEINE #3) 300-30 MG tablet    Sig: Take 1 tablet by mouth every 12 (twelve) hours as needed for pain.    Dispense:  50 tablet    Refill:  0    Follow-up: Return in  about 1 month (around 04/26/2016) for Complete physical exam.   Arnoldo Morale MD

## 2016-03-30 ENCOUNTER — Other Ambulatory Visit: Payer: Self-pay | Admitting: Pharmacist

## 2016-03-30 DIAGNOSIS — D509 Iron deficiency anemia, unspecified: Secondary | ICD-10-CM | POA: Diagnosis not present

## 2016-03-30 DIAGNOSIS — A4902 Methicillin resistant Staphylococcus aureus infection, unspecified site: Secondary | ICD-10-CM | POA: Diagnosis not present

## 2016-03-30 DIAGNOSIS — N2581 Secondary hyperparathyroidism of renal origin: Secondary | ICD-10-CM | POA: Diagnosis not present

## 2016-03-30 DIAGNOSIS — E1129 Type 2 diabetes mellitus with other diabetic kidney complication: Secondary | ICD-10-CM | POA: Diagnosis not present

## 2016-03-30 DIAGNOSIS — N186 End stage renal disease: Secondary | ICD-10-CM | POA: Diagnosis not present

## 2016-03-30 DIAGNOSIS — T8249XA Other complication of vascular dialysis catheter, initial encounter: Secondary | ICD-10-CM | POA: Diagnosis not present

## 2016-03-30 MED ORDER — ALBUTEROL SULFATE HFA 108 (90 BASE) MCG/ACT IN AERS: 2.0000 | INHALATION_SPRAY | Freq: Four times a day (QID) | RESPIRATORY_TRACT | 2 refills | Status: DC | PRN

## 2016-04-01 DIAGNOSIS — D509 Iron deficiency anemia, unspecified: Secondary | ICD-10-CM | POA: Diagnosis not present

## 2016-04-01 DIAGNOSIS — T8249XA Other complication of vascular dialysis catheter, initial encounter: Secondary | ICD-10-CM | POA: Diagnosis not present

## 2016-04-01 DIAGNOSIS — A4902 Methicillin resistant Staphylococcus aureus infection, unspecified site: Secondary | ICD-10-CM | POA: Diagnosis not present

## 2016-04-01 DIAGNOSIS — E1129 Type 2 diabetes mellitus with other diabetic kidney complication: Secondary | ICD-10-CM | POA: Diagnosis not present

## 2016-04-01 DIAGNOSIS — N2581 Secondary hyperparathyroidism of renal origin: Secondary | ICD-10-CM | POA: Diagnosis not present

## 2016-04-01 DIAGNOSIS — N186 End stage renal disease: Secondary | ICD-10-CM | POA: Diagnosis not present

## 2016-04-02 ENCOUNTER — Encounter: Payer: Self-pay | Admitting: Surgery

## 2016-04-03 DIAGNOSIS — E1129 Type 2 diabetes mellitus with other diabetic kidney complication: Secondary | ICD-10-CM | POA: Diagnosis not present

## 2016-04-03 DIAGNOSIS — N186 End stage renal disease: Secondary | ICD-10-CM | POA: Diagnosis not present

## 2016-04-03 DIAGNOSIS — T8249XA Other complication of vascular dialysis catheter, initial encounter: Secondary | ICD-10-CM | POA: Diagnosis not present

## 2016-04-03 DIAGNOSIS — A4902 Methicillin resistant Staphylococcus aureus infection, unspecified site: Secondary | ICD-10-CM | POA: Diagnosis not present

## 2016-04-03 DIAGNOSIS — D509 Iron deficiency anemia, unspecified: Secondary | ICD-10-CM | POA: Diagnosis not present

## 2016-04-03 DIAGNOSIS — N2581 Secondary hyperparathyroidism of renal origin: Secondary | ICD-10-CM | POA: Diagnosis not present

## 2016-04-06 ENCOUNTER — Encounter: Payer: Self-pay | Admitting: Surgery

## 2016-04-06 ENCOUNTER — Ambulatory Visit: Payer: Medicare Other | Admitting: Surgery

## 2016-04-06 ENCOUNTER — Other Ambulatory Visit: Payer: Self-pay

## 2016-04-06 ENCOUNTER — Ambulatory Visit (INDEPENDENT_AMBULATORY_CARE_PROVIDER_SITE_OTHER): Payer: Medicare Other | Admitting: Surgery

## 2016-04-06 VITALS — BP 158/109 | HR 90 | Temp 97.7°F | Resp 20 | Ht 62.0 in | Wt 180.5 lb

## 2016-04-06 DIAGNOSIS — N186 End stage renal disease: Secondary | ICD-10-CM

## 2016-04-06 DIAGNOSIS — D509 Iron deficiency anemia, unspecified: Secondary | ICD-10-CM | POA: Diagnosis not present

## 2016-04-06 DIAGNOSIS — E1129 Type 2 diabetes mellitus with other diabetic kidney complication: Secondary | ICD-10-CM | POA: Diagnosis not present

## 2016-04-06 DIAGNOSIS — A4902 Methicillin resistant Staphylococcus aureus infection, unspecified site: Secondary | ICD-10-CM | POA: Diagnosis not present

## 2016-04-06 DIAGNOSIS — T8249XA Other complication of vascular dialysis catheter, initial encounter: Secondary | ICD-10-CM | POA: Diagnosis not present

## 2016-04-06 DIAGNOSIS — N2581 Secondary hyperparathyroidism of renal origin: Secondary | ICD-10-CM | POA: Diagnosis not present

## 2016-04-06 DIAGNOSIS — Z992 Dependence on renal dialysis: Secondary | ICD-10-CM

## 2016-04-06 NOTE — Progress Notes (Signed)
Patient name: Rachael George MRN: [id number] DOB: 08-Jul-1955 Sex: female  REASON FOR VISIT: Dialysis access  HPI: Rachael George is a 61 y.o. female who is left-handed.  She previously underwent a basilic vein transposition on the right.  This was working fine however she developed an infection in the right arm which ultimately lead to fistula rupture requiring ligation and catheter placement.  During her hospital stay she had vein mapping done of her left arm which showed an adequate basilic vein.  She is here today for further discussions.  She has healed her incisions on the right arm.  She is on dialysis Tuesday Thursday Saturday  Current Outpatient Prescriptions  Medication Sig Dispense Refill  . Acetaminophen-Codeine (TYLENOL/CODEINE #3) 300-30 MG tablet Take 1 tablet by mouth every 12 (twelve) hours as needed for pain. 50 tablet 0  . albuterol (PROVENTIL HFA;VENTOLIN HFA) 108 (90 Base) MCG/ACT inhaler Inhale 2 puffs into the lungs every 6 (six) hours as needed for wheezing or shortness of breath. 1 Inhaler 2  . allopurinol (ZYLOPRIM) 100 MG tablet Take 100 mg by mouth 2 (two) times daily. Reported on 09/02/2015    . B Complex-C-Folic Acid (DIALYVITE TABLET) TABS Take 1 tablet by mouth daily.    . calcitRIOL (ROCALTROL) 0.5 MCG capsule Take 2 mcg by mouth every Monday, Wednesday, and Friday with hemodialysis. With dialysis    . cinacalcet (SENSIPAR) 30 MG tablet Take 30 mg by mouth daily.    . colchicine 0.6 MG tablet Take 2 tabs (1.88m) at the onset of a gout flare, may repeat 1 tab (0.645m if symptoms persist 30 tablet 2  . famotidine (PEPCID) 20 MG tablet Take 20 mg by mouth 2 (two) times daily.    . pantoprazole (PROTONIX) 40 MG tablet Take 40 mg by mouth 2 (two) times daily.    . sevelamer (RENAGEL) 800 MG tablet Take 1,600-3,200 mg by mouth See admin instructions. 3,200 mg three times a day with meals and 1,600 mg twice a day with snacks    .  atorvastatin (LIPITOR) 80 MG tablet Take 1 tablet (80 mg total) by mouth daily. (Patient not taking: Reported on 04/06/2016) 90 tablet 1  . vancomycin IVPB Inject 1,000 mg into the vein every Monday, Wednesday, and Friday with hemodialysis. Indication:  Infected HD access Last Day of Therapy:  03/24/16 Labs - Sunday/Monday:  CBC/D, BMP, and vancomycin trough. Labs - Thursday:  BMP and vancomycin trough Labs - Every other week:  ESR and CRP (Patient not taking: Reported on 04/06/2016) 7 Units 0   No current facility-administered medications for this visit.     REVIEW OF SYSTEMS:  [X]  denotes positive finding, [ ]  denotes negative finding Cardiac  Comments:  Chest pain or chest pressure:    Shortness of breath upon exertion:    Short of breath when lying flat:    Irregular heart rhythm:    Constitutional    Fever or chills:      PHYSICAL EXAM: Vitals:   04/06/16 1058  BP: (!) 158/109  Pulse: 90  Resp: 20  Temp: 97.7 F (36.5 C)  TempSrc: Oral  SpO2: 97%  Weight: 180 lb 8 oz (81.9 kg)  Height: 5' 2"  (1.575 m)    GENERAL: The patient is a well-nourished female, in no acute distress. The vital signs are documented above. CARDIOVASCULAR: There is a regular rate and rhythm. PULMONARY: Non-labored respirations Well-healed right arm incision.  Palpable left radial pulse  MEDICAL ISSUES: Her vein  mapping while she was in the hospital reveals an adequate basilic vein on the left.  We discussed proceeding with a staged left basilic vein procedure.  The first stage is been scheduled for Wednesday, January 31.  The risks and benefits of the operation were discussed with the patient.  All of her questions were answered.  Annamarie Major, MD [facility] [phone] Pager [phone]

## 2016-04-10 DIAGNOSIS — N186 End stage renal disease: Secondary | ICD-10-CM | POA: Diagnosis not present

## 2016-04-10 DIAGNOSIS — N2581 Secondary hyperparathyroidism of renal origin: Secondary | ICD-10-CM | POA: Diagnosis not present

## 2016-04-10 DIAGNOSIS — T8249XA Other complication of vascular dialysis catheter, initial encounter: Secondary | ICD-10-CM | POA: Diagnosis not present

## 2016-04-10 DIAGNOSIS — D509 Iron deficiency anemia, unspecified: Secondary | ICD-10-CM | POA: Diagnosis not present

## 2016-04-10 DIAGNOSIS — E1129 Type 2 diabetes mellitus with other diabetic kidney complication: Secondary | ICD-10-CM | POA: Diagnosis not present

## 2016-04-10 DIAGNOSIS — A4902 Methicillin resistant Staphylococcus aureus infection, unspecified site: Secondary | ICD-10-CM | POA: Diagnosis not present

## 2016-04-13 DIAGNOSIS — N186 End stage renal disease: Secondary | ICD-10-CM | POA: Diagnosis not present

## 2016-04-13 DIAGNOSIS — A4902 Methicillin resistant Staphylococcus aureus infection, unspecified site: Secondary | ICD-10-CM | POA: Diagnosis not present

## 2016-04-13 DIAGNOSIS — E1129 Type 2 diabetes mellitus with other diabetic kidney complication: Secondary | ICD-10-CM | POA: Diagnosis not present

## 2016-04-13 DIAGNOSIS — T8249XA Other complication of vascular dialysis catheter, initial encounter: Secondary | ICD-10-CM | POA: Diagnosis not present

## 2016-04-13 DIAGNOSIS — D509 Iron deficiency anemia, unspecified: Secondary | ICD-10-CM | POA: Diagnosis not present

## 2016-04-13 DIAGNOSIS — N2581 Secondary hyperparathyroidism of renal origin: Secondary | ICD-10-CM | POA: Diagnosis not present

## 2016-04-15 ENCOUNTER — Other Ambulatory Visit: Payer: Self-pay | Admitting: *Deleted

## 2016-04-17 DIAGNOSIS — A4902 Methicillin resistant Staphylococcus aureus infection, unspecified site: Secondary | ICD-10-CM | POA: Diagnosis not present

## 2016-04-17 DIAGNOSIS — D509 Iron deficiency anemia, unspecified: Secondary | ICD-10-CM | POA: Diagnosis not present

## 2016-04-17 DIAGNOSIS — N2581 Secondary hyperparathyroidism of renal origin: Secondary | ICD-10-CM | POA: Diagnosis not present

## 2016-04-17 DIAGNOSIS — N186 End stage renal disease: Secondary | ICD-10-CM | POA: Diagnosis not present

## 2016-04-17 DIAGNOSIS — T8249XA Other complication of vascular dialysis catheter, initial encounter: Secondary | ICD-10-CM | POA: Diagnosis not present

## 2016-04-17 DIAGNOSIS — E1129 Type 2 diabetes mellitus with other diabetic kidney complication: Secondary | ICD-10-CM | POA: Diagnosis not present

## 2016-04-20 ENCOUNTER — Ambulatory Visit: Payer: Medicare Other | Admitting: Surgery

## 2016-04-20 DIAGNOSIS — E1129 Type 2 diabetes mellitus with other diabetic kidney complication: Secondary | ICD-10-CM | POA: Diagnosis not present

## 2016-04-20 DIAGNOSIS — D509 Iron deficiency anemia, unspecified: Secondary | ICD-10-CM | POA: Diagnosis not present

## 2016-04-20 DIAGNOSIS — N2581 Secondary hyperparathyroidism of renal origin: Secondary | ICD-10-CM | POA: Diagnosis not present

## 2016-04-20 DIAGNOSIS — A4902 Methicillin resistant Staphylococcus aureus infection, unspecified site: Secondary | ICD-10-CM | POA: Diagnosis not present

## 2016-04-20 DIAGNOSIS — T8249XA Other complication of vascular dialysis catheter, initial encounter: Secondary | ICD-10-CM | POA: Diagnosis not present

## 2016-04-20 DIAGNOSIS — N186 End stage renal disease: Secondary | ICD-10-CM | POA: Diagnosis not present

## 2016-04-21 ENCOUNTER — Other Ambulatory Visit: Payer: Self-pay

## 2016-04-21 ENCOUNTER — Other Ambulatory Visit: Payer: Self-pay | Admitting: Internal Medicine

## 2016-04-21 ENCOUNTER — Other Ambulatory Visit: Payer: Self-pay | Admitting: Family Medicine

## 2016-04-21 ENCOUNTER — Encounter (HOSPITAL_COMMUNITY): Payer: Self-pay

## 2016-04-21 DIAGNOSIS — Z1231 Encounter for screening mammogram for malignant neoplasm of breast: Secondary | ICD-10-CM

## 2016-04-22 ENCOUNTER — Encounter (HOSPITAL_COMMUNITY)
Admission: RE | Disposition: A | Discharge status: Home / Self Care | Payer: Self-pay | Source: Ambulatory Visit | Attending: Surgery

## 2016-04-22 ENCOUNTER — Ambulatory Visit (HOSPITAL_COMMUNITY): Payer: Medicare Other

## 2016-04-22 ENCOUNTER — Ambulatory Visit (HOSPITAL_COMMUNITY)
Admission: RE | Admit: 2016-04-22 | Discharge: 2016-04-22 | Disposition: A | Discharge status: Home / Self Care | Payer: Medicare Other | Source: Ambulatory Visit | Attending: Surgery | Admitting: Surgery

## 2016-04-22 ENCOUNTER — Encounter (HOSPITAL_COMMUNITY): Payer: Self-pay | Admitting: *Deleted

## 2016-04-22 ENCOUNTER — Ambulatory Visit (HOSPITAL_COMMUNITY): Payer: Medicare Other | Admitting: Certified Registered Nurse Anesthetist

## 2016-04-22 DIAGNOSIS — I120 Hypertensive chronic kidney disease with stage 5 chronic kidney disease or end stage renal disease: Secondary | ICD-10-CM | POA: Diagnosis not present

## 2016-04-22 DIAGNOSIS — G4730 Sleep apnea, unspecified: Secondary | ICD-10-CM | POA: Diagnosis not present

## 2016-04-22 DIAGNOSIS — D631 Anemia in chronic kidney disease: Secondary | ICD-10-CM | POA: Diagnosis not present

## 2016-04-22 DIAGNOSIS — Z992 Dependence on renal dialysis: Secondary | ICD-10-CM | POA: Insufficient documentation

## 2016-04-22 DIAGNOSIS — Z79899 Other long term (current) drug therapy: Secondary | ICD-10-CM | POA: Diagnosis not present

## 2016-04-22 DIAGNOSIS — Z95828 Presence of other vascular implants and grafts: Secondary | ICD-10-CM

## 2016-04-22 DIAGNOSIS — N186 End stage renal disease: Secondary | ICD-10-CM | POA: Insufficient documentation

## 2016-04-22 DIAGNOSIS — T827XXA Infection and inflammatory reaction due to other cardiac and vascular devices, implants and grafts, initial encounter: Secondary | ICD-10-CM | POA: Diagnosis not present

## 2016-04-22 DIAGNOSIS — Z452 Encounter for adjustment and management of vascular access device: Secondary | ICD-10-CM | POA: Diagnosis not present

## 2016-04-22 DIAGNOSIS — I129 Hypertensive chronic kidney disease with stage 1 through stage 4 chronic kidney disease, or unspecified chronic kidney disease: Secondary | ICD-10-CM | POA: Diagnosis not present

## 2016-04-22 HISTORY — DX: Prediabetes: R73.03

## 2016-04-22 HISTORY — DX: Hypothyroidism, unspecified: E03.9

## 2016-04-22 HISTORY — PX: BASCILIC VEIN TRANSPOSITION: SHX5742

## 2016-04-22 HISTORY — PX: EXCHANGE OF A DIALYSIS CATHETER: SHX5818

## 2016-04-22 HISTORY — DX: Personal history of urinary calculi: Z87.442

## 2016-04-22 HISTORY — DX: Cardiac murmur, unspecified: R01.1

## 2016-04-22 LAB — POCT I-STAT 4, (NA,K, GLUC, HGB,HCT)
Glucose, Bld: 84 mg/dL (ref 65–99)
HCT: 30 % — ABNORMAL LOW (ref 36.0–46.0)
Hemoglobin: 10.2 g/dL — ABNORMAL LOW (ref 12.0–15.0)
Potassium: 4.8 mmol/L (ref 3.5–5.1)
Sodium: 141 mmol/L (ref 135–145)

## 2016-04-22 LAB — SURGICAL PCR SCREEN
MRSA, PCR: POSITIVE — AB
Staphylococcus aureus: POSITIVE — AB

## 2016-04-22 SURGERY — TRANSPOSITION, VEIN, BASILIC
Anesthesia: General | Site: Neck | Laterality: Right

## 2016-04-22 MED ORDER — OXYCODONE-ACETAMINOPHEN 5-325 MG PO TABS
1.0000 | ORAL_TABLET | ORAL | Status: DC | PRN
  Administered 2016-04-22: 1 via ORAL

## 2016-04-22 MED ORDER — CHLORHEXIDINE GLUCONATE CLOTH 2 % EX PADS: 6.0000 | MEDICATED_PAD | Freq: Once | CUTANEOUS | Status: DC

## 2016-04-22 MED ORDER — FENTANYL CITRATE (PF) 100 MCG/2ML IJ SOLN
INTRAMUSCULAR | Status: DC | PRN
  Administered 2016-04-22: 25 ug via INTRAVENOUS
  Administered 2016-04-22: 50 ug via INTRAVENOUS
  Administered 2016-04-22 (×2): 25 ug via INTRAVENOUS
  Administered 2016-04-22: 50 ug via INTRAVENOUS
  Administered 2016-04-22 (×3): 25 ug via INTRAVENOUS
  Administered 2016-04-22: 50 ug via INTRAVENOUS

## 2016-04-22 MED ORDER — PROPOFOL 10 MG/ML IV BOLUS
INTRAVENOUS | Status: DC | PRN
  Administered 2016-04-22: 170 mg via INTRAVENOUS

## 2016-04-22 MED ORDER — ONDANSETRON HCL 4 MG/2ML IJ SOLN
INTRAMUSCULAR | Status: DC | PRN
  Administered 2016-04-22: 4 mg via INTRAVENOUS

## 2016-04-22 MED ORDER — FENTANYL CITRATE (PF) 250 MCG/5ML IJ SOLN
INTRAMUSCULAR | Status: AC
  Filled 2016-04-22: qty 5

## 2016-04-22 MED ORDER — 0.9 % SODIUM CHLORIDE (POUR BTL) OPTIME
TOPICAL | Status: DC | PRN
  Administered 2016-04-22: 1000 mL

## 2016-04-22 MED ORDER — EPHEDRINE SULFATE 50 MG/ML IJ SOLN
INTRAMUSCULAR | Status: DC | PRN
  Administered 2016-04-22: 5 mg via INTRAVENOUS

## 2016-04-22 MED ORDER — FENTANYL CITRATE (PF) 100 MCG/2ML IJ SOLN: 25.0000 ug | INTRAMUSCULAR | Status: DC | PRN

## 2016-04-22 MED ORDER — MIDAZOLAM HCL 2 MG/2ML IJ SOLN
INTRAMUSCULAR | Status: AC
  Filled 2016-04-22: qty 2

## 2016-04-22 MED ORDER — LIDOCAINE HCL (PF) 1 % IJ SOLN
INTRAMUSCULAR | Status: AC
  Filled 2016-04-22: qty 30

## 2016-04-22 MED ORDER — HEPARIN SODIUM (PORCINE) 1000 UNIT/ML IJ SOLN
INTRAMUSCULAR | Status: AC
  Filled 2016-04-22: qty 1

## 2016-04-22 MED ORDER — MIDAZOLAM HCL 5 MG/5ML IJ SOLN
INTRAMUSCULAR | Status: DC | PRN
  Administered 2016-04-22: 2 mg via INTRAVENOUS

## 2016-04-22 MED ORDER — LIDOCAINE HCL (CARDIAC) 20 MG/ML IV SOLN
INTRAVENOUS | Status: DC | PRN
  Administered 2016-04-22: 60 mg via INTRAVENOUS

## 2016-04-22 MED ORDER — PROMETHAZINE HCL 25 MG/ML IJ SOLN: 6.2500 mg | INTRAMUSCULAR | Status: DC | PRN

## 2016-04-22 MED ORDER — SODIUM CHLORIDE 0.9 % IV SOLN
INTRAVENOUS | Status: DC
  Administered 2016-04-22: 10 mL/h via INTRAVENOUS

## 2016-04-22 MED ORDER — VANCOMYCIN HCL IN DEXTROSE 1-5 GM/200ML-% IV SOLN
1000.0000 mg | INTRAVENOUS | Status: AC
  Administered 2016-04-22: 1000 mg via INTRAVENOUS
  Administered 2016-04-22: 120 mg via INTRAVENOUS
  Administered 2016-04-22: 80 mg via INTRAVENOUS
  Administered 2016-04-22: 120 mg via INTRAVENOUS
  Filled 2016-04-22: qty 200

## 2016-04-22 MED ORDER — HEPARIN SODIUM (PORCINE) 1000 UNIT/ML IJ SOLN
INTRAMUSCULAR | Status: DC | PRN
  Administered 2016-04-22: 1000 [IU]

## 2016-04-22 MED ORDER — PHENYLEPHRINE HCL 10 MG/ML IJ SOLN
INTRAMUSCULAR | Status: DC | PRN
  Administered 2016-04-22 (×4): 80 ug via INTRAVENOUS

## 2016-04-22 MED ORDER — SODIUM CHLORIDE 0.9 % IV SOLN
INTRAVENOUS | Status: DC | PRN
  Administered 2016-04-22: 11:00:00 500 mL

## 2016-04-22 MED ORDER — OXYCODONE-ACETAMINOPHEN 5-325 MG PO TABS: 1.0000 | ORAL_TABLET | Freq: Four times a day (QID) | ORAL | 0 refills | Status: DC | PRN

## 2016-04-22 MED ORDER — OXYCODONE-ACETAMINOPHEN 5-325 MG PO TABS
ORAL_TABLET | ORAL | Status: AC
  Filled 2016-04-22: qty 1

## 2016-04-22 MED ORDER — MUPIROCIN 2 % EX OINT
1.0000 "application " | TOPICAL_OINTMENT | Freq: Once | CUTANEOUS | Status: DC
  Filled 2016-04-22: qty 22

## 2016-04-22 SURGICAL SUPPLY — 61 items
ARMBAND PINK RESTRICT EXTREMIT (MISCELLANEOUS) ×4 IMPLANT
BAG BANDED W/RUBBER/TAPE 36X54 (MISCELLANEOUS) ×4 IMPLANT
BAG DECANTER FOR FLEXI CONT (MISCELLANEOUS) ×4 IMPLANT
BIOPATCH RED 1 DISK 7.0 (GAUZE/BANDAGES/DRESSINGS) ×3 IMPLANT
BIOPATCH RED 1IN DISK 7.0MM (GAUZE/BANDAGES/DRESSINGS) ×1
CANISTER SUCTION 2500CC (MISCELLANEOUS) ×4 IMPLANT
CANNULA VESSEL 3MM 2 BLNT TIP (CANNULA) ×4 IMPLANT
CATH PALINDROME RT-P 15FX19CM (CATHETERS) IMPLANT
CATH PALINDROME RT-P 15FX23CM (CATHETERS) ×4 IMPLANT
CATH PALINDROME RT-P 15FX28CM (CATHETERS) IMPLANT
CATH PALINDROME RT-P 15FX55CM (CATHETERS) IMPLANT
CLIP LIGATING EXTRA MED SLVR (CLIP) ×8 IMPLANT
CLIP LIGATING EXTRA SM BLUE (MISCELLANEOUS) ×4 IMPLANT
CLIP TI WIDE RED SMALL 24 (CLIP) ×4 IMPLANT
CONT SPEC 4OZ CLIKSEAL STRL BL (MISCELLANEOUS) ×4 IMPLANT
COVER DOME SNAP 22 D (MISCELLANEOUS) ×4 IMPLANT
COVER PROBE W GEL 5X96 (DRAPES) ×8 IMPLANT
COVER SURGICAL LIGHT HANDLE (MISCELLANEOUS) ×4 IMPLANT
DECANTER SPIKE VIAL GLASS SM (MISCELLANEOUS) ×4 IMPLANT
DERMABOND ADVANCED (GAUZE/BANDAGES/DRESSINGS) ×2
DERMABOND ADVANCED .7 DNX12 (GAUZE/BANDAGES/DRESSINGS) ×2 IMPLANT
DRAPE C-ARM 42X72 X-RAY (DRAPES) ×4 IMPLANT
DRAPE CHEST BREAST 15X10 FENES (DRAPES) ×8 IMPLANT
DRSG COVADERM 4X6 (GAUZE/BANDAGES/DRESSINGS) ×8 IMPLANT
ELECT REM PT RETURN 9FT ADLT (ELECTROSURGICAL) ×4
ELECTRODE REM PT RTRN 9FT ADLT (ELECTROSURGICAL) ×2 IMPLANT
GEL ULTRASOUND 20GR AQUASONIC (MISCELLANEOUS) IMPLANT
GLOVE BIO SURGEON STRL SZ 6.5 (GLOVE) ×9 IMPLANT
GLOVE BIO SURGEONS STRL SZ 6.5 (GLOVE) ×3
GLOVE BIOGEL PI IND STRL 6.5 (GLOVE) ×6 IMPLANT
GLOVE BIOGEL PI INDICATOR 6.5 (GLOVE) ×6
GLOVE ECLIPSE 6.5 STRL STRAW (GLOVE) ×8 IMPLANT
GLOVE ECLIPSE 7.0 STRL STRAW (GLOVE) ×4 IMPLANT
GLOVE SS BIOGEL STRL SZ 7.5 (GLOVE) ×2 IMPLANT
GLOVE SUPERSENSE BIOGEL SZ 7.5 (GLOVE) ×2
GOWN STRL REUS W/ TWL LRG LVL3 (GOWN DISPOSABLE) ×8 IMPLANT
GOWN STRL REUS W/TWL LRG LVL3 (GOWN DISPOSABLE) ×8
KIT BASIN OR (CUSTOM PROCEDURE TRAY) ×4 IMPLANT
KIT ROOM TURNOVER OR (KITS) ×4 IMPLANT
NEEDLE 18GX1X1/2 (RX/OR ONLY) (NEEDLE) IMPLANT
NEEDLE 22X1 1/2 (OR ONLY) (NEEDLE) ×4 IMPLANT
NEEDLE HYPO 25GX1X1/2 BEV (NEEDLE) ×4 IMPLANT
NS IRRIG 1000ML POUR BTL (IV SOLUTION) ×4 IMPLANT
PACK CV ACCESS (CUSTOM PROCEDURE TRAY) ×4 IMPLANT
PACK SURGICAL SETUP 50X90 (CUSTOM PROCEDURE TRAY) ×4 IMPLANT
PAD ARMBOARD 7.5X6 YLW CONV (MISCELLANEOUS) ×8 IMPLANT
SOAP 2 % CHG 4 OZ (WOUND CARE) ×4 IMPLANT
SPONGE GAUZE 4X4 12PLY STER LF (GAUZE/BANDAGES/DRESSINGS) ×8 IMPLANT
SUT ETHILON 3 0 PS 1 (SUTURE) ×4 IMPLANT
SUT PROLENE 6 0 CC (SUTURE) ×4 IMPLANT
SUT SILK 2 0 SH (SUTURE) IMPLANT
SUT VIC AB 3-0 SH 27 (SUTURE) ×2
SUT VIC AB 3-0 SH 27X BRD (SUTURE) ×2 IMPLANT
SUT VICRYL 4-0 PS2 18IN ABS (SUTURE) ×4 IMPLANT
SYR 20CC LL (SYRINGE) ×4 IMPLANT
SYR 30ML LL (SYRINGE) IMPLANT
SYR 5ML LL (SYRINGE) ×12 IMPLANT
SYR CONTROL 10ML LL (SYRINGE) ×8 IMPLANT
SYRINGE 10CC LL (SYRINGE) ×4 IMPLANT
UNDERPAD 30X30 (UNDERPADS AND DIAPERS) ×4 IMPLANT
WATER STERILE IRR 1000ML POUR (IV SOLUTION) ×4 IMPLANT

## 2016-04-22 NOTE — Interval H&P Note (Signed)
History and Physical Interval Note:  04/22/2016 8:52 AM  Rachael George  has presented today for surgery, with the diagnosis of End Stage Renal Disease   The various methods of treatment have been discussed with the patient and family. After consideration of risks, benefits and other options for treatment, the patient has consented to  Procedure(s): FIRST STAGE BASILIC VEIN TRANSPOSITION (Left) EXCHANGE OF A DIALYSIS CATHETER (Left) as a surgical intervention .  The patient's history has been reviewed, patient examined, no change in status, stable for surgery.  I have reviewed the patient's chart and labs.  Questions were answered to the patient's satisfaction.     Adele Barthel

## 2016-04-22 NOTE — Anesthesia Postprocedure Evaluation (Signed)
Anesthesia Post Note  Patient: NGOZI ALVIDREZ  Procedure(s) Performed: Procedure(s) (LRB): FIRST STAGE BASILIC VEIN TRANSPOSITION LEFT ARM (Left) EXCHANGE OF A DIALYSIS CATHETER - INSERTION RIGHT INTERNAL JUGULAR & REMOVAL FROM LEFT INTERNAL JUGULAR (Right)  Patient location during evaluation: PACU Anesthesia Type: General Level of consciousness: awake and alert Pain management: pain level controlled Vital Signs Assessment: post-procedure vital signs reviewed and stable Respiratory status: spontaneous breathing, nonlabored ventilation, respiratory function stable and patient connected to nasal cannula oxygen Cardiovascular status: blood pressure returned to baseline and stable Postop Assessment: no signs of nausea or vomiting Anesthetic complications: no       Last Vitals:  Vitals:   04/22/16 1215 04/22/16 1230  BP: (!) 171/112 (!) 168/107  Pulse: (!) 103 88  Resp: 11 13  Temp: 36.5 C     Last Pain:  Vitals:   04/22/16 1215  TempSrc:   PainSc: 0-No pain                 Lynnix Schoneman S

## 2016-04-22 NOTE — Anesthesia Procedure Notes (Signed)
Procedure Name: LMA Insertion Date/Time: 04/22/2016 10:18 AM Performed by: Shirlyn Goltz Pre-anesthesia Checklist: Patient identified, Emergency Drugs available, Suction available and Patient being monitored Patient Re-evaluated:Patient Re-evaluated prior to inductionOxygen Delivery Method: Circle system utilized Preoxygenation: Pre-oxygenation with 100% oxygen Intubation Type: IV induction Ventilation: Mask ventilation without difficulty LMA: LMA inserted LMA Size: 4.0 Number of attempts: 1 Placement Confirmation: positive ETCO2 and breath sounds checked- equal and bilateral Tube secured with: Tape Dental Injury: Teeth and Oropharynx as per pre-operative assessment

## 2016-04-22 NOTE — H&P (View-Only) (Signed)
Patient name: Rachael George MRN: [id number] DOB: 30-Jul-1955 Sex: female  REASON FOR VISIT: Dialysis access  HPI: Rachael George is a 61 y.o. female who is left-handed.  She previously underwent a basilic vein transposition on the right.  This was working fine however she developed an infection in the right arm which ultimately lead to fistula rupture requiring ligation and catheter placement.  During her hospital stay she had vein mapping done of her left arm which showed an adequate basilic vein.  She is here today for further discussions.  She has healed her incisions on the right arm.  She is on dialysis Tuesday Thursday Saturday  Current Outpatient Prescriptions  Medication Sig Dispense Refill  . Acetaminophen-Codeine (TYLENOL/CODEINE #3) 300-30 MG tablet Take 1 tablet by mouth every 12 (twelve) hours as needed for pain. 50 tablet 0  . albuterol (PROVENTIL HFA;VENTOLIN HFA) 108 (90 Base) MCG/ACT inhaler Inhale 2 puffs into the lungs every 6 (six) hours as needed for wheezing or shortness of breath. 1 Inhaler 2  . allopurinol (ZYLOPRIM) 100 MG tablet Take 100 mg by mouth 2 (two) times daily. Reported on 09/02/2015    . B Complex-C-Folic Acid (DIALYVITE TABLET) TABS Take 1 tablet by mouth daily.    . calcitRIOL (ROCALTROL) 0.5 MCG capsule Take 2 mcg by mouth every Monday, Wednesday, and Friday with hemodialysis. With dialysis    . cinacalcet (SENSIPAR) 30 MG tablet Take 30 mg by mouth daily.    . colchicine 0.6 MG tablet Take 2 tabs (1.18m) at the onset of a gout flare, may repeat 1 tab (0.627m if symptoms persist 30 tablet 2  . famotidine (PEPCID) 20 MG tablet Take 20 mg by mouth 2 (two) times daily.    . pantoprazole (PROTONIX) 40 MG tablet Take 40 mg by mouth 2 (two) times daily.    . sevelamer (RENAGEL) 800 MG tablet Take 1,600-3,200 mg by mouth See admin instructions. 3,200 mg three times a day with meals and 1,600 mg twice a day with snacks    .  atorvastatin (LIPITOR) 80 MG tablet Take 1 tablet (80 mg total) by mouth daily. (Patient not taking: Reported on 04/06/2016) 90 tablet 1  . vancomycin IVPB Inject 1,000 mg into the vein every Monday, Wednesday, and Friday with hemodialysis. Indication:  Infected HD access Last Day of Therapy:  03/24/16 Labs - Sunday/Monday:  CBC/D, BMP, and vancomycin trough. Labs - Thursday:  BMP and vancomycin trough Labs - Every other week:  ESR and CRP (Patient not taking: Reported on 04/06/2016) 7 Units 0   No current facility-administered medications for this visit.     REVIEW OF SYSTEMS:  [X]  denotes positive finding, [ ]  denotes negative finding Cardiac  Comments:  Chest pain or chest pressure:    Shortness of breath upon exertion:    Short of breath when lying flat:    Irregular heart rhythm:    Constitutional    Fever or chills:      PHYSICAL EXAM: Vitals:   04/06/16 1058  BP: (!) 158/109  Pulse: 90  Resp: 20  Temp: 97.7 F (36.5 C)  TempSrc: Oral  SpO2: 97%  Weight: 180 lb 8 oz (81.9 kg)  Height: 5' 2"  (1.575 m)    GENERAL: The patient is a well-nourished female, in no acute distress. The vital signs are documented above. CARDIOVASCULAR: There is a regular rate and rhythm. PULMONARY: Non-labored respirations Well-healed right arm incision.  Palpable left radial pulse  MEDICAL ISSUES: Her vein  mapping while she was in the hospital reveals an adequate basilic vein on the left.  We discussed proceeding with a staged left basilic vein procedure.  The first stage is been scheduled for Wednesday, January 31.  The risks and benefits of the operation were discussed with the patient.  All of her questions were answered.  Annamarie Major, MD [facility] [phone] Pager [phone]

## 2016-04-22 NOTE — Transfer of Care (Signed)
Immediate Anesthesia Transfer of Care Note  Patient: Rachael George  Procedure(s) Performed: Procedure(s): FIRST STAGE BASILIC VEIN TRANSPOSITION LEFT ARM (Left) EXCHANGE OF A DIALYSIS CATHETER - INSERTION RIGHT INTERNAL JUGULAR & REMOVAL FROM LEFT INTERNAL JUGULAR (Right)  Patient Location: PACU  Anesthesia Type:General  Level of Consciousness: awake, alert , oriented and patient cooperative  Airway & Oxygen Therapy: Patient Spontanous Breathing and Patient connected to face mask oxygen  Post-op Assessment: Report given to RN and Post -op Vital signs reviewed and stable  Post vital signs: Reviewed and stable  Last Vitals:  Vitals:   04/22/16 0956  Pulse: 87  Resp: 20  Temp: 36.9 C    Last Pain:  Vitals:   04/22/16 0956  TempSrc: Oral      Patients Stated Pain Goal: 3 (16/60/60 0459)  Complications: No apparent anesthesia complications

## 2016-04-22 NOTE — Anesthesia Preprocedure Evaluation (Signed)
Anesthesia Evaluation  Patient identified by MRN, date of birth, ID band Patient awake    Reviewed: Allergy & Precautions, NPO status , Patient's Chart, lab work & pertinent test results  Airway Mallampati: II  TM Distance: >3 FB Neck ROM: Full    Dental no notable dental hx.    Pulmonary sleep apnea ,    Pulmonary exam normal breath sounds clear to auscultation       Cardiovascular hypertension, Normal cardiovascular exam Rhythm:Regular Rate:Normal     Neuro/Psych negative neurological ROS  negative psych ROS   GI/Hepatic negative GI ROS, Neg liver ROS,   Endo/Other  negative endocrine ROSHypothyroidism   Renal/GU Renal diseasenegative Renal ROS  negative genitourinary   Musculoskeletal  (+) Arthritis , Rheumatoid disorders,    Abdominal   Peds negative pediatric ROS (+)  Hematology negative hematology ROS (+)   Anesthesia Other Findings   Reproductive/Obstetrics negative OB ROS                             Anesthesia Physical Anesthesia Plan  ASA: III  Anesthesia Plan: General   Post-op Pain Management:    Induction: Intravenous  Airway Management Planned: LMA  Additional Equipment:   Intra-op Plan:   Post-operative Plan: Extubation in OR  Informed Consent: I have reviewed the patients History and Physical, chart, labs and discussed the procedure including the risks, benefits and alternatives for the proposed anesthesia with the patient or authorized representative who has indicated his/her understanding and acceptance.   Dental advisory given  Plan Discussed with: CRNA and Surgeon  Anesthesia Plan Comments:         Anesthesia Quick Evaluation

## 2016-04-22 NOTE — Op Note (Signed)
OPERATIVE NOTE  PROCEDURE: 1.  Right internal jugular vein tunneled dialysis catheter placement 2.  Right internal jugular vein cannulation under ultrasound guidance 3.  Left internal jugular vein tunneled dialysis catheter removal 4.  Placement of left basilic vein transposition (radiocubital arteriovenous fistula)  PRE-OPERATIVE DIAGNOSIS: end-stage renal failure  POST-OPERATIVE DIAGNOSIS: same as above  SURGEON: Adele Barthel, MD  ANESTHESIA: general  ESTIMATED BLOOD LOSS: 30 cc  FINDING(S): 1.  Tips of the catheter in the right atrium on fluoroscopy 2.  No obvious pneumothorax on fluoroscopy 3.  Pus at exit site of left internal jugular vein tunneled dialysis catheter  4.  High brachial bifurcation 5.  Faintly palpable thrill in fistula at end of case.  Pulsatile character noted under continuous doppler evaluation. 6.  Dopplerable left radial signal at end of case  SPECIMEN(S):  Catheter tip  INDICATIONS:   Rachael George is a 61 y.o. female who presents with end stage renal disease.  The patient presents for left tunneled dialysis catheter exchange and left arteriovenous fistula placement.  The patient is aware the risks of tunneled dialysis catheter placement include but are not limited to: bleeding, infection, central venous injury, pneumothorax, possible venous stenosis, possible malpositioning in the venous system, and possible infections related to long-term catheter presence.  Risk, benefits, and alternatives to access surgery were discussed.  The patient is aware the risks include but are not limited to: bleeding, infection, steal syndrome, nerve damage, ischemic monomelic neuropathy, thrombosis, failure to mature, need for additional procedures, death and stroke.  The patient is aware this is a staged transposition which requires two operations.  The patient was aware of these risks and agreed to proceed.  DESCRIPTION: After written full informed consent was obtained  from the patient, the patient was taken back to the operating room.  Prior to induction, the patient was given IV antibiotics.  After obtaining adequate sedation, the patient was prepped and draped in the standard fashion for a chest or neck tunneled dialysis catheter placement.  Immediately it was apparently that the previous tunneled dialysis catheter had been place in a fashion where the tunneled dialysis catheter exits onto the anterior wall of the shoulder, which likely contributes her complaints of left shoulder pain.   I elected to place a new right side tunneled dialysis catheter and remove this old tunneled dialysis catheter.  Under ultrasound guidance, the right internal jugular vein was cannulated with the 18 gauge needle.  A J-wire was then placed down into the inferior vena cava under fluoroscopic guidance.  The wire was then secured in place with a clamp to the drapes.  I then made stab incisions at the neck and exit sites.   I dissected from the exit site to the cannulation site with a tunneler.   The subcutaneous tunnel was dilated by passing a plastic dilator over the metal dissector. The wire was then unclamped and I removed the needle.  The skin tract and venotomy was dilated serially with dilators.  Finally, the dilator-sheath was placed under fluoroscopic guidance into the superior vena cava.  The dilator and wire were removed.  A 23 cm Palindrome catheter was placed under fluoroscopic guidance down into the right atrium.  The sheath was broken and peeled away while holding the catheter cuff at the level of the skin.  The back end of this catheter was transected, and docked onto the tunneler.  The distal catheter was delivered through the subcutaneous tunnel.  The catheter was  transected a second time, revealing the two lumens of this catheter.  The ports were docked onto these two lumens.  The catheter collar was then snapped into place.  Each port was tested by aspirating and flushing.  No  resistance was noted.  Each port was then thoroughly flushed with heparinized saline.  The catheter was secured in placed with two interrupted stitches of 3-0 Nylon tied to the catheter.  The neck incision was closed with a U-stitch of 4-0 Monocryl.  The neck and chest incision were cleaned and sterile bandages applied.  Each port was then loaded with concentrated heparin (1000 Units/mL) at the manufacturer recommended volumes to each port.  Sterile caps were applied to each port.    On completion fluoroscopy, the tips of the catheter were in the right atrium, and there was no evidence of pneumothorax.  At this point, I turned my attention to the left shoulder exit site of the tunneled dialysis catheter.  I bluntly dissected the exit site and frank pus drained from this site.  I bluntly enlarged the exit site and pulled out the tunneled dialysis catheter without any resistance.  I held pressure on the left internal jugular vein for 3 minutes.  The tip of the catheter was transected and passed off the field as a specimen.  At this point, the drapes were takened down and the patient repositioned to proceed with a left side access procedure.  The patient was reprepped and redraped.  I turned my attention first to identifying the patient's basilic vein and brachial artery.  Using SonoSite guidance, the location of these vessels were marked out on the skin.   The artery looked like there was a high bifurcation with both arteries equal size.  There was >6 cm distance the brachial artery and basilic vein so I then looked for an adequate cubital vein.  I found a cubital vein draining into the basilic vein ~2 cm lateral to the arteries.    I made a transverse incision at the level of the antecubitum and dissected through the subcutaneous tissue and fascia to gain exposure of the radial artery.  This was confirmed by compressing the radial artery and confirming loss of radial signal.  This was noted to be 2.5 mm in  diameter externally.  This was dissected out proximally and distally and agged with a vessel loop.  I then dissected out the cubital vein.  This was noted to be 3 mm in diameter externally.  The distal segment of the vein was ligated with a  2-0 silk, and the vein was transected.  The proximal segment was interrogated with serial dilators.  The vein accepted up to a 3.5 mm dilator without any difficulty.  I then instilled the heparinized saline into the vein and clamped it.  At this point, I reset my exposure of the brachial artery and placed the artery under tension proximally and distally.  I made an arteriotomy with a #11 blade, and then I extended the arteriotomy with a Potts scissor.  I injected heparinized saline proximal and distal to this arteriotomy.  The vein was then sewn to the artery in an end-to-side configuration with a running stitch of 7-0 Prolene.  Prior to completing this anastomosis, I allowed the vein and artery to backbleed.  There was no evidence of clot from any vessels.  I completed the anastomosis in the usual fashion and then released all vessel loops and clamps.  There was a faintly palpable  thrill  in the venous outflow, and there was a dopplerable radial signal.  There a pulsatile character to the fistula under doppler interrogation.  At this point, I irrigated out the surgical wound.  There was no further active bleeding.  The subcutaneous tissue was reapproximated with a running stitch of 3-0 Vicryl.  The skin was then reapproximated with a running subcuticular stitch of 4-0 Vicryl.  The skin was then cleaned, dried, and reinforced with Dermabond.  The patient tolerated this procedure well.    COMPLICATIONS: none  CONDITION: stable   Adele Barthel, MD, [facility] [place] Office: [phone] Pager: [phone]  04/22/2016, 10:52 AM

## 2016-04-22 NOTE — Progress Notes (Signed)
In phase 2 waiting on ride

## 2016-04-23 ENCOUNTER — Encounter (HOSPITAL_COMMUNITY): Payer: Self-pay | Admitting: Vascular Surgery

## 2016-04-24 DIAGNOSIS — D631 Anemia in chronic kidney disease: Secondary | ICD-10-CM | POA: Diagnosis not present

## 2016-04-24 DIAGNOSIS — N2581 Secondary hyperparathyroidism of renal origin: Secondary | ICD-10-CM | POA: Diagnosis not present

## 2016-04-24 DIAGNOSIS — E1129 Type 2 diabetes mellitus with other diabetic kidney complication: Secondary | ICD-10-CM | POA: Diagnosis not present

## 2016-04-24 DIAGNOSIS — D509 Iron deficiency anemia, unspecified: Secondary | ICD-10-CM | POA: Diagnosis not present

## 2016-04-24 DIAGNOSIS — T827XXA Infection and inflammatory reaction due to other cardiac and vascular devices, implants and grafts, initial encounter: Secondary | ICD-10-CM | POA: Diagnosis not present

## 2016-04-24 DIAGNOSIS — N186 End stage renal disease: Secondary | ICD-10-CM | POA: Diagnosis not present

## 2016-04-25 LAB — CATH TIP CULTURE: Culture: NO GROWTH

## 2016-04-27 DIAGNOSIS — T827XXA Infection and inflammatory reaction due to other cardiac and vascular devices, implants and grafts, initial encounter: Secondary | ICD-10-CM | POA: Diagnosis not present

## 2016-04-27 DIAGNOSIS — N186 End stage renal disease: Secondary | ICD-10-CM | POA: Diagnosis not present

## 2016-04-27 DIAGNOSIS — D631 Anemia in chronic kidney disease: Secondary | ICD-10-CM | POA: Diagnosis not present

## 2016-04-27 DIAGNOSIS — E1129 Type 2 diabetes mellitus with other diabetic kidney complication: Secondary | ICD-10-CM | POA: Diagnosis not present

## 2016-04-27 DIAGNOSIS — D509 Iron deficiency anemia, unspecified: Secondary | ICD-10-CM | POA: Diagnosis not present

## 2016-04-27 DIAGNOSIS — N2581 Secondary hyperparathyroidism of renal origin: Secondary | ICD-10-CM | POA: Diagnosis not present

## 2016-04-29 DIAGNOSIS — D509 Iron deficiency anemia, unspecified: Secondary | ICD-10-CM | POA: Diagnosis not present

## 2016-04-29 DIAGNOSIS — D631 Anemia in chronic kidney disease: Secondary | ICD-10-CM | POA: Diagnosis not present

## 2016-04-29 DIAGNOSIS — N2581 Secondary hyperparathyroidism of renal origin: Secondary | ICD-10-CM | POA: Diagnosis not present

## 2016-04-29 DIAGNOSIS — E1129 Type 2 diabetes mellitus with other diabetic kidney complication: Secondary | ICD-10-CM | POA: Diagnosis not present

## 2016-04-29 DIAGNOSIS — T827XXA Infection and inflammatory reaction due to other cardiac and vascular devices, implants and grafts, initial encounter: Secondary | ICD-10-CM | POA: Diagnosis not present

## 2016-04-29 DIAGNOSIS — N186 End stage renal disease: Secondary | ICD-10-CM | POA: Diagnosis not present

## 2016-04-30 ENCOUNTER — Telehealth: Payer: Self-pay

## 2016-04-30 NOTE — Telephone Encounter (Signed)
Phone call to [place], Therapist, sports, at [facility].  Advised that the pt. needs to follow up in the office to re-evaluate for new permanent access.  Informed that a declot is not advised with the early failure of the fistula.  Advised that the earliest appt. with Dr. Bridgett Larsson is 05/11/16.  Nurse will inform Dr. [name].  Pt's appt. will remain as noted above.

## 2016-04-30 NOTE — Telephone Encounter (Signed)
-----   Message from [name], MD sent at 04/29/2016  8:44 PM EST ----- Regarding: RE: need recommendation Follow up in office  ----- Message ----- From: [name], RN Sent: 04/29/2016   4:33 PM To: [name], MD Subject: need recommendation                            S/p (L) BVT 04/22/16.  Rec'd call from kidney center; apparently the pt's access had no bruit or thrill on Monday, 2/5, and a nurse spoke with [name], and was given an appt. to see you on 05/11/16.  Today, I was told Dr. [name] wants [place] to do a "declot", and doesn't want the pt. to wait to be seen in the office.  Would this be appropriate to add on to one of the partners to eval., or due to the timing that the AVF occluded, would you recommend starting over with new access?

## 2016-05-01 ENCOUNTER — Encounter: Payer: Self-pay | Admitting: Vascular Surgery

## 2016-05-01 DIAGNOSIS — T827XXA Infection and inflammatory reaction due to other cardiac and vascular devices, implants and grafts, initial encounter: Secondary | ICD-10-CM | POA: Diagnosis not present

## 2016-05-01 DIAGNOSIS — N186 End stage renal disease: Secondary | ICD-10-CM | POA: Diagnosis not present

## 2016-05-01 DIAGNOSIS — N2581 Secondary hyperparathyroidism of renal origin: Secondary | ICD-10-CM | POA: Diagnosis not present

## 2016-05-01 DIAGNOSIS — D631 Anemia in chronic kidney disease: Secondary | ICD-10-CM | POA: Diagnosis not present

## 2016-05-01 DIAGNOSIS — D509 Iron deficiency anemia, unspecified: Secondary | ICD-10-CM | POA: Diagnosis not present

## 2016-05-01 DIAGNOSIS — E1129 Type 2 diabetes mellitus with other diabetic kidney complication: Secondary | ICD-10-CM | POA: Diagnosis not present

## 2016-05-04 ENCOUNTER — Encounter: Payer: Self-pay | Admitting: Vascular Surgery

## 2016-05-04 DIAGNOSIS — E1129 Type 2 diabetes mellitus with other diabetic kidney complication: Secondary | ICD-10-CM | POA: Diagnosis not present

## 2016-05-04 DIAGNOSIS — N186 End stage renal disease: Secondary | ICD-10-CM | POA: Diagnosis not present

## 2016-05-04 DIAGNOSIS — D509 Iron deficiency anemia, unspecified: Secondary | ICD-10-CM | POA: Diagnosis not present

## 2016-05-04 DIAGNOSIS — N2581 Secondary hyperparathyroidism of renal origin: Secondary | ICD-10-CM | POA: Diagnosis not present

## 2016-05-04 DIAGNOSIS — T827XXA Infection and inflammatory reaction due to other cardiac and vascular devices, implants and grafts, initial encounter: Secondary | ICD-10-CM | POA: Diagnosis not present

## 2016-05-04 DIAGNOSIS — D631 Anemia in chronic kidney disease: Secondary | ICD-10-CM | POA: Diagnosis not present

## 2016-05-05 ENCOUNTER — Ambulatory Visit
Admission: RE | Admit: 2016-05-05 | Discharge: 2016-05-05 | Disposition: A | Discharge status: Home / Self Care | Payer: Medicare Other | Source: Ambulatory Visit | Attending: Family Medicine | Admitting: Family Medicine

## 2016-05-05 ENCOUNTER — Encounter: Payer: Self-pay | Admitting: Family Medicine

## 2016-05-05 ENCOUNTER — Ambulatory Visit: Payer: Medicare Other | Attending: Family Medicine | Admitting: Family Medicine

## 2016-05-05 VITALS — BP 154/89 | HR 88 | Temp 98.6°F | Ht 62.0 in | Wt 175.8 lb

## 2016-05-05 DIAGNOSIS — Z0000 Encounter for general adult medical examination without abnormal findings: Secondary | ICD-10-CM | POA: Diagnosis not present

## 2016-05-05 DIAGNOSIS — E7800 Pure hypercholesterolemia, unspecified: Secondary | ICD-10-CM | POA: Insufficient documentation

## 2016-05-05 DIAGNOSIS — Z886 Allergy status to analgesic agent status: Secondary | ICD-10-CM | POA: Diagnosis not present

## 2016-05-05 DIAGNOSIS — I120 Hypertensive chronic kidney disease with stage 5 chronic kidney disease or end stage renal disease: Secondary | ICD-10-CM | POA: Diagnosis not present

## 2016-05-05 DIAGNOSIS — M109 Gout, unspecified: Secondary | ICD-10-CM | POA: Diagnosis not present

## 2016-05-05 DIAGNOSIS — R7303 Prediabetes: Secondary | ICD-10-CM | POA: Diagnosis not present

## 2016-05-05 DIAGNOSIS — Z992 Dependence on renal dialysis: Secondary | ICD-10-CM | POA: Diagnosis not present

## 2016-05-05 DIAGNOSIS — Z9889 Other specified postprocedural states: Secondary | ICD-10-CM | POA: Insufficient documentation

## 2016-05-05 DIAGNOSIS — M069 Rheumatoid arthritis, unspecified: Secondary | ICD-10-CM | POA: Diagnosis not present

## 2016-05-05 DIAGNOSIS — I10 Essential (primary) hypertension: Secondary | ICD-10-CM

## 2016-05-05 DIAGNOSIS — G4730 Sleep apnea, unspecified: Secondary | ICD-10-CM | POA: Insufficient documentation

## 2016-05-05 DIAGNOSIS — Z888 Allergy status to other drugs, medicaments and biological substances status: Secondary | ICD-10-CM | POA: Diagnosis not present

## 2016-05-05 DIAGNOSIS — Z90710 Acquired absence of both cervix and uterus: Secondary | ICD-10-CM | POA: Diagnosis not present

## 2016-05-05 DIAGNOSIS — K219 Gastro-esophageal reflux disease without esophagitis: Secondary | ICD-10-CM | POA: Insufficient documentation

## 2016-05-05 DIAGNOSIS — E039 Hypothyroidism, unspecified: Secondary | ICD-10-CM | POA: Diagnosis not present

## 2016-05-05 DIAGNOSIS — J4520 Mild intermittent asthma, uncomplicated: Secondary | ICD-10-CM

## 2016-05-05 DIAGNOSIS — Z87442 Personal history of urinary calculi: Secondary | ICD-10-CM | POA: Diagnosis not present

## 2016-05-05 DIAGNOSIS — Z1159 Encounter for screening for other viral diseases: Secondary | ICD-10-CM

## 2016-05-05 DIAGNOSIS — Z1231 Encounter for screening mammogram for malignant neoplasm of breast: Secondary | ICD-10-CM

## 2016-05-05 DIAGNOSIS — Z880 Allergy status to penicillin: Secondary | ICD-10-CM | POA: Diagnosis not present

## 2016-05-05 DIAGNOSIS — N186 End stage renal disease: Secondary | ICD-10-CM

## 2016-05-05 MED ORDER — FLUTICASONE PROPIONATE HFA 110 MCG/ACT IN AERO: 1.0000 | INHALATION_SPRAY | Freq: Two times a day (BID) | RESPIRATORY_TRACT | 12 refills | Status: DC

## 2016-05-05 NOTE — Progress Notes (Signed)
Subjective:  Patient ID: Rachael George, female    DOB: 12/21/1955  Age: 61 y.o. MRN: [id number]  CC: Annual Exam; Hypertension; Asthma; and Hyperlipidemia   HPI Rachael George s a 61 year old female with a history of hypertension, hyperlipidemia, end-stage renal disease (on hemodialysis Mondays, Wednesdays and Fridays), gout, asthma prediabetes presents today for complete physical exam.  She endorses shortness of breath which is worse at night and is having to use her Proventil inhaler more often than usual. Denies chest pains, wheezing or shortness of breath at this time. Also states shortness of breath is worse during dialysis.  Past Medical History:  Diagnosis Date  . Anemia   . Asthma   . GERD (gastroesophageal reflux disease)   . Gout   . Heart murmur   . Heavy menstrual bleeding   . High cholesterol   . History of blood transfusion 02/2016- last one   1990's- several ones   . History of kidney stones 1980's  . Hyperlipemia 12/07/2012  . Hypertension   . Hypothyroidism   . Kidney disease   . Kidney stones   . Pre-diabetes   . Prediabetes   . RA (rheumatoid arthritis) ([place])   . Sleep apnea    [place]- have CPAP, not able to use every night     Past Surgical History:  Procedure Laterality Date  . ABDOMINAL HYSTERECTOMY  2000  . BASCILIC VEIN TRANSPOSITION Right 12/06/2014   Procedure: FIRST STAGE BASILIC VEIN TRANSPOSITION - RIGHT;  Surgeon: Serafina Mitchell, MD;  Location: [place];  Service: Vascular;  Laterality: Right;  . BASCILIC VEIN TRANSPOSITION Right 02/07/2015   Procedure: RIGHT ARM SECOND STAGE BASILIC VEIN TRANSPOSITION;  Surgeon: Serafina Mitchell, MD;  Location: [place];  Service: Vascular;  Laterality: Right;  . BASCILIC VEIN TRANSPOSITION Left 04/22/2016   Procedure: FIRST STAGE BASILIC VEIN TRANSPOSITION LEFT ARM;  Surgeon: Conrad Davy, MD;  Location: [place];  Service: Vascular;  Laterality: Left;  . ECTOPIC PREGNANCY SURGERY  02/1982  . EXCHANGE OF A  DIALYSIS CATHETER Right 04/22/2016   Procedure: EXCHANGE OF A DIALYSIS CATHETER - INSERTION RIGHT INTERNAL JUGULAR & REMOVAL FROM LEFT INTERNAL JUGULAR;  Surgeon: Conrad , MD;  Location: [place];  Service: Vascular;  Laterality: Right;  . I&D EXTREMITY Right 02/07/2015   Procedure: IRRIGATION AND DEBRIDEMENT RIGHT ARM HEMATOMA;  Surgeon: Serafina Mitchell, MD;  Location: [place];  Service: Vascular;  Laterality: Right;  . INSERTION OF DIALYSIS CATHETER  03/03/2016   Procedure: INSERTION OF DIALYSIS CATHETER;  Surgeon: Elam Dutch, MD;  Location: [place];  Service: Vascular;;  . left shoulder surgery  08/2005  . LIGATION OF ARTERIOVENOUS  FISTULA  03/03/2016   Procedure: LIGATION OF ARTERIOVENOUS  FISTULA;  Surgeon: Elam Dutch, MD;  Location: [place];  Service: Vascular;;  . TEE WITHOUT CARDIOVERSION N/A 03/06/2016   Procedure: TRANSESOPHAGEAL ECHOCARDIOGRAM (TEE);  Surgeon: Sueanne Margarita, MD;  Location: [place];  Service: Cardiovascular;  Laterality: N/A;  . TUBAL LIGATION  11/1986    Allergies  Allergen Reactions  . Atacand [Candesartan] Anaphylaxis  . Lisinopril Anaphylaxis  . Motrin [Ibuprofen] Anaphylaxis  . Nsaids Anaphylaxis  . Tolmetin Anaphylaxis  . Penicillins Rash    Has patient had a PCN reaction causing immediate rash, facial/tongue/throat swelling, SOB or lightheadedness with hypotension: No Has patient had a PCN reaction causing severe rash involving mucus membranes or skin necrosis: No Has patient had a PCN reaction that  required hospitalization No Has patient had a PCN reaction occurring within the last 10 years: No If all of the above answers are "NO", then may proceed with Cephalosporin use.      Outpatient Medications Prior to Visit  Medication Sig Dispense Refill  . acetaminophen (TYLENOL) 500 MG tablet Take 500 mg by mouth every 6 (six) hours as needed.    Marland Kitchen albuterol (PROVENTIL HFA;VENTOLIN HFA) 108 (90 Base) MCG/ACT inhaler Inhale 2 puffs into the  lungs every 6 (six) hours as needed for wheezing or shortness of breath. 1 Inhaler 2  . allopurinol (ZYLOPRIM) 100 MG tablet Take 100 mg by mouth 2 (two) times daily. Reported on 09/02/2015    . B Complex-C-Folic Acid (DIALYVITE TABLET) TABS Take 1 tablet by mouth daily.    . cinacalcet (SENSIPAR) 30 MG tablet Take 60 mg by mouth every other day.     . pantoprazole (PROTONIX) 40 MG tablet Take 40 mg by mouth 2 (two) times daily.    . promethazine (PHENERGAN) 25 MG tablet Take 25 mg by mouth daily as needed for nausea or vomiting.     . sevelamer (RENAGEL) 800 MG tablet Take 1,600-3,200 mg by mouth See admin instructions. 3,200 mg three times a day with meals and 1,600 mg twice a day with snacks    . colchicine 0.6 MG tablet Take 2 tabs (1.2mg ) at the onset of a gout flare, may repeat 1 tab (0.6mg ) if symptoms persist (Patient not taking: Reported on 05/05/2016) 30 tablet 2  . oxyCODONE-acetaminophen (PERCOCET/ROXICET) 5-325 MG tablet Take 1 tablet by mouth every 6 (six) hours as needed. (Patient not taking: Reported on 05/05/2016) 6 tablet 0   No facility-administered medications prior to visit.     ROS Review of Systems  Constitutional: Negative for activity change, appetite change and fatigue.  HENT: Negative for congestion, sinus pressure and sore throat.   Eyes: Negative for visual disturbance.  Respiratory: Negative for cough, chest tightness, shortness of breath and wheezing.   Cardiovascular: Negative for chest pain and palpitations.  Gastrointestinal: Negative for abdominal distention, abdominal pain and constipation.  Endocrine: Negative for polydipsia.  Genitourinary: Negative for dysuria and frequency.  Musculoskeletal: Negative for arthralgias and back pain.  Skin: Negative for rash.  Neurological: Negative for tremors, light-headedness and numbness.  Hematological: Does not bruise/bleed easily.  Psychiatric/Behavioral: Negative for agitation and behavioral problems.     Objective:  BP (!) 154/89 (BP Location: Right Arm, Patient Position: Sitting, Cuff Size: Small)   Pulse 88   Temp 98.6 F (37 C) (Oral)   Ht 5\' 2"  (1.575 m)   Wt 175 lb 12.8 oz (79.7 kg)   SpO2 100%   BMI 32.15 kg/m   BP/Weight 05/05/2016 04/22/2016 10/05/9676  Systolic BP 938 101 751  Diastolic BP 89 97 025  Wt. (Lbs) 175.8 - 180.5  BMI 32.15 - 33.01      Physical Exam  Constitutional: She is oriented to person, place, and time. She appears well-developed and well-nourished. No distress.  HENT:  Head: Normocephalic.  Right Ear: External ear normal.  Left Ear: External ear normal.  Nose: Nose normal.  Mouth/Throat: Oropharynx is clear and moist.  Eyes: Conjunctivae and EOM are normal. Pupils are equal, round, and reactive to light.  Neck: Normal range of motion. No JVD present.  Cardiovascular: Normal rate, regular rhythm, normal heart sounds and intact distal pulses.  Exam reveals no gallop.   No murmur heard. Pulmonary/Chest: Effort normal and breath sounds normal. No  respiratory distress. She has no wheezes. She has no rales. She exhibits no tenderness. Right breast exhibits no mass and no tenderness. Left breast exhibits no mass and no tenderness.  Right subclavian permacath for hemodialysis  Abdominal: Soft. Bowel sounds are normal. She exhibits no distension and no mass. There is no tenderness.  Musculoskeletal: Normal range of motion. She exhibits no edema or tenderness.  Neurological: She is alert and oriented to person, place, and time. She has normal reflexes.  Skin: Skin is warm and dry. She is not diaphoretic.  Psychiatric: She has a normal mood and affect.     Lab Results  Component Value Date   HGBA1C 5.2 03/26/2016    Assessment & Plan:   1. Annual physical exam Up to date on colonoscopy - last year normal as per patient Status post hysterectomy secondary to fibroids; no indication for Pap smear  2. Mild intermittent asthma without  complication Uncontrolled Flovent added to regimen Continue MDI - fluticasone (FLOVENT HFA) 110 MCG/ACT inhaler; Inhale 1 puff into the lungs 2 (two) times daily.  Dispense: 1 Inhaler; Refill: 12  3. End stage renal disease ([place]) Hemodialysis as per protocol Currently being evaluated for renal transplant - COMPLETE METABOLIC PANEL WITH GFR - Lipid Panel w/reflex Direct LDL  4. Prediabetes Controlled with A1c of 5.2 - Ambulatory referral to Podiatry  5. Need for hepatitis C screening test - Hepatitis C antibody, reflex  6. Essential hypertension, benign Uncontrolled Will make no changes to regimen to prevent hypotension during hemodialysis   Meds ordered this encounter  Medications  . fluticasone (FLOVENT HFA) 110 MCG/ACT inhaler    Sig: Inhale 1 puff into the lungs 2 (two) times daily.    Dispense:  1 Inhaler    Refill:  12    Follow-up: Return in about 6 months (around 11/02/2016) for follow up of chronic medical conditions.   Arnoldo Morale MD

## 2016-05-06 DIAGNOSIS — D509 Iron deficiency anemia, unspecified: Secondary | ICD-10-CM | POA: Diagnosis not present

## 2016-05-06 DIAGNOSIS — E1129 Type 2 diabetes mellitus with other diabetic kidney complication: Secondary | ICD-10-CM | POA: Diagnosis not present

## 2016-05-06 DIAGNOSIS — N2581 Secondary hyperparathyroidism of renal origin: Secondary | ICD-10-CM | POA: Diagnosis not present

## 2016-05-06 DIAGNOSIS — E7800 Pure hypercholesterolemia, unspecified: Secondary | ICD-10-CM | POA: Diagnosis not present

## 2016-05-06 DIAGNOSIS — N186 End stage renal disease: Secondary | ICD-10-CM | POA: Diagnosis not present

## 2016-05-06 DIAGNOSIS — T827XXA Infection and inflammatory reaction due to other cardiac and vascular devices, implants and grafts, initial encounter: Secondary | ICD-10-CM | POA: Diagnosis not present

## 2016-05-06 DIAGNOSIS — D631 Anemia in chronic kidney disease: Secondary | ICD-10-CM | POA: Diagnosis not present

## 2016-05-07 DIAGNOSIS — N186 End stage renal disease: Secondary | ICD-10-CM | POA: Diagnosis not present

## 2016-05-07 DIAGNOSIS — Z7682 Awaiting organ transplant status: Secondary | ICD-10-CM | POA: Diagnosis not present

## 2016-05-07 DIAGNOSIS — Z01810 Encounter for preprocedural cardiovascular examination: Secondary | ICD-10-CM | POA: Diagnosis not present

## 2016-05-07 DIAGNOSIS — Z992 Dependence on renal dialysis: Secondary | ICD-10-CM | POA: Diagnosis not present

## 2016-05-08 DIAGNOSIS — E7800 Pure hypercholesterolemia, unspecified: Secondary | ICD-10-CM | POA: Diagnosis not present

## 2016-05-08 DIAGNOSIS — N186 End stage renal disease: Secondary | ICD-10-CM | POA: Diagnosis not present

## 2016-05-08 DIAGNOSIS — D509 Iron deficiency anemia, unspecified: Secondary | ICD-10-CM | POA: Diagnosis not present

## 2016-05-08 DIAGNOSIS — N2581 Secondary hyperparathyroidism of renal origin: Secondary | ICD-10-CM | POA: Diagnosis not present

## 2016-05-08 DIAGNOSIS — D631 Anemia in chronic kidney disease: Secondary | ICD-10-CM | POA: Diagnosis not present

## 2016-05-08 DIAGNOSIS — T827XXA Infection and inflammatory reaction due to other cardiac and vascular devices, implants and grafts, initial encounter: Secondary | ICD-10-CM | POA: Diagnosis not present

## 2016-05-08 DIAGNOSIS — E1129 Type 2 diabetes mellitus with other diabetic kidney complication: Secondary | ICD-10-CM | POA: Diagnosis not present

## 2016-05-11 ENCOUNTER — Encounter: Payer: Self-pay | Admitting: Vascular Surgery

## 2016-05-11 ENCOUNTER — Other Ambulatory Visit: Payer: Self-pay

## 2016-05-11 ENCOUNTER — Ambulatory Visit (INDEPENDENT_AMBULATORY_CARE_PROVIDER_SITE_OTHER): Payer: Self-pay | Admitting: Vascular Surgery

## 2016-05-11 VITALS — BP 154/103 | HR 104 | Temp 98.8°F | Resp 16 | Ht 62.0 in | Wt 175.0 lb

## 2016-05-11 DIAGNOSIS — N186 End stage renal disease: Secondary | ICD-10-CM | POA: Diagnosis not present

## 2016-05-11 DIAGNOSIS — N2581 Secondary hyperparathyroidism of renal origin: Secondary | ICD-10-CM | POA: Diagnosis not present

## 2016-05-11 DIAGNOSIS — T827XXA Infection and inflammatory reaction due to other cardiac and vascular devices, implants and grafts, initial encounter: Secondary | ICD-10-CM | POA: Diagnosis not present

## 2016-05-11 DIAGNOSIS — D631 Anemia in chronic kidney disease: Secondary | ICD-10-CM | POA: Diagnosis not present

## 2016-05-11 DIAGNOSIS — E1129 Type 2 diabetes mellitus with other diabetic kidney complication: Secondary | ICD-10-CM | POA: Diagnosis not present

## 2016-05-11 DIAGNOSIS — D509 Iron deficiency anemia, unspecified: Secondary | ICD-10-CM | POA: Diagnosis not present

## 2016-05-11 NOTE — Progress Notes (Signed)
Postoperative Access Visit   History of Present Illness  Rachael George is a 61 y.o. year old female who presents for postoperative follow-up for: RIJV TDC, L 1st BVT (Date: 04/22/16).  The patient's wounds are healed.  The patient notes no steal symptoms.  The patient is able to complete their activities of daily living.  The patient's current symptoms are: none.  This patient's fistula clotted after discharge from the hospital.  Past Medical History:  Diagnosis Date  . Anemia   . Asthma   . GERD (gastroesophageal reflux disease)   . Gout   . Heart murmur   . Heavy menstrual bleeding   . High cholesterol   . History of blood transfusion 02/2016- last one   1990's- several ones   . History of kidney stones 1980's  . Hyperlipemia 12/07/2012  . Hypertension   . Hypothyroidism   . Kidney disease   . Kidney stones   . Pre-diabetes   . Prediabetes   . RA (rheumatoid arthritis) ([place])   . Sleep apnea    [place]- have CPAP, not able to use every night     Past Surgical History:  Procedure Laterality Date  . ABDOMINAL HYSTERECTOMY  2000  . BASCILIC VEIN TRANSPOSITION Right 12/06/2014   Procedure: FIRST STAGE BASILIC VEIN TRANSPOSITION - RIGHT;  Surgeon: Serafina Mitchell, MD;  Location: [facility];  Service: Vascular;  Laterality: Right;  . BASCILIC VEIN TRANSPOSITION Right 02/07/2015   Procedure: RIGHT ARM SECOND STAGE BASILIC VEIN TRANSPOSITION;  Surgeon: Serafina Mitchell, MD;  Location: [facility];  Service: Vascular;  Laterality: Right;  . BASCILIC VEIN TRANSPOSITION Left 04/22/2016   Procedure: FIRST STAGE BASILIC VEIN TRANSPOSITION LEFT ARM;  Surgeon: Conrad Vandiver, MD;  Location: [facility];  Service: Vascular;  Laterality: Left;  . ECTOPIC PREGNANCY SURGERY  02/1982  . EXCHANGE OF A DIALYSIS CATHETER Right 04/22/2016   Procedure: EXCHANGE OF A DIALYSIS CATHETER - INSERTION RIGHT INTERNAL JUGULAR & REMOVAL FROM LEFT INTERNAL JUGULAR;  Surgeon: Conrad Dillonvale, MD;  Location: [facility];  Service:  Vascular;  Laterality: Right;  . I&D EXTREMITY Right 02/07/2015   Procedure: IRRIGATION AND DEBRIDEMENT RIGHT ARM HEMATOMA;  Surgeon: Serafina Mitchell, MD;  Location: [facility];  Service: Vascular;  Laterality: Right;  . INSERTION OF DIALYSIS CATHETER  03/03/2016   Procedure: INSERTION OF DIALYSIS CATHETER;  Surgeon: Elam Dutch, MD;  Location: [facility];  Service: Vascular;;  . left shoulder surgery  08/2005  . LIGATION OF ARTERIOVENOUS  FISTULA  03/03/2016   Procedure: LIGATION OF ARTERIOVENOUS  FISTULA;  Surgeon: Elam Dutch, MD;  Location: [facility];  Service: Vascular;;  . TEE WITHOUT CARDIOVERSION N/A 03/06/2016   Procedure: TRANSESOPHAGEAL ECHOCARDIOGRAM (TEE);  Surgeon: Sueanne Margarita, MD;  Location: [facility];  Service: Cardiovascular;  Laterality: N/A;  . TUBAL LIGATION  11/1986    Social History   Social History  . Marital status: Legally Separated    Spouse name: N/A  . Number of children: 3  . Years of education: N/A   Occupational History  . Not on file.   Social History Main Topics  . Smoking status: Never Smoker  . Smokeless tobacco: Never Used  . Alcohol use 0.0 oz/week     Comment: occ - 1 drink rarely  . Drug use: No  . Sexual activity: Not on file   Other Topics Concern  . 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 Hypertension Mother   . Breast cancer Maternal Aunt   . Deep vein thrombosis Daughter   . Hyperlipidemia Daughter   . Hypertension Daughter   . Prostate cancer Maternal Grandmother     Current Outpatient Prescriptions  Medication Sig Dispense Refill  . acetaminophen (TYLENOL) 500 MG tablet Take 500 mg by mouth every 6 (six) hours as needed.    Marland Kitchen albuterol (PROVENTIL  HFA;VENTOLIN HFA) 108 (90 Base) MCG/ACT inhaler Inhale 2 puffs into the lungs every 6 (six) hours as needed for wheezing or shortness of breath. 1 Inhaler 2  . allopurinol (ZYLOPRIM) 100 MG tablet Take 100 mg by mouth 2 (two) times daily. Reported on 09/02/2015    . amLODipine (NORVASC) 10 MG tablet Take 10 mg by mouth daily.    . B Complex-C-Folic Acid (DIALYVITE TABLET) TABS Take 1 tablet by mouth daily.    . cinacalcet (SENSIPAR) 30 MG tablet Take 60 mg by mouth every other day.     . colchicine 0.6 MG tablet Take 2 tabs (1.2mg ) at the onset of a gout flare, may repeat 1 tab (0.6mg ) if symptoms persist (Patient not taking: Reported on 05/05/2016) 30 tablet 2  . fluticasone (FLOVENT HFA) 110 MCG/ACT inhaler Inhale 1 puff into the lungs 2 (two) times daily. 1 Inhaler 12  . pantoprazole (PROTONIX) 40 MG tablet Take 40 mg by mouth 2 (two) times daily.    . promethazine (PHENERGAN) 25 MG tablet Take 25 mg by mouth daily as needed for nausea or vomiting.     . sevelamer (RENAGEL) 800 MG tablet Take 1,600-3,200 mg by mouth See admin instructions. 3,200 mg three times a day with meals and 1,600 mg twice a day with snacks     No current facility-administered medications for this visit.      Allergies  Allergen Reactions  . Atacand [Candesartan] Anaphylaxis  . Lisinopril Anaphylaxis  . Motrin [Ibuprofen] Anaphylaxis  . Nsaids Anaphylaxis  . Tolmetin Anaphylaxis  . Penicillins Rash    Has patient had a PCN reaction causing immediate rash, facial/tongue/throat swelling, SOB or lightheadedness with hypotension: No Has patient had a PCN reaction causing severe rash involving mucus membranes or skin necrosis: No Has patient had a PCN reaction that required hospitalization No Has patient had a PCN reaction occurring within the last 10 years: No If all of the above answers are "NO", then may proceed with Cephalosporin use.      REVIEW OF SYSTEMS:  (Positives checked otherwise  negative)  CARDIOVASCULAR:   [ ]  chest pain,  [ ]  chest pressure,  [ ]  palpitations,  [ ]  shortness of breath when laying flat,  [ ]  shortness of breath with exertion,   [ ]  pain in feet when walking,  [ ]  pain in feet when laying flat, [ ]  history of blood clot in veins (DVT),  [ ]  history of phlebitis,  [ ]  swelling in legs,  [ ]  varicose veins  PULMONARY:   [ ]  productive cough,  [ ]  asthma,  [ ]  wheezing  NEUROLOGIC:   [ ]  weakness in arms or legs,  [ ]   numbness in arms or legs,  [ ]  difficulty speaking or slurred speech,  [ ]  temporary loss of vision in one eye,  [ ]  dizziness  HEMATOLOGIC:   [ ]  bleeding problems,  [ ]  problems with blood clotting too easily  MUSCULOSKEL:   [ ]  joint pain, [ ]  joint swelling  GASTROINTEST:   [ ]  vomiting blood,  [ ]  blood in stool     GENITOURINARY:   [ ]  burning with urination,  [ ]  blood in urine [x]  end stage renal disease-HD: M/W/F   PSYCHIATRIC:   [ ]  history of major depression  INTEGUMENTARY:   [ ]  rashes,  [ ]  ulcers  CONSTITUTIONAL:   [ ]  fever,  [ ]  chills   Physical Examination Vitals:   05/11/16 1348  BP: (!) 154/103  Pulse: (!) 104  Resp: 16  Temp: 98.8 F (37.1 C)    Pulmonary: Sym exp, good air movt, CTAB, no rales, rhonchi, & wheezing  Cardiac: RRR, Nl S1, S2, no Murmurs, rubs or gallops  LUE: Incision is healed, skin feels warm, hand grip is 5/5, sensation in digits is  intact, no palpable thrill, bruit cannot  be auscultated    Medical Decision Making  Rachael George is a 61 y.o. year old female who presents s/p failed L staged BVT.   Next access option would be a looped upper arm AVG given the high bifurcation in this patient, which is the likely cause of failure in this patient. Risk, benefits, and alternatives to access surgery were discussed.   The patient is aware the risks include but are not limited to: bleeding, infection, steal syndrome, nerve damage, ischemic monomelic  neuropathy, thrombosis, failure to mature, need for additional procedures, death and stroke.   The patient agrees to proceed forward with the procedure.  She is scheduled for: 2 MAR 18 with Dr. Trula Slade.  Thank you for allowing [place] to participate in this patient's care.  Adele Barthel, MD, FACS [facility] Office: [phone] Pager: [phone]

## 2016-05-13 DIAGNOSIS — D631 Anemia in chronic kidney disease: Secondary | ICD-10-CM | POA: Diagnosis not present

## 2016-05-13 DIAGNOSIS — N2581 Secondary hyperparathyroidism of renal origin: Secondary | ICD-10-CM | POA: Diagnosis not present

## 2016-05-13 DIAGNOSIS — T827XXA Infection and inflammatory reaction due to other cardiac and vascular devices, implants and grafts, initial encounter: Secondary | ICD-10-CM | POA: Diagnosis not present

## 2016-05-13 DIAGNOSIS — D509 Iron deficiency anemia, unspecified: Secondary | ICD-10-CM | POA: Diagnosis not present

## 2016-05-13 DIAGNOSIS — E1129 Type 2 diabetes mellitus with other diabetic kidney complication: Secondary | ICD-10-CM | POA: Diagnosis not present

## 2016-05-13 DIAGNOSIS — N186 End stage renal disease: Secondary | ICD-10-CM | POA: Diagnosis not present

## 2016-05-15 DIAGNOSIS — T827XXA Infection and inflammatory reaction due to other cardiac and vascular devices, implants and grafts, initial encounter: Secondary | ICD-10-CM | POA: Diagnosis not present

## 2016-05-15 DIAGNOSIS — N2581 Secondary hyperparathyroidism of renal origin: Secondary | ICD-10-CM | POA: Diagnosis not present

## 2016-05-15 DIAGNOSIS — E1129 Type 2 diabetes mellitus with other diabetic kidney complication: Secondary | ICD-10-CM | POA: Diagnosis not present

## 2016-05-15 DIAGNOSIS — N186 End stage renal disease: Secondary | ICD-10-CM | POA: Diagnosis not present

## 2016-05-15 DIAGNOSIS — D631 Anemia in chronic kidney disease: Secondary | ICD-10-CM | POA: Diagnosis not present

## 2016-05-15 DIAGNOSIS — D509 Iron deficiency anemia, unspecified: Secondary | ICD-10-CM | POA: Diagnosis not present

## 2016-05-18 DIAGNOSIS — E1129 Type 2 diabetes mellitus with other diabetic kidney complication: Secondary | ICD-10-CM | POA: Diagnosis not present

## 2016-05-18 DIAGNOSIS — D631 Anemia in chronic kidney disease: Secondary | ICD-10-CM | POA: Diagnosis not present

## 2016-05-18 DIAGNOSIS — T827XXA Infection and inflammatory reaction due to other cardiac and vascular devices, implants and grafts, initial encounter: Secondary | ICD-10-CM | POA: Diagnosis not present

## 2016-05-18 DIAGNOSIS — N2581 Secondary hyperparathyroidism of renal origin: Secondary | ICD-10-CM | POA: Diagnosis not present

## 2016-05-18 DIAGNOSIS — N186 End stage renal disease: Secondary | ICD-10-CM | POA: Diagnosis not present

## 2016-05-18 DIAGNOSIS — D509 Iron deficiency anemia, unspecified: Secondary | ICD-10-CM | POA: Diagnosis not present

## 2016-05-20 DIAGNOSIS — T827XXA Infection and inflammatory reaction due to other cardiac and vascular devices, implants and grafts, initial encounter: Secondary | ICD-10-CM | POA: Diagnosis not present

## 2016-05-20 DIAGNOSIS — D509 Iron deficiency anemia, unspecified: Secondary | ICD-10-CM | POA: Diagnosis not present

## 2016-05-20 DIAGNOSIS — Z992 Dependence on renal dialysis: Secondary | ICD-10-CM | POA: Diagnosis not present

## 2016-05-20 DIAGNOSIS — E1129 Type 2 diabetes mellitus with other diabetic kidney complication: Secondary | ICD-10-CM | POA: Diagnosis not present

## 2016-05-20 DIAGNOSIS — I129 Hypertensive chronic kidney disease with stage 1 through stage 4 chronic kidney disease, or unspecified chronic kidney disease: Secondary | ICD-10-CM | POA: Diagnosis not present

## 2016-05-20 DIAGNOSIS — N186 End stage renal disease: Secondary | ICD-10-CM | POA: Diagnosis not present

## 2016-05-20 DIAGNOSIS — D631 Anemia in chronic kidney disease: Secondary | ICD-10-CM | POA: Diagnosis not present

## 2016-05-20 DIAGNOSIS — N2581 Secondary hyperparathyroidism of renal origin: Secondary | ICD-10-CM | POA: Diagnosis not present

## 2016-05-21 ENCOUNTER — Other Ambulatory Visit: Payer: Self-pay | Admitting: *Deleted

## 2016-05-21 ENCOUNTER — Encounter (HOSPITAL_COMMUNITY): Payer: Self-pay | Admitting: *Deleted

## 2016-05-22 DIAGNOSIS — D631 Anemia in chronic kidney disease: Secondary | ICD-10-CM | POA: Diagnosis not present

## 2016-05-22 DIAGNOSIS — D509 Iron deficiency anemia, unspecified: Secondary | ICD-10-CM | POA: Diagnosis not present

## 2016-05-22 DIAGNOSIS — N186 End stage renal disease: Secondary | ICD-10-CM | POA: Diagnosis not present

## 2016-05-22 DIAGNOSIS — N2581 Secondary hyperparathyroidism of renal origin: Secondary | ICD-10-CM | POA: Diagnosis not present

## 2016-05-22 DIAGNOSIS — E1129 Type 2 diabetes mellitus with other diabetic kidney complication: Secondary | ICD-10-CM | POA: Diagnosis not present

## 2016-05-25 ENCOUNTER — Encounter (HOSPITAL_COMMUNITY)
Admission: RE | Disposition: A | Discharge status: Home / Self Care | Payer: Self-pay | Source: Ambulatory Visit | Attending: Surgery

## 2016-05-25 ENCOUNTER — Ambulatory Visit (HOSPITAL_COMMUNITY): Payer: Medicare Other | Admitting: Anesthesiology

## 2016-05-25 ENCOUNTER — Encounter (HOSPITAL_COMMUNITY): Payer: Self-pay | Admitting: *Deleted

## 2016-05-25 ENCOUNTER — Ambulatory Visit (HOSPITAL_COMMUNITY)
Admission: RE | Admit: 2016-05-25 | Discharge: 2016-05-25 | Disposition: A | Discharge status: Home / Self Care | Payer: Medicare Other | Source: Ambulatory Visit | Attending: Surgery | Admitting: Surgery

## 2016-05-25 DIAGNOSIS — Z79899 Other long term (current) drug therapy: Secondary | ICD-10-CM | POA: Insufficient documentation

## 2016-05-25 DIAGNOSIS — E785 Hyperlipidemia, unspecified: Secondary | ICD-10-CM | POA: Diagnosis not present

## 2016-05-25 DIAGNOSIS — K219 Gastro-esophageal reflux disease without esophagitis: Secondary | ICD-10-CM | POA: Insufficient documentation

## 2016-05-25 DIAGNOSIS — M109 Gout, unspecified: Secondary | ICD-10-CM | POA: Insufficient documentation

## 2016-05-25 DIAGNOSIS — Z7951 Long term (current) use of inhaled steroids: Secondary | ICD-10-CM | POA: Insufficient documentation

## 2016-05-25 DIAGNOSIS — Y832 Surgical operation with anastomosis, bypass or graft as the cause of abnormal reaction of the patient, or of later complication, without mention of misadventure at the time of the procedure: Secondary | ICD-10-CM | POA: Insufficient documentation

## 2016-05-25 DIAGNOSIS — I120 Hypertensive chronic kidney disease with stage 5 chronic kidney disease or end stage renal disease: Secondary | ICD-10-CM | POA: Diagnosis not present

## 2016-05-25 DIAGNOSIS — T82868A Thrombosis of vascular prosthetic devices, implants and grafts, initial encounter: Secondary | ICD-10-CM | POA: Diagnosis not present

## 2016-05-25 DIAGNOSIS — Z992 Dependence on renal dialysis: Secondary | ICD-10-CM | POA: Diagnosis not present

## 2016-05-25 DIAGNOSIS — J45909 Unspecified asthma, uncomplicated: Secondary | ICD-10-CM | POA: Diagnosis not present

## 2016-05-25 DIAGNOSIS — G4730 Sleep apnea, unspecified: Secondary | ICD-10-CM | POA: Diagnosis not present

## 2016-05-25 DIAGNOSIS — N186 End stage renal disease: Secondary | ICD-10-CM | POA: Diagnosis not present

## 2016-05-25 DIAGNOSIS — N185 Chronic kidney disease, stage 5: Secondary | ICD-10-CM | POA: Diagnosis not present

## 2016-05-25 HISTORY — PX: AV FISTULA PLACEMENT: SHX1204

## 2016-05-25 HISTORY — DX: End stage renal disease: N18.6

## 2016-05-25 LAB — GLUCOSE, CAPILLARY: Glucose-Capillary: 110 mg/dL — ABNORMAL HIGH (ref 65–99)

## 2016-05-25 LAB — SURGICAL PCR SCREEN
MRSA, PCR: NEGATIVE
Staphylococcus aureus: NEGATIVE

## 2016-05-25 LAB — POCT I-STAT 4, (NA,K, GLUC, HGB,HCT)
Glucose, Bld: 109 mg/dL — ABNORMAL HIGH (ref 65–99)
HCT: 35 % — ABNORMAL LOW (ref 36.0–46.0)
Hemoglobin: 11.9 g/dL — ABNORMAL LOW (ref 12.0–15.0)
Potassium: 4.2 mmol/L (ref 3.5–5.1)
Sodium: 139 mmol/L (ref 135–145)

## 2016-05-25 SURGERY — INSERTION OF ARTERIOVENOUS (AV) GORE-TEX GRAFT ARM
Anesthesia: General | Site: Arm Upper | Laterality: Left

## 2016-05-25 MED ORDER — LIDOCAINE 2% (20 MG/ML) 5 ML SYRINGE
INTRAMUSCULAR | Status: AC
  Filled 2016-05-25: qty 5

## 2016-05-25 MED ORDER — FENTANYL CITRATE (PF) 250 MCG/5ML IJ SOLN
INTRAMUSCULAR | Status: AC
  Filled 2016-05-25: qty 5

## 2016-05-25 MED ORDER — SODIUM CHLORIDE 0.9 % IV SOLN
INTRAVENOUS | Status: DC
  Administered 2016-05-25 (×2): via INTRAVENOUS

## 2016-05-25 MED ORDER — OXYCODONE HCL 5 MG/5ML PO SOLN: 5.0000 mg | Freq: Once | ORAL | Status: DC | PRN

## 2016-05-25 MED ORDER — PROPOFOL 10 MG/ML IV BOLUS
INTRAVENOUS | Status: AC
  Filled 2016-05-25: qty 20

## 2016-05-25 MED ORDER — PROPOFOL 10 MG/ML IV BOLUS
INTRAVENOUS | Status: DC | PRN
  Administered 2016-05-25: 40 mg via INTRAVENOUS
  Administered 2016-05-25: 160 mg via INTRAVENOUS

## 2016-05-25 MED ORDER — ONDANSETRON HCL 4 MG/2ML IJ SOLN
INTRAMUSCULAR | Status: AC
  Filled 2016-05-25: qty 2

## 2016-05-25 MED ORDER — PROTAMINE SULFATE 10 MG/ML IV SOLN
INTRAVENOUS | Status: DC | PRN
  Administered 2016-05-25 (×2): 10 mg via INTRAVENOUS
  Administered 2016-05-25: 20 mg via INTRAVENOUS
  Administered 2016-05-25: 10 mg via INTRAVENOUS

## 2016-05-25 MED ORDER — VANCOMYCIN HCL IN DEXTROSE 1-5 GM/200ML-% IV SOLN
1000.0000 mg | INTRAVENOUS | Status: AC
Start: 2016-05-25 — End: 2016-05-25
  Administered 2016-05-25: 1000 mg via INTRAVENOUS
  Filled 2016-05-25: qty 200

## 2016-05-25 MED ORDER — MUPIROCIN 2 % EX OINT
1.0000 "application " | TOPICAL_OINTMENT | Freq: Once | CUTANEOUS | Status: AC
  Administered 2016-05-25: 1 via TOPICAL
  Filled 2016-05-25: qty 22

## 2016-05-25 MED ORDER — FENTANYL CITRATE (PF) 100 MCG/2ML IJ SOLN
INTRAMUSCULAR | Status: DC | PRN
  Administered 2016-05-25: 25 ug via INTRAVENOUS
  Administered 2016-05-25: 50 ug via INTRAVENOUS
  Administered 2016-05-25: 25 ug via INTRAVENOUS
  Administered 2016-05-25: 100 ug via INTRAVENOUS
  Administered 2016-05-25: 50 ug via INTRAVENOUS

## 2016-05-25 MED ORDER — MIDAZOLAM HCL 2 MG/2ML IJ SOLN
INTRAMUSCULAR | Status: AC
  Filled 2016-05-25: qty 2

## 2016-05-25 MED ORDER — LIDOCAINE HCL (CARDIAC) 20 MG/ML IV SOLN
INTRAVENOUS | Status: DC | PRN
  Administered 2016-05-25: 60 mg via INTRAVENOUS

## 2016-05-25 MED ORDER — MIDAZOLAM HCL 5 MG/5ML IJ SOLN
INTRAMUSCULAR | Status: DC | PRN
  Administered 2016-05-25: 2 mg via INTRAVENOUS

## 2016-05-25 MED ORDER — FENTANYL CITRATE (PF) 100 MCG/2ML IJ SOLN: 25.0000 ug | INTRAMUSCULAR | Status: DC | PRN

## 2016-05-25 MED ORDER — PROTAMINE SULFATE 10 MG/ML IV SOLN
INTRAVENOUS | Status: AC
  Filled 2016-05-25: qty 5

## 2016-05-25 MED ORDER — ONDANSETRON HCL 4 MG/2ML IJ SOLN
INTRAMUSCULAR | Status: DC | PRN
  Administered 2016-05-25: 4 mg via INTRAVENOUS

## 2016-05-25 MED ORDER — HEPARIN SODIUM (PORCINE) 5000 UNIT/ML IJ SOLN
INTRAMUSCULAR | Status: DC | PRN
  Administered 2016-05-25: 08:00:00

## 2016-05-25 MED ORDER — HEPARIN SODIUM (PORCINE) 1000 UNIT/ML IJ SOLN
INTRAMUSCULAR | Status: AC
  Filled 2016-05-25: qty 1

## 2016-05-25 MED ORDER — FENTANYL CITRATE (PF) 100 MCG/2ML IJ SOLN
25.0000 ug | INTRAMUSCULAR | Status: DC | PRN
  Administered 2016-05-25: 25 ug via INTRAVENOUS

## 2016-05-25 MED ORDER — FENTANYL CITRATE (PF) 100 MCG/2ML IJ SOLN
INTRAMUSCULAR | Status: AC
  Filled 2016-05-25: qty 2

## 2016-05-25 MED ORDER — OXYCODONE HCL 5 MG PO TABS: 5.0000 mg | ORAL_TABLET | Freq: Once | ORAL | Status: DC | PRN

## 2016-05-25 MED ORDER — OXYCODONE-ACETAMINOPHEN 5-325 MG PO TABS: 1.0000 | ORAL_TABLET | ORAL | 0 refills | Status: DC | PRN

## 2016-05-25 MED ORDER — FENTANYL CITRATE (PF) 250 MCG/5ML IJ SOLN
INTRAMUSCULAR | Status: AC
  Filled 2016-05-25: qty 10

## 2016-05-25 MED ORDER — 0.9 % SODIUM CHLORIDE (POUR BTL) OPTIME
TOPICAL | Status: DC | PRN
  Administered 2016-05-25: 1000 mL

## 2016-05-25 MED ORDER — CHLORHEXIDINE GLUCONATE CLOTH 2 % EX PADS: 6.0000 | MEDICATED_PAD | Freq: Once | CUTANEOUS | Status: DC

## 2016-05-25 MED ORDER — HEPARIN SODIUM (PORCINE) 1000 UNIT/ML IJ SOLN
INTRAMUSCULAR | Status: DC | PRN
  Administered 2016-05-25: 5000 [IU] via INTRAVENOUS

## 2016-05-25 SURGICAL SUPPLY — 27 items
ARMBAND PINK RESTRICT EXTREMIT (MISCELLANEOUS) ×4 IMPLANT
CANISTER SUCT 3000ML PPV (MISCELLANEOUS) ×2 IMPLANT
CANNULA VESSEL 3MM 2 BLNT TIP (CANNULA) IMPLANT
CLIP TI MEDIUM 6 (CLIP) ×2 IMPLANT
CLIP TI WIDE RED SMALL 6 (CLIP) ×2 IMPLANT
DECANTER SPIKE VIAL GLASS SM (MISCELLANEOUS) ×2 IMPLANT
DERMABOND ADVANCED (GAUZE/BANDAGES/DRESSINGS) ×1
DERMABOND ADVANCED .7 DNX12 (GAUZE/BANDAGES/DRESSINGS) ×1 IMPLANT
ELECT REM PT RETURN 9FT ADLT (ELECTROSURGICAL) ×2
ELECTRODE REM PT RTRN 9FT ADLT (ELECTROSURGICAL) ×1 IMPLANT
GLOVE BIO SURGEON STRL SZ7.5 (GLOVE) ×2 IMPLANT
GOWN STRL REUS W/ TWL LRG LVL3 (GOWN DISPOSABLE) ×3 IMPLANT
GOWN STRL REUS W/TWL LRG LVL3 (GOWN DISPOSABLE) ×3
GRAFT GORETEX STRT 4-7X45 (Vascular Products) ×2 IMPLANT
HEMOSTAT SPONGE AVITENE ULTRA (HEMOSTASIS) IMPLANT
KIT BASIN OR (CUSTOM PROCEDURE TRAY) ×2 IMPLANT
KIT ROOM TURNOVER OR (KITS) ×2 IMPLANT
NS IRRIG 1000ML POUR BTL (IV SOLUTION) ×2 IMPLANT
PACK CV ACCESS (CUSTOM PROCEDURE TRAY) ×2 IMPLANT
PAD ARMBOARD 7.5X6 YLW CONV (MISCELLANEOUS) ×4 IMPLANT
SUT PROLENE 6 0 CC (SUTURE) ×10 IMPLANT
SUT PROLENE 7 0 BV 1 (SUTURE) ×2 IMPLANT
SUT VIC AB 3-0 SH 27 (SUTURE) ×2
SUT VIC AB 3-0 SH 27X BRD (SUTURE) ×2 IMPLANT
SUT VICRYL 4-0 PS2 18IN ABS (SUTURE) ×4 IMPLANT
UNDERPAD 30X30 (UNDERPADS AND DIAPERS) ×2 IMPLANT
WATER STERILE IRR 1000ML POUR (IV SOLUTION) ×2 IMPLANT

## 2016-05-25 NOTE — Anesthesia Preprocedure Evaluation (Addendum)
Anesthesia Evaluation  Patient identified by MRN, date of birth, ID band Patient awake    Reviewed: Allergy & Precautions, NPO status , Patient's Chart, lab work & pertinent test results  History of Anesthesia Complications Negative for: history of anesthetic complications  Airway Mallampati: II  TM Distance: >3 FB Neck ROM: Full    Dental  (+) Poor Dentition, Dental Advisory Given, Missing, Chipped   Pulmonary asthma , sleep apnea and Continuous Positive Airway Pressure Ventilation ,  Patient uses CPAP intermittently   breath sounds clear to auscultation       Cardiovascular hypertension, Pt. on medications + Valvular Problems/Murmurs  Rhythm:Regular Rate:Normal     Neuro/Psych    GI/Hepatic GERD  Medicated and Controlled,  Endo/Other  Hypothyroidism   Renal/GU ESRF and DialysisRenal diseaseLast HD Fri 3/2     Musculoskeletal  (+) Arthritis ,   Abdominal   Peds  Hematology  (+) anemia ,   Anesthesia Other Findings   Reproductive/Obstetrics                            Anesthesia Physical Anesthesia Plan  ASA: III  Anesthesia Plan: General   Post-op Pain Management:    Induction: Intravenous  Airway Management Planned: LMA  Additional Equipment: None  Intra-op Plan:   Post-operative Plan: Extubation in OR  Informed Consent: I have reviewed the patients History and Physical, chart, labs and discussed the procedure including the risks, benefits and alternatives for the proposed anesthesia with the patient or authorized representative who has indicated his/her understanding and acceptance.   Dental advisory given  Plan Discussed with: CRNA, Anesthesiologist and Surgeon  Anesthesia Plan Comments:        Anesthesia Quick Evaluation

## 2016-05-25 NOTE — Anesthesia Procedure Notes (Signed)
Procedure Name: LMA Insertion Date/Time: 05/25/2016 7:44 AM Performed by: Luciana Axe K Pre-anesthesia Checklist: Patient identified, Emergency Drugs available, Suction available and Patient being monitored Patient Re-evaluated:Patient Re-evaluated prior to inductionOxygen Delivery Method: Circle System Utilized Preoxygenation: Pre-oxygenation with 100% oxygen Intubation Type: IV induction Ventilation: Mask ventilation without difficulty LMA: LMA inserted LMA Size: 4.0 Number of attempts: 1 Airway Equipment and Method: Bite block Placement Confirmation: positive ETCO2 and breath sounds checked- equal and bilateral Tube secured with: Tape Dental Injury: Teeth and Oropharynx as per pre-operative assessment

## 2016-05-25 NOTE — Op Note (Signed)
Procedure: #1 Left upper arm loop graft 4-7  Preoperative diagnosis: End-stage renal disease  Postoperative diagnosis: Same  Anesthesia: Gen.  Assistant: Gerri Lins PA  Operative findings: #1  4-7 mm tapered PTFE graft end to side brachial artery, end to side axillary vein  Operative details: After obtaining informed consent, the patient was taken to the operating room. The patient was placed in supine position on the operating table. After induction of general anesthesia and placement of a laryngeal mask, Attention was then turned to the patient's left upper extremity. The entire left upper extremity was prepped and draped in usual sterile fashion. Next a longitudinal incision was made in the left axilla carried onto the subcutaneous tissues down to level of the axillary vein and brachial artery.  The brachial artery was dissected free circumferentially and small side branches ligated and divided between silk ties. The axillary vein was dissected free circumferentially. There was a large branch posteriorly. This was not divided as it would compromise lumen of the vein. Therefore it was decided to do an end to side anastomosis to the vein. Next a subcutaneous tunnel was created in a loop configuration with a transverse incision in the apex of the loop for assistance in tunneling. A 4-7 mm tapered PTFE graft was brought through this subcutaneous tunnel. 4 mm end of the graft was out over the biceps the 7 mm end of the graft was on the more medial aspect of the upper arm. The patient was given 5000 units of intravenous heparin. After 2 minutes of circulation time, the artery was controlled proximally and distally with Vesseloops. A longitudinal opening was made in the brachial artery the 4 mm end of graft was beveled and sewn end of graft to side of artery using running 6-0 Prolene suture.  This was fairly challenging due to the depth and having to move adjacent nerves.   Just prior to completion of  the anastomosis, everything was forebled backbled and thoroughly flushed. The anastomosis was secured and the Vesseloops released. There was pulsatile flow in the graft immediately. The graft was then pulled taut to length. The vein was controlled proximally and distally with fine bulldog clamps. A large posterior branch was also controlled with a fine bulldog clamp. A longitudinal opening was made in the vein. There was a valve at this location and this was lysed under direct vision. The graft was pulled taut to length and beveled and sewn end of graft to side of vein using a running 6-0 Prolene suture. Just prior to completion of the anastomosis everything was forebled backbled and thoroughly flushed and the anastomosis was secured.  The clamps were released and there were a palpable thrill in the graft immediately. Hemostasis was obtained with direct pressure and the assistance of 50 mg of protamine. The subcutaneous tissues of both incisions were reapproximated using a running 3-0 Vicryl suture.  The skin of both incisions was closed with a 4 0 Vicryl subcuticular stitch. Dermabond was applied to both incisions. The patient tolerated procedure well and there were no complications. Instrument sponge and needle counts were correct at the end of the case. The patient was taken to the recovery room in stable condition.  Ruta Hinds, MD [facility] Office: [phone] Pager: [phone]

## 2016-05-25 NOTE — Interval H&P Note (Signed)
History and Physical Interval Note:  05/25/2016 7:28 AM  Rachael George  has presented today for surgery, with the diagnosis of End Stage Renal Disease N18.6  The various methods of treatment have been discussed with the patient and family. After consideration of risks, benefits and other options for treatment, the patient has consented to  Procedure(s): INSERTION OF LEFT UPPER ARM ARTERIOVENOUS (AV) LOOP GORE-TEX GRAFT ARM (Left) as a surgical intervention .  The patient's history has been reviewed, patient examined, no change in status, stable for surgery.  I have reviewed the patient's chart and labs.  Questions were answered to the patient's satisfaction.     Ruta Hinds

## 2016-05-25 NOTE — H&P (View-Only) (Signed)
Postoperative Access Visit   History of Present Illness  Rachael George is a 61 y.o. year old female who presents for postoperative follow-up for: RIJV TDC, L 1st BVT (Date: 04/22/16).  The patient's wounds are healed.  The patient notes no steal symptoms.  The patient is able to complete their activities of daily living.  The patient's current symptoms are: none.  This patient's fistula clotted after discharge from the hospital.  Past Medical History:  Diagnosis Date  . Anemia   . Asthma   . GERD (gastroesophageal reflux disease)   . Gout   . Heart murmur   . Heavy menstrual bleeding   . High cholesterol   . History of blood transfusion 02/2016- last one   1990's- several ones   . History of kidney stones 1980's  . Hyperlipemia 12/07/2012  . Hypertension   . Hypothyroidism   . Kidney disease   . Kidney stones   . Pre-diabetes   . Prediabetes   . RA (rheumatoid arthritis) ([place])   . Sleep apnea    [facility] [place]- have CPAP, not able to use every night     Past Surgical History:  Procedure Laterality Date  . ABDOMINAL HYSTERECTOMY  2000  . BASCILIC VEIN TRANSPOSITION Right 12/06/2014   Procedure: FIRST STAGE BASILIC VEIN TRANSPOSITION - RIGHT;  Surgeon: Serafina Mitchell, MD;  Location: [facility];  Service: Vascular;  Laterality: Right;  . BASCILIC VEIN TRANSPOSITION Right 02/07/2015   Procedure: RIGHT ARM SECOND STAGE BASILIC VEIN TRANSPOSITION;  Surgeon: Serafina Mitchell, MD;  Location: [facility];  Service: Vascular;  Laterality: Right;  . BASCILIC VEIN TRANSPOSITION Left 04/22/2016   Procedure: FIRST STAGE BASILIC VEIN TRANSPOSITION LEFT ARM;  Surgeon: Conrad Lytle, MD;  Location: [facility];  Service: Vascular;  Laterality: Left;  . ECTOPIC PREGNANCY SURGERY  02/1982  . EXCHANGE OF A DIALYSIS CATHETER Right 04/22/2016   Procedure: EXCHANGE OF A DIALYSIS CATHETER - INSERTION RIGHT INTERNAL JUGULAR & REMOVAL FROM LEFT INTERNAL JUGULAR;  Surgeon: Conrad Beverly Beach, MD;  Location: [facility];  Service:  Vascular;  Laterality: Right;  . I&D EXTREMITY Right 02/07/2015   Procedure: IRRIGATION AND DEBRIDEMENT RIGHT ARM HEMATOMA;  Surgeon: Serafina Mitchell, MD;  Location: [facility];  Service: Vascular;  Laterality: Right;  . INSERTION OF DIALYSIS CATHETER  03/03/2016   Procedure: INSERTION OF DIALYSIS CATHETER;  Surgeon: Elam Dutch, MD;  Location: [facility];  Service: Vascular;;  . left shoulder surgery  08/2005  . LIGATION OF ARTERIOVENOUS  FISTULA  03/03/2016   Procedure: LIGATION OF ARTERIOVENOUS  FISTULA;  Surgeon: Elam Dutch, MD;  Location: [facility];  Service: Vascular;;  . TEE WITHOUT CARDIOVERSION N/A 03/06/2016   Procedure: TRANSESOPHAGEAL ECHOCARDIOGRAM (TEE);  Surgeon: Sueanne Margarita, MD;  Location: [facility];  Service: Cardiovascular;  Laterality: N/A;  . TUBAL LIGATION  11/1986    Social History   Social History  . Marital status: Legally Separated    Spouse name: N/A  . Number of children: 3  . Years of education: N/A   Occupational History  . Not on file.   Social History Main Topics  . Smoking status: Never Smoker  . Smokeless tobacco: Never Used  . Alcohol use 0.0 oz/week     Comment: occ - 1 drink rarely  . Drug use: No  . Sexual activity: Not on file   Other Topics Concern  . 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 Hypertension Mother   . Breast cancer Maternal Aunt   . Deep vein thrombosis Daughter   . Hyperlipidemia Daughter   . Hypertension Daughter   . Prostate cancer Maternal Grandmother     Current Outpatient Prescriptions  Medication Sig Dispense Refill  . acetaminophen (TYLENOL) 500 MG tablet Take 500 mg by mouth every 6 (six) hours as needed.    Marland Kitchen albuterol (PROVENTIL  HFA;VENTOLIN HFA) 108 (90 Base) MCG/ACT inhaler Inhale 2 puffs into the lungs every 6 (six) hours as needed for wheezing or shortness of breath. 1 Inhaler 2  . allopurinol (ZYLOPRIM) 100 MG tablet Take 100 mg by mouth 2 (two) times daily. Reported on 09/02/2015    . amLODipine (NORVASC) 10 MG tablet Take 10 mg by mouth daily.    . B Complex-C-Folic Acid (DIALYVITE TABLET) TABS Take 1 tablet by mouth daily.    . cinacalcet (SENSIPAR) 30 MG tablet Take 60 mg by mouth every other day.     . colchicine 0.6 MG tablet Take 2 tabs (1.2mg ) at the onset of a gout flare, may repeat 1 tab (0.6mg ) if symptoms persist (Patient not taking: Reported on 05/05/2016) 30 tablet 2  . fluticasone (FLOVENT HFA) 110 MCG/ACT inhaler Inhale 1 puff into the lungs 2 (two) times daily. 1 Inhaler 12  . pantoprazole (PROTONIX) 40 MG tablet Take 40 mg by mouth 2 (two) times daily.    . promethazine (PHENERGAN) 25 MG tablet Take 25 mg by mouth daily as needed for nausea or vomiting.     . sevelamer (RENAGEL) 800 MG tablet Take 1,600-3,200 mg by mouth See admin instructions. 3,200 mg three times a day with meals and 1,600 mg twice a day with snacks     No current facility-administered medications for this visit.      Allergies  Allergen Reactions  . Atacand [Candesartan] Anaphylaxis  . Lisinopril Anaphylaxis  . Motrin [Ibuprofen] Anaphylaxis  . Nsaids Anaphylaxis  . Tolmetin Anaphylaxis  . Penicillins Rash    Has patient had a PCN reaction causing immediate rash, facial/tongue/throat swelling, SOB or lightheadedness with hypotension: No Has patient had a PCN reaction causing severe rash involving mucus membranes or skin necrosis: No Has patient had a PCN reaction that required hospitalization No Has patient had a PCN reaction occurring within the last 10 years: No If all of the above answers are "NO", then may proceed with Cephalosporin use.      REVIEW OF SYSTEMS:  (Positives checked otherwise  negative)  CARDIOVASCULAR:   [ ]  chest pain,  [ ]  chest pressure,  [ ]  palpitations,  [ ]  shortness of breath when laying flat,  [ ]  shortness of breath with exertion,   [ ]  pain in feet when walking,  [ ]  pain in feet when laying flat, [ ]  history of blood clot in veins (DVT),  [ ]  history of phlebitis,  [ ]  swelling in legs,  [ ]  varicose veins  PULMONARY:   [ ]  productive cough,  [ ]  asthma,  [ ]  wheezing  NEUROLOGIC:   [ ]  weakness in arms or legs,  [ ]   numbness in arms or legs,  [ ]  difficulty speaking or slurred speech,  [ ]  temporary loss of vision in one eye,  [ ]  dizziness  HEMATOLOGIC:   [ ]  bleeding problems,  [ ]  problems with blood clotting too easily  MUSCULOSKEL:   [ ]  joint pain, [ ]  joint swelling  GASTROINTEST:   [ ]  vomiting blood,  [ ]  blood in stool     GENITOURINARY:   [ ]  burning with urination,  [ ]  blood in urine [x]  end stage renal disease-HD: M/W/F   PSYCHIATRIC:   [ ]  history of major depression  INTEGUMENTARY:   [ ]  rashes,  [ ]  ulcers  CONSTITUTIONAL:   [ ]  fever,  [ ]  chills   Physical Examination Vitals:   05/11/16 1348  BP: (!) 154/103  Pulse: (!) 104  Resp: 16  Temp: 98.8 F (37.1 C)    Pulmonary: Sym exp, good air movt, CTAB, no rales, rhonchi, & wheezing  Cardiac: RRR, Nl S1, S2, no Murmurs, rubs or gallops  LUE: Incision is healed, skin feels warm, hand grip is 5/5, sensation in digits is  intact, no palpable thrill, bruit cannot  be auscultated    Medical Decision Making  Rachael George is a 61 y.o. year old female who presents s/p failed L staged BVT.   Next access option would be a looped upper arm AVG given the high bifurcation in this patient, which is the likely cause of failure in this patient. Risk, benefits, and alternatives to access surgery were discussed.   The patient is aware the risks include but are not limited to: bleeding, infection, steal syndrome, nerve damage, ischemic monomelic  neuropathy, thrombosis, failure to mature, need for additional procedures, death and stroke.   The patient agrees to proceed forward with the procedure.  She is scheduled for: 2 MAR 18 with Dr. Trula Slade.  Thank you for allowing [place] to participate in this patient's care.  Adele Barthel, MD, FACS [facility] Office: [phone] Pager: [phone]

## 2016-05-25 NOTE — Transfer of Care (Signed)
Immediate Anesthesia Transfer of Care Note  Patient: Rachael George  Procedure(s) Performed: Procedure(s): INSERTION OF LEFT UPPER ARM ARTERIOVENOUS (AV) LOOP GORE-TEX GRAFT ARM (Left)  Patient Location: PACU  Anesthesia Type:General  Level of Consciousness: awake, oriented and patient cooperative  Airway & Oxygen Therapy: Patient Spontanous Breathing and Patient connected to face mask oxygen  Post-op Assessment: Report given to RN and Post -op Vital signs reviewed and stable  Post vital signs: Reviewed and stable  Last Vitals:  Vitals:   05/25/16 0557 05/25/16 0638  BP: (!) 195/114 (!) 175/102  Pulse: 88   Resp: 18   Temp: 36.8 C     Last Pain: There were no vitals filed for this visit.    Patients Stated Pain Goal: 2 (44/01/02 7253)  Complications: No apparent anesthesia complications

## 2016-05-26 ENCOUNTER — Ambulatory Visit: Payer: Medicare Other | Admitting: Sports Medicine

## 2016-05-26 ENCOUNTER — Encounter (HOSPITAL_COMMUNITY): Payer: Self-pay | Admitting: Vascular Surgery

## 2016-05-26 NOTE — Anesthesia Postprocedure Evaluation (Addendum)
Anesthesia Post Note  Patient: Rachael George  Procedure(s) Performed: Procedure(s) (LRB): INSERTION OF LEFT UPPER ARM ARTERIOVENOUS (AV) LOOP GORE-TEX GRAFT ARM (Left)  Patient location during evaluation: PACU Anesthesia Type: General Level of consciousness: awake and alert Pain management: pain level controlled Vital Signs Assessment: post-procedure vital signs reviewed and stable Respiratory status: spontaneous breathing, nonlabored ventilation, respiratory function stable and patient connected to nasal cannula oxygen Cardiovascular status: blood pressure returned to baseline and stable Postop Assessment: no signs of nausea or vomiting Anesthetic complications: no       Last Vitals:  Vitals:   05/25/16 1115 05/25/16 1132  BP:  (!) 148/97  Pulse:  96  Resp:  16  Temp: 36.4 C     Last Pain:  Vitals:   05/25/16 1132  PainSc: 0-No pain                 Seren Chaloux

## 2016-05-27 DIAGNOSIS — D509 Iron deficiency anemia, unspecified: Secondary | ICD-10-CM | POA: Diagnosis not present

## 2016-05-27 DIAGNOSIS — N186 End stage renal disease: Secondary | ICD-10-CM | POA: Diagnosis not present

## 2016-05-27 DIAGNOSIS — D631 Anemia in chronic kidney disease: Secondary | ICD-10-CM | POA: Diagnosis not present

## 2016-05-27 DIAGNOSIS — N2581 Secondary hyperparathyroidism of renal origin: Secondary | ICD-10-CM | POA: Diagnosis not present

## 2016-05-27 DIAGNOSIS — E1129 Type 2 diabetes mellitus with other diabetic kidney complication: Secondary | ICD-10-CM | POA: Diagnosis not present

## 2016-05-29 DIAGNOSIS — E1129 Type 2 diabetes mellitus with other diabetic kidney complication: Secondary | ICD-10-CM | POA: Diagnosis not present

## 2016-05-29 DIAGNOSIS — D509 Iron deficiency anemia, unspecified: Secondary | ICD-10-CM | POA: Diagnosis not present

## 2016-05-29 DIAGNOSIS — N2581 Secondary hyperparathyroidism of renal origin: Secondary | ICD-10-CM | POA: Diagnosis not present

## 2016-05-29 DIAGNOSIS — N186 End stage renal disease: Secondary | ICD-10-CM | POA: Diagnosis not present

## 2016-05-29 DIAGNOSIS — D631 Anemia in chronic kidney disease: Secondary | ICD-10-CM | POA: Diagnosis not present

## 2016-06-01 DIAGNOSIS — D631 Anemia in chronic kidney disease: Secondary | ICD-10-CM | POA: Diagnosis not present

## 2016-06-01 DIAGNOSIS — D509 Iron deficiency anemia, unspecified: Secondary | ICD-10-CM | POA: Diagnosis not present

## 2016-06-01 DIAGNOSIS — N2581 Secondary hyperparathyroidism of renal origin: Secondary | ICD-10-CM | POA: Diagnosis not present

## 2016-06-01 DIAGNOSIS — E1129 Type 2 diabetes mellitus with other diabetic kidney complication: Secondary | ICD-10-CM | POA: Diagnosis not present

## 2016-06-01 DIAGNOSIS — N186 End stage renal disease: Secondary | ICD-10-CM | POA: Diagnosis not present

## 2016-06-03 ENCOUNTER — Telehealth: Payer: Self-pay

## 2016-06-03 ENCOUNTER — Encounter: Payer: Self-pay | Admitting: Vascular Surgery

## 2016-06-03 DIAGNOSIS — D509 Iron deficiency anemia, unspecified: Secondary | ICD-10-CM | POA: Diagnosis not present

## 2016-06-03 DIAGNOSIS — D631 Anemia in chronic kidney disease: Secondary | ICD-10-CM | POA: Diagnosis not present

## 2016-06-03 DIAGNOSIS — N186 End stage renal disease: Secondary | ICD-10-CM | POA: Diagnosis not present

## 2016-06-03 DIAGNOSIS — N2581 Secondary hyperparathyroidism of renal origin: Secondary | ICD-10-CM | POA: Diagnosis not present

## 2016-06-03 DIAGNOSIS — E1129 Type 2 diabetes mellitus with other diabetic kidney complication: Secondary | ICD-10-CM | POA: Diagnosis not present

## 2016-06-03 NOTE — Telephone Encounter (Signed)
rec'd phone call from [place], [place] with [facility].  Reported pt. C/o irritation of stitches @ site of prev. Ligation of right arm AVF.  Stated the stitches are visible. Asking for appt. To evaluate.  Per Dr. Oneida Alar, may place on PA office schedule.  Appt. given for 1:30 PM, 06/04/16.  Katie, PA, will give appt. To pt.

## 2016-06-04 ENCOUNTER — Ambulatory Visit (INDEPENDENT_AMBULATORY_CARE_PROVIDER_SITE_OTHER): Payer: Self-pay | Admitting: Vascular Surgery

## 2016-06-04 ENCOUNTER — Ambulatory Visit: Payer: Medicare Other

## 2016-06-04 VITALS — BP 159/108 | HR 86 | Temp 99.0°F | Resp 16 | Ht 62.0 in | Wt 174.0 lb

## 2016-06-04 DIAGNOSIS — Z992 Dependence on renal dialysis: Secondary | ICD-10-CM

## 2016-06-04 DIAGNOSIS — N186 End stage renal disease: Secondary | ICD-10-CM

## 2016-06-04 NOTE — Progress Notes (Signed)
Patient name: ELEXIA George MRN: [id number] DOB: 09-Feb-1956 Sex: female  REASON FOR VISIT: add-on   HPI: Rachael George is a 61 y.o. female who presents due to irritatiion with incision from ligation of right upper arm fistula back on 03/03/16. She denies any drainage, fever or chills. She recently underwent left upper arm loop graft on 05/25/16 by Dr. Oneida Alar. She is currently dialyzing via a right IJ TDC. She denies any pain or numbness in her left hand.   Current Outpatient Prescriptions  Medication Sig Dispense Refill  . acetaminophen (TYLENOL) 500 MG tablet Take 1,000 mg by mouth every 6 (six) hours as needed for mild pain.     Marland Kitchen albuterol (PROVENTIL HFA;VENTOLIN HFA) 108 (90 Base) MCG/ACT inhaler Inhale 2 puffs into the lungs every 6 (six) hours as needed for wheezing or shortness of breath. 1 Inhaler 2  . allopurinol (ZYLOPRIM) 100 MG tablet Take 100 mg by mouth 2 (two) times daily. Reported on 09/02/2015    . amLODipine (NORVASC) 10 MG tablet Take 10 mg by mouth at bedtime.     Marland Kitchen b complex-vitamin c-folic acid (NEPHRO-VITE) 0.8 MG TABS tablet Take 1 tablet by mouth daily.    . cinacalcet (SENSIPAR) 60 MG tablet Take 60 mg by mouth every other day.    . colchicine 0.6 MG tablet Take 2 tabs (1.2mg ) at the onset of a gout flare, may repeat 1 tab (0.6mg ) if symptoms persist (Patient taking differently: Take 0.6-1.2 mg by mouth See admin instructions. Take 2 tabs (1.2mg ) at the onset of a gout flare, may repeat 1 tab (0.6mg ) if symptoms persist) 30 tablet 2  . famotidine (PEPCID) 20 MG tablet Take 20 mg by mouth 2 (two) times daily.    . fluticasone (FLOVENT HFA) 110 MCG/ACT inhaler Inhale 1 puff into the lungs 2 (two) times daily. 1 Inhaler 12  . pantoprazole (PROTONIX) 40 MG tablet Take 40 mg by mouth 2 (two) times daily.    . promethazine (PHENERGAN) 25 MG tablet Take 25 mg by mouth every 6 (six) hours as needed for nausea or vomiting.     . sevelamer (RENAGEL) 800 MG tablet Take  1,600-3,200 mg by mouth See admin instructions. 3,200 mg three times a day with meals and 1,600 mg twice a day with snacks    . oxyCODONE-acetaminophen (PERCOCET/ROXICET) 5-325 MG tablet Take 1 tablet by mouth every 4 (four) hours as needed. (Patient not taking: Reported on 06/04/2016) 15 tablet 0   No current facility-administered medications for this visit.     REVIEW OF SYSTEMS:  [X]  denotes positive finding, [ ]  denotes negative finding Cardiac  Comments:  Chest pain or chest pressure:    Shortness of breath upon exertion:    Short of breath when lying flat:    Irregular heart rhythm:    Constitutional    Fever or chills:      PHYSICAL EXAM: Vitals:   06/04/16 1455 06/04/16 1458  BP: (!) 174/107 (!) 159/108  Pulse: 86 86  Resp: 16   Temp: 99 F (37.2 C)   SpO2: 100%   Weight: 174 lb (78.9 kg)   Height: 5\' 2"  (1.575 m)     GENERAL: The patient is a well-nourished female, in no acute distress. The vital signs are documented above. VASCULAR: Suture protruding at distal end of incision. This was cut. Minimal amount of purulent drainage from suture reaction. No erythema or fluctuance. Left upper arm graft patent with palpable thrill. Incisions healing well.  Palpable left radial pulse.   MEDICAL ISSUES: ESRD on HD Exposed stitch right upper arm  There was a suture knot protruding from her right upper arm incision following ligation of her right arm AV fistula in December 2017. The knot was cut. The incision is otherwise well healed. Her left upper arm loop graft was placed on 05/25/16. This graft is patent and she has no steal symptoms in her left hand. Ok to cannulate on 06/25/16. Currently dialyzing via right IJ TDC. Follow-up prn.   Virgina Jock, PA-C [facility] MD: Oneida Alar

## 2016-06-04 NOTE — Progress Notes (Signed)
Vitals:   06/04/16 1455  BP: (!) 174/107  Pulse: 86  Resp: 16  Temp: 99 F (37.2 C)  SpO2: 100%  Weight: 174 lb (78.9 kg)  Height: 5\' 2"  (1.575 m)

## 2016-06-05 ENCOUNTER — Encounter: Payer: Medicare Other | Admitting: Vascular Surgery

## 2016-06-05 DIAGNOSIS — D631 Anemia in chronic kidney disease: Secondary | ICD-10-CM | POA: Diagnosis not present

## 2016-06-05 DIAGNOSIS — N186 End stage renal disease: Secondary | ICD-10-CM | POA: Diagnosis not present

## 2016-06-05 DIAGNOSIS — N2581 Secondary hyperparathyroidism of renal origin: Secondary | ICD-10-CM | POA: Diagnosis not present

## 2016-06-05 DIAGNOSIS — E1129 Type 2 diabetes mellitus with other diabetic kidney complication: Secondary | ICD-10-CM | POA: Diagnosis not present

## 2016-06-05 DIAGNOSIS — D509 Iron deficiency anemia, unspecified: Secondary | ICD-10-CM | POA: Diagnosis not present

## 2016-06-08 DIAGNOSIS — N2581 Secondary hyperparathyroidism of renal origin: Secondary | ICD-10-CM | POA: Diagnosis not present

## 2016-06-08 DIAGNOSIS — N186 End stage renal disease: Secondary | ICD-10-CM | POA: Diagnosis not present

## 2016-06-08 DIAGNOSIS — D631 Anemia in chronic kidney disease: Secondary | ICD-10-CM | POA: Diagnosis not present

## 2016-06-08 DIAGNOSIS — D509 Iron deficiency anemia, unspecified: Secondary | ICD-10-CM | POA: Diagnosis not present

## 2016-06-08 DIAGNOSIS — E1129 Type 2 diabetes mellitus with other diabetic kidney complication: Secondary | ICD-10-CM | POA: Diagnosis not present

## 2016-06-10 DIAGNOSIS — E1129 Type 2 diabetes mellitus with other diabetic kidney complication: Secondary | ICD-10-CM | POA: Diagnosis not present

## 2016-06-10 DIAGNOSIS — D631 Anemia in chronic kidney disease: Secondary | ICD-10-CM | POA: Diagnosis not present

## 2016-06-10 DIAGNOSIS — N186 End stage renal disease: Secondary | ICD-10-CM | POA: Diagnosis not present

## 2016-06-10 DIAGNOSIS — D509 Iron deficiency anemia, unspecified: Secondary | ICD-10-CM | POA: Diagnosis not present

## 2016-06-10 DIAGNOSIS — N2581 Secondary hyperparathyroidism of renal origin: Secondary | ICD-10-CM | POA: Diagnosis not present

## 2016-06-12 DIAGNOSIS — D509 Iron deficiency anemia, unspecified: Secondary | ICD-10-CM | POA: Diagnosis not present

## 2016-06-12 DIAGNOSIS — D631 Anemia in chronic kidney disease: Secondary | ICD-10-CM | POA: Diagnosis not present

## 2016-06-12 DIAGNOSIS — N186 End stage renal disease: Secondary | ICD-10-CM | POA: Diagnosis not present

## 2016-06-12 DIAGNOSIS — E1129 Type 2 diabetes mellitus with other diabetic kidney complication: Secondary | ICD-10-CM | POA: Diagnosis not present

## 2016-06-12 DIAGNOSIS — N2581 Secondary hyperparathyroidism of renal origin: Secondary | ICD-10-CM | POA: Diagnosis not present

## 2016-06-15 DIAGNOSIS — N2581 Secondary hyperparathyroidism of renal origin: Secondary | ICD-10-CM | POA: Diagnosis not present

## 2016-06-15 DIAGNOSIS — N186 End stage renal disease: Secondary | ICD-10-CM | POA: Diagnosis not present

## 2016-06-15 DIAGNOSIS — D509 Iron deficiency anemia, unspecified: Secondary | ICD-10-CM | POA: Diagnosis not present

## 2016-06-15 DIAGNOSIS — E1129 Type 2 diabetes mellitus with other diabetic kidney complication: Secondary | ICD-10-CM | POA: Diagnosis not present

## 2016-06-15 DIAGNOSIS — D631 Anemia in chronic kidney disease: Secondary | ICD-10-CM | POA: Diagnosis not present

## 2016-06-17 DIAGNOSIS — E1129 Type 2 diabetes mellitus with other diabetic kidney complication: Secondary | ICD-10-CM | POA: Diagnosis not present

## 2016-06-17 DIAGNOSIS — N186 End stage renal disease: Secondary | ICD-10-CM | POA: Diagnosis not present

## 2016-06-17 DIAGNOSIS — D631 Anemia in chronic kidney disease: Secondary | ICD-10-CM | POA: Diagnosis not present

## 2016-06-17 DIAGNOSIS — D509 Iron deficiency anemia, unspecified: Secondary | ICD-10-CM | POA: Diagnosis not present

## 2016-06-17 DIAGNOSIS — N2581 Secondary hyperparathyroidism of renal origin: Secondary | ICD-10-CM | POA: Diagnosis not present

## 2016-06-19 DIAGNOSIS — E1129 Type 2 diabetes mellitus with other diabetic kidney complication: Secondary | ICD-10-CM | POA: Diagnosis not present

## 2016-06-19 DIAGNOSIS — N186 End stage renal disease: Secondary | ICD-10-CM | POA: Diagnosis not present

## 2016-06-19 DIAGNOSIS — D631 Anemia in chronic kidney disease: Secondary | ICD-10-CM | POA: Diagnosis not present

## 2016-06-19 DIAGNOSIS — D509 Iron deficiency anemia, unspecified: Secondary | ICD-10-CM | POA: Diagnosis not present

## 2016-06-19 DIAGNOSIS — N2581 Secondary hyperparathyroidism of renal origin: Secondary | ICD-10-CM | POA: Diagnosis not present

## 2016-06-20 DIAGNOSIS — I129 Hypertensive chronic kidney disease with stage 1 through stage 4 chronic kidney disease, or unspecified chronic kidney disease: Secondary | ICD-10-CM | POA: Diagnosis not present

## 2016-06-20 DIAGNOSIS — Z992 Dependence on renal dialysis: Secondary | ICD-10-CM | POA: Diagnosis not present

## 2016-06-20 DIAGNOSIS — N186 End stage renal disease: Secondary | ICD-10-CM | POA: Diagnosis not present

## 2016-06-22 DIAGNOSIS — D509 Iron deficiency anemia, unspecified: Secondary | ICD-10-CM | POA: Diagnosis not present

## 2016-06-22 DIAGNOSIS — N186 End stage renal disease: Secondary | ICD-10-CM | POA: Diagnosis not present

## 2016-06-22 DIAGNOSIS — D631 Anemia in chronic kidney disease: Secondary | ICD-10-CM | POA: Diagnosis not present

## 2016-06-22 DIAGNOSIS — N2581 Secondary hyperparathyroidism of renal origin: Secondary | ICD-10-CM | POA: Diagnosis not present

## 2016-06-22 DIAGNOSIS — E1129 Type 2 diabetes mellitus with other diabetic kidney complication: Secondary | ICD-10-CM | POA: Diagnosis not present

## 2016-06-24 DIAGNOSIS — D631 Anemia in chronic kidney disease: Secondary | ICD-10-CM | POA: Diagnosis not present

## 2016-06-24 DIAGNOSIS — D509 Iron deficiency anemia, unspecified: Secondary | ICD-10-CM | POA: Diagnosis not present

## 2016-06-24 DIAGNOSIS — N2581 Secondary hyperparathyroidism of renal origin: Secondary | ICD-10-CM | POA: Diagnosis not present

## 2016-06-24 DIAGNOSIS — N186 End stage renal disease: Secondary | ICD-10-CM | POA: Diagnosis not present

## 2016-06-24 DIAGNOSIS — E1129 Type 2 diabetes mellitus with other diabetic kidney complication: Secondary | ICD-10-CM | POA: Diagnosis not present

## 2016-06-26 DIAGNOSIS — N186 End stage renal disease: Secondary | ICD-10-CM | POA: Diagnosis not present

## 2016-06-26 DIAGNOSIS — N2581 Secondary hyperparathyroidism of renal origin: Secondary | ICD-10-CM | POA: Diagnosis not present

## 2016-06-26 DIAGNOSIS — D509 Iron deficiency anemia, unspecified: Secondary | ICD-10-CM | POA: Diagnosis not present

## 2016-06-26 DIAGNOSIS — D631 Anemia in chronic kidney disease: Secondary | ICD-10-CM | POA: Diagnosis not present

## 2016-06-26 DIAGNOSIS — E1129 Type 2 diabetes mellitus with other diabetic kidney complication: Secondary | ICD-10-CM | POA: Diagnosis not present

## 2016-06-29 DIAGNOSIS — E1129 Type 2 diabetes mellitus with other diabetic kidney complication: Secondary | ICD-10-CM | POA: Diagnosis not present

## 2016-06-29 DIAGNOSIS — D509 Iron deficiency anemia, unspecified: Secondary | ICD-10-CM | POA: Diagnosis not present

## 2016-06-29 DIAGNOSIS — D631 Anemia in chronic kidney disease: Secondary | ICD-10-CM | POA: Diagnosis not present

## 2016-06-29 DIAGNOSIS — N186 End stage renal disease: Secondary | ICD-10-CM | POA: Diagnosis not present

## 2016-06-29 DIAGNOSIS — N2581 Secondary hyperparathyroidism of renal origin: Secondary | ICD-10-CM | POA: Diagnosis not present

## 2016-07-01 DIAGNOSIS — D631 Anemia in chronic kidney disease: Secondary | ICD-10-CM | POA: Diagnosis not present

## 2016-07-01 DIAGNOSIS — N186 End stage renal disease: Secondary | ICD-10-CM | POA: Diagnosis not present

## 2016-07-01 DIAGNOSIS — N2581 Secondary hyperparathyroidism of renal origin: Secondary | ICD-10-CM | POA: Diagnosis not present

## 2016-07-01 DIAGNOSIS — D509 Iron deficiency anemia, unspecified: Secondary | ICD-10-CM | POA: Diagnosis not present

## 2016-07-01 DIAGNOSIS — E1129 Type 2 diabetes mellitus with other diabetic kidney complication: Secondary | ICD-10-CM | POA: Diagnosis not present

## 2016-07-03 DIAGNOSIS — N186 End stage renal disease: Secondary | ICD-10-CM | POA: Diagnosis not present

## 2016-07-03 DIAGNOSIS — N2581 Secondary hyperparathyroidism of renal origin: Secondary | ICD-10-CM | POA: Diagnosis not present

## 2016-07-03 DIAGNOSIS — E1129 Type 2 diabetes mellitus with other diabetic kidney complication: Secondary | ICD-10-CM | POA: Diagnosis not present

## 2016-07-03 DIAGNOSIS — D509 Iron deficiency anemia, unspecified: Secondary | ICD-10-CM | POA: Diagnosis not present

## 2016-07-03 DIAGNOSIS — D631 Anemia in chronic kidney disease: Secondary | ICD-10-CM | POA: Diagnosis not present

## 2016-07-06 DIAGNOSIS — N186 End stage renal disease: Secondary | ICD-10-CM | POA: Diagnosis not present

## 2016-07-06 DIAGNOSIS — E1129 Type 2 diabetes mellitus with other diabetic kidney complication: Secondary | ICD-10-CM | POA: Diagnosis not present

## 2016-07-06 DIAGNOSIS — D509 Iron deficiency anemia, unspecified: Secondary | ICD-10-CM | POA: Diagnosis not present

## 2016-07-06 DIAGNOSIS — N2581 Secondary hyperparathyroidism of renal origin: Secondary | ICD-10-CM | POA: Diagnosis not present

## 2016-07-06 DIAGNOSIS — D631 Anemia in chronic kidney disease: Secondary | ICD-10-CM | POA: Diagnosis not present

## 2016-07-08 DIAGNOSIS — N186 End stage renal disease: Secondary | ICD-10-CM | POA: Diagnosis not present

## 2016-07-08 DIAGNOSIS — N2581 Secondary hyperparathyroidism of renal origin: Secondary | ICD-10-CM | POA: Diagnosis not present

## 2016-07-08 DIAGNOSIS — D509 Iron deficiency anemia, unspecified: Secondary | ICD-10-CM | POA: Diagnosis not present

## 2016-07-08 DIAGNOSIS — D631 Anemia in chronic kidney disease: Secondary | ICD-10-CM | POA: Diagnosis not present

## 2016-07-08 DIAGNOSIS — E1129 Type 2 diabetes mellitus with other diabetic kidney complication: Secondary | ICD-10-CM | POA: Diagnosis not present

## 2016-07-10 ENCOUNTER — Other Ambulatory Visit: Payer: Self-pay

## 2016-07-10 DIAGNOSIS — D509 Iron deficiency anemia, unspecified: Secondary | ICD-10-CM | POA: Diagnosis not present

## 2016-07-10 DIAGNOSIS — D631 Anemia in chronic kidney disease: Secondary | ICD-10-CM | POA: Diagnosis not present

## 2016-07-10 DIAGNOSIS — E1129 Type 2 diabetes mellitus with other diabetic kidney complication: Secondary | ICD-10-CM | POA: Diagnosis not present

## 2016-07-10 DIAGNOSIS — N186 End stage renal disease: Secondary | ICD-10-CM | POA: Diagnosis not present

## 2016-07-10 DIAGNOSIS — N2581 Secondary hyperparathyroidism of renal origin: Secondary | ICD-10-CM | POA: Diagnosis not present

## 2016-07-13 ENCOUNTER — Other Ambulatory Visit (HOSPITAL_COMMUNITY): Payer: Self-pay | Admitting: *Deleted

## 2016-07-13 DIAGNOSIS — D631 Anemia in chronic kidney disease: Secondary | ICD-10-CM | POA: Diagnosis not present

## 2016-07-13 DIAGNOSIS — E1129 Type 2 diabetes mellitus with other diabetic kidney complication: Secondary | ICD-10-CM | POA: Diagnosis not present

## 2016-07-13 DIAGNOSIS — N2581 Secondary hyperparathyroidism of renal origin: Secondary | ICD-10-CM | POA: Diagnosis not present

## 2016-07-13 DIAGNOSIS — D509 Iron deficiency anemia, unspecified: Secondary | ICD-10-CM | POA: Diagnosis not present

## 2016-07-13 DIAGNOSIS — N186 End stage renal disease: Secondary | ICD-10-CM | POA: Diagnosis not present

## 2016-07-14 ENCOUNTER — Ambulatory Visit (HOSPITAL_COMMUNITY)
Admission: RE | Admit: 2016-07-14 | Discharge: 2016-07-14 | Disposition: A | Discharge status: Home / Self Care | Payer: Medicare Other | Source: Ambulatory Visit | Attending: Surgery | Admitting: Surgery

## 2016-07-14 DIAGNOSIS — J45909 Unspecified asthma, uncomplicated: Secondary | ICD-10-CM | POA: Diagnosis not present

## 2016-07-14 DIAGNOSIS — I120 Hypertensive chronic kidney disease with stage 5 chronic kidney disease or end stage renal disease: Secondary | ICD-10-CM | POA: Insufficient documentation

## 2016-07-14 DIAGNOSIS — Z8042 Family history of malignant neoplasm of prostate: Secondary | ICD-10-CM | POA: Diagnosis not present

## 2016-07-14 DIAGNOSIS — Z888 Allergy status to other drugs, medicaments and biological substances status: Secondary | ICD-10-CM | POA: Diagnosis not present

## 2016-07-14 DIAGNOSIS — M069 Rheumatoid arthritis, unspecified: Secondary | ICD-10-CM | POA: Insufficient documentation

## 2016-07-14 DIAGNOSIS — M109 Gout, unspecified: Secondary | ICD-10-CM | POA: Insufficient documentation

## 2016-07-14 DIAGNOSIS — Z880 Allergy status to penicillin: Secondary | ICD-10-CM | POA: Insufficient documentation

## 2016-07-14 DIAGNOSIS — Z803 Family history of malignant neoplasm of breast: Secondary | ICD-10-CM | POA: Diagnosis not present

## 2016-07-14 DIAGNOSIS — K219 Gastro-esophageal reflux disease without esophagitis: Secondary | ICD-10-CM | POA: Diagnosis not present

## 2016-07-14 DIAGNOSIS — N186 End stage renal disease: Secondary | ICD-10-CM | POA: Diagnosis not present

## 2016-07-14 DIAGNOSIS — E039 Hypothyroidism, unspecified: Secondary | ICD-10-CM | POA: Diagnosis not present

## 2016-07-14 DIAGNOSIS — D631 Anemia in chronic kidney disease: Secondary | ICD-10-CM | POA: Diagnosis not present

## 2016-07-14 DIAGNOSIS — G4730 Sleep apnea, unspecified: Secondary | ICD-10-CM | POA: Insufficient documentation

## 2016-07-14 DIAGNOSIS — Z87442 Personal history of urinary calculi: Secondary | ICD-10-CM | POA: Insufficient documentation

## 2016-07-14 DIAGNOSIS — Z8249 Family history of ischemic heart disease and other diseases of the circulatory system: Secondary | ICD-10-CM | POA: Diagnosis not present

## 2016-07-14 DIAGNOSIS — E7800 Pure hypercholesterolemia, unspecified: Secondary | ICD-10-CM | POA: Insufficient documentation

## 2016-07-14 DIAGNOSIS — R7303 Prediabetes: Secondary | ICD-10-CM | POA: Diagnosis not present

## 2016-07-14 NOTE — Progress Notes (Addendum)
Pt able to repeat back instructions given to her by PA (do not lay down for 4 hours, no heavy lifting, keep bandage on until tomorrow evening) Gauze bandage over site clean, dry and intact.   1130 dressing remains clean dry and intact. Pt denies lightheadedness or dizziness. Discharged with partner ambulatory without complaint.

## 2016-07-14 NOTE — Progress Notes (Signed)
[facility] SHORT STAY H&P  CC: ESRD   HPI: Rachael George is a 61 y.o. female who has been on HD through  functioning Hemodialysis access in the left upper extremity. They are here for HD catheter removal. Pt. denies signs of steal syndrome.  Past Medical History:  Diagnosis Date  . Anemia   . Asthma   . ESRD (end stage renal disease) ([place])    MWF  . GERD (gastroesophageal reflux disease)   . Gout   . 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 Heavy menstrual bleeding   . High cholesterol   . History of blood transfusion 02/2016- last one   1990's- several ones   . History of kidney stones 1980's  . Hyperlipemia 12/07/2012  . Hypertension   . Hypothyroidism   . Kidney stones   . Pre-diabetes   . Prediabetes   . RA (rheumatoid arthritis) ([place])   . Sleep apnea    [place]- have CPAP, not able to use every night     Family Hx Family History  Problem Relation Age of Onset  . Hypertension Mother   . Breast cancer Maternal Aunt   . Deep vein thrombosis Daughter   . Hyperlipidemia Daughter   . Hypertension Daughter   . Prostate cancer Maternal Grandmother     Social HX Social History  Substance Use Topics  . Smoking status: Never Smoker  . Smokeless tobacco: Never Used  . Alcohol use 0.0 oz/week     Comment: occ - 1 drink rarely    Allergies Allergies  Allergen Reactions  . Atacand [Candesartan] Anaphylaxis    Swelling of the mouth and tongue.  Marland Kitchen Lisinopril Anaphylaxis    Swelling of the mouth and tongue.  . Nsaids Anaphylaxis    Swelling of the mouth and tongue.  Marland Kitchen Penicillins Rash    Has patient had a PCN reaction causing immediate rash, facial/tongue/throat swelling, SOB or lightheadedness with hypotension: No Has patient had a PCN reaction causing severe rash involving mucus membranes or skin necrosis: No Has patient had a PCN reaction that required hospitalization No Has patient had a PCN reaction occurring within the  last 10 years: No If all of the above answers are "NO", then may proceed with Cephalosporin use.     Medications Current Outpatient Prescriptions  Medication Sig Dispense Refill  . acetaminophen (TYLENOL) 500 MG tablet Take 1,000 mg by mouth every 6 (six) hours as needed for mild pain.     Marland Kitchen albuterol (PROVENTIL HFA;VENTOLIN HFA) 108 (90 Base) MCG/ACT inhaler Inhale 2 puffs into the lungs every 6 (six) hours as needed for wheezing or shortness of breath. 1 Inhaler 2  . allopurinol (ZYLOPRIM) 100 MG tablet Take 100 mg by mouth 2 (two) times daily. Reported on 09/02/2015    . amLODipine (NORVASC) 10 MG tablet Take 10 mg by mouth at bedtime.     Marland Kitchen b complex-vitamin c-folic acid (NEPHRO-VITE) 0.8 MG TABS tablet Take 1 tablet by mouth daily.    . cinacalcet (SENSIPAR) 60 MG tablet Take 60 mg by mouth every other day.    . colchicine 0.6 MG tablet Take 2 tabs (1.2mg ) at the onset of a gout flare, may repeat 1 tab (0.6mg ) if symptoms persist (Patient taking differently: Take 0.6-1.2 mg by mouth See admin instructions. Take 2 tabs (1.2mg ) at the onset of a gout flare, may repeat 1 tab (0.6mg ) if symptoms persist) 30 tablet 2  .  famotidine (PEPCID) 20 MG tablet Take 20 mg by mouth 2 (two) times daily.    . fluticasone (FLOVENT HFA) 110 MCG/ACT inhaler Inhale 1 puff into the lungs 2 (two) times daily. 1 Inhaler 12  . oxyCODONE-acetaminophen (PERCOCET/ROXICET) 5-325 MG tablet Take 1 tablet by mouth every 4 (four) hours as needed. (Patient not taking: Reported on 06/04/2016) 15 tablet 0  . pantoprazole (PROTONIX) 40 MG tablet Take 40 mg by mouth 2 (two) times daily.    . promethazine (PHENERGAN) 25 MG tablet Take 25 mg by mouth every 6 (six) hours as needed for nausea or vomiting.     . sevelamer (RENAGEL) 800 MG tablet Take 1,600-3,200 mg by mouth See admin instructions. 3,200 mg three times a day with meals and 1,600 mg twice a day with snacks     No current facility-administered medications for this  encounter.     Labs COAG Lab Results  Component Value Date   INR 1.19 03/03/2016   No results found for: PTT  PHYSICAL EXAM  There were no vitals filed for this visit.  General:  WDWN in NAD HENT: WNL Eyes: Pupils equal Pulmonary: normal non-labored breathing  Cardiac: RRR, Skin: normal, no cyanosis, jaundice, pallor or bruising Vascular Exam/Pulses: 2+  pulses in bilateral extremity. Extremities without ischemic changes, no Gangrene , no cellulitis; no open wounds;   There is a good thrill and good bruit in the left UE graft. Hand grip is 5/5 and sensation in digits is intact;   Impression: This is a 61 y.o. female who has a functioning HD access.  Plan: Removal of Right IJ HD catheter Yevette Knust [facility] 07/14/2016 1:45 PM  [facility] Catheter Removal Procedure Note  Diagnosis: ESRD with Functioning AVF/AVGG  Plan:  Remove right diatek catheter  Consent signed:  yes Time out completed:  yes Coumadin:  No. PT/INR (if applicable):   Other labs:   Procedure: 1.  Sterile prepping and draping over catheter area 2. 5 ml 2% lidocaine plain instilled at removal site. 3.  right catheter removed in its entirety with cuff in tact. 4.  Complications: none  5. Tip of catheter sent for culture:  no   Patient tolerated procedure well:  yes Pressure held, no bleeding noted, dressing applied Instructions given to the pt regarding wound care and bleeding.  OtherLaurence Slate [facility] 07/14/2016 1:45 PM

## 2016-07-15 DIAGNOSIS — D509 Iron deficiency anemia, unspecified: Secondary | ICD-10-CM | POA: Diagnosis not present

## 2016-07-15 DIAGNOSIS — N2581 Secondary hyperparathyroidism of renal origin: Secondary | ICD-10-CM | POA: Diagnosis not present

## 2016-07-15 DIAGNOSIS — N186 End stage renal disease: Secondary | ICD-10-CM | POA: Diagnosis not present

## 2016-07-15 DIAGNOSIS — D631 Anemia in chronic kidney disease: Secondary | ICD-10-CM | POA: Diagnosis not present

## 2016-07-15 DIAGNOSIS — E1129 Type 2 diabetes mellitus with other diabetic kidney complication: Secondary | ICD-10-CM | POA: Diagnosis not present

## 2016-07-17 DIAGNOSIS — N186 End stage renal disease: Secondary | ICD-10-CM | POA: Diagnosis not present

## 2016-07-17 DIAGNOSIS — N2581 Secondary hyperparathyroidism of renal origin: Secondary | ICD-10-CM | POA: Diagnosis not present

## 2016-07-17 DIAGNOSIS — E1129 Type 2 diabetes mellitus with other diabetic kidney complication: Secondary | ICD-10-CM | POA: Diagnosis not present

## 2016-07-17 DIAGNOSIS — D631 Anemia in chronic kidney disease: Secondary | ICD-10-CM | POA: Diagnosis not present

## 2016-07-17 DIAGNOSIS — D509 Iron deficiency anemia, unspecified: Secondary | ICD-10-CM | POA: Diagnosis not present

## 2016-07-20 DIAGNOSIS — D631 Anemia in chronic kidney disease: Secondary | ICD-10-CM | POA: Diagnosis not present

## 2016-07-20 DIAGNOSIS — I129 Hypertensive chronic kidney disease with stage 1 through stage 4 chronic kidney disease, or unspecified chronic kidney disease: Secondary | ICD-10-CM | POA: Diagnosis not present

## 2016-07-20 DIAGNOSIS — N186 End stage renal disease: Secondary | ICD-10-CM | POA: Diagnosis not present

## 2016-07-20 DIAGNOSIS — N2581 Secondary hyperparathyroidism of renal origin: Secondary | ICD-10-CM | POA: Diagnosis not present

## 2016-07-20 DIAGNOSIS — D509 Iron deficiency anemia, unspecified: Secondary | ICD-10-CM | POA: Diagnosis not present

## 2016-07-20 DIAGNOSIS — Z992 Dependence on renal dialysis: Secondary | ICD-10-CM | POA: Diagnosis not present

## 2016-07-20 DIAGNOSIS — E1129 Type 2 diabetes mellitus with other diabetic kidney complication: Secondary | ICD-10-CM | POA: Diagnosis not present

## 2016-07-22 DIAGNOSIS — E1129 Type 2 diabetes mellitus with other diabetic kidney complication: Secondary | ICD-10-CM | POA: Diagnosis not present

## 2016-07-22 DIAGNOSIS — D509 Iron deficiency anemia, unspecified: Secondary | ICD-10-CM | POA: Diagnosis not present

## 2016-07-22 DIAGNOSIS — D631 Anemia in chronic kidney disease: Secondary | ICD-10-CM | POA: Diagnosis not present

## 2016-07-22 DIAGNOSIS — N2581 Secondary hyperparathyroidism of renal origin: Secondary | ICD-10-CM | POA: Diagnosis not present

## 2016-07-22 DIAGNOSIS — N186 End stage renal disease: Secondary | ICD-10-CM | POA: Diagnosis not present

## 2016-07-24 DIAGNOSIS — E1129 Type 2 diabetes mellitus with other diabetic kidney complication: Secondary | ICD-10-CM | POA: Diagnosis not present

## 2016-07-24 DIAGNOSIS — D509 Iron deficiency anemia, unspecified: Secondary | ICD-10-CM | POA: Diagnosis not present

## 2016-07-24 DIAGNOSIS — N2581 Secondary hyperparathyroidism of renal origin: Secondary | ICD-10-CM | POA: Diagnosis not present

## 2016-07-24 DIAGNOSIS — D631 Anemia in chronic kidney disease: Secondary | ICD-10-CM | POA: Diagnosis not present

## 2016-07-24 DIAGNOSIS — N186 End stage renal disease: Secondary | ICD-10-CM | POA: Diagnosis not present

## 2016-07-27 DIAGNOSIS — N186 End stage renal disease: Secondary | ICD-10-CM | POA: Diagnosis not present

## 2016-07-27 DIAGNOSIS — D631 Anemia in chronic kidney disease: Secondary | ICD-10-CM | POA: Diagnosis not present

## 2016-07-27 DIAGNOSIS — N2581 Secondary hyperparathyroidism of renal origin: Secondary | ICD-10-CM | POA: Diagnosis not present

## 2016-07-27 DIAGNOSIS — D509 Iron deficiency anemia, unspecified: Secondary | ICD-10-CM | POA: Diagnosis not present

## 2016-07-27 DIAGNOSIS — E1129 Type 2 diabetes mellitus with other diabetic kidney complication: Secondary | ICD-10-CM | POA: Diagnosis not present

## 2016-07-29 DIAGNOSIS — E1129 Type 2 diabetes mellitus with other diabetic kidney complication: Secondary | ICD-10-CM | POA: Diagnosis not present

## 2016-07-29 DIAGNOSIS — N2581 Secondary hyperparathyroidism of renal origin: Secondary | ICD-10-CM | POA: Diagnosis not present

## 2016-07-29 DIAGNOSIS — D631 Anemia in chronic kidney disease: Secondary | ICD-10-CM | POA: Diagnosis not present

## 2016-07-29 DIAGNOSIS — D509 Iron deficiency anemia, unspecified: Secondary | ICD-10-CM | POA: Diagnosis not present

## 2016-07-29 DIAGNOSIS — N186 End stage renal disease: Secondary | ICD-10-CM | POA: Diagnosis not present

## 2016-07-31 DIAGNOSIS — N2581 Secondary hyperparathyroidism of renal origin: Secondary | ICD-10-CM | POA: Diagnosis not present

## 2016-07-31 DIAGNOSIS — D631 Anemia in chronic kidney disease: Secondary | ICD-10-CM | POA: Diagnosis not present

## 2016-07-31 DIAGNOSIS — E1129 Type 2 diabetes mellitus with other diabetic kidney complication: Secondary | ICD-10-CM | POA: Diagnosis not present

## 2016-07-31 DIAGNOSIS — D509 Iron deficiency anemia, unspecified: Secondary | ICD-10-CM | POA: Diagnosis not present

## 2016-07-31 DIAGNOSIS — N186 End stage renal disease: Secondary | ICD-10-CM | POA: Diagnosis not present

## 2016-08-03 DIAGNOSIS — D631 Anemia in chronic kidney disease: Secondary | ICD-10-CM | POA: Diagnosis not present

## 2016-08-03 DIAGNOSIS — E1129 Type 2 diabetes mellitus with other diabetic kidney complication: Secondary | ICD-10-CM | POA: Diagnosis not present

## 2016-08-03 DIAGNOSIS — N2581 Secondary hyperparathyroidism of renal origin: Secondary | ICD-10-CM | POA: Diagnosis not present

## 2016-08-03 DIAGNOSIS — D509 Iron deficiency anemia, unspecified: Secondary | ICD-10-CM | POA: Diagnosis not present

## 2016-08-03 DIAGNOSIS — N186 End stage renal disease: Secondary | ICD-10-CM | POA: Diagnosis not present

## 2016-08-05 DIAGNOSIS — N2581 Secondary hyperparathyroidism of renal origin: Secondary | ICD-10-CM | POA: Diagnosis not present

## 2016-08-05 DIAGNOSIS — D631 Anemia in chronic kidney disease: Secondary | ICD-10-CM | POA: Diagnosis not present

## 2016-08-05 DIAGNOSIS — D509 Iron deficiency anemia, unspecified: Secondary | ICD-10-CM | POA: Diagnosis not present

## 2016-08-05 DIAGNOSIS — E1129 Type 2 diabetes mellitus with other diabetic kidney complication: Secondary | ICD-10-CM | POA: Diagnosis not present

## 2016-08-05 DIAGNOSIS — N186 End stage renal disease: Secondary | ICD-10-CM | POA: Diagnosis not present

## 2016-08-07 DIAGNOSIS — D631 Anemia in chronic kidney disease: Secondary | ICD-10-CM | POA: Diagnosis not present

## 2016-08-07 DIAGNOSIS — E1129 Type 2 diabetes mellitus with other diabetic kidney complication: Secondary | ICD-10-CM | POA: Diagnosis not present

## 2016-08-07 DIAGNOSIS — N186 End stage renal disease: Secondary | ICD-10-CM | POA: Diagnosis not present

## 2016-08-07 DIAGNOSIS — N2581 Secondary hyperparathyroidism of renal origin: Secondary | ICD-10-CM | POA: Diagnosis not present

## 2016-08-07 DIAGNOSIS — D509 Iron deficiency anemia, unspecified: Secondary | ICD-10-CM | POA: Diagnosis not present

## 2016-08-12 DIAGNOSIS — N186 End stage renal disease: Secondary | ICD-10-CM | POA: Diagnosis not present

## 2016-08-12 DIAGNOSIS — D509 Iron deficiency anemia, unspecified: Secondary | ICD-10-CM | POA: Diagnosis not present

## 2016-08-12 DIAGNOSIS — N2581 Secondary hyperparathyroidism of renal origin: Secondary | ICD-10-CM | POA: Diagnosis not present

## 2016-08-12 DIAGNOSIS — E1129 Type 2 diabetes mellitus with other diabetic kidney complication: Secondary | ICD-10-CM | POA: Diagnosis not present

## 2016-08-12 DIAGNOSIS — D631 Anemia in chronic kidney disease: Secondary | ICD-10-CM | POA: Diagnosis not present

## 2016-08-14 DIAGNOSIS — N186 End stage renal disease: Secondary | ICD-10-CM | POA: Diagnosis not present

## 2016-08-14 DIAGNOSIS — D631 Anemia in chronic kidney disease: Secondary | ICD-10-CM | POA: Diagnosis not present

## 2016-08-14 DIAGNOSIS — E1129 Type 2 diabetes mellitus with other diabetic kidney complication: Secondary | ICD-10-CM | POA: Diagnosis not present

## 2016-08-14 DIAGNOSIS — D509 Iron deficiency anemia, unspecified: Secondary | ICD-10-CM | POA: Diagnosis not present

## 2016-08-14 DIAGNOSIS — N2581 Secondary hyperparathyroidism of renal origin: Secondary | ICD-10-CM | POA: Diagnosis not present

## 2016-08-17 DIAGNOSIS — E1129 Type 2 diabetes mellitus with other diabetic kidney complication: Secondary | ICD-10-CM | POA: Diagnosis not present

## 2016-08-17 DIAGNOSIS — N186 End stage renal disease: Secondary | ICD-10-CM | POA: Diagnosis not present

## 2016-08-17 DIAGNOSIS — D631 Anemia in chronic kidney disease: Secondary | ICD-10-CM | POA: Diagnosis not present

## 2016-08-17 DIAGNOSIS — N2581 Secondary hyperparathyroidism of renal origin: Secondary | ICD-10-CM | POA: Diagnosis not present

## 2016-08-17 DIAGNOSIS — D509 Iron deficiency anemia, unspecified: Secondary | ICD-10-CM | POA: Diagnosis not present

## 2016-08-19 DIAGNOSIS — D631 Anemia in chronic kidney disease: Secondary | ICD-10-CM | POA: Diagnosis not present

## 2016-08-19 DIAGNOSIS — N2581 Secondary hyperparathyroidism of renal origin: Secondary | ICD-10-CM | POA: Diagnosis not present

## 2016-08-19 DIAGNOSIS — E1129 Type 2 diabetes mellitus with other diabetic kidney complication: Secondary | ICD-10-CM | POA: Diagnosis not present

## 2016-08-19 DIAGNOSIS — N186 End stage renal disease: Secondary | ICD-10-CM | POA: Diagnosis not present

## 2016-08-19 DIAGNOSIS — D509 Iron deficiency anemia, unspecified: Secondary | ICD-10-CM | POA: Diagnosis not present

## 2016-08-20 DIAGNOSIS — I129 Hypertensive chronic kidney disease with stage 1 through stage 4 chronic kidney disease, or unspecified chronic kidney disease: Secondary | ICD-10-CM | POA: Diagnosis not present

## 2016-08-20 DIAGNOSIS — N186 End stage renal disease: Secondary | ICD-10-CM | POA: Diagnosis not present

## 2016-08-20 DIAGNOSIS — Z992 Dependence on renal dialysis: Secondary | ICD-10-CM | POA: Diagnosis not present

## 2016-08-21 DIAGNOSIS — N186 End stage renal disease: Secondary | ICD-10-CM | POA: Diagnosis not present

## 2016-08-21 DIAGNOSIS — E1129 Type 2 diabetes mellitus with other diabetic kidney complication: Secondary | ICD-10-CM | POA: Diagnosis not present

## 2016-08-21 DIAGNOSIS — N2581 Secondary hyperparathyroidism of renal origin: Secondary | ICD-10-CM | POA: Diagnosis not present

## 2016-08-21 DIAGNOSIS — D509 Iron deficiency anemia, unspecified: Secondary | ICD-10-CM | POA: Diagnosis not present

## 2016-08-21 DIAGNOSIS — D631 Anemia in chronic kidney disease: Secondary | ICD-10-CM | POA: Diagnosis not present

## 2016-08-24 DIAGNOSIS — E1129 Type 2 diabetes mellitus with other diabetic kidney complication: Secondary | ICD-10-CM | POA: Diagnosis not present

## 2016-08-24 DIAGNOSIS — N186 End stage renal disease: Secondary | ICD-10-CM | POA: Diagnosis not present

## 2016-08-24 DIAGNOSIS — N2581 Secondary hyperparathyroidism of renal origin: Secondary | ICD-10-CM | POA: Diagnosis not present

## 2016-08-24 DIAGNOSIS — D631 Anemia in chronic kidney disease: Secondary | ICD-10-CM | POA: Diagnosis not present

## 2016-08-24 DIAGNOSIS — D509 Iron deficiency anemia, unspecified: Secondary | ICD-10-CM | POA: Diagnosis not present

## 2016-08-24 NOTE — Anesthesia Postprocedure Evaluation (Signed)
Anesthesia Post Note  Patient: Rachael George  Procedure(s) Performed: Procedure(s) (LRB): FIRST STAGE BASILIC VEIN TRANSPOSITION LEFT ARM (Left) EXCHANGE OF A DIALYSIS CATHETER - INSERTION RIGHT INTERNAL JUGULAR & REMOVAL FROM LEFT INTERNAL JUGULAR (Right)     Anesthesia Post Evaluation  Last Vitals:  Vitals:   04/22/16 1315 04/22/16 1330  BP: (!) 174/99 (!) 170/97  Pulse: 75 75  Resp: 13 12  Temp:  36.4 C    Last Pain:  Vitals:   04/22/16 1315  TempSrc:   PainSc: 0-No pain                 Alvino Lechuga S

## 2016-08-24 NOTE — Addendum Note (Signed)
Addendum  created 08/24/16 1210 by Oleta Mouse, MD   Sign clinical note

## 2016-08-24 NOTE — Addendum Note (Signed)
Addendum  created 08/24/16 1042 by Tashara Suder, MD   Sign clinical note    

## 2016-08-26 DIAGNOSIS — N186 End stage renal disease: Secondary | ICD-10-CM | POA: Diagnosis not present

## 2016-08-26 DIAGNOSIS — N2581 Secondary hyperparathyroidism of renal origin: Secondary | ICD-10-CM | POA: Diagnosis not present

## 2016-08-26 DIAGNOSIS — D631 Anemia in chronic kidney disease: Secondary | ICD-10-CM | POA: Diagnosis not present

## 2016-08-26 DIAGNOSIS — E1129 Type 2 diabetes mellitus with other diabetic kidney complication: Secondary | ICD-10-CM | POA: Diagnosis not present

## 2016-08-26 DIAGNOSIS — D509 Iron deficiency anemia, unspecified: Secondary | ICD-10-CM | POA: Diagnosis not present

## 2016-08-31 DIAGNOSIS — N186 End stage renal disease: Secondary | ICD-10-CM | POA: Diagnosis not present

## 2016-08-31 DIAGNOSIS — E1129 Type 2 diabetes mellitus with other diabetic kidney complication: Secondary | ICD-10-CM | POA: Diagnosis not present

## 2016-08-31 DIAGNOSIS — N2581 Secondary hyperparathyroidism of renal origin: Secondary | ICD-10-CM | POA: Diagnosis not present

## 2016-08-31 DIAGNOSIS — D631 Anemia in chronic kidney disease: Secondary | ICD-10-CM | POA: Diagnosis not present

## 2016-08-31 DIAGNOSIS — D509 Iron deficiency anemia, unspecified: Secondary | ICD-10-CM | POA: Diagnosis not present

## 2016-09-01 ENCOUNTER — Other Ambulatory Visit: Payer: Self-pay

## 2016-09-02 DIAGNOSIS — N2581 Secondary hyperparathyroidism of renal origin: Secondary | ICD-10-CM | POA: Diagnosis not present

## 2016-09-02 DIAGNOSIS — N186 End stage renal disease: Secondary | ICD-10-CM | POA: Diagnosis not present

## 2016-09-02 DIAGNOSIS — D631 Anemia in chronic kidney disease: Secondary | ICD-10-CM | POA: Diagnosis not present

## 2016-09-02 DIAGNOSIS — E1129 Type 2 diabetes mellitus with other diabetic kidney complication: Secondary | ICD-10-CM | POA: Diagnosis not present

## 2016-09-02 DIAGNOSIS — D509 Iron deficiency anemia, unspecified: Secondary | ICD-10-CM | POA: Diagnosis not present

## 2016-09-04 DIAGNOSIS — E1129 Type 2 diabetes mellitus with other diabetic kidney complication: Secondary | ICD-10-CM | POA: Diagnosis not present

## 2016-09-04 DIAGNOSIS — D631 Anemia in chronic kidney disease: Secondary | ICD-10-CM | POA: Diagnosis not present

## 2016-09-04 DIAGNOSIS — N186 End stage renal disease: Secondary | ICD-10-CM | POA: Diagnosis not present

## 2016-09-04 DIAGNOSIS — N2581 Secondary hyperparathyroidism of renal origin: Secondary | ICD-10-CM | POA: Diagnosis not present

## 2016-09-04 DIAGNOSIS — D509 Iron deficiency anemia, unspecified: Secondary | ICD-10-CM | POA: Diagnosis not present

## 2016-09-07 DIAGNOSIS — N186 End stage renal disease: Secondary | ICD-10-CM | POA: Diagnosis not present

## 2016-09-07 DIAGNOSIS — N2581 Secondary hyperparathyroidism of renal origin: Secondary | ICD-10-CM | POA: Diagnosis not present

## 2016-09-07 DIAGNOSIS — E1129 Type 2 diabetes mellitus with other diabetic kidney complication: Secondary | ICD-10-CM | POA: Diagnosis not present

## 2016-09-07 DIAGNOSIS — D509 Iron deficiency anemia, unspecified: Secondary | ICD-10-CM | POA: Diagnosis not present

## 2016-09-07 DIAGNOSIS — D631 Anemia in chronic kidney disease: Secondary | ICD-10-CM | POA: Diagnosis not present

## 2016-09-08 ENCOUNTER — Encounter (HOSPITAL_COMMUNITY)
Admission: RE | Disposition: A | Discharge status: Home / Self Care | Payer: Self-pay | Source: Ambulatory Visit | Attending: Surgery

## 2016-09-08 ENCOUNTER — Encounter (HOSPITAL_COMMUNITY): Payer: Self-pay | Admitting: Surgery

## 2016-09-08 ENCOUNTER — Ambulatory Visit (HOSPITAL_COMMUNITY)
Admission: RE | Admit: 2016-09-08 | Discharge: 2016-09-08 | Disposition: A | Discharge status: Home / Self Care | Payer: Medicare Other | Source: Ambulatory Visit | Attending: Surgery | Admitting: Surgery

## 2016-09-08 DIAGNOSIS — T82858A Stenosis of vascular prosthetic devices, implants and grafts, initial encounter: Secondary | ICD-10-CM | POA: Diagnosis not present

## 2016-09-08 DIAGNOSIS — N186 End stage renal disease: Secondary | ICD-10-CM | POA: Insufficient documentation

## 2016-09-08 DIAGNOSIS — N185 Chronic kidney disease, stage 5: Secondary | ICD-10-CM | POA: Diagnosis not present

## 2016-09-08 DIAGNOSIS — Y832 Surgical operation with anastomosis, bypass or graft as the cause of abnormal reaction of the patient, or of later complication, without mention of misadventure at the time of the procedure: Secondary | ICD-10-CM | POA: Diagnosis not present

## 2016-09-08 DIAGNOSIS — T82898A Other specified complication of vascular prosthetic devices, implants and grafts, initial encounter: Secondary | ICD-10-CM | POA: Diagnosis not present

## 2016-09-08 HISTORY — PX: A/V SHUNTOGRAM: CATH118297

## 2016-09-08 HISTORY — PX: PERIPHERAL VASCULAR BALLOON ANGIOPLASTY: CATH118281

## 2016-09-08 LAB — POCT I-STAT, CHEM 8
BUN: 37 mg/dL — ABNORMAL HIGH (ref 6–20)
Calcium, Ion: 1.25 mmol/L (ref 1.15–1.40)
Chloride: 100 mmol/L — ABNORMAL LOW (ref 101–111)
Creatinine, Ser: 6.2 mg/dL — ABNORMAL HIGH (ref 0.44–1.00)
Glucose, Bld: 81 mg/dL (ref 65–99)
HCT: 34 % — ABNORMAL LOW (ref 36.0–46.0)
Hemoglobin: 11.6 g/dL — ABNORMAL LOW (ref 12.0–15.0)
Potassium: 4.7 mmol/L (ref 3.5–5.1)
Sodium: 140 mmol/L (ref 135–145)
TCO2: 31 mmol/L (ref 0–100)

## 2016-09-08 SURGERY — A/V SHUNTOGRAM
Anesthesia: LOCAL

## 2016-09-08 MED ORDER — HEPARIN (PORCINE) IN NACL 2-0.9 UNIT/ML-% IJ SOLN
INTRAMUSCULAR | Status: AC | PRN
  Administered 2016-09-08: 500 mL

## 2016-09-08 MED ORDER — FENTANYL CITRATE (PF) 100 MCG/2ML IJ SOLN
INTRAMUSCULAR | Status: DC | PRN
Start: 2016-09-08 — End: 2016-09-08
  Administered 2016-09-08: 25 ug via INTRAVENOUS

## 2016-09-08 MED ORDER — SODIUM CHLORIDE 0.9% FLUSH: 3.0000 mL | INTRAVENOUS | Status: DC | PRN

## 2016-09-08 MED ORDER — FENTANYL CITRATE (PF) 100 MCG/2ML IJ SOLN
INTRAMUSCULAR | Status: AC
  Filled 2016-09-08: qty 2

## 2016-09-08 MED ORDER — HEPARIN (PORCINE) IN NACL 2-0.9 UNIT/ML-% IJ SOLN
INTRAMUSCULAR | Status: AC
  Filled 2016-09-08: qty 500

## 2016-09-08 MED ORDER — LIDOCAINE HCL (PF) 1 % IJ SOLN
INTRAMUSCULAR | Status: DC | PRN
  Administered 2016-09-08: 5 mL

## 2016-09-08 MED ORDER — MIDAZOLAM HCL 2 MG/2ML IJ SOLN
INTRAMUSCULAR | Status: DC | PRN
  Administered 2016-09-08: 1 mg via INTRAVENOUS

## 2016-09-08 MED ORDER — LIDOCAINE HCL (PF) 1 % IJ SOLN
INTRAMUSCULAR | Status: AC
  Filled 2016-09-08: qty 30

## 2016-09-08 MED ORDER — MIDAZOLAM HCL 2 MG/2ML IJ SOLN
INTRAMUSCULAR | Status: AC
  Filled 2016-09-08: qty 2

## 2016-09-08 MED ORDER — IODIXANOL 320 MG/ML IV SOLN
INTRAVENOUS | Status: DC | PRN
  Administered 2016-09-08: 38 mL via INTRAVENOUS

## 2016-09-08 SURGICAL SUPPLY — 15 items
BAG SNAP BAND KOVER 36X36 (MISCELLANEOUS) ×3 IMPLANT
BALLN LUTONIX AV 7X40X75 (BALLOONS) ×3
BALLOON LUTONIX AV 7X40X75 (BALLOONS) ×2 IMPLANT
COVER DOME SNAP 22 D (MISCELLANEOUS) ×3 IMPLANT
COVER PRB 48X5XTLSCP FOLD TPE (BAG) ×2 IMPLANT
COVER PROBE 5X48 (BAG) ×1
KIT ENCORE 26 ADVANTAGE (KITS) ×3 IMPLANT
KIT MICROINTRODUCER STIFF 5F (SHEATH) ×3 IMPLANT
PROTECTION STATION PRESSURIZED (MISCELLANEOUS) ×3
SHEATH PINNACLE R/O II 6F 4CM (SHEATH) ×3 IMPLANT
STATION PROTECTION PRESSURIZED (MISCELLANEOUS) ×2 IMPLANT
STOPCOCK MORSE 400PSI 3WAY (MISCELLANEOUS) ×3 IMPLANT
TRAY PV CATH (CUSTOM PROCEDURE TRAY) ×3 IMPLANT
TUBING CIL FLEX 10 FLL-RA (TUBING) ×3 IMPLANT
WIRE BENTSON .035X145CM (WIRE) ×3 IMPLANT

## 2016-09-08 NOTE — Op Note (Signed)
    Patient name: Rachael George MRN: [id number] DOB: February 09, 1956 Sex: female  09/08/2016 Pre-operative Diagnosis: End-stage renal disease Post-operative diagnosis:  Same Surgeon:  Annamarie Major Procedure Performed:  1.  Ultrasound-guided access 2 of the left arm loop graft  2.  Shuntogram  3.  Drug coated balloon angioplasty of the venous outflow tract  4.  Conscious sedation (29 minutes)   Indications:  The patient is having difficulty with flow rates.  She is here today for evaluation  Procedure:  The patient was identified in the holding area and taken to room 8.  The patient was then placed supine on the table and prepped and draped in the usual sterile fashion.  A time out was called.  Conscious sedation was administered with the use of IV fentanyl and Versed under continuous physician and nurse monitoring.  Heart rate, blood pressure, and oxygen saturation were continues to monitor.  Ultrasound was used to evaluate the fistula.  The vein was patent and compressible.  A digital ultrasound image was acquired.  The fistula was then accessed under ultrasound guidance using a micropuncture needle.  An 018 wire was then asvanced without resistance and a micropuncture sheath was placed.  Contrast injections were then performed through the sheath.  Findings:  The central venous stenosis.  The arterial anastomosis is widely patent.  There is moderate luminal narrowing at the venous outflow track approximate 40-50 percent   Intervention:  Based and the above images, I elected to proceed with intervention.  I had to get a second access obtained towards the venous outflow tract.  This was done under ultrasound guidance using a micropuncture needle.  I was ultimately able to get a 035 wire across the venous anastomosis and a 6 French sheath was placed.  I selected a 7 x 40 drug coated Lutonix balloon and perform balloon angioplasty taking the balloon to nominal pressure for 2 minutes.  Completion  imaging revealed no significant residual stenosis.  Both sheaths were removed with suture closure.  There were no complications  Impression:  #1  Successful drug coated balloon and plasty of the venous outflow tract using a 7 x 40 Lutonix balloon  #2  no significant central venous stenosis  #3  no significant arterial anastomotic stenosis    V. Annamarie Major, M.D. [facility] [place] Office: [phone] Pager:  [phone]

## 2016-09-08 NOTE — Discharge Instructions (Signed)

## 2016-09-08 NOTE — Progress Notes (Signed)
Positive thrill

## 2016-09-08 NOTE — H&P (Signed)
   Patient name: Rachael George MRN: [id number] DOB: [birth year] Sex: female    HISTORY OF PRESENT ILLNESS:   Rachael George is a 61 y.o. female who is having trouble with HD access.  CURRENT MEDICATIONS:    Current Facility-Administered Medications  Medication Dose Route Frequency Provider Last Rate Last Dose  . sodium chloride flush (NS) 0.9 % injection 3 mL  3 mL Intravenous PRN Serafina Mitchell, MD        REVIEW OF SYSTEMS:   [X]  denotes positive finding, [ ]  denotes negative finding Cardiac  Comments:  Chest pain or chest pressure:    Shortness of breath upon exertion:    Short of breath when lying flat:    Irregular heart rhythm:    Constitutional    Fever or chills:      PHYSICAL EXAM:   Vitals:   09/08/16 0752  BP: (!) 169/103  Pulse: 93  Temp: 98.1 F (36.7 C)  TempSrc: Oral  SpO2: 100%  Weight: 172 lb (78 kg)  Height: 5\' 2"  (1.575 m)    GENERAL: The patient is a well-nourished female, in no acute distress. The vital signs are documented above. CARDIOVASCULAR: There is a regular rate and rhythm. PULMONARY: Non-labored respirations   STUDIES:      MEDICAL ISSUES:   Plan for shuntogram with intervention as indicated.   Risks and benefits discussed with patient  Annamarie Major, MD [facility] [phone] Pager [phone]

## 2016-09-09 DIAGNOSIS — D631 Anemia in chronic kidney disease: Secondary | ICD-10-CM | POA: Diagnosis not present

## 2016-09-09 DIAGNOSIS — E1129 Type 2 diabetes mellitus with other diabetic kidney complication: Secondary | ICD-10-CM | POA: Diagnosis not present

## 2016-09-09 DIAGNOSIS — D509 Iron deficiency anemia, unspecified: Secondary | ICD-10-CM | POA: Diagnosis not present

## 2016-09-09 DIAGNOSIS — N2581 Secondary hyperparathyroidism of renal origin: Secondary | ICD-10-CM | POA: Diagnosis not present

## 2016-09-09 DIAGNOSIS — N186 End stage renal disease: Secondary | ICD-10-CM | POA: Diagnosis not present

## 2016-09-14 DIAGNOSIS — N186 End stage renal disease: Secondary | ICD-10-CM | POA: Diagnosis not present

## 2016-09-14 DIAGNOSIS — E1129 Type 2 diabetes mellitus with other diabetic kidney complication: Secondary | ICD-10-CM | POA: Diagnosis not present

## 2016-09-14 DIAGNOSIS — N2581 Secondary hyperparathyroidism of renal origin: Secondary | ICD-10-CM | POA: Diagnosis not present

## 2016-09-14 DIAGNOSIS — D631 Anemia in chronic kidney disease: Secondary | ICD-10-CM | POA: Diagnosis not present

## 2016-09-14 DIAGNOSIS — D509 Iron deficiency anemia, unspecified: Secondary | ICD-10-CM | POA: Diagnosis not present

## 2016-09-16 DIAGNOSIS — D509 Iron deficiency anemia, unspecified: Secondary | ICD-10-CM | POA: Diagnosis not present

## 2016-09-16 DIAGNOSIS — D631 Anemia in chronic kidney disease: Secondary | ICD-10-CM | POA: Diagnosis not present

## 2016-09-16 DIAGNOSIS — N2581 Secondary hyperparathyroidism of renal origin: Secondary | ICD-10-CM | POA: Diagnosis not present

## 2016-09-16 DIAGNOSIS — E1129 Type 2 diabetes mellitus with other diabetic kidney complication: Secondary | ICD-10-CM | POA: Diagnosis not present

## 2016-09-16 DIAGNOSIS — N186 End stage renal disease: Secondary | ICD-10-CM | POA: Diagnosis not present

## 2016-09-17 ENCOUNTER — Encounter: Payer: Self-pay | Admitting: Family Medicine

## 2016-09-17 ENCOUNTER — Ambulatory Visit: Payer: Medicare Other | Attending: Family Medicine | Admitting: Family Medicine

## 2016-09-17 VITALS — BP 144/83 | HR 89 | Temp 98.9°F | Resp 18 | Ht 62.0 in | Wt 178.0 lb

## 2016-09-17 DIAGNOSIS — Z79899 Other long term (current) drug therapy: Secondary | ICD-10-CM | POA: Insufficient documentation

## 2016-09-17 DIAGNOSIS — L304 Erythema intertrigo: Secondary | ICD-10-CM | POA: Insufficient documentation

## 2016-09-17 DIAGNOSIS — L309 Dermatitis, unspecified: Secondary | ICD-10-CM | POA: Insufficient documentation

## 2016-09-17 DIAGNOSIS — Z79891 Long term (current) use of opiate analgesic: Secondary | ICD-10-CM | POA: Insufficient documentation

## 2016-09-17 DIAGNOSIS — J4520 Mild intermittent asthma, uncomplicated: Secondary | ICD-10-CM | POA: Diagnosis not present

## 2016-09-17 DIAGNOSIS — M1A09X0 Idiopathic chronic gout, multiple sites, without tophus (tophi): Secondary | ICD-10-CM | POA: Diagnosis not present

## 2016-09-17 DIAGNOSIS — Z992 Dependence on renal dialysis: Secondary | ICD-10-CM | POA: Diagnosis not present

## 2016-09-17 DIAGNOSIS — Z9851 Tubal ligation status: Secondary | ICD-10-CM | POA: Diagnosis not present

## 2016-09-17 DIAGNOSIS — I120 Hypertensive chronic kidney disease with stage 5 chronic kidney disease or end stage renal disease: Secondary | ICD-10-CM | POA: Diagnosis not present

## 2016-09-17 DIAGNOSIS — Z87442 Personal history of urinary calculi: Secondary | ICD-10-CM | POA: Diagnosis not present

## 2016-09-17 DIAGNOSIS — K219 Gastro-esophageal reflux disease without esophagitis: Secondary | ICD-10-CM | POA: Insufficient documentation

## 2016-09-17 DIAGNOSIS — I10 Essential (primary) hypertension: Secondary | ICD-10-CM

## 2016-09-17 DIAGNOSIS — N186 End stage renal disease: Secondary | ICD-10-CM | POA: Insufficient documentation

## 2016-09-17 DIAGNOSIS — Z9889 Other specified postprocedural states: Secondary | ICD-10-CM | POA: Diagnosis not present

## 2016-09-17 DIAGNOSIS — R7303 Prediabetes: Secondary | ICD-10-CM | POA: Diagnosis not present

## 2016-09-17 DIAGNOSIS — M069 Rheumatoid arthritis, unspecified: Secondary | ICD-10-CM | POA: Insufficient documentation

## 2016-09-17 DIAGNOSIS — Z90710 Acquired absence of both cervix and uterus: Secondary | ICD-10-CM | POA: Diagnosis not present

## 2016-09-17 DIAGNOSIS — L308 Other specified dermatitis: Secondary | ICD-10-CM | POA: Diagnosis not present

## 2016-09-17 DIAGNOSIS — Z880 Allergy status to penicillin: Secondary | ICD-10-CM | POA: Insufficient documentation

## 2016-09-17 LAB — POCT GLYCOSYLATED HEMOGLOBIN (HGB A1C): Hemoglobin A1C: 5.3

## 2016-09-17 MED ORDER — TRIAMCINOLONE ACETONIDE 0.1 % EX CREA: 1.0000 "application " | TOPICAL_CREAM | Freq: Two times a day (BID) | CUTANEOUS | 1 refills | Status: DC

## 2016-09-17 MED ORDER — CLOTRIMAZOLE 1 % EX CREA: 1.0000 | TOPICAL_CREAM | Freq: Two times a day (BID) | CUTANEOUS | 1 refills | Status: DC

## 2016-09-17 NOTE — Patient Instructions (Signed)

## 2016-09-17 NOTE — Progress Notes (Signed)
Subjective:  Patient ID: Rachael George, female    DOB: [birth year]  Age: 61 y.o. MRN: [id number]  CC: Follow-up   HPI Rachael George is a 61 year old female with a history of prediabetes (A1c 5.3, diet-controlled) hypertension, hyperlipidemia, end-stage renal disease (on hemodialysis Mondays, Wednesdays and Fridays), gout, asthma prediabetes who presents to the clinic for follow-up visit.  She complains of flaring up of eczema on her arms and the back of both knees. She also complains of a rash in the folds of her thyroid which is pruritic. She does not have any creams for this.  She was hospitalized a week ago for drug coated balloon angioplasty of the venous outflow tract of her left AV fistula which she tolerated well. She denies any pain at the site of her AV fistula.. She is currently on the transplant list.  Compliant with her Proventil for asthma and denies any exacerbation flares. She has also not had any recent gout flares. All her medications usually refilled by her nephrologist.   Past Medical History:  Diagnosis Date  . Anemia   . Asthma   . ESRD (end stage renal disease) ([place])    MWF  . GERD (gastroesophageal reflux disease)   . Gout   . 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 Heavy menstrual bleeding   . High cholesterol   . History of blood transfusion 02/2016- last one   1990's- several ones   . History of kidney stones 1980's  . Hyperlipemia 12/07/2012  . Hypertension   . Hypothyroidism   . Kidney stones   . Pre-diabetes   . Prediabetes   . RA (rheumatoid arthritis) ([place])   . Sleep apnea    [place]- have CPAP, not able to use every night     Past Surgical History:  Procedure Laterality Date  . A/V SHUNTOGRAM N/A 09/08/2016   Procedure: A/V Shuntogram - Left Arm AV Graft;  Surgeon: Serafina Mitchell, MD;  Location: [facility];  Service: Cardiovascular;  Laterality: N/A;  . ABDOMINAL HYSTERECTOMY  2000  . AV FISTULA  PLACEMENT Left 05/25/2016   Procedure: INSERTION OF LEFT UPPER ARM ARTERIOVENOUS (AV) LOOP GORE-TEX GRAFT ARM;  Surgeon: Elam Dutch, MD;  Location: [facility];  Service: Vascular;  Laterality: Left;  . BASCILIC VEIN TRANSPOSITION Right 12/06/2014   Procedure: FIRST STAGE BASILIC VEIN TRANSPOSITION - RIGHT;  Surgeon: Serafina Mitchell, MD;  Location: [place];  Service: Vascular;  Laterality: Right;  . BASCILIC VEIN TRANSPOSITION Right 02/07/2015   Procedure: RIGHT ARM SECOND STAGE BASILIC VEIN TRANSPOSITION;  Surgeon: Serafina Mitchell, MD;  Location: [place];  Service: Vascular;  Laterality: Right;  . BASCILIC VEIN TRANSPOSITION Left 04/22/2016   Procedure: FIRST STAGE BASILIC VEIN TRANSPOSITION LEFT ARM;  Surgeon: Conrad Kingsland, MD;  Location: [facility];  Service: Vascular;  Laterality: Left;  . COLONOSCOPY W/ POLYPECTOMY    . ECTOPIC PREGNANCY SURGERY  02/1982  . EXCHANGE OF A DIALYSIS CATHETER Right 04/22/2016   Procedure: EXCHANGE OF A DIALYSIS CATHETER - INSERTION RIGHT INTERNAL JUGULAR & REMOVAL FROM LEFT INTERNAL JUGULAR;  Surgeon: Conrad Almont, MD;  Location: [facility];  Service: Vascular;  Laterality: Right;  . I&D EXTREMITY Right 02/07/2015   Procedure: IRRIGATION AND DEBRIDEMENT RIGHT ARM HEMATOMA;  Surgeon: Serafina Mitchell, MD;  Location: [facility];  Service: Vascular;  Laterality: Right;  . INSERTION OF DIALYSIS CATHETER  03/03/2016  Procedure: INSERTION OF DIALYSIS CATHETER;  Surgeon: Elam Dutch, MD;  Location: [place];  Service: Vascular;;  . left shoulder surgery  08/2005  . LIGATION OF ARTERIOVENOUS  FISTULA  03/03/2016   Procedure: LIGATION OF ARTERIOVENOUS  FISTULA;  Surgeon: Elam Dutch, MD;  Location: [facility];  Service: Vascular;;  . PERIPHERAL VASCULAR BALLOON ANGIOPLASTY  09/08/2016   Procedure: Peripheral Vascular Balloon Angioplasty;  Surgeon: Serafina Mitchell, MD;  Location: [facility];  Service: Cardiovascular;;  left avf  . TEE WITHOUT CARDIOVERSION N/A 03/06/2016   Procedure:  TRANSESOPHAGEAL ECHOCARDIOGRAM (TEE);  Surgeon: Sueanne Margarita, MD;  Location: [facility];  Service: Cardiovascular;  Laterality: N/A;  . TUBAL LIGATION  11/1986    Allergies  Allergen Reactions  . Atacand [Candesartan] Anaphylaxis    Swelling of the mouth and tongue.  Marland Kitchen Lisinopril Anaphylaxis    Swelling of the mouth and tongue.  . Nsaids Anaphylaxis    Swelling of the mouth and tongue.  Marland Kitchen Penicillins Rash    Has patient had a PCN reaction causing immediate rash, facial/tongue/throat swelling, SOB or lightheadedness with hypotension: No Has patient had a PCN reaction causing severe rash involving mucus membranes or skin necrosis: No Has patient had a PCN reaction that required hospitalization No Has patient had a PCN reaction occurring within the last 10 years: No If all of the above answers are "NO", then may proceed with Cephalosporin use.      Outpatient Medications Prior to Visit  Medication Sig Dispense Refill  . acetaminophen (TYLENOL) 500 MG tablet Take 1,000 mg by mouth every 6 (six) hours as needed for mild pain.     Marland Kitchen albuterol (PROVENTIL HFA;VENTOLIN HFA) 108 (90 Base) MCG/ACT inhaler Inhale 2 puffs into the lungs every 6 (six) hours as needed for wheezing or shortness of breath. 1 Inhaler 2  . allopurinol (ZYLOPRIM) 100 MG tablet Take 100 mg by mouth 2 (two) times daily. Reported on 09/02/2015    . amLODipine (NORVASC) 10 MG tablet Take 10 mg by mouth at bedtime.     Marland Kitchen b complex-vitamin c-folic acid (NEPHRO-VITE) 0.8 MG TABS tablet Take 1 tablet by mouth every morning.     . cinacalcet (SENSIPAR) 60 MG tablet Take 60 mg by mouth every other day. In the evening    . colchicine 0.6 MG tablet Take 2 tabs (1.2mg ) at the onset of a gout flare, may repeat 1 tab (0.6mg ) if symptoms persist (Patient taking differently: Take 0.6-1.2 mg by mouth 2 (two) times daily as needed (for gout flare up as instructed). Take 2 tabs (1.2mg ) at the onset of a gout flare, may repeat 1 tab (0.6mg )  if symptoms persist) 30 tablet 2  . cyclobenzaprine (FLEXERIL) 10 MG tablet Take 10 mg by mouth daily as needed. For back pain    . famotidine (PEPCID AC MAXIMUM STRENGTH) 20 MG tablet Take 20 mg by mouth 2 (two) times daily.    . fluticasone (FLOVENT HFA) 110 MCG/ACT inhaler Inhale 1 puff into the lungs 2 (two) times daily. (Patient taking differently: Inhale 1 puff into the lungs at bedtime. ) 1 Inhaler 12  . olmesartan (BENICAR) 20 MG tablet Take 20 mg by mouth daily with lunch.    . pantoprazole (PROTONIX) 40 MG tablet Take 40 mg by mouth 2 (two) times daily.    . sevelamer (RENAGEL) 800 MG tablet Take 1,600-3,200 mg by mouth See admin instructions. 3,200 mg three times a day with meals and 1,600  mg twice a day with snacks    . oxyCODONE-acetaminophen (PERCOCET/ROXICET) 5-325 MG tablet Take 1 tablet by mouth every 4 (four) hours as needed. (Patient not taking: Reported on 06/04/2016) 15 tablet 0   No facility-administered medications prior to visit.     ROS Review of Systems  Constitutional: Negative for activity change, appetite change and fatigue.  HENT: Negative for congestion, sinus pressure and sore throat.   Eyes: Negative for visual disturbance.  Respiratory: Negative for cough, chest tightness, shortness of breath and wheezing.   Cardiovascular: Negative for chest pain and palpitations.  Gastrointestinal: Negative for abdominal distention, abdominal pain and constipation.  Endocrine: Negative for polydipsia.  Genitourinary: Negative for dysuria and frequency.  Musculoskeletal: Negative for arthralgias and back pain.  Skin: Positive for rash.  Neurological: Negative for tremors, light-headedness and numbness.  Hematological: Does not bruise/bleed easily.  Psychiatric/Behavioral: Negative for agitation and behavioral problems.    Objective:  BP (!) 144/83 (BP Location: Right Arm, Patient Position: Sitting, Cuff Size: Normal)   Pulse 89   Temp 98.9 F (37.2 C) (Oral)   Resp  18   Ht 5\' 2"  (1.575 m)   Wt 178 lb (80.7 kg)   SpO2 99%   BMI 32.56 kg/m   BP/Weight 09/17/2016 09/08/2016 2/37/6283  Systolic BP 151 761 607  Diastolic BP 83 87 371  Wt. (Lbs) 178 172 174  BMI 32.56 31.46 31.83      Physical Exam Constitutional: She is oriented to person, place, and time. She appears well-developed and well-nourished. No distress.  HENT:  Head: Normocephalic.  Right Ear: External ear normal.  Left Ear: External ear normal.  Nose: Nose normal.  Mouth/Throat: Oropharynx is clear and moist.  Eyes: Conjunctivae and EOM are normal. Pupils are equal, round, and reactive to light.  Neck: Normal range of motion. No JVD present.  Cardiovascular: Normal rate, regular rhythm, normal heart sounds and intact distal pulses.  Exam reveals no gallop.   No murmur heard. Pulmonary/Chest: Effort normal and breath sounds normal. No respiratory distress. She has no wheezes. She has no rales. She exhibits no tenderness. Right breast exhibits no mass and no tenderness. Left breast exhibits no mass and no tenderness.  Abdominal: Soft. Bowel sounds are normal. She exhibits no distension and no mass. There is no tenderness.  Musculoskeletal: Normal range of motion. She exhibits no edema or tenderness.  left medial arm AV fistula with palpable thrill  Neurological: She is alert and oriented to person, place, and time. She has normal reflexes.  Skin: Hyperpigmented rough rash on lateral aspect of left arm. Fungal rash on bilateral crural folds.  Psychiatric: She has a normal mood and affect.   Lab Results  Component Value Date   HGBA1C 5.3 09/17/2016    Assessment & Plan:   1. Prediabetes A1c of 5.3 - HgB A1c  2. Essential hypertension, benign Slightly elevated We'll make no regimen changes as she is due for dialysis tomorrow to prevent hypotension Continue antihypertensives Low-sodium diet  3. End stage renal disease ([place]) Hemodialysis as per schedule  4. Mild  intermittent asthma without complication Stable No acute flares Continue albuterol MDI when necessary  5. Idiopathic chronic gout of multiple sites without tophus No acute flares Allopurinol for prophylaxis and colchicine for acute gout  6. Other eczema Discussed skin care-use moisturizers, avoid frequent baths and use lukewarm water - triamcinolone cream (KENALOG) 0.1 %; Apply 1 application topically 2 (two) times daily. Applied to eczematous lesions  Dispense: 80  g; Refill: 1  7. Intertrigo Advised that weight loss will help Allow adequate aeration - clotrimazole (LOTRIMIN) 1 % cream; Apply 1 application topically 2 (two) times daily. Apply to inner thighs  Dispense: 60 g; Refill: 1   Meds ordered this encounter  Medications  . triamcinolone cream (KENALOG) 0.1 %    Sig: Apply 1 application topically 2 (two) times daily. Applied to eczematous lesions    Dispense:  80 g    Refill:  1  . clotrimazole (LOTRIMIN) 1 % cream    Sig: Apply 1 application topically 2 (two) times daily. Apply to inner thighs    Dispense:  60 g    Refill:  1    Follow-up: Return in about 6 months (around 03/19/2017) for Follow-up of chronic medical conditions.   Arnoldo Morale MD

## 2016-09-18 DIAGNOSIS — D631 Anemia in chronic kidney disease: Secondary | ICD-10-CM | POA: Diagnosis not present

## 2016-09-18 DIAGNOSIS — E1129 Type 2 diabetes mellitus with other diabetic kidney complication: Secondary | ICD-10-CM | POA: Diagnosis not present

## 2016-09-18 DIAGNOSIS — N2581 Secondary hyperparathyroidism of renal origin: Secondary | ICD-10-CM | POA: Diagnosis not present

## 2016-09-18 DIAGNOSIS — D509 Iron deficiency anemia, unspecified: Secondary | ICD-10-CM | POA: Diagnosis not present

## 2016-09-18 DIAGNOSIS — N186 End stage renal disease: Secondary | ICD-10-CM | POA: Diagnosis not present

## 2016-09-19 DIAGNOSIS — I129 Hypertensive chronic kidney disease with stage 1 through stage 4 chronic kidney disease, or unspecified chronic kidney disease: Secondary | ICD-10-CM | POA: Diagnosis not present

## 2016-09-19 DIAGNOSIS — Z992 Dependence on renal dialysis: Secondary | ICD-10-CM | POA: Diagnosis not present

## 2016-09-19 DIAGNOSIS — N186 End stage renal disease: Secondary | ICD-10-CM | POA: Diagnosis not present

## 2016-09-21 DIAGNOSIS — D509 Iron deficiency anemia, unspecified: Secondary | ICD-10-CM | POA: Diagnosis not present

## 2016-09-21 DIAGNOSIS — N186 End stage renal disease: Secondary | ICD-10-CM | POA: Diagnosis not present

## 2016-09-21 DIAGNOSIS — N2581 Secondary hyperparathyroidism of renal origin: Secondary | ICD-10-CM | POA: Diagnosis not present

## 2016-09-21 DIAGNOSIS — E1129 Type 2 diabetes mellitus with other diabetic kidney complication: Secondary | ICD-10-CM | POA: Diagnosis not present

## 2016-09-23 DIAGNOSIS — N2581 Secondary hyperparathyroidism of renal origin: Secondary | ICD-10-CM | POA: Diagnosis not present

## 2016-09-23 DIAGNOSIS — A4902 Methicillin resistant Staphylococcus aureus infection, unspecified site: Secondary | ICD-10-CM | POA: Diagnosis not present

## 2016-09-23 DIAGNOSIS — E1129 Type 2 diabetes mellitus with other diabetic kidney complication: Secondary | ICD-10-CM | POA: Diagnosis not present

## 2016-09-23 DIAGNOSIS — D509 Iron deficiency anemia, unspecified: Secondary | ICD-10-CM | POA: Diagnosis not present

## 2016-09-23 DIAGNOSIS — N186 End stage renal disease: Secondary | ICD-10-CM | POA: Diagnosis not present

## 2016-09-25 DIAGNOSIS — N186 End stage renal disease: Secondary | ICD-10-CM | POA: Diagnosis not present

## 2016-09-25 DIAGNOSIS — E1129 Type 2 diabetes mellitus with other diabetic kidney complication: Secondary | ICD-10-CM | POA: Diagnosis not present

## 2016-09-25 DIAGNOSIS — D509 Iron deficiency anemia, unspecified: Secondary | ICD-10-CM | POA: Diagnosis not present

## 2016-09-25 DIAGNOSIS — N2581 Secondary hyperparathyroidism of renal origin: Secondary | ICD-10-CM | POA: Diagnosis not present

## 2016-09-28 DIAGNOSIS — N2581 Secondary hyperparathyroidism of renal origin: Secondary | ICD-10-CM | POA: Diagnosis not present

## 2016-09-28 DIAGNOSIS — D509 Iron deficiency anemia, unspecified: Secondary | ICD-10-CM | POA: Diagnosis not present

## 2016-09-28 DIAGNOSIS — E1129 Type 2 diabetes mellitus with other diabetic kidney complication: Secondary | ICD-10-CM | POA: Diagnosis not present

## 2016-09-28 DIAGNOSIS — N186 End stage renal disease: Secondary | ICD-10-CM | POA: Diagnosis not present

## 2016-09-30 DIAGNOSIS — N2581 Secondary hyperparathyroidism of renal origin: Secondary | ICD-10-CM | POA: Diagnosis not present

## 2016-09-30 DIAGNOSIS — N186 End stage renal disease: Secondary | ICD-10-CM | POA: Diagnosis not present

## 2016-09-30 DIAGNOSIS — D509 Iron deficiency anemia, unspecified: Secondary | ICD-10-CM | POA: Diagnosis not present

## 2016-09-30 DIAGNOSIS — E1129 Type 2 diabetes mellitus with other diabetic kidney complication: Secondary | ICD-10-CM | POA: Diagnosis not present

## 2016-10-02 DIAGNOSIS — D509 Iron deficiency anemia, unspecified: Secondary | ICD-10-CM | POA: Diagnosis not present

## 2016-10-02 DIAGNOSIS — E1129 Type 2 diabetes mellitus with other diabetic kidney complication: Secondary | ICD-10-CM | POA: Diagnosis not present

## 2016-10-02 DIAGNOSIS — N2581 Secondary hyperparathyroidism of renal origin: Secondary | ICD-10-CM | POA: Diagnosis not present

## 2016-10-02 DIAGNOSIS — N186 End stage renal disease: Secondary | ICD-10-CM | POA: Diagnosis not present

## 2016-10-05 DIAGNOSIS — E1129 Type 2 diabetes mellitus with other diabetic kidney complication: Secondary | ICD-10-CM | POA: Diagnosis not present

## 2016-10-05 DIAGNOSIS — N2581 Secondary hyperparathyroidism of renal origin: Secondary | ICD-10-CM | POA: Diagnosis not present

## 2016-10-05 DIAGNOSIS — D509 Iron deficiency anemia, unspecified: Secondary | ICD-10-CM | POA: Diagnosis not present

## 2016-10-05 DIAGNOSIS — N186 End stage renal disease: Secondary | ICD-10-CM | POA: Diagnosis not present

## 2016-10-06 DIAGNOSIS — H31092 Other chorioretinal scars, left eye: Secondary | ICD-10-CM | POA: Diagnosis not present

## 2016-10-06 DIAGNOSIS — H35033 Hypertensive retinopathy, bilateral: Secondary | ICD-10-CM | POA: Diagnosis not present

## 2016-10-06 DIAGNOSIS — E119 Type 2 diabetes mellitus without complications: Secondary | ICD-10-CM | POA: Diagnosis not present

## 2016-10-06 DIAGNOSIS — H2513 Age-related nuclear cataract, bilateral: Secondary | ICD-10-CM | POA: Diagnosis not present

## 2016-10-06 DIAGNOSIS — H3562 Retinal hemorrhage, left eye: Secondary | ICD-10-CM | POA: Diagnosis not present

## 2016-10-07 DIAGNOSIS — N186 End stage renal disease: Secondary | ICD-10-CM | POA: Diagnosis not present

## 2016-10-07 DIAGNOSIS — D509 Iron deficiency anemia, unspecified: Secondary | ICD-10-CM | POA: Diagnosis not present

## 2016-10-07 DIAGNOSIS — E1129 Type 2 diabetes mellitus with other diabetic kidney complication: Secondary | ICD-10-CM | POA: Diagnosis not present

## 2016-10-07 DIAGNOSIS — N2581 Secondary hyperparathyroidism of renal origin: Secondary | ICD-10-CM | POA: Diagnosis not present

## 2016-10-09 DIAGNOSIS — N2581 Secondary hyperparathyroidism of renal origin: Secondary | ICD-10-CM | POA: Diagnosis not present

## 2016-10-09 DIAGNOSIS — D509 Iron deficiency anemia, unspecified: Secondary | ICD-10-CM | POA: Diagnosis not present

## 2016-10-09 DIAGNOSIS — E1129 Type 2 diabetes mellitus with other diabetic kidney complication: Secondary | ICD-10-CM | POA: Diagnosis not present

## 2016-10-09 DIAGNOSIS — N186 End stage renal disease: Secondary | ICD-10-CM | POA: Diagnosis not present

## 2016-10-12 DIAGNOSIS — N2581 Secondary hyperparathyroidism of renal origin: Secondary | ICD-10-CM | POA: Diagnosis not present

## 2016-10-12 DIAGNOSIS — E1129 Type 2 diabetes mellitus with other diabetic kidney complication: Secondary | ICD-10-CM | POA: Diagnosis not present

## 2016-10-12 DIAGNOSIS — D509 Iron deficiency anemia, unspecified: Secondary | ICD-10-CM | POA: Diagnosis not present

## 2016-10-12 DIAGNOSIS — N186 End stage renal disease: Secondary | ICD-10-CM | POA: Diagnosis not present

## 2016-10-14 DIAGNOSIS — D509 Iron deficiency anemia, unspecified: Secondary | ICD-10-CM | POA: Diagnosis not present

## 2016-10-14 DIAGNOSIS — N2581 Secondary hyperparathyroidism of renal origin: Secondary | ICD-10-CM | POA: Diagnosis not present

## 2016-10-14 DIAGNOSIS — E1129 Type 2 diabetes mellitus with other diabetic kidney complication: Secondary | ICD-10-CM | POA: Diagnosis not present

## 2016-10-14 DIAGNOSIS — N186 End stage renal disease: Secondary | ICD-10-CM | POA: Diagnosis not present

## 2016-10-16 DIAGNOSIS — N186 End stage renal disease: Secondary | ICD-10-CM | POA: Diagnosis not present

## 2016-10-16 DIAGNOSIS — E1129 Type 2 diabetes mellitus with other diabetic kidney complication: Secondary | ICD-10-CM | POA: Diagnosis not present

## 2016-10-16 DIAGNOSIS — N2581 Secondary hyperparathyroidism of renal origin: Secondary | ICD-10-CM | POA: Diagnosis not present

## 2016-10-16 DIAGNOSIS — D509 Iron deficiency anemia, unspecified: Secondary | ICD-10-CM | POA: Diagnosis not present

## 2016-10-19 DIAGNOSIS — N186 End stage renal disease: Secondary | ICD-10-CM | POA: Diagnosis not present

## 2016-10-19 DIAGNOSIS — D509 Iron deficiency anemia, unspecified: Secondary | ICD-10-CM | POA: Diagnosis not present

## 2016-10-19 DIAGNOSIS — E1129 Type 2 diabetes mellitus with other diabetic kidney complication: Secondary | ICD-10-CM | POA: Diagnosis not present

## 2016-10-19 DIAGNOSIS — N2581 Secondary hyperparathyroidism of renal origin: Secondary | ICD-10-CM | POA: Diagnosis not present

## 2016-10-20 DIAGNOSIS — N186 End stage renal disease: Secondary | ICD-10-CM | POA: Diagnosis not present

## 2016-10-20 DIAGNOSIS — I129 Hypertensive chronic kidney disease with stage 1 through stage 4 chronic kidney disease, or unspecified chronic kidney disease: Secondary | ICD-10-CM | POA: Diagnosis not present

## 2016-10-20 DIAGNOSIS — Z992 Dependence on renal dialysis: Secondary | ICD-10-CM | POA: Diagnosis not present

## 2016-10-21 DIAGNOSIS — N186 End stage renal disease: Secondary | ICD-10-CM | POA: Diagnosis not present

## 2016-10-21 DIAGNOSIS — E1129 Type 2 diabetes mellitus with other diabetic kidney complication: Secondary | ICD-10-CM | POA: Diagnosis not present

## 2016-10-21 DIAGNOSIS — D509 Iron deficiency anemia, unspecified: Secondary | ICD-10-CM | POA: Diagnosis not present

## 2016-10-21 DIAGNOSIS — D631 Anemia in chronic kidney disease: Secondary | ICD-10-CM | POA: Diagnosis not present

## 2016-10-21 DIAGNOSIS — N2581 Secondary hyperparathyroidism of renal origin: Secondary | ICD-10-CM | POA: Diagnosis not present

## 2016-10-26 DIAGNOSIS — N2581 Secondary hyperparathyroidism of renal origin: Secondary | ICD-10-CM | POA: Diagnosis not present

## 2016-10-26 DIAGNOSIS — N186 End stage renal disease: Secondary | ICD-10-CM | POA: Diagnosis not present

## 2016-10-26 DIAGNOSIS — D509 Iron deficiency anemia, unspecified: Secondary | ICD-10-CM | POA: Diagnosis not present

## 2016-10-26 DIAGNOSIS — E1129 Type 2 diabetes mellitus with other diabetic kidney complication: Secondary | ICD-10-CM | POA: Diagnosis not present

## 2016-10-26 DIAGNOSIS — D631 Anemia in chronic kidney disease: Secondary | ICD-10-CM | POA: Diagnosis not present

## 2016-10-28 DIAGNOSIS — D509 Iron deficiency anemia, unspecified: Secondary | ICD-10-CM | POA: Diagnosis not present

## 2016-10-28 DIAGNOSIS — E1129 Type 2 diabetes mellitus with other diabetic kidney complication: Secondary | ICD-10-CM | POA: Diagnosis not present

## 2016-10-28 DIAGNOSIS — N2581 Secondary hyperparathyroidism of renal origin: Secondary | ICD-10-CM | POA: Diagnosis not present

## 2016-10-28 DIAGNOSIS — D631 Anemia in chronic kidney disease: Secondary | ICD-10-CM | POA: Diagnosis not present

## 2016-10-28 DIAGNOSIS — N186 End stage renal disease: Secondary | ICD-10-CM | POA: Diagnosis not present

## 2016-10-30 DIAGNOSIS — D631 Anemia in chronic kidney disease: Secondary | ICD-10-CM | POA: Diagnosis not present

## 2016-10-30 DIAGNOSIS — N2581 Secondary hyperparathyroidism of renal origin: Secondary | ICD-10-CM | POA: Diagnosis not present

## 2016-10-30 DIAGNOSIS — E1129 Type 2 diabetes mellitus with other diabetic kidney complication: Secondary | ICD-10-CM | POA: Diagnosis not present

## 2016-10-30 DIAGNOSIS — D509 Iron deficiency anemia, unspecified: Secondary | ICD-10-CM | POA: Diagnosis not present

## 2016-10-30 DIAGNOSIS — N186 End stage renal disease: Secondary | ICD-10-CM | POA: Diagnosis not present

## 2016-11-02 DIAGNOSIS — D509 Iron deficiency anemia, unspecified: Secondary | ICD-10-CM | POA: Diagnosis not present

## 2016-11-02 DIAGNOSIS — N186 End stage renal disease: Secondary | ICD-10-CM | POA: Diagnosis not present

## 2016-11-02 DIAGNOSIS — E1129 Type 2 diabetes mellitus with other diabetic kidney complication: Secondary | ICD-10-CM | POA: Diagnosis not present

## 2016-11-02 DIAGNOSIS — D631 Anemia in chronic kidney disease: Secondary | ICD-10-CM | POA: Diagnosis not present

## 2016-11-02 DIAGNOSIS — N2581 Secondary hyperparathyroidism of renal origin: Secondary | ICD-10-CM | POA: Diagnosis not present

## 2016-11-04 DIAGNOSIS — N2581 Secondary hyperparathyroidism of renal origin: Secondary | ICD-10-CM | POA: Diagnosis not present

## 2016-11-04 DIAGNOSIS — D509 Iron deficiency anemia, unspecified: Secondary | ICD-10-CM | POA: Diagnosis not present

## 2016-11-04 DIAGNOSIS — E1129 Type 2 diabetes mellitus with other diabetic kidney complication: Secondary | ICD-10-CM | POA: Diagnosis not present

## 2016-11-04 DIAGNOSIS — N186 End stage renal disease: Secondary | ICD-10-CM | POA: Diagnosis not present

## 2016-11-04 DIAGNOSIS — D631 Anemia in chronic kidney disease: Secondary | ICD-10-CM | POA: Diagnosis not present

## 2016-11-06 DIAGNOSIS — D631 Anemia in chronic kidney disease: Secondary | ICD-10-CM | POA: Diagnosis not present

## 2016-11-06 DIAGNOSIS — D509 Iron deficiency anemia, unspecified: Secondary | ICD-10-CM | POA: Diagnosis not present

## 2016-11-06 DIAGNOSIS — N2581 Secondary hyperparathyroidism of renal origin: Secondary | ICD-10-CM | POA: Diagnosis not present

## 2016-11-06 DIAGNOSIS — E1129 Type 2 diabetes mellitus with other diabetic kidney complication: Secondary | ICD-10-CM | POA: Diagnosis not present

## 2016-11-06 DIAGNOSIS — N186 End stage renal disease: Secondary | ICD-10-CM | POA: Diagnosis not present

## 2016-11-09 DIAGNOSIS — N186 End stage renal disease: Secondary | ICD-10-CM | POA: Diagnosis not present

## 2016-11-09 DIAGNOSIS — E1129 Type 2 diabetes mellitus with other diabetic kidney complication: Secondary | ICD-10-CM | POA: Diagnosis not present

## 2016-11-09 DIAGNOSIS — D509 Iron deficiency anemia, unspecified: Secondary | ICD-10-CM | POA: Diagnosis not present

## 2016-11-09 DIAGNOSIS — D631 Anemia in chronic kidney disease: Secondary | ICD-10-CM | POA: Diagnosis not present

## 2016-11-09 DIAGNOSIS — N2581 Secondary hyperparathyroidism of renal origin: Secondary | ICD-10-CM | POA: Diagnosis not present

## 2016-11-11 DIAGNOSIS — E1129 Type 2 diabetes mellitus with other diabetic kidney complication: Secondary | ICD-10-CM | POA: Diagnosis not present

## 2016-11-11 DIAGNOSIS — N2581 Secondary hyperparathyroidism of renal origin: Secondary | ICD-10-CM | POA: Diagnosis not present

## 2016-11-11 DIAGNOSIS — D509 Iron deficiency anemia, unspecified: Secondary | ICD-10-CM | POA: Diagnosis not present

## 2016-11-11 DIAGNOSIS — N186 End stage renal disease: Secondary | ICD-10-CM | POA: Diagnosis not present

## 2016-11-11 DIAGNOSIS — D631 Anemia in chronic kidney disease: Secondary | ICD-10-CM | POA: Diagnosis not present

## 2016-11-13 DIAGNOSIS — N2581 Secondary hyperparathyroidism of renal origin: Secondary | ICD-10-CM | POA: Diagnosis not present

## 2016-11-13 DIAGNOSIS — D631 Anemia in chronic kidney disease: Secondary | ICD-10-CM | POA: Diagnosis not present

## 2016-11-13 DIAGNOSIS — D509 Iron deficiency anemia, unspecified: Secondary | ICD-10-CM | POA: Diagnosis not present

## 2016-11-13 DIAGNOSIS — E1129 Type 2 diabetes mellitus with other diabetic kidney complication: Secondary | ICD-10-CM | POA: Diagnosis not present

## 2016-11-13 DIAGNOSIS — N186 End stage renal disease: Secondary | ICD-10-CM | POA: Diagnosis not present

## 2016-11-16 DIAGNOSIS — N2581 Secondary hyperparathyroidism of renal origin: Secondary | ICD-10-CM | POA: Diagnosis not present

## 2016-11-16 DIAGNOSIS — D509 Iron deficiency anemia, unspecified: Secondary | ICD-10-CM | POA: Diagnosis not present

## 2016-11-16 DIAGNOSIS — D631 Anemia in chronic kidney disease: Secondary | ICD-10-CM | POA: Diagnosis not present

## 2016-11-16 DIAGNOSIS — E1129 Type 2 diabetes mellitus with other diabetic kidney complication: Secondary | ICD-10-CM | POA: Diagnosis not present

## 2016-11-16 DIAGNOSIS — N186 End stage renal disease: Secondary | ICD-10-CM | POA: Diagnosis not present

## 2016-11-18 DIAGNOSIS — D631 Anemia in chronic kidney disease: Secondary | ICD-10-CM | POA: Diagnosis not present

## 2016-11-18 DIAGNOSIS — N2581 Secondary hyperparathyroidism of renal origin: Secondary | ICD-10-CM | POA: Diagnosis not present

## 2016-11-18 DIAGNOSIS — N186 End stage renal disease: Secondary | ICD-10-CM | POA: Diagnosis not present

## 2016-11-18 DIAGNOSIS — E1129 Type 2 diabetes mellitus with other diabetic kidney complication: Secondary | ICD-10-CM | POA: Diagnosis not present

## 2016-11-18 DIAGNOSIS — D509 Iron deficiency anemia, unspecified: Secondary | ICD-10-CM | POA: Diagnosis not present

## 2016-11-20 DIAGNOSIS — I129 Hypertensive chronic kidney disease with stage 1 through stage 4 chronic kidney disease, or unspecified chronic kidney disease: Secondary | ICD-10-CM | POA: Diagnosis not present

## 2016-11-20 DIAGNOSIS — E1129 Type 2 diabetes mellitus with other diabetic kidney complication: Secondary | ICD-10-CM | POA: Diagnosis not present

## 2016-11-20 DIAGNOSIS — D631 Anemia in chronic kidney disease: Secondary | ICD-10-CM | POA: Diagnosis not present

## 2016-11-20 DIAGNOSIS — D509 Iron deficiency anemia, unspecified: Secondary | ICD-10-CM | POA: Diagnosis not present

## 2016-11-20 DIAGNOSIS — N186 End stage renal disease: Secondary | ICD-10-CM | POA: Diagnosis not present

## 2016-11-20 DIAGNOSIS — N2581 Secondary hyperparathyroidism of renal origin: Secondary | ICD-10-CM | POA: Diagnosis not present

## 2016-11-20 DIAGNOSIS — Z992 Dependence on renal dialysis: Secondary | ICD-10-CM | POA: Diagnosis not present

## 2016-11-23 DIAGNOSIS — D509 Iron deficiency anemia, unspecified: Secondary | ICD-10-CM | POA: Diagnosis not present

## 2016-11-23 DIAGNOSIS — N186 End stage renal disease: Secondary | ICD-10-CM | POA: Diagnosis not present

## 2016-11-23 DIAGNOSIS — E1129 Type 2 diabetes mellitus with other diabetic kidney complication: Secondary | ICD-10-CM | POA: Diagnosis not present

## 2016-11-23 DIAGNOSIS — D631 Anemia in chronic kidney disease: Secondary | ICD-10-CM | POA: Diagnosis not present

## 2016-11-23 DIAGNOSIS — N2581 Secondary hyperparathyroidism of renal origin: Secondary | ICD-10-CM | POA: Diagnosis not present

## 2016-11-23 DIAGNOSIS — Z23 Encounter for immunization: Secondary | ICD-10-CM | POA: Diagnosis not present

## 2016-11-27 DIAGNOSIS — Z23 Encounter for immunization: Secondary | ICD-10-CM | POA: Diagnosis not present

## 2016-11-27 DIAGNOSIS — N2581 Secondary hyperparathyroidism of renal origin: Secondary | ICD-10-CM | POA: Diagnosis not present

## 2016-11-27 DIAGNOSIS — D631 Anemia in chronic kidney disease: Secondary | ICD-10-CM | POA: Diagnosis not present

## 2016-11-27 DIAGNOSIS — D509 Iron deficiency anemia, unspecified: Secondary | ICD-10-CM | POA: Diagnosis not present

## 2016-11-27 DIAGNOSIS — E1129 Type 2 diabetes mellitus with other diabetic kidney complication: Secondary | ICD-10-CM | POA: Diagnosis not present

## 2016-11-27 DIAGNOSIS — N186 End stage renal disease: Secondary | ICD-10-CM | POA: Diagnosis not present

## 2016-11-30 DIAGNOSIS — Z23 Encounter for immunization: Secondary | ICD-10-CM | POA: Diagnosis not present

## 2016-11-30 DIAGNOSIS — D631 Anemia in chronic kidney disease: Secondary | ICD-10-CM | POA: Diagnosis not present

## 2016-11-30 DIAGNOSIS — E1129 Type 2 diabetes mellitus with other diabetic kidney complication: Secondary | ICD-10-CM | POA: Diagnosis not present

## 2016-11-30 DIAGNOSIS — D509 Iron deficiency anemia, unspecified: Secondary | ICD-10-CM | POA: Diagnosis not present

## 2016-11-30 DIAGNOSIS — N186 End stage renal disease: Secondary | ICD-10-CM | POA: Diagnosis not present

## 2016-11-30 DIAGNOSIS — N2581 Secondary hyperparathyroidism of renal origin: Secondary | ICD-10-CM | POA: Diagnosis not present

## 2016-12-01 ENCOUNTER — Encounter: Payer: Self-pay | Admitting: Family Medicine

## 2016-12-01 ENCOUNTER — Ambulatory Visit: Payer: Medicare Other | Attending: Family Medicine | Admitting: Family Medicine

## 2016-12-01 VITALS — BP 140/90 | HR 87 | Temp 98.5°F | Wt 181.4 lb

## 2016-12-01 DIAGNOSIS — J329 Chronic sinusitis, unspecified: Secondary | ICD-10-CM | POA: Diagnosis not present

## 2016-12-01 DIAGNOSIS — R7303 Prediabetes: Secondary | ICD-10-CM | POA: Diagnosis not present

## 2016-12-01 DIAGNOSIS — G8929 Other chronic pain: Secondary | ICD-10-CM

## 2016-12-01 DIAGNOSIS — Z992 Dependence on renal dialysis: Secondary | ICD-10-CM | POA: Insufficient documentation

## 2016-12-01 DIAGNOSIS — N186 End stage renal disease: Secondary | ICD-10-CM | POA: Diagnosis not present

## 2016-12-01 DIAGNOSIS — I120 Hypertensive chronic kidney disease with stage 5 chronic kidney disease or end stage renal disease: Secondary | ICD-10-CM | POA: Insufficient documentation

## 2016-12-01 DIAGNOSIS — E785 Hyperlipidemia, unspecified: Secondary | ICD-10-CM | POA: Diagnosis not present

## 2016-12-01 DIAGNOSIS — E039 Hypothyroidism, unspecified: Secondary | ICD-10-CM | POA: Diagnosis not present

## 2016-12-01 DIAGNOSIS — R059 Cough, unspecified: Secondary | ICD-10-CM

## 2016-12-01 DIAGNOSIS — I10 Essential (primary) hypertension: Secondary | ICD-10-CM | POA: Diagnosis not present

## 2016-12-01 DIAGNOSIS — E7800 Pure hypercholesterolemia, unspecified: Secondary | ICD-10-CM | POA: Diagnosis not present

## 2016-12-01 DIAGNOSIS — Z880 Allergy status to penicillin: Secondary | ICD-10-CM | POA: Insufficient documentation

## 2016-12-01 DIAGNOSIS — Z79899 Other long term (current) drug therapy: Secondary | ICD-10-CM | POA: Insufficient documentation

## 2016-12-01 DIAGNOSIS — M545 Low back pain, unspecified: Secondary | ICD-10-CM

## 2016-12-01 DIAGNOSIS — R05 Cough: Secondary | ICD-10-CM | POA: Diagnosis not present

## 2016-12-01 DIAGNOSIS — M1A09X0 Idiopathic chronic gout, multiple sites, without tophus (tophi): Secondary | ICD-10-CM | POA: Insufficient documentation

## 2016-12-01 DIAGNOSIS — K219 Gastro-esophageal reflux disease without esophagitis: Secondary | ICD-10-CM | POA: Diagnosis not present

## 2016-12-01 DIAGNOSIS — Z888 Allergy status to other drugs, medicaments and biological substances status: Secondary | ICD-10-CM | POA: Insufficient documentation

## 2016-12-01 DIAGNOSIS — G629 Polyneuropathy, unspecified: Secondary | ICD-10-CM | POA: Diagnosis not present

## 2016-12-01 DIAGNOSIS — J4520 Mild intermittent asthma, uncomplicated: Secondary | ICD-10-CM | POA: Diagnosis not present

## 2016-12-01 DIAGNOSIS — J45909 Unspecified asthma, uncomplicated: Secondary | ICD-10-CM | POA: Insufficient documentation

## 2016-12-01 DIAGNOSIS — G4730 Sleep apnea, unspecified: Secondary | ICD-10-CM | POA: Diagnosis not present

## 2016-12-01 DIAGNOSIS — Z886 Allergy status to analgesic agent status: Secondary | ICD-10-CM | POA: Insufficient documentation

## 2016-12-01 MED ORDER — CETIRIZINE HCL 10 MG PO TABS: 10.0000 mg | ORAL_TABLET | Freq: Every day | ORAL | 1 refills | Status: DC

## 2016-12-01 MED ORDER — BENZONATATE 100 MG PO CAPS: 100.0000 mg | ORAL_CAPSULE | Freq: Three times a day (TID) | ORAL | 0 refills | Status: DC | PRN

## 2016-12-01 MED ORDER — CYCLOBENZAPRINE HCL 10 MG PO TABS: 10.0000 mg | ORAL_TABLET | Freq: Two times a day (BID) | ORAL | 1 refills | Status: DC | PRN

## 2016-12-01 MED ORDER — GABAPENTIN 300 MG PO CAPS: 300.0000 mg | ORAL_CAPSULE | Freq: Two times a day (BID) | ORAL | 3 refills | Status: DC

## 2016-12-01 NOTE — Progress Notes (Signed)
Subjective:  Patient ID: Rachael George, female    DOB: [birth year]  Age: 61 y.o. MRN: [id number]  CC: Back Pain and Asthma   HPI CAELYNN MARSHMAN is a 61 year old female with a history of prediabetes (A1c 5.3 from 08/2016 diet-controlled) hypertension, hyperlipidemia, end-stage renal disease (on hemodialysis Mondays, Wednesdays and Fridays), gout, asthma prediabetes who presents to the clinic for  a follow-up visit.  She complains of right-sided low back pain which causes her to roll out of bed in the morning and makes it difficult to sit for several hours for her dialysis sessions. Flexeril has not helped her symptoms. Denies radiation of pain or loss of sphincteric function but does endorse tingling in feet.  She has also had a dry cough which she describes as a tickle in her throat, is worse in the mornings, improves by the afternoon only to return around 7 PM. She has had episodes of needing oxygen during dialysis due to symptoms. She currently has asthma which is controlled on Flovent and her rescue inhaler and denies any wheezing; she states this does not feel like a flareup of her asthma.   Past Medical History:  Diagnosis Date  . Anemia   . Asthma   . ESRD (end stage renal disease) ([place])    MWF  . GERD (gastroesophageal reflux disease)   . Gout   . 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 Heavy menstrual bleeding   . High cholesterol   . History of blood transfusion 02/2016- last one   1990's- several ones   . History of kidney stones 1980's  . Hyperlipemia 12/07/2012  . Hypertension   . Hypothyroidism   . Kidney stones   . Pre-diabetes   . Prediabetes   . RA (rheumatoid arthritis) ([place])   . Sleep apnea    [place]- have CPAP, not able to use every night     Past Surgical History:  Procedure Laterality Date  . A/V SHUNTOGRAM N/A 09/08/2016   Procedure: A/V Shuntogram - Left Arm AV Graft;  Surgeon: Serafina Mitchell, MD;  Location: [facility];  Service: Cardiovascular;  Laterality: N/A;  . ABDOMINAL HYSTERECTOMY  2000  . AV FISTULA PLACEMENT Left 05/25/2016   Procedure: INSERTION OF LEFT UPPER ARM ARTERIOVENOUS (AV) LOOP GORE-TEX GRAFT ARM;  Surgeon: Elam Dutch, MD;  Location: [facility];  Service: Vascular;  Laterality: Left;  . BASCILIC VEIN TRANSPOSITION Right 12/06/2014   Procedure: FIRST STAGE BASILIC VEIN TRANSPOSITION - RIGHT;  Surgeon: Serafina Mitchell, MD;  Location: [facility];  Service: Vascular;  Laterality: Right;  . BASCILIC VEIN TRANSPOSITION Right 02/07/2015   Procedure: RIGHT ARM SECOND STAGE BASILIC VEIN TRANSPOSITION;  Surgeon: Serafina Mitchell, MD;  Location: [facility];  Service: Vascular;  Laterality: Right;  . BASCILIC VEIN TRANSPOSITION Left 04/22/2016   Procedure: FIRST STAGE BASILIC VEIN TRANSPOSITION LEFT ARM;  Surgeon: Conrad [place], MD;  Location: [facility];  Service: Vascular;  Laterality: Left;  . COLONOSCOPY W/ POLYPECTOMY    . ECTOPIC PREGNANCY SURGERY  02/1982  . EXCHANGE OF A DIALYSIS CATHETER Right 04/22/2016   Procedure: EXCHANGE OF A DIALYSIS CATHETER - INSERTION RIGHT INTERNAL JUGULAR & REMOVAL FROM LEFT INTERNAL JUGULAR;  Surgeon: Conrad [place], MD;  Location: [facility];  Service: Vascular;  Laterality: Right;  . I&D EXTREMITY Right 02/07/2015   Procedure: IRRIGATION AND DEBRIDEMENT RIGHT ARM HEMATOMA;  Surgeon: Serafina Mitchell, MD;  [facility];  Service: Vascular;  Laterality: Right;  . INSERTION OF DIALYSIS CATHETER  03/03/2016   Procedure: INSERTION OF DIALYSIS CATHETER;  Surgeon: Elam Dutch, MD;  Location: [facility];  Service: Vascular;;  . left shoulder surgery  08/2005  . LIGATION OF ARTERIOVENOUS  FISTULA  03/03/2016   Procedure: LIGATION OF ARTERIOVENOUS  FISTULA;  Surgeon: Elam Dutch, MD;  Location: [facility];  Service: Vascular;;  . PERIPHERAL VASCULAR BALLOON ANGIOPLASTY  09/08/2016   Procedure: Peripheral Vascular Balloon Angioplasty;  Surgeon: Serafina Mitchell, MD;  Location: [facility];   Service: Cardiovascular;;  left avf  . TEE WITHOUT CARDIOVERSION N/A 03/06/2016   Procedure: TRANSESOPHAGEAL ECHOCARDIOGRAM (TEE);  Surgeon: Sueanne Margarita, MD;  Location: [facility];  Service: Cardiovascular;  Laterality: N/A;  . TUBAL LIGATION  11/1986    Allergies  Allergen Reactions  . Atacand [Candesartan] Anaphylaxis    Swelling of the mouth and tongue.  Marland Kitchen Lisinopril Anaphylaxis    Swelling of the mouth and tongue.  . Nsaids Anaphylaxis    Swelling of the mouth and tongue.  Marland Kitchen Penicillins Rash    Has patient had a PCN reaction causing immediate rash, facial/tongue/throat swelling, SOB or lightheadedness with hypotension: No Has patient had a PCN reaction causing severe rash involving mucus membranes or skin necrosis: No Has patient had a PCN reaction that required hospitalization No Has patient had a PCN reaction occurring within the last 10 years: No If all of the above answers are "NO", then may proceed with Cephalosporin use.      Outpatient Medications Prior to Visit  Medication Sig Dispense Refill  . acetaminophen (TYLENOL) 500 MG tablet Take 1,000 mg by mouth every 6 (six) hours as needed for mild pain.     Marland Kitchen albuterol (PROVENTIL HFA;VENTOLIN HFA) 108 (90 Base) MCG/ACT inhaler Inhale 2 puffs into the lungs every 6 (six) hours as needed for wheezing or shortness of breath. 1 Inhaler 2  . allopurinol (ZYLOPRIM) 100 MG tablet Take 100 mg by mouth 2 (two) times daily. Reported on 09/02/2015    . amLODipine (NORVASC) 10 MG tablet Take 10 mg by mouth at bedtime.     Marland Kitchen b complex-vitamin c-folic acid (NEPHRO-VITE) 0.8 MG TABS tablet Take 1 tablet by mouth every morning.     . cinacalcet (SENSIPAR) 60 MG tablet Take 60 mg by mouth every other day. In the evening    . clotrimazole (LOTRIMIN) 1 % cream Apply 1 application topically 2 (two) times daily. Apply to inner thighs 60 g 1  . colchicine 0.6 MG tablet Take 2 tabs (1.2mg ) at the onset of a gout flare, may repeat 1 tab (0.6mg )  if symptoms persist (Patient taking differently: Take 0.6-1.2 mg by mouth 2 (two) times daily as needed (for gout flare up as instructed). Take 2 tabs (1.2mg ) at the onset of a gout flare, may repeat 1 tab (0.6mg ) if symptoms persist) 30 tablet 2  . famotidine (PEPCID AC MAXIMUM STRENGTH) 20 MG tablet Take 20 mg by mouth 2 (two) times daily.    . fluticasone (FLOVENT HFA) 110 MCG/ACT inhaler Inhale 1 puff into the lungs 2 (two) times daily. (Patient taking differently: Inhale 1 puff into the lungs at bedtime. ) 1 Inhaler 12  . olmesartan (BENICAR) 20 MG tablet Take 20 mg by mouth daily with lunch.    . pantoprazole (PROTONIX) 40 MG tablet Take 40 mg by mouth 2 (two) times daily.    . sevelamer (RENAGEL)  800 MG tablet Take 1,600-3,200 mg by mouth See admin instructions. 3,200 mg three times a day with meals and 1,600 mg twice a day with snacks    . triamcinolone cream (KENALOG) 0.1 % Apply 1 application topically 2 (two) times daily. Applied to eczematous lesions 80 g 1  . cyclobenzaprine (FLEXERIL) 10 MG tablet Take 10 mg by mouth daily as needed. For back pain     No facility-administered medications prior to visit.     ROS Review of Systems  Constitutional: Negative for activity change, appetite change and fatigue.  HENT: Positive for postnasal drip. Negative for congestion, sinus pressure and sore throat.   Eyes: Negative for visual disturbance.  Respiratory: Positive for cough. Negative for chest tightness, shortness of breath and wheezing.   Cardiovascular: Negative for chest pain and palpitations.  Gastrointestinal: Negative for abdominal distention, abdominal pain and constipation.  Endocrine: Negative for polydipsia.  Genitourinary: Negative for dysuria and frequency.  Musculoskeletal: Positive for back pain. Negative for arthralgias.  Skin: Negative for rash.  Neurological: Positive for numbness. Negative for tremors and light-headedness.  Hematological: Does not bruise/bleed  easily.  Psychiatric/Behavioral: Negative for agitation and behavioral problems.    Objective:  BP 140/90   Pulse 87   Temp 98.5 F (36.9 C) (Oral)   Wt 181 lb 6.4 oz (82.3 kg)   SpO2 98%   BMI 33.18 kg/m   BP/Weight 12/01/2016 09/17/2016 0/93/8182  Systolic BP 993 716 967  Diastolic BP 90 83 87  Wt. (Lbs) 181.4 178 172  BMI 33.18 32.56 31.46      Physical Exam  Constitutional: She is oriented to person, place, and time. She appears well-developed and well-nourished.  HENT:  Right Ear: External ear normal.  Left Ear: External ear normal.  Mild oropharyngeal erythema  Cardiovascular: Normal rate, normal heart sounds and intact distal pulses.   No murmur heard. Pulmonary/Chest: Effort normal and breath sounds normal. She has no wheezes. She has no rales. She exhibits no tenderness.  Abdominal: Soft. Bowel sounds are normal. She exhibits no distension and no mass. There is no tenderness.  Musculoskeletal: Normal range of motion. She exhibits tenderness (slight tenderness on deep palpation of right lower back; negative straight leg raise bilaterally).  Left brachiocephalic AV fistula with palpable thrill  Neurological: She is alert and oriented to person, place, and time.     CMP Latest Ref Rng & Units 09/08/2016 05/25/2016 04/22/2016  Glucose 65 - 99 mg/dL 81 109(H) 84  BUN 6 - 20 mg/dL 37(H) - -  Creatinine 0.44 - 1.00 mg/dL 6.20(H) - -  Sodium 135 - 145 mmol/L 140 139 141  Potassium 3.5 - 5.1 mmol/L 4.7 4.2 4.8  Chloride 101 - 111 mmol/L 100(L) - -  CO2 22 - 32 mmol/L - - -  Calcium 8.9 - 10.3 mg/dL - - -  Total Protein 6.0 - 8.3 g/dL - - -  Total Bilirubin 0.2 - 1.2 mg/dL - - -  Alkaline Phos 39 - 117 U/L - - -  AST 0 - 37 U/L - - -  ALT 0 - 35 U/L - - -    Lipid Panel     Component Value Date/Time   CHOL 208 (H) 09/02/2015 0958   TRIG 299 (H) 09/02/2015 0958   HDL 45 (L) 09/02/2015 0958   CHOLHDL 4.6 09/02/2015 0958   VLDL 60 (H) 09/02/2015 0958   LDLCALC 103  09/02/2015 0958   LDLDIRECT 130.7 04/05/2013 0850    Lab  Results  Component Value Date   HGBA1C 5.3 09/17/2016    Assessment & Plan:   1. Pure hypercholesterolemia Controlled  2. Essential hypertension, benign Slightly elevated above goal of less than 130/80 No regimen change as she is due for dialysis tomorrow  3. End stage renal disease ([place]) Continue hemodialysis on Mondays, Wednesdays and Fridays  4. Idiopathic chronic gout of multiple sites without tophus No frequent flares  5. Cough Current secondary to chronic sinusitis Placed on antihistamine If symptoms are uncontrolled will need to switch to Advair from Flovent - cetirizine (ZYRTEC) 10 MG tablet; Take 1 tablet (10 mg total) by mouth daily.  Dispense: 30 tablet; Refill: 1 - benzonatate (TESSALON) 100 MG capsule; Take 1 capsule (100 mg total) by mouth 3 (three) times daily as needed for cough.  Dispense: 21 capsule; Refill: 0  6. Chronic right-sided low back pain without sciatica Likely muscle spasm Advised to apply heat - cyclobenzaprine (FLEXERIL) 10 MG tablet; Take 1 tablet (10 mg total) by mouth 2 (two) times daily as needed. For back pain  Dispense: 60 tablet; Refill: 1 - gabapentin (NEURONTIN) 300 MG capsule; Take 1 capsule (300 mg total) by mouth 2 (two) times daily.  Dispense: 60 capsule; Refill: 3  7.Neuropathy Placed on gabapentin  8. Asthma Stable If cough persists will consider switching from ICS to ICS/LABA Meds ordered this encounter  Medications  . cetirizine (ZYRTEC) 10 MG tablet    Sig: Take 1 tablet (10 mg total) by mouth daily.    Dispense:  30 tablet    Refill:  1  . cyclobenzaprine (FLEXERIL) 10 MG tablet    Sig: Take 1 tablet (10 mg total) by mouth 2 (two) times daily as needed. For back pain    Dispense:  60 tablet    Refill:  1  . gabapentin (NEURONTIN) 300 MG capsule    Sig: Take 1 capsule (300 mg total) by mouth 2 (two) times daily.    Dispense:  60 capsule    Refill:  3  .  benzonatate (TESSALON) 100 MG capsule    Sig: Take 1 capsule (100 mg total) by mouth 3 (three) times daily as needed for cough.    Dispense:  21 capsule    Refill:  0    Follow-up: Return in about 3 months (around 03/02/2017) for Follow-up on chronic medical conditions.   Arnoldo Morale MD

## 2016-12-01 NOTE — Patient Instructions (Signed)

## 2016-12-02 DIAGNOSIS — N2581 Secondary hyperparathyroidism of renal origin: Secondary | ICD-10-CM | POA: Diagnosis not present

## 2016-12-02 DIAGNOSIS — E1129 Type 2 diabetes mellitus with other diabetic kidney complication: Secondary | ICD-10-CM | POA: Diagnosis not present

## 2016-12-02 DIAGNOSIS — Z23 Encounter for immunization: Secondary | ICD-10-CM | POA: Diagnosis not present

## 2016-12-02 DIAGNOSIS — N186 End stage renal disease: Secondary | ICD-10-CM | POA: Diagnosis not present

## 2016-12-02 DIAGNOSIS — D631 Anemia in chronic kidney disease: Secondary | ICD-10-CM | POA: Diagnosis not present

## 2016-12-02 DIAGNOSIS — D509 Iron deficiency anemia, unspecified: Secondary | ICD-10-CM | POA: Diagnosis not present

## 2016-12-04 DIAGNOSIS — N186 End stage renal disease: Secondary | ICD-10-CM | POA: Diagnosis not present

## 2016-12-04 DIAGNOSIS — D509 Iron deficiency anemia, unspecified: Secondary | ICD-10-CM | POA: Diagnosis not present

## 2016-12-04 DIAGNOSIS — N2581 Secondary hyperparathyroidism of renal origin: Secondary | ICD-10-CM | POA: Diagnosis not present

## 2016-12-04 DIAGNOSIS — D631 Anemia in chronic kidney disease: Secondary | ICD-10-CM | POA: Diagnosis not present

## 2016-12-04 DIAGNOSIS — Z23 Encounter for immunization: Secondary | ICD-10-CM | POA: Diagnosis not present

## 2016-12-04 DIAGNOSIS — E1129 Type 2 diabetes mellitus with other diabetic kidney complication: Secondary | ICD-10-CM | POA: Diagnosis not present

## 2016-12-09 DIAGNOSIS — D509 Iron deficiency anemia, unspecified: Secondary | ICD-10-CM | POA: Diagnosis not present

## 2016-12-09 DIAGNOSIS — E1129 Type 2 diabetes mellitus with other diabetic kidney complication: Secondary | ICD-10-CM | POA: Diagnosis not present

## 2016-12-09 DIAGNOSIS — D631 Anemia in chronic kidney disease: Secondary | ICD-10-CM | POA: Diagnosis not present

## 2016-12-09 DIAGNOSIS — Z23 Encounter for immunization: Secondary | ICD-10-CM | POA: Diagnosis not present

## 2016-12-09 DIAGNOSIS — N186 End stage renal disease: Secondary | ICD-10-CM | POA: Diagnosis not present

## 2016-12-09 DIAGNOSIS — N2581 Secondary hyperparathyroidism of renal origin: Secondary | ICD-10-CM | POA: Diagnosis not present

## 2016-12-11 DIAGNOSIS — Z23 Encounter for immunization: Secondary | ICD-10-CM | POA: Diagnosis not present

## 2016-12-11 DIAGNOSIS — N186 End stage renal disease: Secondary | ICD-10-CM | POA: Diagnosis not present

## 2016-12-11 DIAGNOSIS — D509 Iron deficiency anemia, unspecified: Secondary | ICD-10-CM | POA: Diagnosis not present

## 2016-12-11 DIAGNOSIS — D631 Anemia in chronic kidney disease: Secondary | ICD-10-CM | POA: Diagnosis not present

## 2016-12-11 DIAGNOSIS — N2581 Secondary hyperparathyroidism of renal origin: Secondary | ICD-10-CM | POA: Diagnosis not present

## 2016-12-11 DIAGNOSIS — E1129 Type 2 diabetes mellitus with other diabetic kidney complication: Secondary | ICD-10-CM | POA: Diagnosis not present

## 2016-12-14 DIAGNOSIS — N186 End stage renal disease: Secondary | ICD-10-CM | POA: Diagnosis not present

## 2016-12-14 DIAGNOSIS — E1129 Type 2 diabetes mellitus with other diabetic kidney complication: Secondary | ICD-10-CM | POA: Diagnosis not present

## 2016-12-14 DIAGNOSIS — Z23 Encounter for immunization: Secondary | ICD-10-CM | POA: Diagnosis not present

## 2016-12-14 DIAGNOSIS — N2581 Secondary hyperparathyroidism of renal origin: Secondary | ICD-10-CM | POA: Diagnosis not present

## 2016-12-14 DIAGNOSIS — D631 Anemia in chronic kidney disease: Secondary | ICD-10-CM | POA: Diagnosis not present

## 2016-12-14 DIAGNOSIS — D509 Iron deficiency anemia, unspecified: Secondary | ICD-10-CM | POA: Diagnosis not present

## 2016-12-16 ENCOUNTER — Other Ambulatory Visit: Payer: Self-pay | Admitting: Nephrology

## 2016-12-16 ENCOUNTER — Ambulatory Visit
Admission: RE | Admit: 2016-12-16 | Discharge: 2016-12-16 | Disposition: A | Discharge status: Home / Self Care | Payer: Medicare Other | Source: Ambulatory Visit | Attending: Nephrology | Admitting: Nephrology

## 2016-12-16 DIAGNOSIS — R053 Chronic cough: Secondary | ICD-10-CM

## 2016-12-16 DIAGNOSIS — N2581 Secondary hyperparathyroidism of renal origin: Secondary | ICD-10-CM | POA: Diagnosis not present

## 2016-12-16 DIAGNOSIS — N186 End stage renal disease: Secondary | ICD-10-CM | POA: Diagnosis not present

## 2016-12-16 DIAGNOSIS — R05 Cough: Secondary | ICD-10-CM

## 2016-12-16 DIAGNOSIS — D631 Anemia in chronic kidney disease: Secondary | ICD-10-CM | POA: Diagnosis not present

## 2016-12-16 DIAGNOSIS — Z23 Encounter for immunization: Secondary | ICD-10-CM | POA: Diagnosis not present

## 2016-12-16 DIAGNOSIS — D509 Iron deficiency anemia, unspecified: Secondary | ICD-10-CM | POA: Diagnosis not present

## 2016-12-16 DIAGNOSIS — E1129 Type 2 diabetes mellitus with other diabetic kidney complication: Secondary | ICD-10-CM | POA: Diagnosis not present

## 2016-12-18 DIAGNOSIS — D509 Iron deficiency anemia, unspecified: Secondary | ICD-10-CM | POA: Diagnosis not present

## 2016-12-18 DIAGNOSIS — Z23 Encounter for immunization: Secondary | ICD-10-CM | POA: Diagnosis not present

## 2016-12-18 DIAGNOSIS — N186 End stage renal disease: Secondary | ICD-10-CM | POA: Diagnosis not present

## 2016-12-18 DIAGNOSIS — D631 Anemia in chronic kidney disease: Secondary | ICD-10-CM | POA: Diagnosis not present

## 2016-12-18 DIAGNOSIS — E1129 Type 2 diabetes mellitus with other diabetic kidney complication: Secondary | ICD-10-CM | POA: Diagnosis not present

## 2016-12-18 DIAGNOSIS — N2581 Secondary hyperparathyroidism of renal origin: Secondary | ICD-10-CM | POA: Diagnosis not present

## 2016-12-20 DIAGNOSIS — N186 End stage renal disease: Secondary | ICD-10-CM | POA: Diagnosis not present

## 2016-12-20 DIAGNOSIS — I129 Hypertensive chronic kidney disease with stage 1 through stage 4 chronic kidney disease, or unspecified chronic kidney disease: Secondary | ICD-10-CM | POA: Diagnosis not present

## 2016-12-20 DIAGNOSIS — Z992 Dependence on renal dialysis: Secondary | ICD-10-CM | POA: Diagnosis not present

## 2016-12-23 DIAGNOSIS — E1129 Type 2 diabetes mellitus with other diabetic kidney complication: Secondary | ICD-10-CM | POA: Diagnosis not present

## 2016-12-23 DIAGNOSIS — D509 Iron deficiency anemia, unspecified: Secondary | ICD-10-CM | POA: Diagnosis not present

## 2016-12-23 DIAGNOSIS — N2581 Secondary hyperparathyroidism of renal origin: Secondary | ICD-10-CM | POA: Diagnosis not present

## 2016-12-23 DIAGNOSIS — N186 End stage renal disease: Secondary | ICD-10-CM | POA: Diagnosis not present

## 2016-12-25 DIAGNOSIS — E1129 Type 2 diabetes mellitus with other diabetic kidney complication: Secondary | ICD-10-CM | POA: Diagnosis not present

## 2016-12-25 DIAGNOSIS — N2581 Secondary hyperparathyroidism of renal origin: Secondary | ICD-10-CM | POA: Diagnosis not present

## 2016-12-25 DIAGNOSIS — D509 Iron deficiency anemia, unspecified: Secondary | ICD-10-CM | POA: Diagnosis not present

## 2016-12-25 DIAGNOSIS — N186 End stage renal disease: Secondary | ICD-10-CM | POA: Diagnosis not present

## 2016-12-28 DIAGNOSIS — N2581 Secondary hyperparathyroidism of renal origin: Secondary | ICD-10-CM | POA: Diagnosis not present

## 2016-12-28 DIAGNOSIS — E1129 Type 2 diabetes mellitus with other diabetic kidney complication: Secondary | ICD-10-CM | POA: Diagnosis not present

## 2016-12-28 DIAGNOSIS — N186 End stage renal disease: Secondary | ICD-10-CM | POA: Diagnosis not present

## 2016-12-28 DIAGNOSIS — D509 Iron deficiency anemia, unspecified: Secondary | ICD-10-CM | POA: Diagnosis not present

## 2016-12-29 ENCOUNTER — Other Ambulatory Visit: Payer: Self-pay | Admitting: Family Medicine

## 2016-12-30 DIAGNOSIS — E1129 Type 2 diabetes mellitus with other diabetic kidney complication: Secondary | ICD-10-CM | POA: Diagnosis not present

## 2016-12-30 DIAGNOSIS — N186 End stage renal disease: Secondary | ICD-10-CM | POA: Diagnosis not present

## 2016-12-30 DIAGNOSIS — D509 Iron deficiency anemia, unspecified: Secondary | ICD-10-CM | POA: Diagnosis not present

## 2016-12-30 DIAGNOSIS — N2581 Secondary hyperparathyroidism of renal origin: Secondary | ICD-10-CM | POA: Diagnosis not present

## 2017-01-01 DIAGNOSIS — D509 Iron deficiency anemia, unspecified: Secondary | ICD-10-CM | POA: Diagnosis not present

## 2017-01-01 DIAGNOSIS — N186 End stage renal disease: Secondary | ICD-10-CM | POA: Diagnosis not present

## 2017-01-01 DIAGNOSIS — N2581 Secondary hyperparathyroidism of renal origin: Secondary | ICD-10-CM | POA: Diagnosis not present

## 2017-01-01 DIAGNOSIS — E1129 Type 2 diabetes mellitus with other diabetic kidney complication: Secondary | ICD-10-CM | POA: Diagnosis not present

## 2017-01-04 ENCOUNTER — Ambulatory Visit (HOSPITAL_COMMUNITY)
Admission: RE | Admit: 2017-01-04 | Discharge: 2017-01-04 | Disposition: A | Discharge status: Home / Self Care | Payer: Medicare Other | Source: Ambulatory Visit | Attending: Nephrology | Admitting: Nephrology

## 2017-01-04 ENCOUNTER — Telehealth: Payer: Self-pay | Admitting: *Deleted

## 2017-01-04 ENCOUNTER — Other Ambulatory Visit (HOSPITAL_COMMUNITY): Payer: Self-pay | Admitting: Nephrology

## 2017-01-04 ENCOUNTER — Encounter (HOSPITAL_COMMUNITY): Payer: Self-pay

## 2017-01-04 DIAGNOSIS — M109 Gout, unspecified: Secondary | ICD-10-CM | POA: Diagnosis not present

## 2017-01-04 DIAGNOSIS — Z992 Dependence on renal dialysis: Secondary | ICD-10-CM | POA: Insufficient documentation

## 2017-01-04 DIAGNOSIS — I120 Hypertensive chronic kidney disease with stage 5 chronic kidney disease or end stage renal disease: Secondary | ICD-10-CM | POA: Insufficient documentation

## 2017-01-04 DIAGNOSIS — E7800 Pure hypercholesterolemia, unspecified: Secondary | ICD-10-CM | POA: Insufficient documentation

## 2017-01-04 DIAGNOSIS — E039 Hypothyroidism, unspecified: Secondary | ICD-10-CM | POA: Insufficient documentation

## 2017-01-04 DIAGNOSIS — R7303 Prediabetes: Secondary | ICD-10-CM | POA: Insufficient documentation

## 2017-01-04 DIAGNOSIS — T82868A Thrombosis of vascular prosthetic devices, implants and grafts, initial encounter: Secondary | ICD-10-CM

## 2017-01-04 DIAGNOSIS — N186 End stage renal disease: Secondary | ICD-10-CM | POA: Insufficient documentation

## 2017-01-04 DIAGNOSIS — Z880 Allergy status to penicillin: Secondary | ICD-10-CM | POA: Insufficient documentation

## 2017-01-04 DIAGNOSIS — M069 Rheumatoid arthritis, unspecified: Secondary | ICD-10-CM | POA: Diagnosis not present

## 2017-01-04 DIAGNOSIS — K219 Gastro-esophageal reflux disease without esophagitis: Secondary | ICD-10-CM | POA: Diagnosis not present

## 2017-01-04 DIAGNOSIS — J45909 Unspecified asthma, uncomplicated: Secondary | ICD-10-CM | POA: Insufficient documentation

## 2017-01-04 DIAGNOSIS — G4730 Sleep apnea, unspecified: Secondary | ICD-10-CM | POA: Insufficient documentation

## 2017-01-04 DIAGNOSIS — Y832 Surgical operation with anastomosis, bypass or graft as the cause of abnormal reaction of the patient, or of later complication, without mention of misadventure at the time of the procedure: Secondary | ICD-10-CM | POA: Insufficient documentation

## 2017-01-04 HISTORY — PX: IR THROMBECTOMY AV FISTULA W/THROMBOLYSIS/PTA INC/SHUNT/IMG LEFT: IMG6106

## 2017-01-04 HISTORY — PX: IR US GUIDE VASC ACCESS LEFT: IMG2389

## 2017-01-04 LAB — BASIC METABOLIC PANEL
Anion gap: 15 (ref 5–15)
BUN: 110 mg/dL — ABNORMAL HIGH (ref 6–20)
CO2: 21 mmol/L — ABNORMAL LOW (ref 22–32)
Calcium: 9.7 mg/dL (ref 8.9–10.3)
Chloride: 104 mmol/L (ref 101–111)
Creatinine, Ser: 10.41 mg/dL — ABNORMAL HIGH (ref 0.44–1.00)
GFR calc Af Amer: 4 mL/min — ABNORMAL LOW (ref >60)
GFR calc non Af Amer: 4 mL/min — ABNORMAL LOW (ref >60)
Glucose, Bld: 100 mg/dL — ABNORMAL HIGH (ref 65–99)
Potassium: 6.2 mmol/L — ABNORMAL HIGH (ref 3.5–5.1)
Sodium: 140 mmol/L (ref 135–145)

## 2017-01-04 LAB — POCT I-STAT, CHEM 8
BUN: 99 mg/dL — ABNORMAL HIGH (ref 6–20)
Calcium, Ion: 1.24 mmol/L (ref 1.15–1.40)
Chloride: 106 mmol/L (ref 101–111)
Creatinine, Ser: 10.8 mg/dL — ABNORMAL HIGH (ref 0.44–1.00)
Glucose, Bld: 99 mg/dL (ref 65–99)
HCT: 41 % (ref 36.0–46.0)
Hemoglobin: 13.9 g/dL (ref 12.0–15.0)
Potassium: 5.3 mmol/L — ABNORMAL HIGH (ref 3.5–5.1)
Sodium: 140 mmol/L (ref 135–145)
TCO2: 23 mmol/L (ref 22–32)

## 2017-01-04 LAB — GLUCOSE, CAPILLARY
Glucose-Capillary: 95 mg/dL (ref 65–99)
Glucose-Capillary: 102 mg/dL — ABNORMAL HIGH (ref 65–99)

## 2017-01-04 MED ORDER — LIDOCAINE HCL (PF) 1 % IJ SOLN
INTRAMUSCULAR | Status: AC | PRN
  Administered 2017-01-04: 8 mL

## 2017-01-04 MED ORDER — LIDOCAINE HCL 1 % IJ SOLN
INTRAMUSCULAR | Status: AC
  Filled 2017-01-04: qty 20

## 2017-01-04 MED ORDER — MIDAZOLAM HCL 2 MG/2ML IJ SOLN
INTRAMUSCULAR | Status: AC
  Filled 2017-01-04: qty 6

## 2017-01-04 MED ORDER — ALTEPLASE 2 MG IJ SOLR
INTRAMUSCULAR | Status: AC | PRN
  Administered 2017-01-04 (×2): 2 mg

## 2017-01-04 MED ORDER — INSULIN ASPART 100 UNIT/ML IV SOLN
10.0000 [IU] | Freq: Once | INTRAVENOUS | Status: AC
  Administered 2017-01-04: 10 [IU] via INTRAVENOUS
  Filled 2017-01-04: qty 0.1

## 2017-01-04 MED ORDER — MIDAZOLAM HCL 2 MG/2ML IJ SOLN
INTRAMUSCULAR | Status: AC | PRN
  Administered 2017-01-04: 1 mg via INTRAVENOUS
  Administered 2017-01-04: 0.5 mg via INTRAVENOUS
  Administered 2017-01-04: 1 mg via INTRAVENOUS
  Administered 2017-01-04: 0.5 mg via INTRAVENOUS
  Administered 2017-01-04: 1 mg via INTRAVENOUS

## 2017-01-04 MED ORDER — HEPARIN SODIUM (PORCINE) 1000 UNIT/ML IJ SOLN
INTRAMUSCULAR | Status: AC | PRN
  Administered 2017-01-04: 3000 [IU] via INTRAVENOUS

## 2017-01-04 MED ORDER — FENTANYL CITRATE (PF) 100 MCG/2ML IJ SOLN
INTRAMUSCULAR | Status: AC | PRN
  Administered 2017-01-04: 25 ug via INTRAVENOUS
  Administered 2017-01-04: 50 ug via INTRAVENOUS
  Administered 2017-01-04 (×2): 25 ug via INTRAVENOUS

## 2017-01-04 MED ORDER — IOPAMIDOL (ISOVUE-300) INJECTION 61%
INTRAVENOUS | Status: AC
  Administered 2017-01-04: 55 mL
  Filled 2017-01-04: qty 100

## 2017-01-04 MED ORDER — HEPARIN SODIUM (PORCINE) 1000 UNIT/ML IJ SOLN
INTRAMUSCULAR | Status: AC
  Filled 2017-01-04: qty 1

## 2017-01-04 MED ORDER — FENTANYL CITRATE (PF) 100 MCG/2ML IJ SOLN
INTRAMUSCULAR | Status: AC
  Filled 2017-01-04: qty 4

## 2017-01-04 MED ORDER — ALTEPLASE 2 MG IJ SOLR
INTRAMUSCULAR | Status: AC
  Filled 2017-01-04: qty 2

## 2017-01-04 MED ORDER — DEXTROSE 50 % IV SOLN
1.0000 | Freq: Once | INTRAVENOUS | Status: AC
  Administered 2017-01-04: 50 mL via INTRAVENOUS

## 2017-01-04 MED ORDER — DEXTROSE 50 % IV SOLN
INTRAVENOUS | Status: AC
  Filled 2017-01-04: qty 50

## 2017-01-04 MED ORDER — SODIUM POLYSTYRENE SULFONATE 15 GM/60ML PO SUSP
30.0000 g | Freq: Once | ORAL | Status: AC
Start: 2017-01-04 — End: 2017-01-04
  Administered 2017-01-04: 30 g via ORAL
  Filled 2017-01-04 (×2): qty 120

## 2017-01-04 NOTE — Sedation Documentation (Signed)
Patient is resting comfortably. 

## 2017-01-04 NOTE — Progress Notes (Signed)
Dr Barbie Banner notified of K+ and ok to d/c home

## 2017-01-04 NOTE — H&P (Signed)
Chief Complaint: Patient was seen in consultation today for clotted HD AVG at the request of [place]  Referring Physician(s): [place]  Supervising Physician: Marybelle Killings  Patient Status: [facility] - Out-pt  History of Present Illness: Rachael George is a 61 y.o. female with ESRD. She normally gets HD on M-W-F via (L)UE AVG. Her treatment was a few days ago, went in for treatment this am and graft was found to be thrombosed. She is referred for declot procedure. PMHx, meds, labs, imaging reviewed. Pt reports AVG created in 3/18, and she states Dr. Trula Slade has had to work on it and perform angioplasty on one occasion in 08/2016. She feels well, no acute c/o. Has been NPO this am.  Past Medical History:  Diagnosis Date  . Anemia   . Asthma   . ESRD (end stage renal disease) ([place])    MWF  . GERD (gastroesophageal reflux disease)   . Gout   . 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 Heavy menstrual bleeding   . High cholesterol   . History of blood transfusion 02/2016- last one   1990's- several ones   . History of kidney stones 1980's  . Hyperlipemia 12/07/2012  . Hypertension   . Hypothyroidism   . Kidney stones   . Pre-diabetes   . Prediabetes   . RA (rheumatoid arthritis) ([place])   . Sleep apnea    [place]- have CPAP, not able to use every night     Past Surgical History:  Procedure Laterality Date  . A/V SHUNTOGRAM N/A 09/08/2016   Procedure: A/V Shuntogram - Left Arm AV Graft;  Surgeon: Serafina Mitchell, MD;  Location: [place] [facility];  Service: Cardiovascular;  Laterality: N/A;  . ABDOMINAL HYSTERECTOMY  2000  . AV FISTULA PLACEMENT Left 05/25/2016   Procedure: INSERTION OF LEFT UPPER ARM ARTERIOVENOUS (AV) LOOP GORE-TEX GRAFT ARM;  Surgeon: Elam Dutch, MD;  Location: [place];  Service: Vascular;  Laterality: Left;  . BASCILIC VEIN TRANSPOSITION Right 12/06/2014   Procedure: FIRST STAGE BASILIC VEIN TRANSPOSITION - RIGHT;   Surgeon: Serafina Mitchell, MD;  Location: [place];  Service: Vascular;  Laterality: Right;  . BASCILIC VEIN TRANSPOSITION Right 02/07/2015   Procedure: RIGHT ARM SECOND STAGE BASILIC VEIN TRANSPOSITION;  Surgeon: Serafina Mitchell, MD;  Location: [place];  Service: Vascular;  Laterality: Right;  . BASCILIC VEIN TRANSPOSITION Left 04/22/2016   Procedure: FIRST STAGE BASILIC VEIN TRANSPOSITION LEFT ARM;  Surgeon: Conrad Tombstone, MD;  Location: [place];  Service: Vascular;  Laterality: Left;  . COLONOSCOPY W/ POLYPECTOMY    . ECTOPIC PREGNANCY SURGERY  02/1982  . EXCHANGE OF A DIALYSIS CATHETER Right 04/22/2016   Procedure: EXCHANGE OF A DIALYSIS CATHETER - INSERTION RIGHT INTERNAL JUGULAR & REMOVAL FROM LEFT INTERNAL JUGULAR;  Surgeon: Conrad [place], MD;  Location: [place];  Service: Vascular;  Laterality: Right;  . I&D EXTREMITY Right 02/07/2015   Procedure: IRRIGATION AND DEBRIDEMENT RIGHT ARM HEMATOMA;  Surgeon: Serafina Mitchell, MD;  Location: [place];  Service: Vascular;  Laterality: Right;  . INSERTION OF DIALYSIS CATHETER  03/03/2016   Procedure: INSERTION OF DIALYSIS CATHETER;  Surgeon: Elam Dutch, MD;  Location: [place];  Service: Vascular;;  . left shoulder surgery  08/2005  . LIGATION OF ARTERIOVENOUS  FISTULA  03/03/2016   Procedure: LIGATION OF ARTERIOVENOUS  FISTULA;  Surgeon: Elam Dutch, MD;  Location: [place];  Service: Vascular;;  .  PERIPHERAL VASCULAR BALLOON ANGIOPLASTY  09/08/2016   Procedure: Peripheral Vascular Balloon Angioplasty;  Surgeon: Serafina Mitchell, MD;  Location: [place] [facility];  Service: Cardiovascular;;  left avf  . TEE WITHOUT CARDIOVERSION N/A 03/06/2016   Procedure: TRANSESOPHAGEAL ECHOCARDIOGRAM (TEE);  Surgeon: Sueanne Margarita, MD;  Location: [place];  Service: Cardiovascular;  Laterality: N/A;  . TUBAL LIGATION  11/1986    Allergies: Atacand [candesartan]; Lisinopril; Nsaids; and Penicillins  Medications: Prior to Admission medications   Medication Sig  Start Date End Date Taking? Authorizing Provider  acetaminophen (TYLENOL) 500 MG tablet Take 1,000 mg by mouth every 6 (six) hours as needed for mild pain.     [provider]  allopurinol (ZYLOPRIM) 100 MG tablet Take 100 mg by mouth 2 (two) times daily. Reported on 09/02/2015    [provider]  amLODipine (NORVASC) 10 MG tablet Take 10 mg by mouth at bedtime.     [provider]  b complex-vitamin c-folic acid (NEPHRO-VITE) 0.8 MG TABS tablet Take 1 tablet by mouth every morning.     [provider]  benzonatate (TESSALON) 100 MG capsule Take 1 capsule (100 mg total) by mouth 3 (three) times daily as needed for cough. 12/01/16   Arnoldo Morale, MD  cetirizine (ZYRTEC) 10 MG tablet Take 1 tablet (10 mg total) by mouth daily. 12/01/16   Arnoldo Morale, MD  cinacalcet (SENSIPAR) 60 MG tablet Take 60 mg by mouth every other day. In the evening    [provider]  clotrimazole (LOTRIMIN) 1 % cream Apply 1 application topically 2 (two) times daily. Apply to inner thighs 09/17/16   Arnoldo Morale, MD  colchicine 0.6 MG tablet Take 2 tabs (1.2mg ) at the onset of a gout flare, may repeat 1 tab (0.6mg ) if symptoms persist Patient taking differently: Take 0.6-1.2 mg by mouth 2 (two) times daily as needed (for gout flare up as instructed). Take 2 tabs (1.2mg ) at the onset of a gout flare, may repeat 1 tab (0.6mg ) if symptoms persist 09/02/15   Arnoldo Morale, MD  cyclobenzaprine (FLEXERIL) 10 MG tablet Take 1 tablet (10 mg total) by mouth 2 (two) times daily as needed. For back pain 12/01/16   Arnoldo Morale, MD  famotidine (PEPCID AC MAXIMUM STRENGTH) 20 MG tablet Take 20 mg by mouth 2 (two) times daily.    [provider]  fluticasone (FLOVENT HFA) 110 MCG/ACT inhaler Inhale 1 puff into the lungs 2 (two) times daily. Patient taking differently: Inhale 1 puff into the lungs at bedtime.  05/05/16   Arnoldo Morale, MD  gabapentin (NEURONTIN) 300 MG capsule Take 1 capsule  (300 mg total) by mouth 2 (two) times daily. 12/01/16   Arnoldo Morale, MD  olmesartan (BENICAR) 20 MG tablet Take 20 mg by mouth daily with lunch. 08/22/16   [provider]  pantoprazole (PROTONIX) 40 MG tablet Take 40 mg by mouth 2 (two) times daily.    [provider]  sevelamer (RENAGEL) 800 MG tablet Take 1,600-3,200 mg by mouth See admin instructions. 3,200 mg three times a day with meals and 1,600 mg twice a day with snacks    [provider]  triamcinolone cream (KENALOG) 0.1 % Apply 1 application topically 2 (two) times daily. Applied to eczematous lesions 09/17/16   Arnoldo Morale, MD  VENTOLIN HFA 108 (90 Base) MCG/ACT inhaler INHALE 2 PUFFS INTO THE LUNGS EVERY 6 HOURS AS NEEDED FOR WHEEZING ORSHORTNESS OF BREATH 12/29/16   Arnoldo Morale, MD  Family History  Problem Relation Age of Onset  . Hypertension Mother   . Breast cancer Maternal Aunt   . Deep vein thrombosis Daughter   . Hyperlipidemia Daughter   . Hypertension Daughter   . Prostate cancer Maternal Grandmother     Social History   Social History  . Marital status: Legally Separated    Spouse name: N/A  . Number of children: 3  . Years of education: N/A   Social History Main Topics  . Smoking status: Never Smoker  . Smokeless tobacco: Never Used  . Alcohol use 0.0 oz/week     Comment: occ - 1 drink rarely  . Drug use: No  . Sexual activity: Not Asked   Other Topics Concern  . 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  All other systems are negative.  Review of Systems  Vital Signs: BP (!) 175/108   Pulse 98   Temp 98.1 F (36.7 C)   Resp 18   Ht 5\' 2"  (1.575 m)    Wt 179 lb (81.2 kg)   SpO2 99%   BMI 32.74 kg/m   Physical Exam  Constitutional: She is oriented to person, place, and time. She appears well-developed. No distress.  HENT:  Mouth/Throat: Oropharynx is clear and moist.  Neck: Normal range of motion. No JVD present. No tracheal deviation present.  Cardiovascular: Normal rate, regular rhythm and normal heart sounds.   Pulmonary/Chest: Effort normal and breath sounds normal. No respiratory distress.  Musculoskeletal:  (L)UE with palpable AVG, however, no pulse/thrill/bruit Hand warm, good brachial and radial pulses  Neurological: She is alert and oriented to person, place, and time.  Skin: Skin is warm and dry.  Psychiatric: She has a normal mood and affect. Judgment normal.    Imaging: Dg Chest 2 View  Result Date: 12/17/2016 CLINICAL DATA:  Chronic dry cough, shortness of Breath EXAM: CHEST  2 VIEW COMPARISON:  04/22/2016 FINDINGS: Heart and mediastinal contours are within normal limits. No focal opacities or effusions. No acute bony abnormality. IMPRESSION: No active cardiopulmonary disease. Electronically Signed   By: Rolm Baptise M.D.   On: 12/17/2016 08:26    Labs:  CBC:  Recent Labs  03/02/16 0836  03/03/16 0454 03/04/16 0915 03/05/16 1104 04/22/16 0956 05/25/16 0714 09/08/16 0959  WBC 7.3  --  9.6 6.1 4.8  --   --   --   HGB 11.7*  < > 8.6* 9.1* 8.5* 10.2* 11.9* 11.6*  HCT 36.2  < > 25.8* 26.8* 25.2* 30.0* 35.0* 34.0*  PLT 104*  --  73* 75* 90*  --   --   --   < > = values in this interval not displayed.  COAGS:  Recent Labs  03/03/16 0053  INR 1.19  APTT 31    BMP:  Recent Labs  03/02/16 0836 03/03/16 0454 03/04/16 0857 03/05/16 1104 04/22/16 0956 05/25/16 0714 09/08/16 0959  NA 134* 135 136 137 141 139 140  K 6.0* 4.7 3.9 4.0 4.8 4.2 4.7  CL 99* 101 98* 99*  --   --  100*  CO2 20* 21* 28 26  --   --   --   GLUCOSE 103* 133* 97 104* 84 109* 81  BUN 70* 72* 37* 53*  --   --  37*  CALCIUM 8.9  7.8* 8.5* 8.3*  --   --   --   CREATININE 10.54* 11.00* 7.75* 9.34*  --   --  6.20*  GFRNONAA 4* 3* 5* 4*  --   --   --   GFRAA 4* 4* 6* 5*  --   --   --     LIVER FUNCTION TESTS:  Recent Labs  03/04/16 0857 03/05/16 1104  ALBUMIN 2.8* 2.7*    TUMOR MARKERS: No results for input(s): AFPTM, CEA, CA199, CHROMGRNA in the last 8760 hours.  Assessment and Plan: Clotted (L)UE AVG Discussed thrombolysis/thrombectomy of AV Graft/Fistula, possible angioplasty, possible stent, possible HD catheter placement if necessary. Risks, benefits, use of sedation thoroughly explained.   Thank you for this interesting consult.  I greatly enjoyed meeting Rachael George and look forward to participating in their care.  A copy of this report was sent to the requesting provider on this date.  Electronically Signed: Ascencion Dike, PA-C 01/04/2017, 12:15 PM   I spent a total of 20 minutes in face to face in clinical consultation, greater than 50% of which was counseling/coordinating care for declot procedure

## 2017-01-04 NOTE — Discharge Instructions (Signed)

## 2017-01-04 NOTE — Telephone Encounter (Signed)
Call from [facility]. Patient clotted access. Patient does not have Dialysis Catheter; access greater than 1 month ond. Nurse instructed to call Interventional Radiology at [facility] .

## 2017-01-04 NOTE — Sedation Documentation (Signed)
Patient denies pain and is resting comfortably.  

## 2017-01-05 DIAGNOSIS — D509 Iron deficiency anemia, unspecified: Secondary | ICD-10-CM | POA: Diagnosis not present

## 2017-01-05 DIAGNOSIS — N186 End stage renal disease: Secondary | ICD-10-CM | POA: Diagnosis not present

## 2017-01-05 DIAGNOSIS — N2581 Secondary hyperparathyroidism of renal origin: Secondary | ICD-10-CM | POA: Diagnosis not present

## 2017-01-05 DIAGNOSIS — E1129 Type 2 diabetes mellitus with other diabetic kidney complication: Secondary | ICD-10-CM | POA: Diagnosis not present

## 2017-01-06 DIAGNOSIS — E1129 Type 2 diabetes mellitus with other diabetic kidney complication: Secondary | ICD-10-CM | POA: Diagnosis not present

## 2017-01-06 DIAGNOSIS — D509 Iron deficiency anemia, unspecified: Secondary | ICD-10-CM | POA: Diagnosis not present

## 2017-01-06 DIAGNOSIS — N2581 Secondary hyperparathyroidism of renal origin: Secondary | ICD-10-CM | POA: Diagnosis not present

## 2017-01-06 DIAGNOSIS — N186 End stage renal disease: Secondary | ICD-10-CM | POA: Diagnosis not present

## 2017-01-08 DIAGNOSIS — D509 Iron deficiency anemia, unspecified: Secondary | ICD-10-CM | POA: Diagnosis not present

## 2017-01-08 DIAGNOSIS — N186 End stage renal disease: Secondary | ICD-10-CM | POA: Diagnosis not present

## 2017-01-08 DIAGNOSIS — E1129 Type 2 diabetes mellitus with other diabetic kidney complication: Secondary | ICD-10-CM | POA: Diagnosis not present

## 2017-01-08 DIAGNOSIS — N2581 Secondary hyperparathyroidism of renal origin: Secondary | ICD-10-CM | POA: Diagnosis not present

## 2017-01-11 DIAGNOSIS — N186 End stage renal disease: Secondary | ICD-10-CM | POA: Diagnosis not present

## 2017-01-11 DIAGNOSIS — D509 Iron deficiency anemia, unspecified: Secondary | ICD-10-CM | POA: Diagnosis not present

## 2017-01-11 DIAGNOSIS — N2581 Secondary hyperparathyroidism of renal origin: Secondary | ICD-10-CM | POA: Diagnosis not present

## 2017-01-11 DIAGNOSIS — E1129 Type 2 diabetes mellitus with other diabetic kidney complication: Secondary | ICD-10-CM | POA: Diagnosis not present

## 2017-01-13 DIAGNOSIS — N2581 Secondary hyperparathyroidism of renal origin: Secondary | ICD-10-CM | POA: Diagnosis not present

## 2017-01-13 DIAGNOSIS — N186 End stage renal disease: Secondary | ICD-10-CM | POA: Diagnosis not present

## 2017-01-13 DIAGNOSIS — D509 Iron deficiency anemia, unspecified: Secondary | ICD-10-CM | POA: Diagnosis not present

## 2017-01-13 DIAGNOSIS — E1129 Type 2 diabetes mellitus with other diabetic kidney complication: Secondary | ICD-10-CM | POA: Diagnosis not present

## 2017-01-15 DIAGNOSIS — E1129 Type 2 diabetes mellitus with other diabetic kidney complication: Secondary | ICD-10-CM | POA: Diagnosis not present

## 2017-01-15 DIAGNOSIS — D509 Iron deficiency anemia, unspecified: Secondary | ICD-10-CM | POA: Diagnosis not present

## 2017-01-15 DIAGNOSIS — N186 End stage renal disease: Secondary | ICD-10-CM | POA: Diagnosis not present

## 2017-01-15 DIAGNOSIS — N2581 Secondary hyperparathyroidism of renal origin: Secondary | ICD-10-CM | POA: Diagnosis not present

## 2017-01-18 DIAGNOSIS — N186 End stage renal disease: Secondary | ICD-10-CM | POA: Diagnosis not present

## 2017-01-18 DIAGNOSIS — D509 Iron deficiency anemia, unspecified: Secondary | ICD-10-CM | POA: Diagnosis not present

## 2017-01-18 DIAGNOSIS — E1129 Type 2 diabetes mellitus with other diabetic kidney complication: Secondary | ICD-10-CM | POA: Diagnosis not present

## 2017-01-18 DIAGNOSIS — N2581 Secondary hyperparathyroidism of renal origin: Secondary | ICD-10-CM | POA: Diagnosis not present

## 2017-01-20 DIAGNOSIS — N186 End stage renal disease: Secondary | ICD-10-CM | POA: Diagnosis not present

## 2017-01-20 DIAGNOSIS — Z992 Dependence on renal dialysis: Secondary | ICD-10-CM | POA: Diagnosis not present

## 2017-01-20 DIAGNOSIS — E1129 Type 2 diabetes mellitus with other diabetic kidney complication: Secondary | ICD-10-CM | POA: Diagnosis not present

## 2017-01-20 DIAGNOSIS — I129 Hypertensive chronic kidney disease with stage 1 through stage 4 chronic kidney disease, or unspecified chronic kidney disease: Secondary | ICD-10-CM | POA: Diagnosis not present

## 2017-01-20 DIAGNOSIS — N2581 Secondary hyperparathyroidism of renal origin: Secondary | ICD-10-CM | POA: Diagnosis not present

## 2017-01-20 DIAGNOSIS — D509 Iron deficiency anemia, unspecified: Secondary | ICD-10-CM | POA: Diagnosis not present

## 2017-01-21 ENCOUNTER — Telehealth: Payer: Self-pay | Admitting: Family Medicine

## 2017-01-21 ENCOUNTER — Ambulatory Visit (INDEPENDENT_AMBULATORY_CARE_PROVIDER_SITE_OTHER): Payer: Medicare Other | Admitting: Pulmonary Disease

## 2017-01-21 ENCOUNTER — Encounter: Payer: Self-pay | Admitting: Pulmonary Disease

## 2017-01-21 ENCOUNTER — Institutional Professional Consult (permissible substitution): Payer: Medicare Other | Admitting: Pulmonary Disease

## 2017-01-21 ENCOUNTER — Other Ambulatory Visit (INDEPENDENT_AMBULATORY_CARE_PROVIDER_SITE_OTHER): Payer: Medicare Other

## 2017-01-21 VITALS — BP 142/88 | HR 100 | Ht 62.0 in | Wt 181.0 lb

## 2017-01-21 DIAGNOSIS — R0602 Shortness of breath: Secondary | ICD-10-CM

## 2017-01-21 DIAGNOSIS — G4733 Obstructive sleep apnea (adult) (pediatric): Secondary | ICD-10-CM

## 2017-01-21 LAB — CBC WITH DIFFERENTIAL/PLATELET
Basophils Absolute: 0 10*3/uL (ref 0.0–0.1)
Basophils Relative: 0.6 % (ref 0.0–3.0)
Eosinophils Absolute: 0.2 10*3/uL (ref 0.0–0.7)
Eosinophils Relative: 2.1 % (ref 0.0–5.0)
HCT: 40.5 % (ref 36.0–46.0)
Hemoglobin: 12.9 g/dL (ref 12.0–15.0)
Lymphocytes Relative: 26.9 % (ref 12.0–46.0)
Lymphs Abs: 2.3 10*3/uL (ref 0.7–4.0)
MCHC: 31.9 g/dL (ref 30.0–36.0)
MCV: 77.8 fl — ABNORMAL LOW (ref 78.0–100.0)
Monocytes Absolute: 0.7 10*3/uL (ref 0.1–1.0)
Monocytes Relative: 8.8 % (ref 3.0–12.0)
Neutro Abs: 5.2 10*3/uL (ref 1.4–7.7)
Neutrophils Relative %: 61.6 % (ref 43.0–77.0)
Platelets: 192 10*3/uL (ref 150.0–400.0)
RBC: 5.21 Mil/uL — ABNORMAL HIGH (ref 3.87–5.11)
RDW: 19.8 % — ABNORMAL HIGH (ref 11.5–15.5)
WBC: 8.4 10*3/uL (ref 4.0–10.5)

## 2017-01-21 LAB — NITRIC OXIDE: Nitric Oxide: 9

## 2017-01-21 MED ORDER — BUDESONIDE-FORMOTEROL FUMARATE 160-4.5 MCG/ACT IN AERO: 2.0000 | INHALATION_SPRAY | Freq: Two times a day (BID) | RESPIRATORY_TRACT | 0 refills | Status: DC

## 2017-01-21 NOTE — Patient Instructions (Addendum)
We will check a CBC differential and a blood allergy profile.  We will give you a sample of Symbicort 160/4.5.  Please let us know at return visit if this has improved your breathing Please call up [facility] for follow-up as you need a reevaluation of your acid reflux We will schedule you for a sleep study.  Follow-up in 1 month

## 2017-01-21 NOTE — Telephone Encounter (Signed)
Patient called asking for a referral to gastrology for acid reflux.

## 2017-01-21 NOTE — Progress Notes (Signed)
Rachael George    [id number]    1955/06/12  Primary Care Physician:Amao, Charlane Ferretti, MD  Referring Physician: Arnoldo Morale, [place],  [address]  Chief complaint: Consult for evaluation of cough  HPI: 61 year old with history of end-stage renal disease, asthma, rheumatoid arthritis, GERD Complains of cough for the past 8 weeks.  Symptoms are mostly at night and when she lays down.  Has mild dyspnea on exertion.  Denies any wheezing, sputum production, fevers, chills she had been treated with prednisone with no improvement in symptoms.  She has history of childhood asthma which improved as an adult.  She is currently on just albuterol rescue inhaler and Zyrtec which not helping with her symptoms. She has history of heartburn and multiple esophageal ulcers and sees [facility].  She has not followed up with them over the past year.  Currently on Protonix 40 mg twice daily but still has symptoms of heartburn and reflux.  History of severe sleep apnea [AHI 30], CPAP titrated to 11 by study done at [facility].  She had been noncompliant with CPAP and now requires a new study for initiation of therapy.  She is is on the waiting list for kidney transplant at [facility].  Outpatient Encounter Prescriptions as of 01/21/2017  Medication Sig  . acetaminophen (TYLENOL) 500 MG tablet Take 1,000 mg by mouth every 6 (six) hours as needed for mild pain.   Marland Kitchen allopurinol (ZYLOPRIM) 100 MG tablet Take 100 mg by mouth 2 (two) times daily. Reported on 09/02/2015  . amLODipine (NORVASC) 10 MG tablet Take 10 mg by mouth at bedtime.   Marland Kitchen b complex-vitamin c-folic acid (NEPHRO-VITE) 0.8 MG TABS tablet Take 1 tablet by mouth every morning.   . cetirizine (ZYRTEC) 10 MG tablet Take 1 tablet (10 mg total) by mouth daily.  . cinacalcet (SENSIPAR) 60 MG tablet Take 60 mg by mouth every Monday, Wednesday, and Friday at 6 PM.   . clotrimazole (LOTRIMIN) 1 % cream Apply 1 application topically 2  (two) times daily. Apply to inner thighs  . colchicine 0.6 MG tablet Take 2 tabs (1.2mg ) at the onset of a gout flare, may repeat 1 tab (0.6mg ) if symptoms persist (Patient taking differently: Take 0.6-1.2 mg by mouth 2 (two) times daily as needed (for gout flare up as instructed). Take 2 tabs (1.2mg ) at the onset of a gout flare, may repeat 1 tab (0.6mg ) if symptoms persist)  . cyclobenzaprine (FLEXERIL) 10 MG tablet Take 1 tablet (10 mg total) by mouth 2 (two) times daily as needed. For back pain  . famotidine (PEPCID AC MAXIMUM STRENGTH) 20 MG tablet Take 20 mg by mouth 2 (two) times daily.  . fluticasone (FLOVENT HFA) 110 MCG/ACT inhaler Inhale 1 puff into the lungs 2 (two) times daily.  Marland Kitchen gabapentin (NEURONTIN) 300 MG capsule Take 1 capsule (300 mg total) by mouth 2 (two) times daily.  Marland Kitchen olmesartan (BENICAR) 20 MG tablet Take 20 mg by mouth daily with lunch.  . pantoprazole (PROTONIX) 40 MG tablet Take 40 mg by mouth 2 (two) times daily.  . sucroferric oxyhydroxide (VELPHORO) 500 MG chewable tablet Chew 1,000-1,500 mg by mouth See admin instructions. Take 3 tablets (15oomg) three times daily with meals, and 2 tablets (1000mg ) with snacks  . triamcinolone cream (KENALOG) 0.1 % Apply 1 application topically 2 (two) times daily. Applied to eczematous lesions  . VENTOLIN HFA 108 (90 Base) MCG/ACT inhaler INHALE 2 PUFFS  INTO THE LUNGS EVERY 6 HOURS AS NEEDED FOR WHEEZING ORSHORTNESS OF BREATH  . [DISCONTINUED] benzonatate (TESSALON) 100 MG capsule Take 1 capsule (100 mg total) by mouth 3 (three) times daily as needed for cough. (Patient not taking: Reported on 01/04/2017)   No facility-administered encounter medications on file as of 01/21/2017.     Allergies as of 01/21/2017 - Review Complete 01/21/2017  Allergen Reaction Noted  . Atacand [candesartan] Anaphylaxis 12/07/2012  . Lisinopril Anaphylaxis 12/07/2012  . Nsaids Anaphylaxis 12/07/2012  . Penicillins Rash 12/07/2012    Past Medical  History:  Diagnosis Date  . Anemia   . Asthma   . ESRD (end stage renal disease) ([place])    MWF  . GERD (gastroesophageal reflux disease)   . Gout   . 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 Heavy menstrual bleeding   . High cholesterol   . History of blood transfusion 02/2016- last one   1990's- several ones   . History of kidney stones 1980's  . Hyperlipemia 12/07/2012  . Hypertension   . Hypothyroidism   . Kidney stones   . Pre-diabetes   . Prediabetes   . RA (rheumatoid arthritis) ([place])   . Sleep apnea    [place]- have CPAP, not able to use every night     Past Surgical History:  Procedure Laterality Date  . A/V SHUNTOGRAM N/A 09/08/2016   Procedure: A/V Shuntogram - Left Arm AV Graft;  Surgeon: Serafina Mitchell, MD;  Location: [place] [facility];  Service: Cardiovascular;  Laterality: N/A;  . ABDOMINAL HYSTERECTOMY  2000  . AV FISTULA PLACEMENT Left 05/25/2016   Procedure: INSERTION OF LEFT UPPER ARM ARTERIOVENOUS (AV) LOOP GORE-TEX GRAFT ARM;  Surgeon: Elam Dutch, MD;  Location: [place];  Service: Vascular;  Laterality: Left;  . BASCILIC VEIN TRANSPOSITION Right 12/06/2014   Procedure: FIRST STAGE BASILIC VEIN TRANSPOSITION - RIGHT;  Surgeon: Serafina Mitchell, MD;  Location: [place];  Service: Vascular;  Laterality: Right;  . BASCILIC VEIN TRANSPOSITION Right 02/07/2015   Procedure: RIGHT ARM SECOND STAGE BASILIC VEIN TRANSPOSITION;  Surgeon: Serafina Mitchell, MD;  Location: [place];  Service: Vascular;  Laterality: Right;  . BASCILIC VEIN TRANSPOSITION Left 04/22/2016   Procedure: FIRST STAGE BASILIC VEIN TRANSPOSITION LEFT ARM;  Surgeon: Conrad Egypt, MD;  Location: [place];  Service: Vascular;  Laterality: Left;  . COLONOSCOPY W/ POLYPECTOMY    . ECTOPIC PREGNANCY SURGERY  02/1982  . EXCHANGE OF A DIALYSIS CATHETER Right 04/22/2016   Procedure: EXCHANGE OF A DIALYSIS CATHETER - INSERTION RIGHT INTERNAL JUGULAR & REMOVAL FROM LEFT INTERNAL JUGULAR;  Surgeon:  Conrad [place], MD;  Location: [place];  Service: Vascular;  Laterality: Right;  . I&D EXTREMITY Right 02/07/2015   Procedure: IRRIGATION AND DEBRIDEMENT RIGHT ARM HEMATOMA;  Surgeon: Serafina Mitchell, MD;  Location: [place];  Service: Vascular;  Laterality: Right;  . INSERTION OF DIALYSIS CATHETER  03/03/2016   Procedure: INSERTION OF DIALYSIS CATHETER;  Surgeon: Elam Dutch, MD;  Location: [place];  Service: Vascular;;  . IR THROMBECTOMY AV FISTULA W/THROMBOLYSIS/PTA INC/SHUNT/IMG LEFT Left 01/04/2017  . IR US GUIDE VASC ACCESS LEFT  01/04/2017  . left shoulder surgery  08/2005  . LIGATION OF ARTERIOVENOUS  FISTULA  03/03/2016   Procedure: LIGATION OF ARTERIOVENOUS  FISTULA;  Surgeon: Elam Dutch, MD;  Location: [place];  Service: Vascular;;  . PERIPHERAL VASCULAR BALLOON ANGIOPLASTY  09/08/2016  Procedure: Peripheral Vascular Balloon Angioplasty;  Surgeon: Serafina Mitchell, MD;  Location: [place] [facility];  Service: Cardiovascular;;  left avf  . TEE WITHOUT CARDIOVERSION N/A 03/06/2016   Procedure: TRANSESOPHAGEAL ECHOCARDIOGRAM (TEE);  Surgeon: Sueanne Margarita, MD;  Location: [facility] ENDOSCOPY;  Service: Cardiovascular;  Laterality: N/A;  . TUBAL LIGATION  11/1986    Family History  Problem Relation Age of Onset  . Hypertension Mother   . Breast cancer Maternal Aunt   . Deep vein thrombosis Daughter   . Hyperlipidemia Daughter   . Hypertension Daughter   . Prostate cancer Maternal Grandmother     Social History   Social History  . Marital status: Legally Separated    Spouse name: N/A  . Number of children: 3  . Years of education: N/A   Occupational History  . Not on file.   Social History Main Topics  . Smoking status: Never Smoker  . Smokeless tobacco: Never Used  . Alcohol use 0.0 oz/week     Comment: occ - 1 drink rarely  . Drug use: No  . Sexual activity: Not on file   Other Topics Concern  . 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  HENT: Negative.   Eyes: Negative for blurred vision.  Respiratory: as per HPI  Cardiovascular: Negative for chest pain and palpitations.  Gastrointestinal: Negative for vomiting, diarrhea, blood per rectum. Genitourinary: Negative for dysuria, urgency, frequency and hematuria.  Musculoskeletal: Negative for myalgias, back pain and joint pain.  Skin: Negative for itching and rash.  Neurological: Negative for dizziness, tremors, focal weakness, seizures and loss of consciousness.  Endo/Heme/Allergies: Negative for environmental allergies.  Psychiatric/Behavioral: Negative for depression, suicidal ideas and hallucinations.  All other systems reviewed and are negative.  Physical Exam: Blood pressure (!) 142/88, pulse 100, height 5\' 2"  (1.575 m), weight 181 lb (82.1 kg), SpO2 98 %. Gen:      No acute distress HEENT:  EOMI, sclera anicteric Neck:     No masses; no thyromegaly Lungs:    Clear to auscultation bilaterally; normal respiratory effort CV:         Regular rate and rhythm; no murmurs Abd:      + bowel sounds; soft, non-tender; no palpable masses, no distension Ext:    No edema; adequate peripheral perfusion Skin:      Warm and dry; no rash Neuro: alert and oriented x 3 Psych: normal mood and affect  Data Reviewed: FENO 01/21/17- 9  Chest x-ray 12/16/16-no acute cardiopulmonary disease. I have reviewed the images personally  Sleep Study 05/15/15 Moderate to severe obstructive sleep apnea that is much worse in REM sleep (overall AHI 29.8, REM AHI 74.4) associated with oxygen desaturations to a low of 87%.    Titration study 07/28/2015 CPAP 11 with heated humidity for nasal dryness. Mask: [place]  Quattro-M.  2. Prolonged sleep latency may be in part related to laboratory effect, but the patient should be questioned about other etiologies of sleep disruption. For instance GERD, stimulants (caffeine) and irregular sleep schedule, etc.    Assessment:  Assessment for chronic cough She has a history of childhood asthma but her symptoms are not very typical.  FENO is low today Has not responded to prednisone in the past.  We will give her a trial of Symbicort and check pulmonary function test with bronchodilator response.  Check CBC differential and blood allergy profile  Suspect that her acid reflux esophagitis may be playing a big role in her symptoms.  I have asked her to follow back with [facility].  She will call to make an appointment.  Severe OSA, untreated She will need repeat sleep study to reinitiate CPAP therapy.  She prefers to get this done in [place].  Plan/Recommendations: - PFTs, CBC with differential, blood allergy profile -Trial Symbicort 160/4.5, continue albuterol inhaler - PPI therapy, [facility] - Sleep study  Marshell Garfinkel MD [facility] Pager [phone] 01/21/2017, 10:50 AM  CC: Arnoldo Morale, MD

## 2017-01-22 DIAGNOSIS — D631 Anemia in chronic kidney disease: Secondary | ICD-10-CM | POA: Diagnosis not present

## 2017-01-22 DIAGNOSIS — D509 Iron deficiency anemia, unspecified: Secondary | ICD-10-CM | POA: Diagnosis not present

## 2017-01-22 DIAGNOSIS — N186 End stage renal disease: Secondary | ICD-10-CM | POA: Diagnosis not present

## 2017-01-22 DIAGNOSIS — N2581 Secondary hyperparathyroidism of renal origin: Secondary | ICD-10-CM | POA: Diagnosis not present

## 2017-01-22 DIAGNOSIS — E1129 Type 2 diabetes mellitus with other diabetic kidney complication: Secondary | ICD-10-CM | POA: Diagnosis not present

## 2017-01-22 LAB — RESPIRATORY ALLERGY PROFILE REGION II ~~LOC~~
Allergen, A. alternata, m6: <0.1 kU/L
Allergen, Cedar tree, t12: <0.1 kU/L
Allergen, Comm Silver Birch, t9: <0.1 kU/L
Allergen, Cottonwood, t14: <0.1 kU/L
Allergen, D pternoyssinus,d7: <0.1 kU/L
Allergen, Mouse Urine Protein, e78: <0.1 kU/L
Allergen, Mulberry, t76: <0.1 kU/L
Allergen, Oak,t7: <0.1 kU/L
Allergen, P. notatum, m1: <0.1 kU/L
Aspergillus fumigatus, m3: <0.1 kU/L
Bermuda Grass: <0.1 kU/L
Box Elder IgE: <0.1 kU/L
CLADOSPORIUM HERBARUM (M2) IGE: <0.1 kU/L
COMMON RAGWEED (SHORT) (W1) IGE: <0.1 kU/L
Cat Dander: <0.1 kU/L
Class: 0
Class: 0
Class: 0
Class: 0
Class: 0
Class: 0
Class: 0
Class: 0
Class: 0
Class: 0
Class: 0
Class: 0
Class: 0
Class: 0
Class: 0
Class: 0
Class: 0
Class: 0
Class: 0
Class: 0
Class: 0
Class: 0
Class: 0
Class: 1
Cockroach: 0.64 kU/L — ABNORMAL HIGH
D. farinae: <0.1 kU/L
Dog Dander: <0.1 kU/L
Elm IgE: <0.1 kU/L
IgE (Immunoglobulin E), Serum: 36 kU/L (ref <=114)
Johnson Grass: <0.1 kU/L
Pecan/Hickory Tree IgE: <0.1 kU/L
Rough Pigweed  IgE: <0.1 kU/L
Sheep Sorrel IgE: <0.1 kU/L
Timothy Grass: <0.1 kU/L

## 2017-01-22 LAB — INTERPRETATION:

## 2017-01-25 DIAGNOSIS — N186 End stage renal disease: Secondary | ICD-10-CM | POA: Diagnosis not present

## 2017-01-25 DIAGNOSIS — N2581 Secondary hyperparathyroidism of renal origin: Secondary | ICD-10-CM | POA: Diagnosis not present

## 2017-01-25 DIAGNOSIS — E1129 Type 2 diabetes mellitus with other diabetic kidney complication: Secondary | ICD-10-CM | POA: Diagnosis not present

## 2017-01-25 DIAGNOSIS — D509 Iron deficiency anemia, unspecified: Secondary | ICD-10-CM | POA: Diagnosis not present

## 2017-01-25 DIAGNOSIS — D631 Anemia in chronic kidney disease: Secondary | ICD-10-CM | POA: Diagnosis not present

## 2017-01-28 ENCOUNTER — Other Ambulatory Visit: Payer: Self-pay | Admitting: Family Medicine

## 2017-01-28 DIAGNOSIS — R059 Cough, unspecified: Secondary | ICD-10-CM

## 2017-01-28 DIAGNOSIS — R05 Cough: Secondary | ICD-10-CM

## 2017-01-29 DIAGNOSIS — N2581 Secondary hyperparathyroidism of renal origin: Secondary | ICD-10-CM | POA: Diagnosis not present

## 2017-01-29 DIAGNOSIS — D509 Iron deficiency anemia, unspecified: Secondary | ICD-10-CM | POA: Diagnosis not present

## 2017-01-29 DIAGNOSIS — E1129 Type 2 diabetes mellitus with other diabetic kidney complication: Secondary | ICD-10-CM | POA: Diagnosis not present

## 2017-01-29 DIAGNOSIS — N186 End stage renal disease: Secondary | ICD-10-CM | POA: Diagnosis not present

## 2017-01-29 DIAGNOSIS — D631 Anemia in chronic kidney disease: Secondary | ICD-10-CM | POA: Diagnosis not present

## 2017-02-01 DIAGNOSIS — N2581 Secondary hyperparathyroidism of renal origin: Secondary | ICD-10-CM | POA: Diagnosis not present

## 2017-02-01 DIAGNOSIS — D509 Iron deficiency anemia, unspecified: Secondary | ICD-10-CM | POA: Diagnosis not present

## 2017-02-01 DIAGNOSIS — E1129 Type 2 diabetes mellitus with other diabetic kidney complication: Secondary | ICD-10-CM | POA: Diagnosis not present

## 2017-02-01 DIAGNOSIS — D631 Anemia in chronic kidney disease: Secondary | ICD-10-CM | POA: Diagnosis not present

## 2017-02-01 DIAGNOSIS — N186 End stage renal disease: Secondary | ICD-10-CM | POA: Diagnosis not present

## 2017-02-03 DIAGNOSIS — N186 End stage renal disease: Secondary | ICD-10-CM | POA: Diagnosis not present

## 2017-02-03 DIAGNOSIS — D509 Iron deficiency anemia, unspecified: Secondary | ICD-10-CM | POA: Diagnosis not present

## 2017-02-03 DIAGNOSIS — D631 Anemia in chronic kidney disease: Secondary | ICD-10-CM | POA: Diagnosis not present

## 2017-02-03 DIAGNOSIS — E1129 Type 2 diabetes mellitus with other diabetic kidney complication: Secondary | ICD-10-CM | POA: Diagnosis not present

## 2017-02-03 DIAGNOSIS — N2581 Secondary hyperparathyroidism of renal origin: Secondary | ICD-10-CM | POA: Diagnosis not present

## 2017-02-05 DIAGNOSIS — D631 Anemia in chronic kidney disease: Secondary | ICD-10-CM | POA: Diagnosis not present

## 2017-02-05 DIAGNOSIS — D509 Iron deficiency anemia, unspecified: Secondary | ICD-10-CM | POA: Diagnosis not present

## 2017-02-05 DIAGNOSIS — N2581 Secondary hyperparathyroidism of renal origin: Secondary | ICD-10-CM | POA: Diagnosis not present

## 2017-02-05 DIAGNOSIS — E1129 Type 2 diabetes mellitus with other diabetic kidney complication: Secondary | ICD-10-CM | POA: Diagnosis not present

## 2017-02-05 DIAGNOSIS — N186 End stage renal disease: Secondary | ICD-10-CM | POA: Diagnosis not present

## 2017-02-07 DIAGNOSIS — D509 Iron deficiency anemia, unspecified: Secondary | ICD-10-CM | POA: Diagnosis not present

## 2017-02-07 DIAGNOSIS — E1129 Type 2 diabetes mellitus with other diabetic kidney complication: Secondary | ICD-10-CM | POA: Diagnosis not present

## 2017-02-07 DIAGNOSIS — N186 End stage renal disease: Secondary | ICD-10-CM | POA: Diagnosis not present

## 2017-02-07 DIAGNOSIS — N2581 Secondary hyperparathyroidism of renal origin: Secondary | ICD-10-CM | POA: Diagnosis not present

## 2017-02-07 DIAGNOSIS — D631 Anemia in chronic kidney disease: Secondary | ICD-10-CM | POA: Diagnosis not present

## 2017-02-09 DIAGNOSIS — N186 End stage renal disease: Secondary | ICD-10-CM | POA: Diagnosis not present

## 2017-02-09 DIAGNOSIS — N2581 Secondary hyperparathyroidism of renal origin: Secondary | ICD-10-CM | POA: Diagnosis not present

## 2017-02-09 DIAGNOSIS — D509 Iron deficiency anemia, unspecified: Secondary | ICD-10-CM | POA: Diagnosis not present

## 2017-02-09 DIAGNOSIS — E1129 Type 2 diabetes mellitus with other diabetic kidney complication: Secondary | ICD-10-CM | POA: Diagnosis not present

## 2017-02-09 DIAGNOSIS — D631 Anemia in chronic kidney disease: Secondary | ICD-10-CM | POA: Diagnosis not present

## 2017-02-12 DIAGNOSIS — N186 End stage renal disease: Secondary | ICD-10-CM | POA: Diagnosis not present

## 2017-02-12 DIAGNOSIS — D631 Anemia in chronic kidney disease: Secondary | ICD-10-CM | POA: Diagnosis not present

## 2017-02-12 DIAGNOSIS — D509 Iron deficiency anemia, unspecified: Secondary | ICD-10-CM | POA: Diagnosis not present

## 2017-02-12 DIAGNOSIS — E1129 Type 2 diabetes mellitus with other diabetic kidney complication: Secondary | ICD-10-CM | POA: Diagnosis not present

## 2017-02-12 DIAGNOSIS — N2581 Secondary hyperparathyroidism of renal origin: Secondary | ICD-10-CM | POA: Diagnosis not present

## 2017-02-15 DIAGNOSIS — N186 End stage renal disease: Secondary | ICD-10-CM | POA: Diagnosis not present

## 2017-02-15 DIAGNOSIS — D631 Anemia in chronic kidney disease: Secondary | ICD-10-CM | POA: Diagnosis not present

## 2017-02-15 DIAGNOSIS — D509 Iron deficiency anemia, unspecified: Secondary | ICD-10-CM | POA: Diagnosis not present

## 2017-02-15 DIAGNOSIS — N2581 Secondary hyperparathyroidism of renal origin: Secondary | ICD-10-CM | POA: Diagnosis not present

## 2017-02-15 DIAGNOSIS — E1129 Type 2 diabetes mellitus with other diabetic kidney complication: Secondary | ICD-10-CM | POA: Diagnosis not present

## 2017-02-17 DIAGNOSIS — N2581 Secondary hyperparathyroidism of renal origin: Secondary | ICD-10-CM | POA: Diagnosis not present

## 2017-02-17 DIAGNOSIS — D631 Anemia in chronic kidney disease: Secondary | ICD-10-CM | POA: Diagnosis not present

## 2017-02-17 DIAGNOSIS — E1129 Type 2 diabetes mellitus with other diabetic kidney complication: Secondary | ICD-10-CM | POA: Diagnosis not present

## 2017-02-17 DIAGNOSIS — N186 End stage renal disease: Secondary | ICD-10-CM | POA: Diagnosis not present

## 2017-02-17 DIAGNOSIS — D509 Iron deficiency anemia, unspecified: Secondary | ICD-10-CM | POA: Diagnosis not present

## 2017-02-19 DIAGNOSIS — N186 End stage renal disease: Secondary | ICD-10-CM | POA: Diagnosis not present

## 2017-02-19 DIAGNOSIS — N2581 Secondary hyperparathyroidism of renal origin: Secondary | ICD-10-CM | POA: Diagnosis not present

## 2017-02-19 DIAGNOSIS — E1129 Type 2 diabetes mellitus with other diabetic kidney complication: Secondary | ICD-10-CM | POA: Diagnosis not present

## 2017-02-19 DIAGNOSIS — D631 Anemia in chronic kidney disease: Secondary | ICD-10-CM | POA: Diagnosis not present

## 2017-02-19 DIAGNOSIS — Z992 Dependence on renal dialysis: Secondary | ICD-10-CM | POA: Diagnosis not present

## 2017-02-19 DIAGNOSIS — I129 Hypertensive chronic kidney disease with stage 1 through stage 4 chronic kidney disease, or unspecified chronic kidney disease: Secondary | ICD-10-CM | POA: Diagnosis not present

## 2017-02-19 DIAGNOSIS — D509 Iron deficiency anemia, unspecified: Secondary | ICD-10-CM | POA: Diagnosis not present

## 2017-02-24 DIAGNOSIS — D631 Anemia in chronic kidney disease: Secondary | ICD-10-CM | POA: Diagnosis not present

## 2017-02-24 DIAGNOSIS — N186 End stage renal disease: Secondary | ICD-10-CM | POA: Diagnosis not present

## 2017-02-24 DIAGNOSIS — D509 Iron deficiency anemia, unspecified: Secondary | ICD-10-CM | POA: Diagnosis not present

## 2017-02-24 DIAGNOSIS — Z4931 Encounter for adequacy testing for hemodialysis: Secondary | ICD-10-CM | POA: Diagnosis not present

## 2017-02-24 DIAGNOSIS — N2581 Secondary hyperparathyroidism of renal origin: Secondary | ICD-10-CM | POA: Diagnosis not present

## 2017-02-24 DIAGNOSIS — E1129 Type 2 diabetes mellitus with other diabetic kidney complication: Secondary | ICD-10-CM | POA: Diagnosis not present

## 2017-02-26 DIAGNOSIS — D509 Iron deficiency anemia, unspecified: Secondary | ICD-10-CM | POA: Diagnosis not present

## 2017-02-26 DIAGNOSIS — E1129 Type 2 diabetes mellitus with other diabetic kidney complication: Secondary | ICD-10-CM | POA: Diagnosis not present

## 2017-02-26 DIAGNOSIS — Z4931 Encounter for adequacy testing for hemodialysis: Secondary | ICD-10-CM | POA: Diagnosis not present

## 2017-02-26 DIAGNOSIS — N186 End stage renal disease: Secondary | ICD-10-CM | POA: Diagnosis not present

## 2017-02-26 DIAGNOSIS — N2581 Secondary hyperparathyroidism of renal origin: Secondary | ICD-10-CM | POA: Diagnosis not present

## 2017-03-02 ENCOUNTER — Ambulatory Visit: Payer: Medicare Other | Admitting: Family Medicine

## 2017-03-03 DIAGNOSIS — E1129 Type 2 diabetes mellitus with other diabetic kidney complication: Secondary | ICD-10-CM | POA: Diagnosis not present

## 2017-03-03 DIAGNOSIS — Z4931 Encounter for adequacy testing for hemodialysis: Secondary | ICD-10-CM | POA: Diagnosis not present

## 2017-03-03 DIAGNOSIS — N186 End stage renal disease: Secondary | ICD-10-CM | POA: Diagnosis not present

## 2017-03-03 DIAGNOSIS — D509 Iron deficiency anemia, unspecified: Secondary | ICD-10-CM | POA: Diagnosis not present

## 2017-03-03 DIAGNOSIS — N2581 Secondary hyperparathyroidism of renal origin: Secondary | ICD-10-CM | POA: Diagnosis not present

## 2017-03-05 ENCOUNTER — Ambulatory Visit (HOSPITAL_BASED_OUTPATIENT_CLINIC_OR_DEPARTMENT_OTHER): Payer: Medicare Other | Attending: Pulmonary Disease | Admitting: Pulmonary Disease

## 2017-03-05 VITALS — Ht 62.0 in | Wt 180.0 lb

## 2017-03-05 DIAGNOSIS — E1129 Type 2 diabetes mellitus with other diabetic kidney complication: Secondary | ICD-10-CM | POA: Diagnosis not present

## 2017-03-05 DIAGNOSIS — N186 End stage renal disease: Secondary | ICD-10-CM | POA: Diagnosis not present

## 2017-03-05 DIAGNOSIS — G4761 Periodic limb movement disorder: Secondary | ICD-10-CM | POA: Diagnosis not present

## 2017-03-05 DIAGNOSIS — Z4931 Encounter for adequacy testing for hemodialysis: Secondary | ICD-10-CM | POA: Diagnosis not present

## 2017-03-05 DIAGNOSIS — Z79899 Other long term (current) drug therapy: Secondary | ICD-10-CM | POA: Insufficient documentation

## 2017-03-05 DIAGNOSIS — R0683 Snoring: Secondary | ICD-10-CM | POA: Insufficient documentation

## 2017-03-05 DIAGNOSIS — G4733 Obstructive sleep apnea (adult) (pediatric): Secondary | ICD-10-CM | POA: Diagnosis not present

## 2017-03-05 DIAGNOSIS — R000 Tachycardia, unspecified: Secondary | ICD-10-CM | POA: Insufficient documentation

## 2017-03-05 DIAGNOSIS — G4730 Sleep apnea, unspecified: Secondary | ICD-10-CM | POA: Diagnosis present

## 2017-03-05 DIAGNOSIS — D509 Iron deficiency anemia, unspecified: Secondary | ICD-10-CM | POA: Diagnosis not present

## 2017-03-05 DIAGNOSIS — N2581 Secondary hyperparathyroidism of renal origin: Secondary | ICD-10-CM | POA: Diagnosis not present

## 2017-03-08 DIAGNOSIS — N186 End stage renal disease: Secondary | ICD-10-CM | POA: Diagnosis not present

## 2017-03-08 DIAGNOSIS — N2581 Secondary hyperparathyroidism of renal origin: Secondary | ICD-10-CM | POA: Diagnosis not present

## 2017-03-08 DIAGNOSIS — E1129 Type 2 diabetes mellitus with other diabetic kidney complication: Secondary | ICD-10-CM | POA: Diagnosis not present

## 2017-03-08 DIAGNOSIS — D509 Iron deficiency anemia, unspecified: Secondary | ICD-10-CM | POA: Diagnosis not present

## 2017-03-08 DIAGNOSIS — Z4931 Encounter for adequacy testing for hemodialysis: Secondary | ICD-10-CM | POA: Diagnosis not present

## 2017-03-09 ENCOUNTER — Ambulatory Visit: Payer: Medicare Other | Admitting: Family Medicine

## 2017-03-10 DIAGNOSIS — N186 End stage renal disease: Secondary | ICD-10-CM | POA: Diagnosis not present

## 2017-03-10 DIAGNOSIS — Z4931 Encounter for adequacy testing for hemodialysis: Secondary | ICD-10-CM | POA: Diagnosis not present

## 2017-03-10 DIAGNOSIS — N2581 Secondary hyperparathyroidism of renal origin: Secondary | ICD-10-CM | POA: Diagnosis not present

## 2017-03-10 DIAGNOSIS — E1129 Type 2 diabetes mellitus with other diabetic kidney complication: Secondary | ICD-10-CM | POA: Diagnosis not present

## 2017-03-10 DIAGNOSIS — D509 Iron deficiency anemia, unspecified: Secondary | ICD-10-CM | POA: Diagnosis not present

## 2017-03-12 DIAGNOSIS — E1129 Type 2 diabetes mellitus with other diabetic kidney complication: Secondary | ICD-10-CM | POA: Diagnosis not present

## 2017-03-12 DIAGNOSIS — Z4931 Encounter for adequacy testing for hemodialysis: Secondary | ICD-10-CM | POA: Diagnosis not present

## 2017-03-12 DIAGNOSIS — D509 Iron deficiency anemia, unspecified: Secondary | ICD-10-CM | POA: Diagnosis not present

## 2017-03-12 DIAGNOSIS — N186 End stage renal disease: Secondary | ICD-10-CM | POA: Diagnosis not present

## 2017-03-12 DIAGNOSIS — N2581 Secondary hyperparathyroidism of renal origin: Secondary | ICD-10-CM | POA: Diagnosis not present

## 2017-03-17 DIAGNOSIS — G4733 Obstructive sleep apnea (adult) (pediatric): Secondary | ICD-10-CM | POA: Diagnosis not present

## 2017-03-17 DIAGNOSIS — G4761 Periodic limb movement disorder: Secondary | ICD-10-CM | POA: Insufficient documentation

## 2017-03-17 DIAGNOSIS — Z4931 Encounter for adequacy testing for hemodialysis: Secondary | ICD-10-CM | POA: Diagnosis not present

## 2017-03-17 DIAGNOSIS — E1129 Type 2 diabetes mellitus with other diabetic kidney complication: Secondary | ICD-10-CM | POA: Diagnosis not present

## 2017-03-17 DIAGNOSIS — D509 Iron deficiency anemia, unspecified: Secondary | ICD-10-CM | POA: Diagnosis not present

## 2017-03-17 DIAGNOSIS — N2581 Secondary hyperparathyroidism of renal origin: Secondary | ICD-10-CM | POA: Diagnosis not present

## 2017-03-17 DIAGNOSIS — N186 End stage renal disease: Secondary | ICD-10-CM | POA: Diagnosis not present

## 2017-03-17 NOTE — Procedures (Signed)
    Patient Name: Rachael George, Rachael George Date: 03/05/2017 Gender: Female D.O.B: 12-Oct-1955 Age (years): 5 Referring Provider: Marshell Garfinkel Height (inches): 61 Interpreting Physician: Chesley Mires MD, ABSM Weight (lbs): 180 RPSGT: Lanae Boast BMI: 33 MRN: [id number] Neck Size: 13.50  CLINICAL INFORMATION Sleep Study Type: NPSG  Indication for sleep study: She has history of severe sleep apnea, but hasn't been using CPAP.  She present to have further assessment of the status of her sleep apnea.  SLEEP STUDY TECHNIQUE As per the AASM Manual for the Scoring of Sleep and Associated Events v2.3 (April 2016) with a hypopnea requiring 4% desaturations.  The channels recorded and monitored were frontal, central and occipital EEG, electrooculogram (EOG), submentalis EMG (chin), nasal and oral airflow, thoracic and abdominal wall motion, anterior tibialis EMG, snore microphone, electrocardiogram, and pulse oximetry.  MEDICATIONS Medications self-administered by patient taken the night of the study : AMLODIPINE, OLMESARTAN, VENTOLIN HFA 108 MCG/ACT INHALER  SLEEP ARCHITECTURE The study was initiated at 10:14:25 PM and ended at 4:48:21 AM.  Sleep onset time was 13.7 minutes and the sleep efficiency was 50.3%. The total sleep time was 198.3 minutes.  Stage REM latency was 268.5 minutes.  The patient spent 25.86% of the night in stage N1 sleep, 50.18% in stage N2 sleep, 7.06% in stage N3 and 16.90% in REM.  Alpha intrusion was absent.  Supine sleep was 17.11%.  RESPIRATORY PARAMETERS The overall apnea/hypopnea index (AHI) was 6.7 per hour. There were 6 total apneas, including 5 obstructive, 1 central and 0 mixed apneas. There were 16 hypopneas and 3 RERAs.  The AHI during Stage REM sleep was 10.7 per hour.  AHI while supine was 24.8 per hour.  The mean oxygen saturation was 94.91%. The minimum SpO2 during sleep was 86.00%.  moderate snoring was noted during this  study.  CARDIAC DATA The 2 lead EKG demonstrated sinus rhythm. The mean heart rate was 92.08 beats per minute. Other EKG findings include: Sinus Tachycardia.  LEG MOVEMENT DATA The total PLMS were 191 with a resulting PLMS index of 57.80. Associated arousal with leg movement index was 16.3 .  IMPRESSIONS - Mild obstructive sleep apnea occurred during this study (AHI = 6.7/h). - Mild oxygen desaturation was noted during this study (Min O2 = 86.00%). - The patient snored with moderate snoring volume. - Severe periodic limb movements of sleep occurred during the study. Associated arousals were significant.  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 - She should be assessed for the presence of restless leg syndrome.  [Electronically signed] 03/17/2017 05:26 PM  Chesley Mires MD, [place], American Board of Sleep Medicine   NPI: [id number]

## 2017-03-19 DIAGNOSIS — N2581 Secondary hyperparathyroidism of renal origin: Secondary | ICD-10-CM | POA: Diagnosis not present

## 2017-03-19 DIAGNOSIS — E1129 Type 2 diabetes mellitus with other diabetic kidney complication: Secondary | ICD-10-CM | POA: Diagnosis not present

## 2017-03-19 DIAGNOSIS — N186 End stage renal disease: Secondary | ICD-10-CM | POA: Diagnosis not present

## 2017-03-19 DIAGNOSIS — D509 Iron deficiency anemia, unspecified: Secondary | ICD-10-CM | POA: Diagnosis not present

## 2017-03-19 DIAGNOSIS — Z4931 Encounter for adequacy testing for hemodialysis: Secondary | ICD-10-CM | POA: Diagnosis not present

## 2017-03-21 DIAGNOSIS — Z4931 Encounter for adequacy testing for hemodialysis: Secondary | ICD-10-CM | POA: Diagnosis not present

## 2017-03-21 DIAGNOSIS — N186 End stage renal disease: Secondary | ICD-10-CM | POA: Diagnosis not present

## 2017-03-21 DIAGNOSIS — D509 Iron deficiency anemia, unspecified: Secondary | ICD-10-CM | POA: Diagnosis not present

## 2017-03-21 DIAGNOSIS — N2581 Secondary hyperparathyroidism of renal origin: Secondary | ICD-10-CM | POA: Diagnosis not present

## 2017-03-21 DIAGNOSIS — E1129 Type 2 diabetes mellitus with other diabetic kidney complication: Secondary | ICD-10-CM | POA: Diagnosis not present

## 2017-03-22 ENCOUNTER — Telehealth: Payer: Self-pay

## 2017-03-22 DIAGNOSIS — G4733 Obstructive sleep apnea (adult) (pediatric): Secondary | ICD-10-CM

## 2017-03-22 DIAGNOSIS — Z992 Dependence on renal dialysis: Secondary | ICD-10-CM | POA: Diagnosis not present

## 2017-03-22 DIAGNOSIS — I129 Hypertensive chronic kidney disease with stage 1 through stage 4 chronic kidney disease, or unspecified chronic kidney disease: Secondary | ICD-10-CM | POA: Diagnosis not present

## 2017-03-22 DIAGNOSIS — N186 End stage renal disease: Secondary | ICD-10-CM | POA: Diagnosis not present

## 2017-03-22 MED ORDER — BUDESONIDE-FORMOTEROL FUMARATE 160-4.5 MCG/ACT IN AERO: 2.0000 | INHALATION_SPRAY | Freq: Two times a day (BID) | RESPIRATORY_TRACT | 3 refills | Status: DC

## 2017-03-22 NOTE — Telephone Encounter (Signed)
-----   Message from Marshell Garfinkel, MD sent at 03/18/2017 11:27 PM EST ----- Please order autoCPAP 5-15 cm. Thanks  ----- Message ----- From: Marshell Garfinkel, MD Sent: 03/17/2017   5:28 PM To: Arnoldo Morale, MD, Marshell Garfinkel, MD

## 2017-03-22 NOTE — Telephone Encounter (Signed)
Pt is aware of results and voiced her understanding.  Order has been placed for cpap machine. Pt had pending apt with PM on 04/06/16, apt has been cancelled due to being too soon after cpap setup. Pt has been scheduled for 05/20/16 with PM.   Pt request that symbicort 160 be sent to pharmacy, as this medication seems effective. Symb 160 has been sent to preferred pharmacy Nothing further is needed.

## 2017-03-24 DIAGNOSIS — D509 Iron deficiency anemia, unspecified: Secondary | ICD-10-CM | POA: Diagnosis not present

## 2017-03-24 DIAGNOSIS — D631 Anemia in chronic kidney disease: Secondary | ICD-10-CM | POA: Diagnosis not present

## 2017-03-24 DIAGNOSIS — E1129 Type 2 diabetes mellitus with other diabetic kidney complication: Secondary | ICD-10-CM | POA: Diagnosis not present

## 2017-03-24 DIAGNOSIS — N2581 Secondary hyperparathyroidism of renal origin: Secondary | ICD-10-CM | POA: Diagnosis not present

## 2017-03-24 DIAGNOSIS — N186 End stage renal disease: Secondary | ICD-10-CM | POA: Diagnosis not present

## 2017-03-26 ENCOUNTER — Other Ambulatory Visit: Payer: Self-pay | Admitting: Family Medicine

## 2017-03-26 DIAGNOSIS — E1129 Type 2 diabetes mellitus with other diabetic kidney complication: Secondary | ICD-10-CM | POA: Diagnosis not present

## 2017-03-26 DIAGNOSIS — D631 Anemia in chronic kidney disease: Secondary | ICD-10-CM | POA: Diagnosis not present

## 2017-03-26 DIAGNOSIS — D509 Iron deficiency anemia, unspecified: Secondary | ICD-10-CM | POA: Diagnosis not present

## 2017-03-26 DIAGNOSIS — N186 End stage renal disease: Secondary | ICD-10-CM | POA: Diagnosis not present

## 2017-03-26 DIAGNOSIS — N2581 Secondary hyperparathyroidism of renal origin: Secondary | ICD-10-CM | POA: Diagnosis not present

## 2017-03-29 DIAGNOSIS — N2581 Secondary hyperparathyroidism of renal origin: Secondary | ICD-10-CM | POA: Diagnosis not present

## 2017-03-29 DIAGNOSIS — D509 Iron deficiency anemia, unspecified: Secondary | ICD-10-CM | POA: Diagnosis not present

## 2017-03-29 DIAGNOSIS — D631 Anemia in chronic kidney disease: Secondary | ICD-10-CM | POA: Diagnosis not present

## 2017-03-29 DIAGNOSIS — E1129 Type 2 diabetes mellitus with other diabetic kidney complication: Secondary | ICD-10-CM | POA: Diagnosis not present

## 2017-03-29 DIAGNOSIS — N186 End stage renal disease: Secondary | ICD-10-CM | POA: Diagnosis not present

## 2017-03-30 ENCOUNTER — Ambulatory Visit: Payer: Medicare Other | Attending: Family Medicine | Admitting: Family Medicine

## 2017-03-30 ENCOUNTER — Encounter: Payer: Self-pay | Admitting: Family Medicine

## 2017-03-30 VITALS — BP 130/80 | HR 99 | Temp 98.0°F | Ht 62.0 in | Wt 178.6 lb

## 2017-03-30 DIAGNOSIS — I10 Essential (primary) hypertension: Secondary | ICD-10-CM | POA: Diagnosis not present

## 2017-03-30 DIAGNOSIS — M069 Rheumatoid arthritis, unspecified: Secondary | ICD-10-CM | POA: Insufficient documentation

## 2017-03-30 DIAGNOSIS — R059 Cough, unspecified: Secondary | ICD-10-CM

## 2017-03-30 DIAGNOSIS — R05 Cough: Secondary | ICD-10-CM

## 2017-03-30 DIAGNOSIS — E785 Hyperlipidemia, unspecified: Secondary | ICD-10-CM | POA: Insufficient documentation

## 2017-03-30 DIAGNOSIS — Z7951 Long term (current) use of inhaled steroids: Secondary | ICD-10-CM | POA: Insufficient documentation

## 2017-03-30 DIAGNOSIS — K219 Gastro-esophageal reflux disease without esophagitis: Secondary | ICD-10-CM | POA: Insufficient documentation

## 2017-03-30 DIAGNOSIS — Z79899 Other long term (current) drug therapy: Secondary | ICD-10-CM | POA: Insufficient documentation

## 2017-03-30 DIAGNOSIS — Z87442 Personal history of urinary calculi: Secondary | ICD-10-CM | POA: Insufficient documentation

## 2017-03-30 DIAGNOSIS — Z886 Allergy status to analgesic agent status: Secondary | ICD-10-CM | POA: Insufficient documentation

## 2017-03-30 DIAGNOSIS — M545 Low back pain, unspecified: Secondary | ICD-10-CM

## 2017-03-30 DIAGNOSIS — J4520 Mild intermittent asthma, uncomplicated: Secondary | ICD-10-CM | POA: Diagnosis not present

## 2017-03-30 DIAGNOSIS — R0609 Other forms of dyspnea: Secondary | ICD-10-CM | POA: Diagnosis not present

## 2017-03-30 DIAGNOSIS — M109 Gout, unspecified: Secondary | ICD-10-CM | POA: Diagnosis not present

## 2017-03-30 DIAGNOSIS — R7303 Prediabetes: Secondary | ICD-10-CM | POA: Diagnosis not present

## 2017-03-30 DIAGNOSIS — G8929 Other chronic pain: Secondary | ICD-10-CM | POA: Diagnosis not present

## 2017-03-30 DIAGNOSIS — Z888 Allergy status to other drugs, medicaments and biological substances status: Secondary | ICD-10-CM | POA: Diagnosis not present

## 2017-03-30 DIAGNOSIS — N186 End stage renal disease: Secondary | ICD-10-CM | POA: Diagnosis not present

## 2017-03-30 DIAGNOSIS — Z992 Dependence on renal dialysis: Secondary | ICD-10-CM | POA: Diagnosis not present

## 2017-03-30 DIAGNOSIS — Z880 Allergy status to penicillin: Secondary | ICD-10-CM | POA: Diagnosis not present

## 2017-03-30 DIAGNOSIS — E039 Hypothyroidism, unspecified: Secondary | ICD-10-CM | POA: Insufficient documentation

## 2017-03-30 DIAGNOSIS — R0982 Postnasal drip: Secondary | ICD-10-CM | POA: Insufficient documentation

## 2017-03-30 DIAGNOSIS — G4733 Obstructive sleep apnea (adult) (pediatric): Secondary | ICD-10-CM | POA: Insufficient documentation

## 2017-03-30 DIAGNOSIS — Z90710 Acquired absence of both cervix and uterus: Secondary | ICD-10-CM | POA: Diagnosis not present

## 2017-03-30 DIAGNOSIS — I120 Hypertensive chronic kidney disease with stage 5 chronic kidney disease or end stage renal disease: Secondary | ICD-10-CM | POA: Insufficient documentation

## 2017-03-30 DIAGNOSIS — Z9862 Peripheral vascular angioplasty status: Secondary | ICD-10-CM | POA: Diagnosis not present

## 2017-03-30 LAB — POCT GLYCOSYLATED HEMOGLOBIN (HGB A1C): Hemoglobin A1C: 6

## 2017-03-30 MED ORDER — GABAPENTIN 300 MG PO CAPS: 300.0000 mg | ORAL_CAPSULE | Freq: Two times a day (BID) | ORAL | 3 refills | Status: DC

## 2017-03-30 MED ORDER — CETIRIZINE HCL 10 MG PO TABS: ORAL_TABLET | ORAL | 3 refills | Status: DC

## 2017-03-30 MED ORDER — ALBUTEROL SULFATE (2.5 MG/3ML) 0.083% IN NEBU: 2.5000 mg | INHALATION_SOLUTION | Freq: Four times a day (QID) | RESPIRATORY_TRACT | 12 refills | Status: DC | PRN

## 2017-03-30 MED ORDER — CARVEDILOL 6.25 MG PO TABS: 6.2500 mg | ORAL_TABLET | Freq: Two times a day (BID) | ORAL | 3 refills | Status: DC

## 2017-03-30 MED ORDER — AZITHROMYCIN 250 MG PO TABS: ORAL_TABLET | ORAL | 0 refills | Status: DC

## 2017-03-31 ENCOUNTER — Encounter: Payer: Self-pay | Admitting: Family Medicine

## 2017-03-31 DIAGNOSIS — N186 End stage renal disease: Secondary | ICD-10-CM | POA: Diagnosis not present

## 2017-03-31 DIAGNOSIS — N2581 Secondary hyperparathyroidism of renal origin: Secondary | ICD-10-CM | POA: Diagnosis not present

## 2017-03-31 DIAGNOSIS — E1129 Type 2 diabetes mellitus with other diabetic kidney complication: Secondary | ICD-10-CM | POA: Diagnosis not present

## 2017-03-31 DIAGNOSIS — D631 Anemia in chronic kidney disease: Secondary | ICD-10-CM | POA: Diagnosis not present

## 2017-03-31 DIAGNOSIS — D509 Iron deficiency anemia, unspecified: Secondary | ICD-10-CM | POA: Diagnosis not present

## 2017-03-31 NOTE — Progress Notes (Signed)
Subjective:  Patient ID: Rachael George, female    DOB: 1955/06/13  Age: 62 y.o. MRN: [id number]  CC: Cough and Hypertension   HPI Rachael George is a 62 year old female with a history of prediabetes (A1c 6.0 diet-controlled) hypertension, hyperlipidemia, end-stage renal disease (on hemodialysis Mondays, Wednesdays and Fridays), Gout, asthma prediabetes who presents to the clinic for  a follow-up visit.  She complains of a chronic cough for over 2 months and is needing to use albuterol inhaler every 2 hours.  Complains of having to sit up to go to sleep and is now needing oxygen during her hemodialysis sessions complains of shortness of breath all the time but no chest pains; she does have intermittent wheezing. She was seen by pulmonary and commenced on Symbicort which she has been using with no improvement in symptoms. Also underwent a sleep study which revealed mild obstructive sleep apnea and she is in the process of obtaining a CPAP machine.  She has been compliant with her statin and denies myalgias or other side effects. She denies any recent gout flares. Denies pedal edema.  Past Medical History:  Diagnosis Date  . Anemia   . Asthma   . ESRD (end stage renal disease) ([place])    MWF  . GERD (gastroesophageal reflux disease)   . Gout   . 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 Heavy menstrual bleeding   . High cholesterol   . History of blood transfusion 02/2016- last one   1990's- several ones   . History of kidney stones 1980's  . Hyperlipemia 12/07/2012  . Hypertension   . Hypothyroidism   . Kidney stones   . Pre-diabetes   . Prediabetes   . RA (rheumatoid arthritis) ([place])   . Sleep apnea    [place]- have CPAP, not able to use every night     Past Surgical History:  Procedure Laterality Date  . A/V SHUNTOGRAM N/A 09/08/2016   Procedure: A/V Shuntogram - Left Arm AV Graft;  Surgeon: Serafina Mitchell, MD;  Location: [facility];   Service: Cardiovascular;  Laterality: N/A;  . ABDOMINAL HYSTERECTOMY  2000  . AV FISTULA PLACEMENT Left 05/25/2016   Procedure: INSERTION OF LEFT UPPER ARM ARTERIOVENOUS (AV) LOOP GORE-TEX GRAFT ARM;  Surgeon: Elam Dutch, MD;  Location: [facility];  Service: Vascular;  Laterality: Left;  . BASCILIC VEIN TRANSPOSITION Right 12/06/2014   Procedure: FIRST STAGE BASILIC VEIN TRANSPOSITION - RIGHT;  Surgeon: Serafina Mitchell, MD;  Location: [facility];  Service: Vascular;  Laterality: Right;  . BASCILIC VEIN TRANSPOSITION Right 02/07/2015   Procedure: RIGHT ARM SECOND STAGE BASILIC VEIN TRANSPOSITION;  Surgeon: Serafina Mitchell, MD;  Location: [facility];  Service: Vascular;  Laterality: Right;  . BASCILIC VEIN TRANSPOSITION Left 04/22/2016   Procedure: FIRST STAGE BASILIC VEIN TRANSPOSITION LEFT ARM;  Surgeon: Conrad Hand, MD;  Location: [facility];  Service: Vascular;  Laterality: Left;  . COLONOSCOPY W/ POLYPECTOMY    . ECTOPIC PREGNANCY SURGERY  02/1982  . EXCHANGE OF A DIALYSIS CATHETER Right 04/22/2016   Procedure: EXCHANGE OF A DIALYSIS CATHETER - INSERTION RIGHT INTERNAL JUGULAR & REMOVAL FROM LEFT INTERNAL JUGULAR;  Surgeon: Conrad Greenwood, MD;  Location: [facility];  Service: Vascular;  Laterality: Right;  . I&D EXTREMITY Right 02/07/2015   Procedure: IRRIGATION AND DEBRIDEMENT RIGHT ARM HEMATOMA;  Surgeon: Serafina Mitchell, MD;  Location: [facility];  Service: Vascular;  Laterality: Right;  . INSERTION OF DIALYSIS CATHETER  03/03/2016   Procedure: INSERTION OF DIALYSIS CATHETER;  Surgeon: Elam Dutch, MD;  Location: [facility];  Service: Vascular;;  . IR THROMBECTOMY AV FISTULA W/THROMBOLYSIS/PTA INC/SHUNT/IMG LEFT Left 01/04/2017  . IR US GUIDE VASC ACCESS LEFT  01/04/2017  . left shoulder surgery  08/2005  . LIGATION OF ARTERIOVENOUS  FISTULA  03/03/2016   Procedure: LIGATION OF ARTERIOVENOUS  FISTULA;  Surgeon: Elam Dutch, MD;  Location: [facility];  Service: Vascular;;  . PERIPHERAL VASCULAR BALLOON ANGIOPLASTY   09/08/2016   Procedure: Peripheral Vascular Balloon Angioplasty;  Surgeon: Serafina Mitchell, MD;  Location: [facility];  Service: Cardiovascular;;  left avf  . TEE WITHOUT CARDIOVERSION N/A 03/06/2016   Procedure: TRANSESOPHAGEAL ECHOCARDIOGRAM (TEE);  Surgeon: Sueanne Margarita, MD;  Location: [facility];  Service: Cardiovascular;  Laterality: N/A;  . TUBAL LIGATION  11/1986    Allergies  Allergen Reactions  . Atacand [Candesartan] Anaphylaxis    Swelling of the mouth and tongue.  Marland Kitchen Lisinopril Anaphylaxis    Swelling of the mouth and tongue.  . Nsaids Anaphylaxis    Swelling of the mouth and tongue.  Marland Kitchen Penicillins Rash    Has patient had a PCN reaction causing immediate rash, facial/tongue/throat swelling, SOB or lightheadedness with hypotension: No Has patient had a PCN reaction causing severe rash involving mucus membranes or skin necrosis: No Has patient had a PCN reaction that required hospitalization No Has patient had a PCN reaction occurring within the last 10 years: No If all of the above answers are "NO", then may proceed with Cephalosporin use.      Outpatient Medications Prior to Visit  Medication Sig Dispense Refill  . acetaminophen (TYLENOL) 500 MG tablet Take 1,000 mg by mouth every 6 (six) hours as needed for mild pain.     Marland Kitchen allopurinol (ZYLOPRIM) 100 MG tablet Take 100 mg by mouth 2 (two) times daily. Reported on 09/02/2015    . amLODipine (NORVASC) 10 MG tablet Take 10 mg by mouth at bedtime.     Marland Kitchen b complex-vitamin c-folic acid (NEPHRO-VITE) 0.8 MG TABS tablet Take 1 tablet by mouth every morning.     . cinacalcet (SENSIPAR) 60 MG tablet Take 60 mg by mouth every Monday, Wednesday, and Friday at 6 PM.     . clotrimazole (LOTRIMIN) 1 % cream Apply 1 application topically 2 (two) times daily. Apply to inner thighs 60 g 1  . colchicine 0.6 MG tablet Take 2 tabs (1.2mg ) at the onset of a gout flare, may repeat 1 tab (0.6mg ) if symptoms persist (Patient taking  differently: Take 0.6-1.2 mg by mouth 2 (two) times daily as needed (for gout flare up as instructed). Take 2 tabs (1.2mg ) at the onset of a gout flare, may repeat 1 tab (0.6mg ) if symptoms persist) 30 tablet 2  . cyclobenzaprine (FLEXERIL) 10 MG tablet Take 1 tablet (10 mg total) by mouth 2 (two) times daily as needed. For back pain 60 tablet 1  . famotidine (PEPCID AC MAXIMUM STRENGTH) 20 MG tablet Take 20 mg by mouth 2 (two) times daily.    . pantoprazole (PROTONIX) 40 MG tablet Take 40 mg by mouth 2 (two) times daily.    . sucroferric oxyhydroxide (VELPHORO) 500 MG chewable tablet Chew 1,000-1,500 mg by mouth See admin instructions. Take 3 tablets (15oomg) three times daily with meals, and 2 tablets (1000mg ) with snacks    . triamcinolone cream (KENALOG) 0.1 % Apply  1 application topically 2 (two) times daily. Applied to eczematous lesions 80 g 1  . VENTOLIN HFA 108 (90 Base) MCG/ACT inhaler INHALE 2 PUFFS INTO THE LUNGS EVERY 6 HOURS AS NEEDED FOR WHEEZING ORSHORTNESS OF BREATH 18 g 1  . cetirizine (ZYRTEC) 10 MG tablet TAKE ONE (1) TABLET BY MOUTH EVERY DAY 30 tablet 1  . gabapentin (NEURONTIN) 300 MG capsule Take 1 capsule (300 mg total) by mouth 2 (two) times daily. 60 capsule 3  . olmesartan (BENICAR) 20 MG tablet Take 20 mg by mouth daily with lunch.    . budesonide-formoterol (SYMBICORT) 160-4.5 MCG/ACT inhaler Inhale 2 puffs into the lungs 2 (two) times daily for 1 day. 1 Inhaler 3  . fluticasone (FLOVENT HFA) 110 MCG/ACT inhaler Inhale 1 puff into the lungs 2 (two) times daily. (Patient not taking: Reported on 03/30/2017) 1 Inhaler 12   No facility-administered medications prior to visit.     ROS Review of Systems  Constitutional: Negative for activity change, appetite change and fatigue.  HENT: Negative for congestion, sinus pressure and sore throat.   Eyes: Negative for visual disturbance.  Respiratory: Positive for cough and shortness of breath. Negative for chest tightness and  wheezing.   Cardiovascular: Negative for chest pain and palpitations.  Gastrointestinal: Negative for abdominal distention, abdominal pain and constipation.  Endocrine: Negative for polydipsia.  Genitourinary: Negative for dysuria and frequency.  Musculoskeletal: Negative for arthralgias and back pain.  Skin: Negative for rash.  Neurological: Negative for tremors, light-headedness and numbness.  Hematological: Does not bruise/bleed easily.  Psychiatric/Behavioral: Negative for agitation and behavioral problems.    Objective:  BP 130/80   Pulse 99   Temp 98 F (36.7 C) (Oral)   Ht 5\' 2"  (1.575 m)   Wt 178 lb 9.6 oz (81 kg)   SpO2 97%   BMI 32.67 kg/m   BP/Weight 03/30/2017 03/05/2017 38/03/8297  Systolic BP 371 - 696  Diastolic BP 80 - 88  Wt. (Lbs) 178.6 180 181  BMI 32.67 32.92 33.11      Physical Exam  Constitutional: She is oriented to person, place, and time. She appears well-developed and well-nourished.  HENT:  Post nasal drip  Cardiovascular: Normal rate, normal heart sounds and intact distal pulses.  No murmur heard. Pulmonary/Chest: Effort normal and breath sounds normal. She has no wheezes. She has no rales. She exhibits no tenderness.  Abdominal: Soft. Bowel sounds are normal. She exhibits no distension and no mass. There is no tenderness.  Musculoskeletal:  Left arm AV fistula with palpable thrill  Neurological: She is alert and oriented to person, place, and time.  Skin: Skin is warm and dry.  Psychiatric: She has a normal mood and affect.    Lab Results  Component Value Date   HGBA1C 6.0 03/30/2017     Assessment & Plan:   1. Mild intermittent asthma without complication Currently on Symbicort and albuterol but still complaining of cough Plan is for PFTs, follow-up with pulmonary - albuterol (PROVENTIL) (2.5 MG/3ML) 0.083% nebulizer solution; Take 3 mLs (2.5 mg total) by nebulization every 6 (six) hours as needed for wheezing or shortness of  breath.  Dispense: 75 mL; Refill: 12  2. Other form of dyspnea We will need to exclude cardiac etiology given orthopnea - ECHOCARDIOGRAM COMPLETE; Future  3. Cough Discontinue Benicar as cough could be ARB induced We will treat for postnasal drip and also add azithromycin given prolonged duration - cetirizine (ZYRTEC) 10 MG tablet; TAKE ONE (1) TABLET BY  MOUTH EVERY DAY  Dispense: 30 tablet; Refill: 3 - azithromycin (ZITHROMAX) 250 MG tablet; Take 2 tabs (500 mg) on day 1 then 1 tab (250 mg) on days 2-5  Dispense: 6 tablet; Refill: 0  4. Chronic right-sided low back pain without sciatica - gabapentin (NEURONTIN) 300 MG capsule; Take 1 capsule (300 mg total) by mouth 2 (two) times daily.  Dispense: 60 capsule; Refill: 3  5. End stage renal disease ([place]) Continue hemodialysis as per protocol  6. Prediabetes A1c of 6.0 Diabetic diet, lifestyle modifications - POCT glycosylated hemoglobin (Hb A1C)  7. Essential hypertension, benign Controlled Substitute Benicar with carvedilol due to cough Low-sodium diet - carvedilol (COREG) 6.25 MG tablet; Take 1 tablet (6.25 mg total) by mouth 2 (two) times daily with a meal.  Dispense: 60 tablet; Refill: 3   Meds ordered this encounter  Medications  . carvedilol (COREG) 6.25 MG tablet    Sig: Take 1 tablet (6.25 mg total) by mouth 2 (two) times daily with a meal.    Dispense:  60 tablet    Refill:  3    Discontinue Benicar  . albuterol (PROVENTIL) (2.5 MG/3ML) 0.083% nebulizer solution    Sig: Take 3 mLs (2.5 mg total) by nebulization every 6 (six) hours as needed for wheezing or shortness of breath.    Dispense:  75 mL    Refill:  12  . cetirizine (ZYRTEC) 10 MG tablet    Sig: TAKE ONE (1) TABLET BY MOUTH EVERY DAY    Dispense:  30 tablet    Refill:  3  . gabapentin (NEURONTIN) 300 MG capsule    Sig: Take 1 capsule (300 mg total) by mouth 2 (two) times daily.    Dispense:  60 capsule    Refill:  3  . azithromycin (ZITHROMAX) 250 MG  tablet    Sig: Take 2 tabs (500 mg) on day 1 then 1 tab (250 mg) on days 2-5    Dispense:  6 tablet    Refill:  0    Follow-up: Return in about 6 weeks (around 05/11/2017) for Follow-up of cough and shortness of breath.   Arnoldo Morale MD

## 2017-04-01 ENCOUNTER — Other Ambulatory Visit: Payer: Self-pay

## 2017-04-01 ENCOUNTER — Telehealth: Payer: Self-pay | Admitting: Family Medicine

## 2017-04-01 ENCOUNTER — Ambulatory Visit (HOSPITAL_COMMUNITY)
Admission: RE | Admit: 2017-04-01 | Discharge: 2017-04-01 | Disposition: A | Discharge status: Home / Self Care | Payer: Medicare Other | Source: Ambulatory Visit | Attending: Family Medicine | Admitting: Family Medicine

## 2017-04-01 DIAGNOSIS — R0609 Other forms of dyspnea: Secondary | ICD-10-CM | POA: Insufficient documentation

## 2017-04-01 DIAGNOSIS — R05 Cough: Secondary | ICD-10-CM | POA: Diagnosis not present

## 2017-04-01 DIAGNOSIS — I10 Essential (primary) hypertension: Secondary | ICD-10-CM | POA: Insufficient documentation

## 2017-04-01 DIAGNOSIS — K219 Gastro-esophageal reflux disease without esophagitis: Secondary | ICD-10-CM | POA: Diagnosis not present

## 2017-04-01 DIAGNOSIS — J4520 Mild intermittent asthma, uncomplicated: Secondary | ICD-10-CM

## 2017-04-01 MED ORDER — ALBUTEROL SULFATE (2.5 MG/3ML) 0.083% IN NEBU: 2.5000 mg | INHALATION_SOLUTION | Freq: Four times a day (QID) | RESPIRATORY_TRACT | 12 refills | Status: DC | PRN

## 2017-04-01 NOTE — Telephone Encounter (Signed)
Rx sent to [facility]

## 2017-04-01 NOTE — Telephone Encounter (Signed)
[facility] called requesting for albuterol (PROVENTIL) (2.5 MG/3ML) 0.083% nebulizer solution be sent to their pharmacy b/c it can not be transferred from the Outpatient pharmacy since pt. Has Medicare part B. [facility] [address]. Please f/u

## 2017-04-01 NOTE — Progress Notes (Signed)
  Echocardiogram 2D Echocardiogram has been performed.  Rachael George 04/01/2017, 8:41 AM

## 2017-04-02 DIAGNOSIS — N2581 Secondary hyperparathyroidism of renal origin: Secondary | ICD-10-CM | POA: Diagnosis not present

## 2017-04-02 DIAGNOSIS — E1129 Type 2 diabetes mellitus with other diabetic kidney complication: Secondary | ICD-10-CM | POA: Diagnosis not present

## 2017-04-02 DIAGNOSIS — D631 Anemia in chronic kidney disease: Secondary | ICD-10-CM | POA: Diagnosis not present

## 2017-04-02 DIAGNOSIS — D509 Iron deficiency anemia, unspecified: Secondary | ICD-10-CM | POA: Diagnosis not present

## 2017-04-02 DIAGNOSIS — N186 End stage renal disease: Secondary | ICD-10-CM | POA: Diagnosis not present

## 2017-04-05 DIAGNOSIS — D631 Anemia in chronic kidney disease: Secondary | ICD-10-CM | POA: Diagnosis not present

## 2017-04-05 DIAGNOSIS — D509 Iron deficiency anemia, unspecified: Secondary | ICD-10-CM | POA: Diagnosis not present

## 2017-04-05 DIAGNOSIS — N2581 Secondary hyperparathyroidism of renal origin: Secondary | ICD-10-CM | POA: Diagnosis not present

## 2017-04-05 DIAGNOSIS — E1129 Type 2 diabetes mellitus with other diabetic kidney complication: Secondary | ICD-10-CM | POA: Diagnosis not present

## 2017-04-05 DIAGNOSIS — N186 End stage renal disease: Secondary | ICD-10-CM | POA: Diagnosis not present

## 2017-04-06 ENCOUNTER — Ambulatory Visit: Payer: Medicare Other | Admitting: Pulmonary Disease

## 2017-04-07 DIAGNOSIS — N186 End stage renal disease: Secondary | ICD-10-CM | POA: Diagnosis not present

## 2017-04-07 DIAGNOSIS — N2581 Secondary hyperparathyroidism of renal origin: Secondary | ICD-10-CM | POA: Diagnosis not present

## 2017-04-07 DIAGNOSIS — D631 Anemia in chronic kidney disease: Secondary | ICD-10-CM | POA: Diagnosis not present

## 2017-04-07 DIAGNOSIS — D509 Iron deficiency anemia, unspecified: Secondary | ICD-10-CM | POA: Diagnosis not present

## 2017-04-07 DIAGNOSIS — E1129 Type 2 diabetes mellitus with other diabetic kidney complication: Secondary | ICD-10-CM | POA: Diagnosis not present

## 2017-04-12 DIAGNOSIS — D509 Iron deficiency anemia, unspecified: Secondary | ICD-10-CM | POA: Diagnosis not present

## 2017-04-12 DIAGNOSIS — E1129 Type 2 diabetes mellitus with other diabetic kidney complication: Secondary | ICD-10-CM | POA: Diagnosis not present

## 2017-04-12 DIAGNOSIS — N186 End stage renal disease: Secondary | ICD-10-CM | POA: Diagnosis not present

## 2017-04-12 DIAGNOSIS — D631 Anemia in chronic kidney disease: Secondary | ICD-10-CM | POA: Diagnosis not present

## 2017-04-12 DIAGNOSIS — N2581 Secondary hyperparathyroidism of renal origin: Secondary | ICD-10-CM | POA: Diagnosis not present

## 2017-04-14 DIAGNOSIS — D631 Anemia in chronic kidney disease: Secondary | ICD-10-CM | POA: Diagnosis not present

## 2017-04-14 DIAGNOSIS — N2581 Secondary hyperparathyroidism of renal origin: Secondary | ICD-10-CM | POA: Diagnosis not present

## 2017-04-14 DIAGNOSIS — N186 End stage renal disease: Secondary | ICD-10-CM | POA: Diagnosis not present

## 2017-04-14 DIAGNOSIS — E1129 Type 2 diabetes mellitus with other diabetic kidney complication: Secondary | ICD-10-CM | POA: Diagnosis not present

## 2017-04-14 DIAGNOSIS — D509 Iron deficiency anemia, unspecified: Secondary | ICD-10-CM | POA: Diagnosis not present

## 2017-04-16 ENCOUNTER — Other Ambulatory Visit (HOSPITAL_COMMUNITY): Payer: Self-pay | Admitting: Nephrology

## 2017-04-16 DIAGNOSIS — N2581 Secondary hyperparathyroidism of renal origin: Secondary | ICD-10-CM | POA: Diagnosis not present

## 2017-04-16 DIAGNOSIS — D509 Iron deficiency anemia, unspecified: Secondary | ICD-10-CM | POA: Diagnosis not present

## 2017-04-16 DIAGNOSIS — D631 Anemia in chronic kidney disease: Secondary | ICD-10-CM | POA: Diagnosis not present

## 2017-04-16 DIAGNOSIS — N186 End stage renal disease: Secondary | ICD-10-CM | POA: Diagnosis not present

## 2017-04-16 DIAGNOSIS — E1129 Type 2 diabetes mellitus with other diabetic kidney complication: Secondary | ICD-10-CM | POA: Diagnosis not present

## 2017-04-19 ENCOUNTER — Other Ambulatory Visit: Payer: Self-pay | Admitting: General Surgery

## 2017-04-19 DIAGNOSIS — N186 End stage renal disease: Secondary | ICD-10-CM | POA: Diagnosis not present

## 2017-04-19 DIAGNOSIS — N2581 Secondary hyperparathyroidism of renal origin: Secondary | ICD-10-CM | POA: Diagnosis not present

## 2017-04-19 DIAGNOSIS — D509 Iron deficiency anemia, unspecified: Secondary | ICD-10-CM | POA: Diagnosis not present

## 2017-04-19 DIAGNOSIS — D631 Anemia in chronic kidney disease: Secondary | ICD-10-CM | POA: Diagnosis not present

## 2017-04-19 DIAGNOSIS — E1129 Type 2 diabetes mellitus with other diabetic kidney complication: Secondary | ICD-10-CM | POA: Diagnosis not present

## 2017-04-20 ENCOUNTER — Other Ambulatory Visit (HOSPITAL_COMMUNITY): Payer: Self-pay | Admitting: Nephrology

## 2017-04-20 ENCOUNTER — Ambulatory Visit (HOSPITAL_COMMUNITY)
Admission: RE | Admit: 2017-04-20 | Discharge: 2017-04-20 | Disposition: A | Discharge status: Home / Self Care | Payer: Medicare Other | Source: Ambulatory Visit | Attending: Nephrology | Admitting: Nephrology

## 2017-04-20 ENCOUNTER — Encounter (HOSPITAL_COMMUNITY): Payer: Self-pay | Admitting: Interventional Radiology

## 2017-04-20 DIAGNOSIS — Y832 Surgical operation with anastomosis, bypass or graft as the cause of abnormal reaction of the patient, or of later complication, without mention of misadventure at the time of the procedure: Secondary | ICD-10-CM | POA: Diagnosis not present

## 2017-04-20 DIAGNOSIS — R05 Cough: Secondary | ICD-10-CM | POA: Diagnosis not present

## 2017-04-20 DIAGNOSIS — T82858A Stenosis of vascular prosthetic devices, implants and grafts, initial encounter: Secondary | ICD-10-CM | POA: Diagnosis not present

## 2017-04-20 DIAGNOSIS — N186 End stage renal disease: Secondary | ICD-10-CM | POA: Insufficient documentation

## 2017-04-20 HISTORY — PX: IR AV DIALY SHUNT INTRO NEEDLE/INTRACATH INITIAL W/PTA/IMG LEFT: IMG6103

## 2017-04-20 MED ORDER — LIDOCAINE HCL 1 % IJ SOLN
INTRAMUSCULAR | Status: AC
  Filled 2017-04-20: qty 20

## 2017-04-20 MED ORDER — IOPAMIDOL (ISOVUE-300) INJECTION 61%
INTRAVENOUS | Status: AC
  Administered 2017-04-20: 50 mL
  Filled 2017-04-20: qty 100

## 2017-04-20 MED ORDER — LIDOCAINE HCL 1 % IJ SOLN
INTRAMUSCULAR | Status: DC | PRN
  Administered 2017-04-20: 2 mL

## 2017-04-20 NOTE — Procedures (Signed)
Pre Procedure Dx: ESRD Post Procedure Dx: Same  Technically successful fistulogram with angioplasty.    EBL: Minimal  No immediate complications.  Jay Raynard Mapps, MD Pager #: [phone]  

## 2017-04-21 DIAGNOSIS — E1129 Type 2 diabetes mellitus with other diabetic kidney complication: Secondary | ICD-10-CM | POA: Diagnosis not present

## 2017-04-21 DIAGNOSIS — D631 Anemia in chronic kidney disease: Secondary | ICD-10-CM | POA: Diagnosis not present

## 2017-04-21 DIAGNOSIS — N186 End stage renal disease: Secondary | ICD-10-CM | POA: Diagnosis not present

## 2017-04-21 DIAGNOSIS — D509 Iron deficiency anemia, unspecified: Secondary | ICD-10-CM | POA: Diagnosis not present

## 2017-04-21 DIAGNOSIS — N2581 Secondary hyperparathyroidism of renal origin: Secondary | ICD-10-CM | POA: Diagnosis not present

## 2017-04-22 DIAGNOSIS — N186 End stage renal disease: Secondary | ICD-10-CM | POA: Diagnosis not present

## 2017-04-22 DIAGNOSIS — I129 Hypertensive chronic kidney disease with stage 1 through stage 4 chronic kidney disease, or unspecified chronic kidney disease: Secondary | ICD-10-CM | POA: Diagnosis not present

## 2017-04-22 DIAGNOSIS — Z992 Dependence on renal dialysis: Secondary | ICD-10-CM | POA: Diagnosis not present

## 2017-04-23 DIAGNOSIS — N2581 Secondary hyperparathyroidism of renal origin: Secondary | ICD-10-CM | POA: Diagnosis not present

## 2017-04-23 DIAGNOSIS — D631 Anemia in chronic kidney disease: Secondary | ICD-10-CM | POA: Diagnosis not present

## 2017-04-23 DIAGNOSIS — D509 Iron deficiency anemia, unspecified: Secondary | ICD-10-CM | POA: Diagnosis not present

## 2017-04-23 DIAGNOSIS — N186 End stage renal disease: Secondary | ICD-10-CM | POA: Diagnosis not present

## 2017-04-23 DIAGNOSIS — Z992 Dependence on renal dialysis: Secondary | ICD-10-CM | POA: Diagnosis not present

## 2017-04-23 DIAGNOSIS — E1129 Type 2 diabetes mellitus with other diabetic kidney complication: Secondary | ICD-10-CM | POA: Diagnosis not present

## 2017-04-23 DIAGNOSIS — I129 Hypertensive chronic kidney disease with stage 1 through stage 4 chronic kidney disease, or unspecified chronic kidney disease: Secondary | ICD-10-CM | POA: Diagnosis not present

## 2017-04-23 HISTORY — PX: NM MYOVIEW LTD: HXRAD82

## 2017-04-26 DIAGNOSIS — D631 Anemia in chronic kidney disease: Secondary | ICD-10-CM | POA: Diagnosis not present

## 2017-04-26 DIAGNOSIS — D509 Iron deficiency anemia, unspecified: Secondary | ICD-10-CM | POA: Diagnosis not present

## 2017-04-26 DIAGNOSIS — N2581 Secondary hyperparathyroidism of renal origin: Secondary | ICD-10-CM | POA: Diagnosis not present

## 2017-04-26 DIAGNOSIS — E1129 Type 2 diabetes mellitus with other diabetic kidney complication: Secondary | ICD-10-CM | POA: Diagnosis not present

## 2017-04-26 DIAGNOSIS — N186 End stage renal disease: Secondary | ICD-10-CM | POA: Diagnosis not present

## 2017-04-28 DIAGNOSIS — N2581 Secondary hyperparathyroidism of renal origin: Secondary | ICD-10-CM | POA: Diagnosis not present

## 2017-04-28 DIAGNOSIS — N186 End stage renal disease: Secondary | ICD-10-CM | POA: Diagnosis not present

## 2017-04-28 DIAGNOSIS — E1129 Type 2 diabetes mellitus with other diabetic kidney complication: Secondary | ICD-10-CM | POA: Diagnosis not present

## 2017-04-28 DIAGNOSIS — D509 Iron deficiency anemia, unspecified: Secondary | ICD-10-CM | POA: Diagnosis not present

## 2017-04-28 DIAGNOSIS — D631 Anemia in chronic kidney disease: Secondary | ICD-10-CM | POA: Diagnosis not present

## 2017-04-29 DIAGNOSIS — Z7682 Awaiting organ transplant status: Secondary | ICD-10-CM | POA: Diagnosis not present

## 2017-04-29 DIAGNOSIS — N200 Calculus of kidney: Secondary | ICD-10-CM | POA: Diagnosis not present

## 2017-04-29 DIAGNOSIS — Z01810 Encounter for preprocedural cardiovascular examination: Secondary | ICD-10-CM | POA: Diagnosis not present

## 2017-04-29 DIAGNOSIS — N281 Cyst of kidney, acquired: Secondary | ICD-10-CM | POA: Diagnosis not present

## 2017-04-29 DIAGNOSIS — Z01818 Encounter for other preprocedural examination: Secondary | ICD-10-CM | POA: Diagnosis not present

## 2017-04-29 DIAGNOSIS — I129 Hypertensive chronic kidney disease with stage 1 through stage 4 chronic kidney disease, or unspecified chronic kidney disease: Secondary | ICD-10-CM | POA: Diagnosis not present

## 2017-04-29 DIAGNOSIS — N261 Atrophy of kidney (terminal): Secondary | ICD-10-CM | POA: Diagnosis not present

## 2017-05-03 DIAGNOSIS — E1129 Type 2 diabetes mellitus with other diabetic kidney complication: Secondary | ICD-10-CM | POA: Diagnosis not present

## 2017-05-03 DIAGNOSIS — D631 Anemia in chronic kidney disease: Secondary | ICD-10-CM | POA: Diagnosis not present

## 2017-05-03 DIAGNOSIS — N186 End stage renal disease: Secondary | ICD-10-CM | POA: Diagnosis not present

## 2017-05-03 DIAGNOSIS — D509 Iron deficiency anemia, unspecified: Secondary | ICD-10-CM | POA: Diagnosis not present

## 2017-05-03 DIAGNOSIS — N2581 Secondary hyperparathyroidism of renal origin: Secondary | ICD-10-CM | POA: Diagnosis not present

## 2017-05-05 DIAGNOSIS — N2581 Secondary hyperparathyroidism of renal origin: Secondary | ICD-10-CM | POA: Diagnosis not present

## 2017-05-05 DIAGNOSIS — N186 End stage renal disease: Secondary | ICD-10-CM | POA: Diagnosis not present

## 2017-05-05 DIAGNOSIS — D509 Iron deficiency anemia, unspecified: Secondary | ICD-10-CM | POA: Diagnosis not present

## 2017-05-05 DIAGNOSIS — D631 Anemia in chronic kidney disease: Secondary | ICD-10-CM | POA: Diagnosis not present

## 2017-05-05 DIAGNOSIS — E1129 Type 2 diabetes mellitus with other diabetic kidney complication: Secondary | ICD-10-CM | POA: Diagnosis not present

## 2017-05-07 DIAGNOSIS — E1129 Type 2 diabetes mellitus with other diabetic kidney complication: Secondary | ICD-10-CM | POA: Diagnosis not present

## 2017-05-07 DIAGNOSIS — N2581 Secondary hyperparathyroidism of renal origin: Secondary | ICD-10-CM | POA: Diagnosis not present

## 2017-05-07 DIAGNOSIS — D509 Iron deficiency anemia, unspecified: Secondary | ICD-10-CM | POA: Diagnosis not present

## 2017-05-07 DIAGNOSIS — D631 Anemia in chronic kidney disease: Secondary | ICD-10-CM | POA: Diagnosis not present

## 2017-05-07 DIAGNOSIS — N186 End stage renal disease: Secondary | ICD-10-CM | POA: Diagnosis not present

## 2017-05-10 DIAGNOSIS — N186 End stage renal disease: Secondary | ICD-10-CM | POA: Diagnosis not present

## 2017-05-10 DIAGNOSIS — N2581 Secondary hyperparathyroidism of renal origin: Secondary | ICD-10-CM | POA: Diagnosis not present

## 2017-05-10 DIAGNOSIS — D631 Anemia in chronic kidney disease: Secondary | ICD-10-CM | POA: Diagnosis not present

## 2017-05-10 DIAGNOSIS — E1129 Type 2 diabetes mellitus with other diabetic kidney complication: Secondary | ICD-10-CM | POA: Diagnosis not present

## 2017-05-10 DIAGNOSIS — D509 Iron deficiency anemia, unspecified: Secondary | ICD-10-CM | POA: Diagnosis not present

## 2017-05-12 DIAGNOSIS — N2581 Secondary hyperparathyroidism of renal origin: Secondary | ICD-10-CM | POA: Diagnosis not present

## 2017-05-12 DIAGNOSIS — N186 End stage renal disease: Secondary | ICD-10-CM | POA: Diagnosis not present

## 2017-05-12 DIAGNOSIS — E1129 Type 2 diabetes mellitus with other diabetic kidney complication: Secondary | ICD-10-CM | POA: Diagnosis not present

## 2017-05-12 DIAGNOSIS — D509 Iron deficiency anemia, unspecified: Secondary | ICD-10-CM | POA: Diagnosis not present

## 2017-05-12 DIAGNOSIS — D631 Anemia in chronic kidney disease: Secondary | ICD-10-CM | POA: Diagnosis not present

## 2017-05-13 ENCOUNTER — Encounter: Payer: Self-pay | Admitting: Family Medicine

## 2017-05-13 ENCOUNTER — Ambulatory Visit: Payer: Medicare Other | Attending: Family Medicine | Admitting: Family Medicine

## 2017-05-13 VITALS — BP 127/77 | HR 91 | Temp 98.3°F | Ht 62.0 in | Wt 180.8 lb

## 2017-05-13 DIAGNOSIS — I120 Hypertensive chronic kidney disease with stage 5 chronic kidney disease or end stage renal disease: Secondary | ICD-10-CM | POA: Insufficient documentation

## 2017-05-13 DIAGNOSIS — Z79899 Other long term (current) drug therapy: Secondary | ICD-10-CM | POA: Insufficient documentation

## 2017-05-13 DIAGNOSIS — M109 Gout, unspecified: Secondary | ICD-10-CM | POA: Diagnosis not present

## 2017-05-13 DIAGNOSIS — R05 Cough: Secondary | ICD-10-CM | POA: Diagnosis not present

## 2017-05-13 DIAGNOSIS — Z992 Dependence on renal dialysis: Secondary | ICD-10-CM | POA: Insufficient documentation

## 2017-05-13 DIAGNOSIS — R059 Cough, unspecified: Secondary | ICD-10-CM

## 2017-05-13 DIAGNOSIS — J4520 Mild intermittent asthma, uncomplicated: Secondary | ICD-10-CM | POA: Diagnosis not present

## 2017-05-13 DIAGNOSIS — R7303 Prediabetes: Secondary | ICD-10-CM | POA: Diagnosis not present

## 2017-05-13 DIAGNOSIS — N186 End stage renal disease: Secondary | ICD-10-CM

## 2017-05-13 DIAGNOSIS — E785 Hyperlipidemia, unspecified: Secondary | ICD-10-CM | POA: Insufficient documentation

## 2017-05-13 NOTE — Patient Instructions (Signed)

## 2017-05-13 NOTE — Progress Notes (Signed)
Subjective:  Patient ID: Rachael George, female    DOB: 10/04/1955  Age: 62 y.o. MRN: [id number]  CC: Hypertension and Asthma   HPI Rachael George is a 62 year old female with a history of prediabetes (A1c 6.0 diet-controlled) hypertension, hyperlipidemia, end-stage renal disease (on hemodialysis Mondays, Wednesdays and Fridays), Gout, asthma prediabetes who presents to the clinic for  a follow-up of cough and dyspnea which was not controlled on Zyrtec and her Asthma inhalers.  At her last visit her ARB was switched to Carvedilol and she reports improvement in her cough. She continues to experience some dyspnea especially during dialysis. Echo from last month revealed well preserved EF.  Echo 03/2017: Study Conclusions  - Left ventricle: The cavity size was normal. Wall thickness was   increased in a pattern of moderate LVH. Systolic function was   normal. The estimated ejection fraction was in the range of 60%   to 65%. Wall motion was normal; there were no regional wall   motion abnormalities. Doppler parameters are consistent with   abnormal left ventricular relaxation (grade 1 diastolic   dysfunction). - Mitral valve: Mildly calcified annulus. - Pulmonary arteries: PA peak pressure: 32 mm Hg (S).  She has no additional concerns today.  Outpatient Medications Prior to Visit  Medication Sig Dispense Refill  . acetaminophen (TYLENOL) 500 MG tablet Take 1,000 mg by mouth every 6 (six) hours as needed for mild pain.     Marland Kitchen albuterol (PROVENTIL) (2.5 MG/3ML) 0.083% nebulizer solution Take 3 mLs (2.5 mg total) by nebulization every 6 (six) hours as needed for wheezing or shortness of breath. ICD 10:J45.20 75 mL 12  . allopurinol (ZYLOPRIM) 100 MG tablet Take 100 mg by mouth 2 (two) times daily. Reported on 09/02/2015    . amLODipine (NORVASC) 10 MG tablet Take 10 mg by mouth at bedtime.     Marland Kitchen b complex-vitamin c-folic acid (NEPHRO-VITE) 0.8 MG TABS tablet Take 1 tablet by mouth  every morning.     . carvedilol (COREG) 6.25 MG tablet Take 1 tablet (6.25 mg total) by mouth 2 (two) times daily with a meal. 60 tablet 3  . cetirizine (ZYRTEC) 10 MG tablet TAKE ONE (1) TABLET BY MOUTH EVERY DAY 30 tablet 3  . cinacalcet (SENSIPAR) 60 MG tablet Take 60 mg by mouth every Monday, Wednesday, and Friday at 6 PM.     . clotrimazole (LOTRIMIN) 1 % cream Apply 1 application topically 2 (two) times daily. Apply to inner thighs 60 g 1  . colchicine 0.6 MG tablet Take 2 tabs (1.2mg ) at the onset of a gout flare, may repeat 1 tab (0.6mg ) if symptoms persist (Patient taking differently: Take 0.6-1.2 mg by mouth 2 (two) times daily as needed (for gout flare up as instructed). Take 2 tabs (1.2mg ) at the onset of a gout flare, may repeat 1 tab (0.6mg ) if symptoms persist) 30 tablet 2  . cyclobenzaprine (FLEXERIL) 10 MG tablet Take 1 tablet (10 mg total) by mouth 2 (two) times daily as needed. For back pain 60 tablet 1  . famotidine (PEPCID AC MAXIMUM STRENGTH) 20 MG tablet Take 20 mg by mouth 2 (two) times daily.    Marland Kitchen gabapentin (NEURONTIN) 300 MG capsule Take 1 capsule (300 mg total) by mouth 2 (two) times daily. 60 capsule 3  . pantoprazole (PROTONIX) 40 MG tablet Take 40 mg by mouth 2 (two) times daily.    . sucroferric oxyhydroxide (VELPHORO) 500 MG chewable tablet Chew 1,000-1,500 mg  by mouth See admin instructions. Take 3 tablets (15oomg) three times daily with meals, and 2 tablets (1000mg ) with snacks    . triamcinolone cream (KENALOG) 0.1 % Apply 1 application topically 2 (two) times daily. Applied to eczematous lesions 80 g 1  . VENTOLIN HFA 108 (90 Base) MCG/ACT inhaler INHALE 2 PUFFS INTO THE LUNGS EVERY 6 HOURS AS NEEDED FOR WHEEZING ORSHORTNESS OF BREATH 18 g 1  . azithromycin (ZITHROMAX) 250 MG tablet Take 2 tabs (500 mg) on day 1 then 1 tab (250 mg) on days 2-5 6 tablet 0  . budesonide-formoterol (SYMBICORT) 160-4.5 MCG/ACT inhaler Inhale 2 puffs into the lungs 2 (two) times daily  for 1 day. 1 Inhaler 3   No facility-administered medications prior to visit.     ROS Review of Systems  Constitutional: Negative for activity change, appetite change and fatigue.  HENT: Negative for congestion, sinus pressure and sore throat.   Eyes: Negative for visual disturbance.  Respiratory: Positive for cough. Negative for chest tightness, shortness of breath and wheezing.   Cardiovascular: Negative for chest pain and palpitations.  Gastrointestinal: Negative for abdominal distention, abdominal pain and constipation.  Endocrine: Negative for polydipsia.  Genitourinary: Negative for dysuria and frequency.  Musculoskeletal: Negative for arthralgias and back pain.  Skin: Negative for rash.  Neurological: Negative for tremors, light-headedness and numbness.  Hematological: Does not bruise/bleed easily.  Psychiatric/Behavioral: Negative for agitation and behavioral problems.    Objective:  BP 127/77   Pulse 91   Temp 98.3 F (36.8 C) (Oral)   Ht 5\' 2"  (1.575 m)   Wt 180 lb 12.8 oz (82 kg)   SpO2 96%   BMI 33.07 kg/m   BP/Weight 05/13/2017 03/30/2017 75/12/2583  Systolic BP 277 824 -  Diastolic BP 77 80 -  Wt. (Lbs) 180.8 178.6 180  BMI 33.07 32.67 32.92      Physical Exam  Constitutional: She is oriented to person, place, and time. She appears well-developed and well-nourished.  Cardiovascular: Normal rate, normal heart sounds and intact distal pulses.  No murmur heard. Pulmonary/Chest: Effort normal and breath sounds normal. She has no wheezes. She has no rales. She exhibits no tenderness.  Abdominal: Soft. Bowel sounds are normal. She exhibits no distension and no mass. There is no tenderness.  Musculoskeletal: Normal range of motion.  L arm AV fistula with palpable thrill  Neurological: She is alert and oriented to person, place, and time.     Assessment & Plan:   1. Mild intermittent asthma without complication No acute exacerbation Continue Symbicort,  Ventolin   2. Cough Improved after discontinuation of ARB Continue Zyrtec Has follow up appointment with Pulmonary for PFT.  3. End stage renal disease ([place]) HD as per schedule   No orders of the defined types were placed in this encounter.   Follow-up: Return in about 3 months (around 08/10/2017) for follow up of chronic medical conditions.   Charlott Rakes MD

## 2017-05-14 DIAGNOSIS — N2581 Secondary hyperparathyroidism of renal origin: Secondary | ICD-10-CM | POA: Diagnosis not present

## 2017-05-14 DIAGNOSIS — E1129 Type 2 diabetes mellitus with other diabetic kidney complication: Secondary | ICD-10-CM | POA: Diagnosis not present

## 2017-05-14 DIAGNOSIS — N186 End stage renal disease: Secondary | ICD-10-CM | POA: Diagnosis not present

## 2017-05-14 DIAGNOSIS — D631 Anemia in chronic kidney disease: Secondary | ICD-10-CM | POA: Diagnosis not present

## 2017-05-14 DIAGNOSIS — D509 Iron deficiency anemia, unspecified: Secondary | ICD-10-CM | POA: Diagnosis not present

## 2017-05-17 DIAGNOSIS — N186 End stage renal disease: Secondary | ICD-10-CM | POA: Diagnosis not present

## 2017-05-17 DIAGNOSIS — E1129 Type 2 diabetes mellitus with other diabetic kidney complication: Secondary | ICD-10-CM | POA: Diagnosis not present

## 2017-05-17 DIAGNOSIS — N2581 Secondary hyperparathyroidism of renal origin: Secondary | ICD-10-CM | POA: Diagnosis not present

## 2017-05-17 DIAGNOSIS — D509 Iron deficiency anemia, unspecified: Secondary | ICD-10-CM | POA: Diagnosis not present

## 2017-05-17 DIAGNOSIS — D631 Anemia in chronic kidney disease: Secondary | ICD-10-CM | POA: Diagnosis not present

## 2017-05-19 DIAGNOSIS — D509 Iron deficiency anemia, unspecified: Secondary | ICD-10-CM | POA: Diagnosis not present

## 2017-05-19 DIAGNOSIS — N186 End stage renal disease: Secondary | ICD-10-CM | POA: Diagnosis not present

## 2017-05-19 DIAGNOSIS — E1129 Type 2 diabetes mellitus with other diabetic kidney complication: Secondary | ICD-10-CM | POA: Diagnosis not present

## 2017-05-19 DIAGNOSIS — N2581 Secondary hyperparathyroidism of renal origin: Secondary | ICD-10-CM | POA: Diagnosis not present

## 2017-05-19 DIAGNOSIS — D631 Anemia in chronic kidney disease: Secondary | ICD-10-CM | POA: Diagnosis not present

## 2017-05-20 ENCOUNTER — Encounter: Payer: Self-pay | Admitting: Pulmonary Disease

## 2017-05-20 ENCOUNTER — Ambulatory Visit (INDEPENDENT_AMBULATORY_CARE_PROVIDER_SITE_OTHER): Payer: Medicare Other | Admitting: Pulmonary Disease

## 2017-05-20 VITALS — BP 118/78 | HR 90 | Ht 62.0 in | Wt 180.8 lb

## 2017-05-20 DIAGNOSIS — G4733 Obstructive sleep apnea (adult) (pediatric): Secondary | ICD-10-CM

## 2017-05-20 DIAGNOSIS — R05 Cough: Secondary | ICD-10-CM | POA: Diagnosis not present

## 2017-05-20 DIAGNOSIS — R0602 Shortness of breath: Secondary | ICD-10-CM

## 2017-05-20 DIAGNOSIS — J4520 Mild intermittent asthma, uncomplicated: Secondary | ICD-10-CM

## 2017-05-20 DIAGNOSIS — R059 Cough, unspecified: Secondary | ICD-10-CM

## 2017-05-20 LAB — NITRIC OXIDE: Nitric Oxide: 14

## 2017-05-20 MED ORDER — FLUTICASONE PROPIONATE 50 MCG/ACT NA SUSP: 2.0000 | Freq: Every day | NASAL | 2 refills | Status: DC

## 2017-05-20 MED ORDER — CHLORPHENIRAMINE MALEATE 4 MG PO TABS: 4.0000 mg | ORAL_TABLET | Freq: Three times a day (TID) | ORAL | 0 refills | Status: DC

## 2017-05-20 MED ORDER — PEAK FLOW METER DEVI: 0 refills | Status: DC

## 2017-05-20 NOTE — Patient Instructions (Signed)
Schedule pulmonary function test for evaluation Congratulations on using the CPAP regularly Start chlorpheniramine 8 mg 3 times daily.  Use this instead of Zyrtec We will start on Flonase nasal spray Continue Protonix for acid reflux Follow-up in 3 months.

## 2017-05-20 NOTE — Progress Notes (Addendum)
Rachael George    [id number]    June 27, 1955  Primary Care Physician:Newlin, Charlane Ferretti, MD  Referring Physician: Charlott Rakes, MD [place], [place] [address]  Chief complaint: Follow up for cough  HPI: 62 year old with history of end-stage renal disease, asthma, rheumatoid arthritis, GERD Complains of cough for the past 8 weeks.  Symptoms are mostly at night and when she lays down.  Has mild dyspnea on exertion.  Denies any wheezing, sputum production, fevers, chills she had been treated with prednisone with no improvement in symptoms.  She has history of childhood asthma which improved as an adult.  She is currently on just albuterol rescue inhaler and Zyrtec which not helping with her symptoms. She has history of heartburn and multiple esophageal ulcers and sees [facility].  She has not followed up with them over the past year.  Currently on Protonix 40 mg twice daily but still has symptoms of heartburn and reflux.  History of severe sleep apnea [AHI 30], CPAP titrated to 11 by study done at [facility].  She had been noncompliant with CPAP and now requires a new study for initiation of therapy.  She is is on the waiting list for kidney transplant at [facility].  Interim History: Seen by [facility] and diagnosed with H. pylori infection by breath test patient is currently on antibiotics for this.  She continues on PPI twice daily Seen by primary care in January and [place] was changed to [place] with some improvement in symptoms Continues on the Symbicort but has not noticed any difference with inhaler Started on CPAP and is compliant with therapy.  Outpatient Encounter Medications as of 05/20/2017  Medication Sig  . acetaminophen (TYLENOL) 500 MG tablet Take 1,000 mg by mouth every 6 (six) hours as needed for mild pain.   Marland Kitchen albuterol (PROVENTIL) (2.5 MG/3ML) 0.083% nebulizer solution Take 3 mLs (2.5 mg total) by nebulization every 6 (six) hours as needed for wheezing or  shortness of breath. ICD 10:J45.20  . allopurinol (ZYLOPRIM) 100 MG tablet Take 100 mg by mouth 2 (two) times daily. Reported on 09/02/2015  . amLODipine (NORVASC) 10 MG tablet Take 10 mg by mouth at bedtime.   Marland Kitchen b complex-vitamin c-folic acid (NEPHRO-VITE) 0.8 MG TABS tablet Take 1 tablet by mouth every morning.   . carvedilol (COREG) 6.25 MG tablet Take 1 tablet (6.25 mg total) by mouth 2 (two) times daily with a meal.  . cetirizine (ZYRTEC) 10 MG tablet TAKE ONE (1) TABLET BY MOUTH EVERY DAY  . cinacalcet (SENSIPAR) 60 MG tablet Take 60 mg by mouth every Monday, Wednesday, and Friday at 6 PM.   . clotrimazole (LOTRIMIN) 1 % cream Apply 1 application topically 2 (two) times daily. Apply to inner thighs  . colchicine 0.6 MG tablet Take 2 tabs (1.2mg ) at the onset of a gout flare, may repeat 1 tab (0.6mg ) if symptoms persist (Patient taking differently: Take 0.6-1.2 mg by mouth 2 (two) times daily as needed (for gout flare up as instructed). Take 2 tabs (1.2mg ) at the onset of a gout flare, may repeat 1 tab (0.6mg ) if symptoms persist)  . cyclobenzaprine (FLEXERIL) 10 MG tablet Take 1 tablet (10 mg total) by mouth 2 (two) times daily as needed. For back pain  . famotidine (PEPCID AC MAXIMUM STRENGTH) 20 MG tablet Take 20 mg by mouth 2 (two) times daily.  Marland Kitchen gabapentin (NEURONTIN) 300 MG capsule Take 1 capsule (300 mg total)  by mouth 2 (two) times daily.  . pantoprazole (PROTONIX) 40 MG tablet Take 40 mg by mouth 2 (two) times daily.  . sucroferric oxyhydroxide (VELPHORO) 500 MG chewable tablet Chew 1,000-1,500 mg by mouth See admin instructions. Take 3 tablets (15oomg) three times daily with meals, and 2 tablets (1000mg ) with snacks  . triamcinolone cream (KENALOG) 0.1 % Apply 1 application topically 2 (two) times daily. Applied to eczematous lesions  . VENTOLIN HFA 108 (90 Base) MCG/ACT inhaler INHALE 2 PUFFS INTO THE LUNGS EVERY 6 HOURS AS NEEDED FOR WHEEZING ORSHORTNESS OF BREATH  .  budesonide-formoterol (SYMBICORT) 160-4.5 MCG/ACT inhaler Inhale 2 puffs into the lungs 2 (two) times daily for 1 day.   No facility-administered encounter medications on file as of 05/20/2017.     Allergies as of 05/20/2017 - Review Complete 05/20/2017  Allergen Reaction Noted  . Atacand [candesartan] Anaphylaxis 12/07/2012  . Lisinopril Anaphylaxis 12/07/2012  . Nsaids Anaphylaxis 12/07/2012  . Penicillins Rash 12/07/2012    Past Medical History:  Diagnosis Date  . Anemia   . Asthma   . ESRD (end stage renal disease) ([place])    MWF  . GERD (gastroesophageal reflux disease)   . Gout   . 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 Heavy menstrual bleeding   . High cholesterol   . History of blood transfusion 02/2016- last one   1990's- several ones   . History of kidney stones 1980's  . Hyperlipemia 12/07/2012  . Hypertension   . Hypothyroidism   . Kidney stones   . Pre-diabetes   . Prediabetes   . RA (rheumatoid arthritis) ([place])   . Sleep apnea    [facility]- have CPAP, not able to use every night     Past Surgical History:  Procedure Laterality Date  . A/V SHUNTOGRAM N/A 09/08/2016   Procedure: A/V Shuntogram - Left Arm AV Graft;  Surgeon: Serafina Mitchell, MD;  Location: [facility];  Service: Cardiovascular;  Laterality: N/A;  . ABDOMINAL HYSTERECTOMY  2000  . AV FISTULA PLACEMENT Left 05/25/2016   Procedure: INSERTION OF LEFT UPPER ARM ARTERIOVENOUS (AV) LOOP GORE-TEX GRAFT ARM;  Surgeon: Elam Dutch, MD;  Location: [facility];  Service: Vascular;  Laterality: Left;  . BASCILIC VEIN TRANSPOSITION Right 12/06/2014   Procedure: FIRST STAGE BASILIC VEIN TRANSPOSITION - RIGHT;  Surgeon: Serafina Mitchell, MD;  Location: [facility];  Service: Vascular;  Laterality: Right;  . BASCILIC VEIN TRANSPOSITION Right 02/07/2015   Procedure: RIGHT ARM SECOND STAGE BASILIC VEIN TRANSPOSITION;  Surgeon: Serafina Mitchell, MD;  Location: [facility];  Service: Vascular;  Laterality:  Right;  . BASCILIC VEIN TRANSPOSITION Left 04/22/2016   Procedure: FIRST STAGE BASILIC VEIN TRANSPOSITION LEFT ARM;  Surgeon: Conrad [place], MD;  Location: [facility];  Service: Vascular;  Laterality: Left;  . COLONOSCOPY W/ POLYPECTOMY    . ECTOPIC PREGNANCY SURGERY  02/1982  . EXCHANGE OF A DIALYSIS CATHETER Right 04/22/2016   Procedure: EXCHANGE OF A DIALYSIS CATHETER - INSERTION RIGHT INTERNAL JUGULAR & REMOVAL FROM LEFT INTERNAL JUGULAR;  Surgeon: Conrad Fairview, MD;  Location: [facility];  Service: Vascular;  Laterality: Right;  . I&D EXTREMITY Right 02/07/2015   Procedure: IRRIGATION AND DEBRIDEMENT RIGHT ARM HEMATOMA;  Surgeon: Serafina Mitchell, MD;  Location: [facility];  Service: Vascular;  Laterality: Right;  . INSERTION OF DIALYSIS CATHETER  03/03/2016   Procedure: INSERTION OF DIALYSIS CATHETER;  Surgeon: Elam Dutch,  MD;  Location: [facility];  Service: Vascular;;  . IR AV DIALY SHUNT INTRO NEEDLE/INTRACATH INITIAL W/PTA/IMG LEFT  04/20/2017  . IR THROMBECTOMY AV FISTULA W/THROMBOLYSIS/PTA INC/SHUNT/IMG LEFT Left 01/04/2017  . IR US GUIDE VASC ACCESS LEFT  01/04/2017  . left shoulder surgery  08/2005  . LIGATION OF ARTERIOVENOUS  FISTULA  03/03/2016   Procedure: LIGATION OF ARTERIOVENOUS  FISTULA;  Surgeon: Elam Dutch, MD;  Location: [facility];  Service: Vascular;;  . PERIPHERAL VASCULAR BALLOON ANGIOPLASTY  09/08/2016   Procedure: Peripheral Vascular Balloon Angioplasty;  Surgeon: Serafina Mitchell, MD;  Location: [facility];  Service: Cardiovascular;;  left avf  . TEE WITHOUT CARDIOVERSION N/A 03/06/2016   Procedure: TRANSESOPHAGEAL ECHOCARDIOGRAM (TEE);  Surgeon: Sueanne Margarita, MD;  Location: [facility];  Service: Cardiovascular;  Laterality: N/A;  . TUBAL LIGATION  11/1986    Family History  Problem Relation Age of Onset  . Hypertension Mother   . Breast cancer Maternal Aunt   . Deep vein thrombosis Daughter   . Hyperlipidemia Daughter   . Hypertension Daughter   . Prostate cancer  Maternal Grandmother     Social History   Socioeconomic History  . Marital status: Legally Separated    Spouse name: Not on file  . Number of children: 3  . Years of education: Not on file  . Highest education level: Not on file  Social Needs  . Financial resource strain: Not on file  . Food insecurity - worry: Not on file  . Food insecurity - inability: Not on file  . Transportation needs - medical: Not on file  . Transportation needs - non-medical: Not on file  Occupational History  . Not on file  Tobacco Use  . Smoking status: Never Smoker  . Smokeless tobacco: Never Used  Substance and Sexual Activity  . Alcohol use: Yes    Alcohol/week: 0.0 oz    Comment: occ - 1 drink rarely  . Drug use: No  . Sexual activity: Not on file  Other Topics Concern  . 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  HENT: Negative.   Eyes: Negative for blurred vision.  Respiratory: as per HPI  Cardiovascular: Negative for chest pain and palpitations.  Gastrointestinal: Negative for vomiting, diarrhea, blood per rectum. Genitourinary: Negative for dysuria, urgency, frequency and hematuria.  Musculoskeletal: Negative for myalgias, back pain and joint pain.  Skin: Negative for itching and rash.  Neurological: Negative for dizziness, tremors, focal weakness, seizures and loss of consciousness.  Endo/Heme/Allergies: Negative for environmental allergies.  Psychiatric/Behavioral: Negative for depression, suicidal ideas and hallucinations.  All other systems reviewed and are negative.  Physical Exam: Blood pressure 118/78, pulse 90, height 5\' 2"  (1.575 m), weight 180 lb 12.8 oz (82 kg), SpO2 99  %. Gen:      No acute distress HEENT:  EOMI, sclera anicteric Neck:     No masses; no thyromegaly Lungs:    Clear to auscultation bilaterally; normal respiratory effort CV:         Regular rate and rhythm; no murmurs Abd:      +  bowel sounds; soft, non-tender; no palpable masses, no distension Ext:    No edema; adequate peripheral perfusion Skin:      Warm and dry; no rash Neuro: alert and oriented x 3 Psych: normal mood and affect  Data Reviewed: FENO 01/21/17- 9  Chest x-ray 12/16/16-no acute cardiopulmonary disease. I have reviewed the images personally  Sleep Study 05/15/15 Moderate to severe obstructive sleep apnea that is much worse in REM sleep (overall AHI 29.8, REM AHI 74.4) associated with oxygen desaturations to a low of 87%.    Titration study 07/28/2015 CPAP 11 with heated humidity for nasal dryness. Mask: Res Med [place] Quattro-M.  2. Prolonged sleep latency may be in part related to laboratory effect, but the patient should be questioned about other etiologies of sleep disruption. For instance GERD, stimulants (caffeine) and irregular sleep schedule, etc.    FENO 05/20/17- 14  CBC 01/21/17-WBC 8.4, eos 2.1%, absolute eosinophil count 200 Blood allergy profile 01/21/17-IgE 36, RAST panel shows sensitivity to cockroach.  Assessment:  Assessment for chronic cough She has a history of childhood asthma but her symptoms are not very typical.  FENO is low today Has not responded to prednisone in the past.  Continue on Symbicort.  PFTs ordered and are pending.   Suspect component of upper airway cough from postnasal drip and GERD. Continue PPI twice daily and follows with GI for treatment of H. pylori We will be more aggressive treatment of postnasal drip.  Stop the Zyrtec and start chlorpheniramine 8 mg 3 times daily and Flonase nasal spray.  Severe OSA, untreated Compliant with CPAP with good response.  Continue therapy.  Plan/Recommendations: - PFTs - Symbicort 160/4.5,  continue albuterol inhaler - PPI therapy, Chlorpheniramine, flonase - CPAP  Marshell Garfinkel MD [facility] Pager [phone] 05/20/2017, 9:34 AM  CC: Charlott Rakes, MD

## 2017-05-21 DIAGNOSIS — D509 Iron deficiency anemia, unspecified: Secondary | ICD-10-CM | POA: Diagnosis not present

## 2017-05-21 DIAGNOSIS — E1129 Type 2 diabetes mellitus with other diabetic kidney complication: Secondary | ICD-10-CM | POA: Diagnosis not present

## 2017-05-21 DIAGNOSIS — Z992 Dependence on renal dialysis: Secondary | ICD-10-CM | POA: Diagnosis not present

## 2017-05-21 DIAGNOSIS — N2581 Secondary hyperparathyroidism of renal origin: Secondary | ICD-10-CM | POA: Diagnosis not present

## 2017-05-21 DIAGNOSIS — I129 Hypertensive chronic kidney disease with stage 1 through stage 4 chronic kidney disease, or unspecified chronic kidney disease: Secondary | ICD-10-CM | POA: Diagnosis not present

## 2017-05-21 DIAGNOSIS — N186 End stage renal disease: Secondary | ICD-10-CM | POA: Diagnosis not present

## 2017-05-24 DIAGNOSIS — N2581 Secondary hyperparathyroidism of renal origin: Secondary | ICD-10-CM | POA: Diagnosis not present

## 2017-05-24 DIAGNOSIS — E1129 Type 2 diabetes mellitus with other diabetic kidney complication: Secondary | ICD-10-CM | POA: Diagnosis not present

## 2017-05-24 DIAGNOSIS — D509 Iron deficiency anemia, unspecified: Secondary | ICD-10-CM | POA: Diagnosis not present

## 2017-05-24 DIAGNOSIS — N186 End stage renal disease: Secondary | ICD-10-CM | POA: Diagnosis not present

## 2017-05-26 DIAGNOSIS — E1129 Type 2 diabetes mellitus with other diabetic kidney complication: Secondary | ICD-10-CM | POA: Diagnosis not present

## 2017-05-26 DIAGNOSIS — N2581 Secondary hyperparathyroidism of renal origin: Secondary | ICD-10-CM | POA: Diagnosis not present

## 2017-05-26 DIAGNOSIS — N186 End stage renal disease: Secondary | ICD-10-CM | POA: Diagnosis not present

## 2017-05-26 DIAGNOSIS — D509 Iron deficiency anemia, unspecified: Secondary | ICD-10-CM | POA: Diagnosis not present

## 2017-05-28 DIAGNOSIS — E1129 Type 2 diabetes mellitus with other diabetic kidney complication: Secondary | ICD-10-CM | POA: Diagnosis not present

## 2017-05-28 DIAGNOSIS — D688 Other specified coagulation defects: Secondary | ICD-10-CM | POA: Insufficient documentation

## 2017-05-28 DIAGNOSIS — D509 Iron deficiency anemia, unspecified: Secondary | ICD-10-CM | POA: Diagnosis not present

## 2017-05-28 DIAGNOSIS — Z111 Encounter for screening for respiratory tuberculosis: Secondary | ICD-10-CM | POA: Insufficient documentation

## 2017-05-28 DIAGNOSIS — R0602 Shortness of breath: Secondary | ICD-10-CM | POA: Insufficient documentation

## 2017-05-28 DIAGNOSIS — L299 Pruritus, unspecified: Secondary | ICD-10-CM | POA: Insufficient documentation

## 2017-05-28 DIAGNOSIS — E1151 Type 2 diabetes mellitus with diabetic peripheral angiopathy without gangrene: Secondary | ICD-10-CM | POA: Insufficient documentation

## 2017-05-28 DIAGNOSIS — R197 Diarrhea, unspecified: Secondary | ICD-10-CM | POA: Insufficient documentation

## 2017-05-28 DIAGNOSIS — N2581 Secondary hyperparathyroidism of renal origin: Secondary | ICD-10-CM | POA: Diagnosis not present

## 2017-05-28 DIAGNOSIS — N186 End stage renal disease: Secondary | ICD-10-CM | POA: Diagnosis not present

## 2017-05-28 DIAGNOSIS — R509 Fever, unspecified: Secondary | ICD-10-CM | POA: Insufficient documentation

## 2017-05-31 DIAGNOSIS — E1129 Type 2 diabetes mellitus with other diabetic kidney complication: Secondary | ICD-10-CM | POA: Diagnosis not present

## 2017-05-31 DIAGNOSIS — N2581 Secondary hyperparathyroidism of renal origin: Secondary | ICD-10-CM | POA: Diagnosis not present

## 2017-05-31 DIAGNOSIS — N186 End stage renal disease: Secondary | ICD-10-CM | POA: Diagnosis not present

## 2017-05-31 DIAGNOSIS — D631 Anemia in chronic kidney disease: Secondary | ICD-10-CM | POA: Diagnosis not present

## 2017-06-02 DIAGNOSIS — N2581 Secondary hyperparathyroidism of renal origin: Secondary | ICD-10-CM | POA: Diagnosis not present

## 2017-06-02 DIAGNOSIS — D631 Anemia in chronic kidney disease: Secondary | ICD-10-CM | POA: Diagnosis not present

## 2017-06-02 DIAGNOSIS — R52 Pain, unspecified: Secondary | ICD-10-CM | POA: Insufficient documentation

## 2017-06-02 DIAGNOSIS — E1129 Type 2 diabetes mellitus with other diabetic kidney complication: Secondary | ICD-10-CM | POA: Diagnosis not present

## 2017-06-02 DIAGNOSIS — N186 End stage renal disease: Secondary | ICD-10-CM | POA: Diagnosis not present

## 2017-06-04 DIAGNOSIS — N186 End stage renal disease: Secondary | ICD-10-CM | POA: Diagnosis not present

## 2017-06-04 DIAGNOSIS — N2581 Secondary hyperparathyroidism of renal origin: Secondary | ICD-10-CM | POA: Diagnosis not present

## 2017-06-04 DIAGNOSIS — D631 Anemia in chronic kidney disease: Secondary | ICD-10-CM | POA: Diagnosis not present

## 2017-06-04 DIAGNOSIS — E1129 Type 2 diabetes mellitus with other diabetic kidney complication: Secondary | ICD-10-CM | POA: Diagnosis not present

## 2017-06-07 DIAGNOSIS — E1129 Type 2 diabetes mellitus with other diabetic kidney complication: Secondary | ICD-10-CM | POA: Diagnosis not present

## 2017-06-07 DIAGNOSIS — D631 Anemia in chronic kidney disease: Secondary | ICD-10-CM | POA: Diagnosis not present

## 2017-06-07 DIAGNOSIS — N2581 Secondary hyperparathyroidism of renal origin: Secondary | ICD-10-CM | POA: Diagnosis not present

## 2017-06-07 DIAGNOSIS — N186 End stage renal disease: Secondary | ICD-10-CM | POA: Diagnosis not present

## 2017-06-09 DIAGNOSIS — E1129 Type 2 diabetes mellitus with other diabetic kidney complication: Secondary | ICD-10-CM | POA: Diagnosis not present

## 2017-06-09 DIAGNOSIS — D631 Anemia in chronic kidney disease: Secondary | ICD-10-CM | POA: Diagnosis not present

## 2017-06-09 DIAGNOSIS — N2581 Secondary hyperparathyroidism of renal origin: Secondary | ICD-10-CM | POA: Diagnosis not present

## 2017-06-09 DIAGNOSIS — N186 End stage renal disease: Secondary | ICD-10-CM | POA: Diagnosis not present

## 2017-06-11 DIAGNOSIS — N2581 Secondary hyperparathyroidism of renal origin: Secondary | ICD-10-CM | POA: Diagnosis not present

## 2017-06-11 DIAGNOSIS — N186 End stage renal disease: Secondary | ICD-10-CM | POA: Diagnosis not present

## 2017-06-11 DIAGNOSIS — E1129 Type 2 diabetes mellitus with other diabetic kidney complication: Secondary | ICD-10-CM | POA: Diagnosis not present

## 2017-06-11 DIAGNOSIS — D631 Anemia in chronic kidney disease: Secondary | ICD-10-CM | POA: Diagnosis not present

## 2017-06-14 DIAGNOSIS — N186 End stage renal disease: Secondary | ICD-10-CM | POA: Diagnosis not present

## 2017-06-14 DIAGNOSIS — N2581 Secondary hyperparathyroidism of renal origin: Secondary | ICD-10-CM | POA: Diagnosis not present

## 2017-06-14 DIAGNOSIS — E1129 Type 2 diabetes mellitus with other diabetic kidney complication: Secondary | ICD-10-CM | POA: Diagnosis not present

## 2017-06-14 DIAGNOSIS — D631 Anemia in chronic kidney disease: Secondary | ICD-10-CM | POA: Diagnosis not present

## 2017-06-16 ENCOUNTER — Other Ambulatory Visit (HOSPITAL_COMMUNITY): Payer: Self-pay | Admitting: Nephrology

## 2017-06-16 DIAGNOSIS — N186 End stage renal disease: Secondary | ICD-10-CM | POA: Diagnosis not present

## 2017-06-16 DIAGNOSIS — N2581 Secondary hyperparathyroidism of renal origin: Secondary | ICD-10-CM | POA: Diagnosis not present

## 2017-06-16 DIAGNOSIS — E1129 Type 2 diabetes mellitus with other diabetic kidney complication: Secondary | ICD-10-CM | POA: Diagnosis not present

## 2017-06-16 DIAGNOSIS — D631 Anemia in chronic kidney disease: Secondary | ICD-10-CM | POA: Diagnosis not present

## 2017-06-17 DIAGNOSIS — Z1231 Encounter for screening mammogram for malignant neoplasm of breast: Secondary | ICD-10-CM | POA: Diagnosis not present

## 2017-06-18 DIAGNOSIS — N2581 Secondary hyperparathyroidism of renal origin: Secondary | ICD-10-CM | POA: Diagnosis not present

## 2017-06-18 DIAGNOSIS — E1129 Type 2 diabetes mellitus with other diabetic kidney complication: Secondary | ICD-10-CM | POA: Diagnosis not present

## 2017-06-18 DIAGNOSIS — D631 Anemia in chronic kidney disease: Secondary | ICD-10-CM | POA: Diagnosis not present

## 2017-06-18 DIAGNOSIS — N186 End stage renal disease: Secondary | ICD-10-CM | POA: Diagnosis not present

## 2017-06-21 DIAGNOSIS — D631 Anemia in chronic kidney disease: Secondary | ICD-10-CM | POA: Diagnosis not present

## 2017-06-21 DIAGNOSIS — R5383 Other fatigue: Secondary | ICD-10-CM | POA: Diagnosis not present

## 2017-06-21 DIAGNOSIS — N2581 Secondary hyperparathyroidism of renal origin: Secondary | ICD-10-CM | POA: Diagnosis not present

## 2017-06-21 DIAGNOSIS — Z992 Dependence on renal dialysis: Secondary | ICD-10-CM | POA: Diagnosis not present

## 2017-06-21 DIAGNOSIS — E1129 Type 2 diabetes mellitus with other diabetic kidney complication: Secondary | ICD-10-CM | POA: Diagnosis not present

## 2017-06-21 DIAGNOSIS — N186 End stage renal disease: Secondary | ICD-10-CM | POA: Diagnosis not present

## 2017-06-21 DIAGNOSIS — I129 Hypertensive chronic kidney disease with stage 1 through stage 4 chronic kidney disease, or unspecified chronic kidney disease: Secondary | ICD-10-CM | POA: Diagnosis not present

## 2017-06-22 ENCOUNTER — Ambulatory Visit (HOSPITAL_COMMUNITY): Payer: Medicare Other

## 2017-06-23 DIAGNOSIS — R5383 Other fatigue: Secondary | ICD-10-CM | POA: Diagnosis not present

## 2017-06-23 DIAGNOSIS — N186 End stage renal disease: Secondary | ICD-10-CM | POA: Diagnosis not present

## 2017-06-23 DIAGNOSIS — E1129 Type 2 diabetes mellitus with other diabetic kidney complication: Secondary | ICD-10-CM | POA: Diagnosis not present

## 2017-06-23 DIAGNOSIS — D631 Anemia in chronic kidney disease: Secondary | ICD-10-CM | POA: Diagnosis not present

## 2017-06-23 DIAGNOSIS — N2581 Secondary hyperparathyroidism of renal origin: Secondary | ICD-10-CM | POA: Diagnosis not present

## 2017-06-24 ENCOUNTER — Other Ambulatory Visit (HOSPITAL_COMMUNITY): Payer: Self-pay | Admitting: Nephrology

## 2017-06-24 ENCOUNTER — Encounter (HOSPITAL_COMMUNITY): Payer: Self-pay | Admitting: Interventional Radiology

## 2017-06-24 ENCOUNTER — Ambulatory Visit (HOSPITAL_COMMUNITY)
Admission: RE | Admit: 2017-06-24 | Discharge: 2017-06-24 | Disposition: A | Discharge status: Home / Self Care | Payer: Medicare Other | Source: Ambulatory Visit | Attending: Nephrology | Admitting: Nephrology

## 2017-06-24 DIAGNOSIS — Y832 Surgical operation with anastomosis, bypass or graft as the cause of abnormal reaction of the patient, or of later complication, without mention of misadventure at the time of the procedure: Secondary | ICD-10-CM | POA: Insufficient documentation

## 2017-06-24 DIAGNOSIS — T82858A Stenosis of vascular prosthetic devices, implants and grafts, initial encounter: Secondary | ICD-10-CM | POA: Diagnosis not present

## 2017-06-24 DIAGNOSIS — N186 End stage renal disease: Secondary | ICD-10-CM

## 2017-06-24 HISTORY — PX: IR AV DIALY SHUNT INTRO NEEDLE/INTRACATH INITIAL W/PTA/IMG LEFT: IMG6103

## 2017-06-24 IMAGING — US US ABDOMEN LIMITED
1 series · 14 of 25 positions shown · non-contrast
Comparison: None.

CLINICAL DATA: Right upper quadrant pain. End-stage renal disease
and on dialysis.

EXAM:
US ABDOMEN LIMITED - RIGHT UPPER QUADRANT

[Series 1: us abdomen limited · 0.20mm/px · 14 of 45 slices shown]
[im 1/45]
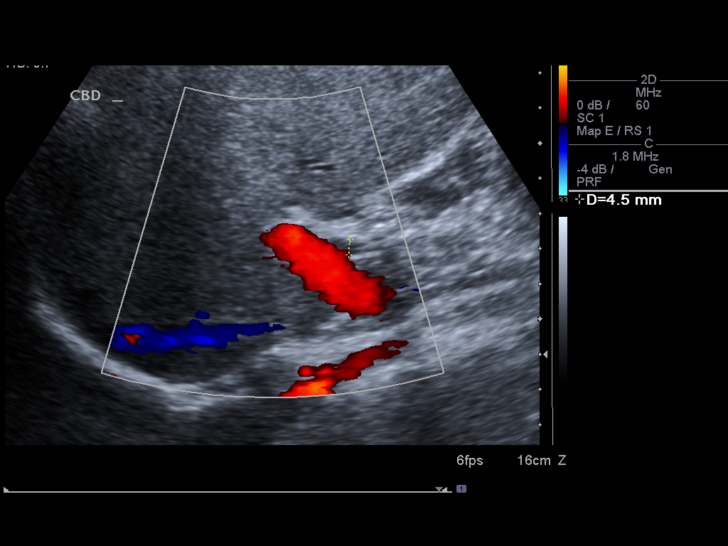
[im 4/45]
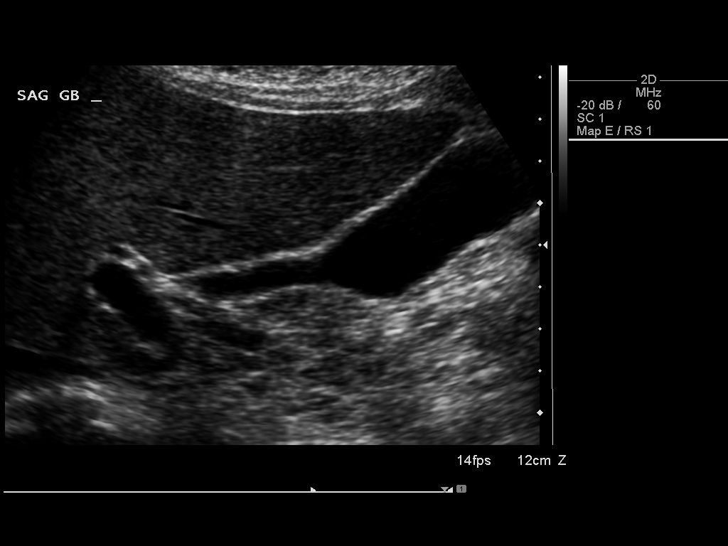
[im 8/45]
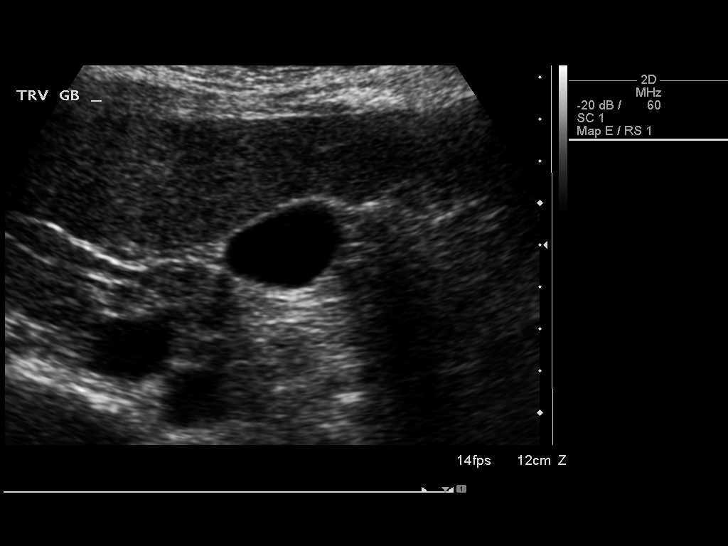
[im 12/45]
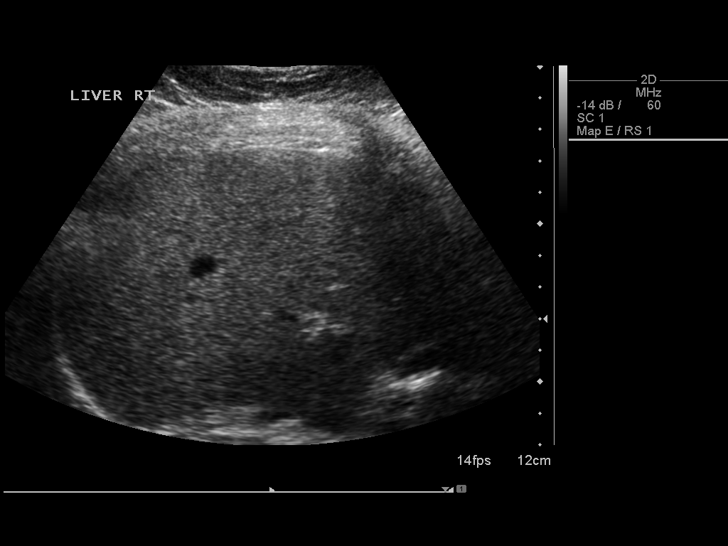
[im 15/45]
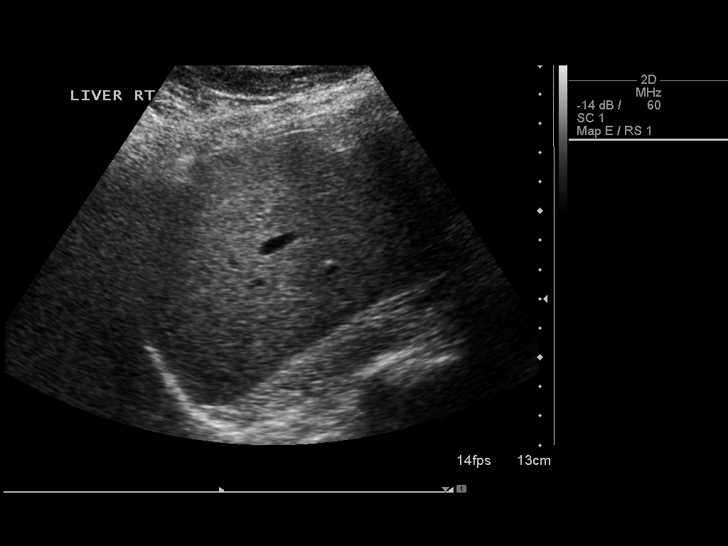
[im 17/45]
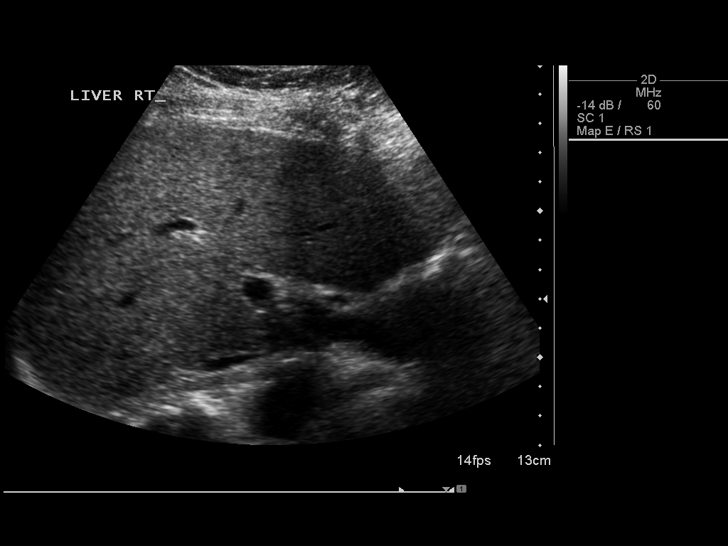
[im 21/45]
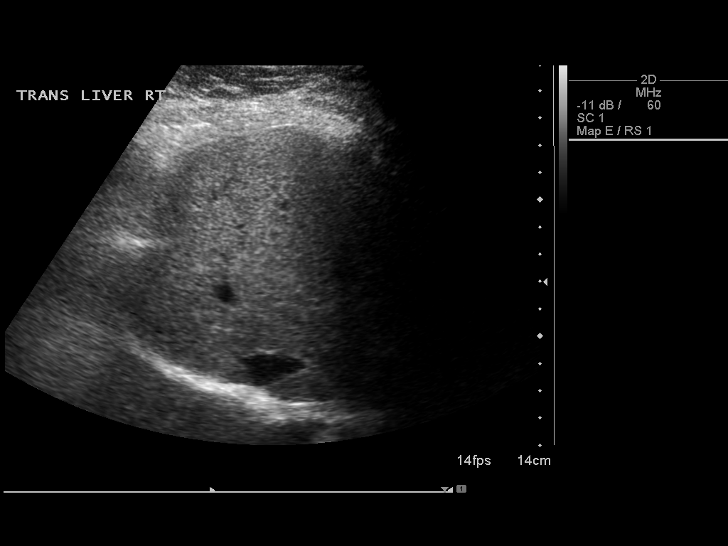
[im 24/45]
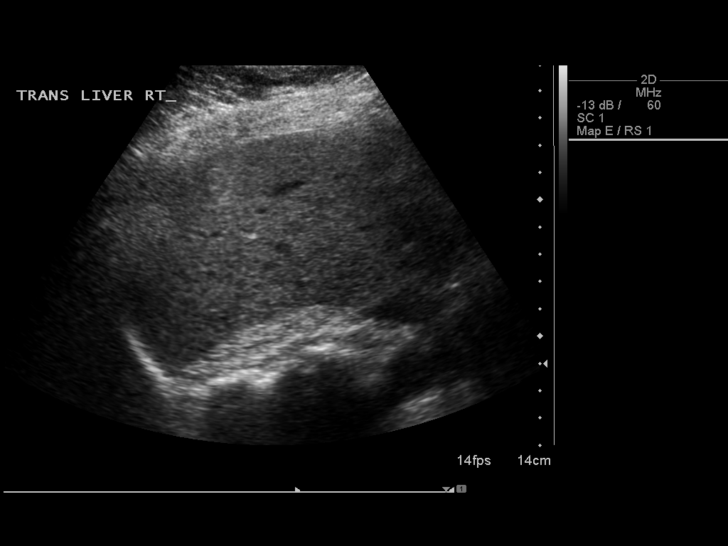
[im 28/45]
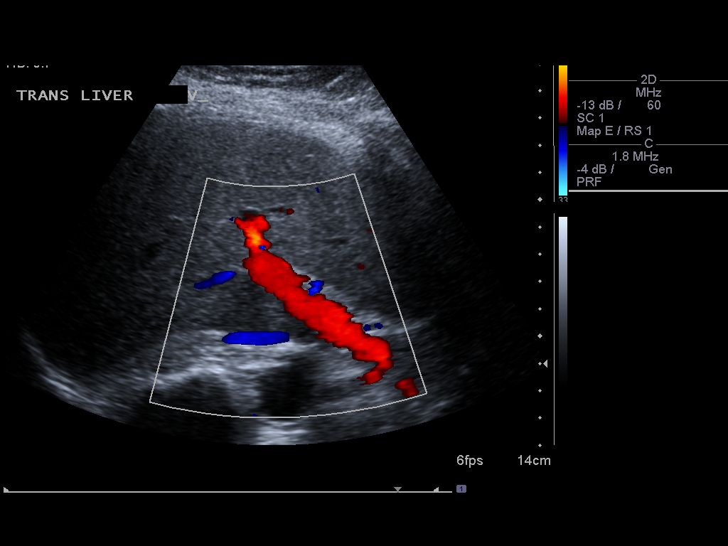
[im 30/45]
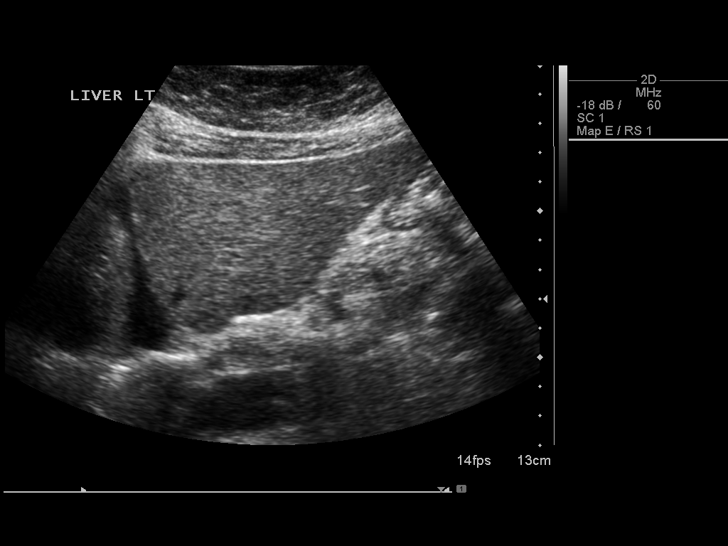
[im 34/45]
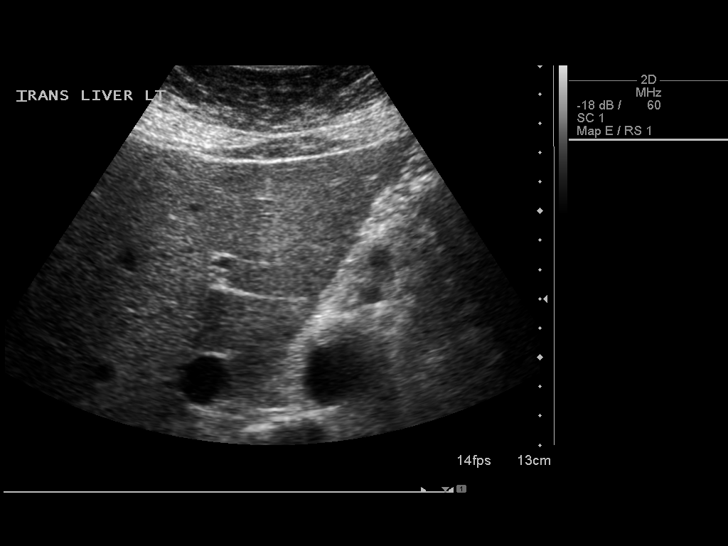
[im 37/45]
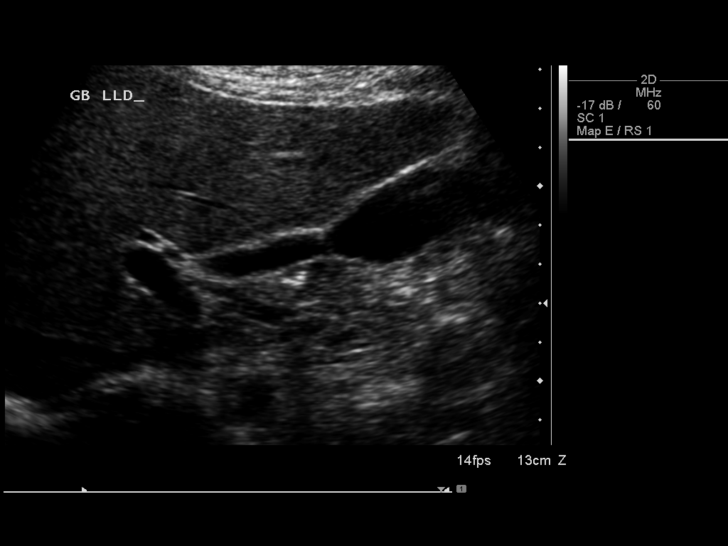
[im 41/45]
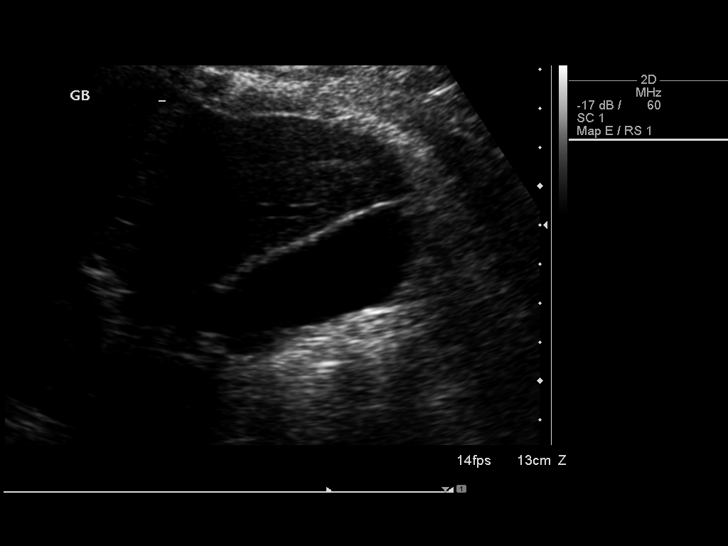
[im 45/45]
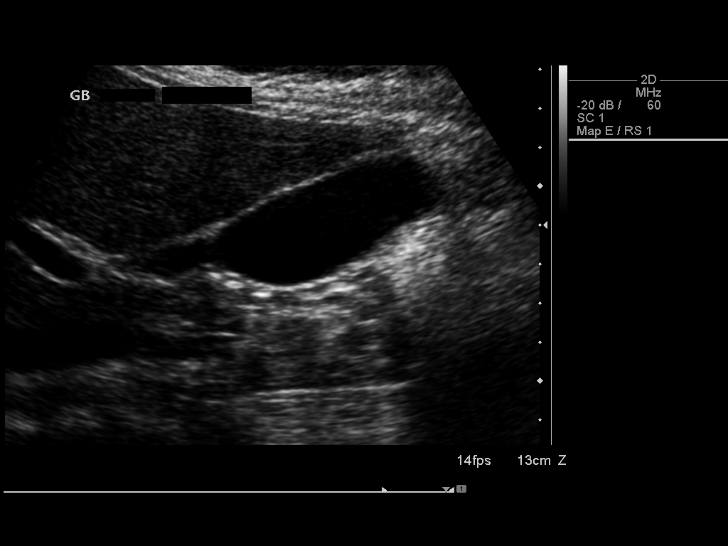

[14 of 25 positions shown; findings below may reference images not displayed]

FINDINGS: Gallbladder:

No gallstones or wall thickening visualized. No sonographic Murphy
sign noted by sonographer.

Common bile duct:

Diameter: 0.5 cm.

Liver:

No focal lesion identified. Within normal limits in parenchymal
echogenicity. Main portal vein is patent.
IMPRESSION: Negative right upper quadrant ultrasound.

## 2017-06-24 MED ORDER — ACETAMINOPHEN 325 MG PO TABS
ORAL_TABLET | ORAL | Status: AC
  Filled 2017-06-24: qty 2

## 2017-06-24 MED ORDER — LIDOCAINE HCL 1 % IJ SOLN
INTRAMUSCULAR | Status: AC
  Filled 2017-06-24: qty 20

## 2017-06-24 MED ORDER — ACETAMINOPHEN 325 MG PO TABS
650.0000 mg | ORAL_TABLET | Freq: Once | ORAL | Status: AC | PRN
  Administered 2017-06-24: 650 mg via ORAL

## 2017-06-24 MED ORDER — IOPAMIDOL (ISOVUE-300) INJECTION 61%
INTRAVENOUS | Status: AC
  Administered 2017-06-24: 40 mL
  Filled 2017-06-24: qty 100

## 2017-06-24 NOTE — Procedures (Signed)
Interventional Radiology Procedure Note  Procedure: Left upper extremity dialysis circuit malfunction.  Balloon angioplasty of outflow stenosis of 62mm and 73mm plasty.  Improved flow.  The recurrent stenosis is located at the axilla.   Complications: None  Recommendations:  - OK to use - 650mg  PO tylenol prn - Given PTA response, deferred stenting of the axillary vein,   - may remove suture at next dialysis. - Routine care   Signed,  Dulcy Fanny. Earleen Newport, DO

## 2017-06-25 DIAGNOSIS — Z4802 Encounter for removal of sutures: Secondary | ICD-10-CM | POA: Insufficient documentation

## 2017-06-25 DIAGNOSIS — E1129 Type 2 diabetes mellitus with other diabetic kidney complication: Secondary | ICD-10-CM | POA: Diagnosis not present

## 2017-06-25 DIAGNOSIS — N2581 Secondary hyperparathyroidism of renal origin: Secondary | ICD-10-CM | POA: Diagnosis not present

## 2017-06-25 DIAGNOSIS — D631 Anemia in chronic kidney disease: Secondary | ICD-10-CM | POA: Diagnosis not present

## 2017-06-25 DIAGNOSIS — N186 End stage renal disease: Secondary | ICD-10-CM | POA: Diagnosis not present

## 2017-06-25 DIAGNOSIS — R5383 Other fatigue: Secondary | ICD-10-CM | POA: Diagnosis not present

## 2017-06-28 DIAGNOSIS — N186 End stage renal disease: Secondary | ICD-10-CM | POA: Diagnosis not present

## 2017-06-28 DIAGNOSIS — E1129 Type 2 diabetes mellitus with other diabetic kidney complication: Secondary | ICD-10-CM | POA: Diagnosis not present

## 2017-06-28 DIAGNOSIS — D631 Anemia in chronic kidney disease: Secondary | ICD-10-CM | POA: Diagnosis not present

## 2017-06-28 DIAGNOSIS — R5383 Other fatigue: Secondary | ICD-10-CM | POA: Diagnosis not present

## 2017-06-28 DIAGNOSIS — N2581 Secondary hyperparathyroidism of renal origin: Secondary | ICD-10-CM | POA: Diagnosis not present

## 2017-06-30 DIAGNOSIS — N2581 Secondary hyperparathyroidism of renal origin: Secondary | ICD-10-CM | POA: Diagnosis not present

## 2017-06-30 DIAGNOSIS — R5383 Other fatigue: Secondary | ICD-10-CM | POA: Diagnosis not present

## 2017-06-30 DIAGNOSIS — E1129 Type 2 diabetes mellitus with other diabetic kidney complication: Secondary | ICD-10-CM | POA: Diagnosis not present

## 2017-06-30 DIAGNOSIS — D631 Anemia in chronic kidney disease: Secondary | ICD-10-CM | POA: Diagnosis not present

## 2017-06-30 DIAGNOSIS — N186 End stage renal disease: Secondary | ICD-10-CM | POA: Diagnosis not present

## 2017-07-02 DIAGNOSIS — D631 Anemia in chronic kidney disease: Secondary | ICD-10-CM | POA: Diagnosis not present

## 2017-07-02 DIAGNOSIS — R5383 Other fatigue: Secondary | ICD-10-CM | POA: Diagnosis not present

## 2017-07-02 DIAGNOSIS — E1129 Type 2 diabetes mellitus with other diabetic kidney complication: Secondary | ICD-10-CM | POA: Diagnosis not present

## 2017-07-02 DIAGNOSIS — N2581 Secondary hyperparathyroidism of renal origin: Secondary | ICD-10-CM | POA: Diagnosis not present

## 2017-07-02 DIAGNOSIS — N186 End stage renal disease: Secondary | ICD-10-CM | POA: Diagnosis not present

## 2017-07-05 DIAGNOSIS — R5383 Other fatigue: Secondary | ICD-10-CM | POA: Diagnosis not present

## 2017-07-05 DIAGNOSIS — E1129 Type 2 diabetes mellitus with other diabetic kidney complication: Secondary | ICD-10-CM | POA: Diagnosis not present

## 2017-07-05 DIAGNOSIS — D631 Anemia in chronic kidney disease: Secondary | ICD-10-CM | POA: Diagnosis not present

## 2017-07-05 DIAGNOSIS — N2581 Secondary hyperparathyroidism of renal origin: Secondary | ICD-10-CM | POA: Diagnosis not present

## 2017-07-05 DIAGNOSIS — N186 End stage renal disease: Secondary | ICD-10-CM | POA: Diagnosis not present

## 2017-07-07 ENCOUNTER — Other Ambulatory Visit: Payer: Self-pay

## 2017-07-07 DIAGNOSIS — T82510A Breakdown (mechanical) of surgically created arteriovenous fistula, initial encounter: Secondary | ICD-10-CM

## 2017-07-07 DIAGNOSIS — Z992 Dependence on renal dialysis: Principal | ICD-10-CM

## 2017-07-07 DIAGNOSIS — N186 End stage renal disease: Secondary | ICD-10-CM

## 2017-07-09 DIAGNOSIS — E1129 Type 2 diabetes mellitus with other diabetic kidney complication: Secondary | ICD-10-CM | POA: Diagnosis not present

## 2017-07-09 DIAGNOSIS — N186 End stage renal disease: Secondary | ICD-10-CM | POA: Diagnosis not present

## 2017-07-09 DIAGNOSIS — R5383 Other fatigue: Secondary | ICD-10-CM | POA: Diagnosis not present

## 2017-07-09 DIAGNOSIS — N2581 Secondary hyperparathyroidism of renal origin: Secondary | ICD-10-CM | POA: Diagnosis not present

## 2017-07-09 DIAGNOSIS — D631 Anemia in chronic kidney disease: Secondary | ICD-10-CM | POA: Diagnosis not present

## 2017-07-12 DIAGNOSIS — R5383 Other fatigue: Secondary | ICD-10-CM | POA: Diagnosis not present

## 2017-07-12 DIAGNOSIS — D631 Anemia in chronic kidney disease: Secondary | ICD-10-CM | POA: Diagnosis not present

## 2017-07-12 DIAGNOSIS — E1129 Type 2 diabetes mellitus with other diabetic kidney complication: Secondary | ICD-10-CM | POA: Diagnosis not present

## 2017-07-12 DIAGNOSIS — N186 End stage renal disease: Secondary | ICD-10-CM | POA: Diagnosis not present

## 2017-07-12 DIAGNOSIS — N2581 Secondary hyperparathyroidism of renal origin: Secondary | ICD-10-CM | POA: Diagnosis not present

## 2017-07-14 DIAGNOSIS — R5383 Other fatigue: Secondary | ICD-10-CM | POA: Diagnosis not present

## 2017-07-14 DIAGNOSIS — D631 Anemia in chronic kidney disease: Secondary | ICD-10-CM | POA: Diagnosis not present

## 2017-07-14 DIAGNOSIS — E1129 Type 2 diabetes mellitus with other diabetic kidney complication: Secondary | ICD-10-CM | POA: Diagnosis not present

## 2017-07-14 DIAGNOSIS — N2581 Secondary hyperparathyroidism of renal origin: Secondary | ICD-10-CM | POA: Diagnosis not present

## 2017-07-14 DIAGNOSIS — N186 End stage renal disease: Secondary | ICD-10-CM | POA: Diagnosis not present

## 2017-07-16 DIAGNOSIS — R5383 Other fatigue: Secondary | ICD-10-CM | POA: Diagnosis not present

## 2017-07-16 DIAGNOSIS — E1129 Type 2 diabetes mellitus with other diabetic kidney complication: Secondary | ICD-10-CM | POA: Diagnosis not present

## 2017-07-16 DIAGNOSIS — N186 End stage renal disease: Secondary | ICD-10-CM | POA: Diagnosis not present

## 2017-07-16 DIAGNOSIS — N2581 Secondary hyperparathyroidism of renal origin: Secondary | ICD-10-CM | POA: Diagnosis not present

## 2017-07-16 DIAGNOSIS — D631 Anemia in chronic kidney disease: Secondary | ICD-10-CM | POA: Diagnosis not present

## 2017-07-19 DIAGNOSIS — N2581 Secondary hyperparathyroidism of renal origin: Secondary | ICD-10-CM | POA: Diagnosis not present

## 2017-07-19 DIAGNOSIS — N186 End stage renal disease: Secondary | ICD-10-CM | POA: Diagnosis not present

## 2017-07-19 DIAGNOSIS — E1129 Type 2 diabetes mellitus with other diabetic kidney complication: Secondary | ICD-10-CM | POA: Diagnosis not present

## 2017-07-19 DIAGNOSIS — R5383 Other fatigue: Secondary | ICD-10-CM | POA: Diagnosis not present

## 2017-07-19 DIAGNOSIS — D631 Anemia in chronic kidney disease: Secondary | ICD-10-CM | POA: Diagnosis not present

## 2017-07-21 DIAGNOSIS — Z992 Dependence on renal dialysis: Secondary | ICD-10-CM | POA: Diagnosis not present

## 2017-07-21 DIAGNOSIS — E1129 Type 2 diabetes mellitus with other diabetic kidney complication: Secondary | ICD-10-CM | POA: Diagnosis not present

## 2017-07-21 DIAGNOSIS — D631 Anemia in chronic kidney disease: Secondary | ICD-10-CM | POA: Diagnosis not present

## 2017-07-21 DIAGNOSIS — N186 End stage renal disease: Secondary | ICD-10-CM | POA: Diagnosis not present

## 2017-07-21 DIAGNOSIS — N2581 Secondary hyperparathyroidism of renal origin: Secondary | ICD-10-CM | POA: Diagnosis not present

## 2017-07-21 DIAGNOSIS — I129 Hypertensive chronic kidney disease with stage 1 through stage 4 chronic kidney disease, or unspecified chronic kidney disease: Secondary | ICD-10-CM | POA: Diagnosis not present

## 2017-07-23 DIAGNOSIS — N2581 Secondary hyperparathyroidism of renal origin: Secondary | ICD-10-CM | POA: Diagnosis not present

## 2017-07-23 DIAGNOSIS — D631 Anemia in chronic kidney disease: Secondary | ICD-10-CM | POA: Diagnosis not present

## 2017-07-23 DIAGNOSIS — N186 End stage renal disease: Secondary | ICD-10-CM | POA: Diagnosis not present

## 2017-07-23 DIAGNOSIS — E1129 Type 2 diabetes mellitus with other diabetic kidney complication: Secondary | ICD-10-CM | POA: Diagnosis not present

## 2017-07-26 DIAGNOSIS — N186 End stage renal disease: Secondary | ICD-10-CM | POA: Diagnosis not present

## 2017-07-26 DIAGNOSIS — N2581 Secondary hyperparathyroidism of renal origin: Secondary | ICD-10-CM | POA: Diagnosis not present

## 2017-07-26 DIAGNOSIS — E1129 Type 2 diabetes mellitus with other diabetic kidney complication: Secondary | ICD-10-CM | POA: Diagnosis not present

## 2017-07-26 DIAGNOSIS — D631 Anemia in chronic kidney disease: Secondary | ICD-10-CM | POA: Diagnosis not present

## 2017-07-28 DIAGNOSIS — N2581 Secondary hyperparathyroidism of renal origin: Secondary | ICD-10-CM | POA: Diagnosis not present

## 2017-07-28 DIAGNOSIS — E1129 Type 2 diabetes mellitus with other diabetic kidney complication: Secondary | ICD-10-CM | POA: Diagnosis not present

## 2017-07-28 DIAGNOSIS — D631 Anemia in chronic kidney disease: Secondary | ICD-10-CM | POA: Diagnosis not present

## 2017-07-28 DIAGNOSIS — N186 End stage renal disease: Secondary | ICD-10-CM | POA: Diagnosis not present

## 2017-07-30 DIAGNOSIS — D631 Anemia in chronic kidney disease: Secondary | ICD-10-CM | POA: Diagnosis not present

## 2017-07-30 DIAGNOSIS — N2581 Secondary hyperparathyroidism of renal origin: Secondary | ICD-10-CM | POA: Diagnosis not present

## 2017-07-30 DIAGNOSIS — E1129 Type 2 diabetes mellitus with other diabetic kidney complication: Secondary | ICD-10-CM | POA: Diagnosis not present

## 2017-07-30 DIAGNOSIS — N186 End stage renal disease: Secondary | ICD-10-CM | POA: Diagnosis not present

## 2017-08-02 DIAGNOSIS — D631 Anemia in chronic kidney disease: Secondary | ICD-10-CM | POA: Diagnosis not present

## 2017-08-02 DIAGNOSIS — N186 End stage renal disease: Secondary | ICD-10-CM | POA: Diagnosis not present

## 2017-08-02 DIAGNOSIS — N2581 Secondary hyperparathyroidism of renal origin: Secondary | ICD-10-CM | POA: Diagnosis not present

## 2017-08-02 DIAGNOSIS — E1129 Type 2 diabetes mellitus with other diabetic kidney complication: Secondary | ICD-10-CM | POA: Diagnosis not present

## 2017-08-04 DIAGNOSIS — E1129 Type 2 diabetes mellitus with other diabetic kidney complication: Secondary | ICD-10-CM | POA: Diagnosis not present

## 2017-08-04 DIAGNOSIS — N186 End stage renal disease: Secondary | ICD-10-CM | POA: Diagnosis not present

## 2017-08-04 DIAGNOSIS — D631 Anemia in chronic kidney disease: Secondary | ICD-10-CM | POA: Diagnosis not present

## 2017-08-04 DIAGNOSIS — N2581 Secondary hyperparathyroidism of renal origin: Secondary | ICD-10-CM | POA: Diagnosis not present

## 2017-08-05 ENCOUNTER — Ambulatory Visit (HOSPITAL_COMMUNITY)
Admission: RE | Admit: 2017-08-05 | Discharge: 2017-08-05 | Disposition: A | Discharge status: Home / Self Care | Payer: Medicare Other | Source: Ambulatory Visit | Attending: Surgery | Admitting: Surgery

## 2017-08-05 DIAGNOSIS — Z992 Dependence on renal dialysis: Secondary | ICD-10-CM

## 2017-08-05 DIAGNOSIS — N186 End stage renal disease: Secondary | ICD-10-CM | POA: Diagnosis not present

## 2017-08-05 DIAGNOSIS — X58XXXA Exposure to other specified factors, initial encounter: Secondary | ICD-10-CM | POA: Diagnosis not present

## 2017-08-05 DIAGNOSIS — T82510A Breakdown (mechanical) of surgically created arteriovenous fistula, initial encounter: Secondary | ICD-10-CM | POA: Insufficient documentation

## 2017-08-06 ENCOUNTER — Ambulatory Visit (INDEPENDENT_AMBULATORY_CARE_PROVIDER_SITE_OTHER): Payer: Medicare Other | Admitting: Surgery

## 2017-08-06 ENCOUNTER — Encounter: Payer: Self-pay | Admitting: Surgery

## 2017-08-06 ENCOUNTER — Other Ambulatory Visit: Payer: Self-pay

## 2017-08-06 VITALS — BP 128/89 | HR 105 | Temp 99.1°F | Resp 14 | Ht 62.0 in | Wt 178.0 lb

## 2017-08-06 DIAGNOSIS — E1129 Type 2 diabetes mellitus with other diabetic kidney complication: Secondary | ICD-10-CM | POA: Diagnosis not present

## 2017-08-06 DIAGNOSIS — Z992 Dependence on renal dialysis: Secondary | ICD-10-CM

## 2017-08-06 DIAGNOSIS — N186 End stage renal disease: Secondary | ICD-10-CM | POA: Diagnosis not present

## 2017-08-06 DIAGNOSIS — D631 Anemia in chronic kidney disease: Secondary | ICD-10-CM | POA: Diagnosis not present

## 2017-08-06 DIAGNOSIS — N2581 Secondary hyperparathyroidism of renal origin: Secondary | ICD-10-CM | POA: Diagnosis not present

## 2017-08-06 NOTE — Progress Notes (Signed)
[facility]  Patient name: Rachael George MRN: [id number] DOB: 01/17/1956 Sex: female   REASON FOR VISIT:    Dialysis access  HISOTRY OF PRESENT ILLNESS:    Rachael George is a 62 y.o. female who is s/p left upper extremity AVGG using a 4x7 tapered graft on 05-25-2017.  She has had multiple interventions to the venous outflow.  Her most recent was by IR in April.  They used a 68mm balloon.  Her flow rates are now down.  She did have a good result from a DCB which was her only intervention prior to April by IR.  I did that in June of 2018 with a 7x40 Lutonix.  She also had a recent infiltration.  This has been getting better and they are still using her graft.   PAST MEDICAL HISTORY:   Past Medical History:  Diagnosis Date  . Anemia   . Asthma   . ESRD (end stage renal disease) ([place])    MWF  . GERD (gastroesophageal reflux disease)   . Gout   . 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 Heavy menstrual bleeding   . High cholesterol   . History of blood transfusion 02/2016- last one   1990's- several ones   . History of kidney stones 1980's  . Hyperlipemia 12/07/2012  . Hypertension   . Hypothyroidism   . Kidney stones   . Pre-diabetes   . Prediabetes   . RA (rheumatoid arthritis) ([place])   . Sleep apnea    [facility]- have CPAP, not able to use every night      FAMILY HISTORY:   Family History  Problem Relation Age of Onset  . Hypertension Mother   . Breast cancer Maternal Aunt   . Deep vein thrombosis Daughter   . Hyperlipidemia Daughter   . Hypertension Daughter   . Prostate cancer Maternal Grandmother     SOCIAL HISTORY:   Social History   Tobacco Use  . Smoking status: Never Smoker  . Smokeless tobacco: Never Used  Substance Use Topics  . Alcohol use: Yes    Alcohol/week: 0.0 oz    Comment: occ - 1 drink rarely     ALLERGIES:   Allergies  Allergen Reactions  .  Atacand [Candesartan] Anaphylaxis    Swelling of the mouth and tongue.  Marland Kitchen Lisinopril Anaphylaxis    Swelling of the mouth and tongue.  . Nsaids Anaphylaxis    Swelling of the mouth and tongue. Swelling of the mouth and tongue.  Marland Kitchen Penicillins Rash    Has patient had a PCN reaction causing immediate rash, facial/tongue/throat swelling, SOB or lightheadedness with hypotension: No Has patient had a PCN reaction causing severe rash involving mucus membranes or skin necrosis: No Has patient had a PCN reaction that required hospitalization No Has patient had a PCN reaction occurring within the last 10 years: No If all of the above answers are "NO", then may proceed with Cephalosporin use.      CURRENT MEDICATIONS:   Current Outpatient Medications  Medication Sig Dispense Refill  . acetaminophen (TYLENOL) 500 MG tablet Take 1,000 mg by mouth every 6 (six) hours as needed for mild pain.     Marland Kitchen albuterol (PROVENTIL) (2.5 MG/3ML) 0.083% nebulizer solution Take 3 mLs (2.5 mg total) by nebulization every 6 (six) hours as needed for wheezing or shortness of breath. ICD 10:J45.20 75 mL 12  . allopurinol (ZYLOPRIM) 100  MG tablet Take 100 mg by mouth 2 (two) times daily. Reported on 09/02/2015    . amLODipine (NORVASC) 10 MG tablet Take 10 mg by mouth at bedtime.     Marland Kitchen b complex-vitamin c-folic acid (NEPHRO-VITE) 0.8 MG TABS tablet Take 1 tablet by mouth every morning.     . carvedilol (COREG) 6.25 MG tablet Take 1 tablet (6.25 mg total) by mouth 2 (two) times daily with a meal. 60 tablet 3  . chlorpheniramine (CHLOR-TRIMETON) 4 MG tablet Take 1 tablet (4 mg total) by mouth 3 (three) times daily. 190 tablet 0  . cinacalcet (SENSIPAR) 60 MG tablet Take 60 mg by mouth every Monday, Wednesday, and Friday at 6 PM.     . clotrimazole (LOTRIMIN) 1 % cream Apply 1 application topically 2 (two) times daily. Apply to inner thighs 60 g 1  . colchicine 0.6 MG tablet Take 2 tabs (1.2mg ) at the onset of a gout flare,  may repeat 1 tab (0.6mg ) if symptoms persist (Patient taking differently: Take 0.6-1.2 mg by mouth 2 (two) times daily as needed (for gout flare up as instructed). Take 2 tabs (1.2mg ) at the onset of a gout flare, may repeat 1 tab (0.6mg ) if symptoms persist) 30 tablet 2  . cyclobenzaprine (FLEXERIL) 10 MG tablet Take 1 tablet (10 mg total) by mouth 2 (two) times daily as needed. For back pain 60 tablet 1  . famotidine (PEPCID AC MAXIMUM STRENGTH) 20 MG tablet Take 20 mg by mouth 2 (two) times daily.    . fluticasone (FLONASE) 50 MCG/ACT nasal spray Place 2 sprays into both nostrils daily. 16 g 2  . gabapentin (NEURONTIN) 300 MG capsule Take 1 capsule (300 mg total) by mouth 2 (two) times daily. 60 capsule 3  . pantoprazole (PROTONIX) 40 MG tablet Take 40 mg by mouth 2 (two) times daily.    . Peak Flow Meter DEVI Use as directed 1 each 0  . sucroferric oxyhydroxide (VELPHORO) 500 MG chewable tablet Chew 1,000-1,500 mg by mouth See admin instructions. Take 3 tablets (15oomg) three times daily with meals, and 2 tablets (1000mg ) with snacks    . triamcinolone cream (KENALOG) 0.1 % Apply 1 application topically 2 (two) times daily. Applied to eczematous lesions 80 g 1  . VENTOLIN HFA 108 (90 Base) MCG/ACT inhaler INHALE 2 PUFFS INTO THE LUNGS EVERY 6 HOURS AS NEEDED FOR WHEEZING ORSHORTNESS OF BREATH 18 g 1  . budesonide-formoterol (SYMBICORT) 160-4.5 MCG/ACT inhaler Inhale 2 puffs into the lungs 2 (two) times daily for 1 day. 1 Inhaler 3  . cetirizine (ZYRTEC) 10 MG tablet TAKE ONE (1) TABLET BY MOUTH EVERY DAY (Patient not taking: Reported on 08/06/2017) 30 tablet 3   No current facility-administered medications for this visit.     REVIEW OF SYSTEMS:   [X]  denotes positive finding, [ ]  denotes negative finding Cardiac  Comments:  Chest pain or chest pressure:    Shortness of breath upon exertion:    Short of breath when lying flat:    Irregular heart rhythm:        Vascular    Pain in calf,  thigh, or hip brought on by ambulation:    Pain in feet at night that wakes you up from your sleep:     Blood clot in your veins:    Leg swelling:         Pulmonary    Oxygen at home:    Productive cough:     Wheezing:  Neurologic    Sudden weakness in arms or legs:     Sudden numbness in arms or legs:     Sudden onset of difficulty speaking or slurred speech:    Temporary loss of vision in one eye:     Problems with dizziness:         Gastrointestinal    Blood in stool:     Vomited blood:         Genitourinary    Burning when urinating:     Blood in urine:        Psychiatric    Major depression:         Hematologic    Bleeding problems:    Problems with blood clotting too easily:        Skin    Rashes or ulcers:        Constitutional    Fever or chills:      PHYSICAL EXAM:   Vitals:   08/06/17 1300  BP: 128/89  Pulse: (!) 105  Resp: 14  Temp: 99.1 F (37.3 C)  TempSrc: Oral  Weight: 178 lb (80.7 kg)  Height: 5\' 2"  (1.575 m)    GENERAL: The patient is a well-nourished female, in no acute distress. The vital signs are documented above. CARDIAC: There is a regular rate and rhythm.  VASCULAR: Palpable thrill within left upper arm graft PULMONARY: Non-labored respirations MUSCULOSKELETAL: There are no major deformities or cyanosis. NEUROLOGIC: No focal weakness or paresthesias are detected. SKIN: There are no ulcers or rashes noted. PSYCHIATRIC: The patient has a normal affect.  STUDIES:   I have reviewed her most recent fistulogram which shows venous anastomotic narrowing which was successfully dilated with a 10 mm balloon.  MEDICAL ISSUES:   Currently, it is unclear if the patient is having difficulty with her flow rates.  The last measurement I see states that they were okay.  I have asked the patient to have her flow rates checked again and if they are down, she will contact the office to be scheduled for a repeat shuntogram.  No  intervention is recommended for her infiltration.    Annamarie Major, MD [facility] [phone] Pager [phone]

## 2017-08-06 NOTE — H&P (View-Only) (Signed)
[facility] [place]  Patient name: Rachael George MRN: [id number] DOB: [birth year] Sex: female   REASON FOR VISIT:    Dialysis access  HISOTRY OF PRESENT ILLNESS:    Rachael George is a 62 y.o. female who is s/p left upper extremity AVGG using a 4x7 tapered graft on 05-25-2017.  She has had multiple interventions to the venous outflow.  Her most recent was by IR in April.  They used a 21mm balloon.  Her flow rates are now down.  She did have a good result from a DCB which was her only intervention prior to April by IR.  I did that in June of 2018 with a 7x40 Lutonix.  She also had a recent infiltration.  This has been getting better and they are still using her graft.   PAST MEDICAL HISTORY:   Past Medical History:  Diagnosis Date  . Anemia   . Asthma   . ESRD (end stage renal disease) ([place])    MWF  . GERD (gastroesophageal reflux disease)   . Gout   . 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 Heavy menstrual bleeding   . High cholesterol   . History of blood transfusion 02/2016- last one   1990's- several ones   . History of kidney stones 1980's  . Hyperlipemia 12/07/2012  . Hypertension   . Hypothyroidism   . Kidney stones   . Pre-diabetes   . Prediabetes   . RA (rheumatoid arthritis) ([place])   . Sleep apnea    [place]- have CPAP, not able to use every night      FAMILY HISTORY:   Family History  Problem Relation Age of Onset  . Hypertension Mother   . Breast cancer Maternal Aunt   . Deep vein thrombosis Daughter   . Hyperlipidemia Daughter   . Hypertension Daughter   . Prostate cancer Maternal Grandmother     SOCIAL HISTORY:   Social History   Tobacco Use  . Smoking status: Never Smoker  . Smokeless tobacco: Never Used  Substance Use Topics  . Alcohol use: Yes    Alcohol/week: 0.0 oz    Comment: occ - 1 drink rarely     ALLERGIES:   Allergies  Allergen Reactions  .  Atacand [Candesartan] Anaphylaxis    Swelling of the mouth and tongue.  Marland Kitchen Lisinopril Anaphylaxis    Swelling of the mouth and tongue.  . Nsaids Anaphylaxis    Swelling of the mouth and tongue. Swelling of the mouth and tongue.  Marland Kitchen Penicillins Rash    Has patient had a PCN reaction causing immediate rash, facial/tongue/throat swelling, SOB or lightheadedness with hypotension: No Has patient had a PCN reaction causing severe rash involving mucus membranes or skin necrosis: No Has patient had a PCN reaction that required hospitalization No Has patient had a PCN reaction occurring within the last 10 years: No If all of the above answers are "NO", then may proceed with Cephalosporin use.      CURRENT MEDICATIONS:   Current Outpatient Medications  Medication Sig Dispense Refill  . acetaminophen (TYLENOL) 500 MG tablet Take 1,000 mg by mouth every 6 (six) hours as needed for mild pain.     Marland Kitchen albuterol (PROVENTIL) (2.5 MG/3ML) 0.083% nebulizer solution Take 3 mLs (2.5 mg total) by nebulization every 6 (six) hours as needed for wheezing or shortness of breath. ICD 10:J45.20 75 mL 12  . allopurinol (ZYLOPRIM) 100  MG tablet Take 100 mg by mouth 2 (two) times daily. Reported on 09/02/2015    . amLODipine (NORVASC) 10 MG tablet Take 10 mg by mouth at bedtime.     Marland Kitchen b complex-vitamin c-folic acid (NEPHRO-VITE) 0.8 MG TABS tablet Take 1 tablet by mouth every morning.     . carvedilol (COREG) 6.25 MG tablet Take 1 tablet (6.25 mg total) by mouth 2 (two) times daily with a meal. 60 tablet 3  . chlorpheniramine (CHLOR-TRIMETON) 4 MG tablet Take 1 tablet (4 mg total) by mouth 3 (three) times daily. 190 tablet 0  . cinacalcet (SENSIPAR) 60 MG tablet Take 60 mg by mouth every Monday, Wednesday, and Friday at 6 PM.     . clotrimazole (LOTRIMIN) 1 % cream Apply 1 application topically 2 (two) times daily. Apply to inner thighs 60 g 1  . colchicine 0.6 MG tablet Take 2 tabs (1.2mg ) at the onset of a gout flare,  may repeat 1 tab (0.6mg ) if symptoms persist (Patient taking differently: Take 0.6-1.2 mg by mouth 2 (two) times daily as needed (for gout flare up as instructed). Take 2 tabs (1.2mg ) at the onset of a gout flare, may repeat 1 tab (0.6mg ) if symptoms persist) 30 tablet 2  . cyclobenzaprine (FLEXERIL) 10 MG tablet Take 1 tablet (10 mg total) by mouth 2 (two) times daily as needed. For back pain 60 tablet 1  . famotidine (PEPCID AC MAXIMUM STRENGTH) 20 MG tablet Take 20 mg by mouth 2 (two) times daily.    . fluticasone (FLONASE) 50 MCG/ACT nasal spray Place 2 sprays into both nostrils daily. 16 g 2  . gabapentin (NEURONTIN) 300 MG capsule Take 1 capsule (300 mg total) by mouth 2 (two) times daily. 60 capsule 3  . pantoprazole (PROTONIX) 40 MG tablet Take 40 mg by mouth 2 (two) times daily.    . Peak Flow Meter DEVI Use as directed 1 each 0  . sucroferric oxyhydroxide (VELPHORO) 500 MG chewable tablet Chew 1,000-1,500 mg by mouth See admin instructions. Take 3 tablets (15oomg) three times daily with meals, and 2 tablets (1000mg ) with snacks    . triamcinolone cream (KENALOG) 0.1 % Apply 1 application topically 2 (two) times daily. Applied to eczematous lesions 80 g 1  . VENTOLIN HFA 108 (90 Base) MCG/ACT inhaler INHALE 2 PUFFS INTO THE LUNGS EVERY 6 HOURS AS NEEDED FOR WHEEZING ORSHORTNESS OF BREATH 18 g 1  . budesonide-formoterol (SYMBICORT) 160-4.5 MCG/ACT inhaler Inhale 2 puffs into the lungs 2 (two) times daily for 1 day. 1 Inhaler 3  . cetirizine (ZYRTEC) 10 MG tablet TAKE ONE (1) TABLET BY MOUTH EVERY DAY (Patient not taking: Reported on 08/06/2017) 30 tablet 3   No current facility-administered medications for this visit.     REVIEW OF SYSTEMS:   [X]  denotes positive finding, [ ]  denotes negative finding Cardiac  Comments:  Chest pain or chest pressure:    Shortness of breath upon exertion:    Short of breath when lying flat:    Irregular heart rhythm:        Vascular    Pain in calf,  thigh, or hip brought on by ambulation:    Pain in feet at night that wakes you up from your sleep:     Blood clot in your veins:    Leg swelling:         Pulmonary    Oxygen at home:    Productive cough:     Wheezing:  Neurologic    Sudden weakness in arms or legs:     Sudden numbness in arms or legs:     Sudden onset of difficulty speaking or slurred speech:    Temporary loss of vision in one eye:     Problems with dizziness:         Gastrointestinal    Blood in stool:     Vomited blood:         Genitourinary    Burning when urinating:     Blood in urine:        Psychiatric    Major depression:         Hematologic    Bleeding problems:    Problems with blood clotting too easily:        Skin    Rashes or ulcers:        Constitutional    Fever or chills:      PHYSICAL EXAM:   Vitals:   08/06/17 1300  BP: 128/89  Pulse: (!) 105  Resp: 14  Temp: 99.1 F (37.3 C)  TempSrc: Oral  Weight: 178 lb (80.7 kg)  Height: 5\' 2"  (1.575 m)    GENERAL: The patient is a well-nourished female, in no acute distress. The vital signs are documented above. CARDIAC: There is a regular rate and rhythm.  VASCULAR: Palpable thrill within left upper arm graft PULMONARY: Non-labored respirations MUSCULOSKELETAL: There are no major deformities or cyanosis. NEUROLOGIC: No focal weakness or paresthesias are detected. SKIN: There are no ulcers or rashes noted. PSYCHIATRIC: The patient has a normal affect.  STUDIES:   I have reviewed her most recent fistulogram which shows venous anastomotic narrowing which was successfully dilated with a 10 mm balloon.  MEDICAL ISSUES:   Currently, it is unclear if the patient is having difficulty with her flow rates.  The last measurement I see states that they were okay.  I have asked the patient to have her flow rates checked again and if they are down, she will contact the office to be scheduled for a repeat shuntogram.  No  intervention is recommended for her infiltration.    Annamarie Major, MD [facility] [phone] Pager [phone]

## 2017-08-09 ENCOUNTER — Encounter: Payer: Self-pay | Admitting: Nephrology

## 2017-08-09 ENCOUNTER — Encounter: Payer: Self-pay | Admitting: *Deleted

## 2017-08-09 DIAGNOSIS — N186 End stage renal disease: Secondary | ICD-10-CM | POA: Diagnosis not present

## 2017-08-09 DIAGNOSIS — D631 Anemia in chronic kidney disease: Secondary | ICD-10-CM | POA: Diagnosis not present

## 2017-08-09 DIAGNOSIS — E1129 Type 2 diabetes mellitus with other diabetic kidney complication: Secondary | ICD-10-CM | POA: Diagnosis not present

## 2017-08-09 DIAGNOSIS — N2581 Secondary hyperparathyroidism of renal origin: Secondary | ICD-10-CM | POA: Diagnosis not present

## 2017-08-09 NOTE — Progress Notes (Signed)
Flow rate checked at [facility] by Gregery Na. Flow rate 1286.

## 2017-08-11 ENCOUNTER — Other Ambulatory Visit: Payer: Self-pay | Admitting: *Deleted

## 2017-08-11 ENCOUNTER — Encounter: Payer: Self-pay | Admitting: *Deleted

## 2017-08-11 DIAGNOSIS — E1129 Type 2 diabetes mellitus with other diabetic kidney complication: Secondary | ICD-10-CM | POA: Diagnosis not present

## 2017-08-11 DIAGNOSIS — N2581 Secondary hyperparathyroidism of renal origin: Secondary | ICD-10-CM | POA: Diagnosis not present

## 2017-08-11 DIAGNOSIS — D631 Anemia in chronic kidney disease: Secondary | ICD-10-CM | POA: Diagnosis not present

## 2017-08-11 DIAGNOSIS — N186 End stage renal disease: Secondary | ICD-10-CM | POA: Diagnosis not present

## 2017-08-11 NOTE — Progress Notes (Unsigned)
Access flow reported as [id number] from [place] at [facility].

## 2017-08-11 NOTE — Progress Notes (Signed)
Instructions given to patient on the phone and letter of instructions faxed to Raquel Sarna (confirmed reced) at [facility]. See letter.

## 2017-08-13 DIAGNOSIS — D631 Anemia in chronic kidney disease: Secondary | ICD-10-CM | POA: Diagnosis not present

## 2017-08-13 DIAGNOSIS — N2581 Secondary hyperparathyroidism of renal origin: Secondary | ICD-10-CM | POA: Diagnosis not present

## 2017-08-13 DIAGNOSIS — N186 End stage renal disease: Secondary | ICD-10-CM | POA: Diagnosis not present

## 2017-08-13 DIAGNOSIS — E1129 Type 2 diabetes mellitus with other diabetic kidney complication: Secondary | ICD-10-CM | POA: Diagnosis not present

## 2017-08-14 ENCOUNTER — Emergency Department (HOSPITAL_COMMUNITY)
Admission: EM | Admit: 2017-08-14 | Discharge: 2017-08-14 | Disposition: A | Discharge status: Home / Self Care | Payer: Medicare Other | Attending: Emergency Medicine | Admitting: Emergency Medicine

## 2017-08-14 ENCOUNTER — Emergency Department (HOSPITAL_COMMUNITY): Payer: Medicare Other

## 2017-08-14 ENCOUNTER — Other Ambulatory Visit: Payer: Self-pay

## 2017-08-14 ENCOUNTER — Encounter (HOSPITAL_COMMUNITY): Payer: Self-pay | Admitting: Emergency Medicine

## 2017-08-14 DIAGNOSIS — Z992 Dependence on renal dialysis: Secondary | ICD-10-CM | POA: Diagnosis not present

## 2017-08-14 DIAGNOSIS — R112 Nausea with vomiting, unspecified: Secondary | ICD-10-CM | POA: Insufficient documentation

## 2017-08-14 DIAGNOSIS — R42 Dizziness and giddiness: Secondary | ICD-10-CM | POA: Diagnosis not present

## 2017-08-14 DIAGNOSIS — Z79899 Other long term (current) drug therapy: Secondary | ICD-10-CM | POA: Diagnosis not present

## 2017-08-14 DIAGNOSIS — N186 End stage renal disease: Secondary | ICD-10-CM | POA: Insufficient documentation

## 2017-08-14 DIAGNOSIS — E039 Hypothyroidism, unspecified: Secondary | ICD-10-CM | POA: Diagnosis not present

## 2017-08-14 DIAGNOSIS — I120 Hypertensive chronic kidney disease with stage 5 chronic kidney disease or end stage renal disease: Secondary | ICD-10-CM | POA: Insufficient documentation

## 2017-08-14 DIAGNOSIS — R404 Transient alteration of awareness: Secondary | ICD-10-CM | POA: Diagnosis not present

## 2017-08-14 DIAGNOSIS — R270 Ataxia, unspecified: Secondary | ICD-10-CM | POA: Diagnosis not present

## 2017-08-14 DIAGNOSIS — J4520 Mild intermittent asthma, uncomplicated: Secondary | ICD-10-CM | POA: Insufficient documentation

## 2017-08-14 DIAGNOSIS — I10 Essential (primary) hypertension: Secondary | ICD-10-CM | POA: Diagnosis not present

## 2017-08-14 LAB — BASIC METABOLIC PANEL
Anion gap: 16 — ABNORMAL HIGH (ref 5–15)
BUN: 39 mg/dL — ABNORMAL HIGH (ref 6–20)
CO2: 28 mmol/L (ref 22–32)
Calcium: 9.4 mg/dL (ref 8.9–10.3)
Chloride: 95 mmol/L — ABNORMAL LOW (ref 101–111)
Creatinine, Ser: 6.79 mg/dL — ABNORMAL HIGH (ref 0.44–1.00)
GFR calc Af Amer: 7 mL/min — ABNORMAL LOW (ref >60)
GFR calc non Af Amer: 6 mL/min — ABNORMAL LOW (ref >60)
Glucose, Bld: 103 mg/dL — ABNORMAL HIGH (ref 65–99)
Potassium: 4.2 mmol/L (ref 3.5–5.1)
Sodium: 139 mmol/L (ref 135–145)

## 2017-08-14 LAB — CBC
HCT: 36.3 % (ref 36.0–46.0)
Hemoglobin: 11.5 g/dL — ABNORMAL LOW (ref 12.0–15.0)
MCH: 25.3 pg — ABNORMAL LOW (ref 26.0–34.0)
MCHC: 31.7 g/dL (ref 30.0–36.0)
MCV: 79.8 fL (ref 78.0–100.0)
Platelets: 139 10*3/uL — ABNORMAL LOW (ref 150–400)
RBC: 4.55 MIL/uL (ref 3.87–5.11)
RDW: 18.4 % — ABNORMAL HIGH (ref 11.5–15.5)
WBC: 8.8 10*3/uL (ref 4.0–10.5)

## 2017-08-14 MED ORDER — MECLIZINE HCL 25 MG PO TABS
25.0000 mg | ORAL_TABLET | Freq: Once | ORAL | Status: AC
  Administered 2017-08-14: 25 mg via ORAL
  Filled 2017-08-14: qty 1

## 2017-08-14 MED ORDER — ONDANSETRON 4 MG PO TBDP
4.0000 mg | ORAL_TABLET | Freq: Once | ORAL | Status: AC
  Administered 2017-08-14: 4 mg via ORAL
  Filled 2017-08-14: qty 1

## 2017-08-14 MED ORDER — DIAZEPAM 5 MG PO TABS
5.0000 mg | ORAL_TABLET | Freq: Once | ORAL | Status: AC
  Administered 2017-08-14: 5 mg via ORAL
  Filled 2017-08-14: qty 1

## 2017-08-14 NOTE — ED Provider Notes (Signed)
[facility] EMERGENCY DEPARTMENT Provider Note  CSN: [id number] Arrival date & time: 08/14/17 0998  Chief Complaint(s) Dizziness  HPI Rachael George is a 62 y.o. female   The history is provided by the patient.  Dizziness  Quality:  Room spinning Severity:  Moderate Onset quality:  Gradual Duration:  3 hours Timing:  Constant Progression:  Waxing and waning Chronicity:  Recurrent Context: head movement   Relieved by:  Being still Worsened by:  Lying down, movement, turning head and standing up Associated symptoms: nausea and vomiting   Associated symptoms: no blood in stool, no chest pain, no diarrhea, no headaches, no shortness of breath, no vision changes and no weakness   Risk factors: hx of vertigo     Past Medical History Past Medical History:  Diagnosis Date  . Anemia   . Asthma   . ESRD (end stage renal disease) ([place])    MWF  . GERD (gastroesophageal reflux disease)   . Gout   . 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 Heavy menstrual bleeding   . High cholesterol   . History of blood transfusion 02/2016- last one   1990's- several ones   . History of kidney stones 1980's  . Hyperlipemia 12/07/2012  . Hypertension   . Hypothyroidism   . Kidney stones   . Pre-diabetes   . Prediabetes   . RA (rheumatoid arthritis) ([place])   . Sleep apnea    [place]- have CPAP, not able to use every night    Patient Active Problem List   Diagnosis Date Noted  . Cough 05/20/2017  . Periodic limb movements of sleep 03/17/2017  . Eczema 09/17/2016  . Acute blood loss anemia 03/03/2016  . Hypovolemic shock ([place]) 03/03/2016  . Staphylococcus aureus bacteremia 03/03/2016  . 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 Hematoma 02/07/2015  . Chronic gout 10/05/2014  . Anemia in chronic kidney disease 12/27/2013  . Essential hypertension, benign 12/07/2012  . Prediabetes 12/07/2012  . End stage renal disease ([place]) 12/07/2012   . Low back pain 12/07/2012  . Right leg numbness 12/07/2012  . Asthma, mild intermittent 12/07/2012  . Hyperlipemia 12/07/2012  . Obesity, unspecified 12/07/2012   Home Medication(s) Prior to Admission medications   Medication Sig Start Date End Date Taking? Authorizing Provider  acetaminophen (TYLENOL) 500 MG tablet Take 1,000 mg by mouth every 6 (six) hours as needed for mild pain.     [provider]  albuterol (PROVENTIL) (2.5 MG/3ML) 0.083% nebulizer solution Take 3 mLs (2.5 mg total) by nebulization every 6 (six) hours as needed for wheezing or shortness of breath. ICD 10:J45.20 04/01/17   Charlott Rakes, MD  allopurinol (ZYLOPRIM) 100 MG tablet Take 100 mg by mouth 2 (two) times daily. Reported on 09/02/2015    [provider]  amLODipine (NORVASC) 10 MG tablet Take 10 mg by mouth at bedtime.     [provider]  b complex-vitamin c-folic acid (NEPHRO-VITE) 0.8 MG TABS tablet Take 1 tablet by mouth every morning.     [provider]  budesonide-formoterol (SYMBICORT) 160-4.5 MCG/ACT inhaler Inhale 2 puffs into the lungs 2 (two) times daily for 1 day. 03/22/17 03/23/17  Marshell Garfinkel, MD  carvedilol (COREG) 6.25 MG tablet Take 1 tablet (6.25 mg total) by mouth 2 (two) times daily with a meal. 03/30/17   Charlott Rakes, MD  cetirizine (ZYRTEC) 10 MG tablet TAKE ONE (1) TABLET BY MOUTH EVERY  DAY Patient not taking: Reported on 08/06/2017 03/30/17   Charlott Rakes, MD  chlorpheniramine (CHLOR-TRIMETON) 4 MG tablet Take 1 tablet (4 mg total) by mouth 3 (three) times daily. 05/20/17   Marshell Garfinkel, MD  cinacalcet (SENSIPAR) 60 MG tablet Take 60 mg by mouth every Monday, Wednesday, and Friday at 6 PM.     [provider]  clotrimazole (LOTRIMIN) 1 % cream Apply 1 application topically 2 (two) times daily. Apply to inner thighs 09/17/16   Charlott Rakes, MD  colchicine 0.6 MG tablet Take 2 tabs (1.2mg ) at the onset of a gout flare, may repeat 1 tab  (0.6mg ) if symptoms persist Patient taking differently: Take 0.6-1.2 mg by mouth 2 (two) times daily as needed (for gout flare up as instructed). Take 2 tabs (1.2mg ) at the onset of a gout flare, may repeat 1 tab (0.6mg ) if symptoms persist 09/02/15   Charlott Rakes, MD  cyclobenzaprine (FLEXERIL) 10 MG tablet Take 1 tablet (10 mg total) by mouth 2 (two) times daily as needed. For back pain 12/01/16   Charlott Rakes, MD  famotidine (PEPCID AC MAXIMUM STRENGTH) 20 MG tablet Take 20 mg by mouth 2 (two) times daily.    [provider]  fluticasone (FLONASE) 50 MCG/ACT nasal spray Place 2 sprays into both nostrils daily. 05/20/17   Mannam, Hart Robinsons, MD  gabapentin (NEURONTIN) 300 MG capsule Take 1 capsule (300 mg total) by mouth 2 (two) times daily. 03/30/17   Charlott Rakes, MD  pantoprazole (PROTONIX) 40 MG tablet Take 40 mg by mouth 2 (two) times daily.    [provider]  Peak Flow Meter DEVI Use as directed 05/20/17   Marshell Garfinkel, MD  sucroferric oxyhydroxide (VELPHORO) 500 MG chewable tablet Chew 1,000-1,500 mg by mouth See admin instructions. Take 3 tablets (15oomg) three times daily with meals, and 2 tablets (1000mg ) with snacks    [provider]  triamcinolone cream (KENALOG) 0.1 % Apply 1 application topically 2 (two) times daily. Applied to eczematous lesions 09/17/16   Charlott Rakes, MD  VENTOLIN HFA 108 (90 Base) MCG/ACT inhaler INHALE 2 PUFFS INTO THE LUNGS EVERY 6 HOURS AS NEEDED FOR WHEEZING ORSHORTNESS OF BREATH 03/26/17   Charlott Rakes, MD                                                                                                                                    Past Surgical History Past Surgical History:  Procedure Laterality Date  . A/V SHUNTOGRAM N/A 09/08/2016   Procedure: A/V Shuntogram - Left Arm AV Graft;  Surgeon: Serafina Mitchell, MD;  Location: [place] [facility];  Service: Cardiovascular;  Laterality: N/A;  . ABDOMINAL HYSTERECTOMY  2000    . AV FISTULA PLACEMENT Left 05/25/2016   Procedure: INSERTION OF LEFT UPPER ARM ARTERIOVENOUS (AV) LOOP GORE-TEX GRAFT ARM;  Surgeon: Elam Dutch, MD;  Location: [place];  Service: Vascular;  Laterality: Left;  .  BASCILIC VEIN TRANSPOSITION Right 12/06/2014   Procedure: FIRST STAGE BASILIC VEIN TRANSPOSITION - RIGHT;  Surgeon: Serafina Mitchell, MD;  Location: [place];  Service: Vascular;  Laterality: Right;  . BASCILIC VEIN TRANSPOSITION Right 02/07/2015   Procedure: RIGHT ARM SECOND STAGE BASILIC VEIN TRANSPOSITION;  Surgeon: Serafina Mitchell, MD;  Location: [place];  Service: Vascular;  Laterality: Right;  . BASCILIC VEIN TRANSPOSITION Left 04/22/2016   Procedure: FIRST STAGE BASILIC VEIN TRANSPOSITION LEFT ARM;  Surgeon: Conrad Westchester, MD;  Location: [place];  Service: Vascular;  Laterality: Left;  . COLONOSCOPY W/ POLYPECTOMY    . ECTOPIC PREGNANCY SURGERY  02/1982  . EXCHANGE OF A DIALYSIS CATHETER Right 04/22/2016   Procedure: EXCHANGE OF A DIALYSIS CATHETER - INSERTION RIGHT INTERNAL JUGULAR & REMOVAL FROM LEFT INTERNAL JUGULAR;  Surgeon: Conrad La Cienega, MD;  Location: [place];  Service: Vascular;  Laterality: Right;  . I&D EXTREMITY Right 02/07/2015   Procedure: IRRIGATION AND DEBRIDEMENT RIGHT ARM HEMATOMA;  Surgeon: Serafina Mitchell, MD;  Location: [place];  Service: Vascular;  Laterality: Right;  . INSERTION OF DIALYSIS CATHETER  03/03/2016   Procedure: INSERTION OF DIALYSIS CATHETER;  Surgeon: Elam Dutch, MD;  Location: [place];  Service: Vascular;;  . IR AV DIALY SHUNT INTRO NEEDLE/INTRACATH INITIAL W/PTA/IMG LEFT  04/20/2017  . IR AV DIALY SHUNT INTRO NEEDLE/INTRACATH INITIAL W/PTA/IMG LEFT  06/24/2017  . IR THROMBECTOMY AV FISTULA W/THROMBOLYSIS/PTA INC/SHUNT/IMG LEFT Left 01/04/2017  . IR US GUIDE VASC ACCESS LEFT  01/04/2017  . left shoulder surgery  08/2005  . LIGATION OF ARTERIOVENOUS  FISTULA  03/03/2016   Procedure: LIGATION OF ARTERIOVENOUS  FISTULA;  Surgeon: Elam Dutch, MD;   Location: [place];  Service: Vascular;;  . PERIPHERAL VASCULAR BALLOON ANGIOPLASTY  09/08/2016   Procedure: Peripheral Vascular Balloon Angioplasty;  Surgeon: Serafina Mitchell, MD;  Location: [place] [facility];  Service: Cardiovascular;;  left avf  . TEE WITHOUT CARDIOVERSION N/A 03/06/2016   Procedure: TRANSESOPHAGEAL ECHOCARDIOGRAM (TEE);  Surgeon: Sueanne Margarita, MD;  Location: [facility];  Service: Cardiovascular;  Laterality: N/A;  . TUBAL LIGATION  11/1986   Family History Family History  Problem Relation Age of Onset  . Hypertension Mother   . Breast cancer Maternal Aunt   . Deep vein thrombosis Daughter   . Hyperlipidemia Daughter   . Hypertension Daughter   . Prostate cancer Maternal Grandmother     Social History Social History   Tobacco Use  . Smoking status: Never Smoker  . Smokeless tobacco: Never Used  Substance Use Topics  . Alcohol use: Yes    Alcohol/week: 0.0 oz    Comment: occ - 1 drink rarely  . Drug use: No   Allergies Atacand [candesartan]; Lisinopril; Nsaids; and Penicillins  Review of Systems Review of Systems  Respiratory: Negative for shortness of breath.   Cardiovascular: Negative for chest pain.  Gastrointestinal: Positive for nausea and vomiting. Negative for blood in stool and diarrhea.  Neurological: Positive for dizziness. Negative for weakness and headaches.   All other systems are reviewed and are negative for acute change except as noted in the HPI  Physical Exam Vital Signs  I have reviewed the triage vital signs BP (!) 151/89 (BP Location: Right Arm)   Pulse 86   Temp 97.8 F (36.6 C) (Oral)   Resp 18   Ht 5\' 2"  (1.575 m)   Wt 81.6 kg (180 lb)   SpO2 98%   BMI 32.92 kg/m  Physical Exam  Constitutional: She is oriented to person, place, and time. She appears well-developed and well-nourished. No distress.  HENT:  Head: Normocephalic and atraumatic.  Nose: Nose normal.  Eyes: Pupils are equal, round, and reactive to  light. Conjunctivae and EOM are normal. Right eye exhibits no discharge. Left eye exhibits no discharge. No scleral icterus.  Neck: Normal range of motion. Neck supple.  Cardiovascular: Normal rate and regular rhythm. Exam reveals no gallop and no friction rub.  No murmur heard. Pulmonary/Chest: Effort normal and breath sounds normal. No stridor. No respiratory distress. She has no rales.  Abdominal: Soft. She exhibits no distension. There is no tenderness.  Musculoskeletal: She exhibits no edema or tenderness.  Neurological: She is alert and oriented to person, place, and time.  Mental Status:  Alert and oriented to person, place, and time.  Attention and concentration normal.  Speech clear.  Recent memory is intact  Cranial Nerves:  II Visual Fields: Intact to confrontation. Visual fields intact. III, IV, VI: Pupils equal and reactive to light and near. Full eye movement without nystagmus  V Facial Sensation: Normal. No weakness of masticatory muscles  VII: No facial weakness or asymmetry  VIII Auditory Acuity: Grossly normal  IX/X: The uvula is midline; the palate elevates symmetrically  XI: Normal sternocleidomastoid and trapezius strength  XII: The tongue is midline. No atrophy or fasciculations.   Motor System: Muscle Strength: 5/5 and symmetric in the upper and lower extremities. No pronation or drift.  Muscle Tone: Tone and muscle bulk are normal in the upper and lower extremities.   Reflexes: DTRs: 1+ and symmetrical in all four extremities. No Clonus Coordination: Intact finger-to-nose, heel-to-shin. No tremor.  Sensation: Intact to light touch, and pinprick.  Gait: ataxic, wide gait   HINTS Plus: Nystagmus: no  Head impulse: normal Skew: normal Hearing: intact    Skin: Skin is warm and dry. No rash noted. She is not diaphoretic. No erythema.  Psychiatric: She has a normal mood and affect.  Vitals reviewed.   ED Results and Treatments Labs (all labs ordered are  listed, but only abnormal results are displayed) Labs Reviewed  BASIC METABOLIC PANEL - Abnormal; Notable for the following components:      Result Value   Chloride 95 (*)    Glucose, Bld 103 (*)    BUN 39 (*)    Creatinine, Ser 6.79 (*)    GFR calc non Af Amer 6 (*)    GFR calc Af Amer 7 (*)    Anion gap 16 (*)    All other components within normal limits  CBC - Abnormal; Notable for the following components:   Hemoglobin 11.5 (*)    MCH 25.3 (*)    RDW 18.4 (*)    Platelets 139 (*)    All other components within normal limits  CBG MONITORING, ED  EKG  EKG Interpretation  Date/Time:  Saturday Aug 14 2017 06:47:37 EDT Ventricular Rate:  85 PR Interval:    QRS Duration: 70 QT Interval:  376 QTC Calculation: 448 R Axis:   14 Text Interpretation:  Sinus rhythm Probable left atrial enlargement Borderline T wave abnormalities no significant change from previous Confirmed by Addison Lank [phone]) on 08/14/2017 6:54:06 AM Also confirmed by Addison Lank [id number]), editor Laurena Spies [phone])  on 08/14/2017 8:28:29 AM      Radiology Mr Brain Wo Contrast  Result Date: 08/14/2017 CLINICAL DATA:  Ataxia, stroke suspected. Dizziness, nausea and vomiting, and bleeding in the ear after removing headphones. Dialysis patient. EXAM: MRI HEAD WITHOUT CONTRAST TECHNIQUE: Multiplanar, multiecho pulse sequences of the brain and surrounding structures were obtained without intravenous contrast. COMPARISON:  None. FINDINGS: Brain: No evidence for acute infarction, hemorrhage, mass lesion, hydrocephalus, or extra-axial fluid. Normal cerebral volume. No significant white matter disease. Prominent perivascular spaces. Vascular: Flow voids are maintained in the carotid, basilar, and RIGHT vertebral arteries. LEFT vertebral is diminutive, likely ending in PICA. Skull and upper cervical  spine: Normal marrow signal. Sinuses/Orbits: Negative orbits and sinuses. Other: The middle ear and mastoid regions are clear. Scalp soft tissues unremarkable. IMPRESSION: Unremarkable MRI brain.  No acute stroke is evident. Electronically Signed   By: Staci Righter M.D.   On: 08/14/2017 12:27   Pertinent labs & imaging results that were available during my care of the patient were reviewed by me and considered in my medical decision making (see chart for details).  Medications Ordered in ED Medications  meclizine (ANTIVERT) tablet 25 mg (25 mg Oral Given 08/14/17 0739)  diazepam (VALIUM) tablet 5 mg (5 mg Oral Given 08/14/17 0928)  ondansetron (ZOFRAN-ODT) disintegrating tablet 4 mg (4 mg Oral Given 08/14/17 5701)                                                                                                                                    Procedures Procedures  (including critical care time)  Medical Decision Making / ED Course I have reviewed the nursing notes for this encounter and the patient's prior records (if available in EHR or on provided paperwork).    Sudden onset of vertiginous symptoms upon waking in this morning last known normal more than 8 hours prior to arrival.  Symptoms are positional in nature and are more suspicious for peripheral etiology however hints exam not reassuring.  After a dose of Antivert her symptoms improved mildly however she is still very ataxic.  Given her increased risk for CVA due to her comorbidities, MRI will be obtained to rule out cerebellar CVA.   Labs otherwise grossly reassuring without significant anemia, or electrolyte derangements.  Renal function consistent with ESRD.  MRI negative for CVA.  Likely peripheral versus dialysis disequilibrium.  On reassessment, with significant improvement.  Final Clinical Impression(s) / ED Diagnoses Final diagnoses:  Vertigo  Disposition: Discharge  Condition: Good  I have discussed the results, Dx  and Tx plan with the patient who expressed understanding and agree(s) with the plan. Discharge instructions discussed at great length. The patient was given strict return precautions who verbalized understanding of the instructions. No further questions at time of discharge.    ED Discharge Orders    None       Follow Up: Charlott Rakes, MD [place] [address] [phone]  Schedule an appointment as soon as possible for a visit  As needed      This chart was dictated using voice recognition software.  Despite best efforts to proofread,  errors can occur which can change the documentation meaning.   Fatima Blank, MD 08/14/17 1315

## 2017-08-14 NOTE — ED Triage Notes (Signed)
Patient c/o dizziness, N/V and bleeding in ear after removing headphones after dialysis yesterday. States complete dialysis yesterday.

## 2017-08-14 NOTE — ED Triage Notes (Signed)
Pt provided with a soda to drink.

## 2017-08-17 ENCOUNTER — Encounter: Payer: Self-pay | Admitting: Family Medicine

## 2017-08-17 ENCOUNTER — Ambulatory Visit: Payer: Medicare Other | Admitting: Pulmonary Disease

## 2017-08-17 ENCOUNTER — Ambulatory Visit: Payer: Medicare Other | Attending: Family Medicine | Admitting: Family Medicine

## 2017-08-17 VITALS — BP 147/82 | HR 88 | Temp 98.2°F | Wt 181.4 lb

## 2017-08-17 DIAGNOSIS — I10 Essential (primary) hypertension: Secondary | ICD-10-CM

## 2017-08-17 DIAGNOSIS — G8929 Other chronic pain: Secondary | ICD-10-CM

## 2017-08-17 DIAGNOSIS — J4520 Mild intermittent asthma, uncomplicated: Secondary | ICD-10-CM | POA: Insufficient documentation

## 2017-08-17 DIAGNOSIS — M545 Low back pain: Secondary | ICD-10-CM | POA: Diagnosis not present

## 2017-08-17 DIAGNOSIS — Z79899 Other long term (current) drug therapy: Secondary | ICD-10-CM | POA: Insufficient documentation

## 2017-08-17 DIAGNOSIS — R42 Dizziness and giddiness: Secondary | ICD-10-CM | POA: Diagnosis not present

## 2017-08-17 DIAGNOSIS — Z888 Allergy status to other drugs, medicaments and biological substances status: Secondary | ICD-10-CM | POA: Insufficient documentation

## 2017-08-17 DIAGNOSIS — I120 Hypertensive chronic kidney disease with stage 5 chronic kidney disease or end stage renal disease: Secondary | ICD-10-CM | POA: Diagnosis not present

## 2017-08-17 DIAGNOSIS — E1169 Type 2 diabetes mellitus with other specified complication: Secondary | ICD-10-CM | POA: Diagnosis not present

## 2017-08-17 DIAGNOSIS — N186 End stage renal disease: Secondary | ICD-10-CM | POA: Diagnosis not present

## 2017-08-17 DIAGNOSIS — E1122 Type 2 diabetes mellitus with diabetic chronic kidney disease: Secondary | ICD-10-CM | POA: Diagnosis not present

## 2017-08-17 DIAGNOSIS — Z880 Allergy status to penicillin: Secondary | ICD-10-CM | POA: Diagnosis not present

## 2017-08-17 DIAGNOSIS — Z1159 Encounter for screening for other viral diseases: Secondary | ICD-10-CM | POA: Diagnosis not present

## 2017-08-17 DIAGNOSIS — Z9889 Other specified postprocedural states: Secondary | ICD-10-CM | POA: Insufficient documentation

## 2017-08-17 DIAGNOSIS — Z886 Allergy status to analgesic agent status: Secondary | ICD-10-CM | POA: Insufficient documentation

## 2017-08-17 DIAGNOSIS — K219 Gastro-esophageal reflux disease without esophagitis: Secondary | ICD-10-CM | POA: Diagnosis not present

## 2017-08-17 MED ORDER — GABAPENTIN 300 MG PO CAPS: 300.0000 mg | ORAL_CAPSULE | Freq: Two times a day (BID) | ORAL | 3 refills | Status: DC

## 2017-08-17 MED ORDER — CARVEDILOL 6.25 MG PO TABS: 6.2500 mg | ORAL_TABLET | Freq: Two times a day (BID) | ORAL | 3 refills | Status: DC

## 2017-08-17 MED ORDER — MECLIZINE HCL 25 MG PO TABS: 25.0000 mg | ORAL_TABLET | Freq: Two times a day (BID) | ORAL | 3 refills | Status: DC | PRN

## 2017-08-17 NOTE — Progress Notes (Signed)
Subjective:  Patient ID: Rachael George, female    DOB: 1955/12/29  Age: 62 y.o. MRN: [id number]  CC: Hypertension   HPI Rachael George is a 62 year old female with a history of type 2 diabetes mellitus (A1c 6.0 diet-controlled) hypertension, hyperlipidemia, end-stage renal disease (on hemodialysis Mondays, Wednesdays and Fridays), Gout, asthma here for follow-up visit. She had an ED visit on 08/14/2017 for vertigo after she had presented with dizziness but reports she received no medications at discharge from the ED.  She obtained OTC vertigo medications which has provided minimal relief.  Her blood pressure is slightly elevated and she informs me her nephrologist had placed her on an ARB which she has not been taking as per instructions from the ED physician given she had previous anaphylaxis with lisinopril.  She states  on some occasions after dialysis she has been lightheaded. She is currently on the kidney transplant list at [facility]. Her diabetes is diet controlled and she currently does not take a statin. Denies asthma exacerbations and has been compliant with her inhalers but is currently seeing a pulmonologist. Her last mammogram was in 05/2016 at [facility]. She has no additional concerns today.  Past Medical History:  Diagnosis Date  . Anemia   . Asthma   . ESRD (end stage renal disease) ([place])    MWF  . GERD (gastroesophageal reflux disease)   . Gout   . 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 Heavy menstrual bleeding   . High cholesterol   . History of blood transfusion 02/2016- last one   1990's- several ones   . History of kidney stones 1980's  . Hyperlipemia 12/07/2012  . Hypertension   . Hypothyroidism   . Kidney stones   . Pre-diabetes   . Prediabetes   . RA (rheumatoid arthritis) ([place])   . Sleep apnea    [place]- have CPAP, not able to use every night     Past Surgical History:  Procedure Laterality Date  .  A/V SHUNTOGRAM N/A 09/08/2016   Procedure: A/V Shuntogram - Left Arm AV Graft;  Surgeon: Serafina Mitchell, MD;  Location: [place] [facility];  Service: Cardiovascular;  Laterality: N/A;  . ABDOMINAL HYSTERECTOMY  2000  . AV FISTULA PLACEMENT Left 05/25/2016   Procedure: INSERTION OF LEFT UPPER ARM ARTERIOVENOUS (AV) LOOP GORE-TEX GRAFT ARM;  Surgeon: Elam Dutch, MD;  Location: [place];  Service: Vascular;  Laterality: Left;  . BASCILIC VEIN TRANSPOSITION Right 12/06/2014   Procedure: FIRST STAGE BASILIC VEIN TRANSPOSITION - RIGHT;  Surgeon: Serafina Mitchell, MD;  Location: [place];  Service: Vascular;  Laterality: Right;  . BASCILIC VEIN TRANSPOSITION Right 02/07/2015   Procedure: RIGHT ARM SECOND STAGE BASILIC VEIN TRANSPOSITION;  Surgeon: Serafina Mitchell, MD;  Location: [place];  Service: Vascular;  Laterality: Right;  . BASCILIC VEIN TRANSPOSITION Left 04/22/2016   Procedure: FIRST STAGE BASILIC VEIN TRANSPOSITION LEFT ARM;  Surgeon: Conrad Langlois, MD;  Location: [place];  Service: Vascular;  Laterality: Left;  . COLONOSCOPY W/ POLYPECTOMY    . ECTOPIC PREGNANCY SURGERY  02/1982  . EXCHANGE OF A DIALYSIS CATHETER Right 04/22/2016   Procedure: EXCHANGE OF A DIALYSIS CATHETER - INSERTION RIGHT INTERNAL JUGULAR & REMOVAL FROM LEFT INTERNAL JUGULAR;  Surgeon: Conrad Taft, MD;  Location: [place];  Service: Vascular;  Laterality: Right;  . I&D EXTREMITY Right 02/07/2015   Procedure: IRRIGATION AND  DEBRIDEMENT RIGHT ARM HEMATOMA;  Surgeon: Serafina Mitchell, MD;  Location: [facility];  Service: Vascular;  Laterality: Right;  . INSERTION OF DIALYSIS CATHETER  03/03/2016   Procedure: INSERTION OF DIALYSIS CATHETER;  Surgeon: Elam Dutch, MD;  Location: [place];  Service: Vascular;;  . IR AV DIALY SHUNT INTRO NEEDLE/INTRACATH INITIAL W/PTA/IMG LEFT  04/20/2017  . IR AV DIALY SHUNT INTRO NEEDLE/INTRACATH INITIAL W/PTA/IMG LEFT  06/24/2017  . IR THROMBECTOMY AV FISTULA W/THROMBOLYSIS/PTA INC/SHUNT/IMG LEFT Left 01/04/2017   . IR US GUIDE VASC ACCESS LEFT  01/04/2017  . left shoulder surgery  08/2005  . LIGATION OF ARTERIOVENOUS  FISTULA  03/03/2016   Procedure: LIGATION OF ARTERIOVENOUS  FISTULA;  Surgeon: Elam Dutch, MD;  Location: [place];  Service: Vascular;;  . PERIPHERAL VASCULAR BALLOON ANGIOPLASTY  09/08/2016   Procedure: Peripheral Vascular Balloon Angioplasty;  Surgeon: Serafina Mitchell, MD;  Location: [place] [facility];  Service: Cardiovascular;;  left avf  . TEE WITHOUT CARDIOVERSION N/A 03/06/2016   Procedure: TRANSESOPHAGEAL ECHOCARDIOGRAM (TEE);  Surgeon: Sueanne Margarita, MD;  Location: [place];  Service: Cardiovascular;  Laterality: N/A;  . TUBAL LIGATION  11/1986    Allergies  Allergen Reactions  . Atacand [Candesartan] Anaphylaxis    Swelling of the mouth and tongue.  Marland Kitchen Lisinopril Anaphylaxis    Swelling of the mouth and tongue.  . Nsaids Anaphylaxis    Swelling of the mouth and tongue. Swelling of the mouth and tongue.  Marland Kitchen Penicillins Rash    Has patient had a PCN reaction causing immediate rash, facial/tongue/throat swelling, SOB or lightheadedness with hypotension: No Has patient had a PCN reaction causing severe rash involving mucus membranes or skin necrosis: No Has patient had a PCN reaction that required hospitalization No Has patient had a PCN reaction occurring within the last 10 years: No If all of the above answers are "NO", then may proceed with Cephalosporin use.      Outpatient Medications Prior to Visit  Medication Sig Dispense Refill  . acetaminophen (TYLENOL) 500 MG tablet Take 1,000 mg by mouth every 6 (six) hours as needed for mild pain.     Marland Kitchen albuterol (PROVENTIL) (2.5 MG/3ML) 0.083% nebulizer solution Take 3 mLs (2.5 mg total) by nebulization every 6 (six) hours as needed for wheezing or shortness of breath. ICD 10:J45.20 75 mL 12  . allopurinol (ZYLOPRIM) 100 MG tablet Take 100 mg by mouth 2 (two) times daily. Reported on 09/02/2015    . amLODipine  (NORVASC) 10 MG tablet Take 10 mg by mouth at bedtime.     Marland Kitchen b complex-vitamin c-folic acid (NEPHRO-VITE) 0.8 MG TABS tablet Take 1 tablet by mouth every morning.     . cetirizine (ZYRTEC) 10 MG tablet TAKE ONE (1) TABLET BY MOUTH EVERY DAY 30 tablet 3  . chlorpheniramine (CHLOR-TRIMETON) 4 MG tablet Take 1 tablet (4 mg total) by mouth 3 (three) times daily. 190 tablet 0  . cinacalcet (SENSIPAR) 60 MG tablet Take 60 mg by mouth every Monday, Wednesday, and Friday at 6 PM.     . clotrimazole (LOTRIMIN) 1 % cream Apply 1 application topically 2 (two) times daily. Apply to inner thighs 60 g 1  . colchicine 0.6 MG tablet Take 2 tabs (1.2mg ) at the onset of a gout flare, may repeat 1 tab (0.6mg ) if symptoms persist (Patient taking differently: Take 0.6-1.2 mg by mouth 2 (two) times daily as needed (for gout flare up as instructed). Take 2 tabs (1.2mg ) at the  onset of a gout flare, may repeat 1 tab (0.6mg ) if symptoms persist) 30 tablet 2  . cyclobenzaprine (FLEXERIL) 10 MG tablet Take 1 tablet (10 mg total) by mouth 2 (two) times daily as needed. For back pain 60 tablet 1  . famotidine (PEPCID AC MAXIMUM STRENGTH) 20 MG tablet Take 20 mg by mouth 2 (two) times daily.    . fluticasone (FLONASE) 50 MCG/ACT nasal spray Place 2 sprays into both nostrils daily. 16 g 2  . pantoprazole (PROTONIX) 40 MG tablet Take 40 mg by mouth 2 (two) times daily.    . Peak Flow Meter DEVI Use as directed 1 each 0  . sucroferric oxyhydroxide (VELPHORO) 500 MG chewable tablet Chew 1,000-1,500 mg by mouth See admin instructions. Take 3 tablets (15oomg) three times daily with meals, and 2 tablets (1000mg ) with snacks    . triamcinolone cream (KENALOG) 0.1 % Apply 1 application topically 2 (two) times daily. Applied to eczematous lesions 80 g 1  . VENTOLIN HFA 108 (90 Base) MCG/ACT inhaler INHALE 2 PUFFS INTO THE LUNGS EVERY 6 HOURS AS NEEDED FOR WHEEZING ORSHORTNESS OF BREATH 18 g 1  . carvedilol (COREG) 6.25 MG tablet Take 1  tablet (6.25 mg total) by mouth 2 (two) times daily with a meal. 60 tablet 3  . gabapentin (NEURONTIN) 300 MG capsule Take 1 capsule (300 mg total) by mouth 2 (two) times daily. 60 capsule 3  . budesonide-formoterol (SYMBICORT) 160-4.5 MCG/ACT inhaler Inhale 2 puffs into the lungs 2 (two) times daily for 1 day. 1 Inhaler 3   No facility-administered medications prior to visit.     ROS Review of Systems  Constitutional: Negative for activity change, appetite change and fatigue.  HENT: Negative for congestion, sinus pressure and sore throat.   Eyes: Negative for visual disturbance.  Respiratory: Negative for cough, chest tightness, shortness of breath and wheezing.   Cardiovascular: Negative for chest pain and palpitations.  Gastrointestinal: Negative for abdominal distention, abdominal pain and constipation.  Endocrine: Negative for polydipsia.  Genitourinary: Negative for dysuria and frequency.  Musculoskeletal: Negative for arthralgias and back pain.  Skin: Negative for rash.  Neurological: Positive for light-headedness. Negative for tremors and numbness.  Hematological: Does not bruise/bleed easily.  Psychiatric/Behavioral: Negative for agitation and behavioral problems.    Objective:  BP (!) 147/82   Pulse 88   Temp 98.2 F (36.8 C) (Oral)   Wt 181 lb 6.4 oz (82.3 kg)   SpO2 99%   BMI 33.18 kg/m   BP/Weight 08/17/2017 08/14/2017 3/88/8280  Systolic BP 034 917 915  Diastolic BP 82 84 89  Wt. (Lbs) 181.4 180 178  BMI 33.18 32.92 32.56      Physical Exam  Constitutional: She is oriented to person, place, and time. She appears well-developed and well-nourished.  Cardiovascular: Normal rate, normal heart sounds and intact distal pulses.  No murmur heard. Pulmonary/Chest: Effort normal and breath sounds normal. She has no wheezes. She has no rales. She exhibits no tenderness.  Abdominal: Soft. Bowel sounds are normal. She exhibits no distension and no mass. There is no  tenderness.  Musculoskeletal: Normal range of motion.  Left forearm AV fistula with palpable thrill  Neurological: She is alert and oriented to person, place, and time.  Skin: Skin is warm and dry.  Psychiatric: She has a normal mood and affect.     CMP Latest Ref Rng & Units 08/14/2017 01/04/2017 01/04/2017  Glucose 65 - 99 mg/dL 103(H) 99 100(H)  BUN 6 -  20 mg/dL 39(H) 99(H) 110(H)  Creatinine 0.44 - 1.00 mg/dL 6.79(H) 10.80(H) 10.41(H)  Sodium 135 - 145 mmol/L 139 140 140  Potassium 3.5 - 5.1 mmol/L 4.2 5.3(H) 6.2(H)  Chloride 101 - 111 mmol/L 95(L) 106 104  CO2 22 - 32 mmol/L 28 - 21(L)  Calcium 8.9 - 10.3 mg/dL 9.4 - 9.7  Total Protein 6.0 - 8.3 g/dL - - -  Total Bilirubin 0.2 - 1.2 mg/dL - - -  Alkaline Phos 39 - 117 U/L - - -  AST 0 - 37 U/L - - -  ALT 0 - 35 U/L - - -    Lab Results  Component Value Date   HGBA1C 6.0 03/30/2017     Assessment & Plan:   1. Essential hypertension, benign Blood pressure slightly elevated No regimen change today as she is likely to develop hypotension after dialysis - carvedilol (COREG) 6.25 MG tablet; Take 1 tablet (6.25 mg total) by mouth 2 (two) times daily with a meal.  Dispense: 60 tablet; Refill: 3  2. Chronic right-sided low back pain without sciatica Stable - gabapentin (NEURONTIN) 300 MG capsule; Take 1 capsule (300 mg total) by mouth 2 (two) times daily.  Dispense: 60 capsule; Refill: 3  3. Need for hepatitis C screening test - Hepatitis c antibody (reflex)  4. Vertigo - meclizine (ANTIVERT) 25 MG tablet; Take 1 tablet (25 mg total) by mouth 2 (two) times daily as needed for dizziness.  Dispense: 60 tablet; Refill: 3  5. Mild intermittent asthma without complication Stable Continue inhalers Currently managed by pulmonary  6. End stage renal disease (HCC) - Hepatitis B surface antibody  7. Type 2 diabetes mellitus with other specified complication, without long-term current use of insulin (HCC) Diet controlled -  Lipid panel   Meds ordered this encounter  Medications  . meclizine (ANTIVERT) 25 MG tablet    Sig: Take 1 tablet (25 mg total) by mouth 2 (two) times daily as needed for dizziness.    Dispense:  60 tablet    Refill:  3  . carvedilol (COREG) 6.25 MG tablet    Sig: Take 1 tablet (6.25 mg total) by mouth 2 (two) times daily with a meal.    Dispense:  60 tablet    Refill:  3  . gabapentin (NEURONTIN) 300 MG capsule    Sig: Take 1 capsule (300 mg total) by mouth 2 (two) times daily.    Dispense:  60 capsule    Refill:  3    Follow-up: Return in about 3 months (around 11/17/2017) for follow up of chronic medical conditions.   Charlott Rakes MD

## 2017-08-17 NOTE — Patient Instructions (Signed)
Vertigo °Vertigo is the feeling that you or your surroundings are moving when they are not. Vertigo can be dangerous if it occurs while you are doing something that could endanger you or others, such as driving. °What are the causes? °This condition is caused by a disturbance in the signals that are sent by your body’s sensory systems to your brain. Different causes of a disturbance can lead to vertigo, including: °· Infections, especially in the inner ear. °· A bad reaction to a drug, or misuse of alcohol and medicines. °· Withdrawal from drugs or alcohol. °· Quickly changing positions, as when lying down or rolling over in bed. °· Migraine headaches. °· Decreased blood flow to the brain. °· Decreased blood pressure. °· Increased pressure in the brain from a head or neck injury, stroke, infection, tumor, or bleeding. °· Central nervous system disorders. ° °What are the signs or symptoms? °Symptoms of this condition usually occur when you move your head or your eyes in different directions. Symptoms may start suddenly, and they usually last for less than a minute. Symptoms may include: °· Loss of balance and falling. °· Feeling like you are spinning or moving. °· Feeling like your surroundings are spinning or moving. °· Nausea and vomiting. °· Blurred vision or double vision. °· Difficulty hearing. °· Slurred speech. °· Dizziness. °· Involuntary eye movement (nystagmus). ° °Symptoms can be mild and cause only slight annoyance, or they can be severe and interfere with daily life. Episodes of vertigo may return (recur) over time, and they are often triggered by certain movements. Symptoms may improve over time. °How is this diagnosed? °This condition may be diagnosed based on medical history and the quality of your nystagmus. Your health care provider may test your eye movements by asking you to quickly change positions to trigger the nystagmus. This may be called the Dix-Hallpike test, head thrust test, or roll test.  You may be referred to a health care provider who specializes in ear, nose, and throat (ENT) problems (otolaryngologist) or a provider who specializes in disorders of the central nervous system (neurologist). °You may have additional testing, including: °· A physical exam. °· Blood tests. °· MRI. °· A CT scan. °· An electrocardiogram (ECG). This records electrical activity in your heart. °· An electroencephalogram (EEG). This records electrical activity in your brain. °· Hearing tests. ° °How is this treated? °Treatment for this condition depends on the cause and the severity of the symptoms. Treatment options include: °· Medicines to treat nausea or vertigo. These are usually used for severe cases. Some medicines that are used to treat other conditions may also reduce or eliminate vertigo symptoms. These include: °? Medicines that control allergies (antihistamines). °? Medicines that control seizures (anticonvulsants). °? Medicines that relieve depression (antidepressants). °? Medicines that relieve anxiety (sedatives). °· Head movements to adjust your inner ear back to normal. If your vertigo is caused by an ear problem, your health care provider may recommend certain movements to correct the problem. °· Surgery. This is rare. ° °Follow these instructions at home: °Safety °· Move slowly.Avoid sudden body or head movements. °· Avoid driving. °· Avoid operating heavy machinery. °· Avoid doing any tasks that would cause danger to you or others if you would have a vertigo episode during the task. °· If you have trouble walking or keeping your balance, try using a cane for stability. If you feel dizzy or unstable, sit down right away. °· Return to your normal activities as told by your   health care provider. Ask your health care provider what activities are safe for you. °General instructions °· Take over-the-counter and prescription medicines only as told by your health care provider. °· Avoid certain positions or  movements as told by your health care provider. °· Drink enough fluid to keep your urine clear or pale yellow. °· Keep all follow-up visits as told by your health care provider. This is important. °Contact a health care provider if: °· Your medicines do not relieve your vertigo or they make it worse. °· You have a fever. °· Your condition gets worse or you develop new symptoms. °· Your family or friends notice any behavioral changes. °· Your nausea or vomiting gets worse. °· You have numbness or a “pins and needles” sensation in part of your body. °Get help right away if: °· You have difficulty moving or speaking. °· You are always dizzy. °· You faint. °· You develop severe headaches. °· You have weakness in your hands, arms, or legs. °· You have changes in your hearing or vision. °· You develop a stiff neck. °· You develop sensitivity to light. °This information is not intended to replace advice given to you by your health care provider. Make sure you discuss any questions you have with your health care provider. °Document Released: 12/17/2004 Document Revised: 08/21/2015 Document Reviewed: 07/02/2014 °Elsevier Interactive Patient Education © 2018 [employer] ° °

## 2017-08-18 ENCOUNTER — Other Ambulatory Visit: Payer: Self-pay | Admitting: Family Medicine

## 2017-08-18 DIAGNOSIS — N186 End stage renal disease: Secondary | ICD-10-CM | POA: Diagnosis not present

## 2017-08-18 DIAGNOSIS — N2581 Secondary hyperparathyroidism of renal origin: Secondary | ICD-10-CM | POA: Diagnosis not present

## 2017-08-18 DIAGNOSIS — E1129 Type 2 diabetes mellitus with other diabetic kidney complication: Secondary | ICD-10-CM | POA: Diagnosis not present

## 2017-08-18 DIAGNOSIS — D631 Anemia in chronic kidney disease: Secondary | ICD-10-CM | POA: Diagnosis not present

## 2017-08-18 LAB — LIPID PANEL
Chol/HDL Ratio: 8.4 ratio — ABNORMAL HIGH (ref 0.0–4.4)
Cholesterol, Total: 329 mg/dL — ABNORMAL HIGH (ref 100–199)
HDL: 39 mg/dL — ABNORMAL LOW (ref >39)
LDL Calculated: 241 mg/dL — ABNORMAL HIGH (ref 0–99)
Triglycerides: 244 mg/dL — ABNORMAL HIGH (ref 0–149)
VLDL Cholesterol Cal: 49 mg/dL — ABNORMAL HIGH (ref 5–40)

## 2017-08-18 LAB — HEPATITIS B SURFACE ANTIBODY, QUANTITATIVE: Hepatitis B Surf Ab Quant: 633.6 m[IU]/mL (ref >9.9)

## 2017-08-18 LAB — HCV COMMENT:

## 2017-08-18 LAB — HEPATITIS C ANTIBODY (REFLEX): HCV Ab: <0.1 s/co ratio (ref 0.0–0.9)

## 2017-08-18 MED ORDER — ATORVASTATIN CALCIUM 40 MG PO TABS: 40.0000 mg | ORAL_TABLET | Freq: Every day | ORAL | 6 refills | Status: DC

## 2017-08-20 ENCOUNTER — Telehealth: Payer: Self-pay

## 2017-08-20 DIAGNOSIS — D631 Anemia in chronic kidney disease: Secondary | ICD-10-CM | POA: Diagnosis not present

## 2017-08-20 DIAGNOSIS — N186 End stage renal disease: Secondary | ICD-10-CM | POA: Diagnosis not present

## 2017-08-20 DIAGNOSIS — E1129 Type 2 diabetes mellitus with other diabetic kidney complication: Secondary | ICD-10-CM | POA: Diagnosis not present

## 2017-08-20 DIAGNOSIS — N2581 Secondary hyperparathyroidism of renal origin: Secondary | ICD-10-CM | POA: Diagnosis not present

## 2017-08-20 MED ORDER — ATORVASTATIN CALCIUM 40 MG PO TABS: 40.0000 mg | ORAL_TABLET | Freq: Every day | ORAL | 6 refills | Status: DC

## 2017-08-20 NOTE — Telephone Encounter (Signed)
Patient was called and informed of lab results. 

## 2017-08-21 DIAGNOSIS — I129 Hypertensive chronic kidney disease with stage 1 through stage 4 chronic kidney disease, or unspecified chronic kidney disease: Secondary | ICD-10-CM | POA: Diagnosis not present

## 2017-08-21 DIAGNOSIS — N186 End stage renal disease: Secondary | ICD-10-CM | POA: Diagnosis not present

## 2017-08-21 DIAGNOSIS — Z992 Dependence on renal dialysis: Secondary | ICD-10-CM | POA: Diagnosis not present

## 2017-08-23 DIAGNOSIS — D631 Anemia in chronic kidney disease: Secondary | ICD-10-CM | POA: Diagnosis not present

## 2017-08-23 DIAGNOSIS — N2581 Secondary hyperparathyroidism of renal origin: Secondary | ICD-10-CM | POA: Diagnosis not present

## 2017-08-23 DIAGNOSIS — N186 End stage renal disease: Secondary | ICD-10-CM | POA: Diagnosis not present

## 2017-08-23 DIAGNOSIS — E1129 Type 2 diabetes mellitus with other diabetic kidney complication: Secondary | ICD-10-CM | POA: Diagnosis not present

## 2017-08-25 DIAGNOSIS — N186 End stage renal disease: Secondary | ICD-10-CM | POA: Diagnosis not present

## 2017-08-25 DIAGNOSIS — D631 Anemia in chronic kidney disease: Secondary | ICD-10-CM | POA: Diagnosis not present

## 2017-08-25 DIAGNOSIS — E1129 Type 2 diabetes mellitus with other diabetic kidney complication: Secondary | ICD-10-CM | POA: Diagnosis not present

## 2017-08-25 DIAGNOSIS — N2581 Secondary hyperparathyroidism of renal origin: Secondary | ICD-10-CM | POA: Diagnosis not present

## 2017-08-27 DIAGNOSIS — D631 Anemia in chronic kidney disease: Secondary | ICD-10-CM | POA: Diagnosis not present

## 2017-08-27 DIAGNOSIS — N186 End stage renal disease: Secondary | ICD-10-CM | POA: Diagnosis not present

## 2017-08-27 DIAGNOSIS — N2581 Secondary hyperparathyroidism of renal origin: Secondary | ICD-10-CM | POA: Diagnosis not present

## 2017-08-27 DIAGNOSIS — E1129 Type 2 diabetes mellitus with other diabetic kidney complication: Secondary | ICD-10-CM | POA: Diagnosis not present

## 2017-08-30 DIAGNOSIS — E1129 Type 2 diabetes mellitus with other diabetic kidney complication: Secondary | ICD-10-CM | POA: Diagnosis not present

## 2017-08-30 DIAGNOSIS — D631 Anemia in chronic kidney disease: Secondary | ICD-10-CM | POA: Diagnosis not present

## 2017-08-30 DIAGNOSIS — N2581 Secondary hyperparathyroidism of renal origin: Secondary | ICD-10-CM | POA: Diagnosis not present

## 2017-08-30 DIAGNOSIS — N186 End stage renal disease: Secondary | ICD-10-CM | POA: Diagnosis not present

## 2017-09-01 DIAGNOSIS — N186 End stage renal disease: Secondary | ICD-10-CM | POA: Diagnosis not present

## 2017-09-01 DIAGNOSIS — E1129 Type 2 diabetes mellitus with other diabetic kidney complication: Secondary | ICD-10-CM | POA: Diagnosis not present

## 2017-09-01 DIAGNOSIS — N2581 Secondary hyperparathyroidism of renal origin: Secondary | ICD-10-CM | POA: Diagnosis not present

## 2017-09-01 DIAGNOSIS — D631 Anemia in chronic kidney disease: Secondary | ICD-10-CM | POA: Diagnosis not present

## 2017-09-02 ENCOUNTER — Encounter (HOSPITAL_COMMUNITY): Payer: Self-pay | Admitting: Vascular Surgery

## 2017-09-02 ENCOUNTER — Ambulatory Visit (HOSPITAL_COMMUNITY)
Admission: RE | Admit: 2017-09-02 | Discharge: 2017-09-02 | Disposition: A | Discharge status: Home / Self Care | Payer: Medicare Other | Source: Ambulatory Visit | Attending: Vascular Surgery | Admitting: Vascular Surgery

## 2017-09-02 ENCOUNTER — Encounter (HOSPITAL_COMMUNITY)
Admission: RE | Disposition: A | Discharge status: Home / Self Care | Payer: Self-pay | Source: Ambulatory Visit | Attending: Vascular Surgery

## 2017-09-02 DIAGNOSIS — K219 Gastro-esophageal reflux disease without esophagitis: Secondary | ICD-10-CM | POA: Diagnosis not present

## 2017-09-02 DIAGNOSIS — Z880 Allergy status to penicillin: Secondary | ICD-10-CM | POA: Diagnosis not present

## 2017-09-02 DIAGNOSIS — R7303 Prediabetes: Secondary | ICD-10-CM | POA: Insufficient documentation

## 2017-09-02 DIAGNOSIS — G4730 Sleep apnea, unspecified: Secondary | ICD-10-CM | POA: Insufficient documentation

## 2017-09-02 DIAGNOSIS — I871 Compression of vein: Secondary | ICD-10-CM | POA: Diagnosis not present

## 2017-09-02 DIAGNOSIS — I120 Hypertensive chronic kidney disease with stage 5 chronic kidney disease or end stage renal disease: Secondary | ICD-10-CM | POA: Insufficient documentation

## 2017-09-02 DIAGNOSIS — Y832 Surgical operation with anastomosis, bypass or graft as the cause of abnormal reaction of the patient, or of later complication, without mention of misadventure at the time of the procedure: Secondary | ICD-10-CM | POA: Diagnosis not present

## 2017-09-02 DIAGNOSIS — T82898A Other specified complication of vascular prosthetic devices, implants and grafts, initial encounter: Secondary | ICD-10-CM | POA: Diagnosis not present

## 2017-09-02 DIAGNOSIS — E7800 Pure hypercholesterolemia, unspecified: Secondary | ICD-10-CM | POA: Diagnosis not present

## 2017-09-02 DIAGNOSIS — Z8249 Family history of ischemic heart disease and other diseases of the circulatory system: Secondary | ICD-10-CM | POA: Insufficient documentation

## 2017-09-02 DIAGNOSIS — E039 Hypothyroidism, unspecified: Secondary | ICD-10-CM | POA: Insufficient documentation

## 2017-09-02 DIAGNOSIS — N186 End stage renal disease: Secondary | ICD-10-CM | POA: Diagnosis present

## 2017-09-02 DIAGNOSIS — M069 Rheumatoid arthritis, unspecified: Secondary | ICD-10-CM | POA: Insufficient documentation

## 2017-09-02 DIAGNOSIS — M109 Gout, unspecified: Secondary | ICD-10-CM | POA: Insufficient documentation

## 2017-09-02 DIAGNOSIS — J45909 Unspecified asthma, uncomplicated: Secondary | ICD-10-CM | POA: Diagnosis not present

## 2017-09-02 DIAGNOSIS — Z7951 Long term (current) use of inhaled steroids: Secondary | ICD-10-CM | POA: Insufficient documentation

## 2017-09-02 DIAGNOSIS — T82858A Stenosis of vascular prosthetic devices, implants and grafts, initial encounter: Secondary | ICD-10-CM | POA: Diagnosis not present

## 2017-09-02 DIAGNOSIS — Z992 Dependence on renal dialysis: Secondary | ICD-10-CM | POA: Diagnosis not present

## 2017-09-02 HISTORY — PX: PERIPHERAL VASCULAR BALLOON ANGIOPLASTY: CATH118281

## 2017-09-02 HISTORY — PX: A/V SHUNTOGRAM: CATH118297

## 2017-09-02 LAB — POCT I-STAT, CHEM 8
BUN: 31 mg/dL — ABNORMAL HIGH (ref 6–20)
Calcium, Ion: 1.15 mmol/L (ref 1.15–1.40)
Chloride: 97 mmol/L — ABNORMAL LOW (ref 101–111)
Creatinine, Ser: 6.2 mg/dL — ABNORMAL HIGH (ref 0.44–1.00)
Glucose, Bld: 106 mg/dL — ABNORMAL HIGH (ref 65–99)
HCT: 31 % — ABNORMAL LOW (ref 36.0–46.0)
Hemoglobin: 10.5 g/dL — ABNORMAL LOW (ref 12.0–15.0)
Potassium: 3.5 mmol/L (ref 3.5–5.1)
Sodium: 138 mmol/L (ref 135–145)
TCO2: 32 mmol/L (ref 22–32)

## 2017-09-02 SURGERY — A/V SHUNTOGRAM
Anesthesia: LOCAL

## 2017-09-02 MED ORDER — LIDOCAINE HCL (PF) 1 % IJ SOLN
INTRAMUSCULAR | Status: AC
  Filled 2017-09-02: qty 30

## 2017-09-02 MED ORDER — HEPARIN (PORCINE) IN NACL 2-0.9 UNITS/ML
INTRAMUSCULAR | Status: AC | PRN
  Administered 2017-09-02: 500 mL

## 2017-09-02 MED ORDER — ONDANSETRON HCL 4 MG/2ML IJ SOLN: 4.0000 mg | Freq: Four times a day (QID) | INTRAMUSCULAR | Status: DC | PRN

## 2017-09-02 MED ORDER — LABETALOL HCL 5 MG/ML IV SOLN
10.0000 mg | INTRAVENOUS | Status: DC | PRN
  Administered 2017-09-02: 10 mg via INTRAVENOUS

## 2017-09-02 MED ORDER — SODIUM CHLORIDE 0.9 % IV SOLN: 250.0000 mL | INTRAVENOUS | Status: DC | PRN

## 2017-09-02 MED ORDER — HEPARIN SODIUM (PORCINE) 1000 UNIT/ML IJ SOLN
INTRAMUSCULAR | Status: DC | PRN
  Administered 2017-09-02: 3000 [IU] via INTRAVENOUS

## 2017-09-02 MED ORDER — HEPARIN (PORCINE) IN NACL 1000-0.9 UT/500ML-% IV SOLN
INTRAVENOUS | Status: AC
  Filled 2017-09-02: qty 500

## 2017-09-02 MED ORDER — LABETALOL HCL 5 MG/ML IV SOLN
INTRAVENOUS | Status: AC
  Filled 2017-09-02: qty 4

## 2017-09-02 MED ORDER — SODIUM CHLORIDE 0.9% FLUSH: 3.0000 mL | INTRAVENOUS | Status: DC | PRN

## 2017-09-02 MED ORDER — ACETAMINOPHEN 325 MG PO TABS: 650.0000 mg | ORAL_TABLET | ORAL | Status: DC | PRN

## 2017-09-02 MED ORDER — SODIUM CHLORIDE 0.9% FLUSH: 3.0000 mL | Freq: Two times a day (BID) | INTRAVENOUS | Status: DC

## 2017-09-02 MED ORDER — IODIXANOL 320 MG/ML IV SOLN
INTRAVENOUS | Status: DC | PRN
  Administered 2017-09-02: 25 mL via INTRAVENOUS

## 2017-09-02 MED ORDER — HEPARIN SODIUM (PORCINE) 1000 UNIT/ML IJ SOLN
INTRAMUSCULAR | Status: AC
  Filled 2017-09-02: qty 1

## 2017-09-02 MED ORDER — HYDRALAZINE HCL 20 MG/ML IJ SOLN: 5.0000 mg | INTRAMUSCULAR | Status: DC | PRN

## 2017-09-02 MED ORDER — LIDOCAINE HCL (PF) 1 % IJ SOLN
INTRAMUSCULAR | Status: DC | PRN
  Administered 2017-09-02: 5 mL

## 2017-09-02 SURGICAL SUPPLY — 15 items
BALLN MUSTANG 6X80X75 (BALLOONS) ×3
BALLN MUSTANG 8X60X75 (BALLOONS) ×3
BALLOON MUSTANG 6X80X75 (BALLOONS) ×2 IMPLANT
BALLOON MUSTANG 8X60X75 (BALLOONS) ×2 IMPLANT
COVER DOME SNAP 22 D (MISCELLANEOUS) ×3 IMPLANT
COVER PRB 48X5XTLSCP FOLD TPE (BAG) ×4 IMPLANT
COVER PROBE 5X48 (BAG) ×2
KIT ENCORE 26 ADVANTAGE (KITS) ×3 IMPLANT
KIT MICROPUNCTURE NIT STIFF (SHEATH) ×3 IMPLANT
PROTECTION STATION PRESSURIZED (MISCELLANEOUS) ×3
SHEATH PINNACLE R/O II 6F 4CM (SHEATH) ×3 IMPLANT
STATION PROTECTION PRESSURIZED (MISCELLANEOUS) ×2 IMPLANT
TRAY PV CATH (CUSTOM PROCEDURE TRAY) ×3 IMPLANT
TUBING CIL FLEX 10 FLL-RA (TUBING) ×3 IMPLANT
WIRE BENTSON .035X145CM (WIRE) ×3 IMPLANT

## 2017-09-02 NOTE — Op Note (Signed)
OPERATIVE NOTE   PROCEDURE: 1.  left upper arm arteriovenous graft cannulation under ultrasound guidance 2.  left arm shuntogram 3.  Venoplasty of left axillary vein x 2 (6 mm x 80 mm, 8 mm x 60 mm) 4.  Venoplasty of left innominate vein x 2 (6 mm x 80 mm, 8 mm x 60 mm)  PRE-OPERATIVE DIAGNOSIS: left upper arm arteriovenous graft with poor flow rates   POST-OPERATIVE DIAGNOSIS: same as above   SURGEON: Adele Barthel, MD  ANESTHESIA: local  ESTIMATED BLOOD LOSS: 50 cc  FINDING(S): 1. Looped left upper arm arteriovenous graft with venous anastomotic stenosis: >75% (2 mm lumen) improved to <50% residual (4-5 mm residual lumen) 2. 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 female who presents with left upper arm arteriovenous graft with poor flow rates.  The patient is scheduled for left arm shuntogram and possible intervention.  The patient is aware of that the risks of an angiographic procedure include but are not limited to: bleeding, infection, access site complications, thrombosis of access, renal failure, embolization, rupture of vessel, dissection, arteriovenous fistula, possible need for emergent surgical intervention, possible need for surgical procedures to treat the patient's pathology, anaphylactic reaction to contrast, and stroke and death.  The patient is aware of the risks of the procedure and elects to proceed forward.   DESCRIPTION: After full informed written consent was obtained, the patient was brought back to the angiography suite and placed supine upon the angiography table.  The patient was connected to monitoring equipment.  The left upper arm was prepped and draped in the standard fashion for a percutaneous access intervention.  Under ultrasound guidance, the left upper arm arteriovenous graft was cannulated with a micropuncture needle.  The microwire  was advanced into the fistula and the needle was exchanged for the a microsheath, which was lodged 2 cm into the access.  The wire was removed and the sheath was connected to the IV extension tubing.  Hand injections were completed to image the access from the cannulation site up the right atrium.  Based on the images, this patient will need: venoplasty of axillary vein and innominate vein.  From the images, the arterial limb of a upper arm looped arteriovenous graft had been cannulated, so I attempted to flip the sheath into the venous limb of the graft.  This maneuver was unsuccessful, so I pulled the sheath and wire and held pressure to the puncture site for 5 minutes.  A pressure dressing was also applied.    Under ultrasound guidance, the venous arm of the left upper arm arteriovenous graft was cannulated with a micropuncture needle.  The microwire was advanced into the fistula and the needle was exchanged for the a microsheath, which was lodged 2 cm into the access.    The patient was given 3000 units of Heparin intravenously to obtain some anticoagulation.  A Bentson wire was advanced into the innominate vein and the sheath was exchanged for a short 6-Fr sheath.    Based on the imaging, a 6 mm x 80 mm angioplasty balloon was selected.  The balloon was centered around the axillary stenosis and inflated to 10 ATM for 2 minutes.  I could visualize a small waist on this inflation.  I then advanced the balloon into the left innominate vein.  The balloon was inflated to 10 ATM for 1  minute.  There was minimal waist on this inflation.  On completion imaging, a <50% residual stenosis was present.    At this point, the balloon was exchanged for a 8 mm x 60 mm angioplasty balloon.  The balloon was centered around the axillary stenosis and inflated to 10 ATM for 2 minutes.  I deflated the balloon and advanced it into the left innominate vein.  The balloon was inflated to 18 ATM for 2 minutes.  The balloon was  deflated and removed.  On completion imaging, there spasm at the axillary angioplasty site without dissection but the innominate stenosis appeared <30%.  There was a greatly improved thrill, so I felt further intervention on the venous anastomosis was not indicated.  Based on the completion imaging, no further intervention is necessary.  The wire and balloon were removed from the sheath.  A 4-0 Monocryl purse-string suture was sewn around the sheath.  The sheath was removed while tying down the suture.  A sterile bandage was applied to the puncture site.   COMPLICATIONS: none  CONDITION: stable   Adele Barthel, MD, [facility] [place] Office: [phone] Pager: [phone]  09/02/2017 8:36 AM

## 2017-09-02 NOTE — Discharge Instructions (Signed)

## 2017-09-02 NOTE — Interval H&P Note (Signed)
   History and Physical Update  The patient was interviewed and re-examined.  The patient's previous History and Physical has been reviewed and is unchanged from Dr. Stephens Shire consult.  There is no change in the plan of care: L arm shuntogram, possible intervention.   Risk, benefits, and alternatives to access surgery were discussed.    The patient is aware the risks include but are not limited to: bleeding, infection, steal syndrome, nerve damage, ischemic monomelic neuropathy, thrombosis, failure to mature, complications related to venous hypertension, need for additional procedures, death and stroke.    The patient agrees to proceed forward with the procedure.   Adele Barthel, MD, FACS [facility] [place] Office: [phone] Pager: [phone]  09/02/2017, 6:53 AM

## 2017-09-03 DIAGNOSIS — D631 Anemia in chronic kidney disease: Secondary | ICD-10-CM | POA: Diagnosis not present

## 2017-09-03 DIAGNOSIS — N2581 Secondary hyperparathyroidism of renal origin: Secondary | ICD-10-CM | POA: Diagnosis not present

## 2017-09-03 DIAGNOSIS — N186 End stage renal disease: Secondary | ICD-10-CM | POA: Diagnosis not present

## 2017-09-03 DIAGNOSIS — E1129 Type 2 diabetes mellitus with other diabetic kidney complication: Secondary | ICD-10-CM | POA: Diagnosis not present

## 2017-09-06 DIAGNOSIS — N186 End stage renal disease: Secondary | ICD-10-CM | POA: Diagnosis not present

## 2017-09-06 DIAGNOSIS — E1129 Type 2 diabetes mellitus with other diabetic kidney complication: Secondary | ICD-10-CM | POA: Diagnosis not present

## 2017-09-06 DIAGNOSIS — D631 Anemia in chronic kidney disease: Secondary | ICD-10-CM | POA: Diagnosis not present

## 2017-09-06 DIAGNOSIS — N2581 Secondary hyperparathyroidism of renal origin: Secondary | ICD-10-CM | POA: Diagnosis not present

## 2017-09-07 ENCOUNTER — Ambulatory Visit (INDEPENDENT_AMBULATORY_CARE_PROVIDER_SITE_OTHER): Payer: Medicare Other | Admitting: Adult Health

## 2017-09-07 ENCOUNTER — Ambulatory Visit (INDEPENDENT_AMBULATORY_CARE_PROVIDER_SITE_OTHER): Payer: Medicare Other | Admitting: Pulmonary Disease

## 2017-09-07 ENCOUNTER — Encounter: Payer: Self-pay | Admitting: Adult Health

## 2017-09-07 DIAGNOSIS — J4520 Mild intermittent asthma, uncomplicated: Secondary | ICD-10-CM | POA: Diagnosis not present

## 2017-09-07 DIAGNOSIS — R05 Cough: Secondary | ICD-10-CM

## 2017-09-07 DIAGNOSIS — R0602 Shortness of breath: Secondary | ICD-10-CM

## 2017-09-07 DIAGNOSIS — G4733 Obstructive sleep apnea (adult) (pediatric): Secondary | ICD-10-CM | POA: Insufficient documentation

## 2017-09-07 DIAGNOSIS — R059 Cough, unspecified: Secondary | ICD-10-CM

## 2017-09-07 LAB — PULMONARY FUNCTION TEST
DL/VA % pred: 102 %
DL/VA: 4.56 ml/min/mmHg/L
DLCO cor % pred: 78 %
DLCO cor: 16.44 ml/min/mmHg
DLCO unc % pred: 73 %
DLCO unc: 15.39 ml/min/mmHg
FEF 25-75 Post: 3.66 L/sec
FEF 25-75 Pre: 3.14 L/sec
FEF2575-%Change-Post: 16 %
FEF2575-%Pred-Post: 194 %
FEF2575-%Pred-Pre: 166 %
FEV1-%Change-Post: 4 %
FEV1-%Pred-Post: 113 %
FEV1-%Pred-Pre: 109 %
FEV1-Post: 2.12 L
FEV1-Pre: 2.03 L
FEV1FVC-%Change-Post: 0 %
FEV1FVC-%Pred-Pre: 113 %
FEV6-%Change-Post: 3 %
FEV6-%Pred-Post: 101 %
FEV6-%Pred-Pre: 98 %
FEV6-Post: 2.32 L
FEV6-Pre: 2.25 L
FEV6FVC-%Change-Post: 0 %
FEV6FVC-%Pred-Post: 103 %
FEV6FVC-%Pred-Pre: 104 %
FVC-%Change-Post: 3 %
FVC-%Pred-Post: 98 %
FVC-%Pred-Pre: 95 %
FVC-Post: 2.33 L
FVC-Pre: 2.25 L
Post FEV1/FVC ratio: 91 %
Post FEV6/FVC ratio: 100 %
Pre FEV1/FVC ratio: 90 %
Pre FEV6/FVC Ratio: 100 %
RV % pred: 85 %
RV: 1.59 L
TLC % pred: 86 %
TLC: 4.03 L

## 2017-09-07 MED ORDER — BUDESONIDE-FORMOTEROL FUMARATE 160-4.5 MCG/ACT IN AERO: 2.0000 | INHALATION_SPRAY | Freq: Two times a day (BID) | RESPIRATORY_TRACT | 5 refills | Status: DC

## 2017-09-07 NOTE — Assessment & Plan Note (Signed)
Improved control on Symbicort  PFT shows normal lung function  Continue to control for triggers of AR/GERD   Plan  Patient Instructions  Continue on Symbicort 2 puffs Twice daily  , rinse after use.  Delsym 2 tsp Twice daily  As needed  Cough .  Flonase 2 puffs daily  Chlor tab 4mg . As needed  Drainage .  Continue on CPAP At bedtime   Follow up with Dr. Vaughan Browner in 4 months and As needed

## 2017-09-07 NOTE — Progress Notes (Signed)
PFT completed today. 09/07/17

## 2017-09-07 NOTE — Assessment & Plan Note (Signed)
Cont on CPAP At bedtime  

## 2017-09-07 NOTE — Progress Notes (Signed)
@Patient  ID: Rachael George, female    DOB: 09-10-1955, 62 y.o.   MRN: [id number]  No chief complaint on file.   Referring provider: Charlott Rakes, MD  HPI: 62 yo female with ESRD on HD , RA and asthma seen in 04/2017 for pulmonary consult for cough and dyspnea.   09/07/2017 Follow up : Asthma, PFT and OSA  Pt presents for a 3 month  follow up . She was seen last ov for pulmonary consul for ongoing cough and dyspnea. Symptoms were worse at night . She was started on Symbicort , tx with Protonix and chlortrimeton .  Since last visit she is feeling much better. Cough has decreased. Still has some cough.  PFT today is normal , FEV1 113%, ratio 91, FVC 98%, no significant bronchodilator response.  DLCO 73%  Has OSA on CPAP At bedtime  . Doing well without any issues.     Allergies  Allergen Reactions  . Atacand [Candesartan] Anaphylaxis and Other (See Comments)    Swelling of the mouth and tongue.  Marland Kitchen Lisinopril Anaphylaxis and Other (See Comments)    Swelling of the mouth and tongue.  . Nsaids Anaphylaxis and Other (See Comments)    Swelling of the mouth and tongue.  Marland Kitchen Penicillins Rash and Other (See Comments)    Has patient had a PCN reaction causing immediate rash, facial/tongue/throat swelling, SOB or lightheadedness with hypotension: No Has patient had a PCN reaction causing severe rash involving mucus membranes or skin necrosis: No Has patient had a PCN reaction that required hospitalization No Has patient had a PCN reaction occurring within the last 10 years: No If all of the above answers are "NO", then may proceed with Cephalosporin use.     Immunization History  Administered Date(s) Administered  . Influenza Split 11/22/2014  . Influenza, Seasonal, Injecte, Preservative Fre 11/24/2016  . Influenza,inj,Quad PF,6+ Mos 12/07/2012, 12/25/2013  . PPD Test 03/13/2015  . Pneumococcal Polysaccharide-23 12/25/2013  . Tdap 07/21/2012    Past Medical History:  Diagnosis  Date  . Anemia   . Asthma   . ESRD (end stage renal disease) ([place])    MWF  . GERD (gastroesophageal reflux disease)   . Gout   . 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 Heavy menstrual bleeding   . High cholesterol   . History of blood transfusion 02/2016- last one   1990's- several ones   . History of kidney stones 1980's  . Hyperlipemia 12/07/2012  . Hypertension   . Hypothyroidism   . Kidney stones   . Pre-diabetes   . Prediabetes   . RA (rheumatoid arthritis) ([place])   . Sleep apnea    [place]- have CPAP, not able to use every night     Tobacco History: Social History   Tobacco Use  Smoking Status Never Smoker  Smokeless Tobacco Never Used   Counseling given: Not Answered   Outpatient Encounter Medications as of 09/07/2017  Medication Sig  . acetaminophen (TYLENOL) 500 MG tablet Take 1,000 mg by mouth every 6 (six) hours as needed for mild pain.   Marland Kitchen albuterol (PROVENTIL) (2.5 MG/3ML) 0.083% nebulizer solution Take 3 mLs (2.5 mg total) by nebulization every 6 (six) hours as needed for wheezing or shortness of breath. ICD 10:J45.20  . allopurinol (ZYLOPRIM) 100 MG tablet Take 100 mg by mouth 2 (two) times daily.   Marland Kitchen amLODipine (NORVASC) 10 MG tablet Take 10 mg by mouth at bedtime.   Marland Kitchen  atorvastatin (LIPITOR) 40 MG tablet Take 1 tablet (40 mg total) by mouth daily.  . carvedilol (COREG) 6.25 MG tablet Take 1 tablet (6.25 mg total) by mouth 2 (two) times daily with a meal.  . chlorpheniramine (CHLOR-TRIMETON) 4 MG tablet Take 1 tablet (4 mg total) by mouth 3 (three) times daily.  . cinacalcet (SENSIPAR) 60 MG tablet Take 120 mg by mouth daily.   . cyclobenzaprine (FLEXERIL) 10 MG tablet Take 1 tablet (10 mg total) by mouth 2 (two) times daily as needed. For back pain (Patient taking differently: Take 10 mg by mouth 3 (three) times daily as needed (for back pain). )  . famotidine (PEPCID AC MAXIMUM STRENGTH) 20 MG tablet Take 20 mg by mouth 2 (two) times  daily.  . fluticasone (FLONASE) 50 MCG/ACT nasal spray Place 2 sprays into both nostrils daily.  Marland Kitchen gabapentin (NEURONTIN) 300 MG capsule Take 1 capsule (300 mg total) by mouth 2 (two) times daily. (Patient taking differently: Take 300 mg by mouth 2 (two) times daily as needed (for nerve pain). )  . meclizine (ANTIVERT) 25 MG tablet Take 1 tablet (25 mg total) by mouth 2 (two) times daily as needed for dizziness.  . multivitamin (RENA-VIT) TABS tablet Take 1 tablet by mouth daily.  . pantoprazole (PROTONIX) 40 MG tablet Take 40 mg by mouth 2 (two) times daily.  . sucroferric oxyhydroxide (VELPHORO) 500 MG chewable tablet Chew 1,000-1,500 mg by mouth See admin instructions. Take 3 tablets (15oomg) three times daily with meals, and 2 tablets (1000mg ) with snacks  . triamcinolone cream (KENALOG) 0.1 % Apply 1 application topically 2 (two) times daily. Applied to eczematous lesions (Patient taking differently: Apply 1 application topically 2 (two) times daily as needed (for eczema). )  . VENTOLIN HFA 108 (90 Base) MCG/ACT inhaler INHALE 2 PUFFS INTO THE LUNGS EVERY 6 HOURS AS NEEDED FOR WHEEZING ORSHORTNESS OF BREATH  . budesonide-formoterol (SYMBICORT) 160-4.5 MCG/ACT inhaler Inhale 2 puffs into the lungs 2 (two) times daily for 1 day.  . [DISCONTINUED] budesonide-formoterol (SYMBICORT) 160-4.5 MCG/ACT inhaler Inhale 2 puffs into the lungs 2 (two) times daily for 1 day.   No facility-administered encounter medications on file as of 09/07/2017.      Review of Systems  Constitutional:   No  weight loss, night sweats,  Fevers, chills,  +fatigue, or  lassitude.  HEENT:   No headaches,  Difficulty swallowing,  Tooth/dental problems, or  Sore throat,                No sneezing, itching, ear ache, nasal congestion, post nasal drip,   CV:  No chest pain,  Orthopnea, PND, swelling in lower extremities, anasarca, dizziness, palpitations, syncope.   GI  No heartburn, indigestion, abdominal pain, nausea,  vomiting, diarrhea, change in bowel habits, loss of appetite, bloody stools.   Resp: .  No wheezing.  No chest wall deformity  Skin: no rash or lesions.  GU: no dysuria, change in color of urine, no urgency or frequency.  No flank pain, no hematuria   MS:  No joint pain or swelling.  No decreased range of motion.  No back pain.    Physical Exam  BP 140/76 (BP Location: Right Arm, Cuff Size: Normal)   Pulse 88   Ht 5' 1.5" (1.562 m)   Wt 178 lb 3.2 oz (80.8 kg)   SpO2 99%   BMI 33.13 kg/m   GEN: A/Ox3; pleasant , NAD, obese    HEENT:  [place]/AT,  EACs-clear, TMs-wnl, NOSE-clear, THROAT-clear, no lesions, no postnasal drip or exudate noted.   NECK:  Supple w/ fair ROM; no JVD; normal carotid impulses w/o bruits; no thyromegaly or nodules palpated; no lymphadenopathy.    RESP  Clear  P & A; w/o, wheezes/ rales/ or rhonchi. no accessory muscle use, no dullness to percussion  CARD:  RRR, no m/r/g, no peripheral edema, pulses intact, no cyanosis or clubbing.  GI:   Soft & nt; nml bowel sounds; no organomegaly or masses detected.   Musco: Warm bil, no deformities or joint swelling noted.   Neuro: alert, no focal deficits noted.    Skin: Warm, no lesions or rashes    Lab Results:  CBC    Component Value Date/Time   WBC 8.8 08/14/2017 0811   RBC 4.55 08/14/2017 0811   HGB 10.5 (L) 09/02/2017 0609   HCT 31.0 (L) 09/02/2017 0609   PLT 139 (L) 08/14/2017 0811   MCV 79.8 08/14/2017 0811   MCH 25.3 (L) 08/14/2017 0811   MCHC 31.7 08/14/2017 0811   RDW 18.4 (H) 08/14/2017 0811   LYMPHSABS 2.3 01/21/2017 1142   MONOABS 0.7 01/21/2017 1142   EOSABS 0.2 01/21/2017 1142   BASOSABS 0.0 01/21/2017 1142    BMET    Component Value Date/Time   NA 138 09/02/2017 0609   K 3.5 09/02/2017 0609   CL 97 (L) 09/02/2017 0609   CO2 28 08/14/2017 0811   GLUCOSE 106 (H) 09/02/2017 0609   BUN 31 (H) 09/02/2017 0609   CREATININE 6.20 (H) 09/02/2017 0609   CREATININE 3.71 (H) 10/04/2014  1016   CALCIUM 9.4 08/14/2017 0811   GFRNONAA 6 (L) 08/14/2017 0811   GFRNONAA 13 (L) 10/04/2014 1016   GFRAA 7 (L) 08/14/2017 0811   GFRAA 15 (L) 10/04/2014 1016    BNP No results found for: BNP  ProBNP No results found for: PROBNP  Imaging: Mr Brain Wo Contrast  Result Date: 08/14/2017 CLINICAL DATA:  Ataxia, stroke suspected. Dizziness, nausea and vomiting, and bleeding in the ear after removing headphones. Dialysis patient. EXAM: MRI HEAD WITHOUT CONTRAST TECHNIQUE: Multiplanar, multiecho pulse sequences of the brain and surrounding structures were obtained without intravenous contrast. COMPARISON:  None. FINDINGS: Brain: No evidence for acute infarction, hemorrhage, mass lesion, hydrocephalus, or extra-axial fluid. Normal cerebral volume. No significant white matter disease. Prominent perivascular spaces. Vascular: Flow voids are maintained in the carotid, basilar, and RIGHT vertebral arteries. LEFT vertebral is diminutive, likely ending in PICA. Skull and upper cervical spine: Normal marrow signal. Sinuses/Orbits: Negative orbits and sinuses. Other: The middle ear and mastoid regions are clear. Scalp soft tissues unremarkable. IMPRESSION: Unremarkable MRI brain.  No acute stroke is evident. Electronically Signed   By: Staci Righter M.D.   On: 08/14/2017 12:27     Assessment & Plan:   Asthma, mild intermittent Improved control on Symbicort  PFT shows normal lung function  Continue to control for triggers of AR/GERD   Plan  Patient Instructions  Continue on Symbicort 2 puffs Twice daily  , rinse after use.  Delsym 2 tsp Twice daily  As needed  Cough .  Flonase 2 puffs daily  Chlor tab 4mg . As needed  Drainage .  Continue on CPAP At bedtime   Follow up with Dr. Vaughan Browner in 4 months and As needed        Cough Improved on Symbicort . Continue to control for triggers  Plan  Patient Instructions  Continue on Symbicort 2 puffs Twice daily  ,  rinse after use.  Delsym 2 tsp  Twice daily  As needed  Cough .  Flonase 2 puffs daily  Chlor tab 4mg . As needed  Drainage .  Continue on CPAP At bedtime   Follow up with Dr. Vaughan Browner in 4 months and As needed        OSA (obstructive sleep apnea) Cont on CPAP At bedtime       Rexene Edison, NP 09/07/2017

## 2017-09-07 NOTE — Assessment & Plan Note (Signed)
Improved on Symbicort . Continue to control for triggers  Plan  Patient Instructions  Continue on Symbicort 2 puffs Twice daily  , rinse after use.  Delsym 2 tsp Twice daily  As needed  Cough .  Flonase 2 puffs daily  Chlor tab 4mg . As needed  Drainage .  Continue on CPAP At bedtime   Follow up with Dr. Vaughan Browner in 4 months and As needed

## 2017-09-07 NOTE — Patient Instructions (Signed)
Continue on Symbicort 2 puffs Twice daily  , rinse after use.  Delsym 2 tsp Twice daily  As needed  Cough .  Flonase 2 puffs daily  Chlor tab 4mg . As needed  Drainage .  Continue on CPAP At bedtime   Follow up with Dr. Vaughan Browner in 4 months and As needed

## 2017-09-08 ENCOUNTER — Other Ambulatory Visit: Payer: Self-pay | Admitting: Family Medicine

## 2017-09-08 DIAGNOSIS — M545 Low back pain: Principal | ICD-10-CM

## 2017-09-08 DIAGNOSIS — N2581 Secondary hyperparathyroidism of renal origin: Secondary | ICD-10-CM | POA: Diagnosis not present

## 2017-09-08 DIAGNOSIS — G8929 Other chronic pain: Secondary | ICD-10-CM

## 2017-09-08 DIAGNOSIS — E1129 Type 2 diabetes mellitus with other diabetic kidney complication: Secondary | ICD-10-CM | POA: Diagnosis not present

## 2017-09-08 DIAGNOSIS — N186 End stage renal disease: Secondary | ICD-10-CM | POA: Diagnosis not present

## 2017-09-08 DIAGNOSIS — D631 Anemia in chronic kidney disease: Secondary | ICD-10-CM | POA: Diagnosis not present

## 2017-09-10 DIAGNOSIS — N2581 Secondary hyperparathyroidism of renal origin: Secondary | ICD-10-CM | POA: Diagnosis not present

## 2017-09-10 DIAGNOSIS — N186 End stage renal disease: Secondary | ICD-10-CM | POA: Diagnosis not present

## 2017-09-10 DIAGNOSIS — E1129 Type 2 diabetes mellitus with other diabetic kidney complication: Secondary | ICD-10-CM | POA: Diagnosis not present

## 2017-09-10 DIAGNOSIS — D631 Anemia in chronic kidney disease: Secondary | ICD-10-CM | POA: Diagnosis not present

## 2017-09-13 DIAGNOSIS — D631 Anemia in chronic kidney disease: Secondary | ICD-10-CM | POA: Diagnosis not present

## 2017-09-13 DIAGNOSIS — E1129 Type 2 diabetes mellitus with other diabetic kidney complication: Secondary | ICD-10-CM | POA: Diagnosis not present

## 2017-09-13 DIAGNOSIS — N186 End stage renal disease: Secondary | ICD-10-CM | POA: Diagnosis not present

## 2017-09-13 DIAGNOSIS — N2581 Secondary hyperparathyroidism of renal origin: Secondary | ICD-10-CM | POA: Diagnosis not present

## 2017-09-15 DIAGNOSIS — N2581 Secondary hyperparathyroidism of renal origin: Secondary | ICD-10-CM | POA: Diagnosis not present

## 2017-09-15 DIAGNOSIS — D631 Anemia in chronic kidney disease: Secondary | ICD-10-CM | POA: Diagnosis not present

## 2017-09-15 DIAGNOSIS — E1129 Type 2 diabetes mellitus with other diabetic kidney complication: Secondary | ICD-10-CM | POA: Diagnosis not present

## 2017-09-15 DIAGNOSIS — N186 End stage renal disease: Secondary | ICD-10-CM | POA: Diagnosis not present

## 2017-09-17 DIAGNOSIS — D631 Anemia in chronic kidney disease: Secondary | ICD-10-CM | POA: Diagnosis not present

## 2017-09-17 DIAGNOSIS — E1129 Type 2 diabetes mellitus with other diabetic kidney complication: Secondary | ICD-10-CM | POA: Diagnosis not present

## 2017-09-17 DIAGNOSIS — N2581 Secondary hyperparathyroidism of renal origin: Secondary | ICD-10-CM | POA: Diagnosis not present

## 2017-09-17 DIAGNOSIS — N186 End stage renal disease: Secondary | ICD-10-CM | POA: Diagnosis not present

## 2017-09-20 DIAGNOSIS — I129 Hypertensive chronic kidney disease with stage 1 through stage 4 chronic kidney disease, or unspecified chronic kidney disease: Secondary | ICD-10-CM | POA: Diagnosis not present

## 2017-09-20 DIAGNOSIS — Z992 Dependence on renal dialysis: Secondary | ICD-10-CM | POA: Diagnosis not present

## 2017-09-20 DIAGNOSIS — N2581 Secondary hyperparathyroidism of renal origin: Secondary | ICD-10-CM | POA: Diagnosis not present

## 2017-09-20 DIAGNOSIS — D631 Anemia in chronic kidney disease: Secondary | ICD-10-CM | POA: Diagnosis not present

## 2017-09-20 DIAGNOSIS — E1129 Type 2 diabetes mellitus with other diabetic kidney complication: Secondary | ICD-10-CM | POA: Diagnosis not present

## 2017-09-20 DIAGNOSIS — N186 End stage renal disease: Secondary | ICD-10-CM | POA: Diagnosis not present

## 2017-09-22 DIAGNOSIS — N186 End stage renal disease: Secondary | ICD-10-CM | POA: Diagnosis not present

## 2017-09-22 DIAGNOSIS — N2581 Secondary hyperparathyroidism of renal origin: Secondary | ICD-10-CM | POA: Diagnosis not present

## 2017-09-22 DIAGNOSIS — E1129 Type 2 diabetes mellitus with other diabetic kidney complication: Secondary | ICD-10-CM | POA: Diagnosis not present

## 2017-09-22 DIAGNOSIS — D631 Anemia in chronic kidney disease: Secondary | ICD-10-CM | POA: Diagnosis not present

## 2017-09-24 DIAGNOSIS — D631 Anemia in chronic kidney disease: Secondary | ICD-10-CM | POA: Diagnosis not present

## 2017-09-24 DIAGNOSIS — N2581 Secondary hyperparathyroidism of renal origin: Secondary | ICD-10-CM | POA: Diagnosis not present

## 2017-09-24 DIAGNOSIS — E1129 Type 2 diabetes mellitus with other diabetic kidney complication: Secondary | ICD-10-CM | POA: Diagnosis not present

## 2017-09-24 DIAGNOSIS — N186 End stage renal disease: Secondary | ICD-10-CM | POA: Diagnosis not present

## 2017-09-27 DIAGNOSIS — N2581 Secondary hyperparathyroidism of renal origin: Secondary | ICD-10-CM | POA: Diagnosis not present

## 2017-09-27 DIAGNOSIS — N186 End stage renal disease: Secondary | ICD-10-CM | POA: Diagnosis not present

## 2017-09-27 DIAGNOSIS — E1129 Type 2 diabetes mellitus with other diabetic kidney complication: Secondary | ICD-10-CM | POA: Diagnosis not present

## 2017-09-27 DIAGNOSIS — D631 Anemia in chronic kidney disease: Secondary | ICD-10-CM | POA: Diagnosis not present

## 2017-09-29 DIAGNOSIS — N2581 Secondary hyperparathyroidism of renal origin: Secondary | ICD-10-CM | POA: Diagnosis not present

## 2017-09-29 DIAGNOSIS — E1129 Type 2 diabetes mellitus with other diabetic kidney complication: Secondary | ICD-10-CM | POA: Diagnosis not present

## 2017-09-29 DIAGNOSIS — N186 End stage renal disease: Secondary | ICD-10-CM | POA: Diagnosis not present

## 2017-09-29 DIAGNOSIS — D631 Anemia in chronic kidney disease: Secondary | ICD-10-CM | POA: Diagnosis not present

## 2017-09-30 IMAGING — CR DG CHEST 1V PORT
1 series · 1 of 1 positions shown · non-contrast
Comparison: 02/21/2016.

CLINICAL DATA: Dialysis catheter insertion.

EXAM:
PORTABLE CHEST 1 VIEW

[portable]
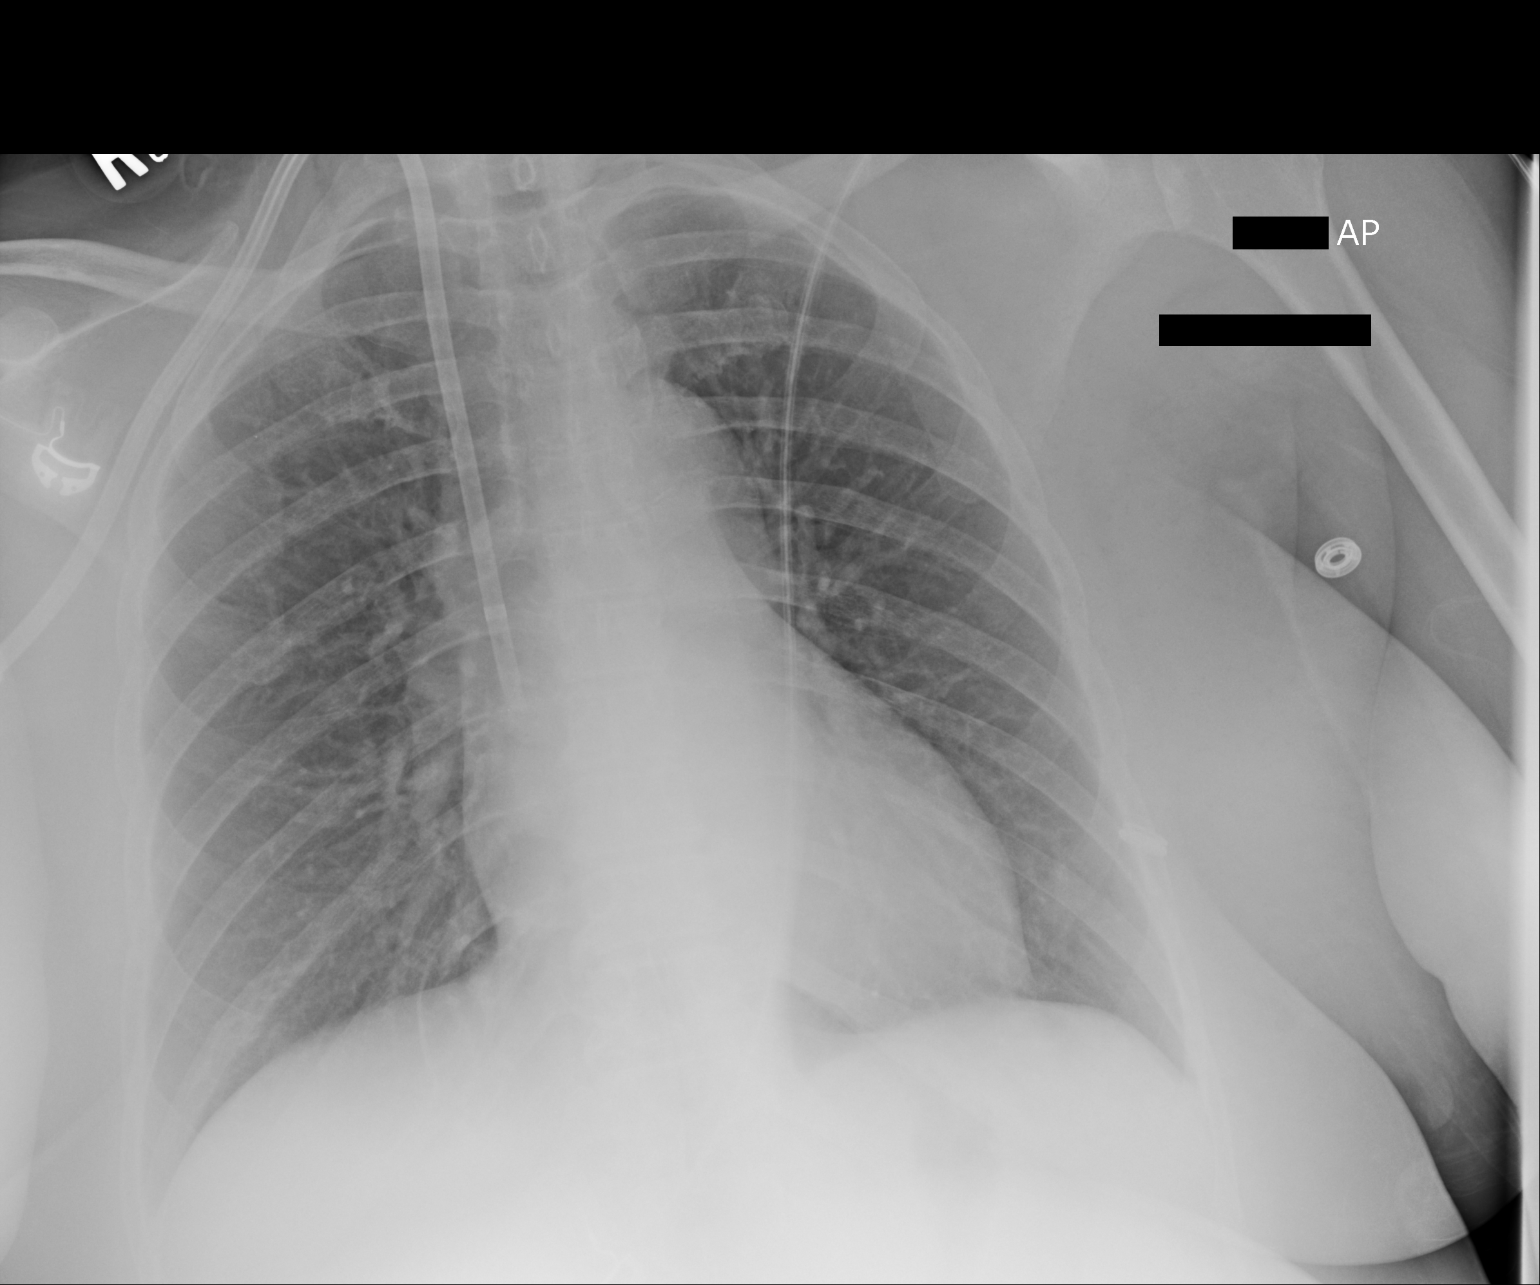

[1 of 1 positions shown; findings below may reference images not displayed]

FINDINGS: Right IJ dialysis catheter noted with its tip over the superior vena
cava. Stable cardiomegaly. Mild pulmonary venous congestion. No
focal infiltrate. No pleural effusion or pneumothorax .
IMPRESSION: 1. Right IJ dialysis catheter noted with its tip over the superior
vena cava.

2.  Mild cardiomegaly with mild pulmonary venous congestion.

## 2017-10-01 DIAGNOSIS — E1129 Type 2 diabetes mellitus with other diabetic kidney complication: Secondary | ICD-10-CM | POA: Diagnosis not present

## 2017-10-01 DIAGNOSIS — N2581 Secondary hyperparathyroidism of renal origin: Secondary | ICD-10-CM | POA: Diagnosis not present

## 2017-10-01 DIAGNOSIS — N186 End stage renal disease: Secondary | ICD-10-CM | POA: Diagnosis not present

## 2017-10-01 DIAGNOSIS — D631 Anemia in chronic kidney disease: Secondary | ICD-10-CM | POA: Diagnosis not present

## 2017-10-06 DIAGNOSIS — N2581 Secondary hyperparathyroidism of renal origin: Secondary | ICD-10-CM | POA: Diagnosis not present

## 2017-10-06 DIAGNOSIS — D631 Anemia in chronic kidney disease: Secondary | ICD-10-CM | POA: Diagnosis not present

## 2017-10-06 DIAGNOSIS — E1129 Type 2 diabetes mellitus with other diabetic kidney complication: Secondary | ICD-10-CM | POA: Diagnosis not present

## 2017-10-06 DIAGNOSIS — N186 End stage renal disease: Secondary | ICD-10-CM | POA: Diagnosis not present

## 2017-10-08 DIAGNOSIS — D631 Anemia in chronic kidney disease: Secondary | ICD-10-CM | POA: Diagnosis not present

## 2017-10-08 DIAGNOSIS — N186 End stage renal disease: Secondary | ICD-10-CM | POA: Diagnosis not present

## 2017-10-08 DIAGNOSIS — N2581 Secondary hyperparathyroidism of renal origin: Secondary | ICD-10-CM | POA: Diagnosis not present

## 2017-10-08 DIAGNOSIS — E1129 Type 2 diabetes mellitus with other diabetic kidney complication: Secondary | ICD-10-CM | POA: Diagnosis not present

## 2017-10-11 DIAGNOSIS — N2581 Secondary hyperparathyroidism of renal origin: Secondary | ICD-10-CM | POA: Diagnosis not present

## 2017-10-11 DIAGNOSIS — E1129 Type 2 diabetes mellitus with other diabetic kidney complication: Secondary | ICD-10-CM | POA: Diagnosis not present

## 2017-10-11 DIAGNOSIS — D631 Anemia in chronic kidney disease: Secondary | ICD-10-CM | POA: Diagnosis not present

## 2017-10-11 DIAGNOSIS — N186 End stage renal disease: Secondary | ICD-10-CM | POA: Diagnosis not present

## 2017-10-13 DIAGNOSIS — N186 End stage renal disease: Secondary | ICD-10-CM | POA: Diagnosis not present

## 2017-10-13 DIAGNOSIS — N2581 Secondary hyperparathyroidism of renal origin: Secondary | ICD-10-CM | POA: Diagnosis not present

## 2017-10-13 DIAGNOSIS — E1129 Type 2 diabetes mellitus with other diabetic kidney complication: Secondary | ICD-10-CM | POA: Diagnosis not present

## 2017-10-13 DIAGNOSIS — D631 Anemia in chronic kidney disease: Secondary | ICD-10-CM | POA: Diagnosis not present

## 2017-10-15 DIAGNOSIS — D631 Anemia in chronic kidney disease: Secondary | ICD-10-CM | POA: Diagnosis not present

## 2017-10-15 DIAGNOSIS — N186 End stage renal disease: Secondary | ICD-10-CM | POA: Diagnosis not present

## 2017-10-15 DIAGNOSIS — N2581 Secondary hyperparathyroidism of renal origin: Secondary | ICD-10-CM | POA: Diagnosis not present

## 2017-10-15 DIAGNOSIS — E1129 Type 2 diabetes mellitus with other diabetic kidney complication: Secondary | ICD-10-CM | POA: Diagnosis not present

## 2017-10-18 DIAGNOSIS — N186 End stage renal disease: Secondary | ICD-10-CM | POA: Diagnosis not present

## 2017-10-18 DIAGNOSIS — E1129 Type 2 diabetes mellitus with other diabetic kidney complication: Secondary | ICD-10-CM | POA: Diagnosis not present

## 2017-10-18 DIAGNOSIS — N2581 Secondary hyperparathyroidism of renal origin: Secondary | ICD-10-CM | POA: Diagnosis not present

## 2017-10-18 DIAGNOSIS — D631 Anemia in chronic kidney disease: Secondary | ICD-10-CM | POA: Diagnosis not present

## 2017-10-20 DIAGNOSIS — E1129 Type 2 diabetes mellitus with other diabetic kidney complication: Secondary | ICD-10-CM | POA: Diagnosis not present

## 2017-10-20 DIAGNOSIS — N186 End stage renal disease: Secondary | ICD-10-CM | POA: Diagnosis not present

## 2017-10-20 DIAGNOSIS — D631 Anemia in chronic kidney disease: Secondary | ICD-10-CM | POA: Diagnosis not present

## 2017-10-20 DIAGNOSIS — N2581 Secondary hyperparathyroidism of renal origin: Secondary | ICD-10-CM | POA: Diagnosis not present

## 2017-10-21 DIAGNOSIS — N186 End stage renal disease: Secondary | ICD-10-CM | POA: Diagnosis not present

## 2017-10-21 DIAGNOSIS — I129 Hypertensive chronic kidney disease with stage 1 through stage 4 chronic kidney disease, or unspecified chronic kidney disease: Secondary | ICD-10-CM | POA: Diagnosis not present

## 2017-10-21 DIAGNOSIS — Z992 Dependence on renal dialysis: Secondary | ICD-10-CM | POA: Diagnosis not present

## 2017-10-22 DIAGNOSIS — N2581 Secondary hyperparathyroidism of renal origin: Secondary | ICD-10-CM | POA: Diagnosis not present

## 2017-10-22 DIAGNOSIS — N186 End stage renal disease: Secondary | ICD-10-CM | POA: Diagnosis not present

## 2017-10-22 DIAGNOSIS — D631 Anemia in chronic kidney disease: Secondary | ICD-10-CM | POA: Diagnosis not present

## 2017-10-22 DIAGNOSIS — E1129 Type 2 diabetes mellitus with other diabetic kidney complication: Secondary | ICD-10-CM | POA: Diagnosis not present

## 2017-10-25 DIAGNOSIS — D631 Anemia in chronic kidney disease: Secondary | ICD-10-CM | POA: Diagnosis not present

## 2017-10-25 DIAGNOSIS — N186 End stage renal disease: Secondary | ICD-10-CM | POA: Diagnosis not present

## 2017-10-25 DIAGNOSIS — N2581 Secondary hyperparathyroidism of renal origin: Secondary | ICD-10-CM | POA: Diagnosis not present

## 2017-10-25 DIAGNOSIS — E1129 Type 2 diabetes mellitus with other diabetic kidney complication: Secondary | ICD-10-CM | POA: Diagnosis not present

## 2017-10-27 DIAGNOSIS — N186 End stage renal disease: Secondary | ICD-10-CM | POA: Diagnosis not present

## 2017-10-27 DIAGNOSIS — D631 Anemia in chronic kidney disease: Secondary | ICD-10-CM | POA: Diagnosis not present

## 2017-10-27 DIAGNOSIS — N2581 Secondary hyperparathyroidism of renal origin: Secondary | ICD-10-CM | POA: Diagnosis not present

## 2017-10-27 DIAGNOSIS — E1129 Type 2 diabetes mellitus with other diabetic kidney complication: Secondary | ICD-10-CM | POA: Diagnosis not present

## 2017-10-29 DIAGNOSIS — E1129 Type 2 diabetes mellitus with other diabetic kidney complication: Secondary | ICD-10-CM | POA: Diagnosis not present

## 2017-10-29 DIAGNOSIS — N186 End stage renal disease: Secondary | ICD-10-CM | POA: Diagnosis not present

## 2017-10-29 DIAGNOSIS — N2581 Secondary hyperparathyroidism of renal origin: Secondary | ICD-10-CM | POA: Diagnosis not present

## 2017-10-29 DIAGNOSIS — D631 Anemia in chronic kidney disease: Secondary | ICD-10-CM | POA: Diagnosis not present

## 2017-11-01 DIAGNOSIS — E1129 Type 2 diabetes mellitus with other diabetic kidney complication: Secondary | ICD-10-CM | POA: Diagnosis not present

## 2017-11-01 DIAGNOSIS — N186 End stage renal disease: Secondary | ICD-10-CM | POA: Diagnosis not present

## 2017-11-01 DIAGNOSIS — N2581 Secondary hyperparathyroidism of renal origin: Secondary | ICD-10-CM | POA: Diagnosis not present

## 2017-11-01 DIAGNOSIS — D631 Anemia in chronic kidney disease: Secondary | ICD-10-CM | POA: Diagnosis not present

## 2017-11-03 DIAGNOSIS — N186 End stage renal disease: Secondary | ICD-10-CM | POA: Diagnosis not present

## 2017-11-03 DIAGNOSIS — E1129 Type 2 diabetes mellitus with other diabetic kidney complication: Secondary | ICD-10-CM | POA: Diagnosis not present

## 2017-11-03 DIAGNOSIS — D631 Anemia in chronic kidney disease: Secondary | ICD-10-CM | POA: Diagnosis not present

## 2017-11-03 DIAGNOSIS — N2581 Secondary hyperparathyroidism of renal origin: Secondary | ICD-10-CM | POA: Diagnosis not present

## 2017-11-05 DIAGNOSIS — N186 End stage renal disease: Secondary | ICD-10-CM | POA: Diagnosis not present

## 2017-11-05 DIAGNOSIS — D631 Anemia in chronic kidney disease: Secondary | ICD-10-CM | POA: Diagnosis not present

## 2017-11-05 DIAGNOSIS — N2581 Secondary hyperparathyroidism of renal origin: Secondary | ICD-10-CM | POA: Diagnosis not present

## 2017-11-05 DIAGNOSIS — E1129 Type 2 diabetes mellitus with other diabetic kidney complication: Secondary | ICD-10-CM | POA: Diagnosis not present

## 2017-11-08 DIAGNOSIS — N2581 Secondary hyperparathyroidism of renal origin: Secondary | ICD-10-CM | POA: Diagnosis not present

## 2017-11-08 DIAGNOSIS — D631 Anemia in chronic kidney disease: Secondary | ICD-10-CM | POA: Diagnosis not present

## 2017-11-08 DIAGNOSIS — E1129 Type 2 diabetes mellitus with other diabetic kidney complication: Secondary | ICD-10-CM | POA: Diagnosis not present

## 2017-11-08 DIAGNOSIS — N186 End stage renal disease: Secondary | ICD-10-CM | POA: Diagnosis not present

## 2017-11-10 DIAGNOSIS — N2581 Secondary hyperparathyroidism of renal origin: Secondary | ICD-10-CM | POA: Diagnosis not present

## 2017-11-10 DIAGNOSIS — N186 End stage renal disease: Secondary | ICD-10-CM | POA: Diagnosis not present

## 2017-11-10 DIAGNOSIS — D631 Anemia in chronic kidney disease: Secondary | ICD-10-CM | POA: Diagnosis not present

## 2017-11-10 DIAGNOSIS — E1129 Type 2 diabetes mellitus with other diabetic kidney complication: Secondary | ICD-10-CM | POA: Diagnosis not present

## 2017-11-12 DIAGNOSIS — D631 Anemia in chronic kidney disease: Secondary | ICD-10-CM | POA: Diagnosis not present

## 2017-11-12 DIAGNOSIS — E1129 Type 2 diabetes mellitus with other diabetic kidney complication: Secondary | ICD-10-CM | POA: Diagnosis not present

## 2017-11-12 DIAGNOSIS — N2581 Secondary hyperparathyroidism of renal origin: Secondary | ICD-10-CM | POA: Diagnosis not present

## 2017-11-12 DIAGNOSIS — N186 End stage renal disease: Secondary | ICD-10-CM | POA: Diagnosis not present

## 2017-11-15 DIAGNOSIS — E1129 Type 2 diabetes mellitus with other diabetic kidney complication: Secondary | ICD-10-CM | POA: Diagnosis not present

## 2017-11-15 DIAGNOSIS — N2581 Secondary hyperparathyroidism of renal origin: Secondary | ICD-10-CM | POA: Diagnosis not present

## 2017-11-15 DIAGNOSIS — N186 End stage renal disease: Secondary | ICD-10-CM | POA: Diagnosis not present

## 2017-11-15 DIAGNOSIS — D631 Anemia in chronic kidney disease: Secondary | ICD-10-CM | POA: Diagnosis not present

## 2017-11-17 DIAGNOSIS — N186 End stage renal disease: Secondary | ICD-10-CM | POA: Diagnosis not present

## 2017-11-17 DIAGNOSIS — D631 Anemia in chronic kidney disease: Secondary | ICD-10-CM | POA: Diagnosis not present

## 2017-11-17 DIAGNOSIS — N2581 Secondary hyperparathyroidism of renal origin: Secondary | ICD-10-CM | POA: Diagnosis not present

## 2017-11-17 DIAGNOSIS — E1129 Type 2 diabetes mellitus with other diabetic kidney complication: Secondary | ICD-10-CM | POA: Diagnosis not present

## 2017-11-19 DIAGNOSIS — E1129 Type 2 diabetes mellitus with other diabetic kidney complication: Secondary | ICD-10-CM | POA: Diagnosis not present

## 2017-11-19 DIAGNOSIS — N186 End stage renal disease: Secondary | ICD-10-CM | POA: Diagnosis not present

## 2017-11-19 DIAGNOSIS — N2581 Secondary hyperparathyroidism of renal origin: Secondary | ICD-10-CM | POA: Diagnosis not present

## 2017-11-19 DIAGNOSIS — D631 Anemia in chronic kidney disease: Secondary | ICD-10-CM | POA: Diagnosis not present

## 2017-11-21 DIAGNOSIS — I129 Hypertensive chronic kidney disease with stage 1 through stage 4 chronic kidney disease, or unspecified chronic kidney disease: Secondary | ICD-10-CM | POA: Diagnosis not present

## 2017-11-21 DIAGNOSIS — N186 End stage renal disease: Secondary | ICD-10-CM | POA: Diagnosis not present

## 2017-11-21 DIAGNOSIS — Z992 Dependence on renal dialysis: Secondary | ICD-10-CM | POA: Diagnosis not present

## 2017-11-22 DIAGNOSIS — N2581 Secondary hyperparathyroidism of renal origin: Secondary | ICD-10-CM | POA: Diagnosis not present

## 2017-11-22 DIAGNOSIS — N186 End stage renal disease: Secondary | ICD-10-CM | POA: Diagnosis not present

## 2017-11-22 DIAGNOSIS — D631 Anemia in chronic kidney disease: Secondary | ICD-10-CM | POA: Diagnosis not present

## 2017-11-22 DIAGNOSIS — E1129 Type 2 diabetes mellitus with other diabetic kidney complication: Secondary | ICD-10-CM | POA: Diagnosis not present

## 2017-11-23 ENCOUNTER — Ambulatory Visit: Payer: Medicare Other | Attending: Family Medicine | Admitting: Family Medicine

## 2017-11-23 ENCOUNTER — Encounter: Payer: Self-pay | Admitting: Family Medicine

## 2017-11-23 VITALS — BP 161/95 | HR 79 | Temp 98.1°F | Ht 61.5 in | Wt 176.6 lb

## 2017-11-23 DIAGNOSIS — G8929 Other chronic pain: Secondary | ICD-10-CM | POA: Diagnosis not present

## 2017-11-23 DIAGNOSIS — N186 End stage renal disease: Secondary | ICD-10-CM | POA: Diagnosis not present

## 2017-11-23 DIAGNOSIS — G4730 Sleep apnea, unspecified: Secondary | ICD-10-CM | POA: Diagnosis not present

## 2017-11-23 DIAGNOSIS — M1A09X0 Idiopathic chronic gout, multiple sites, without tophus (tophi): Secondary | ICD-10-CM | POA: Diagnosis not present

## 2017-11-23 DIAGNOSIS — Z87442 Personal history of urinary calculi: Secondary | ICD-10-CM | POA: Diagnosis not present

## 2017-11-23 DIAGNOSIS — Z23 Encounter for immunization: Secondary | ICD-10-CM | POA: Diagnosis not present

## 2017-11-23 DIAGNOSIS — K219 Gastro-esophageal reflux disease without esophagitis: Secondary | ICD-10-CM | POA: Insufficient documentation

## 2017-11-23 DIAGNOSIS — Z886 Allergy status to analgesic agent status: Secondary | ICD-10-CM | POA: Diagnosis not present

## 2017-11-23 DIAGNOSIS — J4520 Mild intermittent asthma, uncomplicated: Secondary | ICD-10-CM | POA: Diagnosis not present

## 2017-11-23 DIAGNOSIS — Z992 Dependence on renal dialysis: Secondary | ICD-10-CM | POA: Diagnosis not present

## 2017-11-23 DIAGNOSIS — Z79899 Other long term (current) drug therapy: Secondary | ICD-10-CM | POA: Diagnosis not present

## 2017-11-23 DIAGNOSIS — E1169 Type 2 diabetes mellitus with other specified complication: Secondary | ICD-10-CM | POA: Insufficient documentation

## 2017-11-23 DIAGNOSIS — E1122 Type 2 diabetes mellitus with diabetic chronic kidney disease: Secondary | ICD-10-CM | POA: Diagnosis not present

## 2017-11-23 DIAGNOSIS — M069 Rheumatoid arthritis, unspecified: Secondary | ICD-10-CM | POA: Diagnosis not present

## 2017-11-23 DIAGNOSIS — I120 Hypertensive chronic kidney disease with stage 5 chronic kidney disease or end stage renal disease: Secondary | ICD-10-CM | POA: Diagnosis not present

## 2017-11-23 DIAGNOSIS — Z888 Allergy status to other drugs, medicaments and biological substances status: Secondary | ICD-10-CM | POA: Diagnosis not present

## 2017-11-23 DIAGNOSIS — M545 Low back pain, unspecified: Secondary | ICD-10-CM

## 2017-11-23 DIAGNOSIS — E039 Hypothyroidism, unspecified: Secondary | ICD-10-CM | POA: Insufficient documentation

## 2017-11-23 DIAGNOSIS — Z880 Allergy status to penicillin: Secondary | ICD-10-CM | POA: Insufficient documentation

## 2017-11-23 DIAGNOSIS — E7800 Pure hypercholesterolemia, unspecified: Secondary | ICD-10-CM | POA: Diagnosis not present

## 2017-11-23 DIAGNOSIS — I10 Essential (primary) hypertension: Secondary | ICD-10-CM

## 2017-11-23 LAB — POCT GLYCOSYLATED HEMOGLOBIN (HGB A1C): HbA1c, POC (controlled diabetic range): 5.5 % (ref 0.0–7.0)

## 2017-11-23 LAB — GLUCOSE, POCT (MANUAL RESULT ENTRY): POC Glucose: 135 mg/dl — AB (ref 70–99)

## 2017-11-23 MED ORDER — ATORVASTATIN CALCIUM 40 MG PO TABS: 40.0000 mg | ORAL_TABLET | Freq: Every day | ORAL | 6 refills | Status: DC

## 2017-11-23 MED ORDER — FLUTICASONE PROPIONATE 50 MCG/ACT NA SUSP: 2.0000 | Freq: Every day | NASAL | 2 refills | Status: DC

## 2017-11-23 MED ORDER — PANTOPRAZOLE SODIUM 40 MG PO TBEC: 40.0000 mg | DELAYED_RELEASE_TABLET | Freq: Two times a day (BID) | ORAL | 6 refills | Status: DC

## 2017-11-23 MED ORDER — FAMOTIDINE 20 MG PO TABS: 20.0000 mg | ORAL_TABLET | Freq: Two times a day (BID) | ORAL | 1 refills | Status: DC

## 2017-11-23 MED ORDER — AMLODIPINE BESYLATE 10 MG PO TABS: 10.0000 mg | ORAL_TABLET | Freq: Every day | ORAL | 1 refills | Status: DC

## 2017-11-23 MED ORDER — CARVEDILOL 6.25 MG PO TABS: 6.2500 mg | ORAL_TABLET | Freq: Two times a day (BID) | ORAL | 3 refills | Status: DC

## 2017-11-23 MED ORDER — ALBUTEROL SULFATE HFA 108 (90 BASE) MCG/ACT IN AERS: 2.0000 | INHALATION_SPRAY | Freq: Four times a day (QID) | RESPIRATORY_TRACT | 1 refills | Status: DC | PRN

## 2017-11-23 MED ORDER — GABAPENTIN 300 MG PO CAPS: 300.0000 mg | ORAL_CAPSULE | Freq: Two times a day (BID) | ORAL | 1 refills | Status: DC

## 2017-11-23 MED ORDER — ALLOPURINOL 100 MG PO TABS: 150.0000 mg | ORAL_TABLET | Freq: Every day | ORAL | 6 refills | Status: DC

## 2017-11-23 NOTE — Patient Instructions (Signed)

## 2017-11-23 NOTE — Progress Notes (Signed)
Subjective:  Patient ID: Rachael George, female    DOB: 09/13/1955  Age: 62 y.o. MRN: [id number]  CC: Diabetes   HPI BEVERELY George is a 62 year old female with a history of type 2 diabetes mellitus (A1c 5.5 diet-controlled) hypertension, hyperlipidemia, end-stage renal disease (on hemodialysis Mondays, Wednesdays and Fridays), Gout, asthma here for follow-up visit. She has had to use her rescue inhaler a lot recently and uses it twice daily for about 4 days out of the week.  On further questioning she uses Symbicort as needed.  Denies wheezing, chest pains and was last seen by pulmonary in 04/2017. With regards to her diabetes mellitus, she denies hypoglycemia.  She is not up-to-date on her annual eye exam but is scheduled to see ophthalmology on 12/21/2017.  Denies neuropathy at this time. For her reflux she has to use Protonix and famotidine to control her symptoms. Doing well on all her other medications and denies adverse effects on hemodialysis sessions are going well; she is currently being worked up for renal transplant.  Past Medical History:  Diagnosis Date  . Anemia   . Asthma   . ESRD (end stage renal disease) ([place])    MWF  . GERD (gastroesophageal reflux disease)   . Gout   . 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 Heavy menstrual bleeding   . High cholesterol   . History of blood transfusion 02/2016- last one   1990's- several ones   . History of kidney stones 1980's  . Hyperlipemia 12/07/2012  . Hypertension   . Hypothyroidism   . Kidney stones   . Pre-diabetes   . Prediabetes   . RA (rheumatoid arthritis) ([place])   . Sleep apnea    [place]- have CPAP, not able to use every night     Past Surgical History:  Procedure Laterality Date  . A/V SHUNTOGRAM N/A 09/08/2016   Procedure: A/V Shuntogram - Left Arm AV Graft;  Surgeon: Serafina Mitchell, MD;  Location: [facility];  Service: Cardiovascular;  Laterality: N/A;  . A/V SHUNTOGRAM N/A  09/02/2017   Procedure: A/V SHUNTOGRAM - Left Arm AVG;  Surgeon: Conrad Middleton, MD;  Location: [facility];  Service: Cardiovascular;  Laterality: N/A;  . ABDOMINAL HYSTERECTOMY  2000  . AV FISTULA PLACEMENT Left 05/25/2016   Procedure: INSERTION OF LEFT UPPER ARM ARTERIOVENOUS (AV) LOOP GORE-TEX GRAFT ARM;  Surgeon: Elam Dutch, MD;  Location: [facility];  Service: Vascular;  Laterality: Left;  . BASCILIC VEIN TRANSPOSITION Right 12/06/2014   Procedure: FIRST STAGE BASILIC VEIN TRANSPOSITION - RIGHT;  Surgeon: Serafina Mitchell, MD;  Location: [place] [place];  Service: Vascular;  Laterality: Right;  . BASCILIC VEIN TRANSPOSITION Right 02/07/2015   Procedure: RIGHT ARM SECOND STAGE BASILIC VEIN TRANSPOSITION;  Surgeon: Serafina Mitchell, MD;  Location: [place] [facility];  Service: Vascular;  Laterality: Right;  . BASCILIC VEIN TRANSPOSITION Left 04/22/2016   Procedure: FIRST STAGE BASILIC VEIN TRANSPOSITION LEFT ARM;  Surgeon: Conrad [place], MD;  Location: [place];  Service: Vascular;  Laterality: Left;  . COLONOSCOPY W/ POLYPECTOMY    . ECTOPIC PREGNANCY SURGERY  02/1982  . EXCHANGE OF A DIALYSIS CATHETER Right 04/22/2016   Procedure: EXCHANGE OF A DIALYSIS CATHETER - INSERTION RIGHT INTERNAL JUGULAR & REMOVAL FROM LEFT INTERNAL JUGULAR;  Surgeon: Conrad [place]ville, MD;  Location: [facility];  Service: Vascular;  Laterality: Right;  . I&D EXTREMITY Right  02/07/2015   Procedure: IRRIGATION AND DEBRIDEMENT RIGHT ARM HEMATOMA;  Surgeon: Serafina Mitchell, MD;  Location: [facility] [facility];  Service: Vascular;  Laterality: Right;  . INSERTION OF DIALYSIS CATHETER  03/03/2016   Procedure: INSERTION OF DIALYSIS CATHETER;  Surgeon: Elam Dutch, MD;  Location: [place];  Service: Vascular;;  . IR AV DIALY SHUNT INTRO NEEDLE/INTRACATH INITIAL W/PTA/IMG LEFT  04/20/2017  . IR AV DIALY SHUNT INTRO NEEDLE/INTRACATH INITIAL W/PTA/IMG LEFT  06/24/2017  . IR THROMBECTOMY AV FISTULA W/THROMBOLYSIS/PTA INC/SHUNT/IMG LEFT Left 01/04/2017  . IR US GUIDE VASC  ACCESS LEFT  01/04/2017  . left shoulder surgery  08/2005  . LIGATION OF ARTERIOVENOUS  FISTULA  03/03/2016   Procedure: LIGATION OF ARTERIOVENOUS  FISTULA;  Surgeon: Elam Dutch, MD;  Location: [place];  Service: Vascular;;  . PERIPHERAL VASCULAR BALLOON ANGIOPLASTY  09/08/2016   Procedure: Peripheral Vascular Balloon Angioplasty;  Surgeon: Serafina Mitchell, MD;  Location: [facility];  Service: Cardiovascular;;  left avf  . PERIPHERAL VASCULAR BALLOON ANGIOPLASTY  09/02/2017   Procedure: PERIPHERAL VASCULAR BALLOON ANGIOPLASTY;  Surgeon: Conrad [place], MD;  Location: [facility];  Service: Cardiovascular;;  left AV Graft  . TEE WITHOUT CARDIOVERSION N/A 03/06/2016   Procedure: TRANSESOPHAGEAL ECHOCARDIOGRAM (TEE);  Surgeon: Sueanne Margarita, MD;  Location: [facility];  Service: Cardiovascular;  Laterality: N/A;  . TUBAL LIGATION  11/1986    Allergies  Allergen Reactions  . Atacand [Candesartan] Anaphylaxis and Other (See Comments)    Swelling of the mouth and tongue.  Marland Kitchen Lisinopril Anaphylaxis and Other (See Comments)    Swelling of the mouth and tongue.  . Nsaids Anaphylaxis and Other (See Comments)    Swelling of the mouth and tongue.  Marland Kitchen Penicillins Rash and Other (See Comments)    Has patient had a PCN reaction causing immediate rash, facial/tongue/throat swelling, SOB or lightheadedness with hypotension: No Has patient had a PCN reaction causing severe rash involving mucus membranes or skin necrosis: No Has patient had a PCN reaction that required hospitalization No Has patient had a PCN reaction occurring within the last 10 years: No If all of the above answers are "NO", then may proceed with Cephalosporin use.      Outpatient Medications Prior to Visit  Medication Sig Dispense Refill  . acetaminophen (TYLENOL) 500 MG tablet Take 1,000 mg by mouth every 6 (six) hours as needed for mild pain.     Marland Kitchen albuterol (PROVENTIL) (2.5 MG/3ML) 0.083% nebulizer solution Take 3  mLs (2.5 mg total) by nebulization every 6 (six) hours as needed for wheezing or shortness of breath. ICD 10:J45.20 75 mL 12  . chlorpheniramine (CHLOR-TRIMETON) 4 MG tablet Take 1 tablet (4 mg total) by mouth 3 (three) times daily. 190 tablet 0  . cinacalcet (SENSIPAR) 60 MG tablet Take 120 mg by mouth daily.     . cyclobenzaprine (FLEXERIL) 10 MG tablet Take 1 tablet (10 mg total) by mouth 2 (two) times daily as needed. For back pain (Patient taking differently: Take 10 mg by mouth 3 (three) times daily as needed (for back pain). ) 60 tablet 1  . multivitamin (RENA-VIT) TABS tablet Take 1 tablet by mouth daily.    Marland Kitchen triamcinolone cream (KENALOG) 0.1 % Apply 1 application topically 2 (two) times daily. Applied to eczematous lesions (Patient taking differently: Apply 1 application topically 2 (two) times daily as needed (for eczema). ) 80 g 1  . allopurinol (ZYLOPRIM) 100 MG tablet Take 100 mg  by mouth 2 (two) times daily.     Marland Kitchen amLODipine (NORVASC) 10 MG tablet Take 10 mg by mouth at bedtime.     Marland Kitchen atorvastatin (LIPITOR) 40 MG tablet Take 1 tablet (40 mg total) by mouth daily. 30 tablet 6  . carvedilol (COREG) 6.25 MG tablet Take 1 tablet (6.25 mg total) by mouth 2 (two) times daily with a meal. 60 tablet 3  . famotidine (PEPCID AC MAXIMUM STRENGTH) 20 MG tablet Take 20 mg by mouth 2 (two) times daily.    . fluticasone (FLONASE) 50 MCG/ACT nasal spray Place 2 sprays into both nostrils daily. 16 g 2  . gabapentin (NEURONTIN) 300 MG capsule TAKE 1 CAPSULE BY MOUTH TWICE A DAY 180 capsule 2  . pantoprazole (PROTONIX) 40 MG tablet Take 40 mg by mouth 2 (two) times daily.    . VENTOLIN HFA 108 (90 Base) MCG/ACT inhaler INHALE 2 PUFFS INTO THE LUNGS EVERY 6 HOURS AS NEEDED FOR WHEEZING ORSHORTNESS OF BREATH 18 g 1  . budesonide-formoterol (SYMBICORT) 160-4.5 MCG/ACT inhaler Inhale 2 puffs into the lungs 2 (two) times daily for 1 day. 1 Inhaler 5  . sucroferric oxyhydroxide (VELPHORO) 500 MG chewable  tablet Chew 1,000-1,500 mg by mouth See admin instructions. Take 3 tablets (15oomg) three times daily with meals, and 2 tablets (1000mg ) with snacks    . meclizine (ANTIVERT) 25 MG tablet Take 1 tablet (25 mg total) by mouth 2 (two) times daily as needed for dizziness. (Patient not taking: Reported on 11/23/2017) 60 tablet 3   No facility-administered medications prior to visit.     ROS Review of Systems  Constitutional: Negative for activity change, appetite change and fatigue.  HENT: Negative for congestion, sinus pressure and sore throat.   Eyes: Negative for visual disturbance.  Respiratory: Positive for shortness of breath. Negative for cough, chest tightness and wheezing.   Cardiovascular: Negative for chest pain and palpitations.  Gastrointestinal: Negative for abdominal distention, abdominal pain and constipation.  Endocrine: Negative for polydipsia.  Genitourinary: Negative for dysuria and frequency.  Musculoskeletal: Negative for arthralgias and back pain.  Skin: Negative for rash.  Neurological: Negative for tremors, light-headedness and numbness.  Hematological: Does not bruise/bleed easily.  Psychiatric/Behavioral: Negative for agitation and behavioral problems.    Objective:  BP (!) 161/95   Pulse 79   Temp 98.1 F (36.7 C) (Oral)   Ht 5' 1.5" (1.562 m)   Wt 176 lb 9.6 oz (80.1 kg)   SpO2 97%   BMI 32.83 kg/m   BP/Weight 11/23/2017 09/07/2017 0/96/2836  Systolic BP 629 476 546  Diastolic BP 95 76 81  Wt. (Lbs) 176.6 178.2 -  BMI 32.83 33.13 -      Physical Exam  Constitutional: She is oriented to person, place, and time. She appears well-developed and well-nourished.  Cardiovascular: Normal rate, normal heart sounds and intact distal pulses.  No murmur heard. Pulmonary/Chest: Effort normal and breath sounds normal. She has no wheezes. She has no rales. She exhibits no tenderness.  Abdominal: Soft. Bowel sounds are normal. She exhibits no distension and no  mass. There is no tenderness.  Musculoskeletal: Normal range of motion.  Left arm AV fistula with palpable thrill  Neurological: She is alert and oriented to person, place, and time.  Skin: Skin is warm and dry.  Psychiatric: She has a normal mood and affect.    Lab Results  Component Value Date   HGBA1C 5.5 11/23/2017    Assessment & Plan:   1.  Type 2 diabetes mellitus with other specified complication, without long-term current use of insulin (HCC) Controlled with A1c of 5.5 Has upcoming appointment with ophthalmology on 12/21/2017 Counseled on Diabetic diet, my plate method, 902 minutes of moderate intensity exercise/week Keep blood sugar logs with fasting goals of 80-120 mg/dl, random of less than 180 and in the event of sugars less than 60 mg/dl or greater than 400 mg/dl please notify the clinic ASAP. It is recommended that you undergo annual eye exams and annual foot exams. Pneumonia vaccine is recommended. - POCT glucose (manual entry) - POCT glycosylated hemoglobin (Hb A1C) - atorvastatin (LIPITOR) 40 MG tablet; Take 1 tablet (40 mg total) by mouth daily.  Dispense: 30 tablet; Refill: 6  2. Chronic right-sided low back pain without sciatica Stable - gabapentin (NEURONTIN) 300 MG capsule; Take 1 capsule (300 mg total) by mouth 2 (two) times daily.  Dispense: 180 capsule; Refill: 1  3. Essential hypertension, benign Uncontrolled No regimen change today as she is prone to hypotension on dialysis days Counseled on blood pressure goal of less than 130/80, low-sodium, DASH diet, medication compliance, 150 minutes of moderate intensity exercise per week. Discussed medication compliance, adverse effects. - carvedilol (COREG) 6.25 MG tablet; Take 1 tablet (6.25 mg total) by mouth 2 (two) times daily with a meal.  Dispense: 60 tablet; Refill: 3 - amLODipine (NORVASC) 10 MG tablet; Take 1 tablet (10 mg total) by mouth at bedtime.  Dispense: 90 tablet; Refill: 1  4. Need for  immunization against influenza - Flu Vaccine QUAD 36+ mos IM  5. Mild intermittent asthma, unspecified whether complicated Uncontrolled Advised to use Symbicort regularly as she has been using it only as needed She currently sees pulmonary - albuterol (VENTOLIN HFA) 108 (90 Base) MCG/ACT inhaler; Inhale 2 puffs into the lungs every 6 (six) hours as needed for wheezing or shortness of breath.  Dispense: 18 g; Refill: 1  6. Idiopathic chronic gout of multiple sites without tophus No recent flares - allopurinol (ZYLOPRIM) 100 MG tablet; Take 1.5 tablets (150 mg total) by mouth daily.  Dispense: 45 tablet; Refill: 6  7. ESRD Continue hemodialysis as per schedule Currently being worked up for renal transplant  Meds ordered this encounter  Medications  . albuterol (VENTOLIN HFA) 108 (90 Base) MCG/ACT inhaler    Sig: Inhale 2 puffs into the lungs every 6 (six) hours as needed for wheezing or shortness of breath.    Dispense:  18 g    Refill:  1  . pantoprazole (PROTONIX) 40 MG tablet    Sig: Take 1 tablet (40 mg total) by mouth 2 (two) times daily.    Dispense:  60 tablet    Refill:  6  . gabapentin (NEURONTIN) 300 MG capsule    Sig: Take 1 capsule (300 mg total) by mouth 2 (two) times daily.    Dispense:  180 capsule    Refill:  1  . fluticasone (FLONASE) 50 MCG/ACT nasal spray    Sig: Place 2 sprays into both nostrils daily.    Dispense:  16 g    Refill:  2  . famotidine (PEPCID AC MAXIMUM STRENGTH) 20 MG tablet    Sig: Take 1 tablet (20 mg total) by mouth 2 (two) times daily.    Dispense:  60 tablet    Refill:  1  . carvedilol (COREG) 6.25 MG tablet    Sig: Take 1 tablet (6.25 mg total) by mouth 2 (two) times daily with a meal.  Dispense:  60 tablet    Refill:  3  . atorvastatin (LIPITOR) 40 MG tablet    Sig: Take 1 tablet (40 mg total) by mouth daily.    Dispense:  30 tablet    Refill:  6  . amLODipine (NORVASC) 10 MG tablet    Sig: Take 1 tablet (10 mg total) by mouth  at bedtime.    Dispense:  90 tablet    Refill:  1  . allopurinol (ZYLOPRIM) 100 MG tablet    Sig: Take 1.5 tablets (150 mg total) by mouth daily.    Dispense:  45 tablet    Refill:  6    Follow-up: Return in about 6 months (around 05/24/2018) for Follow up of chronic medical conditions.   Charlott Rakes MD

## 2017-11-24 DIAGNOSIS — N2581 Secondary hyperparathyroidism of renal origin: Secondary | ICD-10-CM | POA: Diagnosis not present

## 2017-11-24 DIAGNOSIS — D631 Anemia in chronic kidney disease: Secondary | ICD-10-CM | POA: Diagnosis not present

## 2017-11-24 DIAGNOSIS — E1129 Type 2 diabetes mellitus with other diabetic kidney complication: Secondary | ICD-10-CM | POA: Diagnosis not present

## 2017-11-24 DIAGNOSIS — N186 End stage renal disease: Secondary | ICD-10-CM | POA: Diagnosis not present

## 2017-11-29 DIAGNOSIS — E1129 Type 2 diabetes mellitus with other diabetic kidney complication: Secondary | ICD-10-CM | POA: Diagnosis not present

## 2017-11-29 DIAGNOSIS — N186 End stage renal disease: Secondary | ICD-10-CM | POA: Diagnosis not present

## 2017-11-29 DIAGNOSIS — N2581 Secondary hyperparathyroidism of renal origin: Secondary | ICD-10-CM | POA: Diagnosis not present

## 2017-11-29 DIAGNOSIS — D631 Anemia in chronic kidney disease: Secondary | ICD-10-CM | POA: Diagnosis not present

## 2017-12-01 DIAGNOSIS — E1129 Type 2 diabetes mellitus with other diabetic kidney complication: Secondary | ICD-10-CM | POA: Diagnosis not present

## 2017-12-01 DIAGNOSIS — D631 Anemia in chronic kidney disease: Secondary | ICD-10-CM | POA: Diagnosis not present

## 2017-12-01 DIAGNOSIS — N2581 Secondary hyperparathyroidism of renal origin: Secondary | ICD-10-CM | POA: Diagnosis not present

## 2017-12-01 DIAGNOSIS — N186 End stage renal disease: Secondary | ICD-10-CM | POA: Diagnosis not present

## 2017-12-03 DIAGNOSIS — N2581 Secondary hyperparathyroidism of renal origin: Secondary | ICD-10-CM | POA: Diagnosis not present

## 2017-12-03 DIAGNOSIS — N186 End stage renal disease: Secondary | ICD-10-CM | POA: Diagnosis not present

## 2017-12-03 DIAGNOSIS — E1129 Type 2 diabetes mellitus with other diabetic kidney complication: Secondary | ICD-10-CM | POA: Diagnosis not present

## 2017-12-03 DIAGNOSIS — D631 Anemia in chronic kidney disease: Secondary | ICD-10-CM | POA: Diagnosis not present

## 2017-12-06 DIAGNOSIS — E1129 Type 2 diabetes mellitus with other diabetic kidney complication: Secondary | ICD-10-CM | POA: Diagnosis not present

## 2017-12-06 DIAGNOSIS — N186 End stage renal disease: Secondary | ICD-10-CM | POA: Diagnosis not present

## 2017-12-06 DIAGNOSIS — N2581 Secondary hyperparathyroidism of renal origin: Secondary | ICD-10-CM | POA: Diagnosis not present

## 2017-12-06 DIAGNOSIS — D631 Anemia in chronic kidney disease: Secondary | ICD-10-CM | POA: Diagnosis not present

## 2017-12-08 DIAGNOSIS — D631 Anemia in chronic kidney disease: Secondary | ICD-10-CM | POA: Diagnosis not present

## 2017-12-08 DIAGNOSIS — E1129 Type 2 diabetes mellitus with other diabetic kidney complication: Secondary | ICD-10-CM | POA: Diagnosis not present

## 2017-12-08 DIAGNOSIS — N186 End stage renal disease: Secondary | ICD-10-CM | POA: Diagnosis not present

## 2017-12-08 DIAGNOSIS — N2581 Secondary hyperparathyroidism of renal origin: Secondary | ICD-10-CM | POA: Diagnosis not present

## 2017-12-09 ENCOUNTER — Ambulatory Visit: Payer: Medicare Other

## 2017-12-10 DIAGNOSIS — E1129 Type 2 diabetes mellitus with other diabetic kidney complication: Secondary | ICD-10-CM | POA: Diagnosis not present

## 2017-12-10 DIAGNOSIS — N186 End stage renal disease: Secondary | ICD-10-CM | POA: Diagnosis not present

## 2017-12-10 DIAGNOSIS — N2581 Secondary hyperparathyroidism of renal origin: Secondary | ICD-10-CM | POA: Diagnosis not present

## 2017-12-10 DIAGNOSIS — D631 Anemia in chronic kidney disease: Secondary | ICD-10-CM | POA: Diagnosis not present

## 2017-12-13 ENCOUNTER — Encounter: Payer: Self-pay | Admitting: *Deleted

## 2017-12-13 ENCOUNTER — Ambulatory Visit (INDEPENDENT_AMBULATORY_CARE_PROVIDER_SITE_OTHER): Payer: Medicare Other | Admitting: Physician Assistant

## 2017-12-13 ENCOUNTER — Other Ambulatory Visit: Payer: Self-pay | Admitting: *Deleted

## 2017-12-13 ENCOUNTER — Other Ambulatory Visit: Payer: Self-pay

## 2017-12-13 VITALS — BP 170/101 | HR 86 | Temp 98.6°F | Resp 16 | Ht 62.0 in | Wt 173.0 lb

## 2017-12-13 DIAGNOSIS — N2581 Secondary hyperparathyroidism of renal origin: Secondary | ICD-10-CM | POA: Diagnosis not present

## 2017-12-13 DIAGNOSIS — N186 End stage renal disease: Secondary | ICD-10-CM | POA: Diagnosis not present

## 2017-12-13 DIAGNOSIS — D631 Anemia in chronic kidney disease: Secondary | ICD-10-CM | POA: Diagnosis not present

## 2017-12-13 DIAGNOSIS — E1129 Type 2 diabetes mellitus with other diabetic kidney complication: Secondary | ICD-10-CM | POA: Diagnosis not present

## 2017-12-13 NOTE — H&P (View-Only) (Signed)
Established Dialysis Access   History of Present Illness   Rachael George is a 62 y.o. (29-Dec-1955) female who presents for re-evaluation of permanent access.  She is status post left upper arm loop AV graft by Dr. Oneida Alar on 05/25/2016.  She has required several venous outflow interventions every 3 to 6 months.  She states that over the past couple weeks she is had some pain, swelling, and a tight feeling in her axilla during hemodialysis treatments.  Patient states that in the past when she has had this discomfort she was found to have narrowing in her outflow vein requiring balloon angioplasty.  She is unsure what her flow rates have been during hemodialysis.  She denies any trouble completing her hemodialysis treatments.  She is aware of the risks of a shuntogram.  She is dialyzing on a Monday Wednesday Friday schedule under the care of Dr. Posey Pronto.  Current Outpatient Medications  Medication Sig Dispense Refill  . acetaminophen (TYLENOL) 500 MG tablet Take 1,000 mg by mouth every 6 (six) hours as needed for mild pain.     Marland Kitchen albuterol (PROVENTIL) (2.5 MG/3ML) 0.083% nebulizer solution Take 3 mLs (2.5 mg total) by nebulization every 6 (six) hours as needed for wheezing or shortness of breath. ICD 10:J45.20 75 mL 12  . albuterol (VENTOLIN HFA) 108 (90 Base) MCG/ACT inhaler Inhale 2 puffs into the lungs every 6 (six) hours as needed for wheezing or shortness of breath. 18 g 1  . allopurinol (ZYLOPRIM) 100 MG tablet Take 1.5 tablets (150 mg total) by mouth daily. 45 tablet 6  . amLODipine (NORVASC) 10 MG tablet Take 1 tablet (10 mg total) by mouth at bedtime. 90 tablet 1  . atorvastatin (LIPITOR) 40 MG tablet Take 1 tablet (40 mg total) by mouth daily. 30 tablet 6  . carvedilol (COREG) 6.25 MG tablet Take 1 tablet (6.25 mg total) by mouth 2 (two) times daily with a meal. 60 tablet 3  . chlorpheniramine (CHLOR-TRIMETON) 4 MG tablet Take 1 tablet (4 mg total) by mouth 3 (three) times daily. 190  tablet 0  . cinacalcet (SENSIPAR) 60 MG tablet Take 120 mg by mouth daily.     . cyclobenzaprine (FLEXERIL) 10 MG tablet Take 1 tablet (10 mg total) by mouth 2 (two) times daily as needed. For back pain (Patient taking differently: Take 10 mg by mouth 3 (three) times daily as needed (for back pain). ) 60 tablet 1  . famotidine (PEPCID AC MAXIMUM STRENGTH) 20 MG tablet Take 1 tablet (20 mg total) by mouth 2 (two) times daily. 60 tablet 1  . fluticasone (FLONASE) 50 MCG/ACT nasal spray Place 2 sprays into both nostrils daily. 16 g 2  . gabapentin (NEURONTIN) 300 MG capsule Take 1 capsule (300 mg total) by mouth 2 (two) times daily. 180 capsule 1  . multivitamin (RENA-VIT) TABS tablet Take 1 tablet by mouth daily.    . pantoprazole (PROTONIX) 40 MG tablet Take 1 tablet (40 mg total) by mouth 2 (two) times daily. 60 tablet 6  . triamcinolone cream (KENALOG) 0.1 % Apply 1 application topically 2 (two) times daily. Applied to eczematous lesions (Patient taking differently: Apply 1 application topically 2 (two) times daily as needed (for eczema). ) 80 g 1  . budesonide-formoterol (SYMBICORT) 160-4.5 MCG/ACT inhaler Inhale 2 puffs into the lungs 2 (two) times daily for 1 day. 1 Inhaler 5  . sucroferric oxyhydroxide (VELPHORO) 500 MG chewable tablet Chew 1,000-1,500 mg by mouth See admin instructions.  Take 3 tablets (15oomg) three times daily with meals, and 2 tablets (1000mg ) with snacks     No current facility-administered medications for this visit.      Physical Examination   Vitals:   12/13/17 1258 12/13/17 1304  BP: (!) 156/101 (!) 170/101  Pulse: 86 86  Resp: 16   Temp: 98.6 F (37 C)   TempSrc: Oral   SpO2: 99%   Weight: 173 lb (78.5 kg)   Height: 5\' 2"  (1.575 m)    Body mass index is 31.64 kg/m.  General Alert, O x 3, WD, NAD  Pulmonary Sym exp, good B air movt,  Cardiac RRR, Nl S1, S2,   Vascular Vessel Right Left  Radial Palpable Palpable  Brachial Palpable Palpable  Ulnar  Not palpable Not palpable    Musculo- skeletal  palpable thrill at the 6 o'clock position of left upper arm loop graft; more pulsatile approaching axilla of venous side; palpable L radial pulse M/S 5/5 throughout  , Extremities without ischemic changes    Neurologic A&O; CN grossly intact     Medical Decision Making   Rachael George is a 62 y.o. female who presents with ESRD requiring hemodialysis.    Based on physical exam and history of outflow stenosis with current symptoms, the plan will be for L arm shuntogram with possible revascularization on next available non-dialysis day  Patient was made aware of the risks of shuntogram including bleeding, infection, thrombosis of graft, and need for further interventions  She is willing to proceed  Continue HD via L arm AVG for now  Dagoberto Ligas PA-C [facility]: [phone]

## 2017-12-13 NOTE — Progress Notes (Signed)
Established Dialysis Access   History of Present Illness   Rachael George is a 62 y.o. (Nov 16, 1955) female who presents for re-evaluation of permanent access.  She is status post left upper arm loop AV graft by Dr. Oneida Alar on 05/25/2016.  She has required several venous outflow interventions every 3 to 6 months.  She states that over the past couple weeks she is had some pain, swelling, and a tight feeling in her axilla during hemodialysis treatments.  Patient states that in the past when she has had this discomfort she was found to have narrowing in her outflow vein requiring balloon angioplasty.  She is unsure what her flow rates have been during hemodialysis.  She denies any trouble completing her hemodialysis treatments.  She is aware of the risks of a shuntogram.  She is dialyzing on a Monday Wednesday Friday schedule under the care of Dr. Posey Pronto.  Current Outpatient Medications  Medication Sig Dispense Refill  . acetaminophen (TYLENOL) 500 MG tablet Take 1,000 mg by mouth every 6 (six) hours as needed for mild pain.     Marland Kitchen albuterol (PROVENTIL) (2.5 MG/3ML) 0.083% nebulizer solution Take 3 mLs (2.5 mg total) by nebulization every 6 (six) hours as needed for wheezing or shortness of breath. ICD 10:J45.20 75 mL 12  . albuterol (VENTOLIN HFA) 108 (90 Base) MCG/ACT inhaler Inhale 2 puffs into the lungs every 6 (six) hours as needed for wheezing or shortness of breath. 18 g 1  . allopurinol (ZYLOPRIM) 100 MG tablet Take 1.5 tablets (150 mg total) by mouth daily. 45 tablet 6  . amLODipine (NORVASC) 10 MG tablet Take 1 tablet (10 mg total) by mouth at bedtime. 90 tablet 1  . atorvastatin (LIPITOR) 40 MG tablet Take 1 tablet (40 mg total) by mouth daily. 30 tablet 6  . carvedilol (COREG) 6.25 MG tablet Take 1 tablet (6.25 mg total) by mouth 2 (two) times daily with a meal. 60 tablet 3  . chlorpheniramine (CHLOR-TRIMETON) 4 MG tablet Take 1 tablet (4 mg total) by mouth 3 (three) times daily. 190  tablet 0  . cinacalcet (SENSIPAR) 60 MG tablet Take 120 mg by mouth daily.     . cyclobenzaprine (FLEXERIL) 10 MG tablet Take 1 tablet (10 mg total) by mouth 2 (two) times daily as needed. For back pain (Patient taking differently: Take 10 mg by mouth 3 (three) times daily as needed (for back pain). ) 60 tablet 1  . famotidine (PEPCID AC MAXIMUM STRENGTH) 20 MG tablet Take 1 tablet (20 mg total) by mouth 2 (two) times daily. 60 tablet 1  . fluticasone (FLONASE) 50 MCG/ACT nasal spray Place 2 sprays into both nostrils daily. 16 g 2  . gabapentin (NEURONTIN) 300 MG capsule Take 1 capsule (300 mg total) by mouth 2 (two) times daily. 180 capsule 1  . multivitamin (RENA-VIT) TABS tablet Take 1 tablet by mouth daily.    . pantoprazole (PROTONIX) 40 MG tablet Take 1 tablet (40 mg total) by mouth 2 (two) times daily. 60 tablet 6  . triamcinolone cream (KENALOG) 0.1 % Apply 1 application topically 2 (two) times daily. Applied to eczematous lesions (Patient taking differently: Apply 1 application topically 2 (two) times daily as needed (for eczema). ) 80 g 1  . budesonide-formoterol (SYMBICORT) 160-4.5 MCG/ACT inhaler Inhale 2 puffs into the lungs 2 (two) times daily for 1 day. 1 Inhaler 5  . sucroferric oxyhydroxide (VELPHORO) 500 MG chewable tablet Chew 1,000-1,500 mg by mouth See admin instructions.  Take 3 tablets (15oomg) three times daily with meals, and 2 tablets (1000mg ) with snacks     No current facility-administered medications for this visit.      Physical Examination   Vitals:   12/13/17 1258 12/13/17 1304  BP: (!) 156/101 (!) 170/101  Pulse: 86 86  Resp: 16   Temp: 98.6 F (37 C)   TempSrc: Oral   SpO2: 99%   Weight: 173 lb (78.5 kg)   Height: 5\' 2"  (1.575 m)    Body mass index is 31.64 kg/m.  General Alert, O x 3, WD, NAD  Pulmonary Sym exp, good B air movt,  Cardiac RRR, Nl S1, S2,   Vascular Vessel Right Left  Radial Palpable Palpable  Brachial Palpable Palpable  Ulnar  Not palpable Not palpable    Musculo- skeletal  palpable thrill at the 6 o'clock position of left upper arm loop graft; more pulsatile approaching axilla of venous side; palpable L radial pulse M/S 5/5 throughout  , Extremities without ischemic changes    Neurologic A&O; CN grossly intact     Medical Decision Making   Rachael George is a 62 y.o. female who presents with ESRD requiring hemodialysis.    Based on physical exam and history of outflow stenosis with current symptoms, the plan will be for L arm shuntogram with possible revascularization on next available non-dialysis day  Patient was made aware of the risks of shuntogram including bleeding, infection, thrombosis of graft, and need for further interventions  She is willing to proceed  Continue HD via L arm AVG for now  Dagoberto Ligas PA-C [facility]: [phone]

## 2017-12-13 NOTE — Progress Notes (Signed)
Vitals:   12/13/17 1258  BP: (!) 156/101  Pulse: 86  Resp: 16  Temp: 98.6 F (37 C)  TempSrc: Oral  SpO2: 99%  Weight: 173 lb (78.5 kg)  Height: 5\' 2"  (1.575 m)

## 2017-12-15 ENCOUNTER — Other Ambulatory Visit: Payer: Self-pay | Admitting: Family Medicine

## 2017-12-15 DIAGNOSIS — N186 End stage renal disease: Secondary | ICD-10-CM | POA: Diagnosis not present

## 2017-12-15 DIAGNOSIS — D631 Anemia in chronic kidney disease: Secondary | ICD-10-CM | POA: Diagnosis not present

## 2017-12-15 DIAGNOSIS — E1129 Type 2 diabetes mellitus with other diabetic kidney complication: Secondary | ICD-10-CM | POA: Diagnosis not present

## 2017-12-15 DIAGNOSIS — N2581 Secondary hyperparathyroidism of renal origin: Secondary | ICD-10-CM | POA: Diagnosis not present

## 2017-12-16 ENCOUNTER — Other Ambulatory Visit (HOSPITAL_COMMUNITY): Payer: Self-pay | Admitting: Gastroenterology

## 2017-12-16 DIAGNOSIS — K219 Gastro-esophageal reflux disease without esophagitis: Secondary | ICD-10-CM

## 2017-12-16 DIAGNOSIS — R1110 Vomiting, unspecified: Secondary | ICD-10-CM

## 2017-12-16 DIAGNOSIS — R1111 Vomiting without nausea: Secondary | ICD-10-CM | POA: Diagnosis not present

## 2017-12-17 DIAGNOSIS — E1129 Type 2 diabetes mellitus with other diabetic kidney complication: Secondary | ICD-10-CM | POA: Diagnosis not present

## 2017-12-17 DIAGNOSIS — N2581 Secondary hyperparathyroidism of renal origin: Secondary | ICD-10-CM | POA: Diagnosis not present

## 2017-12-17 DIAGNOSIS — N186 End stage renal disease: Secondary | ICD-10-CM | POA: Diagnosis not present

## 2017-12-17 DIAGNOSIS — D631 Anemia in chronic kidney disease: Secondary | ICD-10-CM | POA: Diagnosis not present

## 2017-12-20 DIAGNOSIS — N2581 Secondary hyperparathyroidism of renal origin: Secondary | ICD-10-CM | POA: Diagnosis not present

## 2017-12-20 DIAGNOSIS — E1129 Type 2 diabetes mellitus with other diabetic kidney complication: Secondary | ICD-10-CM | POA: Diagnosis not present

## 2017-12-20 DIAGNOSIS — N186 End stage renal disease: Secondary | ICD-10-CM | POA: Diagnosis not present

## 2017-12-20 DIAGNOSIS — D631 Anemia in chronic kidney disease: Secondary | ICD-10-CM | POA: Diagnosis not present

## 2017-12-21 ENCOUNTER — Ambulatory Visit (HOSPITAL_COMMUNITY)
Admission: RE | Admit: 2017-12-21 | Discharge: 2017-12-21 | Disposition: A | Discharge status: Home / Self Care | Payer: Medicare Other | Source: Ambulatory Visit | Attending: Surgery | Admitting: Surgery

## 2017-12-21 ENCOUNTER — Encounter (HOSPITAL_COMMUNITY)
Admission: RE | Disposition: A | Discharge status: Home / Self Care | Payer: Self-pay | Source: Ambulatory Visit | Attending: Surgery

## 2017-12-21 ENCOUNTER — Other Ambulatory Visit: Payer: Self-pay

## 2017-12-21 DIAGNOSIS — R112 Nausea with vomiting, unspecified: Secondary | ICD-10-CM | POA: Diagnosis not present

## 2017-12-21 DIAGNOSIS — N186 End stage renal disease: Secondary | ICD-10-CM | POA: Insufficient documentation

## 2017-12-21 DIAGNOSIS — Y832 Surgical operation with anastomosis, bypass or graft as the cause of abnormal reaction of the patient, or of later complication, without mention of misadventure at the time of the procedure: Secondary | ICD-10-CM | POA: Diagnosis not present

## 2017-12-21 DIAGNOSIS — Z7951 Long term (current) use of inhaled steroids: Secondary | ICD-10-CM | POA: Diagnosis not present

## 2017-12-21 DIAGNOSIS — T82898A Other specified complication of vascular prosthetic devices, implants and grafts, initial encounter: Secondary | ICD-10-CM | POA: Diagnosis not present

## 2017-12-21 DIAGNOSIS — T82858A Stenosis of vascular prosthetic devices, implants and grafts, initial encounter: Secondary | ICD-10-CM | POA: Diagnosis not present

## 2017-12-21 DIAGNOSIS — Z992 Dependence on renal dialysis: Secondary | ICD-10-CM | POA: Insufficient documentation

## 2017-12-21 DIAGNOSIS — I129 Hypertensive chronic kidney disease with stage 1 through stage 4 chronic kidney disease, or unspecified chronic kidney disease: Secondary | ICD-10-CM | POA: Diagnosis not present

## 2017-12-21 DIAGNOSIS — Z79899 Other long term (current) drug therapy: Secondary | ICD-10-CM | POA: Diagnosis not present

## 2017-12-21 DIAGNOSIS — R1110 Vomiting, unspecified: Secondary | ICD-10-CM | POA: Diagnosis not present

## 2017-12-21 DIAGNOSIS — Z9889 Other specified postprocedural states: Secondary | ICD-10-CM | POA: Insufficient documentation

## 2017-12-21 DIAGNOSIS — K219 Gastro-esophageal reflux disease without esophagitis: Secondary | ICD-10-CM | POA: Diagnosis not present

## 2017-12-21 HISTORY — PX: PERIPHERAL VASCULAR BALLOON ANGIOPLASTY: CATH118281

## 2017-12-21 HISTORY — PX: A/V SHUNTOGRAM: CATH118297

## 2017-12-21 LAB — POCT I-STAT, CHEM 8
BUN: 42 mg/dL — ABNORMAL HIGH (ref 6–20)
Calcium, Ion: 1.21 mmol/L (ref 1.15–1.40)
Chloride: 96 mmol/L — ABNORMAL LOW (ref 98–111)
Creatinine, Ser: 7.7 mg/dL — ABNORMAL HIGH (ref 0.44–1.00)
Glucose, Bld: 97 mg/dL (ref 70–99)
HCT: 30 % — ABNORMAL LOW (ref 36.0–46.0)
Hemoglobin: 10.2 g/dL — ABNORMAL LOW (ref 12.0–15.0)
Potassium: 5.3 mmol/L — ABNORMAL HIGH (ref 3.5–5.1)
Sodium: 137 mmol/L (ref 135–145)
TCO2: 35 mmol/L — ABNORMAL HIGH (ref 22–32)

## 2017-12-21 SURGERY — A/V SHUNTOGRAM
Anesthesia: LOCAL

## 2017-12-21 MED ORDER — HEPARIN (PORCINE) IN NACL 1000-0.9 UT/500ML-% IV SOLN
INTRAVENOUS | Status: AC
  Filled 2017-12-21: qty 500

## 2017-12-21 MED ORDER — MIDAZOLAM HCL 2 MG/2ML IJ SOLN
INTRAMUSCULAR | Status: DC | PRN
  Administered 2017-12-21: 1 mg via INTRAVENOUS

## 2017-12-21 MED ORDER — HEPARIN (PORCINE) IN NACL 1000-0.9 UT/500ML-% IV SOLN
INTRAVENOUS | Status: DC | PRN
  Administered 2017-12-21: 500 mL

## 2017-12-21 MED ORDER — IODIXANOL 320 MG/ML IV SOLN
INTRAVENOUS | Status: DC | PRN
  Administered 2017-12-21: 60 mL via INTRAVENOUS

## 2017-12-21 MED ORDER — FENTANYL CITRATE (PF) 100 MCG/2ML IJ SOLN
INTRAMUSCULAR | Status: AC
  Filled 2017-12-21: qty 2

## 2017-12-21 MED ORDER — SODIUM CHLORIDE 0.9% FLUSH: 3.0000 mL | INTRAVENOUS | Status: DC | PRN

## 2017-12-21 MED ORDER — FENTANYL CITRATE (PF) 100 MCG/2ML IJ SOLN
INTRAMUSCULAR | Status: DC | PRN
  Administered 2017-12-21: 25 ug via INTRAVENOUS

## 2017-12-21 MED ORDER — LIDOCAINE HCL (PF) 1 % IJ SOLN
INTRAMUSCULAR | Status: DC | PRN
  Administered 2017-12-21: 7 mL

## 2017-12-21 MED ORDER — MIDAZOLAM HCL 2 MG/2ML IJ SOLN
INTRAMUSCULAR | Status: AC
  Filled 2017-12-21: qty 2

## 2017-12-21 MED ORDER — LIDOCAINE HCL (PF) 1 % IJ SOLN
INTRAMUSCULAR | Status: AC
  Filled 2017-12-21: qty 30

## 2017-12-21 SURGICAL SUPPLY — 17 items
BAG SNAP BAND KOVER 36X36 (MISCELLANEOUS) ×3 IMPLANT
BALLN LUTONIX AV 8X60X75 (BALLOONS) ×3
BALLN MUSTANG 10.0X40 75 (BALLOONS) ×3
BALLOON LUTONIX AV 8X60X75 (BALLOONS) ×2 IMPLANT
BALLOON MUSTANG 10.0X40 75 (BALLOONS) ×2 IMPLANT
COVER DOME SNAP 22 D (MISCELLANEOUS) ×3 IMPLANT
COVER PRB 48X5XTLSCP FOLD TPE (BAG) ×2 IMPLANT
COVER PROBE 5X48 (BAG) ×1
KIT ENCORE 26 ADVANTAGE (KITS) ×3 IMPLANT
KIT MICROPUNCTURE NIT STIFF (SHEATH) ×3 IMPLANT
PROTECTION STATION PRESSURIZED (MISCELLANEOUS) ×3
SHEATH PINNACLE R/O II 7F 4CM (SHEATH) ×3 IMPLANT
STATION PROTECTION PRESSURIZED (MISCELLANEOUS) ×2 IMPLANT
STOPCOCK MORSE 400PSI 3WAY (MISCELLANEOUS) ×3 IMPLANT
TRAY PV CATH (CUSTOM PROCEDURE TRAY) ×3 IMPLANT
TUBING CIL FLEX 10 FLL-RA (TUBING) ×3 IMPLANT
WIRE BENTSON .035X145CM (WIRE) ×3 IMPLANT

## 2017-12-21 NOTE — Interval H&P Note (Signed)
History and Physical Interval Note:  12/21/2017 8:56 AM  Rachael George  has presented today for surgery, with the diagnosis of complication w/ graft  The various methods of treatment have been discussed with the patient and family. After consideration of risks, benefits and other options for treatment, the patient has consented to  Procedure(s): A/V SHUNTOGRAM - left arm (N/A) as a surgical intervention .  The patient's history has been reviewed, patient examined, no change in status, stable for surgery.  I have reviewed the patient's chart and labs.  Questions were answered to the patient's satisfaction.     Annamarie Major

## 2017-12-21 NOTE — Discharge Instructions (Signed)

## 2017-12-21 NOTE — Op Note (Signed)
    Patient name: Rachael George MRN: [id number] DOB: 12/26/1955 Sex: female  12/21/2017 Pre-operative Diagnosis: ESRD Post-operative diagnosis:  Same Surgeon:  Annamarie Major Procedure Performed:  1.  Ultrasound-guided access, left upper arm dialysis graft  2.  Shuntogram  3.  Venoplasty (peripheral)  4.  Venoplasty (central)  5.  Conscious sedation (29 minutes)   Indications: The patient has been having difficulty with her dialysis access.  She comes in today for further evaluation.  Procedure:  The patient was identified in the holding area and taken to room 8.  The patient was then placed supine on the table and prepped and draped in the usual sterile fashion.  A time out was called.  Conscious sedation was administered with use of IV fentanyl and Versed under continuous physician and nurse monitoring.  Heart rate, blood pressure, and oxygen saturations were continuously monitored.  Ultrasound was used to evaluate the fistula.  The vein was patent and compressible.  A digital ultrasound image was acquired.  The fistula was then accessed under ultrasound guidance using a micropuncture needle.  An 018 wire was then asvanced without resistance and a micropuncture sheath was placed.  Contrast injections were then performed through the sheath.  Findings: The left upper arm loop graft is patent throughout its course.  The arterial anastomosis is widely patent.  There is narrowing at the venous outflow tract of approximately 40-50%.  The central venous system is patent however within the proximal left innominate vein there is about a 50 to 60% stenosis.   Intervention: After the above images were acquired the decision was made to proceed with intervention.  A 7 French sheath was inserted.  A Bentson wire was advanced into the superior vena cava.  I selected a 8 x 60 drug-coated Lutonix balloon and performed balloon angioplasty of the venous outflow tract taking the balloon did not atmospheres for 2  minutes.  Follow-up imaging revealed resolution of the narrowing at that area.  I then selected a 10 x 40 Mustang balloon and performed balloon angioplasty of the left innominate vein and its origin taking the balloon to 12 atm for 1 minute.  Follow-up imaging revealed no residual stenosis at this level.  At this point, catheters and wires were removed.  Sheath was removed with suture closure.  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 Annamarie Major, M.D. [facility] Office: [phone] Pager:  [phone]

## 2017-12-22 ENCOUNTER — Encounter (HOSPITAL_COMMUNITY): Payer: Self-pay | Admitting: Surgery

## 2017-12-23 ENCOUNTER — Ambulatory Visit (HOSPITAL_COMMUNITY)
Admission: RE | Admit: 2017-12-23 | Discharge: 2017-12-23 | Disposition: A | Discharge status: Home / Self Care | Payer: Medicare Other | Source: Ambulatory Visit | Attending: Gastroenterology | Admitting: Gastroenterology

## 2017-12-23 DIAGNOSIS — R1110 Vomiting, unspecified: Secondary | ICD-10-CM | POA: Diagnosis not present

## 2017-12-23 DIAGNOSIS — K219 Gastro-esophageal reflux disease without esophagitis: Secondary | ICD-10-CM | POA: Insufficient documentation

## 2017-12-23 DIAGNOSIS — R112 Nausea with vomiting, unspecified: Secondary | ICD-10-CM | POA: Diagnosis not present

## 2017-12-23 MED ORDER — TECHNETIUM TC 99M SULFUR COLLOID: 2.1100 | Freq: Once | INTRAVENOUS | Status: DC | PRN

## 2017-12-23 MED ORDER — TECHNETIUM TC 99M SULFUR COLLOID
2.1100 | Freq: Once | INTRAVENOUS | Status: AC | PRN
  Administered 2017-12-23: 2.11 via ORAL

## 2017-12-24 DIAGNOSIS — N2581 Secondary hyperparathyroidism of renal origin: Secondary | ICD-10-CM | POA: Diagnosis not present

## 2017-12-24 DIAGNOSIS — N186 End stage renal disease: Secondary | ICD-10-CM | POA: Diagnosis not present

## 2017-12-24 DIAGNOSIS — E1129 Type 2 diabetes mellitus with other diabetic kidney complication: Secondary | ICD-10-CM | POA: Diagnosis not present

## 2017-12-24 DIAGNOSIS — D631 Anemia in chronic kidney disease: Secondary | ICD-10-CM | POA: Diagnosis not present

## 2017-12-27 DIAGNOSIS — D631 Anemia in chronic kidney disease: Secondary | ICD-10-CM | POA: Diagnosis not present

## 2017-12-27 DIAGNOSIS — E1129 Type 2 diabetes mellitus with other diabetic kidney complication: Secondary | ICD-10-CM | POA: Diagnosis not present

## 2017-12-27 DIAGNOSIS — N186 End stage renal disease: Secondary | ICD-10-CM | POA: Diagnosis not present

## 2017-12-27 DIAGNOSIS — N2581 Secondary hyperparathyroidism of renal origin: Secondary | ICD-10-CM | POA: Diagnosis not present

## 2017-12-29 DIAGNOSIS — D631 Anemia in chronic kidney disease: Secondary | ICD-10-CM | POA: Diagnosis not present

## 2017-12-29 DIAGNOSIS — N186 End stage renal disease: Secondary | ICD-10-CM | POA: Diagnosis not present

## 2017-12-29 DIAGNOSIS — N2581 Secondary hyperparathyroidism of renal origin: Secondary | ICD-10-CM | POA: Diagnosis not present

## 2017-12-29 DIAGNOSIS — E1129 Type 2 diabetes mellitus with other diabetic kidney complication: Secondary | ICD-10-CM | POA: Diagnosis not present

## 2017-12-31 DIAGNOSIS — N2581 Secondary hyperparathyroidism of renal origin: Secondary | ICD-10-CM | POA: Diagnosis not present

## 2017-12-31 DIAGNOSIS — N186 End stage renal disease: Secondary | ICD-10-CM | POA: Diagnosis not present

## 2017-12-31 DIAGNOSIS — D631 Anemia in chronic kidney disease: Secondary | ICD-10-CM | POA: Diagnosis not present

## 2017-12-31 DIAGNOSIS — E1129 Type 2 diabetes mellitus with other diabetic kidney complication: Secondary | ICD-10-CM | POA: Diagnosis not present

## 2018-01-03 DIAGNOSIS — E1129 Type 2 diabetes mellitus with other diabetic kidney complication: Secondary | ICD-10-CM | POA: Diagnosis not present

## 2018-01-03 DIAGNOSIS — N186 End stage renal disease: Secondary | ICD-10-CM | POA: Diagnosis not present

## 2018-01-03 DIAGNOSIS — D631 Anemia in chronic kidney disease: Secondary | ICD-10-CM | POA: Diagnosis not present

## 2018-01-03 DIAGNOSIS — N2581 Secondary hyperparathyroidism of renal origin: Secondary | ICD-10-CM | POA: Diagnosis not present

## 2018-01-05 DIAGNOSIS — N2581 Secondary hyperparathyroidism of renal origin: Secondary | ICD-10-CM | POA: Diagnosis not present

## 2018-01-05 DIAGNOSIS — E1129 Type 2 diabetes mellitus with other diabetic kidney complication: Secondary | ICD-10-CM | POA: Diagnosis not present

## 2018-01-05 DIAGNOSIS — N186 End stage renal disease: Secondary | ICD-10-CM | POA: Diagnosis not present

## 2018-01-05 DIAGNOSIS — D631 Anemia in chronic kidney disease: Secondary | ICD-10-CM | POA: Diagnosis not present

## 2018-01-07 DIAGNOSIS — N186 End stage renal disease: Secondary | ICD-10-CM | POA: Diagnosis not present

## 2018-01-07 DIAGNOSIS — D631 Anemia in chronic kidney disease: Secondary | ICD-10-CM | POA: Diagnosis not present

## 2018-01-07 DIAGNOSIS — N2581 Secondary hyperparathyroidism of renal origin: Secondary | ICD-10-CM | POA: Diagnosis not present

## 2018-01-07 DIAGNOSIS — E1129 Type 2 diabetes mellitus with other diabetic kidney complication: Secondary | ICD-10-CM | POA: Diagnosis not present

## 2018-01-10 DIAGNOSIS — E1129 Type 2 diabetes mellitus with other diabetic kidney complication: Secondary | ICD-10-CM | POA: Diagnosis not present

## 2018-01-10 DIAGNOSIS — N186 End stage renal disease: Secondary | ICD-10-CM | POA: Diagnosis not present

## 2018-01-10 DIAGNOSIS — N2581 Secondary hyperparathyroidism of renal origin: Secondary | ICD-10-CM | POA: Diagnosis not present

## 2018-01-10 DIAGNOSIS — D631 Anemia in chronic kidney disease: Secondary | ICD-10-CM | POA: Diagnosis not present

## 2018-01-11 DIAGNOSIS — R1111 Vomiting without nausea: Secondary | ICD-10-CM | POA: Diagnosis not present

## 2018-01-14 DIAGNOSIS — N2581 Secondary hyperparathyroidism of renal origin: Secondary | ICD-10-CM | POA: Diagnosis not present

## 2018-01-14 DIAGNOSIS — N186 End stage renal disease: Secondary | ICD-10-CM | POA: Diagnosis not present

## 2018-01-14 DIAGNOSIS — D631 Anemia in chronic kidney disease: Secondary | ICD-10-CM | POA: Diagnosis not present

## 2018-01-14 DIAGNOSIS — E1129 Type 2 diabetes mellitus with other diabetic kidney complication: Secondary | ICD-10-CM | POA: Diagnosis not present

## 2018-01-17 DIAGNOSIS — D631 Anemia in chronic kidney disease: Secondary | ICD-10-CM | POA: Diagnosis not present

## 2018-01-17 DIAGNOSIS — N186 End stage renal disease: Secondary | ICD-10-CM | POA: Diagnosis not present

## 2018-01-17 DIAGNOSIS — N2581 Secondary hyperparathyroidism of renal origin: Secondary | ICD-10-CM | POA: Diagnosis not present

## 2018-01-17 DIAGNOSIS — E1129 Type 2 diabetes mellitus with other diabetic kidney complication: Secondary | ICD-10-CM | POA: Diagnosis not present

## 2018-01-19 DIAGNOSIS — D631 Anemia in chronic kidney disease: Secondary | ICD-10-CM | POA: Diagnosis not present

## 2018-01-19 DIAGNOSIS — E1129 Type 2 diabetes mellitus with other diabetic kidney complication: Secondary | ICD-10-CM | POA: Diagnosis not present

## 2018-01-19 DIAGNOSIS — N186 End stage renal disease: Secondary | ICD-10-CM | POA: Diagnosis not present

## 2018-01-19 DIAGNOSIS — N2581 Secondary hyperparathyroidism of renal origin: Secondary | ICD-10-CM | POA: Diagnosis not present

## 2018-01-21 DIAGNOSIS — E1129 Type 2 diabetes mellitus with other diabetic kidney complication: Secondary | ICD-10-CM | POA: Diagnosis not present

## 2018-01-21 DIAGNOSIS — N186 End stage renal disease: Secondary | ICD-10-CM | POA: Diagnosis not present

## 2018-01-21 DIAGNOSIS — N2581 Secondary hyperparathyroidism of renal origin: Secondary | ICD-10-CM | POA: Diagnosis not present

## 2018-01-21 DIAGNOSIS — D631 Anemia in chronic kidney disease: Secondary | ICD-10-CM | POA: Diagnosis not present

## 2018-01-24 DIAGNOSIS — N186 End stage renal disease: Secondary | ICD-10-CM | POA: Diagnosis not present

## 2018-01-24 DIAGNOSIS — N2581 Secondary hyperparathyroidism of renal origin: Secondary | ICD-10-CM | POA: Diagnosis not present

## 2018-01-24 DIAGNOSIS — D631 Anemia in chronic kidney disease: Secondary | ICD-10-CM | POA: Diagnosis not present

## 2018-01-24 DIAGNOSIS — E1129 Type 2 diabetes mellitus with other diabetic kidney complication: Secondary | ICD-10-CM | POA: Diagnosis not present

## 2018-01-26 DIAGNOSIS — E1129 Type 2 diabetes mellitus with other diabetic kidney complication: Secondary | ICD-10-CM | POA: Diagnosis not present

## 2018-01-26 DIAGNOSIS — N2581 Secondary hyperparathyroidism of renal origin: Secondary | ICD-10-CM | POA: Diagnosis not present

## 2018-01-26 DIAGNOSIS — N186 End stage renal disease: Secondary | ICD-10-CM | POA: Diagnosis not present

## 2018-01-26 DIAGNOSIS — D631 Anemia in chronic kidney disease: Secondary | ICD-10-CM | POA: Diagnosis not present

## 2018-01-28 DIAGNOSIS — E1129 Type 2 diabetes mellitus with other diabetic kidney complication: Secondary | ICD-10-CM | POA: Diagnosis not present

## 2018-01-28 DIAGNOSIS — N186 End stage renal disease: Secondary | ICD-10-CM | POA: Diagnosis not present

## 2018-01-28 DIAGNOSIS — D631 Anemia in chronic kidney disease: Secondary | ICD-10-CM | POA: Diagnosis not present

## 2018-01-28 DIAGNOSIS — N2581 Secondary hyperparathyroidism of renal origin: Secondary | ICD-10-CM | POA: Diagnosis not present

## 2018-01-31 DIAGNOSIS — D631 Anemia in chronic kidney disease: Secondary | ICD-10-CM | POA: Diagnosis not present

## 2018-01-31 DIAGNOSIS — N186 End stage renal disease: Secondary | ICD-10-CM | POA: Diagnosis not present

## 2018-01-31 DIAGNOSIS — E1129 Type 2 diabetes mellitus with other diabetic kidney complication: Secondary | ICD-10-CM | POA: Diagnosis not present

## 2018-01-31 DIAGNOSIS — N2581 Secondary hyperparathyroidism of renal origin: Secondary | ICD-10-CM | POA: Diagnosis not present

## 2018-02-02 DIAGNOSIS — N186 End stage renal disease: Secondary | ICD-10-CM | POA: Diagnosis not present

## 2018-02-02 DIAGNOSIS — N2581 Secondary hyperparathyroidism of renal origin: Secondary | ICD-10-CM | POA: Diagnosis not present

## 2018-02-02 DIAGNOSIS — D631 Anemia in chronic kidney disease: Secondary | ICD-10-CM | POA: Diagnosis not present

## 2018-02-02 DIAGNOSIS — E1129 Type 2 diabetes mellitus with other diabetic kidney complication: Secondary | ICD-10-CM | POA: Diagnosis not present

## 2018-02-04 DIAGNOSIS — D631 Anemia in chronic kidney disease: Secondary | ICD-10-CM | POA: Diagnosis not present

## 2018-02-04 DIAGNOSIS — E1129 Type 2 diabetes mellitus with other diabetic kidney complication: Secondary | ICD-10-CM | POA: Diagnosis not present

## 2018-02-04 DIAGNOSIS — N186 End stage renal disease: Secondary | ICD-10-CM | POA: Diagnosis not present

## 2018-02-04 DIAGNOSIS — N2581 Secondary hyperparathyroidism of renal origin: Secondary | ICD-10-CM | POA: Diagnosis not present

## 2018-02-08 DIAGNOSIS — E1129 Type 2 diabetes mellitus with other diabetic kidney complication: Secondary | ICD-10-CM | POA: Diagnosis not present

## 2018-02-08 DIAGNOSIS — D631 Anemia in chronic kidney disease: Secondary | ICD-10-CM | POA: Diagnosis not present

## 2018-02-08 DIAGNOSIS — N2581 Secondary hyperparathyroidism of renal origin: Secondary | ICD-10-CM | POA: Diagnosis not present

## 2018-02-08 DIAGNOSIS — N186 End stage renal disease: Secondary | ICD-10-CM | POA: Diagnosis not present

## 2018-02-11 ENCOUNTER — Other Ambulatory Visit: Payer: Self-pay

## 2018-02-11 ENCOUNTER — Encounter (HOSPITAL_COMMUNITY): Payer: Self-pay | Admitting: Pharmacy Technician

## 2018-02-11 ENCOUNTER — Emergency Department (HOSPITAL_COMMUNITY)
Admission: EM | Admit: 2018-02-11 | Discharge: 2018-02-11 | Disposition: A | Discharge status: Home / Self Care | Payer: Medicare Other | Attending: Emergency Medicine | Admitting: Emergency Medicine

## 2018-02-11 DIAGNOSIS — Y828 Other medical devices associated with adverse incidents: Secondary | ICD-10-CM | POA: Diagnosis not present

## 2018-02-11 DIAGNOSIS — E1129 Type 2 diabetes mellitus with other diabetic kidney complication: Secondary | ICD-10-CM | POA: Diagnosis not present

## 2018-02-11 DIAGNOSIS — E119 Type 2 diabetes mellitus without complications: Secondary | ICD-10-CM | POA: Diagnosis not present

## 2018-02-11 DIAGNOSIS — R58 Hemorrhage, not elsewhere classified: Secondary | ICD-10-CM

## 2018-02-11 DIAGNOSIS — I120 Hypertensive chronic kidney disease with stage 5 chronic kidney disease or end stage renal disease: Secondary | ICD-10-CM | POA: Diagnosis not present

## 2018-02-11 DIAGNOSIS — Z79899 Other long term (current) drug therapy: Secondary | ICD-10-CM | POA: Diagnosis not present

## 2018-02-11 DIAGNOSIS — N186 End stage renal disease: Secondary | ICD-10-CM | POA: Diagnosis not present

## 2018-02-11 DIAGNOSIS — T82838A Hemorrhage of vascular prosthetic devices, implants and grafts, initial encounter: Secondary | ICD-10-CM | POA: Diagnosis not present

## 2018-02-11 DIAGNOSIS — E039 Hypothyroidism, unspecified: Secondary | ICD-10-CM | POA: Diagnosis not present

## 2018-02-11 DIAGNOSIS — T82590A Other mechanical complication of surgically created arteriovenous fistula, initial encounter: Secondary | ICD-10-CM

## 2018-02-11 DIAGNOSIS — N2581 Secondary hyperparathyroidism of renal origin: Secondary | ICD-10-CM | POA: Diagnosis not present

## 2018-02-11 DIAGNOSIS — I10 Essential (primary) hypertension: Secondary | ICD-10-CM | POA: Diagnosis not present

## 2018-02-11 DIAGNOSIS — D631 Anemia in chronic kidney disease: Secondary | ICD-10-CM | POA: Diagnosis not present

## 2018-02-11 LAB — CBC WITH DIFFERENTIAL/PLATELET
Abs Immature Granulocytes: 0.08 10*3/uL — ABNORMAL HIGH (ref 0.00–0.07)
Basophils Absolute: 0 10*3/uL (ref 0.0–0.1)
Basophils Relative: 0 %
Eosinophils Absolute: 0.1 10*3/uL (ref 0.0–0.5)
Eosinophils Relative: 2 %
HCT: 30.5 % — ABNORMAL LOW (ref 36.0–46.0)
Hemoglobin: 9.7 g/dL — ABNORMAL LOW (ref 12.0–15.0)
Immature Granulocytes: 1 %
Lymphocytes Relative: 21 %
Lymphs Abs: 1.7 10*3/uL (ref 0.7–4.0)
MCH: 24.7 pg — ABNORMAL LOW (ref 26.0–34.0)
MCHC: 31.8 g/dL (ref 30.0–36.0)
MCV: 77.6 fL — ABNORMAL LOW (ref 80.0–100.0)
Monocytes Absolute: 0.5 10*3/uL (ref 0.1–1.0)
Monocytes Relative: 7 %
Neutro Abs: 5.5 10*3/uL (ref 1.7–7.7)
Neutrophils Relative %: 69 %
Platelets: 178 10*3/uL (ref 150–400)
RBC: 3.93 MIL/uL (ref 3.87–5.11)
RDW: 17.2 % — ABNORMAL HIGH (ref 11.5–15.5)
WBC: 7.9 10*3/uL (ref 4.0–10.5)
nRBC: 0 % (ref 0.0–0.2)

## 2018-02-11 LAB — COMPREHENSIVE METABOLIC PANEL
ALT: 12 U/L (ref 0–44)
AST: 19 U/L (ref 15–41)
Albumin: 3.5 g/dL (ref 3.5–5.0)
Alkaline Phosphatase: 188 U/L — ABNORMAL HIGH (ref 38–126)
Anion gap: 12 (ref 5–15)
BUN: 19 mg/dL (ref 8–23)
CO2: 30 mmol/L (ref 22–32)
Calcium: 8.1 mg/dL — ABNORMAL LOW (ref 8.9–10.3)
Chloride: 95 mmol/L — ABNORMAL LOW (ref 98–111)
Creatinine, Ser: 4.81 mg/dL — ABNORMAL HIGH (ref 0.44–1.00)
GFR calc Af Amer: 10 mL/min — ABNORMAL LOW (ref >60)
GFR calc non Af Amer: 9 mL/min — ABNORMAL LOW (ref >60)
Glucose, Bld: 139 mg/dL — ABNORMAL HIGH (ref 70–99)
Potassium: 3.1 mmol/L — ABNORMAL LOW (ref 3.5–5.1)
Sodium: 137 mmol/L (ref 135–145)
Total Bilirubin: 0.6 mg/dL (ref 0.3–1.2)
Total Protein: 7.1 g/dL (ref 6.5–8.1)

## 2018-02-11 NOTE — Discharge Instructions (Addendum)
Leave dressing on for the next 24 hours.  Follow up with your primary care doctor as needed.  Return to the ER if bleeding returns and cannot be stopped with direct pressure help for 20 minutes.  Return to the ER with any new, worsening, concerning symptoms.

## 2018-02-11 NOTE — ED Triage Notes (Signed)
Pt bib EMS from dialysis with bleeding fistula. Clamp in place to control bleeding. Per ems, pt was bleeding prior to being accessed and continued to bleed during tx. Pt received all but 40 mins of dialysis. Arrives still accessed. VSS with EMS.

## 2018-02-11 NOTE — ED Provider Notes (Signed)
Patient's hemodialysis fistula in her left upper extremity began to bleed this morning after access was attempted during hemodialysis.  She received full hemodialysis treatment except for 47 minutes per EMS.  She is presently asymptomatic.  She was treated with clamping her left arm with a plastic clamp allowing for direct pressure on the bleeding site.  On exam she is in no distress.  Left upper extremity with dialysis graft with good thrill.  No active bleeding.  Radial pulse 2+   Orlie Dakin, MD 02/11/18 1438

## 2018-02-11 NOTE — ED Provider Notes (Signed)
[facility] EMERGENCY DEPARTMENT Provider Note   CSN: [id number] Arrival date & time: 02/11/18  1205     History   Chief Complaint Chief Complaint  Patient presents with  . Fistula Bleeding    HPI Rachael George is a 62 y.o. female presenting for evaluation of bleeding from the fistula.  Patient states she was at dialysis when they tried to access her graft of her left arm.  They missed, and when he pulled with the needle patient had bleeding from the graft.  They plan to clamp for an hour, upon removal patient was still bleeding.  They clamped again for an hour, on removal she was still bleeding.  And EMS was called.  Clamp still in place.  Patient denies pain, dizziness, lightheadedness, shortness of breath, chest pain.  She is not on blood thinners.  Graft was placed in 2017 with Dr. Nechama Guard. Pt goes to dialysis m/w/f. Completed all by 45 min of dialysis today.   Additional history obtained from chart review.  Patient with a history of borderline/prediabetes, ESRD on dialysis, chronic anemia, htn.  HPI  Past Medical History:  Diagnosis Date  . Anemia   . Asthma   . ESRD (end stage renal disease) ([place])    MWF  . GERD (gastroesophageal reflux disease)   . Gout   . 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 Heavy menstrual bleeding   . High cholesterol   . History of blood transfusion 02/2016- last one   1990's- several ones   . History of kidney stones 1980's  . Hyperlipemia 12/07/2012  . Hypertension   . Hypothyroidism   . Kidney stones   . Pre-diabetes   . Prediabetes   . RA (rheumatoid arthritis) ([place])   . Sleep apnea    [place]- have CPAP, not able to use every night     Patient Active Problem List   Diagnosis Date Noted  . OSA (obstructive sleep apnea) 09/07/2017  . Cough 05/20/2017  . Periodic limb movements of sleep 03/17/2017  . Eczema 09/17/2016  . Acute blood loss anemia 03/03/2016  . Hypovolemic shock ([place])  03/03/2016  . Staphylococcus aureus bacteremia 03/03/2016  . 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[place]) 03/02/2016  . Hematoma 02/07/2015  . Chronic gout 10/05/2014  . Anemia in chronic kidney disease 12/27/2013  . Essential hypertension, benign 12/07/2012  . Type 2 diabetes mellitus ([place]) 12/07/2012  . End stage renal disease ([place]) 12/07/2012  . Low back pain 12/07/2012  . Right leg numbness 12/07/2012  . Asthma, mild intermittent 12/07/2012  . Hyperlipemia 12/07/2012  . Obesity, unspecified 12/07/2012    Past Surgical History:  Procedure Laterality Date  . A/V SHUNTOGRAM N/A 09/08/2016   Procedure: A/V Shuntogram - Left Arm AV Graft;  Surgeon: Serafina Mitchell, MD;  Location: [facility];  Service: Cardiovascular;  Laterality: N/A;  . A/V SHUNTOGRAM N/A 09/02/2017   Procedure: A/V SHUNTOGRAM - Left Arm AVG;  Surgeon: Conrad [place], MD;  Location: [facility];  Service: Cardiovascular;  Laterality: N/A;  . A/V SHUNTOGRAM N/A 12/21/2017   Procedure: A/V SHUNTOGRAM - left arm;  Surgeon: Serafina Mitchell, MD;  Location: [facility];  Service: Cardiovascular;  Laterality: N/A;  . ABDOMINAL HYSTERECTOMY  2000  . AV FISTULA PLACEMENT Left 05/25/2016   Procedure: INSERTION OF LEFT UPPER ARM ARTERIOVENOUS (AV) LOOP GORE-TEX GRAFT ARM;  Surgeon: Elam Dutch, MD;  Location:  [facility];  Service: Vascular;  Laterality: Left;  . BASCILIC VEIN TRANSPOSITION Right 12/06/2014   Procedure: FIRST STAGE BASILIC VEIN TRANSPOSITION - RIGHT;  Surgeon: Serafina Mitchell, MD;  Location: [facility];  Service: Vascular;  Laterality: Right;  . BASCILIC VEIN TRANSPOSITION Right 02/07/2015   Procedure: RIGHT ARM SECOND STAGE BASILIC VEIN TRANSPOSITION;  Surgeon: Serafina Mitchell, MD;  Location: [facility];  Service: Vascular;  Laterality: Right;  . BASCILIC VEIN TRANSPOSITION Left 04/22/2016   Procedure: FIRST STAGE BASILIC VEIN TRANSPOSITION LEFT ARM;  Surgeon: Conrad Williamston, MD;  Location: [facility];  Service:  Vascular;  Laterality: Left;  . COLONOSCOPY W/ POLYPECTOMY    . ECTOPIC PREGNANCY SURGERY  02/1982  . EXCHANGE OF A DIALYSIS CATHETER Right 04/22/2016   Procedure: EXCHANGE OF A DIALYSIS CATHETER - INSERTION RIGHT INTERNAL JUGULAR & REMOVAL FROM LEFT INTERNAL JUGULAR;  Surgeon: Conrad Heath, MD;  Location: [facility];  Service: Vascular;  Laterality: Right;  . I&D EXTREMITY Right 02/07/2015   Procedure: IRRIGATION AND DEBRIDEMENT RIGHT ARM HEMATOMA;  Surgeon: Serafina Mitchell, MD;  Location: [facility];  Service: Vascular;  Laterality: Right;  . INSERTION OF DIALYSIS CATHETER  03/03/2016   Procedure: INSERTION OF DIALYSIS CATHETER;  Surgeon: Elam Dutch, MD;  Location: [facility];  Service: Vascular;;  . IR AV DIALY SHUNT INTRO NEEDLE/INTRACATH INITIAL W/PTA/IMG LEFT  04/20/2017  . IR AV DIALY SHUNT INTRO NEEDLE/INTRACATH INITIAL W/PTA/IMG LEFT  06/24/2017  . IR THROMBECTOMY AV FISTULA W/THROMBOLYSIS/PTA INC/SHUNT/IMG LEFT Left 01/04/2017  . IR US GUIDE VASC ACCESS LEFT  01/04/2017  . left shoulder surgery  08/2005  . LIGATION OF ARTERIOVENOUS  FISTULA  03/03/2016   Procedure: LIGATION OF ARTERIOVENOUS  FISTULA;  Surgeon: Elam Dutch, MD;  Location: [facility];  Service: Vascular;;  . PERIPHERAL VASCULAR BALLOON ANGIOPLASTY  09/08/2016   Procedure: Peripheral Vascular Balloon Angioplasty;  Surgeon: Serafina Mitchell, MD;  Location: [facility];  Service: Cardiovascular;;  left avf  . PERIPHERAL VASCULAR BALLOON ANGIOPLASTY  09/02/2017   Procedure: PERIPHERAL VASCULAR BALLOON ANGIOPLASTY;  Surgeon: Conrad [place]ace, MD;  Location: [facility];  Service: Cardiovascular;;  left AV Graft  . PERIPHERAL VASCULAR BALLOON ANGIOPLASTY  12/21/2017   Procedure: PERIPHERAL VASCULAR BALLOON ANGIOPLASTY;  Surgeon: Serafina Mitchell, MD;  Location: [facility];  Service: Cardiovascular;;  INOMINATE / UPPER ARM AV GRAFT  . TEE WITHOUT CARDIOVERSION N/A 03/06/2016   Procedure: TRANSESOPHAGEAL ECHOCARDIOGRAM  (TEE);  Surgeon: Sueanne Margarita, MD;  Location: [facility];  Service: Cardiovascular;  Laterality: N/A;  . TUBAL LIGATION  11/1986     OB History   None      Home Medications    Prior to Admission medications   Medication Sig Start Date End Date Taking? Authorizing Provider  acetaminophen (TYLENOL) 500 MG tablet Take 1,000 mg by mouth every 6 (six) hours as needed for mild pain.     [provider]  albuterol (PROVENTIL) (2.5 MG/3ML) 0.083% nebulizer solution Take 3 mLs (2.5 mg total) by nebulization every 6 (six) hours as needed for wheezing or shortness of breath. ICD 10:J45.20 04/01/17   Charlott Rakes, MD  albuterol (VENTOLIN HFA) 108 (90 Base) MCG/ACT inhaler Inhale 2 puffs into the lungs every 6 (six) hours as needed for wheezing or shortness of breath. 11/23/17   Charlott Rakes, MD  allopurinol (ZYLOPRIM) 100 MG tablet Take 1.5 tablets (150 mg total) by mouth daily. Patient taking differently: Take 100 mg  by mouth 2 (two) times daily.  11/23/17   Charlott Rakes, MD  amLODipine (NORVASC) 10 MG tablet Take 1 tablet (10 mg total) by mouth at bedtime. 11/23/17   Charlott Rakes, MD  atorvastatin (LIPITOR) 40 MG tablet Take 1 tablet (40 mg total) by mouth daily. Patient taking differently: Take 40 mg by mouth at bedtime.  11/23/17   Charlott Rakes, MD  budesonide-formoterol (SYMBICORT) 160-4.5 MCG/ACT inhaler Inhale 2 puffs into the lungs 2 (two) times daily for 1 day. 09/07/17 12/16/18  Marshell Garfinkel, MD  carvedilol (COREG) 6.25 MG tablet Take 1 tablet (6.25 mg total) by mouth 2 (two) times daily with a meal. Patient taking differently: Take 6.25 mg by mouth 2 (two) times daily. 1300 & bedtime 11/23/17   Charlott Rakes, MD  chlorpheniramine (CHLOR-TRIMETON) 4 MG tablet Take 1 tablet (4 mg total) by mouth 3 (three) times daily. Patient taking differently: Take 4 mg by mouth daily at 6 (six) AM.  05/20/17   Mannam, Hart Robinsons, MD  cinacalcet (SENSIPAR) 90 MG tablet Take 90 mg by mouth  daily with supper.    [provider]  cyclobenzaprine (FLEXERIL) 10 MG tablet Take 1 tablet (10 mg total) by mouth 2 (two) times daily as needed. For back pain Patient taking differently: Take 10 mg by mouth 3 (three) times daily as needed (for back pain).  12/01/16   Charlott Rakes, MD  famotidine (PEPCID) 20 MG tablet TAKE 1 TABLET BY MOUTH TWICE A DAY 12/15/17   Charlott Rakes, MD  fluticasone (FLONASE) 50 MCG/ACT nasal spray Place 2 sprays into both nostrils daily. Patient taking differently: Place 2 sprays into both nostrils daily at 6 (six) AM.  11/23/17   Charlott Rakes, MD  gabapentin (NEURONTIN) 300 MG capsule Take 1 capsule (300 mg total) by mouth 2 (two) times daily. Patient taking differently: Take 300 mg by mouth 2 (two) times daily as needed (pain).  11/23/17   Charlott Rakes, MD  lidocaine-prilocaine (EMLA) cream Apply 1 application topically daily as needed. Prior to each dialysis session 11/03/17   [provider]  meclizine (ANTIVERT) 25 MG tablet Take 25 mg by mouth 3 (three) times daily as needed for dizziness.    [provider]  multivitamin (RENA-VIT) TABS tablet Take 1 tablet by mouth daily.    [provider]  pantoprazole (PROTONIX) 40 MG tablet Take 1 tablet (40 mg total) by mouth 2 (two) times daily. Patient taking differently: Take 40 mg by mouth at bedtime.  11/23/17   Charlott Rakes, MD  RENVELA 800 MG tablet Take 800-2,400 mg by mouth See admin instructions. Take 3 tablets (2400 mg) by mouth with each meal & take 1 tablet (800 mg) by mouth with each snack 11/03/17   [provider]  triamcinolone cream (KENALOG) 0.1 % Apply 1 application topically 2 (two) times daily. Applied to eczematous lesions Patient taking differently: Apply 1 application topically 2 (two) times daily as needed (for eczema).  09/17/16   Charlott Rakes, MD    Family History Family History  Problem Relation Age of Onset  . Hypertension Mother   .  Breast cancer Maternal Aunt   . Deep vein thrombosis Daughter   . Hyperlipidemia Daughter   . Hypertension Daughter   . Prostate cancer Maternal Grandmother     Social History Social History   Tobacco Use  . Smoking status: Never Smoker  . Smokeless tobacco: Never Used  Substance Use Topics  . Alcohol use: Yes    Alcohol/week:  0.0 standard drinks    Comment: occ - 1 drink rarely  . Drug use: No     Allergies   Atacand [candesartan]; Lisinopril; Nsaids; and Penicillins   Review of Systems Review of Systems  Skin: Positive for wound (bleeding from dilaysis graft ).  All other systems reviewed and are negative.    Physical Exam Updated Vital Signs BP (!) 151/74   Pulse 88   Temp 98.7 F (37.1 C) (Oral)   Resp 20   SpO2 98%   Physical Exam  Constitutional: She is oriented to person, place, and time. She appears well-developed and well-nourished. No distress.  Sitting comfortably in the bed in no acute distress  HENT:  Head: Normocephalic and atraumatic.  Eyes: Pupils are equal, round, and reactive to light. Conjunctivae and EOM are normal.  Neck: Normal range of motion. Neck supple.  Cardiovascular: Normal rate, regular rhythm and intact distal pulses.  Pulmonary/Chest: Effort normal and breath sounds normal. No respiratory distress. She has no wheezes.  Abdominal: Soft. She exhibits no distension. There is no tenderness.  Musculoskeletal: Normal range of motion.  Strength intact bilaterally.  Radial pulses equal bilaterally.  Good cap refill.  Color and warmth of the hands equal.  Neurological: She is alert and oriented to person, place, and time. No sensory deficit.  Skin: Skin is warm and dry. Capillary refill takes less than 2 seconds.  On removal of the clamp, patient without active bleeding from graft. palpable thrill over graft. No surrounding contusions, erythema or swelling.   Psychiatric: She has a normal mood and affect.  Nursing note and vitals  reviewed.    ED Treatments / Results  Labs (all labs ordered are listed, but only abnormal results are displayed) Labs Reviewed  CBC WITH DIFFERENTIAL/PLATELET - Abnormal; Notable for the following components:      Result Value   Hemoglobin 9.7 (*)    HCT 30.5 (*)    MCV 77.6 (*)    MCH 24.7 (*)    RDW 17.2 (*)    Abs Immature Granulocytes 0.08 (*)    All other components within normal limits  COMPREHENSIVE METABOLIC PANEL - Abnormal; Notable for the following components:   Potassium 3.1 (*)    Chloride 95 (*)    Glucose, Bld 139 (*)    Creatinine, Ser 4.81 (*)    Calcium 8.1 (*)    Alkaline Phosphatase 188 (*)    GFR calc non Af Amer 9 (*)    GFR calc Af Amer 10 (*)    All other components within normal limits    EKG None  Radiology No results found.  Procedures Procedures (including critical care time)  Medications Ordered in ED Medications - No data to display   Initial Impression / Assessment and Plan / ED Course  I have reviewed the triage vital signs and the nursing notes.  Pertinent labs & imaging results that were available during my care of the patient were reviewed by me and considered in my medical decision making (see chart for details).     Pt presenting for evaluation of bleeding from her analysis graft.  Physical exam reassuring, vitals are stable.  She is not tachycardic or hypotensive.  Doubt significant blood loss.  Patient without symptoms of anemia including lightheadedness, dizziness, chest pain, shortness of breath.  Will obtain basic blood work for evaluation.  IV team called to de-access graft.  Discussed with attending, Dr. Winfred Leeds evaluated the patient.  On removal of the clamp  and bandage, patient without any further bleeding.  Discussed care instructions, including return if bleeding recurs.  Patient to follow-up with her PCP for further evaluation.  At this time, patient appears safe for discharge.  Return precautions given.  Patient  states she understands and agrees plan.   Final Clinical Impressions(s) / ED Diagnoses   Final diagnoses:  Bleeding  Malfunction of arteriovenous dialysis fistula, initial encounter [facility])    ED Discharge Orders    None       Franchot Heidelberg, PA-C 02/11/18 1842    Orlie Dakin, MD 02/14/18 [phone]

## 2018-02-13 DIAGNOSIS — N2581 Secondary hyperparathyroidism of renal origin: Secondary | ICD-10-CM | POA: Diagnosis not present

## 2018-02-13 DIAGNOSIS — D631 Anemia in chronic kidney disease: Secondary | ICD-10-CM | POA: Diagnosis not present

## 2018-02-13 DIAGNOSIS — N186 End stage renal disease: Secondary | ICD-10-CM | POA: Diagnosis not present

## 2018-02-13 DIAGNOSIS — E1129 Type 2 diabetes mellitus with other diabetic kidney complication: Secondary | ICD-10-CM | POA: Diagnosis not present

## 2018-02-14 ENCOUNTER — Other Ambulatory Visit: Payer: Self-pay | Admitting: Family Medicine

## 2018-02-14 DIAGNOSIS — I10 Essential (primary) hypertension: Secondary | ICD-10-CM

## 2018-02-16 DIAGNOSIS — N186 End stage renal disease: Secondary | ICD-10-CM | POA: Diagnosis not present

## 2018-02-16 DIAGNOSIS — E1129 Type 2 diabetes mellitus with other diabetic kidney complication: Secondary | ICD-10-CM | POA: Diagnosis not present

## 2018-02-16 DIAGNOSIS — D631 Anemia in chronic kidney disease: Secondary | ICD-10-CM | POA: Diagnosis not present

## 2018-02-16 DIAGNOSIS — N2581 Secondary hyperparathyroidism of renal origin: Secondary | ICD-10-CM | POA: Diagnosis not present

## 2018-02-18 DIAGNOSIS — N186 End stage renal disease: Secondary | ICD-10-CM | POA: Diagnosis not present

## 2018-02-18 DIAGNOSIS — D631 Anemia in chronic kidney disease: Secondary | ICD-10-CM | POA: Diagnosis not present

## 2018-02-18 DIAGNOSIS — N2581 Secondary hyperparathyroidism of renal origin: Secondary | ICD-10-CM | POA: Diagnosis not present

## 2018-02-18 DIAGNOSIS — E1129 Type 2 diabetes mellitus with other diabetic kidney complication: Secondary | ICD-10-CM | POA: Diagnosis not present

## 2018-02-20 DIAGNOSIS — I129 Hypertensive chronic kidney disease with stage 1 through stage 4 chronic kidney disease, or unspecified chronic kidney disease: Secondary | ICD-10-CM | POA: Diagnosis not present

## 2018-02-20 DIAGNOSIS — N186 End stage renal disease: Secondary | ICD-10-CM | POA: Diagnosis not present

## 2018-02-20 DIAGNOSIS — Z992 Dependence on renal dialysis: Secondary | ICD-10-CM | POA: Diagnosis not present

## 2018-02-21 DIAGNOSIS — N2581 Secondary hyperparathyroidism of renal origin: Secondary | ICD-10-CM | POA: Diagnosis not present

## 2018-02-21 DIAGNOSIS — N186 End stage renal disease: Secondary | ICD-10-CM | POA: Diagnosis not present

## 2018-02-21 DIAGNOSIS — D631 Anemia in chronic kidney disease: Secondary | ICD-10-CM | POA: Diagnosis not present

## 2018-02-21 DIAGNOSIS — D509 Iron deficiency anemia, unspecified: Secondary | ICD-10-CM | POA: Diagnosis not present

## 2018-02-21 DIAGNOSIS — E1129 Type 2 diabetes mellitus with other diabetic kidney complication: Secondary | ICD-10-CM | POA: Diagnosis not present

## 2018-02-23 DIAGNOSIS — E1129 Type 2 diabetes mellitus with other diabetic kidney complication: Secondary | ICD-10-CM | POA: Diagnosis not present

## 2018-02-23 DIAGNOSIS — N2581 Secondary hyperparathyroidism of renal origin: Secondary | ICD-10-CM | POA: Diagnosis not present

## 2018-02-23 DIAGNOSIS — N186 End stage renal disease: Secondary | ICD-10-CM | POA: Diagnosis not present

## 2018-02-23 DIAGNOSIS — D631 Anemia in chronic kidney disease: Secondary | ICD-10-CM | POA: Diagnosis not present

## 2018-02-23 DIAGNOSIS — D509 Iron deficiency anemia, unspecified: Secondary | ICD-10-CM | POA: Diagnosis not present

## 2018-02-25 DIAGNOSIS — N186 End stage renal disease: Secondary | ICD-10-CM | POA: Diagnosis not present

## 2018-02-25 DIAGNOSIS — E1129 Type 2 diabetes mellitus with other diabetic kidney complication: Secondary | ICD-10-CM | POA: Diagnosis not present

## 2018-02-25 DIAGNOSIS — N2581 Secondary hyperparathyroidism of renal origin: Secondary | ICD-10-CM | POA: Diagnosis not present

## 2018-02-25 DIAGNOSIS — D631 Anemia in chronic kidney disease: Secondary | ICD-10-CM | POA: Diagnosis not present

## 2018-02-25 DIAGNOSIS — D509 Iron deficiency anemia, unspecified: Secondary | ICD-10-CM | POA: Diagnosis not present

## 2018-02-28 DIAGNOSIS — N186 End stage renal disease: Secondary | ICD-10-CM | POA: Diagnosis not present

## 2018-02-28 DIAGNOSIS — E1129 Type 2 diabetes mellitus with other diabetic kidney complication: Secondary | ICD-10-CM | POA: Diagnosis not present

## 2018-02-28 DIAGNOSIS — D509 Iron deficiency anemia, unspecified: Secondary | ICD-10-CM | POA: Diagnosis not present

## 2018-02-28 DIAGNOSIS — N2581 Secondary hyperparathyroidism of renal origin: Secondary | ICD-10-CM | POA: Diagnosis not present

## 2018-02-28 DIAGNOSIS — D631 Anemia in chronic kidney disease: Secondary | ICD-10-CM | POA: Diagnosis not present

## 2018-03-02 ENCOUNTER — Other Ambulatory Visit: Payer: Self-pay | Admitting: Pulmonary Disease

## 2018-03-02 DIAGNOSIS — D509 Iron deficiency anemia, unspecified: Secondary | ICD-10-CM | POA: Diagnosis not present

## 2018-03-02 DIAGNOSIS — E1129 Type 2 diabetes mellitus with other diabetic kidney complication: Secondary | ICD-10-CM | POA: Diagnosis not present

## 2018-03-02 DIAGNOSIS — N2581 Secondary hyperparathyroidism of renal origin: Secondary | ICD-10-CM | POA: Diagnosis not present

## 2018-03-02 DIAGNOSIS — D631 Anemia in chronic kidney disease: Secondary | ICD-10-CM | POA: Diagnosis not present

## 2018-03-02 DIAGNOSIS — N186 End stage renal disease: Secondary | ICD-10-CM | POA: Diagnosis not present

## 2018-03-02 MED ORDER — BUDESONIDE-FORMOTEROL FUMARATE 160-4.5 MCG/ACT IN AERO: 2.0000 | INHALATION_SPRAY | Freq: Two times a day (BID) | RESPIRATORY_TRACT | 2 refills | Status: DC

## 2018-03-04 DIAGNOSIS — E1129 Type 2 diabetes mellitus with other diabetic kidney complication: Secondary | ICD-10-CM | POA: Diagnosis not present

## 2018-03-04 DIAGNOSIS — N186 End stage renal disease: Secondary | ICD-10-CM | POA: Diagnosis not present

## 2018-03-04 DIAGNOSIS — D509 Iron deficiency anemia, unspecified: Secondary | ICD-10-CM | POA: Diagnosis not present

## 2018-03-04 DIAGNOSIS — D631 Anemia in chronic kidney disease: Secondary | ICD-10-CM | POA: Diagnosis not present

## 2018-03-04 DIAGNOSIS — N2581 Secondary hyperparathyroidism of renal origin: Secondary | ICD-10-CM | POA: Diagnosis not present

## 2018-03-07 DIAGNOSIS — N186 End stage renal disease: Secondary | ICD-10-CM | POA: Diagnosis not present

## 2018-03-07 DIAGNOSIS — D509 Iron deficiency anemia, unspecified: Secondary | ICD-10-CM | POA: Diagnosis not present

## 2018-03-07 DIAGNOSIS — E1129 Type 2 diabetes mellitus with other diabetic kidney complication: Secondary | ICD-10-CM | POA: Diagnosis not present

## 2018-03-07 DIAGNOSIS — N2581 Secondary hyperparathyroidism of renal origin: Secondary | ICD-10-CM | POA: Diagnosis not present

## 2018-03-07 DIAGNOSIS — D631 Anemia in chronic kidney disease: Secondary | ICD-10-CM | POA: Diagnosis not present

## 2018-03-09 DIAGNOSIS — D509 Iron deficiency anemia, unspecified: Secondary | ICD-10-CM | POA: Diagnosis not present

## 2018-03-09 DIAGNOSIS — E1129 Type 2 diabetes mellitus with other diabetic kidney complication: Secondary | ICD-10-CM | POA: Diagnosis not present

## 2018-03-09 DIAGNOSIS — N186 End stage renal disease: Secondary | ICD-10-CM | POA: Diagnosis not present

## 2018-03-09 DIAGNOSIS — N2581 Secondary hyperparathyroidism of renal origin: Secondary | ICD-10-CM | POA: Diagnosis not present

## 2018-03-09 DIAGNOSIS — D631 Anemia in chronic kidney disease: Secondary | ICD-10-CM | POA: Diagnosis not present

## 2018-03-11 DIAGNOSIS — N2581 Secondary hyperparathyroidism of renal origin: Secondary | ICD-10-CM | POA: Diagnosis not present

## 2018-03-11 DIAGNOSIS — D631 Anemia in chronic kidney disease: Secondary | ICD-10-CM | POA: Diagnosis not present

## 2018-03-11 DIAGNOSIS — N186 End stage renal disease: Secondary | ICD-10-CM | POA: Diagnosis not present

## 2018-03-11 DIAGNOSIS — D509 Iron deficiency anemia, unspecified: Secondary | ICD-10-CM | POA: Diagnosis not present

## 2018-03-11 DIAGNOSIS — E1129 Type 2 diabetes mellitus with other diabetic kidney complication: Secondary | ICD-10-CM | POA: Diagnosis not present

## 2018-03-13 DIAGNOSIS — D631 Anemia in chronic kidney disease: Secondary | ICD-10-CM | POA: Diagnosis not present

## 2018-03-13 DIAGNOSIS — E1129 Type 2 diabetes mellitus with other diabetic kidney complication: Secondary | ICD-10-CM | POA: Diagnosis not present

## 2018-03-13 DIAGNOSIS — N186 End stage renal disease: Secondary | ICD-10-CM | POA: Diagnosis not present

## 2018-03-13 DIAGNOSIS — N2581 Secondary hyperparathyroidism of renal origin: Secondary | ICD-10-CM | POA: Diagnosis not present

## 2018-03-13 DIAGNOSIS — D509 Iron deficiency anemia, unspecified: Secondary | ICD-10-CM | POA: Diagnosis not present

## 2018-03-15 ENCOUNTER — Other Ambulatory Visit: Payer: Self-pay | Admitting: Family Medicine

## 2018-03-15 DIAGNOSIS — J4520 Mild intermittent asthma, uncomplicated: Secondary | ICD-10-CM

## 2018-03-18 DIAGNOSIS — D631 Anemia in chronic kidney disease: Secondary | ICD-10-CM | POA: Diagnosis not present

## 2018-03-18 DIAGNOSIS — N186 End stage renal disease: Secondary | ICD-10-CM | POA: Diagnosis not present

## 2018-03-18 DIAGNOSIS — E1129 Type 2 diabetes mellitus with other diabetic kidney complication: Secondary | ICD-10-CM | POA: Diagnosis not present

## 2018-03-18 DIAGNOSIS — D509 Iron deficiency anemia, unspecified: Secondary | ICD-10-CM | POA: Diagnosis not present

## 2018-03-18 DIAGNOSIS — N2581 Secondary hyperparathyroidism of renal origin: Secondary | ICD-10-CM | POA: Diagnosis not present

## 2018-03-20 DIAGNOSIS — N186 End stage renal disease: Secondary | ICD-10-CM | POA: Diagnosis not present

## 2018-03-20 DIAGNOSIS — E1129 Type 2 diabetes mellitus with other diabetic kidney complication: Secondary | ICD-10-CM | POA: Diagnosis not present

## 2018-03-20 DIAGNOSIS — D631 Anemia in chronic kidney disease: Secondary | ICD-10-CM | POA: Diagnosis not present

## 2018-03-20 DIAGNOSIS — N2581 Secondary hyperparathyroidism of renal origin: Secondary | ICD-10-CM | POA: Diagnosis not present

## 2018-03-20 DIAGNOSIS — D509 Iron deficiency anemia, unspecified: Secondary | ICD-10-CM | POA: Diagnosis not present

## 2018-03-22 DIAGNOSIS — E1129 Type 2 diabetes mellitus with other diabetic kidney complication: Secondary | ICD-10-CM | POA: Diagnosis not present

## 2018-03-22 DIAGNOSIS — N2581 Secondary hyperparathyroidism of renal origin: Secondary | ICD-10-CM | POA: Diagnosis not present

## 2018-03-22 DIAGNOSIS — D631 Anemia in chronic kidney disease: Secondary | ICD-10-CM | POA: Diagnosis not present

## 2018-03-22 DIAGNOSIS — D509 Iron deficiency anemia, unspecified: Secondary | ICD-10-CM | POA: Diagnosis not present

## 2018-03-22 DIAGNOSIS — N186 End stage renal disease: Secondary | ICD-10-CM | POA: Diagnosis not present

## 2018-03-23 DIAGNOSIS — Z992 Dependence on renal dialysis: Secondary | ICD-10-CM | POA: Diagnosis not present

## 2018-03-23 DIAGNOSIS — I129 Hypertensive chronic kidney disease with stage 1 through stage 4 chronic kidney disease, or unspecified chronic kidney disease: Secondary | ICD-10-CM | POA: Diagnosis not present

## 2018-03-23 DIAGNOSIS — N186 End stage renal disease: Secondary | ICD-10-CM | POA: Diagnosis not present

## 2018-03-25 DIAGNOSIS — N186 End stage renal disease: Secondary | ICD-10-CM | POA: Diagnosis not present

## 2018-03-25 DIAGNOSIS — N2581 Secondary hyperparathyroidism of renal origin: Secondary | ICD-10-CM | POA: Diagnosis not present

## 2018-03-25 DIAGNOSIS — E1129 Type 2 diabetes mellitus with other diabetic kidney complication: Secondary | ICD-10-CM | POA: Diagnosis not present

## 2018-03-25 DIAGNOSIS — D631 Anemia in chronic kidney disease: Secondary | ICD-10-CM | POA: Diagnosis not present

## 2018-03-30 DIAGNOSIS — N186 End stage renal disease: Secondary | ICD-10-CM | POA: Diagnosis not present

## 2018-03-30 DIAGNOSIS — E1129 Type 2 diabetes mellitus with other diabetic kidney complication: Secondary | ICD-10-CM | POA: Diagnosis not present

## 2018-03-30 DIAGNOSIS — N2581 Secondary hyperparathyroidism of renal origin: Secondary | ICD-10-CM | POA: Diagnosis not present

## 2018-03-30 DIAGNOSIS — D631 Anemia in chronic kidney disease: Secondary | ICD-10-CM | POA: Diagnosis not present

## 2018-04-01 DIAGNOSIS — N2581 Secondary hyperparathyroidism of renal origin: Secondary | ICD-10-CM | POA: Diagnosis not present

## 2018-04-01 DIAGNOSIS — D631 Anemia in chronic kidney disease: Secondary | ICD-10-CM | POA: Diagnosis not present

## 2018-04-01 DIAGNOSIS — E1129 Type 2 diabetes mellitus with other diabetic kidney complication: Secondary | ICD-10-CM | POA: Diagnosis not present

## 2018-04-01 DIAGNOSIS — N186 End stage renal disease: Secondary | ICD-10-CM | POA: Diagnosis not present

## 2018-04-06 DIAGNOSIS — N186 End stage renal disease: Secondary | ICD-10-CM | POA: Diagnosis not present

## 2018-04-06 DIAGNOSIS — D631 Anemia in chronic kidney disease: Secondary | ICD-10-CM | POA: Diagnosis not present

## 2018-04-06 DIAGNOSIS — E1129 Type 2 diabetes mellitus with other diabetic kidney complication: Secondary | ICD-10-CM | POA: Diagnosis not present

## 2018-04-06 DIAGNOSIS — N2581 Secondary hyperparathyroidism of renal origin: Secondary | ICD-10-CM | POA: Diagnosis not present

## 2018-04-08 DIAGNOSIS — N2581 Secondary hyperparathyroidism of renal origin: Secondary | ICD-10-CM | POA: Diagnosis not present

## 2018-04-08 DIAGNOSIS — D631 Anemia in chronic kidney disease: Secondary | ICD-10-CM | POA: Diagnosis not present

## 2018-04-08 DIAGNOSIS — E1129 Type 2 diabetes mellitus with other diabetic kidney complication: Secondary | ICD-10-CM | POA: Diagnosis not present

## 2018-04-08 DIAGNOSIS — N186 End stage renal disease: Secondary | ICD-10-CM | POA: Diagnosis not present

## 2018-04-11 DIAGNOSIS — N2581 Secondary hyperparathyroidism of renal origin: Secondary | ICD-10-CM | POA: Diagnosis not present

## 2018-04-11 DIAGNOSIS — E1129 Type 2 diabetes mellitus with other diabetic kidney complication: Secondary | ICD-10-CM | POA: Diagnosis not present

## 2018-04-11 DIAGNOSIS — N186 End stage renal disease: Secondary | ICD-10-CM | POA: Diagnosis not present

## 2018-04-11 DIAGNOSIS — D631 Anemia in chronic kidney disease: Secondary | ICD-10-CM | POA: Diagnosis not present

## 2018-04-13 DIAGNOSIS — E1129 Type 2 diabetes mellitus with other diabetic kidney complication: Secondary | ICD-10-CM | POA: Diagnosis not present

## 2018-04-13 DIAGNOSIS — D631 Anemia in chronic kidney disease: Secondary | ICD-10-CM | POA: Diagnosis not present

## 2018-04-13 DIAGNOSIS — N2581 Secondary hyperparathyroidism of renal origin: Secondary | ICD-10-CM | POA: Diagnosis not present

## 2018-04-13 DIAGNOSIS — N186 End stage renal disease: Secondary | ICD-10-CM | POA: Diagnosis not present

## 2018-04-15 DIAGNOSIS — E1129 Type 2 diabetes mellitus with other diabetic kidney complication: Secondary | ICD-10-CM | POA: Diagnosis not present

## 2018-04-15 DIAGNOSIS — N186 End stage renal disease: Secondary | ICD-10-CM | POA: Diagnosis not present

## 2018-04-15 DIAGNOSIS — N2581 Secondary hyperparathyroidism of renal origin: Secondary | ICD-10-CM | POA: Diagnosis not present

## 2018-04-15 DIAGNOSIS — D631 Anemia in chronic kidney disease: Secondary | ICD-10-CM | POA: Diagnosis not present

## 2018-04-18 DIAGNOSIS — N2581 Secondary hyperparathyroidism of renal origin: Secondary | ICD-10-CM | POA: Diagnosis not present

## 2018-04-18 DIAGNOSIS — E1129 Type 2 diabetes mellitus with other diabetic kidney complication: Secondary | ICD-10-CM | POA: Diagnosis not present

## 2018-04-18 DIAGNOSIS — N186 End stage renal disease: Secondary | ICD-10-CM | POA: Diagnosis not present

## 2018-04-18 DIAGNOSIS — D631 Anemia in chronic kidney disease: Secondary | ICD-10-CM | POA: Diagnosis not present

## 2018-04-20 DIAGNOSIS — E1129 Type 2 diabetes mellitus with other diabetic kidney complication: Secondary | ICD-10-CM | POA: Diagnosis not present

## 2018-04-20 DIAGNOSIS — N2581 Secondary hyperparathyroidism of renal origin: Secondary | ICD-10-CM | POA: Diagnosis not present

## 2018-04-20 DIAGNOSIS — N186 End stage renal disease: Secondary | ICD-10-CM | POA: Diagnosis not present

## 2018-04-20 DIAGNOSIS — D631 Anemia in chronic kidney disease: Secondary | ICD-10-CM | POA: Diagnosis not present

## 2018-04-22 DIAGNOSIS — D631 Anemia in chronic kidney disease: Secondary | ICD-10-CM | POA: Diagnosis not present

## 2018-04-22 DIAGNOSIS — N186 End stage renal disease: Secondary | ICD-10-CM | POA: Diagnosis not present

## 2018-04-22 DIAGNOSIS — E1129 Type 2 diabetes mellitus with other diabetic kidney complication: Secondary | ICD-10-CM | POA: Diagnosis not present

## 2018-04-22 DIAGNOSIS — N2581 Secondary hyperparathyroidism of renal origin: Secondary | ICD-10-CM | POA: Diagnosis not present

## 2018-04-23 DIAGNOSIS — Z992 Dependence on renal dialysis: Secondary | ICD-10-CM | POA: Diagnosis not present

## 2018-04-23 DIAGNOSIS — I129 Hypertensive chronic kidney disease with stage 1 through stage 4 chronic kidney disease, or unspecified chronic kidney disease: Secondary | ICD-10-CM | POA: Diagnosis not present

## 2018-04-23 DIAGNOSIS — N186 End stage renal disease: Secondary | ICD-10-CM | POA: Diagnosis not present

## 2018-04-24 ENCOUNTER — Other Ambulatory Visit: Payer: Self-pay | Admitting: Family Medicine

## 2018-04-25 DIAGNOSIS — N186 End stage renal disease: Secondary | ICD-10-CM | POA: Diagnosis not present

## 2018-04-25 DIAGNOSIS — D631 Anemia in chronic kidney disease: Secondary | ICD-10-CM | POA: Diagnosis not present

## 2018-04-25 DIAGNOSIS — E1129 Type 2 diabetes mellitus with other diabetic kidney complication: Secondary | ICD-10-CM | POA: Diagnosis not present

## 2018-04-25 DIAGNOSIS — N2581 Secondary hyperparathyroidism of renal origin: Secondary | ICD-10-CM | POA: Diagnosis not present

## 2018-04-27 DIAGNOSIS — N186 End stage renal disease: Secondary | ICD-10-CM | POA: Diagnosis not present

## 2018-04-27 DIAGNOSIS — N2581 Secondary hyperparathyroidism of renal origin: Secondary | ICD-10-CM | POA: Diagnosis not present

## 2018-04-27 DIAGNOSIS — E1129 Type 2 diabetes mellitus with other diabetic kidney complication: Secondary | ICD-10-CM | POA: Diagnosis not present

## 2018-04-27 DIAGNOSIS — D631 Anemia in chronic kidney disease: Secondary | ICD-10-CM | POA: Diagnosis not present

## 2018-04-29 DIAGNOSIS — D631 Anemia in chronic kidney disease: Secondary | ICD-10-CM | POA: Diagnosis not present

## 2018-04-29 DIAGNOSIS — N186 End stage renal disease: Secondary | ICD-10-CM | POA: Diagnosis not present

## 2018-04-29 DIAGNOSIS — N2581 Secondary hyperparathyroidism of renal origin: Secondary | ICD-10-CM | POA: Diagnosis not present

## 2018-04-29 DIAGNOSIS — E1129 Type 2 diabetes mellitus with other diabetic kidney complication: Secondary | ICD-10-CM | POA: Diagnosis not present

## 2018-05-02 DIAGNOSIS — D631 Anemia in chronic kidney disease: Secondary | ICD-10-CM | POA: Diagnosis not present

## 2018-05-02 DIAGNOSIS — E1129 Type 2 diabetes mellitus with other diabetic kidney complication: Secondary | ICD-10-CM | POA: Diagnosis not present

## 2018-05-02 DIAGNOSIS — N186 End stage renal disease: Secondary | ICD-10-CM | POA: Diagnosis not present

## 2018-05-02 DIAGNOSIS — N2581 Secondary hyperparathyroidism of renal origin: Secondary | ICD-10-CM | POA: Diagnosis not present

## 2018-05-04 DIAGNOSIS — E1129 Type 2 diabetes mellitus with other diabetic kidney complication: Secondary | ICD-10-CM | POA: Diagnosis not present

## 2018-05-04 DIAGNOSIS — N2581 Secondary hyperparathyroidism of renal origin: Secondary | ICD-10-CM | POA: Diagnosis not present

## 2018-05-04 DIAGNOSIS — N186 End stage renal disease: Secondary | ICD-10-CM | POA: Diagnosis not present

## 2018-05-04 DIAGNOSIS — D631 Anemia in chronic kidney disease: Secondary | ICD-10-CM | POA: Diagnosis not present

## 2018-05-04 NOTE — Progress Notes (Signed)
HISTORY AND PHYSICAL     CC:  dialysis access Requesting Provider:  Charlott Rakes, MD  HPI: This is a 63 y.o. female here for evaluation of her hemodialysis access.  Her access is LUA loop graft 4-7 that was placed 06/04/2016 by Dr. Oneida Alar.  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 -venoplasty (peripheral and central) 12/21/17  She presents today for evaluation of her access.  She states that back in December when they stuck the posterior portion of the graft, she bled with a puddle of blood on the floor.  They tried again at another session and she bled again and she is referred today for this.  She states they are able to stick the anterior portion of the graft without difficulty.  She does not have pain or numbness in her left hand.  The pt is left hand dominant.   Pt is on dialysis M/W/F at the center on [address]  If pt on Dialysis:   Dialysis days/center:  [facility]  The pt is on a statin for cholesterol management.  The pt is not diabetic.   The pt is on CCB and BB for hypertension.   Tobacco hx:  never The pt is not on a daily aspirin. Other AC:  none  Past Medical History:  Diagnosis Date  . Anemia   . Asthma   . ESRD (end stage renal disease) ([place])    MWF  . GERD (gastroesophageal reflux disease)   . Gout   . 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 Heavy menstrual bleeding   . High cholesterol   . History of blood transfusion 02/2016- last one   1990's- several ones   . History of kidney stones 1980's  . Hyperlipemia 12/07/2012  . Hypertension   . Hypothyroidism   . Kidney stones   . Pre-diabetes   . Prediabetes   . RA (rheumatoid arthritis) ([place])   . Sleep apnea    [place]- have CPAP, not able to use every night     Past Surgical History:  Procedure  Laterality Date  . A/V SHUNTOGRAM N/A 09/08/2016   Procedure: A/V Shuntogram - Left Arm AV Graft;  Surgeon: Serafina Mitchell, MD;  Location: [facility];  Service: Cardiovascular;  Laterality: N/A;  . A/V SHUNTOGRAM N/A 09/02/2017   Procedure: A/V SHUNTOGRAM - Left Arm AVG;  Surgeon: Conrad [place], MD;  Location: [facility];  Service: Cardiovascular;  Laterality: N/A;  . A/V SHUNTOGRAM N/A 12/21/2017   Procedure: A/V SHUNTOGRAM - left arm;  Surgeon: Serafina Mitchell, MD;  Location: [facility];  Service: Cardiovascular;  Laterality: N/A;  . ABDOMINAL HYSTERECTOMY  2000  . AV FISTULA PLACEMENT Left 05/25/2016   Procedure: INSERTION OF LEFT UPPER ARM ARTERIOVENOUS (AV) LOOP GORE-TEX GRAFT ARM;  Surgeon: Elam Dutch, MD;  Location: [facility];  Service: Vascular;  Laterality: Left;  . BASCILIC VEIN TRANSPOSITION Right 12/06/2014   Procedure: FIRST STAGE BASILIC VEIN TRANSPOSITION - RIGHT;  Surgeon: Serafina Mitchell, MD;  Location: [facility];  Service: Vascular;  Laterality: Right;  . BASCILIC VEIN TRANSPOSITION Right 02/07/2015   Procedure: RIGHT ARM SECOND STAGE BASILIC VEIN TRANSPOSITION;  Surgeon: Serafina Mitchell, MD;  Location: [facility];  Service: Vascular;  Laterality: Right;  .  BASCILIC VEIN TRANSPOSITION Left 04/22/2016   Procedure: FIRST STAGE BASILIC VEIN TRANSPOSITION LEFT ARM;  Surgeon: Conrad Lapeer, MD;  Location: [facility];  Service: Vascular;  Laterality: Left;  . COLONOSCOPY W/ POLYPECTOMY    . ECTOPIC PREGNANCY SURGERY  02/1982  . EXCHANGE OF A DIALYSIS CATHETER Right 04/22/2016   Procedure: EXCHANGE OF A DIALYSIS CATHETER - INSERTION RIGHT INTERNAL JUGULAR & REMOVAL FROM LEFT INTERNAL JUGULAR;  Surgeon: Conrad Buras, MD;  Location: [facility];  Service: Vascular;  Laterality: Right;  . I&D EXTREMITY Right 02/07/2015   Procedure: IRRIGATION AND DEBRIDEMENT RIGHT ARM HEMATOMA;  Surgeon: Serafina Mitchell, MD;  Location: [facility];  Service: Vascular;  Laterality: Right;  . INSERTION OF  DIALYSIS CATHETER  03/03/2016   Procedure: INSERTION OF DIALYSIS CATHETER;  Surgeon: Elam Dutch, MD;  Location: [facility];  Service: Vascular;;  . IR AV DIALY SHUNT INTRO NEEDLE/INTRACATH INITIAL W/PTA/IMG LEFT  04/20/2017  . IR AV DIALY SHUNT INTRO NEEDLE/INTRACATH INITIAL W/PTA/IMG LEFT  06/24/2017  . IR THROMBECTOMY AV FISTULA W/THROMBOLYSIS/PTA INC/SHUNT/IMG LEFT Left 01/04/2017  . IR US GUIDE VASC ACCESS LEFT  01/04/2017  . left shoulder surgery  08/2005  . LIGATION OF ARTERIOVENOUS  FISTULA  03/03/2016   Procedure: LIGATION OF ARTERIOVENOUS  FISTULA;  Surgeon: Elam Dutch, MD;  Location: [facility];  Service: Vascular;;  . PERIPHERAL VASCULAR BALLOON ANGIOPLASTY  09/08/2016   Procedure: Peripheral Vascular Balloon Angioplasty;  Surgeon: Serafina Mitchell, MD;  Location: [facility];  Service: Cardiovascular;;  left avf  . PERIPHERAL VASCULAR BALLOON ANGIOPLASTY  09/02/2017   Procedure: PERIPHERAL VASCULAR BALLOON ANGIOPLASTY;  Surgeon: Conrad [place], MD;  Location: [facility];  Service: Cardiovascular;;  left AV Graft  . PERIPHERAL VASCULAR BALLOON ANGIOPLASTY  12/21/2017   Procedure: PERIPHERAL VASCULAR BALLOON ANGIOPLASTY;  Surgeon: Serafina Mitchell, MD;  Location: [facility];  Service: Cardiovascular;;  INOMINATE / UPPER ARM AV GRAFT  . TEE WITHOUT CARDIOVERSION N/A 03/06/2016   Procedure: TRANSESOPHAGEAL ECHOCARDIOGRAM (TEE);  Surgeon: Sueanne Margarita, MD;  Location: [facility];  Service: Cardiovascular;  Laterality: N/A;  . TUBAL LIGATION  11/1986    Allergies  Allergen Reactions  . Atacand [Candesartan] Anaphylaxis and Other (See Comments)    Swelling of the mouth and tongue.  Marland Kitchen Lisinopril Anaphylaxis and Other (See Comments)    Swelling of the mouth and tongue.  . Nsaids Anaphylaxis and Other (See Comments)    Swelling of the mouth and tongue.  Marland Kitchen Penicillins Rash and Other (See Comments)    Has patient had a PCN reaction causing immediate rash,  facial/tongue/throat swelling, SOB or lightheadedness with hypotension: No Has patient had a PCN reaction causing severe rash involving mucus membranes or skin necrosis: No Has patient had a PCN reaction that required hospitalization No Has patient had a PCN reaction occurring within the last 10 years: No If all of the above answers are "NO", then may proceed with Cephalosporin use.     Current Outpatient Medications  Medication Sig Dispense Refill  . acetaminophen (TYLENOL) 500 MG tablet Take 1,000 mg by mouth every 6 (six) hours as needed for mild pain.     Marland Kitchen albuterol (PROVENTIL) (2.5 MG/3ML) 0.083% nebulizer solution Take 3 mLs (2.5 mg total) by nebulization every 6 (six) hours as needed for wheezing or shortness of breath. ICD 10:J45.20 75 mL 12  . allopurinol (ZYLOPRIM) 100 MG tablet Take 1.5 tablets (150 mg total) by mouth daily. (Patient  taking differently: Take 100 mg by mouth 2 (two) times daily. ) 45 tablet 6  . amLODipine (NORVASC) 10 MG tablet Take 1 tablet (10 mg total) by mouth at bedtime. 90 tablet 1  . atorvastatin (LIPITOR) 40 MG tablet Take 1 tablet (40 mg total) by mouth daily. (Patient taking differently: Take 40 mg by mouth at bedtime. ) 30 tablet 6  . budesonide-formoterol (SYMBICORT) 160-4.5 MCG/ACT inhaler Inhale 2 puffs into the lungs 2 (two) times daily for 1 day. 1 Inhaler 2  . carvedilol (COREG) 6.25 MG tablet TAKE 1 TABLET (6.25 MG TOTAL) BY MOUTH 2 (TWO) TIMES DAILY WITH A MEAL. 180 tablet 0  . chlorpheniramine (CHLOR-TRIMETON) 4 MG tablet Take 1 tablet (4 mg total) by mouth 3 (three) times daily. (Patient taking differently: Take 4 mg by mouth daily at 6 (six) AM. ) 190 tablet 0  . cinacalcet (SENSIPAR) 90 MG tablet Take 90 mg by mouth daily with supper.    . cyclobenzaprine (FLEXERIL) 10 MG tablet Take 1 tablet (10 mg total) by mouth 2 (two) times daily as needed. For back pain (Patient taking differently: Take 10 mg by mouth 3 (three) times daily as needed (for  back pain). ) 60 tablet 1  . famotidine (PEPCID) 20 MG tablet TAKE 1 TABLET BY MOUTH TWICE A DAY 60 tablet 2  . fluticasone (FLONASE) 50 MCG/ACT nasal spray SPRAY 2 SPRAYS INTO EACH NOSTRIL EVERY DAY 16 g 2  . gabapentin (NEURONTIN) 300 MG capsule Take 1 capsule (300 mg total) by mouth 2 (two) times daily. (Patient taking differently: Take 300 mg by mouth 2 (two) times daily as needed (pain). ) 180 capsule 1  . lidocaine-prilocaine (EMLA) cream Apply 1 application topically daily as needed. Prior to each dialysis session  12  . meclizine (ANTIVERT) 25 MG tablet Take 25 mg by mouth 3 (three) times daily as needed for dizziness.    . multivitamin (RENA-VIT) TABS tablet Take 1 tablet by mouth daily.    . pantoprazole (PROTONIX) 40 MG tablet Take 1 tablet (40 mg total) by mouth 2 (two) times daily. (Patient taking differently: Take 40 mg by mouth at bedtime. ) 60 tablet 6  . RENVELA 800 MG tablet Take 800-2,400 mg by mouth See admin instructions. Take 3 tablets (2400 mg) by mouth with each meal & take 1 tablet (800 mg) by mouth with each snack  11  . triamcinolone cream (KENALOG) 0.1 % Apply 1 application topically 2 (two) times daily. Applied to eczematous lesions (Patient taking differently: Apply 1 application topically 2 (two) times daily as needed (for eczema). ) 80 g 1  . VENTOLIN HFA 108 (90 Base) MCG/ACT inhaler TAKE 2 PUFFS BY MOUTH EVERY 6 HOURS AS NEEDED FOR WHEEZE OR SHORTNESS OF BREATH 18 Inhaler 1   No current facility-administered medications for this visit.     Family History  Problem Relation Age of Onset  . Hypertension Mother   . Breast cancer Maternal Aunt   . Deep vein thrombosis Daughter   . Hyperlipidemia Daughter   . Hypertension Daughter   . Prostate cancer Maternal Grandmother     Social History   Socioeconomic History  . Marital status: Legally Separated    Spouse name: Not on file  . Number of children: 3  . Years of education: Not on file  . Highest education  level: Not on file  Occupational History  . Not on file  Social Needs  . Financial resource strain: Not on file  .  Food insecurity:    Worry: Not on file    Inability: Not on file  . Transportation needs:    Medical: Not on file    Non-medical: Not on file  Tobacco Use  . Smoking status: Never Smoker  . Smokeless tobacco: Never Used  Substance and Sexual Activity  . Alcohol use: Yes    Alcohol/week: 0.0 standard drinks    Comment: occ - 1 drink rarely  . Drug use: No  . Sexual activity: Not on file  Lifestyle  . Physical activity:    Days per week: Not on file    Minutes per session: Not on file  . Stress: Not on file  Relationships  . Social connections:    Talks on phone: Not on file    Gets together: Not on file    Attends religious service: Not on file    Active member of club or organization: Not on file    Attends meetings of clubs or organizations: Not on file    Relationship status: Not on file  . Intimate partner violence:    Fear of current or ex partner: Not on file    Emotionally abused: Not on file    Physically abused: Not on file    Forced sexual activity: Not on file  Other Topics Concern  . 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      Home Situation:  Lives with husband and daughter and granddaughter      Spiritual Beliefs: baptist      Lifestyle: no regular exercise, poor diet           ROS: [x]  Positive   [ ]  Negative   [ ]  All sytems reviewed and are negative  Cardiac: []  chest pain/pressure []  SOB []  DOE  Vascular: [x]  pain in legs while walking []  pain in feet when lying flat []  hx of DVT []  swelling in legs  Pulmonary: []  asthma []  wheezing  Neurologic: []  weakness in []  arms []  legs []  numbness in []  arms []  legs [] difficulty speaking or slurred speech []  temporary loss of vision in one eye []  dizziness  Hematologic: []  bleeding problems  GI []  GERD  GU: [x]   CKD/renal failure  [x]  HD---[x]  M/W/F []  T/T/S []  burning with urination []  blood in urine  Psychiatric: []  hx of major depression  Integumentary: []  rashes []  ulcers  Constitutional: []  fever []  chills  PHYSICAL EXAMINATION:  Today's Vitals   05/05/18 1332  BP: (!) 150/87  Pulse: 85  Resp: 20  Temp: 99 F (37.2 C)  SpO2: 98%  Weight: 171 lb 15.3 oz (78 kg)  Height: 5\' 2"  (1.575 m)  PainSc: 3    Body mass index is 31.45 kg/m.   General:  WDWN female in NAD Gait: Not observed HENT: WNL Pulmonary: normal non-labored breathing , without Rales, rhonchi,  wheezing Cardiac: regular, without  Murmurs without carotid bruits Abdomen: soft, NT, no masses Skin: without rashes, without ulcers  Vascular Exam/Pulses:  2+ palpable radial, femoral and DP bilaterally LUA loop AVG with thrill and somewhat pulsatile Extremities:  without ischemic changes, without Gangrene, without cellulitis; without open wounds;  Musculoskeletal: no muscle wasting or atrophy  Neurologic: A&O X 3; Moving all extremities equally;  Speech is fluent/normal  Non-Invasive Vascular Imaging:   None today    ASSESSMENT/PLAN: 63 y.o. female with ESRD on dialysis M/W/F here for evaluation of her hemodialysis  access for bleeding  -pt has had issues a couple of times with bleeding with posterior stick of the loop graft.  She has hx of venoplasty in the past.  They are able to use the anterior portion.  Given the graft is somewhat pulsatile and bleeding, will schedule for fistulogram and possible intervention.  Okay to continue sticking anterior portion of graft and continue to rotate stick sites. -pt is left handed.  No evidence of steal sx. -pt is not on anticoagulation -she does get some left thigh pain with walking-she has easily palpable femoral and DP pulses bilaterally and most likely not related to blood flow issues.    Leontine Locket, PA-C [facility] [phone]  Clinic MD:    Oneida Alar

## 2018-05-05 ENCOUNTER — Encounter: Payer: Self-pay | Admitting: Physician Assistant

## 2018-05-05 ENCOUNTER — Other Ambulatory Visit: Payer: Self-pay | Admitting: *Deleted

## 2018-05-05 ENCOUNTER — Other Ambulatory Visit: Payer: Self-pay

## 2018-05-05 ENCOUNTER — Encounter: Payer: Self-pay | Admitting: *Deleted

## 2018-05-05 ENCOUNTER — Ambulatory Visit (INDEPENDENT_AMBULATORY_CARE_PROVIDER_SITE_OTHER): Payer: Medicare Other | Admitting: Physician Assistant

## 2018-05-05 VITALS — BP 150/87 | HR 85 | Temp 99.0°F | Resp 20 | Ht 62.0 in | Wt 172.0 lb

## 2018-05-05 DIAGNOSIS — N186 End stage renal disease: Secondary | ICD-10-CM | POA: Diagnosis not present

## 2018-05-09 DIAGNOSIS — N2581 Secondary hyperparathyroidism of renal origin: Secondary | ICD-10-CM | POA: Diagnosis not present

## 2018-05-09 DIAGNOSIS — D631 Anemia in chronic kidney disease: Secondary | ICD-10-CM | POA: Diagnosis not present

## 2018-05-09 DIAGNOSIS — N186 End stage renal disease: Secondary | ICD-10-CM | POA: Diagnosis not present

## 2018-05-09 DIAGNOSIS — E1129 Type 2 diabetes mellitus with other diabetic kidney complication: Secondary | ICD-10-CM | POA: Diagnosis not present

## 2018-05-10 DIAGNOSIS — H04123 Dry eye syndrome of bilateral lacrimal glands: Secondary | ICD-10-CM | POA: Diagnosis not present

## 2018-05-10 DIAGNOSIS — H3562 Retinal hemorrhage, left eye: Secondary | ICD-10-CM | POA: Diagnosis not present

## 2018-05-10 DIAGNOSIS — H34212 Partial retinal artery occlusion, left eye: Secondary | ICD-10-CM | POA: Diagnosis not present

## 2018-05-10 DIAGNOSIS — H31092 Other chorioretinal scars, left eye: Secondary | ICD-10-CM | POA: Diagnosis not present

## 2018-05-10 DIAGNOSIS — H2513 Age-related nuclear cataract, bilateral: Secondary | ICD-10-CM | POA: Diagnosis not present

## 2018-05-10 DIAGNOSIS — H35033 Hypertensive retinopathy, bilateral: Secondary | ICD-10-CM | POA: Diagnosis not present

## 2018-05-10 DIAGNOSIS — E119 Type 2 diabetes mellitus without complications: Secondary | ICD-10-CM | POA: Diagnosis not present

## 2018-05-11 ENCOUNTER — Telehealth: Payer: Self-pay | Admitting: Family Medicine

## 2018-05-11 DIAGNOSIS — N2581 Secondary hyperparathyroidism of renal origin: Secondary | ICD-10-CM | POA: Diagnosis not present

## 2018-05-11 DIAGNOSIS — N186 End stage renal disease: Secondary | ICD-10-CM | POA: Diagnosis not present

## 2018-05-11 DIAGNOSIS — I7090 Unspecified atherosclerosis: Secondary | ICD-10-CM

## 2018-05-11 DIAGNOSIS — D631 Anemia in chronic kidney disease: Secondary | ICD-10-CM | POA: Diagnosis not present

## 2018-05-11 DIAGNOSIS — E1129 Type 2 diabetes mellitus with other diabetic kidney complication: Secondary | ICD-10-CM | POA: Diagnosis not present

## 2018-05-11 NOTE — Telephone Encounter (Signed)
Could you please schedule vascular ultrasound for this patient and inform her accordingly?  Thank you

## 2018-05-11 NOTE — Telephone Encounter (Signed)
[name] wants to speak with Dr. 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

## 2018-05-12 ENCOUNTER — Encounter (HOSPITAL_COMMUNITY): Payer: Self-pay | Admitting: Vascular Surgery

## 2018-05-12 ENCOUNTER — Ambulatory Visit (HOSPITAL_COMMUNITY)
Admission: RE | Admit: 2018-05-12 | Discharge: 2018-05-12 | Disposition: A | Discharge status: Home / Self Care | Payer: Medicare Other | Attending: Vascular Surgery | Admitting: Vascular Surgery

## 2018-05-12 ENCOUNTER — Other Ambulatory Visit: Payer: Self-pay

## 2018-05-12 ENCOUNTER — Encounter (HOSPITAL_COMMUNITY)
Admission: RE | Disposition: A | Discharge status: Home / Self Care | Payer: Self-pay | Source: Home / Self Care | Attending: Vascular Surgery

## 2018-05-12 ENCOUNTER — Other Ambulatory Visit: Payer: Self-pay | Admitting: Family Medicine

## 2018-05-12 DIAGNOSIS — N186 End stage renal disease: Secondary | ICD-10-CM | POA: Insufficient documentation

## 2018-05-12 DIAGNOSIS — Z7951 Long term (current) use of inhaled steroids: Secondary | ICD-10-CM | POA: Insufficient documentation

## 2018-05-12 DIAGNOSIS — J45909 Unspecified asthma, uncomplicated: Secondary | ICD-10-CM | POA: Diagnosis not present

## 2018-05-12 DIAGNOSIS — E039 Hypothyroidism, unspecified: Secondary | ICD-10-CM | POA: Diagnosis not present

## 2018-05-12 DIAGNOSIS — R7303 Prediabetes: Secondary | ICD-10-CM | POA: Insufficient documentation

## 2018-05-12 DIAGNOSIS — Z8249 Family history of ischemic heart disease and other diseases of the circulatory system: Secondary | ICD-10-CM | POA: Diagnosis not present

## 2018-05-12 DIAGNOSIS — I120 Hypertensive chronic kidney disease with stage 5 chronic kidney disease or end stage renal disease: Secondary | ICD-10-CM | POA: Diagnosis not present

## 2018-05-12 DIAGNOSIS — K219 Gastro-esophageal reflux disease without esophagitis: Secondary | ICD-10-CM | POA: Insufficient documentation

## 2018-05-12 DIAGNOSIS — E7800 Pure hypercholesterolemia, unspecified: Secondary | ICD-10-CM | POA: Diagnosis not present

## 2018-05-12 DIAGNOSIS — Z880 Allergy status to penicillin: Secondary | ICD-10-CM | POA: Diagnosis not present

## 2018-05-12 DIAGNOSIS — G4730 Sleep apnea, unspecified: Secondary | ICD-10-CM | POA: Diagnosis not present

## 2018-05-12 DIAGNOSIS — T82858A Stenosis of vascular prosthetic devices, implants and grafts, initial encounter: Secondary | ICD-10-CM | POA: Diagnosis not present

## 2018-05-12 DIAGNOSIS — Y832 Surgical operation with anastomosis, bypass or graft as the cause of abnormal reaction of the patient, or of later complication, without mention of misadventure at the time of the procedure: Secondary | ICD-10-CM | POA: Insufficient documentation

## 2018-05-12 DIAGNOSIS — E785 Hyperlipidemia, unspecified: Secondary | ICD-10-CM | POA: Insufficient documentation

## 2018-05-12 DIAGNOSIS — M109 Gout, unspecified: Secondary | ICD-10-CM | POA: Diagnosis not present

## 2018-05-12 DIAGNOSIS — Z79899 Other long term (current) drug therapy: Secondary | ICD-10-CM | POA: Diagnosis not present

## 2018-05-12 DIAGNOSIS — Z888 Allergy status to other drugs, medicaments and biological substances status: Secondary | ICD-10-CM | POA: Insufficient documentation

## 2018-05-12 DIAGNOSIS — M069 Rheumatoid arthritis, unspecified: Secondary | ICD-10-CM | POA: Diagnosis not present

## 2018-05-12 DIAGNOSIS — J4520 Mild intermittent asthma, uncomplicated: Secondary | ICD-10-CM

## 2018-05-12 DIAGNOSIS — Z992 Dependence on renal dialysis: Secondary | ICD-10-CM | POA: Diagnosis not present

## 2018-05-12 DIAGNOSIS — Z90710 Acquired absence of both cervix and uterus: Secondary | ICD-10-CM | POA: Diagnosis not present

## 2018-05-12 DIAGNOSIS — Z886 Allergy status to analgesic agent status: Secondary | ICD-10-CM | POA: Insufficient documentation

## 2018-05-12 DIAGNOSIS — T82898A Other specified complication of vascular prosthetic devices, implants and grafts, initial encounter: Secondary | ICD-10-CM | POA: Diagnosis not present

## 2018-05-12 HISTORY — PX: PERIPHERAL VASCULAR BALLOON ANGIOPLASTY: CATH118281

## 2018-05-12 LAB — POCT I-STAT 4, (NA,K, GLUC, HGB,HCT)
Glucose, Bld: 93 mg/dL (ref 70–99)
HCT: 31 % — ABNORMAL LOW (ref 36.0–46.0)
Hemoglobin: 10.5 g/dL — ABNORMAL LOW (ref 12.0–15.0)
Potassium: 4.2 mmol/L (ref 3.5–5.1)
Sodium: 138 mmol/L (ref 135–145)

## 2018-05-12 LAB — POCT I-STAT CREATININE: Creatinine, Ser: 6.6 mg/dL — ABNORMAL HIGH (ref 0.44–1.00)

## 2018-05-12 SURGERY — PERIPHERAL VASCULAR BALLOON ANGIOPLASTY
Anesthesia: LOCAL | Laterality: Left

## 2018-05-12 MED ORDER — HEPARIN (PORCINE) IN NACL 1000-0.9 UT/500ML-% IV SOLN
INTRAVENOUS | Status: DC | PRN
  Administered 2018-05-12: 500 mL

## 2018-05-12 MED ORDER — SODIUM CHLORIDE 0.9 % IV SOLN: 250.0000 mL | INTRAVENOUS | Status: DC | PRN

## 2018-05-12 MED ORDER — LIDOCAINE HCL (PF) 1 % IJ SOLN
INTRAMUSCULAR | Status: DC | PRN
  Administered 2018-05-12: 2 mL

## 2018-05-12 MED ORDER — SODIUM CHLORIDE 0.9% FLUSH: 3.0000 mL | INTRAVENOUS | Status: DC | PRN

## 2018-05-12 MED ORDER — IODIXANOL 320 MG/ML IV SOLN
INTRAVENOUS | Status: DC | PRN
  Administered 2018-05-12: 110 mL via INTRAVENOUS

## 2018-05-12 MED ORDER — SODIUM CHLORIDE 0.9% FLUSH: 3.0000 mL | Freq: Two times a day (BID) | INTRAVENOUS | Status: DC

## 2018-05-12 MED ORDER — HEPARIN SODIUM (PORCINE) 1000 UNIT/ML IJ SOLN
INTRAMUSCULAR | Status: AC
  Filled 2018-05-12: qty 1

## 2018-05-12 MED ORDER — LIDOCAINE HCL (PF) 1 % IJ SOLN
INTRAMUSCULAR | Status: AC
  Filled 2018-05-12: qty 30

## 2018-05-12 MED ORDER — HEPARIN SODIUM (PORCINE) 1000 UNIT/ML IJ SOLN
INTRAMUSCULAR | Status: DC | PRN
  Administered 2018-05-12: 3000 [IU] via INTRAVENOUS

## 2018-05-12 MED ORDER — ACETAMINOPHEN 325 MG PO TABS
650.0000 mg | ORAL_TABLET | Freq: Once | ORAL | Status: AC
  Administered 2018-05-12: 650 mg via ORAL
  Filled 2018-05-12 (×2): qty 2

## 2018-05-12 MED ORDER — HEPARIN (PORCINE) IN NACL 1000-0.9 UT/500ML-% IV SOLN
INTRAVENOUS | Status: AC
  Filled 2018-05-12: qty 500

## 2018-05-12 SURGICAL SUPPLY — 17 items
BALLN MUSTANG 10.0X40 75 (BALLOONS) ×2
BALLN MUSTANG 12.0X40 75 (BALLOONS) ×2
BALLN MUSTANG 8.0X40 75 (BALLOONS) ×2
BALLOON MUSTANG 10.0X40 75 (BALLOONS) ×1 IMPLANT
BALLOON MUSTANG 12.0X40 75 (BALLOONS) ×1 IMPLANT
BALLOON MUSTANG 8.0X40 75 (BALLOONS) ×1 IMPLANT
COVER DOME SNAP 22 D (MISCELLANEOUS) ×2 IMPLANT
KIT ENCORE 26 ADVANTAGE (KITS) ×2 IMPLANT
KIT MICROPUNCTURE NIT STIFF (SHEATH) ×2 IMPLANT
PROTECTION STATION PRESSURIZED (MISCELLANEOUS) ×2
SHEATH PINNACLE R/O II 7F 4CM (SHEATH) ×2 IMPLANT
SHEATH PROBE COVER 6X72 (BAG) ×4 IMPLANT
STATION PROTECTION PRESSURIZED (MISCELLANEOUS) ×1 IMPLANT
STOPCOCK MORSE 400PSI 3WAY (MISCELLANEOUS) ×2 IMPLANT
TRAY PV CATH (CUSTOM PROCEDURE TRAY) ×2 IMPLANT
TUBING CIL FLEX 10 FLL-RA (TUBING) ×2 IMPLANT
WIRE BENTSON .035X145CM (WIRE) ×2 IMPLANT

## 2018-05-12 NOTE — Telephone Encounter (Signed)
Patient called back and was informed of there appointment on Monday.

## 2018-05-12 NOTE — H&P (Signed)
History and Physical Interval Note:  05/12/2018 8:14 AM  Rachael George  has presented today for surgery, with the diagnosis of poor flow  The various methods of treatment have been discussed with the patient and family. After consideration of risks, benefits and other options for treatment, the patient has consented to  Procedure(s): A/V SHUNTOGRAM (Left) as a surgical intervention .  The patient's history has been reviewed, patient examined, no change in status, stable for surgery.  I have reviewed the patient's chart and labs.  Questions were answered to the patient's satisfaction.    Fistulogram left upper extremity.  Marty Heck  HISTORY AND PHYSICAL     CC:  dialysis access Requesting Provider:  Charlott Rakes, MD  HPI: This is a 63 y.o. female here for evaluation of her hemodialysis access.  Her access is LUA loop graft 4-7 that was placed 06/04/2016 by Dr. Oneida Alar.  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 -venoplasty (peripheral and central) 12/21/17  She presents today for evaluation of her access.  She states that back in December when they stuck the posterior portion of the graft, she bled with a puddle of blood on the floor.  They tried again at another session and she bled again and she is referred today for this.  She states they are able to stick the anterior portion of the graft without difficulty.  She does not have pain or numbness in her left hand.  The pt is left hand dominant.   Pt is on dialysis M/W/F at the center on [address]  If pt on Dialysis:   Dialysis days/center:  [facility]  The pt is on a statin for cholesterol management.  The pt is not diabetic.   The pt is on CCB and BB for hypertension.   Tobacco hx:  never The pt is not on a daily aspirin. Other AC:   none      Past Medical History:  Diagnosis Date  . Anemia   . Asthma   . ESRD (end stage renal disease) ([place])    MWF  . GERD (gastroesophageal reflux disease)   . Gout   . 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 Heavy menstrual bleeding   . High cholesterol   . History of blood transfusion 02/2016- last one   1990's- several ones   . History of kidney stones 1980's  . Hyperlipemia 12/07/2012  . Hypertension   . Hypothyroidism   . Kidney stones   . Pre-diabetes   . Prediabetes   . RA (rheumatoid arthritis) ([place])   . Sleep apnea    [place]- have CPAP, not able to use every night          Past Surgical History:  Procedure Laterality Date  . A/V SHUNTOGRAM N/A 09/08/2016   Procedure: A/V Shuntogram - Left Arm AV Graft;  Surgeon: Serafina Mitchell, MD;  Location: [facility];  Service: Cardiovascular;  Laterality: N/A;  . A/V SHUNTOGRAM N/A 09/02/2017   Procedure: A/V SHUNTOGRAM - Left Arm AVG;  Surgeon: Conrad Garceno, MD;  Location: [facility];  Service: Cardiovascular;  Laterality: N/A;  . A/V SHUNTOGRAM N/A 12/21/2017   Procedure: A/V SHUNTOGRAM - left arm;  Surgeon: Serafina Mitchell, MD;  Location: [facility]  INVASIVE [facility] LAB;  Service: Cardiovascular;  Laterality: N/A;  . ABDOMINAL HYSTERECTOMY  2000  . AV FISTULA PLACEMENT Left 05/25/2016   Procedure: INSERTION OF LEFT UPPER ARM ARTERIOVENOUS (AV) LOOP GORE-TEX GRAFT ARM;  Surgeon: Elam Dutch, MD;  Location: [facility];  Service: Vascular;  Laterality: Left;  . BASCILIC VEIN TRANSPOSITION Right 12/06/2014   Procedure: FIRST STAGE BASILIC VEIN TRANSPOSITION - RIGHT;  Surgeon: Serafina Mitchell, MD;  Location: [facility];  Service: Vascular;  Laterality: Right;  . BASCILIC VEIN TRANSPOSITION Right 02/07/2015   Procedure: RIGHT ARM SECOND STAGE BASILIC VEIN TRANSPOSITION;  Surgeon: Serafina Mitchell, MD;  Location: [place];  Service: Vascular;  Laterality: Right;  . BASCILIC VEIN  TRANSPOSITION Left 04/22/2016   Procedure: FIRST STAGE BASILIC VEIN TRANSPOSITION LEFT ARM;  Surgeon: Conrad Starbrick, MD;  Location: [place];  Service: Vascular;  Laterality: Left;  . COLONOSCOPY W/ POLYPECTOMY    . ECTOPIC PREGNANCY SURGERY  02/1982  . EXCHANGE OF A DIALYSIS CATHETER Right 04/22/2016   Procedure: EXCHANGE OF A DIALYSIS CATHETER - INSERTION RIGHT INTERNAL JUGULAR & REMOVAL FROM LEFT INTERNAL JUGULAR;  Surgeon: Conrad Plymouth, MD;  Location: [facility];  Service: Vascular;  Laterality: Right;  . I&D EXTREMITY Right 02/07/2015   Procedure: IRRIGATION AND DEBRIDEMENT RIGHT ARM HEMATOMA;  Surgeon: Serafina Mitchell, MD;  Location: [facility];  Service: Vascular;  Laterality: Right;  . INSERTION OF DIALYSIS CATHETER  03/03/2016   Procedure: INSERTION OF DIALYSIS CATHETER;  Surgeon: Elam Dutch, MD;  Location: [place];  Service: Vascular;;  . IR AV DIALY SHUNT INTRO NEEDLE/INTRACATH INITIAL W/PTA/IMG LEFT  04/20/2017  . IR AV DIALY SHUNT INTRO NEEDLE/INTRACATH INITIAL W/PTA/IMG LEFT  06/24/2017  . IR THROMBECTOMY AV FISTULA W/THROMBOLYSIS/PTA INC/SHUNT/IMG LEFT Left 01/04/2017  . IR US GUIDE VASC ACCESS LEFT  01/04/2017  . left shoulder surgery  08/2005  . LIGATION OF ARTERIOVENOUS  FISTULA  03/03/2016   Procedure: LIGATION OF ARTERIOVENOUS  FISTULA;  Surgeon: Elam Dutch, MD;  Location: [facility];  Service: Vascular;;  . PERIPHERAL VASCULAR BALLOON ANGIOPLASTY  09/08/2016   Procedure: Peripheral Vascular Balloon Angioplasty;  Surgeon: Serafina Mitchell, MD;  Location: [facility];  Service: Cardiovascular;;  left avf  . PERIPHERAL VASCULAR BALLOON ANGIOPLASTY  09/02/2017   Procedure: PERIPHERAL VASCULAR BALLOON ANGIOPLASTY;  Surgeon: Conrad Maybell, MD;  Location: [facility];  Service: Cardiovascular;;  left AV Graft  . PERIPHERAL VASCULAR BALLOON ANGIOPLASTY  12/21/2017   Procedure: PERIPHERAL VASCULAR BALLOON ANGIOPLASTY;  Surgeon: Serafina Mitchell, MD;  Location: [facility];  Service: Cardiovascular;;  INOMINATE / UPPER ARM AV GRAFT  . TEE WITHOUT CARDIOVERSION N/A 03/06/2016   Procedure: TRANSESOPHAGEAL ECHOCARDIOGRAM (TEE);  Surgeon: Sueanne Margarita, MD;  Location: [facility];  Service: Cardiovascular;  Laterality: N/A;  . TUBAL LIGATION  11/1986         Allergies  Allergen Reactions  . Atacand [Candesartan] Anaphylaxis and Other (See Comments)    Swelling of the mouth and tongue.  Marland Kitchen Lisinopril Anaphylaxis and Other (See Comments)    Swelling of the mouth and tongue.  . Nsaids Anaphylaxis and Other (See Comments)    Swelling of the mouth and tongue.  Marland Kitchen Penicillins Rash and Other (See Comments)    Has patient had a PCN reaction causing immediate rash, facial/tongue/throat swelling, SOB or lightheadedness with hypotension: No Has patient had a PCN reaction causing severe rash involving mucus membranes or skin necrosis:  No Has patient had a PCN reaction that required hospitalization No Has patient had a PCN reaction occurring within the last 10 years: No If all of the above answers are "NO", then may proceed with Cephalosporin use.           Current Outpatient Medications  Medication Sig Dispense Refill  . acetaminophen (TYLENOL) 500 MG tablet Take 1,000 mg by mouth every 6 (six) hours as needed for mild pain.     Marland Kitchen albuterol (PROVENTIL) (2.5 MG/3ML) 0.083% nebulizer solution Take 3 mLs (2.5 mg total) by nebulization every 6 (six) hours as needed for wheezing or shortness of breath. ICD 10:J45.20 75 mL 12  . allopurinol (ZYLOPRIM) 100 MG tablet Take 1.5 tablets (150 mg total) by mouth daily. (Patient taking differently: Take 100 mg by mouth 2 (two) times daily. ) 45 tablet 6  . amLODipine (NORVASC) 10 MG tablet Take 1 tablet (10 mg total) by mouth at bedtime. 90 tablet 1  . atorvastatin (LIPITOR) 40 MG tablet Take 1 tablet (40 mg total) by mouth daily. (Patient taking differently: Take 40 mg by mouth at bedtime. ) 30 tablet  6  . budesonide-formoterol (SYMBICORT) 160-4.5 MCG/ACT inhaler Inhale 2 puffs into the lungs 2 (two) times daily for 1 day. 1 Inhaler 2  . carvedilol (COREG) 6.25 MG tablet TAKE 1 TABLET (6.25 MG TOTAL) BY MOUTH 2 (TWO) TIMES DAILY WITH A MEAL. 180 tablet 0  . chlorpheniramine (CHLOR-TRIMETON) 4 MG tablet Take 1 tablet (4 mg total) by mouth 3 (three) times daily. (Patient taking differently: Take 4 mg by mouth daily at 6 (six) AM. ) 190 tablet 0  . cinacalcet (SENSIPAR) 90 MG tablet Take 90 mg by mouth daily with supper.    . cyclobenzaprine (FLEXERIL) 10 MG tablet Take 1 tablet (10 mg total) by mouth 2 (two) times daily as needed. For back pain (Patient taking differently: Take 10 mg by mouth 3 (three) times daily as needed (for back pain). ) 60 tablet 1  . famotidine (PEPCID) 20 MG tablet TAKE 1 TABLET BY MOUTH TWICE A DAY 60 tablet 2  . fluticasone (FLONASE) 50 MCG/ACT nasal spray SPRAY 2 SPRAYS INTO EACH NOSTRIL EVERY DAY 16 g 2  . gabapentin (NEURONTIN) 300 MG capsule Take 1 capsule (300 mg total) by mouth 2 (two) times daily. (Patient taking differently: Take 300 mg by mouth 2 (two) times daily as needed (pain). ) 180 capsule 1  . lidocaine-prilocaine (EMLA) cream Apply 1 application topically daily as needed. Prior to each dialysis session  12  . meclizine (ANTIVERT) 25 MG tablet Take 25 mg by mouth 3 (three) times daily as needed for dizziness.    . multivitamin (RENA-VIT) TABS tablet Take 1 tablet by mouth daily.    . pantoprazole (PROTONIX) 40 MG tablet Take 1 tablet (40 mg total) by mouth 2 (two) times daily. (Patient taking differently: Take 40 mg by mouth at bedtime. ) 60 tablet 6  . RENVELA 800 MG tablet Take 800-2,400 mg by mouth See admin instructions. Take 3 tablets (2400 mg) by mouth with each meal & take 1 tablet (800 mg) by mouth with each snack  11  . triamcinolone cream (KENALOG) 0.1 % Apply 1 application topically 2 (two) times daily. Applied to eczematous lesions  (Patient taking differently: Apply 1 application topically 2 (two) times daily as needed (for eczema). ) 80 g 1  . VENTOLIN HFA 108 (90 Base) MCG/ACT inhaler TAKE 2 PUFFS BY MOUTH EVERY 6 HOURS AS  NEEDED FOR WHEEZE OR SHORTNESS OF BREATH 18 Inhaler 1   No current facility-administered medications for this visit.          Family History  Problem Relation Age of Onset  . Hypertension Mother   . Breast cancer Maternal Aunt   . Deep vein thrombosis Daughter   . Hyperlipidemia Daughter   . Hypertension Daughter   . Prostate cancer Maternal Grandmother     Social History        Socioeconomic History  . Marital status: Legally Separated    Spouse name: Not on file  . Number of children: 3  . Years of education: Not on file  . Highest education level: Not on file  Occupational History  . Not on file  Social Needs  . Financial resource strain: Not on file  . Food insecurity:    Worry: Not on file    Inability: Not on file  . Transportation needs:    Medical: Not on file    Non-medical: Not on file  Tobacco Use  . Smoking status: Never Smoker  . Smokeless tobacco: Never Used  Substance and Sexual Activity  . Alcohol use: Yes    Alcohol/week: 0.0 standard drinks    Comment: occ - 1 drink rarely  . Drug use: No  . Sexual activity: Not on file  Lifestyle  . Physical activity:    Days per week: Not on file    Minutes per session: Not on file  . Stress: Not on file  Relationships  . Social connections:    Talks on phone: Not on file    Gets together: Not on file    Attends religious service: Not on file    Active member of club or organization: Not on file    Attends meetings of clubs or organizations: Not on file    Relationship status: Not on file  . Intimate partner violence:    Fear of current or ex partner: Not on file    Emotionally abused: Not on file    Physically abused: Not on file    Forced sexual activity:  Not on file  Other Topics Concern  . 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      Home Situation:  Lives with husband and daughter and granddaughter      Spiritual Beliefs: [employer]ist      Lifestyle: no regular exercise, poor diet           ROS: [x]  Positive   [ ]  Negative   [ ]  All sytems reviewed and are negative  Cardiac: []  chest pain/pressure []  SOB []  DOE  Vascular: [x]  pain in legs while walking []  pain in feet when lying flat []  hx of DVT []  swelling in legs  Pulmonary: []  asthma []  wheezing  Neurologic: []  weakness in []  arms []  legs []  numbness in []  arms []  legs [] difficulty speaking or slurred speech []  temporary loss of vision in one eye []  dizziness  Hematologic: []  bleeding problems  GI []  GERD  GU: [x]  CKD/renal failure  [x]  HD---[x]  M/W/F []  T/T/S []  burning with urination []  blood in urine  Psychiatric: []  hx of major depression  Integumentary: []  rashes []  ulcers  Constitutional: []  fever []  chills  PHYSICAL EXAMINATION:     Today's Vitals   05/05/18 1332  BP: (!) 150/87  Pulse: 85  Resp: 20  Temp: 99 F (37.2 C)  SpO2: 98%  Weight: 171 lb 15.3 oz (78 kg)  Height: 5\' 2"  (1.575 m)  PainSc: 3    Body mass index is 31.45 kg/m.   General:  WDWN female in NAD Gait: Not observed HENT: WNL Pulmonary: normal non-labored breathing , without Rales, rhonchi,  wheezing Cardiac: regular, without  Murmurs without carotid bruits Abdomen: soft, NT, no masses Skin: without rashes, without ulcers  Vascular Exam/Pulses:  2+ palpable radial, femoral and DP bilaterally LUA loop AVG with thrill and somewhat pulsatile Extremities:  without ischemic changes, without Gangrene, without cellulitis; without open wounds;  Musculoskeletal: no muscle wasting or atrophy       Neurologic: A&O X 3; Moving all extremities equally;  Speech is  fluent/normal  Non-Invasive Vascular Imaging:   None today    ASSESSMENT/PLAN: 63 y.o. female with ESRD on dialysis M/W/F here for evaluation of her hemodialysis access for bleeding  -pt has had issues a couple of times with bleeding with posterior stick of the loop graft.  She has hx of venoplasty in the past.  They are able to use the anterior portion.  Given the graft is somewhat pulsatile and bleeding, will schedule for fistulogram and possible intervention.  Okay to continue sticking anterior portion of graft and continue to rotate stick sites. -pt is left handed.  No evidence of steal sx. -pt is not on anticoagulation -she does get some left thigh pain with walking-she has easily palpable femoral and DP pulses bilaterally and most likely not related to blood flow issues.    Leontine Locket, PA-C [facility] [phone]

## 2018-05-12 NOTE — Discharge Instructions (Signed)

## 2018-05-12 NOTE — Telephone Encounter (Signed)
Patient has a 1pm appointment on Monday. Come in entrance [address]. Called and lvm for patient to call back.

## 2018-05-12 NOTE — Op Note (Signed)
OPERATIVE NOTE   PROCEDURE: 1. Left arteriovenous graft cannulation under ultrasound guidance 2. Left arm fistulogram including central venogram 3. Central venoplasty - left innominate vein (10 mm x 40 mm Mustang and 12 mm x 40 mm Mustang) 4. Peripheral venoplasty - venous outflow tract of left upper arm graft (8 mm x 40 mm Mustang)  PRE-OPERATIVE DIAGNOSIS: Malfunctioning left arteriovenous upper arm graft  POST-OPERATIVE DIAGNOSIS: same as above   SURGEON: Marty Heck, MD  ANESTHESIA: local  ESTIMATED BLOOD LOSS: 5 cc  FINDING(S): 1. 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  There was a second approximate 40 to 50% stenosis of the venous outflow tract of the upper arm that was angioplastied with an 8 mm Mustang with no residual stenosis.  The arterial limb of the upper arm graft had no significant stenosis and was widely patent.  SPECIMEN(S):  None  CONTRAST: 110 cc  INDICATIONS: Rachael George is a 63 y.o. female who  presents with malfunctioning left upper arm arteriovenous graft.  The patient is scheduled for left arm fistulogram.  The patient is aware the risks include but are not limited to: bleeding, infection, thrombosis of the cannulated access, and possible anaphylactic reaction to the contrast.  The patient is aware of the risks of the procedure and elects to proceed forward.  DESCRIPTION: After full informed written consent was obtained, the patient was brought back to the angiography suite and placed supine upon the angiography table.  The patient was connected to monitoring equipment.  The left arm was prepped and draped in the standard fashion for a left arm fistulogram.  Under ultrasound guidance, the left upper arm arteriovenous graft was evaluated, it was patent, an image was saved.  Using ultrasound the graft was cannulated with a  micropuncture needle.  The microwire was advanced into the fistula and the needle was exchanged for the a microsheath, which was lodged 2 cm into the access.  The wire was removed and the sheath was connected to the IV extension tubing.  Hand injections were completed to image the access.  After initial injection it appeared that we stuck toward the arterial limb of the graft.  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  The arterial limb of the graft look widely patent.  In order to intervene on the venous side of the graft a 4-0 pursestring with Monocryl was placed around the micro sheath and this was removed.  We use ultrasound again and then reaccessed more medial on the venous limb of the graft and placed a micro access needle and microwire and exchanged for micro-sheath.  I then placed a Bentson wire and exchanged for a short 7 [place] sheath.  The patient was given 3000 units of IV heparin.  Ultimately was able advance my Bentson wire across the innominate stenosis.  A hand-injection did confirm through the sheath that we were across the lesion.  Initially selected a 10 mm x 40 mm Mustang and then a second 12 mm x 40 mm Mustang that was deployed to nominal pressure across the innominate stenosis for 2 minutes.  Patient was having significant chest pain during intervention and I did  not feel comfortable using a larger balloon given that only a 10 mm balloon was used last time.  There was less than 30% residual stenosis.  At that point in time we then treated the second lesion in the upper arm at the outflow with an 8 mm x 40 mm Mustang and there was really minimal waist and so I did not use a drug-coated balloon.  Final injection showed widely patent upper arm graft.  There was a thrill improved from a pulse.  At that  point in time our 7 [place] sheath was removed after 4-0 monocryl pursestring was placed and pressure was held for 5 minutes.  She will be taken the PACU in stable condition.  COMPLICATIONS: None  CONDITION: Stable  Marty Heck, MD [facility] Office: [phone] Pager: [phone]  05/12/2018 9:42 AM

## 2018-05-13 DIAGNOSIS — D631 Anemia in chronic kidney disease: Secondary | ICD-10-CM | POA: Diagnosis not present

## 2018-05-13 DIAGNOSIS — N186 End stage renal disease: Secondary | ICD-10-CM | POA: Diagnosis not present

## 2018-05-13 DIAGNOSIS — E1129 Type 2 diabetes mellitus with other diabetic kidney complication: Secondary | ICD-10-CM | POA: Diagnosis not present

## 2018-05-13 DIAGNOSIS — N2581 Secondary hyperparathyroidism of renal origin: Secondary | ICD-10-CM | POA: Diagnosis not present

## 2018-05-16 ENCOUNTER — Ambulatory Visit (HOSPITAL_COMMUNITY)
Admission: RE | Admit: 2018-05-16 | Discharge: 2018-05-16 | Disposition: A | Discharge status: Home / Self Care | Payer: Medicare Other | Source: Ambulatory Visit | Attending: Family Medicine | Admitting: Family Medicine

## 2018-05-16 DIAGNOSIS — I7090 Unspecified atherosclerosis: Secondary | ICD-10-CM | POA: Diagnosis not present

## 2018-05-16 DIAGNOSIS — D631 Anemia in chronic kidney disease: Secondary | ICD-10-CM | POA: Diagnosis not present

## 2018-05-16 DIAGNOSIS — N186 End stage renal disease: Secondary | ICD-10-CM | POA: Diagnosis not present

## 2018-05-16 DIAGNOSIS — N2581 Secondary hyperparathyroidism of renal origin: Secondary | ICD-10-CM | POA: Diagnosis not present

## 2018-05-16 DIAGNOSIS — E1129 Type 2 diabetes mellitus with other diabetic kidney complication: Secondary | ICD-10-CM | POA: Diagnosis not present

## 2018-05-16 NOTE — Progress Notes (Signed)
Bilateral carotid duplex completed. Preliminary results in Chart Review CV Proc. [place] Vauda Salvucci,RVS 05/16/2018, 1:27 PM

## 2018-05-18 DIAGNOSIS — E1129 Type 2 diabetes mellitus with other diabetic kidney complication: Secondary | ICD-10-CM | POA: Diagnosis not present

## 2018-05-18 DIAGNOSIS — D631 Anemia in chronic kidney disease: Secondary | ICD-10-CM | POA: Diagnosis not present

## 2018-05-18 DIAGNOSIS — N2581 Secondary hyperparathyroidism of renal origin: Secondary | ICD-10-CM | POA: Diagnosis not present

## 2018-05-18 DIAGNOSIS — N186 End stage renal disease: Secondary | ICD-10-CM | POA: Diagnosis not present

## 2018-05-20 DIAGNOSIS — N2581 Secondary hyperparathyroidism of renal origin: Secondary | ICD-10-CM | POA: Diagnosis not present

## 2018-05-20 DIAGNOSIS — D631 Anemia in chronic kidney disease: Secondary | ICD-10-CM | POA: Diagnosis not present

## 2018-05-20 DIAGNOSIS — E1129 Type 2 diabetes mellitus with other diabetic kidney complication: Secondary | ICD-10-CM | POA: Diagnosis not present

## 2018-05-20 DIAGNOSIS — N186 End stage renal disease: Secondary | ICD-10-CM | POA: Diagnosis not present

## 2018-05-22 ENCOUNTER — Other Ambulatory Visit: Payer: Self-pay | Admitting: Family Medicine

## 2018-05-22 DIAGNOSIS — N186 End stage renal disease: Secondary | ICD-10-CM | POA: Diagnosis not present

## 2018-05-22 DIAGNOSIS — I10 Essential (primary) hypertension: Secondary | ICD-10-CM

## 2018-05-22 DIAGNOSIS — Z992 Dependence on renal dialysis: Secondary | ICD-10-CM | POA: Diagnosis not present

## 2018-05-22 DIAGNOSIS — I129 Hypertensive chronic kidney disease with stage 1 through stage 4 chronic kidney disease, or unspecified chronic kidney disease: Secondary | ICD-10-CM | POA: Diagnosis not present

## 2018-05-23 DIAGNOSIS — N186 End stage renal disease: Secondary | ICD-10-CM | POA: Diagnosis not present

## 2018-05-23 DIAGNOSIS — D631 Anemia in chronic kidney disease: Secondary | ICD-10-CM | POA: Diagnosis not present

## 2018-05-23 DIAGNOSIS — E1129 Type 2 diabetes mellitus with other diabetic kidney complication: Secondary | ICD-10-CM | POA: Diagnosis not present

## 2018-05-23 DIAGNOSIS — N2581 Secondary hyperparathyroidism of renal origin: Secondary | ICD-10-CM | POA: Diagnosis not present

## 2018-05-24 ENCOUNTER — Telehealth: Payer: Self-pay

## 2018-05-24 ENCOUNTER — Encounter: Payer: Self-pay | Admitting: Family Medicine

## 2018-05-24 ENCOUNTER — Ambulatory Visit: Payer: Medicare Other | Attending: Family Medicine | Admitting: Family Medicine

## 2018-05-24 VITALS — BP 164/88 | HR 83 | Temp 98.5°F | Ht 62.0 in | Wt 171.0 lb

## 2018-05-24 DIAGNOSIS — I120 Hypertensive chronic kidney disease with stage 5 chronic kidney disease or end stage renal disease: Secondary | ICD-10-CM | POA: Insufficient documentation

## 2018-05-24 DIAGNOSIS — Z8249 Family history of ischemic heart disease and other diseases of the circulatory system: Secondary | ICD-10-CM | POA: Diagnosis not present

## 2018-05-24 DIAGNOSIS — G4730 Sleep apnea, unspecified: Secondary | ICD-10-CM | POA: Diagnosis not present

## 2018-05-24 DIAGNOSIS — J45909 Unspecified asthma, uncomplicated: Secondary | ICD-10-CM | POA: Insufficient documentation

## 2018-05-24 DIAGNOSIS — L304 Erythema intertrigo: Secondary | ICD-10-CM

## 2018-05-24 DIAGNOSIS — E039 Hypothyroidism, unspecified: Secondary | ICD-10-CM | POA: Insufficient documentation

## 2018-05-24 DIAGNOSIS — N186 End stage renal disease: Secondary | ICD-10-CM

## 2018-05-24 DIAGNOSIS — E1122 Type 2 diabetes mellitus with diabetic chronic kidney disease: Secondary | ICD-10-CM | POA: Diagnosis not present

## 2018-05-24 DIAGNOSIS — K219 Gastro-esophageal reflux disease without esophagitis: Secondary | ICD-10-CM | POA: Diagnosis not present

## 2018-05-24 DIAGNOSIS — M069 Rheumatoid arthritis, unspecified: Secondary | ICD-10-CM | POA: Diagnosis not present

## 2018-05-24 DIAGNOSIS — Z992 Dependence on renal dialysis: Secondary | ICD-10-CM | POA: Insufficient documentation

## 2018-05-24 DIAGNOSIS — Z7901 Long term (current) use of anticoagulants: Secondary | ICD-10-CM | POA: Diagnosis not present

## 2018-05-24 DIAGNOSIS — I10 Essential (primary) hypertension: Secondary | ICD-10-CM | POA: Diagnosis not present

## 2018-05-24 DIAGNOSIS — M1A09X0 Idiopathic chronic gout, multiple sites, without tophus (tophi): Secondary | ICD-10-CM | POA: Diagnosis not present

## 2018-05-24 DIAGNOSIS — R011 Cardiac murmur, unspecified: Secondary | ICD-10-CM | POA: Diagnosis not present

## 2018-05-24 DIAGNOSIS — E1169 Type 2 diabetes mellitus with other specified complication: Secondary | ICD-10-CM | POA: Diagnosis not present

## 2018-05-24 DIAGNOSIS — E785 Hyperlipidemia, unspecified: Secondary | ICD-10-CM | POA: Diagnosis not present

## 2018-05-24 DIAGNOSIS — E7800 Pure hypercholesterolemia, unspecified: Secondary | ICD-10-CM | POA: Diagnosis not present

## 2018-05-24 DIAGNOSIS — Z79899 Other long term (current) drug therapy: Secondary | ICD-10-CM | POA: Insufficient documentation

## 2018-05-24 LAB — POCT GLYCOSYLATED HEMOGLOBIN (HGB A1C): HbA1c, POC (controlled diabetic range): 5.4 % (ref 0.0–7.0)

## 2018-05-24 MED ORDER — ALLOPURINOL 100 MG PO TABS: 100.0000 mg | ORAL_TABLET | Freq: Two times a day (BID) | ORAL | 6 refills | Status: DC

## 2018-05-24 MED ORDER — CLOTRIMAZOLE 1 % EX CREA: 1.0000 "application " | TOPICAL_CREAM | Freq: Two times a day (BID) | CUTANEOUS | 1 refills | Status: DC

## 2018-05-24 MED ORDER — BUDESONIDE-FORMOTEROL FUMARATE 160-4.5 MCG/ACT IN AERO: 2.0000 | INHALATION_SPRAY | Freq: Two times a day (BID) | RESPIRATORY_TRACT | 6 refills | Status: DC

## 2018-05-24 NOTE — Telephone Encounter (Signed)
Call placed to [facility] # [phone] to inquire if they are able to draw a lipid panel tomorrow when the patient is at HD.  Spoke to Gregery Na, RN who stated that the are not able to run the lab for the patient but if the patient is given tubes from [facility] lab, they will put the blood in the tubes and the patient can return the tubes to [facility].  He explained that it is not part of the normal labs that they draw and they are not able to bill for it. This information was shared with Carilyn Goodpasture, RN and [facility], Lab Tech

## 2018-05-24 NOTE — Progress Notes (Signed)
Subjective:  Patient ID: Rachael George, female    DOB: 26-Apr-1955  Age: 63 y.o. MRN: [id number]  CC: Hypertension   HPI Rachael George is a  63 yr old female with a past medical history of hypertension, type 2 diabetes mellutlits , ESRD with dialysis (Monday, Wed & Friday) and gout.  Who presents today for follow-up visit.  Essential Hypertension:   She is compliant with her medications and low sodium diet/DASH diet.  She has not taken her blood pressure meds today.  She complains of occasional chest pain and palpitations; these occurr during her dialysis session prior to AV fistula procedures and resolves after.  Symptoms absent at this time She denies any exacerbating events to the chest pain, she says the chest pain/palpitations have no pattern of occurring at exertion or at rest.  She says she is losing vision in her left eye, she recently to the ophthalmologist Feb 18th 2020 ([facility]) where she found to have deposits in her left eye. She was then sent for carotid doppler. Carotid Doppler findings listed below.   Right Carotid: Velocities in the right ICA are consistent with a 1-39% stenosis. Left Carotid: Velocities in the left ICA are consistent with a 1-39% stenosis. Vertebrals:  Bilateral vertebral arteries demonstrate antegrade flow. Subclavians: Left subclavian artery flow was disturbed. Normal flow hemodynamics were seen in the right subclavian artery. Left dialysis graft in              the upper arm.  Hyperlipidemia: She is complaint with medication.  She denies adverse reactions to medication. She is not complaint with her low fat diet. She is trying to cut back on eating a lot of meat.  DM II -  Today Hgb A1c is 5.4; previous Hgb A1c was 5.5 in Nov. 2019. She does not take any medication to manage her Type II Diabetes.  It is managed by lifestyle modifications and diet.  She denies wounds lesions, wounds.  She has had her influenza vaccination.   ESRD  w/ dialysis AV fistula - She is followed by the nephrologist Dr. Posey Pronto patient last dialysis session was Monday 05/22/2018. She is status post peripheral vascular balloon angioplasty on 05/12/2018 due to blockage and now has resolved. Next dialysis session scheduled for 05/25/2018.  Gout - She takes allopurinol to manage her gout. She denies acute flares or adverse effects from medication.  She complains of a pruritic rash beneath both breast, lower abdominal wall and between her thighs bilaterally.  She informs me she needs a refill on her 'eczema cream' which she applies to the rash.  Past Medical History:  Diagnosis Date  . Anemia   . Asthma   . ESRD (end stage renal disease) ([place])    MWF  . GERD (gastroesophageal reflux disease)   . Gout   . 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 Heavy menstrual bleeding   . High cholesterol   . History of blood transfusion 02/2016- last one   1990's- several ones   . History of kidney stones 1980's  . Hyperlipemia 12/07/2012  . Hypertension   . Hypothyroidism   . Kidney stones   . Pre-diabetes   . Prediabetes   . RA (rheumatoid arthritis) ([place])   . Sleep apnea    [place]- have CPAP, not able to use every night     Past Surgical History:  Procedure Laterality Date  . A/V SHUNTOGRAM N/A 09/08/2016  Procedure: A/V Shuntogram - Left Arm AV Graft;  Surgeon: Serafina Mitchell, MD;  Location: [facility];  Service: Cardiovascular;  Laterality: N/A;  . A/V SHUNTOGRAM N/A 09/02/2017   Procedure: A/V SHUNTOGRAM - Left Arm AVG;  Surgeon: Conrad Winkelman, MD;  Location: [facility];  Service: Cardiovascular;  Laterality: N/A;  . A/V SHUNTOGRAM N/A 12/21/2017   Procedure: A/V SHUNTOGRAM - left arm;  Surgeon: Serafina Mitchell, MD;  Location: [facility];  Service: Cardiovascular;  Laterality: N/A;  . ABDOMINAL HYSTERECTOMY  2000  . AV FISTULA PLACEMENT Left 05/25/2016   Procedure: INSERTION OF LEFT UPPER ARM ARTERIOVENOUS  (AV) LOOP GORE-TEX GRAFT ARM;  Surgeon: Elam Dutch, MD;  Location: [facility];  Service: Vascular;  Laterality: Left;  . BASCILIC VEIN TRANSPOSITION Right 12/06/2014   Procedure: FIRST STAGE BASILIC VEIN TRANSPOSITION - RIGHT;  Surgeon: Serafina Mitchell, MD;  Location: [facility];  Service: Vascular;  Laterality: Right;  . BASCILIC VEIN TRANSPOSITION Right 02/07/2015   Procedure: RIGHT ARM SECOND STAGE BASILIC VEIN TRANSPOSITION;  Surgeon: Serafina Mitchell, MD;  Location: [facility];  Service: Vascular;  Laterality: Right;  . BASCILIC VEIN TRANSPOSITION Left 04/22/2016   Procedure: FIRST STAGE BASILIC VEIN TRANSPOSITION LEFT ARM;  Surgeon: Conrad Seama, MD;  Location: [facility];  Service: Vascular;  Laterality: Left;  . COLONOSCOPY W/ POLYPECTOMY    . ECTOPIC PREGNANCY SURGERY  02/1982  . EXCHANGE OF A DIALYSIS CATHETER Right 04/22/2016   Procedure: EXCHANGE OF A DIALYSIS CATHETER - INSERTION RIGHT INTERNAL JUGULAR & REMOVAL FROM LEFT INTERNAL JUGULAR;  Surgeon: Conrad Lakeside, MD;  Location: [facility];  Service: Vascular;  Laterality: Right;  . I&D EXTREMITY Right 02/07/2015   Procedure: IRRIGATION AND DEBRIDEMENT RIGHT ARM HEMATOMA;  Surgeon: Serafina Mitchell, MD;  Location: [place] [facility];  Service: Vascular;  Laterality: Right;  . INSERTION OF DIALYSIS CATHETER  03/03/2016   Procedure: INSERTION OF DIALYSIS CATHETER;  Surgeon: Elam Dutch, MD;  Location: [facility];  Service: Vascular;;  . IR AV DIALY SHUNT INTRO NEEDLE/INTRACATH INITIAL W/PTA/IMG LEFT  04/20/2017  . IR AV DIALY SHUNT INTRO NEEDLE/INTRACATH INITIAL W/PTA/IMG LEFT  06/24/2017  . IR THROMBECTOMY AV FISTULA W/THROMBOLYSIS/PTA INC/SHUNT/IMG LEFT Left 01/04/2017  . IR US GUIDE VASC ACCESS LEFT  01/04/2017  . left shoulder surgery  08/2005  . LIGATION OF ARTERIOVENOUS  FISTULA  03/03/2016   Procedure: LIGATION OF ARTERIOVENOUS  FISTULA;  Surgeon: Elam Dutch, MD;  Location: [facility];  Service: Vascular;;  . PERIPHERAL VASCULAR BALLOON ANGIOPLASTY  09/08/2016    Procedure: Peripheral Vascular Balloon Angioplasty;  Surgeon: Serafina Mitchell, MD;  Location: [facility];  Service: Cardiovascular;;  left avf  . PERIPHERAL VASCULAR BALLOON ANGIOPLASTY  09/02/2017   Procedure: PERIPHERAL VASCULAR BALLOON ANGIOPLASTY;  Surgeon: Conrad Provencal, MD;  Location: [facility];  Service: Cardiovascular;;  left AV Graft  . PERIPHERAL VASCULAR BALLOON ANGIOPLASTY  12/21/2017   Procedure: PERIPHERAL VASCULAR BALLOON ANGIOPLASTY;  Surgeon: Serafina Mitchell, MD;  Location: [facility];  Service: Cardiovascular;;  INOMINATE / UPPER ARM AV GRAFT  . PERIPHERAL VASCULAR BALLOON ANGIOPLASTY Left 05/12/2018   Procedure: PERIPHERAL VASCULAR BALLOON ANGIOPLASTY;  Surgeon: Marty Heck, MD;  Location: [facility];  Service: Cardiovascular;  Laterality: Left;  Arm fistula  . TEE WITHOUT CARDIOVERSION N/A 03/06/2016   Procedure: TRANSESOPHAGEAL ECHOCARDIOGRAM (TEE);  Surgeon: Sueanne Margarita, MD;  Location: [facility];  Service: Cardiovascular;  Laterality: N/A;  . TUBAL LIGATION  11/1986    Family History  Problem Relation Age of Onset  . Hypertension Mother   . Breast cancer Maternal Aunt   . Deep vein thrombosis Daughter   . Hyperlipidemia Daughter   . Hypertension Daughter   . Prostate cancer Maternal Grandmother     Allergies  Allergen Reactions  . Atacand [Candesartan] Anaphylaxis and Other (See Comments)    Swelling of the mouth and tongue.  Marland Kitchen Lisinopril Anaphylaxis and Other (See Comments)    Swelling of the mouth and tongue.  . Nsaids Anaphylaxis and Other (See Comments)    Swelling of the mouth and tongue.  Marland Kitchen Penicillins Rash and Other (See Comments)    Has patient had a PCN reaction causing immediate rash, facial/tongue/throat swelling, SOB or lightheadedness with hypotension: No Has patient had a PCN reaction causing severe rash involving mucus membranes or skin necrosis: No Has patient had a PCN reaction that required  hospitalization No Has patient had a PCN reaction occurring within the last 10 years: No If all of the above answers are "NO", then may proceed with Cephalosporin use.     Outpatient Medications Prior to Visit  Medication Sig Dispense Refill  . acetaminophen (TYLENOL) 500 MG tablet Take 1,000 mg by mouth every 6 (six) hours as needed for mild pain.     Marland Kitchen albuterol (PROVENTIL) (2.5 MG/3ML) 0.083% nebulizer solution Take 3 mLs (2.5 mg total) by nebulization every 6 (six) hours as needed for wheezing or shortness of breath. ICD 10:J45.20 75 mL 12  . amLODipine (NORVASC) 10 MG tablet TAKE 1 TABLET BY MOUTH EVERYDAY AT BEDTIME 30 tablet 0  . atorvastatin (LIPITOR) 40 MG tablet Take 1 tablet (40 mg total) by mouth daily. (Patient taking differently: Take 40 mg by mouth at bedtime. ) 30 tablet 6  . carvedilol (COREG) 6.25 MG tablet TAKE 1 TABLET (6.25 MG TOTAL) BY MOUTH 2 (TWO) TIMES DAILY WITH A MEAL. 60 tablet 0  . chlorpheniramine (CHLOR-TRIMETON) 4 MG tablet Take 1 tablet (4 mg total) by mouth 3 (three) times daily. (Patient taking differently: Take 4 mg by mouth every 4 (four) hours as needed for allergies. ) 190 tablet 0  . cyclobenzaprine (FLEXERIL) 10 MG tablet Take 1 tablet (10 mg total) by mouth 2 (two) times daily as needed. For back pain (Patient taking differently: Take 10 mg by mouth 3 (three) times daily as needed (for back pain). ) 60 tablet 1  . famotidine (PEPCID) 20 MG tablet TAKE 1 TABLET BY MOUTH TWICE A DAY 60 tablet 2  . fluticasone (FLONASE) 50 MCG/ACT nasal spray SPRAY 2 SPRAYS INTO EACH NOSTRIL EVERY DAY (Patient taking differently: Place 2 sprays into both nostrils daily. ) 16 g 2  . gabapentin (NEURONTIN) 300 MG capsule Take 1 capsule (300 mg total) by mouth 2 (two) times daily. (Patient taking differently: Take 300 mg by mouth 2 (two) times daily as needed (pain). ) 180 capsule 1  . meclizine (ANTIVERT) 25 MG tablet Take 25 mg by mouth 3 (three) times daily as needed for  dizziness.    . multivitamin (RENA-VIT) TABS tablet Take 1 tablet by mouth daily.    . pantoprazole (PROTONIX) 40 MG tablet Take 1 tablet (40 mg total) by mouth 2 (two) times daily. (Patient taking differently: Take 40 mg by mouth at bedtime. ) 60 tablet 6  . RENVELA 800 MG tablet Take 800-2,400 mg by mouth See admin instructions. Take 3  tablets (2400 mg) by mouth with each meal & take 1 tablet (800 mg) by mouth with each snack  11  . triamcinolone cream (KENALOG) 0.1 % Apply 1 application topically 2 (two) times daily. Applied to eczematous lesions (Patient taking differently: Apply 1 application topically 2 (two) times daily as needed (for eczema). ) 80 g 1  . VENTOLIN HFA 108 (90 Base) MCG/ACT inhaler INHALE 2 PUFFS BY MOUTH EVERY 6 HOURS AS NEEDED FOR WHEEZE OR SHORTNESS OF BREATH 18 Inhaler 1  . allopurinol (ZYLOPRIM) 100 MG tablet Take 1.5 tablets (150 mg total) by mouth daily. (Patient taking differently: Take 100 mg by mouth 2 (two) times daily. ) 45 tablet 6  . budesonide-formoterol (SYMBICORT) 160-4.5 MCG/ACT inhaler Inhale 2 puffs into the lungs 2 (two) times daily for 1 day. 1 Inhaler 2   No facility-administered medications prior to visit.      ROS Review of Systems  Constitutional: Negative for chills, fatigue, fever and unexpected weight change.  Eyes: Negative for visual disturbance.  Respiratory: Negative for shortness of breath and wheezing.   Cardiovascular: Positive for chest pain and palpitations. Negative for leg swelling.  Gastrointestinal: Negative for abdominal distention and abdominal pain.  Endocrine: Negative for polydipsia, polyphagia and polyuria.  Genitourinary: Positive for decreased urine volume.       ESRD, Receives dialysis  Musculoskeletal: Negative for myalgias.  Skin: Positive for rash. Negative for color change, pallor and wound.  Neurological: Negative for facial asymmetry, speech difficulty, weakness and numbness.  Psychiatric/Behavioral: Negative  for agitation and confusion.    Objective:  BP (!) 164/88   Pulse 83   Temp 98.5 F (36.9 C) (Oral)   Ht 5\' 2"  (1.575 m)   Wt 171 lb (77.6 kg)   SpO2 99%   BMI 31.28 kg/m   BP/Weight 05/24/2018 05/12/2018 1/44/8185  Systolic BP 631 497 026  Diastolic BP 88 90 87  Wt. (Lbs) 171 168 171.96  BMI 31.28 30.73 31.45      Physical Exam Vitals signs and nursing note reviewed.  Constitutional:      Appearance: Normal appearance. She is normal weight.  HENT:     Head: Normocephalic and atraumatic.  Eyes:     Extraocular Movements: Extraocular movements intact.     Conjunctiva/sclera: Conjunctivae normal.     Pupils: Pupils are equal, round, and reactive to light.  Neck:     Musculoskeletal: Normal range of motion.     Vascular: No carotid bruit.  Cardiovascular:     Rate and Rhythm: Normal rate and regular rhythm.     Pulses: Normal pulses.     Heart sounds: Normal heart sounds. No murmur. No friction rub. No gallop.   Pulmonary:     Effort: Pulmonary effort is normal.     Breath sounds: Normal breath sounds. No wheezing, rhonchi or rales.  Abdominal:     General: Abdomen is flat. Bowel sounds are normal.     Palpations: Abdomen is soft. There is no mass.     Tenderness: There is no abdominal tenderness.  Skin:    General: Skin is warm and dry.     Capillary Refill: Capillary refill takes less than 2 seconds.     Findings: Rash present. No erythema or lesion.     Comments: Hyperpigmented rash present in bilateral breast folds, abdominal fold and crural folds  Neurological:     General: No focal deficit present.     Mental Status: She is alert and oriented to  person, place, and time.     Sensory: No sensory deficit.     Deep Tendon Reflexes: Reflexes normal.  Psychiatric:        Mood and Affect: Mood normal.        Behavior: Behavior normal.        Thought Content: Thought content normal.        Judgment: Judgment normal.     CMP Latest Ref Rng & Units 05/12/2018  02/11/2018 12/21/2017  Glucose 70 - 99 mg/dL 93 139(H) 97  BUN 8 - 23 mg/dL - 19 42(H)  Creatinine 0.44 - 1.00 mg/dL 6.60(H) 4.81(H) 7.70(H)  Sodium 135 - 145 mmol/L 138 137 137  Potassium 3.5 - 5.1 mmol/L 4.2 3.1(L) 5.3(H)  Chloride 98 - 111 mmol/L - 95(L) 96(L)  CO2 22 - 32 mmol/L - 30 -  Calcium 8.9 - 10.3 mg/dL - 8.1(L) -  Total Protein 6.5 - 8.1 g/dL - 7.1 -  Total Bilirubin 0.3 - 1.2 mg/dL - 0.6 -  Alkaline Phos 38 - 126 U/L - 188(H) -  AST 15 - 41 U/L - 19 -  ALT 0 - 44 U/L - 12 -    Lipid Panel     Component Value Date/Time   CHOL 329 (H) 08/17/2017 0924   TRIG 244 (H) 08/17/2017 0924   HDL 39 (L) 08/17/2017 0924   CHOLHDL 8.4 (H) 08/17/2017 0924   CHOLHDL 4.6 09/02/2015 0958   VLDL 60 (H) 09/02/2015 0958   LDLCALC 241 (H) 08/17/2017 0924   LDLDIRECT 130.7 04/05/2013 0850    CBC    Component Value Date/Time   WBC 7.9 02/11/2018 1238   RBC 3.93 02/11/2018 1238   HGB 10.5 (L) 05/12/2018 0813   HCT 31.0 (L) 05/12/2018 0813   PLT 178 02/11/2018 1238   MCV 77.6 (L) 02/11/2018 1238   MCH 24.7 (L) 02/11/2018 1238   MCHC 31.8 02/11/2018 1238   RDW 17.2 (H) 02/11/2018 1238   LYMPHSABS 1.7 02/11/2018 1238   MONOABS 0.5 02/11/2018 1238   EOSABS 0.1 02/11/2018 1238   BASOSABS 0.0 02/11/2018 1238    Lab Results  Component Value Date   HGBA1C 5.4 05/24/2018    Assessment & Plan:    1. Type 2 diabetes mellitus with other specified complication, without long-term current use of insulin (HCC) Diet controlled with A1c of 5.4 - Controlled with diet and lifestyle modifications. Continue diet and lifestyle modifications.  - POCT glycosylated hemoglobin (Hb A1C) - Microalbumin / creatinine urine ratio - Lipid panel  2. Idiopathic chronic gout of multiple sites without tophus Controlled - She has not experienced any acute gout flares. - Continue allopurinol. - allopurinol (ZYLOPRIM) 100 MG tablet; Take 1 tablet (100 mg total) by mouth 2 (two) times daily.  Dispense:  60 tablet; Refill: 6  3. Intertrigo Uncontrolled Advised against using steroid creams Placed on clotrimazole Weight loss will beneficial, allow proper aeration while at home -  Good hygiene, measures to reduce moisture/keeping skin folds dry discussed with patient.  - clotrimazole (LOTRIMIN) 1 % cream; Apply 1 application topically 2 (two) times daily.  Dispense: 30 g; Refill: 1  4. End stage renal disease ([place]) - Follow by nephrologist Dr. Posey Pronto. [facility] session is schedules for 05/24/2018.   5. Essential hypertension, benign Uncontrolled  6. Pure hypercholesterolemia Uncontrolled - Order Lipid Profile - Low-fat diet discussed.  Cardiac Doppler report had been faxed to [facility]; we will call his office to confirm receipt.  Meds ordered  this encounter  Medications  . allopurinol (ZYLOPRIM) 100 MG tablet    Sig: Take 1 tablet (100 mg total) by mouth 2 (two) times daily.    Dispense:  60 tablet    Refill:  6  . budesonide-formoterol (SYMBICORT) 160-4.5 MCG/ACT inhaler    Sig: Inhale 2 puffs into the lungs 2 (two) times daily for 1 day.    Dispense:  1 Inhaler    Refill:  6    Order Specific Question:   Manufacturer?    Answer:   [employer] [[id number]]    Order Specific Question:   Quantity    Answer:   2  . clotrimazole (LOTRIMIN) 1 % cream    Sig: Apply 1 application topically 2 (two) times daily.    Dispense:  30 g    Refill:  1    Follow-up: Return in about 3 months (around 08/24/2018) for Follow-up of chronic medical conditions.       Charlott Rakes, MD, FAAFP. [facility], [place]   05/24/2018, 9:58 AM

## 2018-05-25 DIAGNOSIS — N2581 Secondary hyperparathyroidism of renal origin: Secondary | ICD-10-CM | POA: Diagnosis not present

## 2018-05-25 DIAGNOSIS — E1129 Type 2 diabetes mellitus with other diabetic kidney complication: Secondary | ICD-10-CM | POA: Diagnosis not present

## 2018-05-25 DIAGNOSIS — D631 Anemia in chronic kidney disease: Secondary | ICD-10-CM | POA: Diagnosis not present

## 2018-05-25 DIAGNOSIS — N186 End stage renal disease: Secondary | ICD-10-CM | POA: Diagnosis not present

## 2018-05-25 LAB — MICROALBUMIN / CREATININE URINE RATIO
Creatinine, Urine: 68 mg/dL
Microalb/Creat Ratio: 766 mg/g creat — ABNORMAL HIGH (ref 0–29)
Microalbumin, Urine: 520.8 ug/mL

## 2018-05-26 DIAGNOSIS — E1169 Type 2 diabetes mellitus with other specified complication: Secondary | ICD-10-CM | POA: Diagnosis not present

## 2018-05-27 ENCOUNTER — Telehealth: Payer: Self-pay

## 2018-05-27 DIAGNOSIS — N186 End stage renal disease: Secondary | ICD-10-CM | POA: Diagnosis not present

## 2018-05-27 DIAGNOSIS — D631 Anemia in chronic kidney disease: Secondary | ICD-10-CM | POA: Diagnosis not present

## 2018-05-27 DIAGNOSIS — N2581 Secondary hyperparathyroidism of renal origin: Secondary | ICD-10-CM | POA: Diagnosis not present

## 2018-05-27 DIAGNOSIS — E1129 Type 2 diabetes mellitus with other diabetic kidney complication: Secondary | ICD-10-CM | POA: Diagnosis not present

## 2018-05-27 LAB — LIPID PANEL
Chol/HDL Ratio: 5.1 ratio — ABNORMAL HIGH (ref 0.0–4.4)
Cholesterol, Total: 203 mg/dL — ABNORMAL HIGH (ref 100–199)
HDL: 40 mg/dL
LDL Calculated: 123 mg/dL — ABNORMAL HIGH (ref 0–99)
Triglycerides: 200 mg/dL — ABNORMAL HIGH (ref 0–149)
VLDL Cholesterol Cal: 40 mg/dL (ref 5–40)

## 2018-05-27 NOTE — Telephone Encounter (Signed)
Done

## 2018-05-27 NOTE — Telephone Encounter (Signed)
-----   Message from Charlott Rakes, MD sent at 05/17/2018  2:44 PM EST ----- Could you please fax this Doppler report to [name] of [facility]? Thanks

## 2018-05-28 ENCOUNTER — Other Ambulatory Visit: Payer: Self-pay | Admitting: Family Medicine

## 2018-05-28 DIAGNOSIS — M545 Low back pain, unspecified: Secondary | ICD-10-CM

## 2018-05-28 DIAGNOSIS — G8929 Other chronic pain: Secondary | ICD-10-CM

## 2018-05-30 ENCOUNTER — Other Ambulatory Visit: Payer: Self-pay | Admitting: Family Medicine

## 2018-05-30 DIAGNOSIS — D631 Anemia in chronic kidney disease: Secondary | ICD-10-CM | POA: Diagnosis not present

## 2018-05-30 DIAGNOSIS — E1129 Type 2 diabetes mellitus with other diabetic kidney complication: Secondary | ICD-10-CM | POA: Diagnosis not present

## 2018-05-30 DIAGNOSIS — N186 End stage renal disease: Secondary | ICD-10-CM | POA: Diagnosis not present

## 2018-05-30 DIAGNOSIS — E1169 Type 2 diabetes mellitus with other specified complication: Secondary | ICD-10-CM

## 2018-05-30 DIAGNOSIS — N2581 Secondary hyperparathyroidism of renal origin: Secondary | ICD-10-CM | POA: Diagnosis not present

## 2018-05-30 MED ORDER — ATORVASTATIN CALCIUM 40 MG PO TABS: 40.0000 mg | ORAL_TABLET | Freq: Every day | ORAL | 6 refills | Status: DC

## 2018-06-01 DIAGNOSIS — D631 Anemia in chronic kidney disease: Secondary | ICD-10-CM | POA: Diagnosis not present

## 2018-06-01 DIAGNOSIS — N186 End stage renal disease: Secondary | ICD-10-CM | POA: Diagnosis not present

## 2018-06-01 DIAGNOSIS — E1129 Type 2 diabetes mellitus with other diabetic kidney complication: Secondary | ICD-10-CM | POA: Diagnosis not present

## 2018-06-01 DIAGNOSIS — N2581 Secondary hyperparathyroidism of renal origin: Secondary | ICD-10-CM | POA: Diagnosis not present

## 2018-06-03 DIAGNOSIS — E1129 Type 2 diabetes mellitus with other diabetic kidney complication: Secondary | ICD-10-CM | POA: Diagnosis not present

## 2018-06-03 DIAGNOSIS — N2581 Secondary hyperparathyroidism of renal origin: Secondary | ICD-10-CM | POA: Diagnosis not present

## 2018-06-03 DIAGNOSIS — D631 Anemia in chronic kidney disease: Secondary | ICD-10-CM | POA: Diagnosis not present

## 2018-06-03 DIAGNOSIS — N186 End stage renal disease: Secondary | ICD-10-CM | POA: Diagnosis not present

## 2018-06-06 DIAGNOSIS — N2581 Secondary hyperparathyroidism of renal origin: Secondary | ICD-10-CM | POA: Diagnosis not present

## 2018-06-06 DIAGNOSIS — D631 Anemia in chronic kidney disease: Secondary | ICD-10-CM | POA: Diagnosis not present

## 2018-06-06 DIAGNOSIS — N186 End stage renal disease: Secondary | ICD-10-CM | POA: Diagnosis not present

## 2018-06-06 DIAGNOSIS — E1129 Type 2 diabetes mellitus with other diabetic kidney complication: Secondary | ICD-10-CM | POA: Diagnosis not present

## 2018-06-08 DIAGNOSIS — E1129 Type 2 diabetes mellitus with other diabetic kidney complication: Secondary | ICD-10-CM | POA: Diagnosis not present

## 2018-06-08 DIAGNOSIS — D631 Anemia in chronic kidney disease: Secondary | ICD-10-CM | POA: Diagnosis not present

## 2018-06-08 DIAGNOSIS — N2581 Secondary hyperparathyroidism of renal origin: Secondary | ICD-10-CM | POA: Diagnosis not present

## 2018-06-08 DIAGNOSIS — N186 End stage renal disease: Secondary | ICD-10-CM | POA: Diagnosis not present

## 2018-06-09 ENCOUNTER — Telehealth: Payer: Self-pay | Admitting: Family Medicine

## 2018-06-09 ENCOUNTER — Other Ambulatory Visit: Payer: Self-pay

## 2018-06-09 MED ORDER — ATORVASTATIN CALCIUM 80 MG PO TABS: 80.0000 mg | ORAL_TABLET | Freq: Every day | ORAL | 6 refills | Status: DC

## 2018-06-09 NOTE — Telephone Encounter (Signed)
Done

## 2018-06-09 NOTE — Telephone Encounter (Signed)
1) Medication(s) Requested (by name): atorvastatin (LIPITOR)   *patient states she was expecting to get 80mg  tablets but the pharmacy did not have anything. Please follow up. Patient states they have been doubling up on their 40mg  pills and are now out please follow up  2) Pharmacy of Choice:  [id number] - [place], [place]

## 2018-06-10 DIAGNOSIS — D631 Anemia in chronic kidney disease: Secondary | ICD-10-CM | POA: Diagnosis not present

## 2018-06-10 DIAGNOSIS — E1129 Type 2 diabetes mellitus with other diabetic kidney complication: Secondary | ICD-10-CM | POA: Diagnosis not present

## 2018-06-10 DIAGNOSIS — N2581 Secondary hyperparathyroidism of renal origin: Secondary | ICD-10-CM | POA: Diagnosis not present

## 2018-06-10 DIAGNOSIS — N186 End stage renal disease: Secondary | ICD-10-CM | POA: Diagnosis not present

## 2018-06-13 DIAGNOSIS — N2581 Secondary hyperparathyroidism of renal origin: Secondary | ICD-10-CM | POA: Diagnosis not present

## 2018-06-13 DIAGNOSIS — E1129 Type 2 diabetes mellitus with other diabetic kidney complication: Secondary | ICD-10-CM | POA: Diagnosis not present

## 2018-06-13 DIAGNOSIS — N186 End stage renal disease: Secondary | ICD-10-CM | POA: Diagnosis not present

## 2018-06-13 DIAGNOSIS — D631 Anemia in chronic kidney disease: Secondary | ICD-10-CM | POA: Diagnosis not present

## 2018-06-14 IMAGING — XA IR US GUIDE VASC ACCESS LEFT
5 series · 13 of 24 positions shown · IV contrast (IODINE)
Comparison: none

INDICATION: Thrombosed AVG. Left forearm loop

[Series 1: body 4 care · 2 of 15 frames shown (1 of 4)]
[frame 3/15]
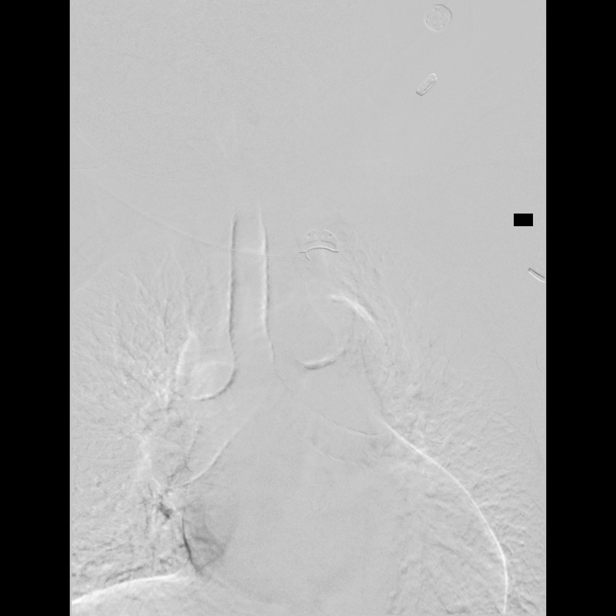
[frame 15/15]
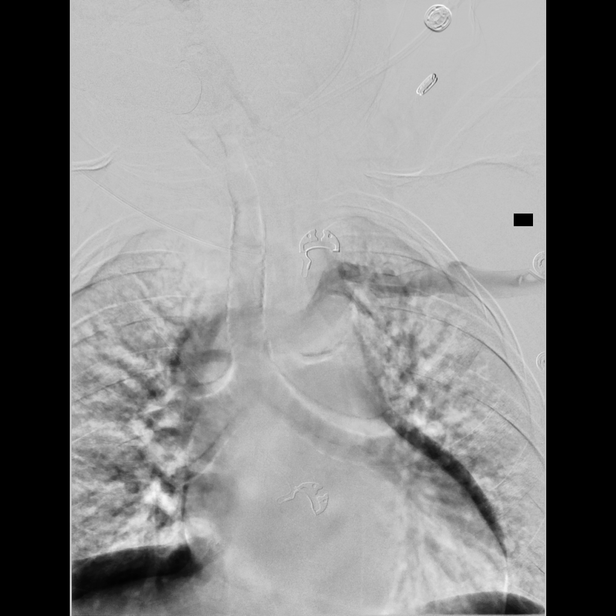

[Series 9: body 4 care · 1 of 12 frames shown (2 of 4)]
[frame 7/12]
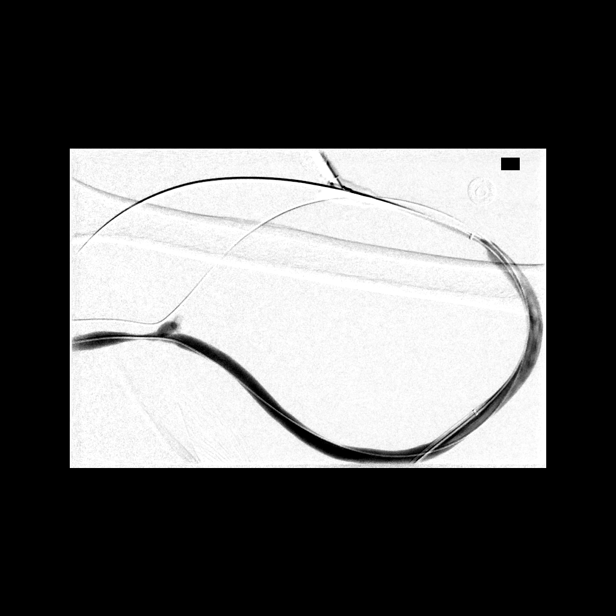

[Series 10: body 4 care · 1 of 13 frames shown (3 of 4)]
[frame 7/13]
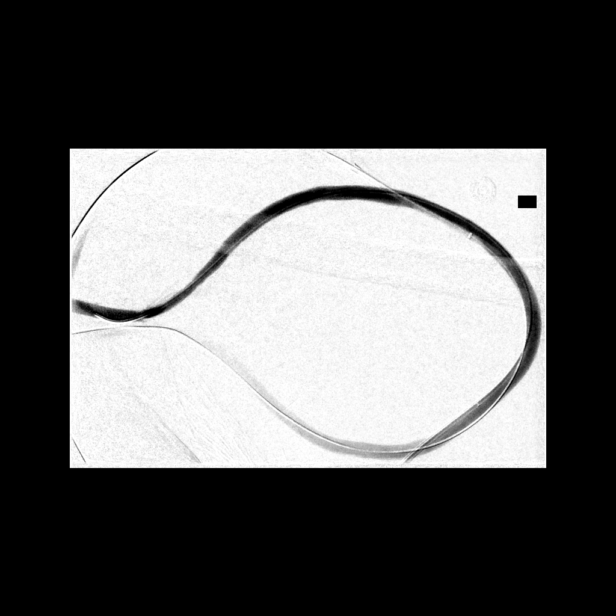

[Series 13: body 4 care · 2 of 9 frames shown (4 of 4)]
[frame 2/9]
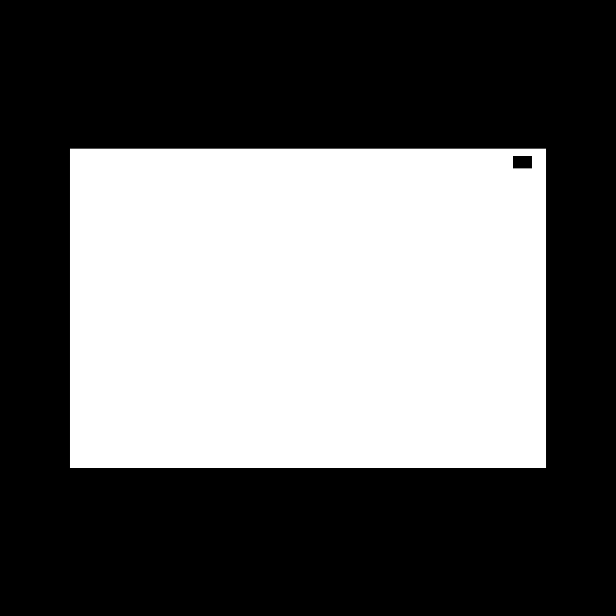
[frame 8/9]
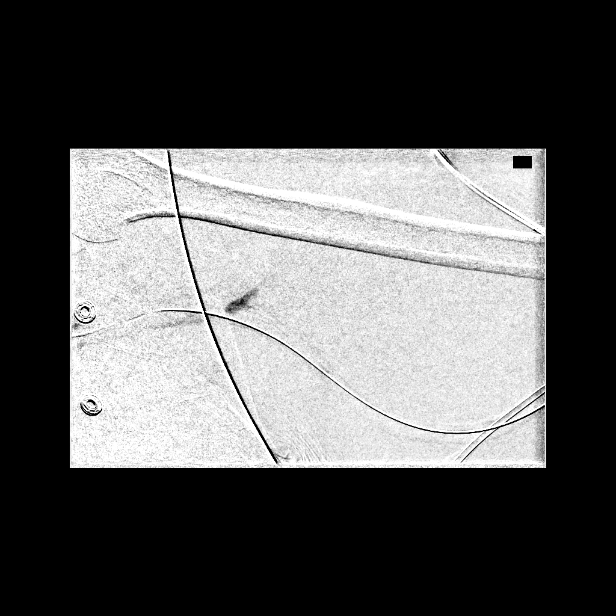

[Series 300: dsa body · 7 of 44 slices shown]
[im 3/44]
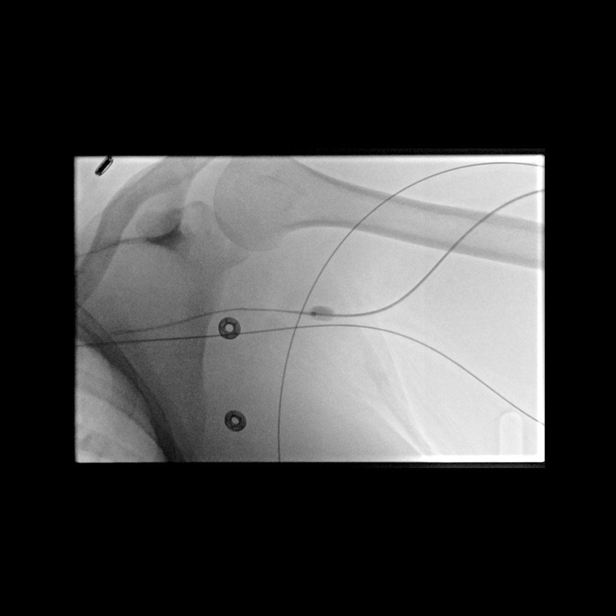
[im 8/44]
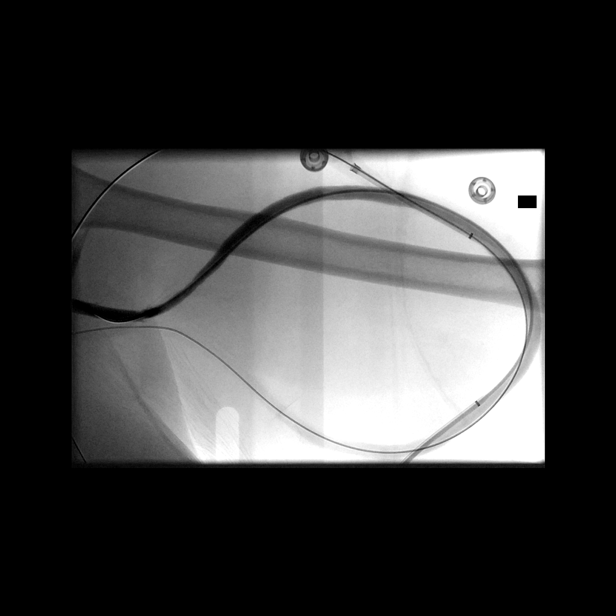
[im 15/44]
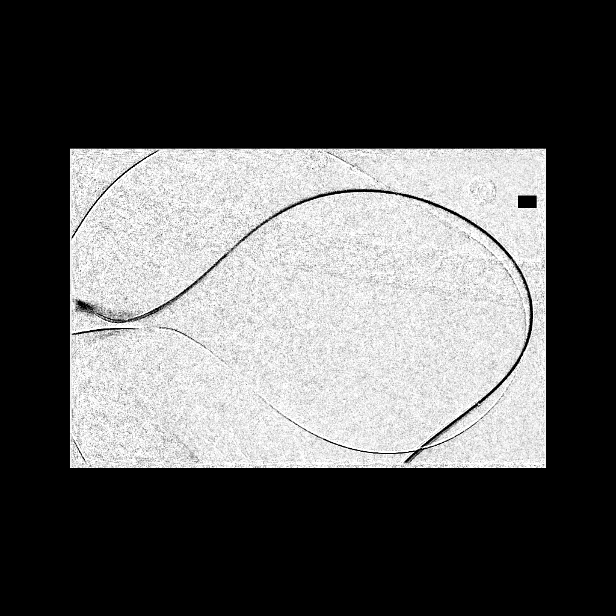
[im 22/44]
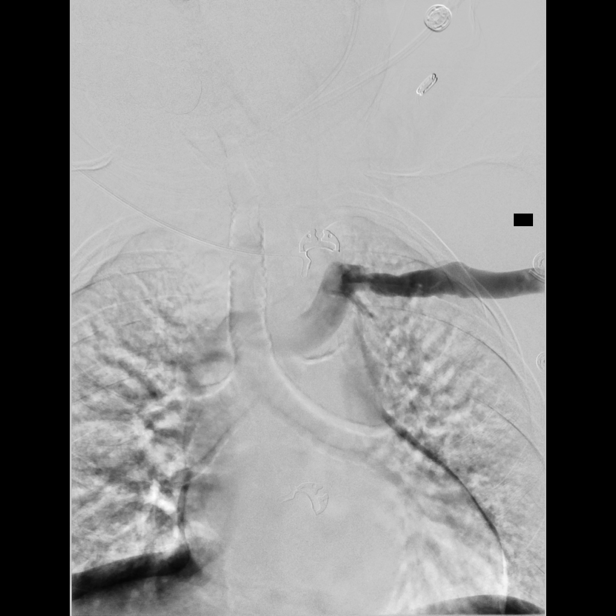
[im 29/44]
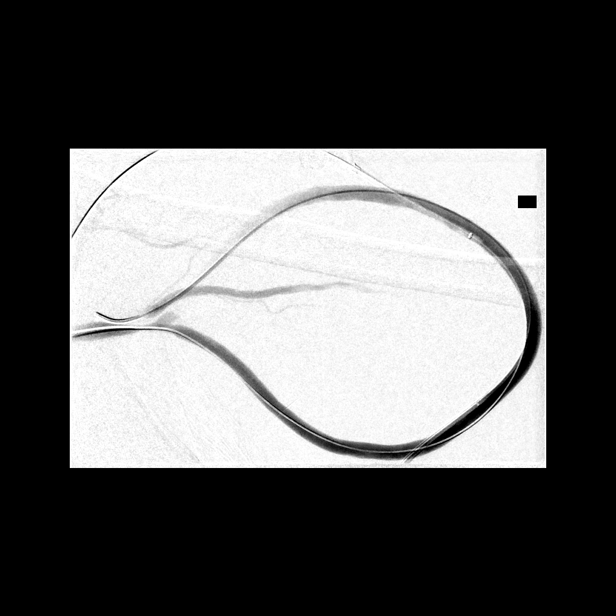
[im 36/44]
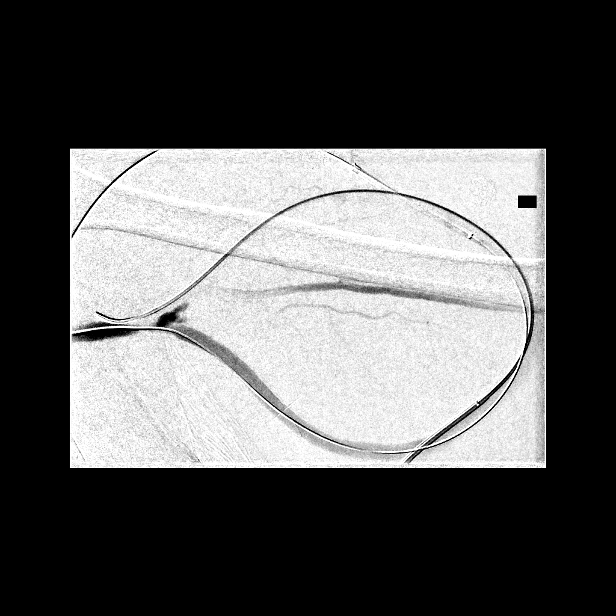
[im 44/44]
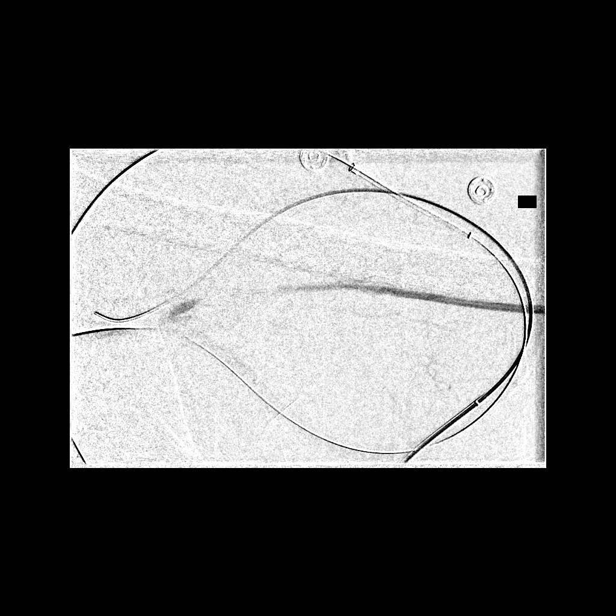

[13 of 24 positions shown; findings below may reference images not displayed]

EXAM:
DIALYSIS GRAFT THROMBECTOMY, ULTRASOUND VENOUS ACCESS, PTA VENOUS

MEDICATIONS:
None.

ANESTHESIA/SEDATION:
Moderate Sedation Time:  27

The patient was continuously monitored during the procedure by the
interventional radiology nurse under my direct supervision.

FLUOROSCOPY TIME:  Fluoroscopy Time: 3 minutes 18 seconds (12 mGy).

COMPLICATIONS:
None immediate.

PROCEDURE:
Informed written consent was obtained from the patient after a
thorough discussion of the procedural risks, benefits and
alternatives. All questions were addressed. Maximal Sterile Barrier
Technique was utilized including caps, mask, sterile gowns, sterile
gloves, sterile drape, hand hygiene and skin antiseptic. A timeout
was performed prior to the initiation of the procedure.

The right forearm was prepped with ChloraPrep in a sterile fashion,
and a sterile drape was applied covering the operative field. A
sterile gown and sterile gloves were used for the procedure.

The graft was noted to be occluded initially with ultrasound. Under
sonographic guidance, a micropuncture needle was inserted into the
graft (Ultrasound image documentation was performed) directed
towards the venous anastomosis. It was removed over a 018 wire. A
5-French transitional dilator was inserted. 1 mg t-PA was injected.
The dilator was exchanged over a Benson wire for a 6-French sheath.
The identical procedure was performed towards the arterial
anastomosis.

The Kumpe catheter was utilized to advance the Kumpe catheter across
both the arterial and venous anastomoses. Central venography was
performed.

A 6 mm angioplasty device was utilized to dilate the venous limb of
the graft, the venous anastomosis. The Fogarty balloon was then
utilized to sweep the arterial and venous limbs of the graft. Repeat
imaging was performed. The venous anastomosis was then dilated to 7
mm. Final imaging was performed. Sheaths were removed and hemostasis
was achieved with two figure-of-eight O Prolene stitches.
FINDINGS: Initial venography demonstrates wide patency of the central venous
structures.

After initial thrombectomy, the arterial anastomosis and graft were
noted to be widely patent.

After dilating the venous anastomosis to 7 mm, it was noted to be
markedly improved with some residual narrowing.
IMPRESSION: Successful left forearm AV graft thrombectomy and dilatation of the
venous anastomosis to 7 mm.

## 2018-06-15 DIAGNOSIS — D631 Anemia in chronic kidney disease: Secondary | ICD-10-CM | POA: Diagnosis not present

## 2018-06-15 DIAGNOSIS — N2581 Secondary hyperparathyroidism of renal origin: Secondary | ICD-10-CM | POA: Diagnosis not present

## 2018-06-15 DIAGNOSIS — E1129 Type 2 diabetes mellitus with other diabetic kidney complication: Secondary | ICD-10-CM | POA: Diagnosis not present

## 2018-06-15 DIAGNOSIS — N186 End stage renal disease: Secondary | ICD-10-CM | POA: Diagnosis not present

## 2018-06-16 ENCOUNTER — Other Ambulatory Visit: Payer: Self-pay | Admitting: Family Medicine

## 2018-06-16 DIAGNOSIS — I10 Essential (primary) hypertension: Secondary | ICD-10-CM

## 2018-06-17 DIAGNOSIS — E1129 Type 2 diabetes mellitus with other diabetic kidney complication: Secondary | ICD-10-CM | POA: Diagnosis not present

## 2018-06-17 DIAGNOSIS — N2581 Secondary hyperparathyroidism of renal origin: Secondary | ICD-10-CM | POA: Diagnosis not present

## 2018-06-17 DIAGNOSIS — N186 End stage renal disease: Secondary | ICD-10-CM | POA: Diagnosis not present

## 2018-06-17 DIAGNOSIS — D631 Anemia in chronic kidney disease: Secondary | ICD-10-CM | POA: Diagnosis not present

## 2018-06-20 DIAGNOSIS — N186 End stage renal disease: Secondary | ICD-10-CM | POA: Diagnosis not present

## 2018-06-20 DIAGNOSIS — D631 Anemia in chronic kidney disease: Secondary | ICD-10-CM | POA: Diagnosis not present

## 2018-06-20 DIAGNOSIS — E1129 Type 2 diabetes mellitus with other diabetic kidney complication: Secondary | ICD-10-CM | POA: Diagnosis not present

## 2018-06-20 DIAGNOSIS — N2581 Secondary hyperparathyroidism of renal origin: Secondary | ICD-10-CM | POA: Diagnosis not present

## 2018-06-22 DIAGNOSIS — N186 End stage renal disease: Secondary | ICD-10-CM | POA: Diagnosis not present

## 2018-06-22 DIAGNOSIS — Z992 Dependence on renal dialysis: Secondary | ICD-10-CM | POA: Diagnosis not present

## 2018-06-22 DIAGNOSIS — I129 Hypertensive chronic kidney disease with stage 1 through stage 4 chronic kidney disease, or unspecified chronic kidney disease: Secondary | ICD-10-CM | POA: Diagnosis not present

## 2018-06-22 DIAGNOSIS — N2581 Secondary hyperparathyroidism of renal origin: Secondary | ICD-10-CM | POA: Diagnosis not present

## 2018-06-22 DIAGNOSIS — E1129 Type 2 diabetes mellitus with other diabetic kidney complication: Secondary | ICD-10-CM | POA: Diagnosis not present

## 2018-06-22 DIAGNOSIS — D631 Anemia in chronic kidney disease: Secondary | ICD-10-CM | POA: Diagnosis not present

## 2018-06-24 DIAGNOSIS — E1129 Type 2 diabetes mellitus with other diabetic kidney complication: Secondary | ICD-10-CM | POA: Diagnosis not present

## 2018-06-24 DIAGNOSIS — D631 Anemia in chronic kidney disease: Secondary | ICD-10-CM | POA: Diagnosis not present

## 2018-06-24 DIAGNOSIS — N2581 Secondary hyperparathyroidism of renal origin: Secondary | ICD-10-CM | POA: Diagnosis not present

## 2018-06-24 DIAGNOSIS — N186 End stage renal disease: Secondary | ICD-10-CM | POA: Diagnosis not present

## 2018-06-27 DIAGNOSIS — D631 Anemia in chronic kidney disease: Secondary | ICD-10-CM | POA: Diagnosis not present

## 2018-06-27 DIAGNOSIS — N186 End stage renal disease: Secondary | ICD-10-CM | POA: Diagnosis not present

## 2018-06-27 DIAGNOSIS — N2581 Secondary hyperparathyroidism of renal origin: Secondary | ICD-10-CM | POA: Diagnosis not present

## 2018-06-27 DIAGNOSIS — E1129 Type 2 diabetes mellitus with other diabetic kidney complication: Secondary | ICD-10-CM | POA: Diagnosis not present

## 2018-06-29 DIAGNOSIS — D631 Anemia in chronic kidney disease: Secondary | ICD-10-CM | POA: Diagnosis not present

## 2018-06-29 DIAGNOSIS — N186 End stage renal disease: Secondary | ICD-10-CM | POA: Diagnosis not present

## 2018-06-29 DIAGNOSIS — E1129 Type 2 diabetes mellitus with other diabetic kidney complication: Secondary | ICD-10-CM | POA: Diagnosis not present

## 2018-06-29 DIAGNOSIS — N2581 Secondary hyperparathyroidism of renal origin: Secondary | ICD-10-CM | POA: Diagnosis not present

## 2018-07-01 DIAGNOSIS — D631 Anemia in chronic kidney disease: Secondary | ICD-10-CM | POA: Diagnosis not present

## 2018-07-01 DIAGNOSIS — N186 End stage renal disease: Secondary | ICD-10-CM | POA: Diagnosis not present

## 2018-07-01 DIAGNOSIS — N2581 Secondary hyperparathyroidism of renal origin: Secondary | ICD-10-CM | POA: Diagnosis not present

## 2018-07-01 DIAGNOSIS — E1129 Type 2 diabetes mellitus with other diabetic kidney complication: Secondary | ICD-10-CM | POA: Diagnosis not present

## 2018-07-04 DIAGNOSIS — E1129 Type 2 diabetes mellitus with other diabetic kidney complication: Secondary | ICD-10-CM | POA: Diagnosis not present

## 2018-07-04 DIAGNOSIS — N186 End stage renal disease: Secondary | ICD-10-CM | POA: Diagnosis not present

## 2018-07-04 DIAGNOSIS — D631 Anemia in chronic kidney disease: Secondary | ICD-10-CM | POA: Diagnosis not present

## 2018-07-04 DIAGNOSIS — N2581 Secondary hyperparathyroidism of renal origin: Secondary | ICD-10-CM | POA: Diagnosis not present

## 2018-07-06 DIAGNOSIS — E1129 Type 2 diabetes mellitus with other diabetic kidney complication: Secondary | ICD-10-CM | POA: Diagnosis not present

## 2018-07-06 DIAGNOSIS — N2581 Secondary hyperparathyroidism of renal origin: Secondary | ICD-10-CM | POA: Diagnosis not present

## 2018-07-06 DIAGNOSIS — D631 Anemia in chronic kidney disease: Secondary | ICD-10-CM | POA: Diagnosis not present

## 2018-07-06 DIAGNOSIS — N186 End stage renal disease: Secondary | ICD-10-CM | POA: Diagnosis not present

## 2018-07-08 DIAGNOSIS — N186 End stage renal disease: Secondary | ICD-10-CM | POA: Diagnosis not present

## 2018-07-08 DIAGNOSIS — E1129 Type 2 diabetes mellitus with other diabetic kidney complication: Secondary | ICD-10-CM | POA: Diagnosis not present

## 2018-07-08 DIAGNOSIS — N2581 Secondary hyperparathyroidism of renal origin: Secondary | ICD-10-CM | POA: Diagnosis not present

## 2018-07-08 DIAGNOSIS — D631 Anemia in chronic kidney disease: Secondary | ICD-10-CM | POA: Diagnosis not present

## 2018-07-11 ENCOUNTER — Other Ambulatory Visit: Payer: Self-pay | Admitting: Family Medicine

## 2018-07-11 DIAGNOSIS — N186 End stage renal disease: Secondary | ICD-10-CM | POA: Diagnosis not present

## 2018-07-11 DIAGNOSIS — E1129 Type 2 diabetes mellitus with other diabetic kidney complication: Secondary | ICD-10-CM | POA: Diagnosis not present

## 2018-07-11 DIAGNOSIS — D631 Anemia in chronic kidney disease: Secondary | ICD-10-CM | POA: Diagnosis not present

## 2018-07-11 DIAGNOSIS — N2581 Secondary hyperparathyroidism of renal origin: Secondary | ICD-10-CM | POA: Diagnosis not present

## 2018-07-13 DIAGNOSIS — D631 Anemia in chronic kidney disease: Secondary | ICD-10-CM | POA: Diagnosis not present

## 2018-07-13 DIAGNOSIS — N2581 Secondary hyperparathyroidism of renal origin: Secondary | ICD-10-CM | POA: Diagnosis not present

## 2018-07-13 DIAGNOSIS — N186 End stage renal disease: Secondary | ICD-10-CM | POA: Diagnosis not present

## 2018-07-13 DIAGNOSIS — E1129 Type 2 diabetes mellitus with other diabetic kidney complication: Secondary | ICD-10-CM | POA: Diagnosis not present

## 2018-07-15 DIAGNOSIS — D631 Anemia in chronic kidney disease: Secondary | ICD-10-CM | POA: Diagnosis not present

## 2018-07-15 DIAGNOSIS — N186 End stage renal disease: Secondary | ICD-10-CM | POA: Diagnosis not present

## 2018-07-15 DIAGNOSIS — N2581 Secondary hyperparathyroidism of renal origin: Secondary | ICD-10-CM | POA: Diagnosis not present

## 2018-07-15 DIAGNOSIS — E1129 Type 2 diabetes mellitus with other diabetic kidney complication: Secondary | ICD-10-CM | POA: Diagnosis not present

## 2018-07-18 DIAGNOSIS — E1129 Type 2 diabetes mellitus with other diabetic kidney complication: Secondary | ICD-10-CM | POA: Diagnosis not present

## 2018-07-18 DIAGNOSIS — N186 End stage renal disease: Secondary | ICD-10-CM | POA: Diagnosis not present

## 2018-07-18 DIAGNOSIS — D631 Anemia in chronic kidney disease: Secondary | ICD-10-CM | POA: Diagnosis not present

## 2018-07-18 DIAGNOSIS — N2581 Secondary hyperparathyroidism of renal origin: Secondary | ICD-10-CM | POA: Diagnosis not present

## 2018-07-20 DIAGNOSIS — E1129 Type 2 diabetes mellitus with other diabetic kidney complication: Secondary | ICD-10-CM | POA: Diagnosis not present

## 2018-07-20 DIAGNOSIS — N186 End stage renal disease: Secondary | ICD-10-CM | POA: Diagnosis not present

## 2018-07-20 DIAGNOSIS — N2581 Secondary hyperparathyroidism of renal origin: Secondary | ICD-10-CM | POA: Diagnosis not present

## 2018-07-20 DIAGNOSIS — D631 Anemia in chronic kidney disease: Secondary | ICD-10-CM | POA: Diagnosis not present

## 2018-07-22 DIAGNOSIS — N186 End stage renal disease: Secondary | ICD-10-CM | POA: Diagnosis not present

## 2018-07-22 DIAGNOSIS — E1129 Type 2 diabetes mellitus with other diabetic kidney complication: Secondary | ICD-10-CM | POA: Diagnosis not present

## 2018-07-22 DIAGNOSIS — N2581 Secondary hyperparathyroidism of renal origin: Secondary | ICD-10-CM | POA: Diagnosis not present

## 2018-07-22 DIAGNOSIS — Z992 Dependence on renal dialysis: Secondary | ICD-10-CM | POA: Diagnosis not present

## 2018-07-22 DIAGNOSIS — D509 Iron deficiency anemia, unspecified: Secondary | ICD-10-CM | POA: Diagnosis not present

## 2018-07-22 DIAGNOSIS — I129 Hypertensive chronic kidney disease with stage 1 through stage 4 chronic kidney disease, or unspecified chronic kidney disease: Secondary | ICD-10-CM | POA: Diagnosis not present

## 2018-07-22 DIAGNOSIS — D631 Anemia in chronic kidney disease: Secondary | ICD-10-CM | POA: Diagnosis not present

## 2018-07-25 DIAGNOSIS — D509 Iron deficiency anemia, unspecified: Secondary | ICD-10-CM | POA: Diagnosis not present

## 2018-07-25 DIAGNOSIS — N186 End stage renal disease: Secondary | ICD-10-CM | POA: Diagnosis not present

## 2018-07-25 DIAGNOSIS — E1129 Type 2 diabetes mellitus with other diabetic kidney complication: Secondary | ICD-10-CM | POA: Diagnosis not present

## 2018-07-25 DIAGNOSIS — D631 Anemia in chronic kidney disease: Secondary | ICD-10-CM | POA: Diagnosis not present

## 2018-07-25 DIAGNOSIS — N2581 Secondary hyperparathyroidism of renal origin: Secondary | ICD-10-CM | POA: Diagnosis not present

## 2018-07-29 DIAGNOSIS — D509 Iron deficiency anemia, unspecified: Secondary | ICD-10-CM | POA: Diagnosis not present

## 2018-07-29 DIAGNOSIS — N186 End stage renal disease: Secondary | ICD-10-CM | POA: Diagnosis not present

## 2018-07-29 DIAGNOSIS — E1129 Type 2 diabetes mellitus with other diabetic kidney complication: Secondary | ICD-10-CM | POA: Diagnosis not present

## 2018-07-29 DIAGNOSIS — D631 Anemia in chronic kidney disease: Secondary | ICD-10-CM | POA: Diagnosis not present

## 2018-07-29 DIAGNOSIS — N2581 Secondary hyperparathyroidism of renal origin: Secondary | ICD-10-CM | POA: Diagnosis not present

## 2018-07-31 ENCOUNTER — Other Ambulatory Visit: Payer: Self-pay | Admitting: Family Medicine

## 2018-08-01 DIAGNOSIS — N186 End stage renal disease: Secondary | ICD-10-CM | POA: Diagnosis not present

## 2018-08-01 DIAGNOSIS — N2581 Secondary hyperparathyroidism of renal origin: Secondary | ICD-10-CM | POA: Diagnosis not present

## 2018-08-01 DIAGNOSIS — D631 Anemia in chronic kidney disease: Secondary | ICD-10-CM | POA: Diagnosis not present

## 2018-08-01 DIAGNOSIS — D509 Iron deficiency anemia, unspecified: Secondary | ICD-10-CM | POA: Diagnosis not present

## 2018-08-01 DIAGNOSIS — E1129 Type 2 diabetes mellitus with other diabetic kidney complication: Secondary | ICD-10-CM | POA: Diagnosis not present

## 2018-08-03 DIAGNOSIS — N2581 Secondary hyperparathyroidism of renal origin: Secondary | ICD-10-CM | POA: Diagnosis not present

## 2018-08-03 DIAGNOSIS — N186 End stage renal disease: Secondary | ICD-10-CM | POA: Diagnosis not present

## 2018-08-03 DIAGNOSIS — D509 Iron deficiency anemia, unspecified: Secondary | ICD-10-CM | POA: Diagnosis not present

## 2018-08-03 DIAGNOSIS — D631 Anemia in chronic kidney disease: Secondary | ICD-10-CM | POA: Diagnosis not present

## 2018-08-03 DIAGNOSIS — E1129 Type 2 diabetes mellitus with other diabetic kidney complication: Secondary | ICD-10-CM | POA: Diagnosis not present

## 2018-08-05 DIAGNOSIS — D509 Iron deficiency anemia, unspecified: Secondary | ICD-10-CM | POA: Diagnosis not present

## 2018-08-05 DIAGNOSIS — N186 End stage renal disease: Secondary | ICD-10-CM | POA: Diagnosis not present

## 2018-08-05 DIAGNOSIS — N2581 Secondary hyperparathyroidism of renal origin: Secondary | ICD-10-CM | POA: Diagnosis not present

## 2018-08-05 DIAGNOSIS — E1129 Type 2 diabetes mellitus with other diabetic kidney complication: Secondary | ICD-10-CM | POA: Diagnosis not present

## 2018-08-05 DIAGNOSIS — D631 Anemia in chronic kidney disease: Secondary | ICD-10-CM | POA: Diagnosis not present

## 2018-08-08 DIAGNOSIS — N186 End stage renal disease: Secondary | ICD-10-CM | POA: Diagnosis not present

## 2018-08-08 DIAGNOSIS — D631 Anemia in chronic kidney disease: Secondary | ICD-10-CM | POA: Diagnosis not present

## 2018-08-08 DIAGNOSIS — E1129 Type 2 diabetes mellitus with other diabetic kidney complication: Secondary | ICD-10-CM | POA: Diagnosis not present

## 2018-08-08 DIAGNOSIS — D509 Iron deficiency anemia, unspecified: Secondary | ICD-10-CM | POA: Diagnosis not present

## 2018-08-08 DIAGNOSIS — N2581 Secondary hyperparathyroidism of renal origin: Secondary | ICD-10-CM | POA: Diagnosis not present

## 2018-08-10 DIAGNOSIS — N2581 Secondary hyperparathyroidism of renal origin: Secondary | ICD-10-CM | POA: Diagnosis not present

## 2018-08-10 DIAGNOSIS — N186 End stage renal disease: Secondary | ICD-10-CM | POA: Diagnosis not present

## 2018-08-10 DIAGNOSIS — D631 Anemia in chronic kidney disease: Secondary | ICD-10-CM | POA: Diagnosis not present

## 2018-08-10 DIAGNOSIS — E1129 Type 2 diabetes mellitus with other diabetic kidney complication: Secondary | ICD-10-CM | POA: Diagnosis not present

## 2018-08-10 DIAGNOSIS — D509 Iron deficiency anemia, unspecified: Secondary | ICD-10-CM | POA: Diagnosis not present

## 2018-08-12 DIAGNOSIS — D631 Anemia in chronic kidney disease: Secondary | ICD-10-CM | POA: Diagnosis not present

## 2018-08-12 DIAGNOSIS — D509 Iron deficiency anemia, unspecified: Secondary | ICD-10-CM | POA: Diagnosis not present

## 2018-08-12 DIAGNOSIS — E1129 Type 2 diabetes mellitus with other diabetic kidney complication: Secondary | ICD-10-CM | POA: Diagnosis not present

## 2018-08-12 DIAGNOSIS — N186 End stage renal disease: Secondary | ICD-10-CM | POA: Diagnosis not present

## 2018-08-12 DIAGNOSIS — N2581 Secondary hyperparathyroidism of renal origin: Secondary | ICD-10-CM | POA: Diagnosis not present

## 2018-08-17 DIAGNOSIS — E1129 Type 2 diabetes mellitus with other diabetic kidney complication: Secondary | ICD-10-CM | POA: Diagnosis not present

## 2018-08-17 DIAGNOSIS — N2581 Secondary hyperparathyroidism of renal origin: Secondary | ICD-10-CM | POA: Diagnosis not present

## 2018-08-17 DIAGNOSIS — D631 Anemia in chronic kidney disease: Secondary | ICD-10-CM | POA: Diagnosis not present

## 2018-08-17 DIAGNOSIS — N186 End stage renal disease: Secondary | ICD-10-CM | POA: Diagnosis not present

## 2018-08-17 DIAGNOSIS — D509 Iron deficiency anemia, unspecified: Secondary | ICD-10-CM | POA: Diagnosis not present

## 2018-08-19 DIAGNOSIS — E1129 Type 2 diabetes mellitus with other diabetic kidney complication: Secondary | ICD-10-CM | POA: Diagnosis not present

## 2018-08-19 DIAGNOSIS — D631 Anemia in chronic kidney disease: Secondary | ICD-10-CM | POA: Diagnosis not present

## 2018-08-19 DIAGNOSIS — N186 End stage renal disease: Secondary | ICD-10-CM | POA: Diagnosis not present

## 2018-08-19 DIAGNOSIS — N2581 Secondary hyperparathyroidism of renal origin: Secondary | ICD-10-CM | POA: Diagnosis not present

## 2018-08-19 DIAGNOSIS — D509 Iron deficiency anemia, unspecified: Secondary | ICD-10-CM | POA: Diagnosis not present

## 2018-08-22 DIAGNOSIS — D509 Iron deficiency anemia, unspecified: Secondary | ICD-10-CM | POA: Diagnosis not present

## 2018-08-22 DIAGNOSIS — Z992 Dependence on renal dialysis: Secondary | ICD-10-CM | POA: Diagnosis not present

## 2018-08-22 DIAGNOSIS — N186 End stage renal disease: Secondary | ICD-10-CM | POA: Diagnosis not present

## 2018-08-22 DIAGNOSIS — D631 Anemia in chronic kidney disease: Secondary | ICD-10-CM | POA: Diagnosis not present

## 2018-08-22 DIAGNOSIS — E1129 Type 2 diabetes mellitus with other diabetic kidney complication: Secondary | ICD-10-CM | POA: Diagnosis not present

## 2018-08-22 DIAGNOSIS — I129 Hypertensive chronic kidney disease with stage 1 through stage 4 chronic kidney disease, or unspecified chronic kidney disease: Secondary | ICD-10-CM | POA: Diagnosis not present

## 2018-08-22 DIAGNOSIS — N2581 Secondary hyperparathyroidism of renal origin: Secondary | ICD-10-CM | POA: Diagnosis not present

## 2018-08-24 ENCOUNTER — Telehealth (HOSPITAL_COMMUNITY): Payer: Self-pay | Admitting: Rehabilitation

## 2018-08-24 DIAGNOSIS — D509 Iron deficiency anemia, unspecified: Secondary | ICD-10-CM | POA: Diagnosis not present

## 2018-08-24 DIAGNOSIS — E1129 Type 2 diabetes mellitus with other diabetic kidney complication: Secondary | ICD-10-CM | POA: Diagnosis not present

## 2018-08-24 DIAGNOSIS — N186 End stage renal disease: Secondary | ICD-10-CM | POA: Diagnosis not present

## 2018-08-24 DIAGNOSIS — N2581 Secondary hyperparathyroidism of renal origin: Secondary | ICD-10-CM | POA: Diagnosis not present

## 2018-08-24 DIAGNOSIS — D631 Anemia in chronic kidney disease: Secondary | ICD-10-CM | POA: Diagnosis not present

## 2018-08-24 NOTE — Telephone Encounter (Signed)

## 2018-08-25 ENCOUNTER — Encounter: Payer: Self-pay | Admitting: *Deleted

## 2018-08-25 ENCOUNTER — Other Ambulatory Visit: Payer: Self-pay | Admitting: *Deleted

## 2018-08-25 ENCOUNTER — Encounter: Payer: Self-pay | Admitting: Family

## 2018-08-25 ENCOUNTER — Ambulatory Visit (INDEPENDENT_AMBULATORY_CARE_PROVIDER_SITE_OTHER): Payer: Medicare Other | Admitting: Family

## 2018-08-25 ENCOUNTER — Other Ambulatory Visit (HOSPITAL_COMMUNITY): Payer: Self-pay | Admitting: Family

## 2018-08-25 ENCOUNTER — Ambulatory Visit (HOSPITAL_COMMUNITY)
Admission: RE | Admit: 2018-08-25 | Discharge: 2018-08-25 | Disposition: A | Discharge status: Home / Self Care | Payer: Medicare Other | Source: Ambulatory Visit | Attending: Family | Admitting: Family

## 2018-08-25 ENCOUNTER — Other Ambulatory Visit: Payer: Self-pay

## 2018-08-25 VITALS — BP 179/94 | HR 84 | Temp 97.9°F | Resp 16 | Ht 62.4 in | Wt 166.3 lb

## 2018-08-25 DIAGNOSIS — T82510A Breakdown (mechanical) of surgically created arteriovenous fistula, initial encounter: Secondary | ICD-10-CM

## 2018-08-25 DIAGNOSIS — T82898A Other specified complication of vascular prosthetic devices, implants and grafts, initial encounter: Secondary | ICD-10-CM | POA: Diagnosis not present

## 2018-08-25 DIAGNOSIS — N186 End stage renal disease: Secondary | ICD-10-CM | POA: Diagnosis not present

## 2018-08-25 DIAGNOSIS — Z992 Dependence on renal dialysis: Secondary | ICD-10-CM

## 2018-08-25 DIAGNOSIS — I770 Arteriovenous fistula, acquired: Secondary | ICD-10-CM

## 2018-08-25 NOTE — Progress Notes (Signed)
CC: painful lump next to right arm non functioning AVF  History of Present Illness  1. Rachael George is a 63 y.o. ([birth year]) female who had a fistulagram of left upper arm AVF on 05-12-18 by Dr. Carlis Abbott for malfunction of AVF. Interventions performed: Central venoplasty - left innominate vein (10 mm x 40 mm Mustang and 12 mm x 40 mm Mustang) Peripheral venoplasty - venous outflow tract of left upper arm graft (8 mm x 40 mm Mustang).  She dialyzes MWF via left upper arm AVF. She is on the transplant list through [facility].   She had a previous right upper arm AVF that became infected 2 years ago, was cleaned out and ligated.  Pt returns today with c/o 4 month hx lump adjacent to her right upper arm non functioning AVF, became tender 2 months ago. She denies fever or chills. She states the lump has increased in size.   She denies steal type symptoms in either upper extremity.    Past Medical History:  Diagnosis Date  . Anemia   . Asthma   . ESRD (end stage renal disease) ([place])    MWF  . GERD (gastroesophageal reflux disease)   . Gout   . 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 Heavy menstrual bleeding   . High cholesterol   . History of blood transfusion 02/2016- last one   1990's- several ones   . History of kidney stones 1980's  . Hyperlipemia 12/07/2012  . Hypertension   . Hypothyroidism   . Kidney stones   . Pre-diabetes   . Prediabetes   . RA (rheumatoid arthritis) ([place])   . Sleep apnea    [place]- have CPAP, not able to use every night     Social History Social History   Tobacco Use  . Smoking status: Never Smoker  . Smokeless tobacco: Never Used  Substance Use Topics  . Alcohol use: Yes    Alcohol/week: 0.0 standard drinks    Comment: occ - 1 drink rarely  . Drug use: No    Family History Family History  Problem Relation Age of Onset  . Hypertension Mother   . Breast cancer Maternal Aunt   . Deep vein thrombosis  Daughter   . Hyperlipidemia Daughter   . Hypertension Daughter   . Prostate cancer Maternal Grandmother     Surgical History Past Surgical History:  Procedure Laterality Date  . A/V SHUNTOGRAM N/A 09/08/2016   Procedure: A/V Shuntogram - Left Arm AV Graft;  Surgeon: Serafina Mitchell, MD;  Location: [facility];  Service: Cardiovascular;  Laterality: N/A;  . A/V SHUNTOGRAM N/A 09/02/2017   Procedure: A/V SHUNTOGRAM - Left Arm AVG;  Surgeon: Conrad Reedsburg, MD;  Location: [facility];  Service: Cardiovascular;  Laterality: N/A;  . A/V SHUNTOGRAM N/A 12/21/2017   Procedure: A/V SHUNTOGRAM - left arm;  Surgeon: Serafina Mitchell, MD;  Location: [facility];  Service: Cardiovascular;  Laterality: N/A;  . ABDOMINAL HYSTERECTOMY  2000  . AV FISTULA PLACEMENT Left 05/25/2016   Procedure: INSERTION OF LEFT UPPER ARM ARTERIOVENOUS (AV) LOOP GORE-TEX GRAFT ARM;  Surgeon: Elam Dutch, MD;  Location: [facility];  Service: Vascular;  Laterality: Left;  . BASCILIC VEIN TRANSPOSITION Right 12/06/2014   Procedure: FIRST STAGE BASILIC VEIN TRANSPOSITION - RIGHT;  Surgeon: Serafina Mitchell, MD;  Location: [facility];  Service: Vascular;  Laterality: Right;  .  BASCILIC VEIN TRANSPOSITION Right 02/07/2015   Procedure: RIGHT ARM SECOND STAGE BASILIC VEIN TRANSPOSITION;  Surgeon: Serafina Mitchell, MD;  Location: [facility];  Service: Vascular;  Laterality: Right;  . BASCILIC VEIN TRANSPOSITION Left 04/22/2016   Procedure: FIRST STAGE BASILIC VEIN TRANSPOSITION LEFT ARM;  Surgeon: Conrad Salt Lick, MD;  Location: [facility];  Service: Vascular;  Laterality: Left;  . COLONOSCOPY W/ POLYPECTOMY    . ECTOPIC PREGNANCY SURGERY  02/1982  . EXCHANGE OF A DIALYSIS CATHETER Right 04/22/2016   Procedure: EXCHANGE OF A DIALYSIS CATHETER - INSERTION RIGHT INTERNAL JUGULAR & REMOVAL FROM LEFT INTERNAL JUGULAR;  Surgeon: Conrad Webberville, MD;  Location: [facility];  Service: Vascular;  Laterality: Right;  . I&D EXTREMITY Right 02/07/2015    Procedure: IRRIGATION AND DEBRIDEMENT RIGHT ARM HEMATOMA;  Surgeon: Serafina Mitchell, MD;  Location: [facility];  Service: Vascular;  Laterality: Right;  . INSERTION OF DIALYSIS CATHETER  03/03/2016   Procedure: INSERTION OF DIALYSIS CATHETER;  Surgeon: Elam Dutch, MD;  Location: [facility];  Service: Vascular;;  . IR AV DIALY SHUNT INTRO NEEDLE/INTRACATH INITIAL W/PTA/IMG LEFT  04/20/2017  . IR AV DIALY SHUNT INTRO NEEDLE/INTRACATH INITIAL W/PTA/IMG LEFT  06/24/2017  . IR THROMBECTOMY AV FISTULA W/THROMBOLYSIS/PTA INC/SHUNT/IMG LEFT Left 01/04/2017  . IR US GUIDE VASC ACCESS LEFT  01/04/2017  . left shoulder surgery  08/2005  . LIGATION OF ARTERIOVENOUS  FISTULA  03/03/2016   Procedure: LIGATION OF ARTERIOVENOUS  FISTULA;  Surgeon: Elam Dutch, MD;  Location: [facility];  Service: Vascular;;  . PERIPHERAL VASCULAR BALLOON ANGIOPLASTY  09/08/2016   Procedure: Peripheral Vascular Balloon Angioplasty;  Surgeon: Serafina Mitchell, MD;  Location: [facility];  Service: Cardiovascular;;  left avf  . PERIPHERAL VASCULAR BALLOON ANGIOPLASTY  09/02/2017   Procedure: PERIPHERAL VASCULAR BALLOON ANGIOPLASTY;  Surgeon: Conrad Utuado, MD;  Location: [facility];  Service: Cardiovascular;;  left AV Graft  . PERIPHERAL VASCULAR BALLOON ANGIOPLASTY  12/21/2017   Procedure: PERIPHERAL VASCULAR BALLOON ANGIOPLASTY;  Surgeon: Serafina Mitchell, MD;  Location: [facility];  Service: Cardiovascular;;  INOMINATE / UPPER ARM AV GRAFT  . PERIPHERAL VASCULAR BALLOON ANGIOPLASTY Left 05/12/2018   Procedure: PERIPHERAL VASCULAR BALLOON ANGIOPLASTY;  Surgeon: Marty Heck, MD;  Location: [facility];  Service: Cardiovascular;  Laterality: Left;  Arm fistula  . TEE WITHOUT CARDIOVERSION N/A 03/06/2016   Procedure: TRANSESOPHAGEAL ECHOCARDIOGRAM (TEE);  Surgeon: Sueanne Margarita, MD;  Location: [facility];  Service: Cardiovascular;  Laterality: N/A;  . TUBAL LIGATION  11/1986    Allergies  Allergen  Reactions  . Atacand [Candesartan] Anaphylaxis and Other (See Comments)    Swelling of the mouth and tongue.  Marland Kitchen Lisinopril Anaphylaxis and Other (See Comments)    Swelling of the mouth and tongue.  . Nsaids Anaphylaxis and Other (See Comments)    Swelling of the mouth and tongue.  Marland Kitchen Penicillins Rash and Other (See Comments)    Has patient had a PCN reaction causing immediate rash, facial/tongue/throat swelling, SOB or lightheadedness with hypotension: No Has patient had a PCN reaction causing severe rash involving mucus membranes or skin necrosis: No Has patient had a PCN reaction that required hospitalization No Has patient had a PCN reaction occurring within the last 10 years: No If all of the above answers are "NO", then may proceed with Cephalosporin use.     Current Outpatient Medications  Medication Sig Dispense Refill  . acetaminophen (TYLENOL) 500 MG  tablet Take 1,000 mg by mouth every 6 (six) hours as needed for mild pain.     Marland Kitchen albuterol (PROVENTIL) (2.5 MG/3ML) 0.083% nebulizer solution Take 3 mLs (2.5 mg total) by nebulization every 6 (six) hours as needed for wheezing or shortness of breath. ICD 10:J45.20 75 mL 12  . allopurinol (ZYLOPRIM) 100 MG tablet Take 1 tablet (100 mg total) by mouth 2 (two) times daily. 60 tablet 6  . amLODipine (NORVASC) 10 MG tablet TAKE 1 TABLET BY MOUTH EVERYDAY AT BEDTIME 30 tablet 2  . atorvastatin (LIPITOR) 80 MG tablet Take 1 tablet (80 mg total) by mouth daily. 30 tablet 6  . budesonide-formoterol (SYMBICORT) 160-4.5 MCG/ACT inhaler Inhale 2 puffs into the lungs 2 (two) times daily for 1 day. 1 Inhaler 6  . carvedilol (COREG) 6.25 MG tablet TAKE 1 TABLET (6.25 MG TOTAL) BY MOUTH 2 (TWO) TIMES DAILY WITH A MEAL. 60 tablet 2  . chlorpheniramine (CHLOR-TRIMETON) 4 MG tablet Take 1 tablet (4 mg total) by mouth 3 (three) times daily. (Patient taking differently: Take 4 mg by mouth every 4 (four) hours as needed for allergies. ) 190 tablet 0  .  clotrimazole (LOTRIMIN) 1 % cream Apply 1 application topically 2 (two) times daily. 30 g 1  . cyclobenzaprine (FLEXERIL) 10 MG tablet Take 1 tablet (10 mg total) by mouth 2 (two) times daily as needed. For back pain (Patient taking differently: Take 10 mg by mouth 3 (three) times daily as needed (for back pain). ) 60 tablet 1  . famotidine (PEPCID) 20 MG tablet TAKE 1 TABLET BY MOUTH TWICE A DAY 180 tablet 0  . fluticasone (FLONASE) 50 MCG/ACT nasal spray SPRAY 2 SPRAYS INTO EACH NOSTRIL EVERY DAY (Patient taking differently: Place 2 sprays into both nostrils daily. ) 16 g 2  . gabapentin (NEURONTIN) 300 MG capsule TAKE 1 CAPSULE BY MOUTH TWICE A DAY 180 capsule 1  . meclizine (ANTIVERT) 25 MG tablet Take 25 mg by mouth 3 (three) times daily as needed for dizziness.    . multivitamin (RENA-VIT) TABS tablet Take 1 tablet by mouth daily.    . pantoprazole (PROTONIX) 40 MG tablet TAKE 1 TABLET BY MOUTH TWICE A DAY 180 tablet 0  . RENVELA 800 MG tablet Take 800-2,400 mg by mouth See admin instructions. Take 3 tablets (2400 mg) by mouth with each meal & take 1 tablet (800 mg) by mouth with each snack  11  . triamcinolone cream (KENALOG) 0.1 % Apply 1 application topically 2 (two) times daily. Applied to eczematous lesions (Patient taking differently: Apply 1 application topically 2 (two) times daily as needed (for eczema). ) 80 g 1  . VENTOLIN HFA 108 (90 Base) MCG/ACT inhaler INHALE 2 PUFFS BY MOUTH EVERY 6 HOURS AS NEEDED FOR WHEEZE OR SHORTNESS OF BREATH 18 Inhaler 1   No current facility-administered medications for this visit.      REVIEW OF SYSTEMS: see HPI for pertinent positives and negatives    PHYSICAL EXAMINATION:  Vitals:   08/25/18 1059  BP: (!) 179/94  Pulse: 84  Resp: 16  Temp: 97.9 F (36.6 C)  TempSrc: Oral  SpO2: 95%  Weight: 166 lb 4.8 oz (75.4 kg)  Height: 5' 2.4" (1.585 m)   Body mass index is 30.03 kg/m.  General: The patient appears her stated age, obese.    HEENT:  No gross abnormalities Pulmonary: Respirations are non-labored, CTAB, good air movement in all fields.  Abdomen: Soft and non-tender with normal bowel  sounds. Musculoskeletal: There are no major deformities.   Neurologic: No focal weakness or paresthesias are detected. Bilateral hand grip strength is 5/5.  Skin: There are no ulcer or rashes noted. Psychiatric: The patient has normal affect. Cardiovascular: There is a regular rate and rhythm without significant murmur appreciated. Radial pulses: right is 2+, left is 1+ palpable. Left upper arm AVF with palpable thrill and audible bruit. Right upper arm non functioning AVF has no thrill or bruit, does have a palpable pulse at the tender lump (2cmx3cm)  Non-Invasive Vascular Imaging  Right upper arm non functioning Access Duplex  (Date: 08/25/2018):  Findings: +--------------------+----------+-----------------+--------+ AVF                 PSV (cm/s)Flow Vol (mL/min)Comments +--------------------+----------+-----------------+--------+ Native artery inflow    85           60                 +--------------------+----------+-----------------+--------+ AVF Anastomosis         62                              +--------------------+----------+-----------------+--------+   +------------+----------+-------------+----------+--------+ OUTFLOW VEINPSV (cm/s)Diameter (cm)Depth (cm)Describe +------------+----------+-------------+----------+--------+ Dist UA                   1.41        0.48            +------------+----------+-------------+----------+--------+ The remaining segment of distal upper arm basilic vein is thrombosed with maximum diameter of 1.7 cm by 1.9 cm. There is a small, opening in the thrombus extending 1.22 cm from the brachial artery where to and from flow continues to circulate. The remaining thrombosed basilic vein segment appears to be 2.6 cm long.  Summary: Thrombosed residual segment of  basilic vein seen in distal right upper arm. There is a small open segment with to and fro flow with in.    Medical Decision Making  Rachael George is a 63 y.o. female who has a tender small pulsatile mass adjacent to her right upper arm non functioning AVF. 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  Her left upper arm AVF is functioning well.   Dr. Oneida Alar spoke with and examined pt. Will schedule for ligation of pseudoaneurysm right upper arm non functioning AVF on 09-06-18 with Dr. Oneida Alar.   Clemon Chambers, RN, MSN, FNP-C [facility] Office: [phone]  08/25/2018, 11:17 AM  Clinic MD: Laqueta Due

## 2018-08-25 NOTE — H&P (View-Only) (Signed)
CC: painful lump next to right arm non functioning AVF  History of Present Illness  1. Rachael George is a 63 y.o. ([birth year]) female who had a fistulagram of left upper arm AVF on 05-12-18 by Dr. Carlis Abbott for malfunction of AVF. Interventions performed: Central venoplasty - left innominate vein (10 mm x 40 mm Mustang and 12 mm x 40 mm Mustang) Peripheral venoplasty - venous outflow tract of left upper arm graft (8 mm x 40 mm Mustang).  She dialyzes MWF via left upper arm AVF. She is on the transplant list through [facility].   She had a previous right upper arm AVF that became infected 2 years ago, was cleaned out and ligated.  Pt returns today with c/o 4 month hx lump adjacent to her right upper arm non functioning AVF, became tender 2 months ago. She denies fever or chills. She states the lump has increased in size.   She denies steal type symptoms in either upper extremity.    Past Medical History:  Diagnosis Date  . Anemia   . Asthma   . ESRD (end stage renal disease) ([place])    MWF  . GERD (gastroesophageal reflux disease)   . Gout   . 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 Heavy menstrual bleeding   . High cholesterol   . History of blood transfusion 02/2016- last one   1990's- several ones   . History of kidney stones 1980's  . Hyperlipemia 12/07/2012  . Hypertension   . Hypothyroidism   . Kidney stones   . Pre-diabetes   . Prediabetes   . RA (rheumatoid arthritis) ([place])   . Sleep apnea    [place]- have CPAP, not able to use every night     Social History Social History   Tobacco Use  . Smoking status: Never Smoker  . Smokeless tobacco: Never Used  Substance Use Topics  . Alcohol use: Yes    Alcohol/week: 0.0 standard drinks    Comment: occ - 1 drink rarely  . Drug use: No    Family History Family History  Problem Relation Age of Onset  . Hypertension Mother   . Breast cancer Maternal Aunt   . Deep vein thrombosis  Daughter   . Hyperlipidemia Daughter   . Hypertension Daughter   . Prostate cancer Maternal Grandmother     Surgical History Past Surgical History:  Procedure Laterality Date  . A/V SHUNTOGRAM N/A 09/08/2016   Procedure: A/V Shuntogram - Left Arm AV Graft;  Surgeon: Serafina Mitchell, MD;  Location: [facility];  Service: Cardiovascular;  Laterality: N/A;  . A/V SHUNTOGRAM N/A 09/02/2017   Procedure: A/V SHUNTOGRAM - Left Arm AVG;  Surgeon: Conrad Bellaire, MD;  Location: [facility];  Service: Cardiovascular;  Laterality: N/A;  . A/V SHUNTOGRAM N/A 12/21/2017   Procedure: A/V SHUNTOGRAM - left arm;  Surgeon: Serafina Mitchell, MD;  Location: [facility];  Service: Cardiovascular;  Laterality: N/A;  . ABDOMINAL HYSTERECTOMY  2000  . AV FISTULA PLACEMENT Left 05/25/2016   Procedure: INSERTION OF LEFT UPPER ARM ARTERIOVENOUS (AV) LOOP GORE-TEX GRAFT ARM;  Surgeon: Elam Dutch, MD;  Location: [facility];  Service: Vascular;  Laterality: Left;  . BASCILIC VEIN TRANSPOSITION Right 12/06/2014   Procedure: FIRST STAGE BASILIC VEIN TRANSPOSITION - RIGHT;  Surgeon: Serafina Mitchell, MD;  Location: [facility];  Service: Vascular;  Laterality: Right;  .  BASCILIC VEIN TRANSPOSITION Right 02/07/2015   Procedure: RIGHT ARM SECOND STAGE BASILIC VEIN TRANSPOSITION;  Surgeon: Serafina Mitchell, MD;  Location: [facility];  Service: Vascular;  Laterality: Right;  . BASCILIC VEIN TRANSPOSITION Left 04/22/2016   Procedure: FIRST STAGE BASILIC VEIN TRANSPOSITION LEFT ARM;  Surgeon: Conrad Bloomingdale, MD;  Location: [facility];  Service: Vascular;  Laterality: Left;  . COLONOSCOPY W/ POLYPECTOMY    . ECTOPIC PREGNANCY SURGERY  02/1982  . EXCHANGE OF A DIALYSIS CATHETER Right 04/22/2016   Procedure: EXCHANGE OF A DIALYSIS CATHETER - INSERTION RIGHT INTERNAL JUGULAR & REMOVAL FROM LEFT INTERNAL JUGULAR;  Surgeon: Conrad Redkey, MD;  Location: [facility];  Service: Vascular;  Laterality: Right;  . I&D EXTREMITY Right 02/07/2015    Procedure: IRRIGATION AND DEBRIDEMENT RIGHT ARM HEMATOMA;  Surgeon: Serafina Mitchell, MD;  Location: [facility];  Service: Vascular;  Laterality: Right;  . INSERTION OF DIALYSIS CATHETER  03/03/2016   Procedure: INSERTION OF DIALYSIS CATHETER;  Surgeon: Elam Dutch, MD;  Location: [facility];  Service: Vascular;;  . IR AV DIALY SHUNT INTRO NEEDLE/INTRACATH INITIAL W/PTA/IMG LEFT  04/20/2017  . IR AV DIALY SHUNT INTRO NEEDLE/INTRACATH INITIAL W/PTA/IMG LEFT  06/24/2017  . IR THROMBECTOMY AV FISTULA W/THROMBOLYSIS/PTA INC/SHUNT/IMG LEFT Left 01/04/2017  . IR US GUIDE VASC ACCESS LEFT  01/04/2017  . left shoulder surgery  08/2005  . LIGATION OF ARTERIOVENOUS  FISTULA  03/03/2016   Procedure: LIGATION OF ARTERIOVENOUS  FISTULA;  Surgeon: Elam Dutch, MD;  Location: [facility];  Service: Vascular;;  . PERIPHERAL VASCULAR BALLOON ANGIOPLASTY  09/08/2016   Procedure: Peripheral Vascular Balloon Angioplasty;  Surgeon: Serafina Mitchell, MD;  Location: [facility];  Service: Cardiovascular;;  left avf  . PERIPHERAL VASCULAR BALLOON ANGIOPLASTY  09/02/2017   Procedure: PERIPHERAL VASCULAR BALLOON ANGIOPLASTY;  Surgeon: Conrad Bon Homme, MD;  Location: [facility];  Service: Cardiovascular;;  left AV Graft  . PERIPHERAL VASCULAR BALLOON ANGIOPLASTY  12/21/2017   Procedure: PERIPHERAL VASCULAR BALLOON ANGIOPLASTY;  Surgeon: Serafina Mitchell, MD;  Location: [facility];  Service: Cardiovascular;;  INOMINATE / UPPER ARM AV GRAFT  . PERIPHERAL VASCULAR BALLOON ANGIOPLASTY Left 05/12/2018   Procedure: PERIPHERAL VASCULAR BALLOON ANGIOPLASTY;  Surgeon: Marty Heck, MD;  Location: [facility];  Service: Cardiovascular;  Laterality: Left;  Arm fistula  . TEE WITHOUT CARDIOVERSION N/A 03/06/2016   Procedure: TRANSESOPHAGEAL ECHOCARDIOGRAM (TEE);  Surgeon: Sueanne Margarita, MD;  Location: [facility];  Service: Cardiovascular;  Laterality: N/A;  . TUBAL LIGATION  11/1986    Allergies  Allergen  Reactions  . Atacand [Candesartan] Anaphylaxis and Other (See Comments)    Swelling of the mouth and tongue.  Marland Kitchen Lisinopril Anaphylaxis and Other (See Comments)    Swelling of the mouth and tongue.  . Nsaids Anaphylaxis and Other (See Comments)    Swelling of the mouth and tongue.  Marland Kitchen Penicillins Rash and Other (See Comments)    Has patient had a PCN reaction causing immediate rash, facial/tongue/throat swelling, SOB or lightheadedness with hypotension: No Has patient had a PCN reaction causing severe rash involving mucus membranes or skin necrosis: No Has patient had a PCN reaction that required hospitalization No Has patient had a PCN reaction occurring within the last 10 years: No If all of the above answers are "NO", then may proceed with Cephalosporin use.     Current Outpatient Medications  Medication Sig Dispense Refill  . acetaminophen (TYLENOL) 500 MG  tablet Take 1,000 mg by mouth every 6 (six) hours as needed for mild pain.     Marland Kitchen albuterol (PROVENTIL) (2.5 MG/3ML) 0.083% nebulizer solution Take 3 mLs (2.5 mg total) by nebulization every 6 (six) hours as needed for wheezing or shortness of breath. ICD 10:J45.20 75 mL 12  . allopurinol (ZYLOPRIM) 100 MG tablet Take 1 tablet (100 mg total) by mouth 2 (two) times daily. 60 tablet 6  . amLODipine (NORVASC) 10 MG tablet TAKE 1 TABLET BY MOUTH EVERYDAY AT BEDTIME 30 tablet 2  . atorvastatin (LIPITOR) 80 MG tablet Take 1 tablet (80 mg total) by mouth daily. 30 tablet 6  . budesonide-formoterol (SYMBICORT) 160-4.5 MCG/ACT inhaler Inhale 2 puffs into the lungs 2 (two) times daily for 1 day. 1 Inhaler 6  . carvedilol (COREG) 6.25 MG tablet TAKE 1 TABLET (6.25 MG TOTAL) BY MOUTH 2 (TWO) TIMES DAILY WITH A MEAL. 60 tablet 2  . chlorpheniramine (CHLOR-TRIMETON) 4 MG tablet Take 1 tablet (4 mg total) by mouth 3 (three) times daily. (Patient taking differently: Take 4 mg by mouth every 4 (four) hours as needed for allergies. ) 190 tablet 0  .  clotrimazole (LOTRIMIN) 1 % cream Apply 1 application topically 2 (two) times daily. 30 g 1  . cyclobenzaprine (FLEXERIL) 10 MG tablet Take 1 tablet (10 mg total) by mouth 2 (two) times daily as needed. For back pain (Patient taking differently: Take 10 mg by mouth 3 (three) times daily as needed (for back pain). ) 60 tablet 1  . famotidine (PEPCID) 20 MG tablet TAKE 1 TABLET BY MOUTH TWICE A DAY 180 tablet 0  . fluticasone (FLONASE) 50 MCG/ACT nasal spray SPRAY 2 SPRAYS INTO EACH NOSTRIL EVERY DAY (Patient taking differently: Place 2 sprays into both nostrils daily. ) 16 g 2  . gabapentin (NEURONTIN) 300 MG capsule TAKE 1 CAPSULE BY MOUTH TWICE A DAY 180 capsule 1  . meclizine (ANTIVERT) 25 MG tablet Take 25 mg by mouth 3 (three) times daily as needed for dizziness.    . multivitamin (RENA-VIT) TABS tablet Take 1 tablet by mouth daily.    . pantoprazole (PROTONIX) 40 MG tablet TAKE 1 TABLET BY MOUTH TWICE A DAY 180 tablet 0  . RENVELA 800 MG tablet Take 800-2,400 mg by mouth See admin instructions. Take 3 tablets (2400 mg) by mouth with each meal & take 1 tablet (800 mg) by mouth with each snack  11  . triamcinolone cream (KENALOG) 0.1 % Apply 1 application topically 2 (two) times daily. Applied to eczematous lesions (Patient taking differently: Apply 1 application topically 2 (two) times daily as needed (for eczema). ) 80 g 1  . VENTOLIN HFA 108 (90 Base) MCG/ACT inhaler INHALE 2 PUFFS BY MOUTH EVERY 6 HOURS AS NEEDED FOR WHEEZE OR SHORTNESS OF BREATH 18 Inhaler 1   No current facility-administered medications for this visit.      REVIEW OF SYSTEMS: see HPI for pertinent positives and negatives    PHYSICAL EXAMINATION:  Vitals:   08/25/18 1059  BP: (!) 179/94  Pulse: 84  Resp: 16  Temp: 97.9 F (36.6 C)  TempSrc: Oral  SpO2: 95%  Weight: 166 lb 4.8 oz (75.4 kg)  Height: 5' 2.4" (1.585 m)   Body mass index is 30.03 kg/m.  General: The patient appears her stated age, obese.    HEENT:  No gross abnormalities Pulmonary: Respirations are non-labored, CTAB, good air movement in all fields.  Abdomen: Soft and non-tender with normal bowel  sounds. Musculoskeletal: There are no major deformities.   Neurologic: No focal weakness or paresthesias are detected. Bilateral hand grip strength is 5/5.  Skin: There are no ulcer or rashes noted. Psychiatric: The patient has normal affect. Cardiovascular: There is a regular rate and rhythm without significant murmur appreciated. Radial pulses: right is 2+, left is 1+ palpable. Left upper arm AVF with palpable thrill and audible bruit. Right upper arm non functioning AVF has no thrill or bruit, does have a palpable pulse at the tender lump (2cmx3cm)  Non-Invasive Vascular Imaging  Right upper arm non functioning Access Duplex  (Date: 08/25/2018):  Findings: +--------------------+----------+-----------------+--------+ AVF                 PSV (cm/s)Flow Vol (mL/min)Comments +--------------------+----------+-----------------+--------+ Native artery inflow    85           60                 +--------------------+----------+-----------------+--------+ AVF Anastomosis         62                              +--------------------+----------+-----------------+--------+   +------------+----------+-------------+----------+--------+ OUTFLOW VEINPSV (cm/s)Diameter (cm)Depth (cm)Describe +------------+----------+-------------+----------+--------+ Dist UA                   1.41        0.48            +------------+----------+-------------+----------+--------+ The remaining segment of distal upper arm basilic vein is thrombosed with maximum diameter of 1.7 cm by 1.9 cm. There is a small, opening in the thrombus extending 1.22 cm from the brachial artery where to and from flow continues to circulate. The remaining thrombosed basilic vein segment appears to be 2.6 cm long.  Summary: Thrombosed residual segment of  basilic vein seen in distal right upper arm. There is a small open segment with to and fro flow with in.    Medical Decision Making  ELLIEMAE BRAMAN is a 63 y.o. female who has a tender small pulsatile mass adjacent to her right upper arm non functioning AVF. 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  Her left upper arm AVF is functioning well.   Dr. Oneida Alar spoke with and examined pt. Will schedule for ligation of pseudoaneurysm right upper arm non functioning AVF on 09-06-18 with Dr. Oneida Alar.   Clemon Chambers, RN, MSN, FNP-C [facility] Office: [phone]  08/25/2018, 11:17 AM  Clinic MD: Laqueta Due

## 2018-08-26 DIAGNOSIS — N2581 Secondary hyperparathyroidism of renal origin: Secondary | ICD-10-CM | POA: Diagnosis not present

## 2018-08-26 DIAGNOSIS — D631 Anemia in chronic kidney disease: Secondary | ICD-10-CM | POA: Diagnosis not present

## 2018-08-26 DIAGNOSIS — N186 End stage renal disease: Secondary | ICD-10-CM | POA: Diagnosis not present

## 2018-08-26 DIAGNOSIS — E1129 Type 2 diabetes mellitus with other diabetic kidney complication: Secondary | ICD-10-CM | POA: Diagnosis not present

## 2018-08-26 DIAGNOSIS — D509 Iron deficiency anemia, unspecified: Secondary | ICD-10-CM | POA: Diagnosis not present

## 2018-08-29 DIAGNOSIS — E1129 Type 2 diabetes mellitus with other diabetic kidney complication: Secondary | ICD-10-CM | POA: Diagnosis not present

## 2018-08-29 DIAGNOSIS — D509 Iron deficiency anemia, unspecified: Secondary | ICD-10-CM | POA: Diagnosis not present

## 2018-08-29 DIAGNOSIS — D631 Anemia in chronic kidney disease: Secondary | ICD-10-CM | POA: Diagnosis not present

## 2018-08-29 DIAGNOSIS — N186 End stage renal disease: Secondary | ICD-10-CM | POA: Diagnosis not present

## 2018-08-29 DIAGNOSIS — N2581 Secondary hyperparathyroidism of renal origin: Secondary | ICD-10-CM | POA: Diagnosis not present

## 2018-08-30 ENCOUNTER — Other Ambulatory Visit: Payer: Self-pay

## 2018-08-30 ENCOUNTER — Ambulatory Visit: Payer: Medicare Other | Attending: Family Medicine | Admitting: Family Medicine

## 2018-08-30 DIAGNOSIS — K2100 Gastro-esophageal reflux disease with esophagitis, without bleeding: Secondary | ICD-10-CM

## 2018-08-30 DIAGNOSIS — I120 Hypertensive chronic kidney disease with stage 5 chronic kidney disease or end stage renal disease: Secondary | ICD-10-CM | POA: Diagnosis not present

## 2018-08-30 DIAGNOSIS — M1A09X0 Idiopathic chronic gout, multiple sites, without tophus (tophi): Secondary | ICD-10-CM

## 2018-08-30 DIAGNOSIS — E1169 Type 2 diabetes mellitus with other specified complication: Secondary | ICD-10-CM

## 2018-08-30 DIAGNOSIS — I10 Essential (primary) hypertension: Secondary | ICD-10-CM

## 2018-08-30 DIAGNOSIS — M545 Low back pain: Secondary | ICD-10-CM

## 2018-08-30 DIAGNOSIS — J4520 Mild intermittent asthma, uncomplicated: Secondary | ICD-10-CM | POA: Diagnosis not present

## 2018-08-30 DIAGNOSIS — G8929 Other chronic pain: Secondary | ICD-10-CM | POA: Diagnosis not present

## 2018-08-30 DIAGNOSIS — N186 End stage renal disease: Secondary | ICD-10-CM | POA: Diagnosis not present

## 2018-08-30 DIAGNOSIS — K219 Gastro-esophageal reflux disease without esophagitis: Secondary | ICD-10-CM | POA: Insufficient documentation

## 2018-08-30 MED ORDER — CYCLOBENZAPRINE HCL 10 MG PO TABS: 10.0000 mg | ORAL_TABLET | Freq: Two times a day (BID) | ORAL | 2 refills | Status: DC | PRN

## 2018-08-30 MED ORDER — BUDESONIDE-FORMOTEROL FUMARATE 160-4.5 MCG/ACT IN AERO: 2.0000 | INHALATION_SPRAY | Freq: Two times a day (BID) | RESPIRATORY_TRACT | 6 refills | Status: DC

## 2018-08-30 MED ORDER — FAMOTIDINE 20 MG PO TABS: 20.0000 mg | ORAL_TABLET | Freq: Two times a day (BID) | ORAL | 1 refills | Status: DC

## 2018-08-30 MED ORDER — ATORVASTATIN CALCIUM 80 MG PO TABS: 80.0000 mg | ORAL_TABLET | Freq: Every day | ORAL | 6 refills | Status: DC

## 2018-08-30 MED ORDER — AMLODIPINE BESYLATE 10 MG PO TABS: ORAL_TABLET | ORAL | 6 refills | Status: DC

## 2018-08-30 MED ORDER — PANTOPRAZOLE SODIUM 40 MG PO TBEC: 40.0000 mg | DELAYED_RELEASE_TABLET | Freq: Every day | ORAL | 1 refills | Status: DC

## 2018-08-30 MED ORDER — CARVEDILOL 6.25 MG PO TABS: 6.2500 mg | ORAL_TABLET | Freq: Two times a day (BID) | ORAL | 6 refills | Status: DC

## 2018-08-30 NOTE — Patient Instructions (Signed)
Acute Back Pain, Adult  Acute back pain is sudden and usually short-lived. It is often caused by an injury to the muscles and tissues in the back. The injury may result from:   A muscle or ligament getting overstretched or torn (strained). Ligaments are tissues that connect bones to each other. Lifting something improperly can cause a back strain.   Wear and tear (degeneration) of the spinal disks. Spinal disks are circular tissue that provides cushioning between the bones of the spine (vertebrae).   Twisting motions, such as while playing sports or doing yard work.   A hit to the back.   Arthritis.  You may have a physical exam, lab tests, and imaging tests to find the cause of your pain. Acute back pain usually goes away with rest and home care.  Follow these instructions at home:  Managing pain, stiffness, and swelling   Take over-the-counter and prescription medicines only as told by your health care provider.   Your health care provider may recommend applying ice during the first 24-48 hours after your pain starts. To do this:  ? Put ice in a plastic bag.  ? Place a towel between your skin and the bag.  ? Leave the ice on for 20 minutes, 2-3 times a day.   If directed, apply heat to the affected area as often as told by your health care provider. Use the heat source that your health care provider recommends, such as a moist heat pack or a heating pad.  ? Place a towel between your skin and the heat source.  ? Leave the heat on for 20-30 minutes.  ? Remove the heat if your skin turns bright red. This is especially important if you are unable to feel pain, heat, or cold. You have a greater risk of getting burned.  Activity     Do not stay in bed. Staying in bed for more than 1-2 days can delay your recovery.   Sit up and stand up straight. Avoid leaning forward when you sit, or hunching over when you stand.  ? If you work at a desk, sit close to it so you do not need to lean over. Keep your chin tucked  in. Keep your neck drawn back, and keep your elbows bent at a right angle. Your arms should look like the letter "L."  ? Sit high and close to the steering wheel when you drive. Add lower back (lumbar) support to your car seat, if needed.   Take short walks on even surfaces as soon as you are able. Try to increase the length of time you walk each day.   Do not sit, drive, or stand in one place for more than 30 minutes at a time. Sitting or standing for long periods of time can put stress on your back.   Do not drive or use heavy machinery while taking prescription pain medicine.   Use proper lifting techniques. When you bend and lift, use positions that put less stress on your back:  ? Bend your knees.  ? Keep the load close to your body.  ? Avoid twisting.   Exercise regularly as told by your health care provider. Exercising helps your back heal faster and helps prevent back injuries by keeping muscles strong and flexible.   Work with a physical therapist to make a safe exercise program, as recommended by your health care provider. Do any exercises as told by your physical therapist.  Lifestyle   Maintain   a healthy weight. Extra weight puts stress on your back and makes it difficult to have good posture.   Avoid activities or situations that make you feel anxious or stressed. Stress and anxiety increase muscle tension and can make back pain worse. Learn ways to manage anxiety and stress, such as through exercise.  General instructions   Sleep on a firm mattress in a comfortable position. Try lying on your side with your knees slightly bent. If you lie on your back, put a pillow under your knees.   Follow your treatment plan as told by your health care provider. This may include:  ? Cognitive or behavioral therapy.  ? Acupuncture or massage therapy.  ? Meditation or yoga.  Contact a health care provider if:   You have pain that is not relieved with rest or medicine.   You have increasing pain going down  into your legs or buttocks.   Your pain does not improve after 2 weeks.   You have pain at night.   You lose weight without trying.   You have a fever or chills.  Get help right away if:   You develop new bowel or bladder control problems.   You have unusual weakness or numbness in your arms or legs.   You develop nausea or vomiting.   You develop abdominal pain.   You feel faint.  Summary   Acute back pain is sudden and usually short-lived.   Use proper lifting techniques. When you bend and lift, use positions that put less stress on your back.   Take over-the-counter and prescription medicines and apply heat or ice as directed by your health care provider.  This information is not intended to replace advice given to you by your health care provider. Make sure you discuss any questions you have with your health care provider.  Document Released: 03/09/2005 Document Revised: 10/14/2017 Document Reviewed: 10/21/2016  Elsevier Interactive Patient Education  2019 [employer]

## 2018-08-30 NOTE — Progress Notes (Signed)
Patient has been called and DOB has been verified. Patient has been screened and transferred to PCP to start phone visit.     

## 2018-08-30 NOTE — Progress Notes (Signed)
Virtual Visit via Telephone Note  I connected with [name], on 08/30/2018 at 10:11 AM by telephone due to the COVID-19 pandemic and verified that I am speaking with the correct person using two identifiers.   Consent: I discussed the limitations, risks, security and privacy concerns of performing an evaluation and management service by telephone and the availability of in person appointments. I also discussed with the patient that there may be a patient responsible charge related to this service. The patient expressed understanding and agreed to proceed.   Location of Patient: Home  Location of Provider: Clinic   Persons participating in Telemedicine visit: Britni Driscoll Farrington-CMA Dr. Felecia Shelling     History of Present Illness: Rachael George is a  63 yr old female with a past medical history of hypertension, type 2 diabetes mellutlits , ESRD on dialysis (Monday, Wed & Friday) and gout who presents today for follow-up visit.  Her reflux symptoms are controlled on Protonix once a day and famotidine twice daily and symptoms recur if she abstains from taking these medications in this manner. Her gout has been stable with no recent flares and she is rarely having to use allopurinol. With regards to her diabetes mellitus this is diet controlled and she denies hypoglycemic episodes.  Denies blurry vision and has an upcoming appointment with her ophthalmologist [name] later this month.  With regards to her end-stage renal disease, hemodialysis sessions are going well and she is currently on the transplant list for [facility] and [facility].  She is also scheduled for right arm ligation of AV fistula next week. She has no additional concerns today.    Past Medical History:  Diagnosis Date  . Anemia   . Asthma   . ESRD (end stage renal disease) ([place])    MWF  . GERD (gastroesophageal reflux disease)   . Gout   . Heart murmur    born with it-  nothing to worry about  . Heavy menstrual bleeding   . High cholesterol   . History of blood transfusion 02/2016- last one   1990's- several ones   . History of kidney stones 1980's  . Hyperlipemia 12/07/2012  . Hypertension   . Hypothyroidism   . Kidney stones   . Pre-diabetes   . Prediabetes   . RA (rheumatoid arthritis) ([place])   . Sleep apnea    [place]- have CPAP, not able to use every night    Allergies  Allergen Reactions  . Atacand [Candesartan] Anaphylaxis and Other (See Comments)    Swelling of the mouth and tongue.  Marland Kitchen Lisinopril Anaphylaxis and Other (See Comments)    Swelling of the mouth and tongue.  . Nsaids Anaphylaxis and Other (See Comments)    Swelling of the mouth and tongue.  Marland Kitchen Penicillins Rash and Other (See Comments)    Has patient had a PCN reaction causing immediate rash, facial/tongue/throat swelling, SOB or lightheadedness with hypotension: No Has patient had a PCN reaction causing severe rash involving mucus membranes or skin necrosis: No Has patient had a PCN reaction that required hospitalization No Has patient had a PCN reaction occurring within the last 10 years: No If all of the above answers are "NO", then may proceed with Cephalosporin use.     Current Outpatient Medications on File Prior to Visit  Medication Sig Dispense Refill  . acetaminophen (TYLENOL) 500 MG tablet Take 1,000 mg by mouth every 6 (six) hours as needed for mild  pain.     . albuterol (PROVENTIL) (2.5 MG/3ML) 0.083% nebulizer solution Take 3 mLs (2.5 mg total) by nebulization every 6 (six) hours as needed for wheezing or shortness of breath. ICD 10:J45.20 75 mL 12  . allopurinol (ZYLOPRIM) 100 MG tablet Take 1 tablet (100 mg total) by mouth 2 (two) times daily. 60 tablet 6  . amLODipine (NORVASC) 10 MG tablet TAKE 1 TABLET BY MOUTH EVERYDAY AT BEDTIME 30 tablet 2  . atorvastatin (LIPITOR) 80 MG tablet Take 1 tablet (80 mg total) by mouth daily. 30 tablet 6  . carvedilol  (COREG) 6.25 MG tablet TAKE 1 TABLET (6.25 MG TOTAL) BY MOUTH 2 (TWO) TIMES DAILY WITH A MEAL. 60 tablet 2  . chlorpheniramine (CHLOR-TRIMETON) 4 MG tablet Take 1 tablet (4 mg total) by mouth 3 (three) times daily. (Patient taking differently: Take 4 mg by mouth every 4 (four) hours as needed for allergies. ) 190 tablet 0  . clotrimazole (LOTRIMIN) 1 % cream Apply 1 application topically 2 (two) times daily. 30 g 1  . cyclobenzaprine (FLEXERIL) 10 MG tablet Take 1 tablet (10 mg total) by mouth 2 (two) times daily as needed. For back pain (Patient taking differently: Take 10 mg by mouth 3 (three) times daily as needed (for back pain). ) 60 tablet 1  . famotidine (PEPCID) 20 MG tablet TAKE 1 TABLET BY MOUTH TWICE A DAY 180 tablet 0  . fluticasone (FLONASE) 50 MCG/ACT nasal spray SPRAY 2 SPRAYS INTO EACH NOSTRIL EVERY DAY (Patient taking differently: Place 2 sprays into both nostrils daily. ) 16 g 2  . gabapentin (NEURONTIN) 300 MG capsule TAKE 1 CAPSULE BY MOUTH TWICE A DAY 180 capsule 1  . meclizine (ANTIVERT) 25 MG tablet Take 25 mg by mouth 3 (three) times daily as needed for dizziness.    . multivitamin (RENA-VIT) TABS tablet Take 1 tablet by mouth daily.    . pantoprazole (PROTONIX) 40 MG tablet TAKE 1 TABLET BY MOUTH TWICE A DAY 180 tablet 0  . RENVELA 800 MG tablet Take 800-2,400 mg by mouth See admin instructions. Take 3 tablets (2400 mg) by mouth with each meal & take 1 tablet (800 mg) by mouth with each snack  11  . triamcinolone cream (KENALOG) 0.1 % Apply 1 application topically 2 (two) times daily. Applied to eczematous lesions (Patient taking differently: Apply 1 application topically 2 (two) times daily as needed (for eczema). ) 80 g 1  . VENTOLIN HFA 108 (90 Base) MCG/ACT inhaler INHALE 2 PUFFS BY MOUTH EVERY 6 HOURS AS NEEDED FOR WHEEZE OR SHORTNESS OF BREATH 18 Inhaler 1  . budesonide-formoterol (SYMBICORT) 160-4.5 MCG/ACT inhaler Inhale 2 puffs into the lungs 2 (two) times daily for 1  day. 1 Inhaler 6   No current facility-administered medications on file prior to visit.     Observations/Objective: Awake, alert, oriented x3 Not in acute distress  Lab Results  Component Value Date   HGBA1C 5.4 05/24/2018    Assessment and Plan: 1. Essential hypertension, benign Stable Counseled on blood pressure goal of less than 130/80, low-sodium, DASH diet, medication compliance, 150 minutes of moderate intensity exercise per week. Discussed medication compliance, adverse effects. - amLODipine (NORVASC) 10 MG tablet; TAKE 1 TABLET BY MOUTH EVERYDAY AT BEDTIME  Dispense: 30 tablet; Refill: 6 - carvedilol (COREG) 6.25 MG tablet; Take 1 tablet (6.25 mg total) by mouth 2 (two) times daily with a meal.  Dispense: 60 tablet; Refill: 6  2. Chronic right-sided low back pain  without sciatica Due to prolonged sitting on dialysis chair Advised to use lumbar pillow Refill Flexeril which she will use with Tylenol - cyclobenzaprine (FLEXERIL) 10 MG tablet; Take 1 tablet (10 mg total) by mouth 2 (two) times daily as needed. For back pain  Dispense: 60 tablet; Refill: 2  3. Idiopathic chronic gout of multiple sites without tophus No recent flares  4. End stage renal disease ([place]) Continue hemodialysis as per protocol She is currently on [facility] list for [facility] and [facility]  5. Gastroesophageal reflux disease with esophagitis Stable - famotidine (PEPCID) 20 MG tablet; Take 1 tablet (20 mg total) by mouth 2 (two) times daily.  Dispense: 180 tablet; Refill: 1 - pantoprazole (PROTONIX) 40 MG tablet; Take 1 tablet (40 mg total) by mouth daily.  Dispense: 90 tablet; Refill: 1  6. Type 2 diabetes mellitus with other specified complication, without long-term current use of insulin (HCC) Controlled with A1c of 5.4 Counseled on Diabetic diet, my plate method, 100 minutes of moderate intensity exercise/week Keep blood sugar logs with fasting goals of 80-120 mg/dl, random of less than 180 and in  the event of sugars less than 60 mg/dl or greater than 400 mg/dl please notify the clinic ASAP. It is recommended that you undergo annual eye exams and annual foot exams. Pneumonia vaccine is recommended. - atorvastatin (LIPITOR) 80 MG tablet; Take 1 tablet (80 mg total) by mouth daily.  Dispense: 30 tablet; Refill: 6  7. Mild intermittent asthma, unspecified whether complicated No recent flare - budesonide-formoterol (SYMBICORT) 160-4.5 MCG/ACT inhaler; Inhale 2 puffs into the lungs 2 (two) times daily for 1 day.  Dispense: 1 Inhaler; Refill: 6  Healthcare maintenance - she is due for mammogram which comes up at [facility] in 2 months.  Follow Up Instructions: Return in about 3 months (around 11/30/2018) for Medical conditions.    I discussed the assessment and treatment plan with the patient. The patient was provided an opportunity to ask questions and all were answered. The patient agreed with the plan and demonstrated an understanding of the instructions.   The patient was advised to call back or seek an in-person evaluation if the symptoms worsen or if the condition fails to improve as anticipated.     I provided 17 minutes total of non-face-to-face time during this encounter including median intraservice time, reviewing previous notes, labs, imaging, medications, management and patient verbalized understanding.     Charlott Rakes, MD, FAAFP. [facility] [place] [employer], [place]   08/30/2018, 10:11 AM

## 2018-08-31 DIAGNOSIS — D509 Iron deficiency anemia, unspecified: Secondary | ICD-10-CM | POA: Diagnosis not present

## 2018-08-31 DIAGNOSIS — E1129 Type 2 diabetes mellitus with other diabetic kidney complication: Secondary | ICD-10-CM | POA: Diagnosis not present

## 2018-08-31 DIAGNOSIS — D631 Anemia in chronic kidney disease: Secondary | ICD-10-CM | POA: Diagnosis not present

## 2018-08-31 DIAGNOSIS — N2581 Secondary hyperparathyroidism of renal origin: Secondary | ICD-10-CM | POA: Diagnosis not present

## 2018-08-31 DIAGNOSIS — N186 End stage renal disease: Secondary | ICD-10-CM | POA: Diagnosis not present

## 2018-09-01 DIAGNOSIS — E1122 Type 2 diabetes mellitus with diabetic chronic kidney disease: Secondary | ICD-10-CM | POA: Diagnosis not present

## 2018-09-01 DIAGNOSIS — I120 Hypertensive chronic kidney disease with stage 5 chronic kidney disease or end stage renal disease: Secondary | ICD-10-CM | POA: Diagnosis not present

## 2018-09-01 DIAGNOSIS — N186 End stage renal disease: Secondary | ICD-10-CM | POA: Diagnosis not present

## 2018-09-01 DIAGNOSIS — R079 Chest pain, unspecified: Secondary | ICD-10-CM | POA: Diagnosis not present

## 2018-09-01 DIAGNOSIS — Z01810 Encounter for preprocedural cardiovascular examination: Secondary | ICD-10-CM | POA: Diagnosis not present

## 2018-09-01 DIAGNOSIS — R0789 Other chest pain: Secondary | ICD-10-CM | POA: Diagnosis not present

## 2018-09-02 ENCOUNTER — Other Ambulatory Visit (HOSPITAL_COMMUNITY)
Admission: RE | Admit: 2018-09-02 | Discharge: 2018-09-02 | Disposition: A | Discharge status: Home / Self Care | Payer: Medicare Other | Source: Ambulatory Visit | Attending: Vascular Surgery | Admitting: Vascular Surgery

## 2018-09-02 DIAGNOSIS — N186 End stage renal disease: Secondary | ICD-10-CM | POA: Diagnosis not present

## 2018-09-02 DIAGNOSIS — E1129 Type 2 diabetes mellitus with other diabetic kidney complication: Secondary | ICD-10-CM | POA: Diagnosis not present

## 2018-09-02 DIAGNOSIS — N2581 Secondary hyperparathyroidism of renal origin: Secondary | ICD-10-CM | POA: Diagnosis not present

## 2018-09-02 DIAGNOSIS — Z1159 Encounter for screening for other viral diseases: Secondary | ICD-10-CM | POA: Diagnosis not present

## 2018-09-02 DIAGNOSIS — D509 Iron deficiency anemia, unspecified: Secondary | ICD-10-CM | POA: Diagnosis not present

## 2018-09-02 DIAGNOSIS — D631 Anemia in chronic kidney disease: Secondary | ICD-10-CM | POA: Diagnosis not present

## 2018-09-02 DIAGNOSIS — Z01812 Encounter for preprocedural laboratory examination: Secondary | ICD-10-CM | POA: Diagnosis not present

## 2018-09-03 LAB — NOVEL CORONAVIRUS, NAA (HOSP ORDER, SEND-OUT TO REF LAB; TAT 18-24 HRS): SARS-CoV-2, NAA: NOT DETECTED

## 2018-09-05 ENCOUNTER — Other Ambulatory Visit: Payer: Self-pay

## 2018-09-05 ENCOUNTER — Encounter (HOSPITAL_COMMUNITY): Payer: Self-pay | Admitting: *Deleted

## 2018-09-05 ENCOUNTER — Inpatient Hospital Stay (HOSPITAL_COMMUNITY): Admission: RE | Admit: 2018-09-05 | Payer: Medicare Other | Source: Ambulatory Visit

## 2018-09-05 NOTE — Progress Notes (Signed)
Mrs Calandra denies chest pain or shortness of breath.  Patient denies that she nor her family has experienced any of the following: Cough Fever >100.4 Runny Nose Sore Throat Difficulty breathing/ shortness of breath Travel in past 14 days - no  COVID TEST 09/02/2018- negative. Wears a mask to dialysis

## 2018-09-06 ENCOUNTER — Encounter (HOSPITAL_COMMUNITY): Payer: Self-pay | Admitting: *Deleted

## 2018-09-06 ENCOUNTER — Ambulatory Visit (HOSPITAL_COMMUNITY): Payer: Medicare Other | Admitting: Certified Registered Nurse Anesthetist

## 2018-09-06 ENCOUNTER — Encounter (HOSPITAL_COMMUNITY)
Admission: RE | Disposition: A | Discharge status: Home / Self Care | Payer: Self-pay | Source: Home / Self Care | Attending: Vascular Surgery

## 2018-09-06 ENCOUNTER — Ambulatory Visit (HOSPITAL_COMMUNITY)
Admission: RE | Admit: 2018-09-06 | Discharge: 2018-09-06 | Disposition: A | Discharge status: Home / Self Care | Payer: Medicare Other | Attending: Vascular Surgery | Admitting: Vascular Surgery

## 2018-09-06 DIAGNOSIS — M109 Gout, unspecified: Secondary | ICD-10-CM | POA: Insufficient documentation

## 2018-09-06 DIAGNOSIS — E039 Hypothyroidism, unspecified: Secondary | ICD-10-CM | POA: Diagnosis not present

## 2018-09-06 DIAGNOSIS — G4730 Sleep apnea, unspecified: Secondary | ICD-10-CM | POA: Diagnosis not present

## 2018-09-06 DIAGNOSIS — E785 Hyperlipidemia, unspecified: Secondary | ICD-10-CM | POA: Diagnosis not present

## 2018-09-06 DIAGNOSIS — T82898A Other specified complication of vascular prosthetic devices, implants and grafts, initial encounter: Secondary | ICD-10-CM | POA: Insufficient documentation

## 2018-09-06 DIAGNOSIS — J45909 Unspecified asthma, uncomplicated: Secondary | ICD-10-CM | POA: Diagnosis not present

## 2018-09-06 DIAGNOSIS — Z886 Allergy status to analgesic agent status: Secondary | ICD-10-CM | POA: Diagnosis not present

## 2018-09-06 DIAGNOSIS — N186 End stage renal disease: Secondary | ICD-10-CM | POA: Diagnosis not present

## 2018-09-06 DIAGNOSIS — Z8249 Family history of ischemic heart disease and other diseases of the circulatory system: Secondary | ICD-10-CM | POA: Diagnosis not present

## 2018-09-06 DIAGNOSIS — Z992 Dependence on renal dialysis: Secondary | ICD-10-CM | POA: Diagnosis not present

## 2018-09-06 DIAGNOSIS — Z888 Allergy status to other drugs, medicaments and biological substances status: Secondary | ICD-10-CM | POA: Diagnosis not present

## 2018-09-06 DIAGNOSIS — Z880 Allergy status to penicillin: Secondary | ICD-10-CM | POA: Insufficient documentation

## 2018-09-06 DIAGNOSIS — I120 Hypertensive chronic kidney disease with stage 5 chronic kidney disease or end stage renal disease: Secondary | ICD-10-CM | POA: Diagnosis not present

## 2018-09-06 DIAGNOSIS — Y832 Surgical operation with anastomosis, bypass or graft as the cause of abnormal reaction of the patient, or of later complication, without mention of misadventure at the time of the procedure: Secondary | ICD-10-CM | POA: Diagnosis not present

## 2018-09-06 DIAGNOSIS — I721 Aneurysm of artery of upper extremity: Secondary | ICD-10-CM | POA: Insufficient documentation

## 2018-09-06 DIAGNOSIS — Z87442 Personal history of urinary calculi: Secondary | ICD-10-CM | POA: Insufficient documentation

## 2018-09-06 DIAGNOSIS — Z79899 Other long term (current) drug therapy: Secondary | ICD-10-CM | POA: Insufficient documentation

## 2018-09-06 DIAGNOSIS — R7303 Prediabetes: Secondary | ICD-10-CM | POA: Diagnosis not present

## 2018-09-06 DIAGNOSIS — K219 Gastro-esophageal reflux disease without esophagitis: Secondary | ICD-10-CM | POA: Insufficient documentation

## 2018-09-06 DIAGNOSIS — E7800 Pure hypercholesterolemia, unspecified: Secondary | ICD-10-CM | POA: Insufficient documentation

## 2018-09-06 DIAGNOSIS — Z7951 Long term (current) use of inhaled steroids: Secondary | ICD-10-CM | POA: Insufficient documentation

## 2018-09-06 DIAGNOSIS — M069 Rheumatoid arthritis, unspecified: Secondary | ICD-10-CM | POA: Insufficient documentation

## 2018-09-06 HISTORY — PX: LIGATION OF ARTERIOVENOUS  FISTULA: SHX5948

## 2018-09-06 HISTORY — DX: Gastric ulcer, unspecified as acute or chronic, without hemorrhage or perforation: K25.9

## 2018-09-06 HISTORY — DX: Personal history of other diseases of the digestive system: Z87.19

## 2018-09-06 LAB — GLUCOSE, CAPILLARY: Glucose-Capillary: 92 mg/dL (ref 70–99)

## 2018-09-06 LAB — POCT I-STAT 4, (NA,K, GLUC, HGB,HCT)
Glucose, Bld: 88 mg/dL (ref 70–99)
HCT: 34 % — ABNORMAL LOW (ref 36.0–46.0)
Hemoglobin: 11.6 g/dL — ABNORMAL LOW (ref 12.0–15.0)
Potassium: 4.8 mmol/L (ref 3.5–5.1)
Sodium: 138 mmol/L (ref 135–145)

## 2018-09-06 SURGERY — LIGATION OF ARTERIOVENOUS  FISTULA
Anesthesia: Monitor Anesthesia Care | Site: Arm Upper | Laterality: Right

## 2018-09-06 MED ORDER — VANCOMYCIN HCL IN DEXTROSE 1-5 GM/200ML-% IV SOLN
1000.0000 mg | INTRAVENOUS | Status: AC
  Administered 2018-09-06: 1000 mg via INTRAVENOUS
  Filled 2018-09-06: qty 200

## 2018-09-06 MED ORDER — OXYCODONE-ACETAMINOPHEN 5-325 MG PO TABS
ORAL_TABLET | ORAL | Status: AC
  Filled 2018-09-06: qty 1

## 2018-09-06 MED ORDER — FENTANYL CITRATE (PF) 100 MCG/2ML IJ SOLN
INTRAMUSCULAR | Status: DC | PRN
  Administered 2018-09-06: 25 ug via INTRAVENOUS
  Administered 2018-09-06: 50 ug via INTRAVENOUS
  Administered 2018-09-06: 25 ug via INTRAVENOUS

## 2018-09-06 MED ORDER — OXYCODONE-ACETAMINOPHEN 5-325 MG PO TABS: 1.0000 | ORAL_TABLET | Freq: Four times a day (QID) | ORAL | 0 refills | Status: DC | PRN

## 2018-09-06 MED ORDER — LIDOCAINE HCL (PF) 1 % IJ SOLN
INTRAMUSCULAR | Status: AC
  Filled 2018-09-06: qty 30

## 2018-09-06 MED ORDER — FENTANYL CITRATE (PF) 100 MCG/2ML IJ SOLN: 25.0000 ug | INTRAMUSCULAR | Status: DC | PRN

## 2018-09-06 MED ORDER — PROPOFOL 10 MG/ML IV BOLUS
INTRAVENOUS | Status: AC
  Filled 2018-09-06: qty 20

## 2018-09-06 MED ORDER — PROPOFOL 10 MG/ML IV BOLUS
INTRAVENOUS | Status: DC | PRN
  Administered 2018-09-06 (×2): 20 mg via INTRAVENOUS

## 2018-09-06 MED ORDER — ONDANSETRON HCL 4 MG/2ML IJ SOLN: 4.0000 mg | Freq: Four times a day (QID) | INTRAMUSCULAR | Status: DC | PRN

## 2018-09-06 MED ORDER — OXYCODONE HCL 5 MG PO TABS: 5.0000 mg | ORAL_TABLET | Freq: Once | ORAL | Status: DC | PRN

## 2018-09-06 MED ORDER — PHENYLEPHRINE 40 MCG/ML (10ML) SYRINGE FOR IV PUSH (FOR BLOOD PRESSURE SUPPORT)
PREFILLED_SYRINGE | INTRAVENOUS | Status: AC
  Filled 2018-09-06: qty 10

## 2018-09-06 MED ORDER — ONDANSETRON HCL 4 MG/2ML IJ SOLN
INTRAMUSCULAR | Status: DC | PRN
  Administered 2018-09-06: 4 mg via INTRAVENOUS

## 2018-09-06 MED ORDER — SODIUM CHLORIDE 0.9 % IV SOLN
INTRAVENOUS | Status: DC | PRN
  Administered 2018-09-06: 25 ug/min via INTRAVENOUS

## 2018-09-06 MED ORDER — SODIUM CHLORIDE 0.9 % IV SOLN
INTRAVENOUS | Status: DC
  Administered 2018-09-06 (×2): via INTRAVENOUS

## 2018-09-06 MED ORDER — 0.9 % SODIUM CHLORIDE (POUR BTL) OPTIME
TOPICAL | Status: DC | PRN
  Administered 2018-09-06: 1000 mL

## 2018-09-06 MED ORDER — CHLORHEXIDINE GLUCONATE 4 % EX LIQD: 60.0000 mL | Freq: Once | CUTANEOUS | Status: DC

## 2018-09-06 MED ORDER — ONDANSETRON HCL 4 MG/2ML IJ SOLN
INTRAMUSCULAR | Status: AC
  Filled 2018-09-06: qty 2

## 2018-09-06 MED ORDER — OXYCODONE-ACETAMINOPHEN 5-325 MG PO TABS
1.0000 | ORAL_TABLET | Freq: Once | ORAL | Status: AC
  Administered 2018-09-06: 10:00:00 1 via ORAL

## 2018-09-06 MED ORDER — PROTAMINE SULFATE 10 MG/ML IV SOLN
INTRAVENOUS | Status: DC | PRN
  Administered 2018-09-06: 50 mg via INTRAVENOUS

## 2018-09-06 MED ORDER — LIDOCAINE HCL (PF) 1 % IJ SOLN
INTRAMUSCULAR | Status: DC | PRN
  Administered 2018-09-06: 30 mL

## 2018-09-06 MED ORDER — FENTANYL CITRATE (PF) 250 MCG/5ML IJ SOLN
INTRAMUSCULAR | Status: AC
  Filled 2018-09-06: qty 5

## 2018-09-06 MED ORDER — OXYCODONE HCL 5 MG/5ML PO SOLN: 5.0000 mg | Freq: Once | ORAL | Status: DC | PRN

## 2018-09-06 MED ORDER — PROPOFOL 500 MG/50ML IV EMUL
INTRAVENOUS | Status: DC | PRN
  Administered 2018-09-06: 100 ug/kg/min via INTRAVENOUS

## 2018-09-06 MED ORDER — HEPARIN SODIUM (PORCINE) 1000 UNIT/ML IJ SOLN
INTRAMUSCULAR | Status: DC | PRN
  Administered 2018-09-06: 7000 [IU] via INTRAVENOUS

## 2018-09-06 MED ORDER — SODIUM CHLORIDE 0.9 % IV SOLN
INTRAVENOUS | Status: DC | PRN
  Administered 2018-09-06: 500 mL

## 2018-09-06 MED ORDER — SODIUM CHLORIDE 0.9 % IV SOLN
INTRAVENOUS | Status: AC
  Filled 2018-09-06: qty 1.2

## 2018-09-06 SURGICAL SUPPLY — 36 items
BLADE CLIPPER SENSICLIP SURGIC (BLADE) ×1 IMPLANT
CANISTER SUCT 3000ML PPV (MISCELLANEOUS) ×2 IMPLANT
CANNULA VESSEL 3MM 2 BLNT TIP (CANNULA) ×1 IMPLANT
CLIP VESOCCLUDE MED 6/CT (CLIP) ×1 IMPLANT
CLIP VESOCCLUDE SM WIDE 6/CT (CLIP) ×1 IMPLANT
COVER WAND RF STERILE (DRAPES) ×1 IMPLANT
DERMABOND ADVANCED (GAUZE/BANDAGES/DRESSINGS) ×1
DERMABOND ADVANCED .7 DNX12 (GAUZE/BANDAGES/DRESSINGS) ×1 IMPLANT
ELECT REM PT RETURN 9FT ADLT (ELECTROSURGICAL) ×2
ELECTRODE REM PT RTRN 9FT ADLT (ELECTROSURGICAL) ×1 IMPLANT
GLOVE BIO SURGEON STRL SZ7.5 (GLOVE) ×2 IMPLANT
GLOVE BIOGEL PI IND STRL 6.5 (GLOVE) IMPLANT
GLOVE BIOGEL PI IND STRL 7.0 (GLOVE) IMPLANT
GLOVE BIOGEL PI INDICATOR 6.5 (GLOVE) ×1
GLOVE BIOGEL PI INDICATOR 7.0 (GLOVE) ×2
GLOVE SURG SS PI 6.5 STRL IVOR (GLOVE) ×1 IMPLANT
GOWN STRL NON-REIN LRG LVL3 (GOWN DISPOSABLE) ×1 IMPLANT
GOWN STRL REUS W/ TWL LRG LVL3 (GOWN DISPOSABLE) ×3 IMPLANT
GOWN STRL REUS W/TWL LRG LVL3 (GOWN DISPOSABLE) ×4
HEMOSTAT SPONGE AVITENE ULTRA (HEMOSTASIS) IMPLANT
KIT BASIN OR (CUSTOM PROCEDURE TRAY) ×2 IMPLANT
KIT TURNOVER KIT B (KITS) ×2 IMPLANT
LOOP VESSEL MINI RED (MISCELLANEOUS) IMPLANT
NS IRRIG 1000ML POUR BTL (IV SOLUTION) ×3 IMPLANT
PACK CV ACCESS (CUSTOM PROCEDURE TRAY) ×2 IMPLANT
PAD ARMBOARD 7.5X6 YLW CONV (MISCELLANEOUS) ×4 IMPLANT
PATCH VASC XENOSURE 1CMX6CM (Vascular Products) IMPLANT
PATCH VASC XENOSURE 1X6 (Vascular Products) IMPLANT
SUT PROLENE 6 0 CC (SUTURE) ×3 IMPLANT
SUT SILK 0 (SUTURE) IMPLANT
SUT VIC AB 3-0 SH 27 (SUTURE) ×2
SUT VIC AB 3-0 SH 27X BRD (SUTURE) ×1 IMPLANT
SUT VICRYL 4-0 PS2 18IN ABS (SUTURE) ×2 IMPLANT
TOWEL GREEN STERILE (TOWEL DISPOSABLE) ×2 IMPLANT
UNDERPAD 30X30 (UNDERPADS AND DIAPERS) ×2 IMPLANT
WATER STERILE IRR 1000ML POUR (IV SOLUTION) ×2 IMPLANT

## 2018-09-06 NOTE — Transfer of Care (Signed)
Immediate Anesthesia Transfer of Care Note  Patient: Rachael George  Procedure(s) Performed: Resection of pseudoaneursym and end to end repair of right brachial artery (Right Arm Upper)  Patient Location: PACU  Anesthesia Type:MAC  Level of Consciousness: awake, alert  and oriented  Airway & Oxygen Therapy: Patient Spontanous Breathing and Patient connected to face mask oxygen  Post-op Assessment: Report given to RN and Post -op Vital signs reviewed and stable  Post vital signs: Reviewed and stable  Last Vitals:  Vitals Value Taken Time  BP 160/83 09/06/18 0911  Temp    Pulse 80 09/06/18 0915  Resp 19 09/06/18 0915  SpO2 100 % 09/06/18 0915  Vitals shown include unvalidated device data.  Last Pain:  Vitals:   09/06/18 0911  TempSrc:   PainSc: (P) 0-No pain      Patients Stated Pain Goal: 6 (29/47/65 4650)  Complications: No apparent anesthesia complications

## 2018-09-06 NOTE — Anesthesia Postprocedure Evaluation (Signed)
Anesthesia Post Note  Patient: Rachael George  Procedure(s) Performed: Resection of pseudoaneursym and end to end repair of right brachial artery (Right Arm Upper)     Patient location during evaluation: PACU Anesthesia Type: MAC Level of consciousness: awake and alert Pain management: pain level controlled Vital Signs Assessment: post-procedure vital signs reviewed and stable Respiratory status: spontaneous breathing, nonlabored ventilation, respiratory function stable and patient connected to nasal cannula oxygen Cardiovascular status: stable and blood pressure returned to baseline Postop Assessment: no apparent nausea or vomiting Anesthetic complications: no    Last Vitals:  Vitals:   09/06/18 0953 09/06/18 1011  BP:  (!) 173/92  Pulse: 85 79  Resp: 16 16  Temp:  (!) 36.2 C  SpO2: 100% 100%    Last Pain:  Vitals:   09/06/18 1011  TempSrc:   PainSc: Kim

## 2018-09-06 NOTE — Anesthesia Procedure Notes (Signed)
Procedure Name: MAC Date/Time: 09/06/2018 7:32 AM Performed by: Inda Coke, CRNA Pre-anesthesia Checklist: Patient identified, Emergency Drugs available, Suction available, Timeout performed and Patient being monitored Patient Re-evaluated:Patient Re-evaluated prior to induction Oxygen Delivery Method: Simple face mask Induction Type: IV induction Dental Injury: Teeth and Oropharynx as per pre-operative assessment

## 2018-09-06 NOTE — Anesthesia Preprocedure Evaluation (Signed)
Anesthesia Evaluation  Patient identified by MRN, date of birth, ID band Patient awake    Reviewed: Allergy & Precautions, H&P , NPO status , Patient's Chart, lab work & pertinent test results  Airway Mallampati: II   Neck ROM: full    Dental   Pulmonary asthma , sleep apnea ,    breath sounds clear to auscultation       Cardiovascular hypertension,  Rhythm:regular Rate:Normal     Neuro/Psych    GI/Hepatic hiatal hernia, PUD, GERD  ,  Endo/Other  Hypothyroidism   Renal/GU ESRF and DialysisRenal diseasestones     Musculoskeletal  (+) Arthritis , Rheumatoid disorders,    Abdominal   Peds  Hematology   Anesthesia Other Findings   Reproductive/Obstetrics                             Anesthesia Physical Anesthesia Plan  ASA: III  Anesthesia Plan: MAC   Post-op Pain Management:    Induction: Intravenous  PONV Risk Score and Plan: 2 and Propofol infusion, Ondansetron and Treatment may vary due to age or medical condition  Airway Management Planned: Simple Face Mask  Additional Equipment:   Intra-op Plan:   Post-operative Plan:   Informed Consent: I have reviewed the patients History and Physical, chart, labs and discussed the procedure including the risks, benefits and alternatives for the proposed anesthesia with the patient or authorized representative who has indicated his/her understanding and acceptance.       Plan Discussed with: CRNA, Anesthesiologist and Surgeon  Anesthesia Plan Comments:         Anesthesia Quick Evaluation

## 2018-09-06 NOTE — Discharge Instructions (Signed)
° °  [facility]  Discharge Instructions  AV Fistula or Graft Surgery for Dialysis Access  Please refer to the following instructions for your post-procedure care. Your surgeon or physician assistant will discuss any changes with you.  Activity  You may drive the day following your surgery, if you are comfortable and no longer taking prescription pain medication. Resume full activity as the soreness in your incision resolves.  Bathing/Showering  You may shower after you go home. Keep your incision dry for 48 hours. Do not soak in a bathtub, hot tub, or swim until the incision heals completely. You may not shower if you have a hemodialysis catheter.  Incision Care  Clean your incision with mild soap and water after 48 hours. Pat the area dry with a clean towel. You do not need a bandage unless otherwise instructed. Do not apply any ointments or creams to your incision. You may have skin glue on your incision. Do not peel it off. It will come off on its own in about one week. Your arm may swell a bit after surgery. To reduce swelling use pillows to elevate your arm so it is above your heart. Your doctor will tell you if you need to lightly wrap your arm with an ACE bandage.  Diet  Resume your normal diet. There are not special food restrictions following this procedure. In order to heal from your surgery, it is CRITICAL to get adequate nutrition. Your body requires vitamins, minerals, and protein. Vegetables are the best source of vitamins and minerals. Vegetables also provide the perfect balance of protein. Processed food has little nutritional value, so try to avoid this.  Medications  Resume taking all of your medications. If your incision is causing pain, you may take over-the counter pain relievers such as acetaminophen (Tylenol). If you were prescribed a stronger pain medication, please be aware these medications can cause nausea and constipation. Prevent  nausea by taking the medication with a snack or meal. Avoid constipation by drinking plenty of fluids and eating foods with high amount of fiber, such as fruits, vegetables, and grains.  Do not take Tylenol if you are taking prescription pain medications.  Follow up Your surgeon may want to see you in the office following your access surgery. If so, this will be arranged at the time of your surgery.  Please call us immediately for any of the following conditions:  Increased pain, redness, drainage (pus) from your incision site Fever of 101 degrees or higher Severe or worsening pain at your incision site Hand pain or numbness.  Reduce your risk of vascular disease:  Stop smoking. If you would like help, call [facility] at 1-800-QUIT-NOW 717-125-7861) or [place] at [place] your cholesterol Maintain a desired weight Control your diabetes Keep your blood pressure down  Dialysis  It will take several weeks to several months for your new dialysis access to be ready for use. Your surgeon will determine when it is okay to use it. Your nephrologist will continue to direct your dialysis. You can continue to use your Permcath until your new access is ready for use.   09/06/2018 Rachael George [id number] Oct 31, 1955  Surgeon(s): Elam Dutch, MD  Procedure(s): Resection of pseudoaneursym and end to end repair of right brachial artery   If you have any questions, please call the office at [phone].

## 2018-09-06 NOTE — Progress Notes (Signed)
May stick foot for labs/bloodwork per Dr. Marin Comment

## 2018-09-06 NOTE — Op Note (Signed)
Procedure: Resection of right brachial artery pseudoaneurysm with end to end repair of right brachial artery  Preoperative diagnosis: Right brachial artery pseudoaneurysm  Postoperative diagnosis: Same  Anesthesia: Local with IV sedation  Assistant: Liana Crocker, PA-C  Operative findings: Aneurysmal degeneration of pre-existing AV fistula with resection of the aneurysm and primary anastomosis of the brachial artery  Operative details: After obtaining form consent, the patient taken the operating.  The patient placed supine position operating table.  After adequate sedation patient's entire right upper extremities prepped and draped in usual sterile fashion.  Local anesthesia was infiltrated over the mid portion of the upper arm medial aspect over the area of the aneurysm.  Incision was carried on through subcutaneous tissues down to level aneurysm.  It was pulsatile in nature.  Dissection proceeded along the edges of the aneurysm as close as I could get this as possible to avoid adjacent nerve structures.  There were 2 nerves adjacent and adherent to the aneurysm and neuro lysis of these was performed as carefully as possible.  The brachial artery was then dissected free proximal and distal to the aneurysm.  The brachial artery proximal distal to the aneurysm was of normal diameter about 4 mm.  The aneurysmal segment represented a portion of an AV fistula that have been previously placed.  Patient was given 7000 units of intravenous heparin.  The artery was clamped proximal and distal to the aneurysm.  The aneurysm was then transected.  There was enough redundancy in the brachial artery that I felt like to bring these 2 ends together without patching the artery.  Therefore the entire aneurysm was resected and the 2 ends of the brachial artery then were repaired with an end-to-end anastomosis using a running 6-0 Prolene suture.  Just prior to completion anastomosis it was for blood backbled and  thoroughly flushed anastomosis was secured clamps released there is pulsatile flow brachial artery and radial artery immediately.  He was this was obtained with administration of 50 mg of protamine as well as direct pressure.  Subcutaneous tissues were reapproximated using running 3-0 Vicryl suture.  Skin was closed with 4-0 Vicryl subcuticular stitch.  Dermabond was applied the skin.  The patient tolerated procedure well and there were no complications.  The incident sponge and needle counts correct in the case.  The patient was taken to recovery in stable condition.  Ruta Hinds, MD [facility] [place] Office: [phone] Pager: [phone]

## 2018-09-06 NOTE — Interval H&P Note (Signed)
History and Physical Interval Note:  09/06/2018 7:22 AM  [name]  has presented today for surgery, with the diagnosis of PSUEDOANEURYSM ARTERIOVENOUS FISTULA RIGHT ARM.  The various methods of treatment have been discussed with the patient and family. After consideration of risks, benefits and other options for treatment, the patient has consented to  Procedure(s): LIGATION OF ARTERIOVENOUS  FISTULA PSUEDOANEURYSM RIGHT ARM (Right) as a surgical intervention.  The patient's history has been reviewed, patient examined, no change in status, stable for surgery.  I have reviewed the patient's chart and labs.  Questions were answered to the patient's satisfaction.     Ruta Hinds

## 2018-09-07 ENCOUNTER — Encounter (HOSPITAL_COMMUNITY): Payer: Self-pay | Admitting: Vascular Surgery

## 2018-09-07 DIAGNOSIS — D509 Iron deficiency anemia, unspecified: Secondary | ICD-10-CM | POA: Diagnosis not present

## 2018-09-07 DIAGNOSIS — D631 Anemia in chronic kidney disease: Secondary | ICD-10-CM | POA: Diagnosis not present

## 2018-09-07 DIAGNOSIS — N2581 Secondary hyperparathyroidism of renal origin: Secondary | ICD-10-CM | POA: Diagnosis not present

## 2018-09-07 DIAGNOSIS — N186 End stage renal disease: Secondary | ICD-10-CM | POA: Diagnosis not present

## 2018-09-07 DIAGNOSIS — E1129 Type 2 diabetes mellitus with other diabetic kidney complication: Secondary | ICD-10-CM | POA: Diagnosis not present

## 2018-09-09 DIAGNOSIS — N2581 Secondary hyperparathyroidism of renal origin: Secondary | ICD-10-CM | POA: Diagnosis not present

## 2018-09-09 DIAGNOSIS — E1129 Type 2 diabetes mellitus with other diabetic kidney complication: Secondary | ICD-10-CM | POA: Diagnosis not present

## 2018-09-09 DIAGNOSIS — D509 Iron deficiency anemia, unspecified: Secondary | ICD-10-CM | POA: Diagnosis not present

## 2018-09-09 DIAGNOSIS — N186 End stage renal disease: Secondary | ICD-10-CM | POA: Diagnosis not present

## 2018-09-09 DIAGNOSIS — D631 Anemia in chronic kidney disease: Secondary | ICD-10-CM | POA: Diagnosis not present

## 2018-09-12 DIAGNOSIS — D631 Anemia in chronic kidney disease: Secondary | ICD-10-CM | POA: Diagnosis not present

## 2018-09-12 DIAGNOSIS — D509 Iron deficiency anemia, unspecified: Secondary | ICD-10-CM | POA: Diagnosis not present

## 2018-09-12 DIAGNOSIS — N186 End stage renal disease: Secondary | ICD-10-CM | POA: Diagnosis not present

## 2018-09-12 DIAGNOSIS — E1129 Type 2 diabetes mellitus with other diabetic kidney complication: Secondary | ICD-10-CM | POA: Diagnosis not present

## 2018-09-12 DIAGNOSIS — N2581 Secondary hyperparathyroidism of renal origin: Secondary | ICD-10-CM | POA: Diagnosis not present

## 2018-09-14 DIAGNOSIS — D631 Anemia in chronic kidney disease: Secondary | ICD-10-CM | POA: Diagnosis not present

## 2018-09-14 DIAGNOSIS — N2581 Secondary hyperparathyroidism of renal origin: Secondary | ICD-10-CM | POA: Diagnosis not present

## 2018-09-14 DIAGNOSIS — D509 Iron deficiency anemia, unspecified: Secondary | ICD-10-CM | POA: Diagnosis not present

## 2018-09-14 DIAGNOSIS — N186 End stage renal disease: Secondary | ICD-10-CM | POA: Diagnosis not present

## 2018-09-14 DIAGNOSIS — E1129 Type 2 diabetes mellitus with other diabetic kidney complication: Secondary | ICD-10-CM | POA: Diagnosis not present

## 2018-09-16 DIAGNOSIS — N2581 Secondary hyperparathyroidism of renal origin: Secondary | ICD-10-CM | POA: Diagnosis not present

## 2018-09-16 DIAGNOSIS — D631 Anemia in chronic kidney disease: Secondary | ICD-10-CM | POA: Diagnosis not present

## 2018-09-16 DIAGNOSIS — D509 Iron deficiency anemia, unspecified: Secondary | ICD-10-CM | POA: Diagnosis not present

## 2018-09-16 DIAGNOSIS — E1129 Type 2 diabetes mellitus with other diabetic kidney complication: Secondary | ICD-10-CM | POA: Diagnosis not present

## 2018-09-16 DIAGNOSIS — N186 End stage renal disease: Secondary | ICD-10-CM | POA: Diagnosis not present

## 2018-09-19 DIAGNOSIS — D631 Anemia in chronic kidney disease: Secondary | ICD-10-CM | POA: Diagnosis not present

## 2018-09-19 DIAGNOSIS — N2581 Secondary hyperparathyroidism of renal origin: Secondary | ICD-10-CM | POA: Diagnosis not present

## 2018-09-19 DIAGNOSIS — D509 Iron deficiency anemia, unspecified: Secondary | ICD-10-CM | POA: Diagnosis not present

## 2018-09-19 DIAGNOSIS — N186 End stage renal disease: Secondary | ICD-10-CM | POA: Diagnosis not present

## 2018-09-19 DIAGNOSIS — E1129 Type 2 diabetes mellitus with other diabetic kidney complication: Secondary | ICD-10-CM | POA: Diagnosis not present

## 2018-09-21 DIAGNOSIS — Z992 Dependence on renal dialysis: Secondary | ICD-10-CM | POA: Diagnosis not present

## 2018-09-21 DIAGNOSIS — N186 End stage renal disease: Secondary | ICD-10-CM | POA: Diagnosis not present

## 2018-09-21 DIAGNOSIS — D631 Anemia in chronic kidney disease: Secondary | ICD-10-CM | POA: Diagnosis not present

## 2018-09-21 DIAGNOSIS — D509 Iron deficiency anemia, unspecified: Secondary | ICD-10-CM | POA: Diagnosis not present

## 2018-09-21 DIAGNOSIS — E1129 Type 2 diabetes mellitus with other diabetic kidney complication: Secondary | ICD-10-CM | POA: Diagnosis not present

## 2018-09-21 DIAGNOSIS — I129 Hypertensive chronic kidney disease with stage 1 through stage 4 chronic kidney disease, or unspecified chronic kidney disease: Secondary | ICD-10-CM | POA: Diagnosis not present

## 2018-09-21 DIAGNOSIS — N2581 Secondary hyperparathyroidism of renal origin: Secondary | ICD-10-CM | POA: Diagnosis not present

## 2018-09-23 DIAGNOSIS — E1129 Type 2 diabetes mellitus with other diabetic kidney complication: Secondary | ICD-10-CM | POA: Diagnosis not present

## 2018-09-23 DIAGNOSIS — N186 End stage renal disease: Secondary | ICD-10-CM | POA: Diagnosis not present

## 2018-09-23 DIAGNOSIS — D509 Iron deficiency anemia, unspecified: Secondary | ICD-10-CM | POA: Diagnosis not present

## 2018-09-23 DIAGNOSIS — D631 Anemia in chronic kidney disease: Secondary | ICD-10-CM | POA: Diagnosis not present

## 2018-09-23 DIAGNOSIS — N2581 Secondary hyperparathyroidism of renal origin: Secondary | ICD-10-CM | POA: Diagnosis not present

## 2018-09-26 DIAGNOSIS — D631 Anemia in chronic kidney disease: Secondary | ICD-10-CM | POA: Diagnosis not present

## 2018-09-26 DIAGNOSIS — D509 Iron deficiency anemia, unspecified: Secondary | ICD-10-CM | POA: Diagnosis not present

## 2018-09-26 DIAGNOSIS — N186 End stage renal disease: Secondary | ICD-10-CM | POA: Diagnosis not present

## 2018-09-26 DIAGNOSIS — N2581 Secondary hyperparathyroidism of renal origin: Secondary | ICD-10-CM | POA: Diagnosis not present

## 2018-09-26 DIAGNOSIS — E1129 Type 2 diabetes mellitus with other diabetic kidney complication: Secondary | ICD-10-CM | POA: Diagnosis not present

## 2018-09-28 ENCOUNTER — Telehealth (HOSPITAL_COMMUNITY): Payer: Self-pay | Admitting: Rehabilitation

## 2018-09-28 DIAGNOSIS — D509 Iron deficiency anemia, unspecified: Secondary | ICD-10-CM | POA: Diagnosis not present

## 2018-09-28 DIAGNOSIS — D631 Anemia in chronic kidney disease: Secondary | ICD-10-CM | POA: Diagnosis not present

## 2018-09-28 DIAGNOSIS — E1129 Type 2 diabetes mellitus with other diabetic kidney complication: Secondary | ICD-10-CM | POA: Diagnosis not present

## 2018-09-28 DIAGNOSIS — N2581 Secondary hyperparathyroidism of renal origin: Secondary | ICD-10-CM | POA: Diagnosis not present

## 2018-09-28 DIAGNOSIS — N186 End stage renal disease: Secondary | ICD-10-CM | POA: Diagnosis not present

## 2018-09-28 IMAGING — XA IR AV DIALY SHUNT INTRO NEEDLE/INTRACATH INITIAL W/PTA/IMG*L*
1 series · 14 of 24 positions shown · IV contrast (IODINE)
Comparison: Declot - 01/04/2017

INDICATION: Poor flows at dialysis. Please perform diagnostic shuntogram and
potential intervention.

EXAM:
1. DIAGNOSTIC DIALYSIS GRAFT SHUNTOGRAM
2. FLUOROSCOPIC GUIDED VENOUS ANGIOPLASTY

[Series 300: dsa body · 14 of 36 slices shown]
[im 1/36]
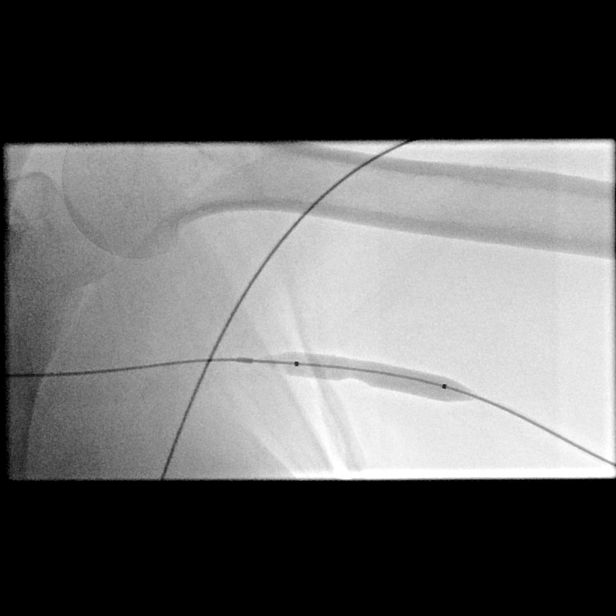
[im 4/36]
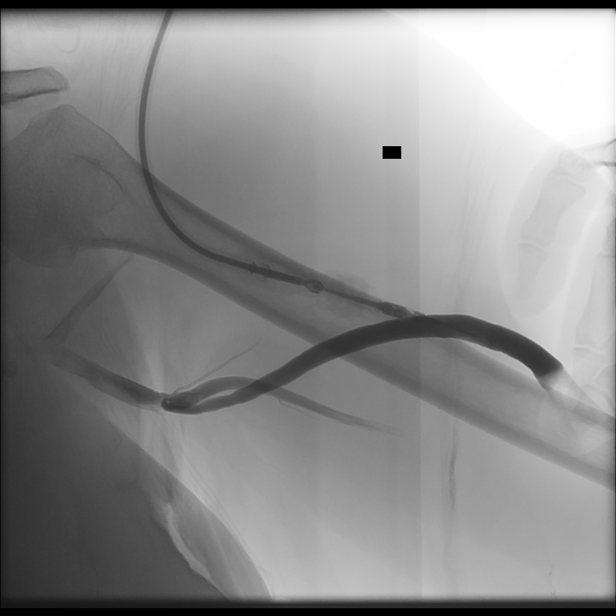
[im 7/36]
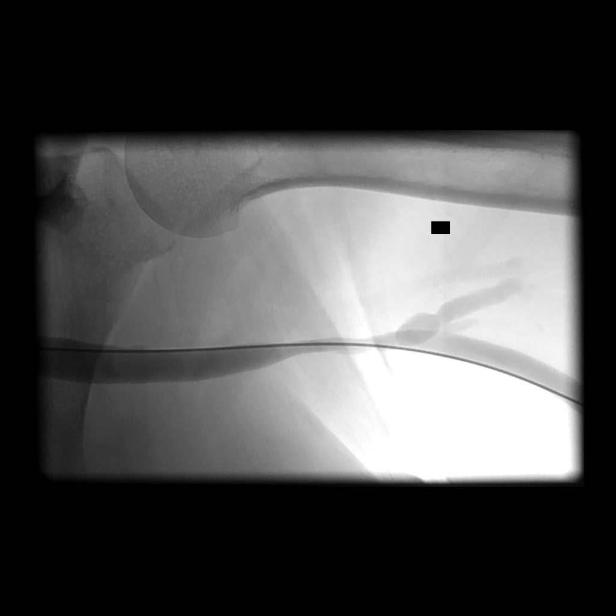
[im 10/36]
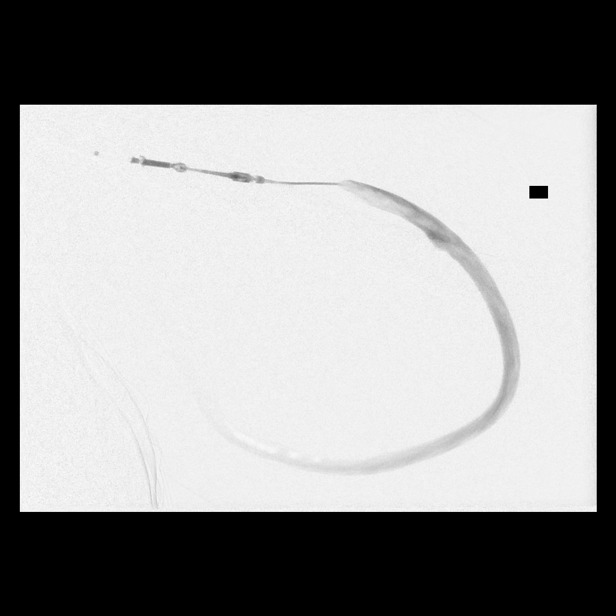
[im 11/36]
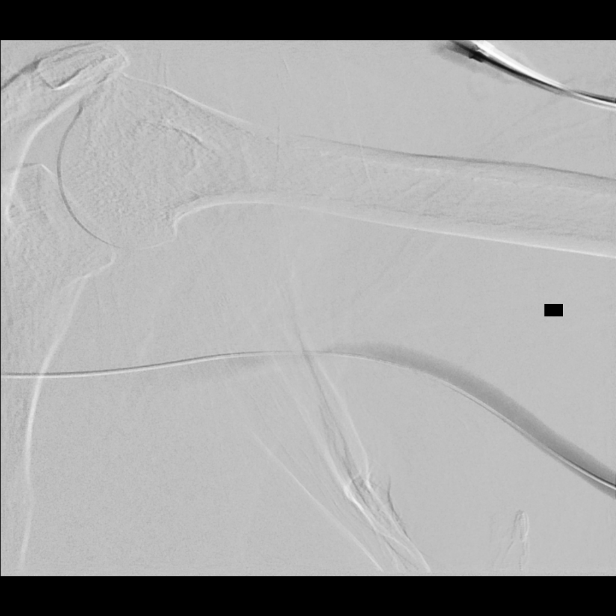
[im 14/36]
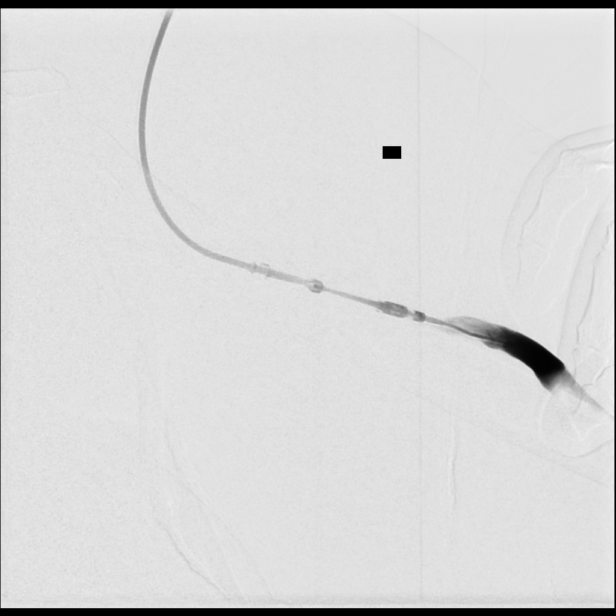
[im 17/36]
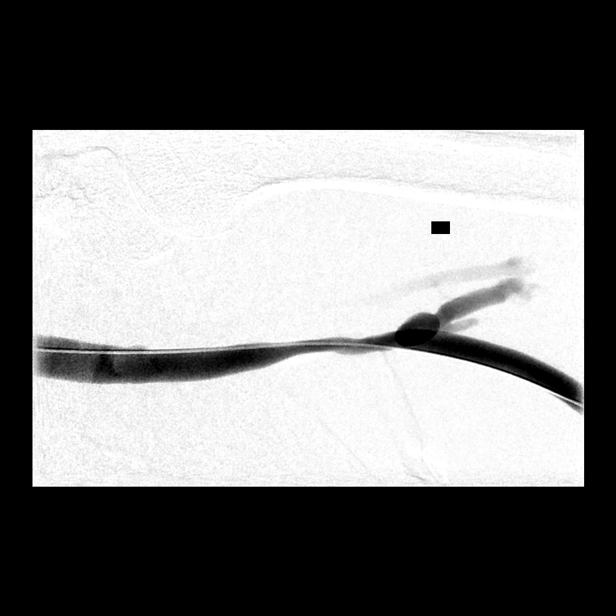
[im 19/36]
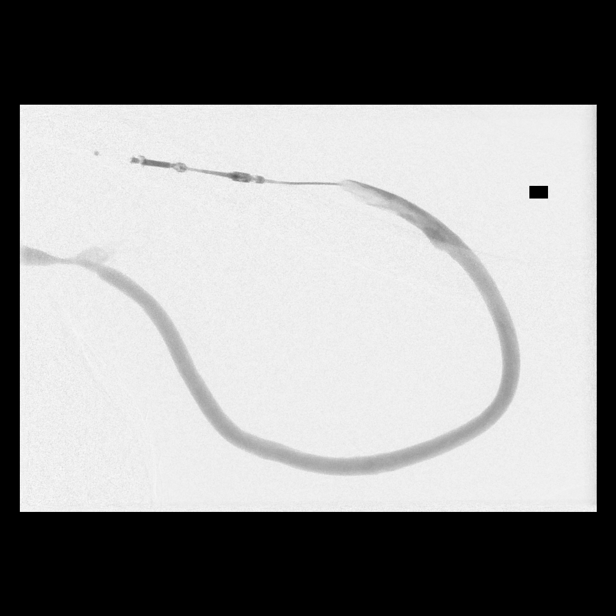
[im 22/36]
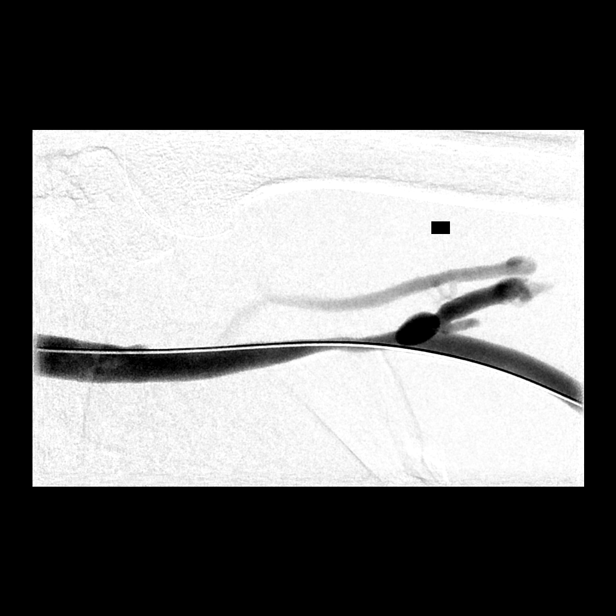
[im 25/36]
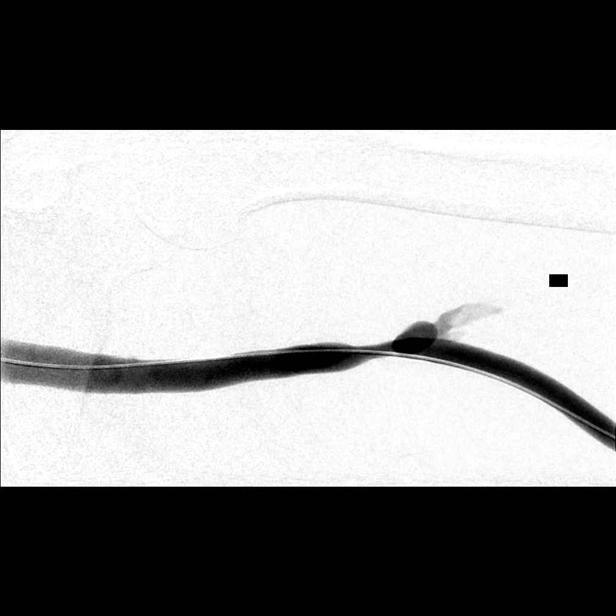
[im 28/36]
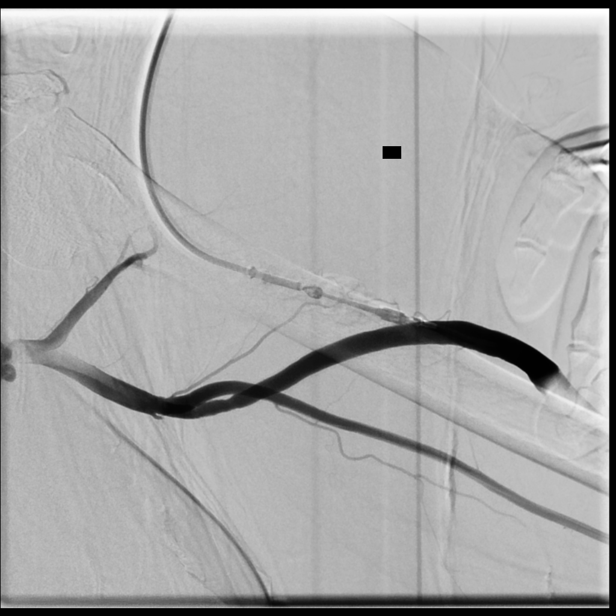
[im 29/36]
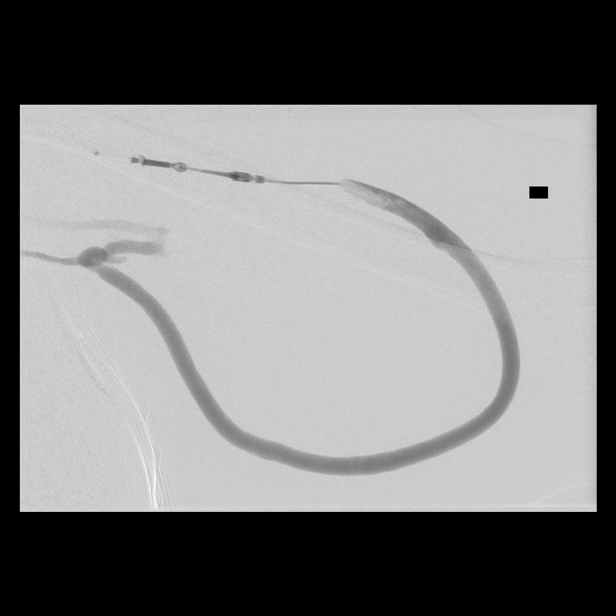
[im 32/36]
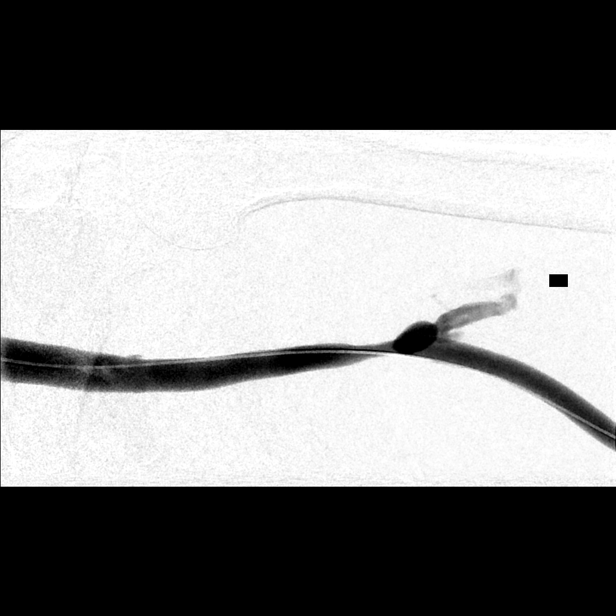
[im 36/36]
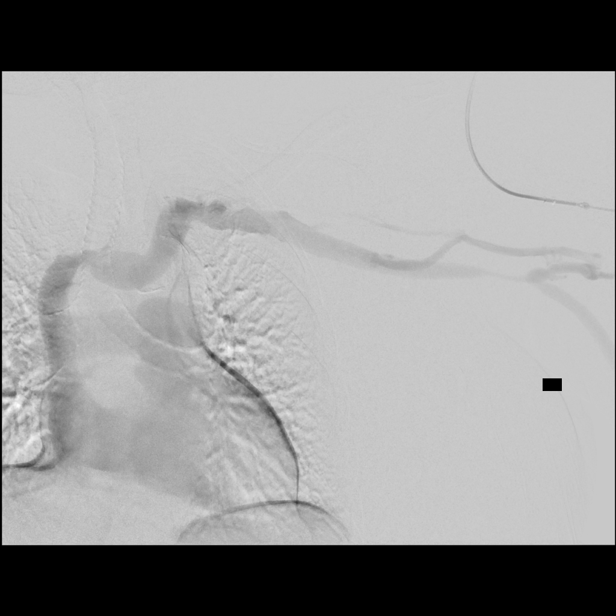

[14 of 24 positions shown; findings below may reference images not displayed]

MEDICATIONS:
None.

CONTRAST:  75 cc Isovue 300

ANESTHESIA/SEDATION:
None

FLUOROSCOPY TIME:  1 minute 36 seconds (34 mGy)

COMPLICATIONS:
None immediate.

PROCEDURE:
Informed written consent was obtained from the patient after a
discussion of the risk, benefits and alternatives to treatment.
Questions regarding the procedure were encouraged and answered. A
timeout was performed prior to the initiation of the procedure.

The skin overlying the left upper arm dialysis graft was prepped
with Betadine in a sterile fashion, and a sterile drape was applied
covering the operative field. A diagnostic shunt study was performed
via an 18 gauge angiocatheter introduced into venous outflow. Venous
drainage was assessed to the level of the central veins in the
chest. Proximal shunt was studied by reflux maneuver with temporary
compression of venous outflow.

The angiocath was removed and replaced with a 6-French sheath over a
guidewire. Several overlapping rounds of prolonged balloon
angioplasty of the venous anastomosis was then performed with a 7 mm
x 4 cm Conquest balloon. Completion shuntogram were performed.

The sheath was removed and hemostasis obtained with application of a
2-0 Ethilon pursestring suture which will be removed at the
patient's next dialysis session. A dressing was placed. The patient
tolerated the procedure well without immediate postprocedural
complication.
FINDINGS: The left upper arm brachial basilic AV graft is widely patent.

There is a recurrent focal moderate to severe (approximately 70%)
luminal narrowing of the venous anastomosis was treated with several
rounds of overlapping balloon angioplasty to 7 mm diameter.
Completion shuntogram demonstrates a mild (approximately 30%)
narrowing of the venous anastomosis, not resulting in
hemodynamically significant stenosis. No evidence of complication,
specifically, no evidence of contrast extravasation or vessel
dissection.

The remainder of the venous limb is widely patent without a
hemodynamically significant stenosis.

Reflux shuntogram demonstrates wide patency of the arterial limb and
anastomosis.

The central venous system of the left upper extremity is widely
patent to the level of the right atrium.
IMPRESSION: Successful balloon angioplasty of short segment moderate to severe
narrowing of the venous anastomosis to 7 mm diameter.

ACCESS:
This access remains amenable to future percutaneous interventions as
clinically indicated.

## 2018-09-28 NOTE — Telephone Encounter (Signed)

## 2018-09-29 ENCOUNTER — Encounter: Payer: Self-pay | Admitting: *Deleted

## 2018-09-29 ENCOUNTER — Other Ambulatory Visit: Payer: Self-pay

## 2018-09-29 ENCOUNTER — Ambulatory Visit (INDEPENDENT_AMBULATORY_CARE_PROVIDER_SITE_OTHER): Payer: Medicare Other | Admitting: Vascular Surgery

## 2018-09-29 ENCOUNTER — Other Ambulatory Visit: Payer: Self-pay | Admitting: *Deleted

## 2018-09-29 ENCOUNTER — Encounter: Payer: Self-pay | Admitting: Vascular Surgery

## 2018-09-29 VITALS — BP 193/100 | HR 83 | Temp 97.8°F | Resp 18 | Ht 62.0 in | Wt 166.0 lb

## 2018-09-29 DIAGNOSIS — M79622 Pain in left upper arm: Secondary | ICD-10-CM | POA: Diagnosis not present

## 2018-09-29 DIAGNOSIS — N186 End stage renal disease: Secondary | ICD-10-CM

## 2018-09-29 NOTE — Progress Notes (Signed)
Patient is a 63 year old female who returns for follow-up today.  She recently underwent resection of a pseudoaneurysm from a old nonfunctioning fistula in her right upper arm.  She had primary repair and resection of the pseudoaneurysm.  She reports that the pain in her upper arm has completely resolved.  She has no incisional drainage.  She has no redness around the incision.   She has a new problem with pain in her left axillary region.  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  This is been angioplastied on multiple prior occasions.  This is usually done with a 12 mm balloon.  The access is functioning but they do have some decreased access flow.  She has no numbness or tingling in her left hand.  Her dialysis today is Monday Wednesday Friday.  She is considering switching to peritoneal dialysis.  Review of systems: She denies fever or chills.  She denies shortness of breath.  She denies chest pain.  Past Medical History:  Diagnosis Date  . Anemia   . Asthma   . ESRD (end stage renal disease) ([place])    MWF  . GERD (gastroesophageal reflux disease)   . Gout   . 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 Heavy menstrual bleeding   . High cholesterol   . History of blood transfusion 02/2016- last one   1990's- several ones   . History of hiatal hernia   . History of kidney stones 1980's  . Hyperlipemia 12/07/2012  . Hypertension   . Hypothyroidism   . Multiple gastric ulcers   . Pneumonia 2012  . Pre-diabetes   . Prediabetes   . RA (rheumatoid arthritis) ([place])   . Sleep apnea    [place]- have CPAP, not able to use every night     Past Surgical History:  Procedure Laterality Date  . A/V SHUNTOGRAM N/A 09/08/2016   Procedure: A/V Shuntogram - Left Arm AV Graft;  Surgeon: Serafina Mitchell, MD;  Location: [facility];  Service: Cardiovascular;  Laterality: N/A;  . A/V SHUNTOGRAM N/A 09/02/2017    Procedure: A/V SHUNTOGRAM - Left Arm AVG;  Surgeon: Conrad Commerce, MD;  Location: [facility];  Service: Cardiovascular;  Laterality: N/A;  . A/V SHUNTOGRAM N/A 12/21/2017   Procedure: A/V SHUNTOGRAM - left arm;  Surgeon: Serafina Mitchell, MD;  Location: [facility];  Service: Cardiovascular;  Laterality: N/A;  . ABDOMINAL HYSTERECTOMY  2000  . AV FISTULA PLACEMENT Left 05/25/2016   Procedure: INSERTION OF LEFT UPPER ARM ARTERIOVENOUS (AV) LOOP GORE-TEX GRAFT ARM;  Surgeon: Elam Dutch, MD;  Location: [facility];  Service: Vascular;  Laterality: Left;  . BASCILIC VEIN TRANSPOSITION Right 12/06/2014   Procedure: FIRST STAGE BASILIC VEIN TRANSPOSITION - RIGHT;  Surgeon: Serafina Mitchell, MD;  Location: [facility];  Service: Vascular;  Laterality: Right;  . BASCILIC VEIN TRANSPOSITION Right 02/07/2015   Procedure: RIGHT ARM SECOND STAGE BASILIC VEIN TRANSPOSITION;  Surgeon: Serafina Mitchell, MD;  Location: [address];  Service: Vascular;  Laterality: Right;  . BASCILIC VEIN TRANSPOSITION Left 04/22/2016   Procedure: FIRST STAGE BASILIC VEIN TRANSPOSITION LEFT ARM;  Surgeon: Conrad , MD;  Location: [facility];  Service: Vascular;  Laterality: Left;  . COLONOSCOPY W/ POLYPECTOMY    . ECTOPIC PREGNANCY SURGERY  02/1982  . EXCHANGE OF A DIALYSIS CATHETER Right 04/22/2016  Procedure: EXCHANGE OF A DIALYSIS CATHETER - INSERTION RIGHT INTERNAL JUGULAR & REMOVAL FROM LEFT INTERNAL JUGULAR;  Surgeon: Conrad [place], MD;  Location: [facility];  Service: Vascular;  Laterality: Right;  . I&D EXTREMITY Right 02/07/2015   Procedure: IRRIGATION AND DEBRIDEMENT RIGHT ARM HEMATOMA;  Surgeon: Serafina Mitchell, MD;  Location: [facility];  Service: Vascular;  Laterality: Right;  . INSERTION OF DIALYSIS CATHETER  03/03/2016   Procedure: INSERTION OF DIALYSIS CATHETER;  Surgeon: Elam Dutch, MD;  Location: [facility];  Service: Vascular;;  . IR AV DIALY SHUNT INTRO NEEDLE/INTRACATH INITIAL W/PTA/IMG LEFT  04/20/2017  . IR AV DIALY  SHUNT INTRO NEEDLE/INTRACATH INITIAL W/PTA/IMG LEFT  06/24/2017  . IR THROMBECTOMY AV FISTULA W/THROMBOLYSIS/PTA INC/SHUNT/IMG LEFT Left 01/04/2017  . IR US GUIDE VASC ACCESS LEFT  01/04/2017  . left shoulder surgery  08/2005  . LIGATION OF ARTERIOVENOUS  FISTULA  03/03/2016   Procedure: LIGATION OF ARTERIOVENOUS  FISTULA;  Surgeon: Elam Dutch, MD;  Location: [facility];  Service: Vascular;;  . LIGATION OF ARTERIOVENOUS  FISTULA Right 09/06/2018   Procedure: Resection of pseudoaneursym and end to end repair of right brachial artery;  Surgeon: Elam Dutch, MD;  Location: [facility];  Service: Vascular;  Laterality: Right;  . PERIPHERAL VASCULAR BALLOON ANGIOPLASTY  09/08/2016   Procedure: Peripheral Vascular Balloon Angioplasty;  Surgeon: Serafina Mitchell, MD;  Location: [facility];  Service: Cardiovascular;;  left avf  . PERIPHERAL VASCULAR BALLOON ANGIOPLASTY  09/02/2017   Procedure: PERIPHERAL VASCULAR BALLOON ANGIOPLASTY;  Surgeon: Conrad [place], MD;  Location: [facility];  Service: Cardiovascular;;  left AV Graft  . PERIPHERAL VASCULAR BALLOON ANGIOPLASTY  12/21/2017   Procedure: PERIPHERAL VASCULAR BALLOON ANGIOPLASTY;  Surgeon: Serafina Mitchell, MD;  Location: [facility];  Service: Cardiovascular;;  INOMINATE / UPPER ARM AV GRAFT  . PERIPHERAL VASCULAR BALLOON ANGIOPLASTY Left 05/12/2018   Procedure: PERIPHERAL VASCULAR BALLOON ANGIOPLASTY;  Surgeon: Marty Heck, MD;  Location: [facility];  Service: Cardiovascular;  Laterality: Left;  Arm fistula  . TEE WITHOUT CARDIOVERSION N/A 03/06/2016   Procedure: TRANSESOPHAGEAL ECHOCARDIOGRAM (TEE);  Surgeon: Sueanne Margarita, MD;  Location: [facility];  Service: Cardiovascular;  Laterality: N/A;  . TUBAL LIGATION  11/1986    Current Outpatient Medications on File Prior to Visit  Medication Sig Dispense Refill  . acetaminophen (TYLENOL) 650 MG CR tablet Take 650-1,300 mg by mouth every 8 (eight) hours as needed  for pain.    Marland Kitchen albuterol (PROVENTIL) (2.5 MG/3ML) 0.083% nebulizer solution Take 3 mLs (2.5 mg total) by nebulization every 6 (six) hours as needed for wheezing or shortness of breath. ICD 10:J45.20 75 mL 12  . allopurinol (ZYLOPRIM) 100 MG tablet Take 1 tablet (100 mg total) by mouth 2 (two) times daily. 60 tablet 6  . amLODipine (NORVASC) 10 MG tablet TAKE 1 TABLET BY MOUTH EVERYDAY AT BEDTIME (Patient taking differently: Take 10 mg by mouth at bedtime. TAKE 1 TABLET BY MOUTH EVERYDAY AT BEDTIME) 30 tablet 6  . atorvastatin (LIPITOR) 80 MG tablet Take 1 tablet (80 mg total) by mouth daily. (Patient taking differently: Take 80 mg by mouth at bedtime. ) 30 tablet 6  . budesonide-formoterol (SYMBICORT) 160-4.5 MCG/ACT inhaler Inhale 2 puffs into the lungs 2 (two) times daily for 1 day. 1 Inhaler 6  . carvedilol (COREG) 25 MG tablet     . chlorpheniramine (CHLOR-TRIMETON) 4 MG tablet Take 1 tablet (4 mg total)  by mouth 3 (three) times daily. (Patient taking differently: Take 8 mg by mouth daily at 12 noon. ) 190 tablet 0  . clotrimazole (LOTRIMIN) 1 % cream Apply 1 application topically 2 (two) times daily. (Patient taking differently: Apply 1 application topically 2 (two) times daily as needed (skin irritation.). ) 30 g 1  . cyclobenzaprine (FLEXERIL) 10 MG tablet Take 1 tablet (10 mg total) by mouth 2 (two) times daily as needed. For back pain 60 tablet 2  . famotidine (PEPCID) 20 MG tablet Take 1 tablet (20 mg total) by mouth 2 (two) times daily. 180 tablet 1  . fluticasone (FLONASE) 50 MCG/ACT nasal spray SPRAY 2 SPRAYS INTO EACH NOSTRIL EVERY DAY (Patient taking differently: Place 2 sprays into both nostrils daily. ) 16 g 2  . gabapentin (NEURONTIN) 300 MG capsule TAKE 1 CAPSULE BY MOUTH TWICE A DAY 180 capsule 1  . hydrALAZINE (APRESOLINE) 25 MG tablet TAKE 1/2 TABLET BY MOUTH 3 TIMES A DAY    . meclizine (ANTIVERT) 25 MG tablet Take 25 mg by mouth 3 (three) times daily as needed for dizziness.     . multivitamin (RENA-VIT) TABS tablet Take 1 tablet by mouth daily at 12 noon.     Marland Kitchen oxyCODONE-acetaminophen (PERCOCET) 5-325 MG tablet Take 1 tablet by mouth every 6 (six) hours as needed for severe pain. 12 tablet 0  . pantoprazole (PROTONIX) 40 MG tablet Take 1 tablet (40 mg total) by mouth daily. (Patient taking differently: Take 40 mg by mouth at bedtime. ) 90 tablet 1  . RENVELA 800 MG tablet Take 800-2,400 mg by mouth See admin instructions. Take 3 tablets (2400 mg) by mouth with each meal & take 1 tablet (800 mg) by mouth with each snack  11  . VENTOLIN HFA 108 (90 Base) MCG/ACT inhaler INHALE 2 PUFFS BY MOUTH EVERY 6 HOURS AS NEEDED FOR WHEEZE OR SHORTNESS OF BREATH (Patient taking differently: Inhale 2 puffs into the lungs every 6 (six) hours as needed for wheezing or shortness of breath. ) 18 Inhaler 1   No current facility-administered medications on file prior to visit.     Physical exam:  Vitals:   09/29/18 1415  BP: (!) 193/100  Pulse: 83  Resp: 18  Temp: 97.8 F (36.6 C)  SpO2: 98%  Weight: 166 lb (75.3 kg)  Height: 5\' 2"  (1.575 m)    Right upper extremity: Healing right antecubital incision no incisional drainage or erythema.  2+ brachial and radial pulse right arm the mass in her right upper arm is completely resolved  Left upper extremity: Palpable thrill in left upper arm AV graft.  No obvious left chest wall collaterals but she does have scars from previous left side catheters.  I reviewed her previous shuntogram reports.  Last angioplasty was in February 2020.  Was done by Dr. Carlis Abbott.  Assessment: #1 resolution of anastomotic pseudoaneurysm right upper arm AV fistula with resolution of pain  2.  Recurrent left axillary subclavian stenosis we will schedule for repeat angioplasty by Dr. Carlis Abbott next week  Plan: See above  Ruta Hinds, MD [facility] Office: [phone] Pager: [phone]

## 2018-09-30 DIAGNOSIS — D509 Iron deficiency anemia, unspecified: Secondary | ICD-10-CM | POA: Diagnosis not present

## 2018-09-30 DIAGNOSIS — N2581 Secondary hyperparathyroidism of renal origin: Secondary | ICD-10-CM | POA: Diagnosis not present

## 2018-09-30 DIAGNOSIS — D631 Anemia in chronic kidney disease: Secondary | ICD-10-CM | POA: Diagnosis not present

## 2018-09-30 DIAGNOSIS — N186 End stage renal disease: Secondary | ICD-10-CM | POA: Diagnosis not present

## 2018-09-30 DIAGNOSIS — E1129 Type 2 diabetes mellitus with other diabetic kidney complication: Secondary | ICD-10-CM | POA: Diagnosis not present

## 2018-10-03 ENCOUNTER — Other Ambulatory Visit (HOSPITAL_COMMUNITY)
Admission: RE | Admit: 2018-10-03 | Discharge: 2018-10-03 | Disposition: A | Discharge status: Home / Self Care | Payer: Medicare Other | Source: Ambulatory Visit | Attending: Vascular Surgery | Admitting: Vascular Surgery

## 2018-10-03 DIAGNOSIS — E1129 Type 2 diabetes mellitus with other diabetic kidney complication: Secondary | ICD-10-CM | POA: Diagnosis not present

## 2018-10-03 DIAGNOSIS — N186 End stage renal disease: Secondary | ICD-10-CM | POA: Diagnosis not present

## 2018-10-03 DIAGNOSIS — D509 Iron deficiency anemia, unspecified: Secondary | ICD-10-CM | POA: Diagnosis not present

## 2018-10-03 DIAGNOSIS — N2581 Secondary hyperparathyroidism of renal origin: Secondary | ICD-10-CM | POA: Diagnosis not present

## 2018-10-03 DIAGNOSIS — Z1159 Encounter for screening for other viral diseases: Secondary | ICD-10-CM | POA: Diagnosis not present

## 2018-10-03 DIAGNOSIS — D631 Anemia in chronic kidney disease: Secondary | ICD-10-CM | POA: Diagnosis not present

## 2018-10-04 LAB — SARS CORONAVIRUS 2 (TAT 6-24 HRS): SARS Coronavirus 2: NEGATIVE

## 2018-10-05 ENCOUNTER — Ambulatory Visit (HOSPITAL_COMMUNITY)
Admission: RE | Admit: 2018-10-05 | Discharge: 2018-10-05 | Disposition: A | Discharge status: Home / Self Care | Payer: Medicare Other | Attending: Vascular Surgery | Admitting: Vascular Surgery

## 2018-10-05 ENCOUNTER — Other Ambulatory Visit: Payer: Self-pay

## 2018-10-05 ENCOUNTER — Encounter (HOSPITAL_COMMUNITY)
Admission: RE | Disposition: A | Discharge status: Home / Self Care | Payer: Self-pay | Source: Home / Self Care | Attending: Vascular Surgery

## 2018-10-05 DIAGNOSIS — E785 Hyperlipidemia, unspecified: Secondary | ICD-10-CM | POA: Insufficient documentation

## 2018-10-05 DIAGNOSIS — M109 Gout, unspecified: Secondary | ICD-10-CM | POA: Diagnosis not present

## 2018-10-05 DIAGNOSIS — R7303 Prediabetes: Secondary | ICD-10-CM | POA: Diagnosis not present

## 2018-10-05 DIAGNOSIS — N186 End stage renal disease: Secondary | ICD-10-CM | POA: Insufficient documentation

## 2018-10-05 DIAGNOSIS — G4730 Sleep apnea, unspecified: Secondary | ICD-10-CM | POA: Insufficient documentation

## 2018-10-05 DIAGNOSIS — M069 Rheumatoid arthritis, unspecified: Secondary | ICD-10-CM | POA: Diagnosis not present

## 2018-10-05 DIAGNOSIS — Y841 Kidney dialysis as the cause of abnormal reaction of the patient, or of later complication, without mention of misadventure at the time of the procedure: Secondary | ICD-10-CM | POA: Diagnosis not present

## 2018-10-05 DIAGNOSIS — J45909 Unspecified asthma, uncomplicated: Secondary | ICD-10-CM | POA: Diagnosis not present

## 2018-10-05 DIAGNOSIS — Z992 Dependence on renal dialysis: Secondary | ICD-10-CM | POA: Insufficient documentation

## 2018-10-05 DIAGNOSIS — T82858A Stenosis of vascular prosthetic devices, implants and grafts, initial encounter: Secondary | ICD-10-CM | POA: Diagnosis not present

## 2018-10-05 DIAGNOSIS — K219 Gastro-esophageal reflux disease without esophagitis: Secondary | ICD-10-CM | POA: Diagnosis not present

## 2018-10-05 DIAGNOSIS — I120 Hypertensive chronic kidney disease with stage 5 chronic kidney disease or end stage renal disease: Secondary | ICD-10-CM | POA: Diagnosis not present

## 2018-10-05 DIAGNOSIS — E039 Hypothyroidism, unspecified: Secondary | ICD-10-CM | POA: Insufficient documentation

## 2018-10-05 DIAGNOSIS — E7800 Pure hypercholesterolemia, unspecified: Secondary | ICD-10-CM | POA: Insufficient documentation

## 2018-10-05 DIAGNOSIS — T82898A Other specified complication of vascular prosthetic devices, implants and grafts, initial encounter: Secondary | ICD-10-CM | POA: Diagnosis not present

## 2018-10-05 HISTORY — PX: A/V SHUNTOGRAM: CATH118297

## 2018-10-05 HISTORY — PX: PERIPHERAL VASCULAR BALLOON ANGIOPLASTY: CATH118281

## 2018-10-05 LAB — POCT I-STAT, CHEM 8
BUN: 56 mg/dL — ABNORMAL HIGH (ref 8–23)
Calcium, Ion: 0.9 mmol/L — ABNORMAL LOW (ref 1.15–1.40)
Chloride: 100 mmol/L (ref 98–111)
Creatinine, Ser: 9.6 mg/dL — ABNORMAL HIGH (ref 0.44–1.00)
Glucose, Bld: 88 mg/dL (ref 70–99)
HCT: 37 % (ref 36.0–46.0)
Hemoglobin: 12.6 g/dL (ref 12.0–15.0)
Potassium: 4.7 mmol/L (ref 3.5–5.1)
Sodium: 137 mmol/L (ref 135–145)
TCO2: 32 mmol/L (ref 22–32)

## 2018-10-05 SURGERY — A/V SHUNTOGRAM
Anesthesia: LOCAL | Laterality: Left

## 2018-10-05 MED ORDER — LIDOCAINE HCL (PF) 1 % IJ SOLN
INTRAMUSCULAR | Status: AC
  Filled 2018-10-05: qty 30

## 2018-10-05 MED ORDER — LABETALOL HCL 5 MG/ML IV SOLN
INTRAVENOUS | Status: AC
  Filled 2018-10-05: qty 4

## 2018-10-05 MED ORDER — SODIUM CHLORIDE 0.9% FLUSH: 3.0000 mL | Freq: Two times a day (BID) | INTRAVENOUS | Status: DC

## 2018-10-05 MED ORDER — LIDOCAINE HCL (PF) 1 % IJ SOLN
INTRAMUSCULAR | Status: DC | PRN
  Administered 2018-10-05: 5 mL

## 2018-10-05 MED ORDER — HEPARIN SODIUM (PORCINE) 1000 UNIT/ML IJ SOLN
INTRAMUSCULAR | Status: DC | PRN
  Administered 2018-10-05: 3000 [IU] via INTRAVENOUS

## 2018-10-05 MED ORDER — HEPARIN SODIUM (PORCINE) 1000 UNIT/ML IJ SOLN
INTRAMUSCULAR | Status: AC
  Filled 2018-10-05: qty 1

## 2018-10-05 MED ORDER — IODIXANOL 320 MG/ML IV SOLN
INTRAVENOUS | Status: DC | PRN
  Administered 2018-10-05: 75 mL via INTRAVENOUS

## 2018-10-05 MED ORDER — HEPARIN (PORCINE) IN NACL 1000-0.9 UT/500ML-% IV SOLN
INTRAVENOUS | Status: DC | PRN
  Administered 2018-10-05: 500 mL

## 2018-10-05 MED ORDER — HEPARIN (PORCINE) IN NACL 1000-0.9 UT/500ML-% IV SOLN
INTRAVENOUS | Status: AC
  Filled 2018-10-05: qty 500

## 2018-10-05 MED ORDER — SODIUM CHLORIDE 0.9% FLUSH: 3.0000 mL | INTRAVENOUS | Status: DC | PRN

## 2018-10-05 MED ORDER — SODIUM CHLORIDE 0.9 % IV SOLN: 250.0000 mL | INTRAVENOUS | Status: DC | PRN

## 2018-10-05 MED ORDER — LABETALOL HCL 5 MG/ML IV SOLN
INTRAVENOUS | Status: DC | PRN
  Administered 2018-10-05 (×2): 10 mg via INTRAVENOUS

## 2018-10-05 SURGICAL SUPPLY — 18 items
BAG SNAP BAND KOVER 36X36 (MISCELLANEOUS) ×2 IMPLANT
BALLN LUTONIX AV 12X40X75 (BALLOONS) ×2
BALLN MUSTANG 10.0X40 75 (BALLOONS) ×2
BALLN MUSTANG 8X60X75 (BALLOONS) ×2
BALLOON LUTONIX AV 12X40X75 (BALLOONS) ×1 IMPLANT
BALLOON MUSTANG 10.0X40 75 (BALLOONS) ×1 IMPLANT
BALLOON MUSTANG 8X60X75 (BALLOONS) ×1 IMPLANT
COVER DOME SNAP 22 D (MISCELLANEOUS) ×2 IMPLANT
KIT ENCORE 26 ADVANTAGE (KITS) ×4 IMPLANT
KIT MICROPUNCTURE NIT STIFF (SHEATH) ×2 IMPLANT
PROTECTION STATION PRESSURIZED (MISCELLANEOUS) ×2
SHEATH PINNACLE 9F 10CM (SHEATH) ×2 IMPLANT
SHEATH PROBE COVER 6X72 (BAG) ×2 IMPLANT
STATION PROTECTION PRESSURIZED (MISCELLANEOUS) ×1 IMPLANT
STOPCOCK MORSE 400PSI 3WAY (MISCELLANEOUS) ×2 IMPLANT
TRAY PV CATH (CUSTOM PROCEDURE TRAY) ×2 IMPLANT
TUBING CIL FLEX 10 FLL-RA (TUBING) ×2 IMPLANT
WIRE BENTSON .035X145CM (WIRE) ×2 IMPLANT

## 2018-10-05 NOTE — Op Note (Signed)
OPERATIVE NOTE   PROCEDURE: 1. Left arteriovenous graft cannulation under ultrasound guidance 2. Left arm fistulogram including central venogram 3. Central venoplasty - left innominate vein (10 mm x 40 mm Mustang and 12 mm x 40 mm drug coated Lutonix) 4. 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  I debated using a larger 14 mm balloon, but she had significant chest pain the the 12 mm balloon and I stopped.  I did not feel the lesion was amendable to stenting given I would jail the right innominate.  There was a second approximate 50% stenosis of the venous outflow tract of the upper arm that was angioplastied with an 8 mm Mustang with no residual stenosis.  The arterial limb of the upper arm loop graft had no significant stenosis and was widely patent.  SPECIMEN(S):  None  CONTRAST: 75 cc  INDICATIONS: Rachael George is a 63 y.o. female who  presents with malfunctioning left upper arm arteriovenous loop graft.  The patient is scheduled for left arm fistulogram.  The patient is aware the risks include but are not limited to: bleeding, infection, thrombosis of the cannulated access, and possible anaphylactic reaction to the contrast.  The patient is aware of the risks of the procedure and elects to proceed forward.   DESCRIPTION: After full informed written consent was obtained, the patient was brought back to the angiography suite and placed supine upon the angiography table.  The patient was connected to monitoring equipment.  The left arm was  prepped and draped in the standard fashion for a left arm fistulogram.  Under ultrasound guidance, the left upper arm arteriovenous graft was evaluated, it was patent, an image was saved.  Using ultrasound the graft was cannulated with a micropuncture needle.  The microwire was advanced into the fistula and the needle was exchanged for the a microsheath, which was lodged 2 cm into the access.  The wire was removed and the sheath was connected to the IV extension tubing.  Hand injections were completed to image the access with pertinent findings noted above.  I then placed a Bentson wire and exchanged for a short 9 [place] sheath.  The patient was given 3000 units of IV heparin.  Ultimately was able advance my Bentson wire across the innominate stenosis.  A hand-injection did confirm through the sheath that we were across the lesion.  Initially selected a 10 mm x 40 mm Mustang and then a second 12 mm x 40 mm drug coated Lutonix that was deployed to nominal pressure across the innominate stenosis for 2 minutes each.  Patient was having significant chest pain during intervention and I did not feel comfortable using a larger balloon.  There was approximately 50% residual stenosis.  At that point in time we then treated the second lesion in the upper arm at the outflow with an 8 mm x 60 mm Mustang and there was no residual stenosis.  A retrograde shot showed the arterial side of the loop graft widely patent.  There was a thrill improved from a pulse.  At  that point in time our 9 French sheath was removed after 4-0 monocryl pursestring was placed and pressure was held for 5 minutes.  She will be taken the PACU in stable condition.  COMPLICATIONS: None  CONDITION: Stable  Marty Heck, MD [facility] Office: [phone] Pager: [phone]  10/05/2018 2:53 PM

## 2018-10-05 NOTE — H&P (Signed)
History and Physical Interval Note:  10/05/2018 2:01 PM  Rachael George  has presented today for surgery, with the diagnosis of Poor flow.  The various methods of treatment have been discussed with the patient and family. After consideration of risks, benefits and other options for treatment, the patient has consented to  Procedure(s): A/V SHUNTOGRAM (Left) as a surgical intervention.  The patient's history has been reviewed, patient examined, no change in status, stable for surgery.  I have reviewed the patient's chart and labs.  Questions were answered to the patient's satisfaction.     Left arm fistulogram.   Rachael George  Signed         Show:Clear all [x] Manual[x] Template[] Copied  Added by: [x] Fields, Jessy Oto, MD  [] Hover for details Patient is a 63 year old female who returns for follow-up today.  She recently underwent resection of a pseudoaneurysm from a old nonfunctioning fistula in her right upper arm.  She had primary repair and resection of the pseudoaneurysm.  She reports that the pain in her upper arm has completely resolved.  She has no incisional drainage.  She has no redness around the incision.   She has a new problem with pain in her left axillary region.  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  This is been angioplastied on multiple prior occasions.  This is usually done with a 12 mm balloon.  The access is functioning but they do have some decreased access flow.  She has no numbness or tingling in her left hand.  Her dialysis today is Monday Wednesday Friday.  She is considering switching to peritoneal dialysis.  Review of systems: She denies fever or chills.  She denies shortness of breath.  She denies chest pain.      Past Medical History:  Diagnosis Date  . Anemia   . Asthma   . ESRD (end stage renal disease) ([place])    MWF  . GERD (gastroesophageal reflux disease)   .  Gout   . 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 Heavy menstrual bleeding   . High cholesterol   . History of blood transfusion 02/2016- last one   1990's- several ones   . History of hiatal hernia   . History of kidney stones 1980's  . Hyperlipemia 12/07/2012  . Hypertension   . Hypothyroidism   . Multiple gastric ulcers   . Pneumonia 2012  . Pre-diabetes   . Prediabetes   . RA (rheumatoid arthritis) ([place])   . Sleep apnea    [place]- have CPAP, not able to use every night          Past Surgical History:  Procedure Laterality Date  . A/V SHUNTOGRAM N/A 09/08/2016   Procedure: A/V Shuntogram - Left Arm AV Graft;  Surgeon: Serafina Mitchell, MD;  Location: [place] [facility];  Service: Cardiovascular;  Laterality: N/A;  . A/V SHUNTOGRAM N/A 09/02/2017   Procedure: A/V SHUNTOGRAM - Left Arm AVG;  Surgeon: Conrad Harrison, MD;  Location: [place] [facility];  Service: Cardiovascular;  Laterality: N/A;  . A/V SHUNTOGRAM N/A 12/21/2017   Procedure: A/V SHUNTOGRAM - left arm;  Surgeon: Serafina Mitchell, MD;  Location: [place] [facility];  Service: Cardiovascular;  Laterality: N/A;  . ABDOMINAL HYSTERECTOMY  2000  . AV FISTULA PLACEMENT Left 05/25/2016   Procedure: INSERTION OF LEFT UPPER ARM ARTERIOVENOUS (AV) LOOP GORE-TEX  GRAFT ARM;  Surgeon: Elam Dutch, MD;  Location: [place];  Service: Vascular;  Laterality: Left;  . BASCILIC VEIN TRANSPOSITION Right 12/06/2014   Procedure: FIRST STAGE BASILIC VEIN TRANSPOSITION - RIGHT;  Surgeon: Serafina Mitchell, MD;  Location: [place];  Service: Vascular;  Laterality: Right;  . BASCILIC VEIN TRANSPOSITION Right 02/07/2015   Procedure: RIGHT ARM SECOND STAGE BASILIC VEIN TRANSPOSITION;  Surgeon: Serafina Mitchell, MD;  Location: [place];  Service: Vascular;  Laterality: Right;  . BASCILIC VEIN TRANSPOSITION Left 04/22/2016   Procedure: FIRST STAGE BASILIC VEIN TRANSPOSITION LEFT ARM;  Surgeon: Conrad [place], MD;   Location: [place];  Service: Vascular;  Laterality: Left;  . COLONOSCOPY W/ POLYPECTOMY    . ECTOPIC PREGNANCY SURGERY  02/1982  . EXCHANGE OF A DIALYSIS CATHETER Right 04/22/2016   Procedure: EXCHANGE OF A DIALYSIS CATHETER - INSERTION RIGHT INTERNAL JUGULAR & REMOVAL FROM LEFT INTERNAL JUGULAR;  Surgeon: Conrad Packwood, MD;  Location: [place];  Service: Vascular;  Laterality: Right;  . I&D EXTREMITY Right 02/07/2015   Procedure: IRRIGATION AND DEBRIDEMENT RIGHT ARM HEMATOMA;  Surgeon: Serafina Mitchell, MD;  Location: [place];  Service: Vascular;  Laterality: Right;  . INSERTION OF DIALYSIS CATHETER  03/03/2016   Procedure: INSERTION OF DIALYSIS CATHETER;  Surgeon: Elam Dutch, MD;  Location: [place];  Service: Vascular;;  . IR AV DIALY SHUNT INTRO NEEDLE/INTRACATH INITIAL W/PTA/IMG LEFT  04/20/2017  . IR AV DIALY SHUNT INTRO NEEDLE/INTRACATH INITIAL W/PTA/IMG LEFT  06/24/2017  . IR THROMBECTOMY AV FISTULA W/THROMBOLYSIS/PTA INC/SHUNT/IMG LEFT Left 01/04/2017  . IR US GUIDE VASC ACCESS LEFT  01/04/2017  . left shoulder surgery  08/2005  . LIGATION OF ARTERIOVENOUS  FISTULA  03/03/2016   Procedure: LIGATION OF ARTERIOVENOUS  FISTULA;  Surgeon: Elam Dutch, MD;  Location: [facility];  Service: Vascular;;  . LIGATION OF ARTERIOVENOUS  FISTULA Right 09/06/2018   Procedure: Resection of pseudoaneursym and end to end repair of right brachial artery;  Surgeon: Elam Dutch, MD;  Location: [facility] OR;  Service: Vascular;  Laterality: Right;  . PERIPHERAL VASCULAR BALLOON ANGIOPLASTY  09/08/2016   Procedure: Peripheral Vascular Balloon Angioplasty;  Surgeon: Serafina Mitchell, MD;  Location: [place] [facility];  Service: Cardiovascular;;  left avf  . PERIPHERAL VASCULAR BALLOON ANGIOPLASTY  09/02/2017   Procedure: PERIPHERAL VASCULAR BALLOON ANGIOPLASTY;  Surgeon: Conrad Frederick, MD;  Location: [place] [facility];  Service: Cardiovascular;;  left AV Graft  . PERIPHERAL VASCULAR BALLOON ANGIOPLASTY   12/21/2017   Procedure: PERIPHERAL VASCULAR BALLOON ANGIOPLASTY;  Surgeon: Serafina Mitchell, MD;  Location: [place] [facility];  Service: Cardiovascular;;  INOMINATE / UPPER ARM AV GRAFT  . PERIPHERAL VASCULAR BALLOON ANGIOPLASTY Left 05/12/2018   Procedure: PERIPHERAL VASCULAR BALLOON ANGIOPLASTY;  Surgeon: Rachael Heck, MD;  Location: [place] [facility];  Service: Cardiovascular;  Laterality: Left;  Arm fistula  . TEE WITHOUT CARDIOVERSION N/A 03/06/2016   Procedure: TRANSESOPHAGEAL ECHOCARDIOGRAM (TEE);  Surgeon: Sueanne Margarita, MD;  Location: [facility];  Service: Cardiovascular;  Laterality: N/A;  . TUBAL LIGATION  11/1986          Current Outpatient Medications on File Prior to Visit  Medication Sig Dispense Refill  . acetaminophen (TYLENOL) 650 MG CR tablet Take 650-1,300 mg by mouth every 8 (eight) hours as needed for pain.    Marland Kitchen albuterol (PROVENTIL) (2.5 MG/3ML) 0.083% nebulizer solution Take 3 mLs (2.5 mg total) by nebulization every 6 (six)  hours as needed for wheezing or shortness of breath. ICD 10:J45.20 75 mL 12  . allopurinol (ZYLOPRIM) 100 MG tablet Take 1 tablet (100 mg total) by mouth 2 (two) times daily. 60 tablet 6  . amLODipine (NORVASC) 10 MG tablet TAKE 1 TABLET BY MOUTH EVERYDAY AT BEDTIME (Patient taking differently: Take 10 mg by mouth at bedtime. TAKE 1 TABLET BY MOUTH EVERYDAY AT BEDTIME) 30 tablet 6  . atorvastatin (LIPITOR) 80 MG tablet Take 1 tablet (80 mg total) by mouth daily. (Patient taking differently: Take 80 mg by mouth at bedtime. ) 30 tablet 6  . budesonide-formoterol (SYMBICORT) 160-4.5 MCG/ACT inhaler Inhale 2 puffs into the lungs 2 (two) times daily for 1 day. 1 Inhaler 6  . carvedilol (COREG) 25 MG tablet     . chlorpheniramine (CHLOR-TRIMETON) 4 MG tablet Take 1 tablet (4 mg total) by mouth 3 (three) times daily. (Patient taking differently: Take 8 mg by mouth daily at 12 noon. ) 190 tablet 0  . clotrimazole (LOTRIMIN) 1 % cream Apply  1 application topically 2 (two) times daily. (Patient taking differently: Apply 1 application topically 2 (two) times daily as needed (skin irritation.). ) 30 g 1  . cyclobenzaprine (FLEXERIL) 10 MG tablet Take 1 tablet (10 mg total) by mouth 2 (two) times daily as needed. For back pain 60 tablet 2  . famotidine (PEPCID) 20 MG tablet Take 1 tablet (20 mg total) by mouth 2 (two) times daily. 180 tablet 1  . fluticasone (FLONASE) 50 MCG/ACT nasal spray SPRAY 2 SPRAYS INTO EACH NOSTRIL EVERY DAY (Patient taking differently: Place 2 sprays into both nostrils daily. ) 16 g 2  . gabapentin (NEURONTIN) 300 MG capsule TAKE 1 CAPSULE BY MOUTH TWICE A DAY 180 capsule 1  . hydrALAZINE (APRESOLINE) 25 MG tablet TAKE 1/2 TABLET BY MOUTH 3 TIMES A DAY    . meclizine (ANTIVERT) 25 MG tablet Take 25 mg by mouth 3 (three) times daily as needed for dizziness.    . multivitamin (RENA-VIT) TABS tablet Take 1 tablet by mouth daily at 12 noon.     Marland Kitchen oxyCODONE-acetaminophen (PERCOCET) 5-325 MG tablet Take 1 tablet by mouth every 6 (six) hours as needed for severe pain. 12 tablet 0  . pantoprazole (PROTONIX) 40 MG tablet Take 1 tablet (40 mg total) by mouth daily. (Patient taking differently: Take 40 mg by mouth at bedtime. ) 90 tablet 1  . RENVELA 800 MG tablet Take 800-2,400 mg by mouth See admin instructions. Take 3 tablets (2400 mg) by mouth with each meal & take 1 tablet (800 mg) by mouth with each snack  11  . VENTOLIN HFA 108 (90 Base) MCG/ACT inhaler INHALE 2 PUFFS BY MOUTH EVERY 6 HOURS AS NEEDED FOR WHEEZE OR SHORTNESS OF BREATH (Patient taking differently: Inhale 2 puffs into the lungs every 6 (six) hours as needed for wheezing or shortness of breath. ) 18 Inhaler 1   No current facility-administered medications on file prior to visit.     Physical exam:     Vitals:   09/29/18 1415  BP: (!) 193/100  Pulse: 83  Resp: 18  Temp: 97.8 F (36.6 C)  SpO2: 98%  Weight: 166 lb (75.3 kg)  Height: 5'  2" (1.575 m)    Right upper extremity: Healing right antecubital incision no incisional drainage or erythema.  2+ brachial and radial pulse right arm the mass in her right upper arm is completely resolved  Left upper extremity: Palpable thrill in left  upper arm AV graft.  No obvious left chest wall collaterals but she does have scars from previous left side catheters.  I reviewed her previous shuntogram reports.  Last angioplasty was in February 2020.  Was done by Dr. Carlis Abbott.  Assessment: #1 resolution of anastomotic pseudoaneurysm right upper arm AV fistula with resolution of pain  2.  Recurrent left axillary subclavian stenosis we will schedule for repeat angioplasty by Dr. Carlis Abbott next week  Plan: See above  Rachael Hinds, MD [facility] Office: [phone] Pager: [phone]

## 2018-10-05 NOTE — Discharge Instructions (Signed)

## 2018-10-06 ENCOUNTER — Other Ambulatory Visit: Payer: Self-pay | Admitting: Family Medicine

## 2018-10-06 ENCOUNTER — Encounter (HOSPITAL_COMMUNITY): Payer: Self-pay | Admitting: Vascular Surgery

## 2018-10-06 DIAGNOSIS — K2100 Gastro-esophageal reflux disease with esophagitis, without bleeding: Secondary | ICD-10-CM

## 2018-10-07 DIAGNOSIS — D631 Anemia in chronic kidney disease: Secondary | ICD-10-CM | POA: Diagnosis not present

## 2018-10-07 DIAGNOSIS — N186 End stage renal disease: Secondary | ICD-10-CM | POA: Diagnosis not present

## 2018-10-07 DIAGNOSIS — N2581 Secondary hyperparathyroidism of renal origin: Secondary | ICD-10-CM | POA: Diagnosis not present

## 2018-10-07 DIAGNOSIS — E1129 Type 2 diabetes mellitus with other diabetic kidney complication: Secondary | ICD-10-CM | POA: Diagnosis not present

## 2018-10-07 DIAGNOSIS — D509 Iron deficiency anemia, unspecified: Secondary | ICD-10-CM | POA: Diagnosis not present

## 2018-10-10 DIAGNOSIS — D631 Anemia in chronic kidney disease: Secondary | ICD-10-CM | POA: Diagnosis not present

## 2018-10-10 DIAGNOSIS — D509 Iron deficiency anemia, unspecified: Secondary | ICD-10-CM | POA: Diagnosis not present

## 2018-10-10 DIAGNOSIS — N2581 Secondary hyperparathyroidism of renal origin: Secondary | ICD-10-CM | POA: Diagnosis not present

## 2018-10-10 DIAGNOSIS — N186 End stage renal disease: Secondary | ICD-10-CM | POA: Diagnosis not present

## 2018-10-10 DIAGNOSIS — E1129 Type 2 diabetes mellitus with other diabetic kidney complication: Secondary | ICD-10-CM | POA: Diagnosis not present

## 2018-10-12 DIAGNOSIS — D631 Anemia in chronic kidney disease: Secondary | ICD-10-CM | POA: Diagnosis not present

## 2018-10-12 DIAGNOSIS — N2581 Secondary hyperparathyroidism of renal origin: Secondary | ICD-10-CM | POA: Diagnosis not present

## 2018-10-12 DIAGNOSIS — D509 Iron deficiency anemia, unspecified: Secondary | ICD-10-CM | POA: Diagnosis not present

## 2018-10-12 DIAGNOSIS — N186 End stage renal disease: Secondary | ICD-10-CM | POA: Diagnosis not present

## 2018-10-12 DIAGNOSIS — E1129 Type 2 diabetes mellitus with other diabetic kidney complication: Secondary | ICD-10-CM | POA: Diagnosis not present

## 2018-10-14 DIAGNOSIS — N2581 Secondary hyperparathyroidism of renal origin: Secondary | ICD-10-CM | POA: Diagnosis not present

## 2018-10-14 DIAGNOSIS — E1129 Type 2 diabetes mellitus with other diabetic kidney complication: Secondary | ICD-10-CM | POA: Diagnosis not present

## 2018-10-14 DIAGNOSIS — D509 Iron deficiency anemia, unspecified: Secondary | ICD-10-CM | POA: Diagnosis not present

## 2018-10-14 DIAGNOSIS — N186 End stage renal disease: Secondary | ICD-10-CM | POA: Diagnosis not present

## 2018-10-14 DIAGNOSIS — D631 Anemia in chronic kidney disease: Secondary | ICD-10-CM | POA: Diagnosis not present

## 2018-10-17 DIAGNOSIS — D631 Anemia in chronic kidney disease: Secondary | ICD-10-CM | POA: Diagnosis not present

## 2018-10-17 DIAGNOSIS — N2581 Secondary hyperparathyroidism of renal origin: Secondary | ICD-10-CM | POA: Diagnosis not present

## 2018-10-17 DIAGNOSIS — E1129 Type 2 diabetes mellitus with other diabetic kidney complication: Secondary | ICD-10-CM | POA: Diagnosis not present

## 2018-10-17 DIAGNOSIS — N186 End stage renal disease: Secondary | ICD-10-CM | POA: Diagnosis not present

## 2018-10-17 DIAGNOSIS — D509 Iron deficiency anemia, unspecified: Secondary | ICD-10-CM | POA: Diagnosis not present

## 2018-10-19 DIAGNOSIS — N2581 Secondary hyperparathyroidism of renal origin: Secondary | ICD-10-CM | POA: Diagnosis not present

## 2018-10-19 DIAGNOSIS — D509 Iron deficiency anemia, unspecified: Secondary | ICD-10-CM | POA: Diagnosis not present

## 2018-10-19 DIAGNOSIS — E1129 Type 2 diabetes mellitus with other diabetic kidney complication: Secondary | ICD-10-CM | POA: Diagnosis not present

## 2018-10-19 DIAGNOSIS — N186 End stage renal disease: Secondary | ICD-10-CM | POA: Diagnosis not present

## 2018-10-19 DIAGNOSIS — D631 Anemia in chronic kidney disease: Secondary | ICD-10-CM | POA: Diagnosis not present

## 2018-10-21 DIAGNOSIS — D509 Iron deficiency anemia, unspecified: Secondary | ICD-10-CM | POA: Diagnosis not present

## 2018-10-21 DIAGNOSIS — D631 Anemia in chronic kidney disease: Secondary | ICD-10-CM | POA: Diagnosis not present

## 2018-10-21 DIAGNOSIS — E1129 Type 2 diabetes mellitus with other diabetic kidney complication: Secondary | ICD-10-CM | POA: Diagnosis not present

## 2018-10-21 DIAGNOSIS — N2581 Secondary hyperparathyroidism of renal origin: Secondary | ICD-10-CM | POA: Diagnosis not present

## 2018-10-21 DIAGNOSIS — N186 End stage renal disease: Secondary | ICD-10-CM | POA: Diagnosis not present

## 2018-10-22 DIAGNOSIS — N186 End stage renal disease: Secondary | ICD-10-CM | POA: Diagnosis not present

## 2018-10-22 DIAGNOSIS — Z992 Dependence on renal dialysis: Secondary | ICD-10-CM | POA: Diagnosis not present

## 2018-10-22 DIAGNOSIS — I129 Hypertensive chronic kidney disease with stage 1 through stage 4 chronic kidney disease, or unspecified chronic kidney disease: Secondary | ICD-10-CM | POA: Diagnosis not present

## 2018-10-24 DIAGNOSIS — T782XXA Anaphylactic shock, unspecified, initial encounter: Secondary | ICD-10-CM | POA: Insufficient documentation

## 2018-10-24 DIAGNOSIS — N186 End stage renal disease: Secondary | ICD-10-CM | POA: Diagnosis not present

## 2018-10-24 DIAGNOSIS — D631 Anemia in chronic kidney disease: Secondary | ICD-10-CM | POA: Diagnosis not present

## 2018-10-24 DIAGNOSIS — T7840XA Allergy, unspecified, initial encounter: Secondary | ICD-10-CM | POA: Insufficient documentation

## 2018-10-24 DIAGNOSIS — E1129 Type 2 diabetes mellitus with other diabetic kidney complication: Secondary | ICD-10-CM | POA: Diagnosis not present

## 2018-10-24 DIAGNOSIS — Z992 Dependence on renal dialysis: Secondary | ICD-10-CM | POA: Diagnosis not present

## 2018-10-24 DIAGNOSIS — N2581 Secondary hyperparathyroidism of renal origin: Secondary | ICD-10-CM | POA: Diagnosis not present

## 2018-10-26 DIAGNOSIS — N186 End stage renal disease: Secondary | ICD-10-CM | POA: Diagnosis not present

## 2018-10-26 DIAGNOSIS — N2581 Secondary hyperparathyroidism of renal origin: Secondary | ICD-10-CM | POA: Diagnosis not present

## 2018-10-26 DIAGNOSIS — E1129 Type 2 diabetes mellitus with other diabetic kidney complication: Secondary | ICD-10-CM | POA: Diagnosis not present

## 2018-10-26 DIAGNOSIS — Z992 Dependence on renal dialysis: Secondary | ICD-10-CM | POA: Diagnosis not present

## 2018-10-26 DIAGNOSIS — D631 Anemia in chronic kidney disease: Secondary | ICD-10-CM | POA: Diagnosis not present

## 2018-10-26 DIAGNOSIS — T7840XA Allergy, unspecified, initial encounter: Secondary | ICD-10-CM | POA: Diagnosis not present

## 2018-10-28 DIAGNOSIS — Z992 Dependence on renal dialysis: Secondary | ICD-10-CM | POA: Diagnosis not present

## 2018-10-28 DIAGNOSIS — D631 Anemia in chronic kidney disease: Secondary | ICD-10-CM | POA: Diagnosis not present

## 2018-10-28 DIAGNOSIS — N186 End stage renal disease: Secondary | ICD-10-CM | POA: Diagnosis not present

## 2018-10-28 DIAGNOSIS — T7840XA Allergy, unspecified, initial encounter: Secondary | ICD-10-CM | POA: Diagnosis not present

## 2018-10-28 DIAGNOSIS — E1129 Type 2 diabetes mellitus with other diabetic kidney complication: Secondary | ICD-10-CM | POA: Diagnosis not present

## 2018-10-28 DIAGNOSIS — N2581 Secondary hyperparathyroidism of renal origin: Secondary | ICD-10-CM | POA: Diagnosis not present

## 2018-11-02 DIAGNOSIS — N186 End stage renal disease: Secondary | ICD-10-CM | POA: Diagnosis not present

## 2018-11-02 DIAGNOSIS — E1129 Type 2 diabetes mellitus with other diabetic kidney complication: Secondary | ICD-10-CM | POA: Diagnosis not present

## 2018-11-02 DIAGNOSIS — Z992 Dependence on renal dialysis: Secondary | ICD-10-CM | POA: Diagnosis not present

## 2018-11-02 DIAGNOSIS — T7840XA Allergy, unspecified, initial encounter: Secondary | ICD-10-CM | POA: Diagnosis not present

## 2018-11-02 DIAGNOSIS — N2581 Secondary hyperparathyroidism of renal origin: Secondary | ICD-10-CM | POA: Diagnosis not present

## 2018-11-02 DIAGNOSIS — D631 Anemia in chronic kidney disease: Secondary | ICD-10-CM | POA: Diagnosis not present

## 2018-11-04 DIAGNOSIS — E1129 Type 2 diabetes mellitus with other diabetic kidney complication: Secondary | ICD-10-CM | POA: Diagnosis not present

## 2018-11-04 DIAGNOSIS — N186 End stage renal disease: Secondary | ICD-10-CM | POA: Diagnosis not present

## 2018-11-04 DIAGNOSIS — D631 Anemia in chronic kidney disease: Secondary | ICD-10-CM | POA: Diagnosis not present

## 2018-11-04 DIAGNOSIS — T7840XA Allergy, unspecified, initial encounter: Secondary | ICD-10-CM | POA: Diagnosis not present

## 2018-11-04 DIAGNOSIS — Z992 Dependence on renal dialysis: Secondary | ICD-10-CM | POA: Diagnosis not present

## 2018-11-04 DIAGNOSIS — N2581 Secondary hyperparathyroidism of renal origin: Secondary | ICD-10-CM | POA: Diagnosis not present

## 2018-11-07 DIAGNOSIS — N186 End stage renal disease: Secondary | ICD-10-CM | POA: Diagnosis not present

## 2018-11-07 DIAGNOSIS — E1129 Type 2 diabetes mellitus with other diabetic kidney complication: Secondary | ICD-10-CM | POA: Diagnosis not present

## 2018-11-07 DIAGNOSIS — D631 Anemia in chronic kidney disease: Secondary | ICD-10-CM | POA: Diagnosis not present

## 2018-11-07 DIAGNOSIS — N2581 Secondary hyperparathyroidism of renal origin: Secondary | ICD-10-CM | POA: Diagnosis not present

## 2018-11-07 DIAGNOSIS — Z992 Dependence on renal dialysis: Secondary | ICD-10-CM | POA: Diagnosis not present

## 2018-11-07 DIAGNOSIS — T7840XA Allergy, unspecified, initial encounter: Secondary | ICD-10-CM | POA: Diagnosis not present

## 2018-11-08 DIAGNOSIS — Z1231 Encounter for screening mammogram for malignant neoplasm of breast: Secondary | ICD-10-CM | POA: Diagnosis not present

## 2018-11-09 DIAGNOSIS — N186 End stage renal disease: Secondary | ICD-10-CM | POA: Diagnosis not present

## 2018-11-09 DIAGNOSIS — Z992 Dependence on renal dialysis: Secondary | ICD-10-CM | POA: Diagnosis not present

## 2018-11-09 DIAGNOSIS — E1129 Type 2 diabetes mellitus with other diabetic kidney complication: Secondary | ICD-10-CM | POA: Diagnosis not present

## 2018-11-09 DIAGNOSIS — T7840XA Allergy, unspecified, initial encounter: Secondary | ICD-10-CM | POA: Diagnosis not present

## 2018-11-09 DIAGNOSIS — D631 Anemia in chronic kidney disease: Secondary | ICD-10-CM | POA: Diagnosis not present

## 2018-11-09 DIAGNOSIS — N2581 Secondary hyperparathyroidism of renal origin: Secondary | ICD-10-CM | POA: Diagnosis not present

## 2018-11-11 DIAGNOSIS — E1129 Type 2 diabetes mellitus with other diabetic kidney complication: Secondary | ICD-10-CM | POA: Diagnosis not present

## 2018-11-11 DIAGNOSIS — N2581 Secondary hyperparathyroidism of renal origin: Secondary | ICD-10-CM | POA: Diagnosis not present

## 2018-11-11 DIAGNOSIS — Z992 Dependence on renal dialysis: Secondary | ICD-10-CM | POA: Diagnosis not present

## 2018-11-11 DIAGNOSIS — T7840XA Allergy, unspecified, initial encounter: Secondary | ICD-10-CM | POA: Diagnosis not present

## 2018-11-11 DIAGNOSIS — N186 End stage renal disease: Secondary | ICD-10-CM | POA: Diagnosis not present

## 2018-11-11 DIAGNOSIS — D631 Anemia in chronic kidney disease: Secondary | ICD-10-CM | POA: Diagnosis not present

## 2018-11-14 DIAGNOSIS — N186 End stage renal disease: Secondary | ICD-10-CM | POA: Diagnosis not present

## 2018-11-14 DIAGNOSIS — T7840XA Allergy, unspecified, initial encounter: Secondary | ICD-10-CM | POA: Diagnosis not present

## 2018-11-14 DIAGNOSIS — Z992 Dependence on renal dialysis: Secondary | ICD-10-CM | POA: Diagnosis not present

## 2018-11-14 DIAGNOSIS — E1129 Type 2 diabetes mellitus with other diabetic kidney complication: Secondary | ICD-10-CM | POA: Diagnosis not present

## 2018-11-14 DIAGNOSIS — D631 Anemia in chronic kidney disease: Secondary | ICD-10-CM | POA: Diagnosis not present

## 2018-11-14 DIAGNOSIS — N2581 Secondary hyperparathyroidism of renal origin: Secondary | ICD-10-CM | POA: Diagnosis not present

## 2018-11-16 ENCOUNTER — Other Ambulatory Visit: Payer: Self-pay | Admitting: Family Medicine

## 2018-11-16 DIAGNOSIS — E1129 Type 2 diabetes mellitus with other diabetic kidney complication: Secondary | ICD-10-CM | POA: Diagnosis not present

## 2018-11-16 DIAGNOSIS — Z992 Dependence on renal dialysis: Secondary | ICD-10-CM | POA: Diagnosis not present

## 2018-11-16 DIAGNOSIS — D631 Anemia in chronic kidney disease: Secondary | ICD-10-CM | POA: Diagnosis not present

## 2018-11-16 DIAGNOSIS — T7840XA Allergy, unspecified, initial encounter: Secondary | ICD-10-CM | POA: Diagnosis not present

## 2018-11-16 DIAGNOSIS — N186 End stage renal disease: Secondary | ICD-10-CM | POA: Diagnosis not present

## 2018-11-16 DIAGNOSIS — N2581 Secondary hyperparathyroidism of renal origin: Secondary | ICD-10-CM | POA: Diagnosis not present

## 2018-11-18 DIAGNOSIS — E1129 Type 2 diabetes mellitus with other diabetic kidney complication: Secondary | ICD-10-CM | POA: Diagnosis not present

## 2018-11-18 DIAGNOSIS — D631 Anemia in chronic kidney disease: Secondary | ICD-10-CM | POA: Diagnosis not present

## 2018-11-18 DIAGNOSIS — Z992 Dependence on renal dialysis: Secondary | ICD-10-CM | POA: Diagnosis not present

## 2018-11-18 DIAGNOSIS — N186 End stage renal disease: Secondary | ICD-10-CM | POA: Diagnosis not present

## 2018-11-18 DIAGNOSIS — N2581 Secondary hyperparathyroidism of renal origin: Secondary | ICD-10-CM | POA: Diagnosis not present

## 2018-11-18 DIAGNOSIS — T7840XA Allergy, unspecified, initial encounter: Secondary | ICD-10-CM | POA: Diagnosis not present

## 2018-11-21 DIAGNOSIS — D631 Anemia in chronic kidney disease: Secondary | ICD-10-CM | POA: Diagnosis not present

## 2018-11-21 DIAGNOSIS — E1129 Type 2 diabetes mellitus with other diabetic kidney complication: Secondary | ICD-10-CM | POA: Diagnosis not present

## 2018-11-21 DIAGNOSIS — N2581 Secondary hyperparathyroidism of renal origin: Secondary | ICD-10-CM | POA: Diagnosis not present

## 2018-11-21 DIAGNOSIS — Z992 Dependence on renal dialysis: Secondary | ICD-10-CM | POA: Diagnosis not present

## 2018-11-21 DIAGNOSIS — T7840XA Allergy, unspecified, initial encounter: Secondary | ICD-10-CM | POA: Diagnosis not present

## 2018-11-21 DIAGNOSIS — N186 End stage renal disease: Secondary | ICD-10-CM | POA: Diagnosis not present

## 2018-11-22 DIAGNOSIS — I129 Hypertensive chronic kidney disease with stage 1 through stage 4 chronic kidney disease, or unspecified chronic kidney disease: Secondary | ICD-10-CM | POA: Diagnosis not present

## 2018-11-22 DIAGNOSIS — N186 End stage renal disease: Secondary | ICD-10-CM | POA: Diagnosis not present

## 2018-11-22 DIAGNOSIS — Z992 Dependence on renal dialysis: Secondary | ICD-10-CM | POA: Diagnosis not present

## 2018-11-23 DIAGNOSIS — E1129 Type 2 diabetes mellitus with other diabetic kidney complication: Secondary | ICD-10-CM | POA: Diagnosis not present

## 2018-11-23 DIAGNOSIS — D631 Anemia in chronic kidney disease: Secondary | ICD-10-CM | POA: Diagnosis not present

## 2018-11-23 DIAGNOSIS — Z992 Dependence on renal dialysis: Secondary | ICD-10-CM | POA: Diagnosis not present

## 2018-11-23 DIAGNOSIS — N2581 Secondary hyperparathyroidism of renal origin: Secondary | ICD-10-CM | POA: Diagnosis not present

## 2018-11-23 DIAGNOSIS — N186 End stage renal disease: Secondary | ICD-10-CM | POA: Diagnosis not present

## 2018-11-23 DIAGNOSIS — Z23 Encounter for immunization: Secondary | ICD-10-CM | POA: Diagnosis not present

## 2018-11-25 DIAGNOSIS — E1129 Type 2 diabetes mellitus with other diabetic kidney complication: Secondary | ICD-10-CM | POA: Diagnosis not present

## 2018-11-25 DIAGNOSIS — Z992 Dependence on renal dialysis: Secondary | ICD-10-CM | POA: Diagnosis not present

## 2018-11-25 DIAGNOSIS — Z23 Encounter for immunization: Secondary | ICD-10-CM | POA: Diagnosis not present

## 2018-11-25 DIAGNOSIS — N186 End stage renal disease: Secondary | ICD-10-CM | POA: Diagnosis not present

## 2018-11-25 DIAGNOSIS — N2581 Secondary hyperparathyroidism of renal origin: Secondary | ICD-10-CM | POA: Diagnosis not present

## 2018-11-25 DIAGNOSIS — D631 Anemia in chronic kidney disease: Secondary | ICD-10-CM | POA: Diagnosis not present

## 2018-11-30 DIAGNOSIS — N186 End stage renal disease: Secondary | ICD-10-CM | POA: Diagnosis not present

## 2018-11-30 DIAGNOSIS — Z23 Encounter for immunization: Secondary | ICD-10-CM | POA: Diagnosis not present

## 2018-11-30 DIAGNOSIS — D631 Anemia in chronic kidney disease: Secondary | ICD-10-CM | POA: Diagnosis not present

## 2018-11-30 DIAGNOSIS — N2581 Secondary hyperparathyroidism of renal origin: Secondary | ICD-10-CM | POA: Diagnosis not present

## 2018-11-30 DIAGNOSIS — E1129 Type 2 diabetes mellitus with other diabetic kidney complication: Secondary | ICD-10-CM | POA: Diagnosis not present

## 2018-11-30 DIAGNOSIS — Z992 Dependence on renal dialysis: Secondary | ICD-10-CM | POA: Diagnosis not present

## 2018-12-02 DIAGNOSIS — D631 Anemia in chronic kidney disease: Secondary | ICD-10-CM | POA: Diagnosis not present

## 2018-12-02 DIAGNOSIS — N2581 Secondary hyperparathyroidism of renal origin: Secondary | ICD-10-CM | POA: Diagnosis not present

## 2018-12-02 DIAGNOSIS — Z23 Encounter for immunization: Secondary | ICD-10-CM | POA: Diagnosis not present

## 2018-12-02 DIAGNOSIS — E1129 Type 2 diabetes mellitus with other diabetic kidney complication: Secondary | ICD-10-CM | POA: Diagnosis not present

## 2018-12-02 DIAGNOSIS — Z992 Dependence on renal dialysis: Secondary | ICD-10-CM | POA: Diagnosis not present

## 2018-12-02 DIAGNOSIS — N186 End stage renal disease: Secondary | ICD-10-CM | POA: Diagnosis not present

## 2018-12-05 DIAGNOSIS — Z23 Encounter for immunization: Secondary | ICD-10-CM | POA: Diagnosis not present

## 2018-12-05 DIAGNOSIS — N2581 Secondary hyperparathyroidism of renal origin: Secondary | ICD-10-CM | POA: Diagnosis not present

## 2018-12-05 DIAGNOSIS — D631 Anemia in chronic kidney disease: Secondary | ICD-10-CM | POA: Diagnosis not present

## 2018-12-05 DIAGNOSIS — Z992 Dependence on renal dialysis: Secondary | ICD-10-CM | POA: Diagnosis not present

## 2018-12-05 DIAGNOSIS — N186 End stage renal disease: Secondary | ICD-10-CM | POA: Diagnosis not present

## 2018-12-05 DIAGNOSIS — E1129 Type 2 diabetes mellitus with other diabetic kidney complication: Secondary | ICD-10-CM | POA: Diagnosis not present

## 2018-12-06 DIAGNOSIS — Z20828 Contact with and (suspected) exposure to other viral communicable diseases: Secondary | ICD-10-CM | POA: Diagnosis not present

## 2018-12-06 DIAGNOSIS — Z01812 Encounter for preprocedural laboratory examination: Secondary | ICD-10-CM | POA: Diagnosis not present

## 2018-12-07 DIAGNOSIS — Z992 Dependence on renal dialysis: Secondary | ICD-10-CM | POA: Diagnosis not present

## 2018-12-07 DIAGNOSIS — N2581 Secondary hyperparathyroidism of renal origin: Secondary | ICD-10-CM | POA: Diagnosis not present

## 2018-12-07 DIAGNOSIS — E1129 Type 2 diabetes mellitus with other diabetic kidney complication: Secondary | ICD-10-CM | POA: Diagnosis not present

## 2018-12-07 DIAGNOSIS — D631 Anemia in chronic kidney disease: Secondary | ICD-10-CM | POA: Diagnosis not present

## 2018-12-07 DIAGNOSIS — Z23 Encounter for immunization: Secondary | ICD-10-CM | POA: Diagnosis not present

## 2018-12-07 DIAGNOSIS — N186 End stage renal disease: Secondary | ICD-10-CM | POA: Diagnosis not present

## 2018-12-09 DIAGNOSIS — Z23 Encounter for immunization: Secondary | ICD-10-CM | POA: Diagnosis not present

## 2018-12-09 DIAGNOSIS — D631 Anemia in chronic kidney disease: Secondary | ICD-10-CM | POA: Diagnosis not present

## 2018-12-09 DIAGNOSIS — E1129 Type 2 diabetes mellitus with other diabetic kidney complication: Secondary | ICD-10-CM | POA: Diagnosis not present

## 2018-12-09 DIAGNOSIS — N2581 Secondary hyperparathyroidism of renal origin: Secondary | ICD-10-CM | POA: Diagnosis not present

## 2018-12-09 DIAGNOSIS — N186 End stage renal disease: Secondary | ICD-10-CM | POA: Diagnosis not present

## 2018-12-09 DIAGNOSIS — Z992 Dependence on renal dialysis: Secondary | ICD-10-CM | POA: Diagnosis not present

## 2018-12-12 DIAGNOSIS — Z23 Encounter for immunization: Secondary | ICD-10-CM | POA: Diagnosis not present

## 2018-12-12 DIAGNOSIS — N186 End stage renal disease: Secondary | ICD-10-CM | POA: Diagnosis not present

## 2018-12-12 DIAGNOSIS — N2581 Secondary hyperparathyroidism of renal origin: Secondary | ICD-10-CM | POA: Diagnosis not present

## 2018-12-12 DIAGNOSIS — D631 Anemia in chronic kidney disease: Secondary | ICD-10-CM | POA: Diagnosis not present

## 2018-12-12 DIAGNOSIS — E1129 Type 2 diabetes mellitus with other diabetic kidney complication: Secondary | ICD-10-CM | POA: Diagnosis not present

## 2018-12-12 DIAGNOSIS — Z992 Dependence on renal dialysis: Secondary | ICD-10-CM | POA: Diagnosis not present

## 2018-12-14 DIAGNOSIS — Z992 Dependence on renal dialysis: Secondary | ICD-10-CM | POA: Diagnosis not present

## 2018-12-14 DIAGNOSIS — E1129 Type 2 diabetes mellitus with other diabetic kidney complication: Secondary | ICD-10-CM | POA: Diagnosis not present

## 2018-12-14 DIAGNOSIS — N186 End stage renal disease: Secondary | ICD-10-CM | POA: Diagnosis not present

## 2018-12-14 DIAGNOSIS — Z23 Encounter for immunization: Secondary | ICD-10-CM | POA: Diagnosis not present

## 2018-12-14 DIAGNOSIS — D631 Anemia in chronic kidney disease: Secondary | ICD-10-CM | POA: Diagnosis not present

## 2018-12-14 DIAGNOSIS — N2581 Secondary hyperparathyroidism of renal origin: Secondary | ICD-10-CM | POA: Diagnosis not present

## 2018-12-16 DIAGNOSIS — E1129 Type 2 diabetes mellitus with other diabetic kidney complication: Secondary | ICD-10-CM | POA: Diagnosis not present

## 2018-12-16 DIAGNOSIS — Z23 Encounter for immunization: Secondary | ICD-10-CM | POA: Diagnosis not present

## 2018-12-16 DIAGNOSIS — D631 Anemia in chronic kidney disease: Secondary | ICD-10-CM | POA: Diagnosis not present

## 2018-12-16 DIAGNOSIS — Z992 Dependence on renal dialysis: Secondary | ICD-10-CM | POA: Diagnosis not present

## 2018-12-16 DIAGNOSIS — N2581 Secondary hyperparathyroidism of renal origin: Secondary | ICD-10-CM | POA: Diagnosis not present

## 2018-12-16 DIAGNOSIS — N186 End stage renal disease: Secondary | ICD-10-CM | POA: Diagnosis not present

## 2018-12-19 DIAGNOSIS — N186 End stage renal disease: Secondary | ICD-10-CM | POA: Diagnosis not present

## 2018-12-19 DIAGNOSIS — D631 Anemia in chronic kidney disease: Secondary | ICD-10-CM | POA: Diagnosis not present

## 2018-12-19 DIAGNOSIS — Z992 Dependence on renal dialysis: Secondary | ICD-10-CM | POA: Diagnosis not present

## 2018-12-19 DIAGNOSIS — Z23 Encounter for immunization: Secondary | ICD-10-CM | POA: Diagnosis not present

## 2018-12-19 DIAGNOSIS — E1129 Type 2 diabetes mellitus with other diabetic kidney complication: Secondary | ICD-10-CM | POA: Diagnosis not present

## 2018-12-19 DIAGNOSIS — N2581 Secondary hyperparathyroidism of renal origin: Secondary | ICD-10-CM | POA: Diagnosis not present

## 2018-12-21 DIAGNOSIS — D631 Anemia in chronic kidney disease: Secondary | ICD-10-CM | POA: Diagnosis not present

## 2018-12-21 DIAGNOSIS — Z992 Dependence on renal dialysis: Secondary | ICD-10-CM | POA: Diagnosis not present

## 2018-12-21 DIAGNOSIS — N2581 Secondary hyperparathyroidism of renal origin: Secondary | ICD-10-CM | POA: Diagnosis not present

## 2018-12-21 DIAGNOSIS — E1129 Type 2 diabetes mellitus with other diabetic kidney complication: Secondary | ICD-10-CM | POA: Diagnosis not present

## 2018-12-21 DIAGNOSIS — N186 End stage renal disease: Secondary | ICD-10-CM | POA: Diagnosis not present

## 2018-12-21 DIAGNOSIS — Z23 Encounter for immunization: Secondary | ICD-10-CM | POA: Diagnosis not present

## 2018-12-22 DIAGNOSIS — I129 Hypertensive chronic kidney disease with stage 1 through stage 4 chronic kidney disease, or unspecified chronic kidney disease: Secondary | ICD-10-CM | POA: Diagnosis not present

## 2018-12-22 DIAGNOSIS — N186 End stage renal disease: Secondary | ICD-10-CM | POA: Diagnosis not present

## 2018-12-22 DIAGNOSIS — Z992 Dependence on renal dialysis: Secondary | ICD-10-CM | POA: Diagnosis not present

## 2018-12-23 DIAGNOSIS — Z992 Dependence on renal dialysis: Secondary | ICD-10-CM | POA: Diagnosis not present

## 2018-12-23 DIAGNOSIS — E1129 Type 2 diabetes mellitus with other diabetic kidney complication: Secondary | ICD-10-CM | POA: Diagnosis not present

## 2018-12-23 DIAGNOSIS — D631 Anemia in chronic kidney disease: Secondary | ICD-10-CM | POA: Diagnosis not present

## 2018-12-23 DIAGNOSIS — N186 End stage renal disease: Secondary | ICD-10-CM | POA: Diagnosis not present

## 2018-12-23 DIAGNOSIS — N2581 Secondary hyperparathyroidism of renal origin: Secondary | ICD-10-CM | POA: Diagnosis not present

## 2018-12-26 DIAGNOSIS — D631 Anemia in chronic kidney disease: Secondary | ICD-10-CM | POA: Diagnosis not present

## 2018-12-26 DIAGNOSIS — E1129 Type 2 diabetes mellitus with other diabetic kidney complication: Secondary | ICD-10-CM | POA: Diagnosis not present

## 2018-12-26 DIAGNOSIS — N186 End stage renal disease: Secondary | ICD-10-CM | POA: Diagnosis not present

## 2018-12-26 DIAGNOSIS — Z992 Dependence on renal dialysis: Secondary | ICD-10-CM | POA: Diagnosis not present

## 2018-12-26 DIAGNOSIS — N2581 Secondary hyperparathyroidism of renal origin: Secondary | ICD-10-CM | POA: Diagnosis not present

## 2018-12-28 DIAGNOSIS — N186 End stage renal disease: Secondary | ICD-10-CM | POA: Diagnosis not present

## 2018-12-28 DIAGNOSIS — N2581 Secondary hyperparathyroidism of renal origin: Secondary | ICD-10-CM | POA: Diagnosis not present

## 2018-12-28 DIAGNOSIS — E1129 Type 2 diabetes mellitus with other diabetic kidney complication: Secondary | ICD-10-CM | POA: Diagnosis not present

## 2018-12-28 DIAGNOSIS — Z992 Dependence on renal dialysis: Secondary | ICD-10-CM | POA: Diagnosis not present

## 2018-12-28 DIAGNOSIS — D631 Anemia in chronic kidney disease: Secondary | ICD-10-CM | POA: Diagnosis not present

## 2019-01-02 DIAGNOSIS — Z992 Dependence on renal dialysis: Secondary | ICD-10-CM | POA: Diagnosis not present

## 2019-01-02 DIAGNOSIS — N186 End stage renal disease: Secondary | ICD-10-CM | POA: Diagnosis not present

## 2019-01-02 DIAGNOSIS — E1129 Type 2 diabetes mellitus with other diabetic kidney complication: Secondary | ICD-10-CM | POA: Diagnosis not present

## 2019-01-02 DIAGNOSIS — N2581 Secondary hyperparathyroidism of renal origin: Secondary | ICD-10-CM | POA: Diagnosis not present

## 2019-01-02 DIAGNOSIS — D631 Anemia in chronic kidney disease: Secondary | ICD-10-CM | POA: Diagnosis not present

## 2019-01-03 ENCOUNTER — Ambulatory Visit: Payer: Medicare Other | Admitting: Cardiovascular Disease

## 2019-01-04 DIAGNOSIS — Z992 Dependence on renal dialysis: Secondary | ICD-10-CM | POA: Diagnosis not present

## 2019-01-04 DIAGNOSIS — N2581 Secondary hyperparathyroidism of renal origin: Secondary | ICD-10-CM | POA: Diagnosis not present

## 2019-01-04 DIAGNOSIS — D631 Anemia in chronic kidney disease: Secondary | ICD-10-CM | POA: Diagnosis not present

## 2019-01-04 DIAGNOSIS — N186 End stage renal disease: Secondary | ICD-10-CM | POA: Diagnosis not present

## 2019-01-04 DIAGNOSIS — E1129 Type 2 diabetes mellitus with other diabetic kidney complication: Secondary | ICD-10-CM | POA: Diagnosis not present

## 2019-01-06 DIAGNOSIS — N2581 Secondary hyperparathyroidism of renal origin: Secondary | ICD-10-CM | POA: Diagnosis not present

## 2019-01-06 DIAGNOSIS — Z992 Dependence on renal dialysis: Secondary | ICD-10-CM | POA: Diagnosis not present

## 2019-01-06 DIAGNOSIS — E1129 Type 2 diabetes mellitus with other diabetic kidney complication: Secondary | ICD-10-CM | POA: Diagnosis not present

## 2019-01-06 DIAGNOSIS — D631 Anemia in chronic kidney disease: Secondary | ICD-10-CM | POA: Diagnosis not present

## 2019-01-06 DIAGNOSIS — N186 End stage renal disease: Secondary | ICD-10-CM | POA: Diagnosis not present

## 2019-01-09 DIAGNOSIS — N2581 Secondary hyperparathyroidism of renal origin: Secondary | ICD-10-CM | POA: Diagnosis not present

## 2019-01-09 DIAGNOSIS — Z992 Dependence on renal dialysis: Secondary | ICD-10-CM | POA: Diagnosis not present

## 2019-01-09 DIAGNOSIS — E1129 Type 2 diabetes mellitus with other diabetic kidney complication: Secondary | ICD-10-CM | POA: Diagnosis not present

## 2019-01-09 DIAGNOSIS — D631 Anemia in chronic kidney disease: Secondary | ICD-10-CM | POA: Diagnosis not present

## 2019-01-09 DIAGNOSIS — N186 End stage renal disease: Secondary | ICD-10-CM | POA: Diagnosis not present

## 2019-01-11 DIAGNOSIS — Z992 Dependence on renal dialysis: Secondary | ICD-10-CM | POA: Diagnosis not present

## 2019-01-11 DIAGNOSIS — D631 Anemia in chronic kidney disease: Secondary | ICD-10-CM | POA: Diagnosis not present

## 2019-01-11 DIAGNOSIS — N186 End stage renal disease: Secondary | ICD-10-CM | POA: Diagnosis not present

## 2019-01-11 DIAGNOSIS — E1129 Type 2 diabetes mellitus with other diabetic kidney complication: Secondary | ICD-10-CM | POA: Diagnosis not present

## 2019-01-11 DIAGNOSIS — N2581 Secondary hyperparathyroidism of renal origin: Secondary | ICD-10-CM | POA: Diagnosis not present

## 2019-01-13 DIAGNOSIS — Z992 Dependence on renal dialysis: Secondary | ICD-10-CM | POA: Diagnosis not present

## 2019-01-13 DIAGNOSIS — D631 Anemia in chronic kidney disease: Secondary | ICD-10-CM | POA: Diagnosis not present

## 2019-01-13 DIAGNOSIS — N2581 Secondary hyperparathyroidism of renal origin: Secondary | ICD-10-CM | POA: Diagnosis not present

## 2019-01-13 DIAGNOSIS — N186 End stage renal disease: Secondary | ICD-10-CM | POA: Diagnosis not present

## 2019-01-13 DIAGNOSIS — E1129 Type 2 diabetes mellitus with other diabetic kidney complication: Secondary | ICD-10-CM | POA: Diagnosis not present

## 2019-01-16 DIAGNOSIS — D631 Anemia in chronic kidney disease: Secondary | ICD-10-CM | POA: Diagnosis not present

## 2019-01-16 DIAGNOSIS — E1129 Type 2 diabetes mellitus with other diabetic kidney complication: Secondary | ICD-10-CM | POA: Diagnosis not present

## 2019-01-16 DIAGNOSIS — Z992 Dependence on renal dialysis: Secondary | ICD-10-CM | POA: Diagnosis not present

## 2019-01-16 DIAGNOSIS — N2581 Secondary hyperparathyroidism of renal origin: Secondary | ICD-10-CM | POA: Diagnosis not present

## 2019-01-16 DIAGNOSIS — N186 End stage renal disease: Secondary | ICD-10-CM | POA: Diagnosis not present

## 2019-01-18 DIAGNOSIS — D631 Anemia in chronic kidney disease: Secondary | ICD-10-CM | POA: Diagnosis not present

## 2019-01-18 DIAGNOSIS — Z992 Dependence on renal dialysis: Secondary | ICD-10-CM | POA: Diagnosis not present

## 2019-01-18 DIAGNOSIS — E1129 Type 2 diabetes mellitus with other diabetic kidney complication: Secondary | ICD-10-CM | POA: Diagnosis not present

## 2019-01-18 DIAGNOSIS — N2581 Secondary hyperparathyroidism of renal origin: Secondary | ICD-10-CM | POA: Diagnosis not present

## 2019-01-18 DIAGNOSIS — N186 End stage renal disease: Secondary | ICD-10-CM | POA: Diagnosis not present

## 2019-01-22 DIAGNOSIS — Z992 Dependence on renal dialysis: Secondary | ICD-10-CM | POA: Diagnosis not present

## 2019-01-22 DIAGNOSIS — N186 End stage renal disease: Secondary | ICD-10-CM | POA: Diagnosis not present

## 2019-01-22 DIAGNOSIS — I129 Hypertensive chronic kidney disease with stage 1 through stage 4 chronic kidney disease, or unspecified chronic kidney disease: Secondary | ICD-10-CM | POA: Diagnosis not present

## 2019-01-23 DIAGNOSIS — N186 End stage renal disease: Secondary | ICD-10-CM | POA: Diagnosis not present

## 2019-01-23 DIAGNOSIS — N2581 Secondary hyperparathyroidism of renal origin: Secondary | ICD-10-CM | POA: Diagnosis not present

## 2019-01-23 DIAGNOSIS — D631 Anemia in chronic kidney disease: Secondary | ICD-10-CM | POA: Diagnosis not present

## 2019-01-23 DIAGNOSIS — E1129 Type 2 diabetes mellitus with other diabetic kidney complication: Secondary | ICD-10-CM | POA: Diagnosis not present

## 2019-01-23 DIAGNOSIS — Z992 Dependence on renal dialysis: Secondary | ICD-10-CM | POA: Diagnosis not present

## 2019-01-25 DIAGNOSIS — N2581 Secondary hyperparathyroidism of renal origin: Secondary | ICD-10-CM | POA: Diagnosis not present

## 2019-01-25 DIAGNOSIS — Z992 Dependence on renal dialysis: Secondary | ICD-10-CM | POA: Diagnosis not present

## 2019-01-25 DIAGNOSIS — D631 Anemia in chronic kidney disease: Secondary | ICD-10-CM | POA: Diagnosis not present

## 2019-01-25 DIAGNOSIS — E1129 Type 2 diabetes mellitus with other diabetic kidney complication: Secondary | ICD-10-CM | POA: Diagnosis not present

## 2019-01-25 DIAGNOSIS — N186 End stage renal disease: Secondary | ICD-10-CM | POA: Diagnosis not present

## 2019-01-27 DIAGNOSIS — Z992 Dependence on renal dialysis: Secondary | ICD-10-CM | POA: Diagnosis not present

## 2019-01-27 DIAGNOSIS — N2581 Secondary hyperparathyroidism of renal origin: Secondary | ICD-10-CM | POA: Diagnosis not present

## 2019-01-27 DIAGNOSIS — N186 End stage renal disease: Secondary | ICD-10-CM | POA: Diagnosis not present

## 2019-01-27 DIAGNOSIS — D631 Anemia in chronic kidney disease: Secondary | ICD-10-CM | POA: Diagnosis not present

## 2019-01-27 DIAGNOSIS — E1129 Type 2 diabetes mellitus with other diabetic kidney complication: Secondary | ICD-10-CM | POA: Diagnosis not present

## 2019-01-30 DIAGNOSIS — Z992 Dependence on renal dialysis: Secondary | ICD-10-CM | POA: Diagnosis not present

## 2019-01-30 DIAGNOSIS — N2581 Secondary hyperparathyroidism of renal origin: Secondary | ICD-10-CM | POA: Diagnosis not present

## 2019-01-30 DIAGNOSIS — N186 End stage renal disease: Secondary | ICD-10-CM | POA: Diagnosis not present

## 2019-01-30 DIAGNOSIS — E1129 Type 2 diabetes mellitus with other diabetic kidney complication: Secondary | ICD-10-CM | POA: Diagnosis not present

## 2019-01-30 DIAGNOSIS — D631 Anemia in chronic kidney disease: Secondary | ICD-10-CM | POA: Diagnosis not present

## 2019-02-01 DIAGNOSIS — N186 End stage renal disease: Secondary | ICD-10-CM | POA: Diagnosis not present

## 2019-02-01 DIAGNOSIS — N2581 Secondary hyperparathyroidism of renal origin: Secondary | ICD-10-CM | POA: Diagnosis not present

## 2019-02-01 DIAGNOSIS — Z992 Dependence on renal dialysis: Secondary | ICD-10-CM | POA: Diagnosis not present

## 2019-02-01 DIAGNOSIS — E1129 Type 2 diabetes mellitus with other diabetic kidney complication: Secondary | ICD-10-CM | POA: Diagnosis not present

## 2019-02-01 DIAGNOSIS — D631 Anemia in chronic kidney disease: Secondary | ICD-10-CM | POA: Diagnosis not present

## 2019-02-03 DIAGNOSIS — N2581 Secondary hyperparathyroidism of renal origin: Secondary | ICD-10-CM | POA: Diagnosis not present

## 2019-02-03 DIAGNOSIS — Z992 Dependence on renal dialysis: Secondary | ICD-10-CM | POA: Diagnosis not present

## 2019-02-03 DIAGNOSIS — N186 End stage renal disease: Secondary | ICD-10-CM | POA: Diagnosis not present

## 2019-02-03 DIAGNOSIS — E1129 Type 2 diabetes mellitus with other diabetic kidney complication: Secondary | ICD-10-CM | POA: Diagnosis not present

## 2019-02-03 DIAGNOSIS — D631 Anemia in chronic kidney disease: Secondary | ICD-10-CM | POA: Diagnosis not present

## 2019-02-06 DIAGNOSIS — N186 End stage renal disease: Secondary | ICD-10-CM | POA: Diagnosis not present

## 2019-02-06 DIAGNOSIS — Z992 Dependence on renal dialysis: Secondary | ICD-10-CM | POA: Diagnosis not present

## 2019-02-06 DIAGNOSIS — E1129 Type 2 diabetes mellitus with other diabetic kidney complication: Secondary | ICD-10-CM | POA: Diagnosis not present

## 2019-02-06 DIAGNOSIS — D631 Anemia in chronic kidney disease: Secondary | ICD-10-CM | POA: Diagnosis not present

## 2019-02-06 DIAGNOSIS — N2581 Secondary hyperparathyroidism of renal origin: Secondary | ICD-10-CM | POA: Diagnosis not present

## 2019-02-08 DIAGNOSIS — N2581 Secondary hyperparathyroidism of renal origin: Secondary | ICD-10-CM | POA: Diagnosis not present

## 2019-02-08 DIAGNOSIS — N186 End stage renal disease: Secondary | ICD-10-CM | POA: Diagnosis not present

## 2019-02-08 DIAGNOSIS — E1129 Type 2 diabetes mellitus with other diabetic kidney complication: Secondary | ICD-10-CM | POA: Diagnosis not present

## 2019-02-08 DIAGNOSIS — D631 Anemia in chronic kidney disease: Secondary | ICD-10-CM | POA: Diagnosis not present

## 2019-02-08 DIAGNOSIS — Z992 Dependence on renal dialysis: Secondary | ICD-10-CM | POA: Diagnosis not present

## 2019-02-10 DIAGNOSIS — E1129 Type 2 diabetes mellitus with other diabetic kidney complication: Secondary | ICD-10-CM | POA: Diagnosis not present

## 2019-02-10 DIAGNOSIS — Z992 Dependence on renal dialysis: Secondary | ICD-10-CM | POA: Diagnosis not present

## 2019-02-10 DIAGNOSIS — N2581 Secondary hyperparathyroidism of renal origin: Secondary | ICD-10-CM | POA: Diagnosis not present

## 2019-02-10 DIAGNOSIS — D631 Anemia in chronic kidney disease: Secondary | ICD-10-CM | POA: Diagnosis not present

## 2019-02-10 DIAGNOSIS — N186 End stage renal disease: Secondary | ICD-10-CM | POA: Diagnosis not present

## 2019-02-12 DIAGNOSIS — N186 End stage renal disease: Secondary | ICD-10-CM | POA: Diagnosis not present

## 2019-02-12 DIAGNOSIS — D631 Anemia in chronic kidney disease: Secondary | ICD-10-CM | POA: Diagnosis not present

## 2019-02-12 DIAGNOSIS — N2581 Secondary hyperparathyroidism of renal origin: Secondary | ICD-10-CM | POA: Diagnosis not present

## 2019-02-12 DIAGNOSIS — E1129 Type 2 diabetes mellitus with other diabetic kidney complication: Secondary | ICD-10-CM | POA: Diagnosis not present

## 2019-02-12 DIAGNOSIS — Z992 Dependence on renal dialysis: Secondary | ICD-10-CM | POA: Diagnosis not present

## 2019-02-14 DIAGNOSIS — E1129 Type 2 diabetes mellitus with other diabetic kidney complication: Secondary | ICD-10-CM | POA: Diagnosis not present

## 2019-02-14 DIAGNOSIS — Z992 Dependence on renal dialysis: Secondary | ICD-10-CM | POA: Diagnosis not present

## 2019-02-14 DIAGNOSIS — N186 End stage renal disease: Secondary | ICD-10-CM | POA: Diagnosis not present

## 2019-02-14 DIAGNOSIS — D631 Anemia in chronic kidney disease: Secondary | ICD-10-CM | POA: Diagnosis not present

## 2019-02-14 DIAGNOSIS — N2581 Secondary hyperparathyroidism of renal origin: Secondary | ICD-10-CM | POA: Diagnosis not present

## 2019-02-17 DIAGNOSIS — D631 Anemia in chronic kidney disease: Secondary | ICD-10-CM | POA: Diagnosis not present

## 2019-02-17 DIAGNOSIS — N2581 Secondary hyperparathyroidism of renal origin: Secondary | ICD-10-CM | POA: Diagnosis not present

## 2019-02-17 DIAGNOSIS — Z992 Dependence on renal dialysis: Secondary | ICD-10-CM | POA: Diagnosis not present

## 2019-02-17 DIAGNOSIS — E1129 Type 2 diabetes mellitus with other diabetic kidney complication: Secondary | ICD-10-CM | POA: Diagnosis not present

## 2019-02-17 DIAGNOSIS — N186 End stage renal disease: Secondary | ICD-10-CM | POA: Diagnosis not present

## 2019-02-20 DIAGNOSIS — D631 Anemia in chronic kidney disease: Secondary | ICD-10-CM | POA: Diagnosis not present

## 2019-02-20 DIAGNOSIS — N186 End stage renal disease: Secondary | ICD-10-CM | POA: Diagnosis not present

## 2019-02-20 DIAGNOSIS — Z992 Dependence on renal dialysis: Secondary | ICD-10-CM | POA: Diagnosis not present

## 2019-02-20 DIAGNOSIS — N2581 Secondary hyperparathyroidism of renal origin: Secondary | ICD-10-CM | POA: Diagnosis not present

## 2019-02-20 DIAGNOSIS — E1129 Type 2 diabetes mellitus with other diabetic kidney complication: Secondary | ICD-10-CM | POA: Diagnosis not present

## 2019-02-21 ENCOUNTER — Other Ambulatory Visit: Payer: Self-pay

## 2019-03-10 ENCOUNTER — Other Ambulatory Visit: Payer: Self-pay | Admitting: Family Medicine

## 2019-03-10 DIAGNOSIS — E1169 Type 2 diabetes mellitus with other specified complication: Secondary | ICD-10-CM

## 2019-03-20 ENCOUNTER — Telehealth: Payer: Self-pay | Admitting: Family Medicine

## 2019-03-20 DIAGNOSIS — I10 Essential (primary) hypertension: Secondary | ICD-10-CM

## 2019-03-20 MED ORDER — CARVEDILOL 25 MG PO TABS: 25.0000 mg | ORAL_TABLET | Freq: Two times a day (BID) | ORAL | 1 refills | Status: DC

## 2019-03-20 MED ORDER — AMLODIPINE BESYLATE 10 MG PO TABS: ORAL_TABLET | ORAL | 1 refills | Status: DC

## 2019-03-20 NOTE — Telephone Encounter (Signed)
1) Medication(s) Requested (by name):amLODipine (NORVASC) 10 MG tablet [id number]  carvedilol (COREG) 25 MG tablet [id number]   2) Pharmacy of Choice:[id number] - [place], [place]  [id number], [place] [address]   3) Special Requests:   Approved medications will be sent to the pharmacy, we will reach out if there is an issue.  Requests made after 3pm may not be addressed until the following business day!  If a patient is unsure of the name of the medication(s) please note and ask patient to call back when they are able to provide all info, do not send to responsible party until all information is available!

## 2019-03-20 NOTE — Telephone Encounter (Signed)
Rx sent 

## 2019-03-24 DIAGNOSIS — N186 End stage renal disease: Secondary | ICD-10-CM | POA: Diagnosis not present

## 2019-03-24 DIAGNOSIS — Z992 Dependence on renal dialysis: Secondary | ICD-10-CM | POA: Diagnosis not present

## 2019-03-24 DIAGNOSIS — Z940 Kidney transplant status: Secondary | ICD-10-CM

## 2019-03-24 DIAGNOSIS — I129 Hypertensive chronic kidney disease with stage 1 through stage 4 chronic kidney disease, or unspecified chronic kidney disease: Secondary | ICD-10-CM | POA: Diagnosis not present

## 2019-03-24 HISTORY — DX: Kidney transplant status: Z94.0

## 2019-03-24 HISTORY — PX: OTHER SURGICAL HISTORY: SHX169

## 2019-03-27 DIAGNOSIS — N2581 Secondary hyperparathyroidism of renal origin: Secondary | ICD-10-CM | POA: Diagnosis not present

## 2019-03-27 DIAGNOSIS — E1129 Type 2 diabetes mellitus with other diabetic kidney complication: Secondary | ICD-10-CM | POA: Diagnosis not present

## 2019-03-27 DIAGNOSIS — N186 End stage renal disease: Secondary | ICD-10-CM | POA: Diagnosis not present

## 2019-03-27 DIAGNOSIS — Z992 Dependence on renal dialysis: Secondary | ICD-10-CM | POA: Diagnosis not present

## 2019-03-27 DIAGNOSIS — D631 Anemia in chronic kidney disease: Secondary | ICD-10-CM | POA: Diagnosis not present

## 2019-03-29 DIAGNOSIS — N186 End stage renal disease: Secondary | ICD-10-CM | POA: Diagnosis not present

## 2019-03-29 DIAGNOSIS — N2581 Secondary hyperparathyroidism of renal origin: Secondary | ICD-10-CM | POA: Diagnosis not present

## 2019-03-29 DIAGNOSIS — D631 Anemia in chronic kidney disease: Secondary | ICD-10-CM | POA: Diagnosis not present

## 2019-03-29 DIAGNOSIS — E1129 Type 2 diabetes mellitus with other diabetic kidney complication: Secondary | ICD-10-CM | POA: Diagnosis not present

## 2019-03-29 DIAGNOSIS — Z992 Dependence on renal dialysis: Secondary | ICD-10-CM | POA: Diagnosis not present

## 2019-03-30 ENCOUNTER — Other Ambulatory Visit: Payer: Self-pay | Admitting: Family Medicine

## 2019-03-30 DIAGNOSIS — K2100 Gastro-esophageal reflux disease with esophagitis, without bleeding: Secondary | ICD-10-CM

## 2019-03-31 DIAGNOSIS — Z992 Dependence on renal dialysis: Secondary | ICD-10-CM | POA: Diagnosis not present

## 2019-03-31 DIAGNOSIS — N2581 Secondary hyperparathyroidism of renal origin: Secondary | ICD-10-CM | POA: Diagnosis not present

## 2019-03-31 DIAGNOSIS — N186 End stage renal disease: Secondary | ICD-10-CM | POA: Diagnosis not present

## 2019-03-31 DIAGNOSIS — D631 Anemia in chronic kidney disease: Secondary | ICD-10-CM | POA: Diagnosis not present

## 2019-03-31 DIAGNOSIS — E1129 Type 2 diabetes mellitus with other diabetic kidney complication: Secondary | ICD-10-CM | POA: Diagnosis not present

## 2019-04-03 DIAGNOSIS — E1129 Type 2 diabetes mellitus with other diabetic kidney complication: Secondary | ICD-10-CM | POA: Diagnosis not present

## 2019-04-03 DIAGNOSIS — D631 Anemia in chronic kidney disease: Secondary | ICD-10-CM | POA: Diagnosis not present

## 2019-04-03 DIAGNOSIS — N186 End stage renal disease: Secondary | ICD-10-CM | POA: Diagnosis not present

## 2019-04-03 DIAGNOSIS — N2581 Secondary hyperparathyroidism of renal origin: Secondary | ICD-10-CM | POA: Diagnosis not present

## 2019-04-03 DIAGNOSIS — Z992 Dependence on renal dialysis: Secondary | ICD-10-CM | POA: Diagnosis not present

## 2019-04-04 ENCOUNTER — Ambulatory Visit: Payer: Medicare Other

## 2019-04-05 DIAGNOSIS — E1129 Type 2 diabetes mellitus with other diabetic kidney complication: Secondary | ICD-10-CM | POA: Diagnosis not present

## 2019-04-05 DIAGNOSIS — Z992 Dependence on renal dialysis: Secondary | ICD-10-CM | POA: Diagnosis not present

## 2019-04-05 DIAGNOSIS — N2581 Secondary hyperparathyroidism of renal origin: Secondary | ICD-10-CM | POA: Diagnosis not present

## 2019-04-05 DIAGNOSIS — D631 Anemia in chronic kidney disease: Secondary | ICD-10-CM | POA: Diagnosis not present

## 2019-04-05 DIAGNOSIS — N186 End stage renal disease: Secondary | ICD-10-CM | POA: Diagnosis not present

## 2019-04-07 DIAGNOSIS — D631 Anemia in chronic kidney disease: Secondary | ICD-10-CM | POA: Diagnosis not present

## 2019-04-07 DIAGNOSIS — Z992 Dependence on renal dialysis: Secondary | ICD-10-CM | POA: Diagnosis not present

## 2019-04-07 DIAGNOSIS — N2581 Secondary hyperparathyroidism of renal origin: Secondary | ICD-10-CM | POA: Diagnosis not present

## 2019-04-07 DIAGNOSIS — E1129 Type 2 diabetes mellitus with other diabetic kidney complication: Secondary | ICD-10-CM | POA: Diagnosis not present

## 2019-04-07 DIAGNOSIS — N186 End stage renal disease: Secondary | ICD-10-CM | POA: Diagnosis not present

## 2019-04-09 ENCOUNTER — Other Ambulatory Visit: Payer: Self-pay | Admitting: Family Medicine

## 2019-04-09 DIAGNOSIS — E1169 Type 2 diabetes mellitus with other specified complication: Secondary | ICD-10-CM

## 2019-04-10 DIAGNOSIS — N186 End stage renal disease: Secondary | ICD-10-CM | POA: Diagnosis not present

## 2019-04-10 DIAGNOSIS — N2581 Secondary hyperparathyroidism of renal origin: Secondary | ICD-10-CM | POA: Diagnosis not present

## 2019-04-10 DIAGNOSIS — E1129 Type 2 diabetes mellitus with other diabetic kidney complication: Secondary | ICD-10-CM | POA: Diagnosis not present

## 2019-04-10 DIAGNOSIS — D631 Anemia in chronic kidney disease: Secondary | ICD-10-CM | POA: Diagnosis not present

## 2019-04-10 DIAGNOSIS — Z992 Dependence on renal dialysis: Secondary | ICD-10-CM | POA: Diagnosis not present

## 2019-04-11 ENCOUNTER — Ambulatory Visit: Payer: Medicare Other | Admitting: Family Medicine

## 2019-04-12 DIAGNOSIS — D631 Anemia in chronic kidney disease: Secondary | ICD-10-CM | POA: Diagnosis not present

## 2019-04-12 DIAGNOSIS — N186 End stage renal disease: Secondary | ICD-10-CM | POA: Diagnosis not present

## 2019-04-12 DIAGNOSIS — N2581 Secondary hyperparathyroidism of renal origin: Secondary | ICD-10-CM | POA: Diagnosis not present

## 2019-04-12 DIAGNOSIS — Z992 Dependence on renal dialysis: Secondary | ICD-10-CM | POA: Diagnosis not present

## 2019-04-12 DIAGNOSIS — E1129 Type 2 diabetes mellitus with other diabetic kidney complication: Secondary | ICD-10-CM | POA: Diagnosis not present

## 2019-04-12 NOTE — H&P (View-Only) (Signed)
History of Present Illness:  Patient is a 64 y.o. year old female who presents for evaluation of malfunctioning AV fistula.  Previous fistula intervention includes: primary repair and resection of the pseudoaneurysm.  Multiple AV fistula venograms with angioplasty for venous outflow narrowing.    PROCEDURE: 1. Leftarteriovenous graftcannulation under ultrasound guidance 2. Leftarm fistulogramincluding central venogram 3. Central venoplasty - left innominate vein (10 mm x 40 mm Mustang and 12 mm x 40 mm drug coated Lutonix) Peripheral venoplasty - venous outflow tract of left upper arm graft (8 mm x 60 mm Mustang)  She is experiencing low flow rates again at HD and is concerned about the central venous stenosis.  She denise pain, loss of sensation or loss of motor.  Past Medical History:  Diagnosis Date  . Anemia   . Asthma   . ESRD (end stage renal disease) ([place])    MWF  . GERD (gastroesophageal reflux disease)   . Gout   . 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 Heavy menstrual bleeding   . High cholesterol   . History of blood transfusion 02/2016- last one   1990's- several ones   . History of hiatal hernia   . History of kidney stones 1980's  . Hyperlipemia 12/07/2012  . Hypertension   . Hypothyroidism   . Multiple gastric ulcers   . Pneumonia 2012  . Pre-diabetes   . Prediabetes   . RA (rheumatoid arthritis) ([place])   . Sleep apnea    [place]- have CPAP, not able to use every night     Past Surgical History:  Procedure Laterality Date  . A/V SHUNTOGRAM N/A 09/08/2016   Procedure: A/V Shuntogram - Left Arm AV Graft;  Surgeon: Serafina Mitchell, MD;  Location: [place] [facility];  Service: Cardiovascular;  Laterality: N/A;  . A/V SHUNTOGRAM N/A 09/02/2017   Procedure: A/V SHUNTOGRAM - Left Arm AVG;  Surgeon: Conrad Bamberg, MD;  Location: [place] [facility];  Service: Cardiovascular;  Laterality: N/A;  . A/V SHUNTOGRAM N/A 12/21/2017   Procedure:  A/V SHUNTOGRAM - left arm;  Surgeon: Serafina Mitchell, MD;  Location: [place] [facility];  Service: Cardiovascular;  Laterality: N/A;  . A/V SHUNTOGRAM Left 10/05/2018   Procedure: A/V SHUNTOGRAM;  Surgeon: Marty Heck, MD;  Location: [place] [facility];  Service: Cardiovascular;  Laterality: Left;  . ABDOMINAL HYSTERECTOMY  2000  . AV FISTULA PLACEMENT Left 05/25/2016   Procedure: INSERTION OF LEFT UPPER ARM ARTERIOVENOUS (AV) LOOP GORE-TEX GRAFT ARM;  Surgeon: Elam Dutch, MD;  Location: [place];  Service: Vascular;  Laterality: Left;  . BASCILIC VEIN TRANSPOSITION Right 12/06/2014   Procedure: FIRST STAGE BASILIC VEIN TRANSPOSITION - RIGHT;  Surgeon: Serafina Mitchell, MD;  Location: [place];  Service: Vascular;  Laterality: Right;  . BASCILIC VEIN TRANSPOSITION Right 02/07/2015   Procedure: RIGHT ARM SECOND STAGE BASILIC VEIN TRANSPOSITION;  Surgeon: Serafina Mitchell, MD;  Location: [place];  Service: Vascular;  Laterality: Right;  . BASCILIC VEIN TRANSPOSITION Left 04/22/2016   Procedure: FIRST STAGE BASILIC VEIN TRANSPOSITION LEFT ARM;  Surgeon: Conrad [place], MD;  Location: [place];  Service: Vascular;  Laterality: Left;  . COLONOSCOPY W/ POLYPECTOMY    . ECTOPIC PREGNANCY SURGERY  02/1982  . EXCHANGE OF A DIALYSIS CATHETER Right 04/22/2016   Procedure: EXCHANGE OF A DIALYSIS CATHETER - INSERTION RIGHT INTERNAL JUGULAR & REMOVAL FROM LEFT INTERNAL JUGULAR;  Surgeon: Conrad [place], MD;  Location: [place];  Service: Vascular;  Laterality: Right;  . I & D EXTREMITY Right 02/07/2015   Procedure: IRRIGATION AND DEBRIDEMENT RIGHT ARM HEMATOMA;  Surgeon: Serafina Mitchell, MD;  Location: [place];  Service: Vascular;  Laterality: Right;  . INSERTION OF DIALYSIS CATHETER  03/03/2016   Procedure: INSERTION OF DIALYSIS CATHETER;  Surgeon: Elam Dutch, MD;  Location: [place];  Service: Vascular;;  . IR AV DIALY SHUNT INTRO NEEDLE/INTRACATH INITIAL W/PTA/IMG LEFT  04/20/2017  . IR AV DIALY SHUNT INTRO  NEEDLE/INTRACATH INITIAL W/PTA/IMG LEFT  06/24/2017  . IR THROMBECTOMY AV FISTULA W/THROMBOLYSIS/PTA INC/SHUNT/IMG LEFT Left 01/04/2017  . IR US GUIDE VASC ACCESS LEFT  01/04/2017  . left shoulder surgery  08/2005  . LIGATION OF ARTERIOVENOUS  FISTULA  03/03/2016   Procedure: LIGATION OF ARTERIOVENOUS  FISTULA;  Surgeon: Elam Dutch, MD;  Location: [facility];  Service: Vascular;;  . LIGATION OF ARTERIOVENOUS  FISTULA Right 09/06/2018   Procedure: Resection of pseudoaneursym and end to end repair of right brachial artery;  Surgeon: Elam Dutch, MD;  Location: [facility] OR;  Service: Vascular;  Laterality: Right;  . PERIPHERAL VASCULAR BALLOON ANGIOPLASTY  09/08/2016   Procedure: Peripheral Vascular Balloon Angioplasty;  Surgeon: Serafina Mitchell, MD;  Location: [place] [facility];  Service: Cardiovascular;;  left avf  . PERIPHERAL VASCULAR BALLOON ANGIOPLASTY  09/02/2017   Procedure: PERIPHERAL VASCULAR BALLOON ANGIOPLASTY;  Surgeon: Conrad [place], MD;  Location: [place] [facility];  Service: Cardiovascular;;  left AV Graft  . PERIPHERAL VASCULAR BALLOON ANGIOPLASTY  12/21/2017   Procedure: PERIPHERAL VASCULAR BALLOON ANGIOPLASTY;  Surgeon: Serafina Mitchell, MD;  Location: [place] [facility];  Service: Cardiovascular;;  INOMINATE / UPPER ARM AV GRAFT  . PERIPHERAL VASCULAR BALLOON ANGIOPLASTY Left 05/12/2018   Procedure: PERIPHERAL VASCULAR BALLOON ANGIOPLASTY;  Surgeon: Marty Heck, MD;  Location: [place] [facility];  Service: Cardiovascular;  Laterality: Left;  Arm fistula  . PERIPHERAL VASCULAR BALLOON ANGIOPLASTY Left 10/05/2018   Procedure: PERIPHERAL VASCULAR BALLOON ANGIOPLASTY;  Surgeon: Marty Heck, MD;  Location: [place] [facility];  Service: Cardiovascular;  Laterality: Left;  central and peripheral vein  . TEE WITHOUT CARDIOVERSION N/A 03/06/2016   Procedure: TRANSESOPHAGEAL ECHOCARDIOGRAM (TEE);  Surgeon: Sueanne Margarita, MD;  Location: [facility] END[facility]COPY;  Service: Cardiovascular;   Laterality: N/A;  . TUBAL LIGATION  11/1986     Social History Social History   Tobacco Use  . Smoking status: Never Smoker  . Smokeless tobacco: Never Used  Substance Use Topics  . Alcohol use: Yes    Alcohol/week: 0.0 standard drinks    Comment: occ - 1 drink rarely  . Drug use: No    Family History Family History  Problem Relation Age of Onset  . Hypertension Mother   . Breast cancer Maternal Aunt   . Deep vein thrombosis Daughter   . Hyperlipidemia Daughter   . Hypertension Daughter   . Prostate cancer Maternal Grandmother     Allergies  Allergies  Allergen Reactions  . Atacand [Candesartan] Anaphylaxis and Other (See Comments)    Swelling of the mouth and tongue.  Marland Kitchen Lisinopril Anaphylaxis and Other (See Comments)    Swelling of the mouth and tongue.  . Nsaids Anaphylaxis and Other (See Comments)    Swelling of the mouth and tongue.  Marland Kitchen Penicillins Rash and Other (See Comments)    Has patient had a PCN reaction causing immediate rash, facial/tongue/throat  swelling, SOB or lightheadedness with hypotension: No Has patient had a PCN reaction causing severe rash involving mucus membranes or skin necrosis: No Has patient had a PCN reaction that required hospitalization No Has patient had a PCN reaction occurring within the last 10 years: No If all of the above answers are "NO", then may proceed with Cephalosporin use.      Current Outpatient Medications  Medication Sig Dispense Refill  . acetaminophen (TYLENOL) 650 MG CR tablet Take 650-1,300 mg by mouth every 8 (eight) hours as needed for pain.    Marland Kitchen albuterol (PROVENTIL) (2.5 MG/3ML) 0.083% nebulizer solution Take 3 mLs (2.5 mg total) by nebulization every 6 (six) hours as needed for wheezing or shortness of breath. ICD 10:J45.20 75 mL 12  . allopurinol (ZYLOPRIM) 100 MG tablet Take 1 tablet (100 mg total) by mouth 2 (two) times daily. 60 tablet 6  . amLODipine (NORVASC) 10 MG tablet TAKE 1 TABLET BY MOUTH EVERYDAY  AT BEDTIME 30 tablet 1  . atorvastatin (LIPITOR) 80 MG tablet Take 1 tablet (80 mg total) by mouth daily. Must have office visit for refills. 30 tablet 0  . budesonide-formoterol (SYMBICORT) 160-4.5 MCG/ACT inhaler Inhale 2 puffs into the lungs 2 (two) times daily for 1 day. 1 Inhaler 6  . carvedilol (COREG) 25 MG tablet Take 1 tablet (25 mg total) by mouth 2 (two) times daily with a meal. 60 tablet 1  . chlorpheniramine (CHLOR-TRIMETON) 4 MG tablet Take 1 tablet (4 mg total) by mouth 3 (three) times daily. (Patient taking differently: Take 8 mg by mouth daily at 12 noon. ) 190 tablet 0  . clotrimazole (LOTRIMIN) 1 % cream Apply 1 application topically 2 (two) times daily. (Patient taking differently: Apply 1 application topically 2 (two) times daily as needed (skin irritation.). ) 30 g 1  . cyclobenzaprine (FLEXERIL) 10 MG tablet Take 1 tablet (10 mg total) by mouth 2 (two) times daily as needed. For back pain 60 tablet 2  . famotidine (PEPCID) 20 MG tablet Take 1 tablet (20 mg total) by mouth 2 (two) times daily. 180 tablet 1  . fluticasone (FLONASE) 50 MCG/ACT nasal spray SPRAY 2 SPRAYS INTO EACH NOSTRIL EVERY DAY 16 mL 2  . gabapentin (NEURONTIN) 300 MG capsule TAKE 1 CAPSULE BY MOUTH TWICE A DAY (Patient taking differently: Take 300 mg by mouth 2 (two) times daily as needed (pain.). ) 180 capsule 1  . hydrALAZINE (APRESOLINE) 25 MG tablet Take 12.5 mg by mouth 3 (three) times daily.     . meclizine (ANTIVERT) 25 MG tablet Take 25 mg by mouth 3 (three) times daily as needed for dizziness.    . multivitamin (RENA-VIT) TABS tablet Take 1 tablet by mouth daily at 12 noon.     Marland Kitchen oxyCODONE-acetaminophen (PERCOCET) 5-325 MG tablet Take 1 tablet by mouth every 6 (six) hours as needed for severe pain. 12 tablet 0  . pantoprazole (PROTONIX) 40 MG tablet TAKE 1 TABLET BY MOUTH EVERY DAY 90 tablet 1  . RENVELA 800 MG tablet Take 800-2,400 mg by mouth See admin instructions. Take 3 tablets (2400 mg) by mouth  with each meal & take 1 tablet (800 mg) by mouth with each snack  11  . VENTOLIN HFA 108 (90 Base) MCG/ACT inhaler INHALE 2 PUFFS BY MOUTH EVERY 6 HOURS AS NEEDED FOR WHEEZE OR SHORTNESS OF BREATH (Patient taking differently: Inhale 2 puffs into the lungs every 6 (six) hours as needed for wheezing or shortness of breath. )  18 Inhaler 1   No current facility-administered medications for this visit.    ROS:   General:  No weight loss, Fever, chills  HEENT: No recent headaches, no nasal bleeding, no visual changes, no sore throat  Neurologic: No dizziness, blackouts, seizures. No recent symptoms of stroke or mini- stroke. No recent episodes of slurred speech, or temporary blindness.  Cardiac: No recent episodes of chest pain/pressure, no shortness of breath at rest.  No shortness of breath with exertion.  Denies history of atrial fibrillation or irregular heartbeat  Vascular: No history of rest pain in feet.  No history of claudication.  No history of non-healing ulcer, No history of DVT   Pulmonary: No home oxygen, no productive cough, no hemoptysis,  No asthma or wheezing  Musculoskeletal:  [ ]  Arthritis, [ ]  Low back pain,  [ ]  Joint pain  Hematologic:No history of hypercoagulable state.  No history of easy bleeding.  No history of anemia  Gastrointestinal: No hematochezia or melena,  No gastroesophageal reflux, no trouble swallowing  Urinary: [ ]  chronic Kidney disease, [x ] on HD - [x ] MWF or [ ]  TTHS, [ ]  Burning with urination, [ ]  Frequent urination, [ ]  Difficulty urinating;   Skin: No rashes  Psychological: No history of anxiety,  No history of depression   Physical Examination  Vitals:   04/13/19 1335  BP: (!) 157/94  Pulse: 80  Resp: 16  Temp: (!) 97.3 F (36.3 C)  TempSrc: Temporal  SpO2: 97%  Weight: 164 lb (74.4 kg)  Height: 5\' 2"  (1.575 m)    Body mass index is 30 kg/m.  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  She is in agreement.      Roxy Horseman PA-C [facility] [place] Office: [phone]  MD in clinic [place]

## 2019-04-12 NOTE — Progress Notes (Signed)
History of Present Illness:  Patient is a 64 y.o. year old female who presents for evaluation of malfunctioning AV fistula.  Previous fistula intervention includes: primary repair and resection of the pseudoaneurysm.  Multiple AV fistula venograms with angioplasty for venous outflow narrowing.    PROCEDURE: 1. Leftarteriovenous graftcannulation under ultrasound guidance 2. Leftarm fistulogramincluding central venogram 3. Central venoplasty - left innominate vein (10 mm x 40 mm Mustang and 12 mm x 40 mm drug coated Lutonix) Peripheral venoplasty - venous outflow tract of left upper arm graft (8 mm x 60 mm Mustang)  She is experiencing low flow rates again at HD and is concerned about the central venous stenosis.  She denise pain, loss of sensation or loss of motor.  Past Medical History:  Diagnosis Date  . Anemia   . Asthma   . ESRD (end stage renal disease) ([place])    MWF  . GERD (gastroesophageal reflux disease)   . Gout   . 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 Heavy menstrual bleeding   . High cholesterol   . History of blood transfusion 02/2016- last one   1990's- several ones   . History of hiatal hernia   . History of kidney stones 1980's  . Hyperlipemia 12/07/2012  . Hypertension   . Hypothyroidism   . Multiple gastric ulcers   . Pneumonia 2012  . Pre-diabetes   . Prediabetes   . RA (rheumatoid arthritis) ([place])   . Sleep apnea    [place]- have CPAP, not able to use every night     Past Surgical History:  Procedure Laterality Date  . A/V SHUNTOGRAM N/A 09/08/2016   Procedure: A/V Shuntogram - Left Arm AV Graft;  Surgeon: Serafina Mitchell, MD;  Location: [place] [facility];  Service: Cardiovascular;  Laterality: N/A;  . A/V SHUNTOGRAM N/A 09/02/2017   Procedure: A/V SHUNTOGRAM - Left Arm AVG;  Surgeon: Conrad Perry, MD;  Location: [place] [facility];  Service: Cardiovascular;  Laterality: N/A;  . A/V SHUNTOGRAM N/A 12/21/2017   Procedure:  A/V SHUNTOGRAM - left arm;  Surgeon: Serafina Mitchell, MD;  Location: [place] [facility];  Service: Cardiovascular;  Laterality: N/A;  . A/V SHUNTOGRAM Left 10/05/2018   Procedure: A/V SHUNTOGRAM;  Surgeon: Marty Heck, MD;  Location: [place] [facility];  Service: Cardiovascular;  Laterality: Left;  . ABDOMINAL HYSTERECTOMY  2000  . AV FISTULA PLACEMENT Left 05/25/2016   Procedure: INSERTION OF LEFT UPPER ARM ARTERIOVENOUS (AV) LOOP GORE-TEX GRAFT ARM;  Surgeon: Elam Dutch, MD;  Location: [place];  Service: Vascular;  Laterality: Left;  . BASCILIC VEIN TRANSPOSITION Right 12/06/2014   Procedure: FIRST STAGE BASILIC VEIN TRANSPOSITION - RIGHT;  Surgeon: Serafina Mitchell, MD;  Location: [place];  Service: Vascular;  Laterality: Right;  . BASCILIC VEIN TRANSPOSITION Right 02/07/2015   Procedure: RIGHT ARM SECOND STAGE BASILIC VEIN TRANSPOSITION;  Surgeon: Serafina Mitchell, MD;  Location: [place];  Service: Vascular;  Laterality: Right;  . BASCILIC VEIN TRANSPOSITION Left 04/22/2016   Procedure: FIRST STAGE BASILIC VEIN TRANSPOSITION LEFT ARM;  Surgeon: Conrad Trimont, MD;  Location: [place];  Service: Vascular;  Laterality: Left;  . COLONOSCOPY W/ POLYPECTOMY    . ECTOPIC PREGNANCY SURGERY  02/1982  . EXCHANGE OF A DIALYSIS CATHETER Right 04/22/2016   Procedure: EXCHANGE OF A DIALYSIS CATHETER - INSERTION RIGHT INTERNAL JUGULAR & REMOVAL FROM LEFT INTERNAL JUGULAR;  Surgeon: Conrad Whiteface, MD;  Location: [place];  Service: Vascular;  Laterality: Right;  . I & D EXTREMITY Right 02/07/2015   Procedure: IRRIGATION AND DEBRIDEMENT RIGHT ARM HEMATOMA;  Surgeon: Serafina Mitchell, MD;  Location: [place];  Service: Vascular;  Laterality: Right;  . INSERTION OF DIALYSIS CATHETER  03/03/2016   Procedure: INSERTION OF DIALYSIS CATHETER;  Surgeon: Elam Dutch, MD;  Location: [place];  Service: Vascular;;  . IR AV DIALY SHUNT INTRO NEEDLE/INTRACATH INITIAL W/PTA/IMG LEFT  04/20/2017  . IR AV DIALY SHUNT INTRO  NEEDLE/INTRACATH INITIAL W/PTA/IMG LEFT  06/24/2017  . IR THROMBECTOMY AV FISTULA W/THROMBOLYSIS/PTA INC/SHUNT/IMG LEFT Left 01/04/2017  . IR US GUIDE VASC ACCESS LEFT  01/04/2017  . left shoulder surgery  08/2005  . LIGATION OF ARTERIOVENOUS  FISTULA  03/03/2016   Procedure: LIGATION OF ARTERIOVENOUS  FISTULA;  Surgeon: Elam Dutch, MD;  Location: [facility];  Service: Vascular;;  . LIGATION OF ARTERIOVENOUS  FISTULA Right 09/06/2018   Procedure: Resection of pseudoaneursym and end to end repair of right brachial artery;  Surgeon: Elam Dutch, MD;  [facility];  Service: Vascular;  Laterality: Right;  . PERIPHERAL VASCULAR BALLOON ANGIOPLASTY  09/08/2016   Procedure: Peripheral Vascular Balloon Angioplasty;  Surgeon: Serafina Mitchell, MD;  Location: [place] [facility];  Service: Cardiovascular;;  left avf  . PERIPHERAL VASCULAR BALLOON ANGIOPLASTY  09/02/2017   Procedure: PERIPHERAL VASCULAR BALLOON ANGIOPLASTY;  Surgeon: Conrad Boonton, MD;  Location: [place] [facility];  Service: Cardiovascular;;  left AV Graft  . PERIPHERAL VASCULAR BALLOON ANGIOPLASTY  12/21/2017   Procedure: PERIPHERAL VASCULAR BALLOON ANGIOPLASTY;  Surgeon: Serafina Mitchell, MD;  Location: [place] [facility];  Service: Cardiovascular;;  INOMINATE / UPPER ARM AV GRAFT  . PERIPHERAL VASCULAR BALLOON ANGIOPLASTY Left 05/12/2018   Procedure: PERIPHERAL VASCULAR BALLOON ANGIOPLASTY;  Surgeon: Marty Heck, MD;  Location: [place] [facility];  Service: Cardiovascular;  Laterality: Left;  Arm fistula  . PERIPHERAL VASCULAR BALLOON ANGIOPLASTY Left 10/05/2018   Procedure: PERIPHERAL VASCULAR BALLOON ANGIOPLASTY;  Surgeon: Marty Heck, MD;  Location: [place] [facility];  Service: Cardiovascular;  Laterality: Left;  central and peripheral vein  . TEE WITHOUT CARDIOVERSION N/A 03/06/2016   Procedure: TRANSESOPHAGEAL ECHOCARDIOGRAM (TEE);  Surgeon: Sueanne Margarita, MD;  Location: [facility] END[facility];  Service: Cardiovascular;   Laterality: N/A;  . TUBAL LIGATION  11/1986     Social History Social History   Tobacco Use  . Smoking status: Never Smoker  . Smokeless tobacco: Never Used  Substance Use Topics  . Alcohol use: Yes    Alcohol/week: 0.0 standard drinks    Comment: occ - 1 drink rarely  . Drug use: No    Family History Family History  Problem Relation Age of Onset  . Hypertension Mother   . Breast cancer Maternal Aunt   . Deep vein thrombosis Daughter   . Hyperlipidemia Daughter   . Hypertension Daughter   . Prostate cancer Maternal Grandmother     Allergies  Allergies  Allergen Reactions  . Atacand [Candesartan] Anaphylaxis and Other (See Comments)    Swelling of the mouth and tongue.  Marland Kitchen Lisinopril Anaphylaxis and Other (See Comments)    Swelling of the mouth and tongue.  . Nsaids Anaphylaxis and Other (See Comments)    Swelling of the mouth and tongue.  Marland Kitchen Penicillins Rash and Other (See Comments)    Has patient had a PCN reaction causing immediate rash, facial/tongue/throat  swelling, SOB or lightheadedness with hypotension: No Has patient had a PCN reaction causing severe rash involving mucus membranes or skin necrosis: No Has patient had a PCN reaction that required hospitalization No Has patient had a PCN reaction occurring within the last 10 years: No If all of the above answers are "NO", then may proceed with Cephalosporin use.      Current Outpatient Medications  Medication Sig Dispense Refill  . acetaminophen (TYLENOL) 650 MG CR tablet Take 650-1,300 mg by mouth every 8 (eight) hours as needed for pain.    Marland Kitchen albuterol (PROVENTIL) (2.5 MG/3ML) 0.083% nebulizer solution Take 3 mLs (2.5 mg total) by nebulization every 6 (six) hours as needed for wheezing or shortness of breath. ICD 10:J45.20 75 mL 12  . allopurinol (ZYLOPRIM) 100 MG tablet Take 1 tablet (100 mg total) by mouth 2 (two) times daily. 60 tablet 6  . amLODipine (NORVASC) 10 MG tablet TAKE 1 TABLET BY MOUTH EVERYDAY  AT BEDTIME 30 tablet 1  . atorvastatin (LIPITOR) 80 MG tablet Take 1 tablet (80 mg total) by mouth daily. Must have office visit for refills. 30 tablet 0  . budesonide-formoterol (SYMBICORT) 160-4.5 MCG/ACT inhaler Inhale 2 puffs into the lungs 2 (two) times daily for 1 day. 1 Inhaler 6  . carvedilol (COREG) 25 MG tablet Take 1 tablet (25 mg total) by mouth 2 (two) times daily with a meal. 60 tablet 1  . chlorpheniramine (CHLOR-TRIMETON) 4 MG tablet Take 1 tablet (4 mg total) by mouth 3 (three) times daily. (Patient taking differently: Take 8 mg by mouth daily at 12 noon. ) 190 tablet 0  . clotrimazole (LOTRIMIN) 1 % cream Apply 1 application topically 2 (two) times daily. (Patient taking differently: Apply 1 application topically 2 (two) times daily as needed (skin irritation.). ) 30 g 1  . cyclobenzaprine (FLEXERIL) 10 MG tablet Take 1 tablet (10 mg total) by mouth 2 (two) times daily as needed. For back pain 60 tablet 2  . famotidine (PEPCID) 20 MG tablet Take 1 tablet (20 mg total) by mouth 2 (two) times daily. 180 tablet 1  . fluticasone (FLONASE) 50 MCG/ACT nasal spray SPRAY 2 SPRAYS INTO EACH NOSTRIL EVERY DAY 16 mL 2  . gabapentin (NEURONTIN) 300 MG capsule TAKE 1 CAPSULE BY MOUTH TWICE A DAY (Patient taking differently: Take 300 mg by mouth 2 (two) times daily as needed (pain.). ) 180 capsule 1  . hydrALAZINE (APRESOLINE) 25 MG tablet Take 12.5 mg by mouth 3 (three) times daily.     . meclizine (ANTIVERT) 25 MG tablet Take 25 mg by mouth 3 (three) times daily as needed for dizziness.    . multivitamin (RENA-VIT) TABS tablet Take 1 tablet by mouth daily at 12 noon.     Marland Kitchen oxyCODONE-acetaminophen (PERCOCET) 5-325 MG tablet Take 1 tablet by mouth every 6 (six) hours as needed for severe pain. 12 tablet 0  . pantoprazole (PROTONIX) 40 MG tablet TAKE 1 TABLET BY MOUTH EVERY DAY 90 tablet 1  . RENVELA 800 MG tablet Take 800-2,400 mg by mouth See admin instructions. Take 3 tablets (2400 mg) by mouth  with each meal & take 1 tablet (800 mg) by mouth with each snack  11  . VENTOLIN HFA 108 (90 Base) MCG/ACT inhaler INHALE 2 PUFFS BY MOUTH EVERY 6 HOURS AS NEEDED FOR WHEEZE OR SHORTNESS OF BREATH (Patient taking differently: Inhale 2 puffs into the lungs every 6 (six) hours as needed for wheezing or shortness of breath. )  18 Inhaler 1   No current facility-administered medications for this visit.    ROS:   General:  No weight loss, Fever, chills  HEENT: No recent headaches, no nasal bleeding, no visual changes, no sore throat  Neurologic: No dizziness, blackouts, seizures. No recent symptoms of stroke or mini- stroke. No recent episodes of slurred speech, or temporary blindness.  Cardiac: No recent episodes of chest pain/pressure, no shortness of breath at rest.  No shortness of breath with exertion.  Denies history of atrial fibrillation or irregular heartbeat  Vascular: No history of rest pain in feet.  No history of claudication.  No history of non-healing ulcer, No history of DVT   Pulmonary: No home oxygen, no productive cough, no hemoptysis,  No asthma or wheezing  Musculoskeletal:  [ ]  Arthritis, [ ]  Low back pain,  [ ]  Joint pain  Hematologic:No history of hypercoagulable state.  No history of easy bleeding.  No history of anemia  Gastrointestinal: No hematochezia or melena,  No gastroesophageal reflux, no trouble swallowing  Urinary: [ ]  chronic Kidney disease, [x ] on HD - [x ] MWF or [ ]  TTHS, [ ]  Burning with urination, [ ]  Frequent urination, [ ]  Difficulty urinating;   Skin: No rashes  Psychological: No history of anxiety,  No history of depression   Physical Examination  Vitals:   04/13/19 1335  BP: (!) 157/94  Pulse: 80  Resp: 16  Temp: (!) 97.3 F (36.3 C)  TempSrc: Temporal  SpO2: 97%  Weight: 164 lb (74.4 kg)  Height: 5\' 2"  (1.575 m)    Body mass index is 30 kg/m.  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  She is in agreement.      Roxy Horseman PA-C [facility] [place] Office: [phone]  MD in clinic [place]

## 2019-04-13 ENCOUNTER — Ambulatory Visit (INDEPENDENT_AMBULATORY_CARE_PROVIDER_SITE_OTHER): Payer: Medicare Other | Admitting: Physician Assistant

## 2019-04-13 ENCOUNTER — Other Ambulatory Visit: Payer: Self-pay

## 2019-04-13 ENCOUNTER — Encounter: Payer: Self-pay | Admitting: Physician Assistant

## 2019-04-13 VITALS — BP 157/94 | HR 80 | Temp 97.3°F | Resp 16 | Ht 62.0 in | Wt 164.0 lb

## 2019-04-13 DIAGNOSIS — N186 End stage renal disease: Secondary | ICD-10-CM

## 2019-04-13 DIAGNOSIS — T82510D Breakdown (mechanical) of surgically created arteriovenous fistula, subsequent encounter: Secondary | ICD-10-CM | POA: Diagnosis not present

## 2019-04-13 DIAGNOSIS — Z992 Dependence on renal dialysis: Secondary | ICD-10-CM | POA: Diagnosis not present

## 2019-04-14 ENCOUNTER — Other Ambulatory Visit (HOSPITAL_COMMUNITY)
Admission: RE | Admit: 2019-04-14 | Discharge: 2019-04-14 | Disposition: A | Discharge status: Home / Self Care | Payer: Medicare Other | Source: Ambulatory Visit | Attending: Surgery | Admitting: Surgery

## 2019-04-14 DIAGNOSIS — Z992 Dependence on renal dialysis: Secondary | ICD-10-CM | POA: Diagnosis not present

## 2019-04-14 DIAGNOSIS — D631 Anemia in chronic kidney disease: Secondary | ICD-10-CM | POA: Diagnosis not present

## 2019-04-14 DIAGNOSIS — N2581 Secondary hyperparathyroidism of renal origin: Secondary | ICD-10-CM | POA: Diagnosis not present

## 2019-04-14 DIAGNOSIS — Z01812 Encounter for preprocedural laboratory examination: Secondary | ICD-10-CM | POA: Diagnosis not present

## 2019-04-14 DIAGNOSIS — E1129 Type 2 diabetes mellitus with other diabetic kidney complication: Secondary | ICD-10-CM | POA: Diagnosis not present

## 2019-04-14 DIAGNOSIS — Z20822 Contact with and (suspected) exposure to covid-19: Secondary | ICD-10-CM | POA: Diagnosis not present

## 2019-04-14 DIAGNOSIS — N186 End stage renal disease: Secondary | ICD-10-CM | POA: Diagnosis not present

## 2019-04-14 LAB — SARS CORONAVIRUS 2 (TAT 6-24 HRS): SARS Coronavirus 2: NEGATIVE

## 2019-04-17 DIAGNOSIS — E1129 Type 2 diabetes mellitus with other diabetic kidney complication: Secondary | ICD-10-CM | POA: Diagnosis not present

## 2019-04-17 DIAGNOSIS — N186 End stage renal disease: Secondary | ICD-10-CM | POA: Diagnosis not present

## 2019-04-17 DIAGNOSIS — Z992 Dependence on renal dialysis: Secondary | ICD-10-CM | POA: Diagnosis not present

## 2019-04-17 DIAGNOSIS — N2581 Secondary hyperparathyroidism of renal origin: Secondary | ICD-10-CM | POA: Diagnosis not present

## 2019-04-17 DIAGNOSIS — D631 Anemia in chronic kidney disease: Secondary | ICD-10-CM | POA: Diagnosis not present

## 2019-04-18 ENCOUNTER — Ambulatory Visit (HOSPITAL_COMMUNITY)
Admission: RE | Admit: 2019-04-18 | Discharge: 2019-04-18 | Disposition: A | Discharge status: Home / Self Care | Payer: Medicare Other | Attending: Surgery | Admitting: Surgery

## 2019-04-18 ENCOUNTER — Encounter (HOSPITAL_COMMUNITY)
Admission: RE | Disposition: A | Discharge status: Home / Self Care | Payer: Self-pay | Source: Home / Self Care | Attending: Surgery

## 2019-04-18 ENCOUNTER — Other Ambulatory Visit: Payer: Self-pay

## 2019-04-18 DIAGNOSIS — M069 Rheumatoid arthritis, unspecified: Secondary | ICD-10-CM | POA: Diagnosis not present

## 2019-04-18 DIAGNOSIS — M109 Gout, unspecified: Secondary | ICD-10-CM | POA: Diagnosis not present

## 2019-04-18 DIAGNOSIS — E785 Hyperlipidemia, unspecified: Secondary | ICD-10-CM | POA: Diagnosis not present

## 2019-04-18 DIAGNOSIS — E039 Hypothyroidism, unspecified: Secondary | ICD-10-CM | POA: Insufficient documentation

## 2019-04-18 DIAGNOSIS — Z880 Allergy status to penicillin: Secondary | ICD-10-CM | POA: Insufficient documentation

## 2019-04-18 DIAGNOSIS — N185 Chronic kidney disease, stage 5: Secondary | ICD-10-CM | POA: Diagnosis not present

## 2019-04-18 DIAGNOSIS — E7800 Pure hypercholesterolemia, unspecified: Secondary | ICD-10-CM | POA: Diagnosis not present

## 2019-04-18 DIAGNOSIS — I120 Hypertensive chronic kidney disease with stage 5 chronic kidney disease or end stage renal disease: Secondary | ICD-10-CM | POA: Insufficient documentation

## 2019-04-18 DIAGNOSIS — Z7951 Long term (current) use of inhaled steroids: Secondary | ICD-10-CM | POA: Diagnosis not present

## 2019-04-18 DIAGNOSIS — G4730 Sleep apnea, unspecified: Secondary | ICD-10-CM | POA: Insufficient documentation

## 2019-04-18 DIAGNOSIS — K219 Gastro-esophageal reflux disease without esophagitis: Secondary | ICD-10-CM | POA: Diagnosis not present

## 2019-04-18 DIAGNOSIS — J45909 Unspecified asthma, uncomplicated: Secondary | ICD-10-CM | POA: Diagnosis not present

## 2019-04-18 DIAGNOSIS — Z886 Allergy status to analgesic agent status: Secondary | ICD-10-CM | POA: Diagnosis not present

## 2019-04-18 DIAGNOSIS — T82858A Stenosis of vascular prosthetic devices, implants and grafts, initial encounter: Secondary | ICD-10-CM | POA: Insufficient documentation

## 2019-04-18 DIAGNOSIS — N186 End stage renal disease: Secondary | ICD-10-CM | POA: Insufficient documentation

## 2019-04-18 DIAGNOSIS — Y841 Kidney dialysis as the cause of abnormal reaction of the patient, or of later complication, without mention of misadventure at the time of the procedure: Secondary | ICD-10-CM | POA: Insufficient documentation

## 2019-04-18 DIAGNOSIS — Z79899 Other long term (current) drug therapy: Secondary | ICD-10-CM | POA: Diagnosis not present

## 2019-04-18 DIAGNOSIS — R7303 Prediabetes: Secondary | ICD-10-CM | POA: Insufficient documentation

## 2019-04-18 DIAGNOSIS — Z992 Dependence on renal dialysis: Secondary | ICD-10-CM | POA: Insufficient documentation

## 2019-04-18 DIAGNOSIS — T82898A Other specified complication of vascular prosthetic devices, implants and grafts, initial encounter: Secondary | ICD-10-CM | POA: Diagnosis not present

## 2019-04-18 DIAGNOSIS — Z888 Allergy status to other drugs, medicaments and biological substances status: Secondary | ICD-10-CM | POA: Diagnosis not present

## 2019-04-18 HISTORY — PX: PERIPHERAL VASCULAR BALLOON ANGIOPLASTY: CATH118281

## 2019-04-18 LAB — POCT I-STAT, CHEM 8
BUN: 29 mg/dL — ABNORMAL HIGH (ref 8–23)
Calcium, Ion: 1.01 mmol/L — ABNORMAL LOW (ref 1.15–1.40)
Chloride: 98 mmol/L (ref 98–111)
Creatinine, Ser: 7 mg/dL — ABNORMAL HIGH (ref 0.44–1.00)
Glucose, Bld: 87 mg/dL (ref 70–99)
HCT: 35 % — ABNORMAL LOW (ref 36.0–46.0)
Hemoglobin: 11.9 g/dL — ABNORMAL LOW (ref 12.0–15.0)
Potassium: 4.3 mmol/L (ref 3.5–5.1)
Sodium: 138 mmol/L (ref 135–145)
TCO2: 32 mmol/L (ref 22–32)

## 2019-04-18 SURGERY — PERIPHERAL VASCULAR BALLOON ANGIOPLASTY
Anesthesia: LOCAL | Laterality: Left

## 2019-04-18 MED ORDER — LIDOCAINE HCL (PF) 1 % IJ SOLN
INTRAMUSCULAR | Status: DC | PRN
  Administered 2019-04-18: 2 mL

## 2019-04-18 MED ORDER — HEPARIN (PORCINE) IN NACL 1000-0.9 UT/500ML-% IV SOLN
INTRAVENOUS | Status: DC | PRN
  Administered 2019-04-18: 500 mL

## 2019-04-18 MED ORDER — FENTANYL CITRATE (PF) 100 MCG/2ML IJ SOLN
INTRAMUSCULAR | Status: AC
  Filled 2019-04-18: qty 2

## 2019-04-18 MED ORDER — SODIUM CHLORIDE 0.9 % IV SOLN: 250.0000 mL | INTRAVENOUS | Status: DC | PRN

## 2019-04-18 MED ORDER — IODIXANOL 320 MG/ML IV SOLN
INTRAVENOUS | Status: DC | PRN
  Administered 2019-04-18: 50 mL

## 2019-04-18 MED ORDER — SODIUM CHLORIDE 0.9% FLUSH: 3.0000 mL | Freq: Two times a day (BID) | INTRAVENOUS | Status: DC

## 2019-04-18 MED ORDER — MIDAZOLAM HCL 2 MG/2ML IJ SOLN
INTRAMUSCULAR | Status: DC | PRN
  Administered 2019-04-18 (×2): 1 mg via INTRAVENOUS

## 2019-04-18 MED ORDER — LIDOCAINE HCL (PF) 1 % IJ SOLN
INTRAMUSCULAR | Status: AC
  Filled 2019-04-18: qty 30

## 2019-04-18 MED ORDER — SODIUM CHLORIDE 0.9% FLUSH: 3.0000 mL | INTRAVENOUS | Status: DC | PRN

## 2019-04-18 MED ORDER — MIDAZOLAM HCL 2 MG/2ML IJ SOLN
INTRAMUSCULAR | Status: AC
  Filled 2019-04-18: qty 2

## 2019-04-18 MED ORDER — HEPARIN (PORCINE) IN NACL 1000-0.9 UT/500ML-% IV SOLN
INTRAVENOUS | Status: AC
  Filled 2019-04-18: qty 500

## 2019-04-18 MED ORDER — FENTANYL CITRATE (PF) 100 MCG/2ML IJ SOLN
INTRAMUSCULAR | Status: DC | PRN
  Administered 2019-04-18 (×2): 25 ug via INTRAVENOUS

## 2019-04-18 SURGICAL SUPPLY — 17 items
BAG SNAP BAND KOVER 36X36 (MISCELLANEOUS) ×2 IMPLANT
BALLN MUSTANG 9X40X75 (BALLOONS) ×2
BALLOON MUSTANG 9X40X75 (BALLOONS) ×1 IMPLANT
COVER DOME SNAP 22 D (MISCELLANEOUS) ×2 IMPLANT
DCB RANGER 7.0X60 135 (BALLOONS) ×1 IMPLANT
KIT ENCORE 26 ADVANTAGE (KITS) ×2 IMPLANT
KIT MICROPUNCTURE NIT STIFF (SHEATH) ×2 IMPLANT
PROTECTION STATION PRESSURIZED (MISCELLANEOUS) ×2
RANGER DCB 7.0X60 135 (BALLOONS) ×2
SHEATH PINNACLE R/O II 6F 4CM (SHEATH) ×2 IMPLANT
SHEATH PROBE COVER 6X72 (BAG) ×4 IMPLANT
STATION PROTECTION PRESSURIZED (MISCELLANEOUS) ×1 IMPLANT
STOPCOCK MORSE 400PSI 3WAY (MISCELLANEOUS) ×2 IMPLANT
TRAY PV CATH (CUSTOM PROCEDURE TRAY) ×2 IMPLANT
TUBING CIL FLEX 10 FLL-RA (TUBING) ×2 IMPLANT
WIRE BENTSON .035X145CM (WIRE) ×2 IMPLANT
WIRE SPARTACORE .014X190CM (WIRE) ×2 IMPLANT

## 2019-04-18 NOTE — Progress Notes (Signed)
Pt states she is calling around to see if she can find a ride home. Rennis Harding, Rn called and informed.

## 2019-04-18 NOTE — Op Note (Signed)
    Patient name: Rachael George MRN: [id number] DOB: [birth year] Sex: female  04/18/2019 Pre-operative Diagnosis: ESRD  Post-operative diagnosis:  Same Surgeon:  Annamarie Major Procedure Performed:  1.  Ultrasound-guided access, left upper arm AVG G  2.  Shuntogram  3.  Left venoplasty (peripheral  4.  Conscious sedation 32 minutes    Indications: Patient has previously undergone percutaneous intervention and now back with recurrent issues.  Procedure:  The patient was identified in the holding area and taken to room 8.  The patient was then placed supine on the table and prepped and draped in the usual sterile fashion.  A time out was called.  Conscious sedation was administered with the use of IV fentanyl and Versed under continuous physician and nurse monitoring.  Heart rate, blood pressure, and oxygen saturations were continuously monitored.  Total sedation time was 32 minutes ultrasound was used to evaluate the fistula.  The vein was patent and compressible.  A digital ultrasound image was acquired.  The fistula was then accessed under ultrasound guidance using a micropuncture needle.  An 018 wire was then asvanced without resistance and a micropuncture sheath was placed.  Contrast injections were then performed through the sheath.  Findings: The previously treated innominate vein stenosis is approximately 30-40%.  The remaining portion of the central venous system is widely patent.  There is approximately a 60 to 70% outflow venous stenosis at the venous anastomosis.  The left arm loop graft is without stenosis.  The arterial anastomosis is widely patent   Intervention: After the above images were acquired the decision made to proceed with intervention.  Over an 035 wire, a 6 French sheath was inserted.  No heparin was given.  I then advanced a [place] core wire across the venous outflow tract.  I performed drug-coated balloon angioplasty using a 7 x 40 Ranger balloon.  This was held up at  burst pressure for 3 minutes.  Completion imaging revealed improved result.  There was still an area of narrowing in the vein just beyond the anastomosis and so I used a 9 x 40 Mustang balloon to treat this area.  This was held up for 1 minute.  Completion imaging showed residual stenosis less than 15%.  Suture was used to close the arteriotomy site once the sheath was removed.  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 Annamarie Major, M.D., [facility] [place] Office: [phone] Pager:  [phone]

## 2019-04-18 NOTE — Discharge Instructions (Signed)
Moderate Conscious Sedation, Adult, Care After These instructions provide you with information about caring for yourself after your procedure. Your health care provider may also give you more specific instructions. Your treatment has been planned according to current medical practices, but problems sometimes occur. Call your health care provider if you have any problems or questions after your procedure. What can I expect after the procedure? After your procedure, it is common:  To feel sleepy for several hours.  To feel clumsy and have poor balance for several hours.  To have poor judgment for several hours.  To vomit if you eat too soon. Follow these instructions at home: For at least 24 hours after the procedure:   Do not: ? Participate in activities where you could fall or become injured. ? Drive. ? Use heavy machinery. ? Drink alcohol. ? Take sleeping pills or medicines that cause drowsiness. ? Make important decisions or sign legal documents. ? Take care of children on your own.  Rest. Eating and drinking  Follow the diet recommended by your health care provider.  If you vomit: ? Drink water, juice, or soup when you can drink without vomiting. ? Make sure you have little or no nausea before eating solid foods. General instructions  Have a responsible adult stay with you until you are awake and alert.  Take over-the-counter and prescription medicines only as told by your health care provider.  If you smoke, do not smoke without supervision.  Keep all follow-up visits as told by your health care provider. This is important. Contact a health care provider if:  You keep feeling nauseous or you keep vomiting.  You feel light-headed.  You develop a rash.  You have a fever. Get help right away if:  You have trouble breathing. This information is not intended to replace advice given to you by your health care provider. Make sure you discuss any questions you have  with your health care provider. Document Revised: 02/19/2017 Document Reviewed: 06/29/2015 Elsevier Patient Education  2020 Elsevier Inc.  

## 2019-04-18 NOTE — Interval H&P Note (Signed)
History and Physical Interval Note:  04/18/2019 11:07 AM  Rachael George  has presented today for surgery, with the diagnosis of peripheral vascular disease.  The various methods of treatment have been discussed with the patient and family. After consideration of risks, benefits and other options for treatment, the patient has consented to  Procedure(s): A/V FISTULAGRAM (Left) as a surgical intervention.  The patient's history has been reviewed, patient examined, no change in status, stable for surgery.  I have reviewed the patient's chart and labs.  Questions were answered to the patient's satisfaction.     Annamarie Major

## 2019-04-19 DIAGNOSIS — N186 End stage renal disease: Secondary | ICD-10-CM | POA: Diagnosis not present

## 2019-04-19 DIAGNOSIS — Z992 Dependence on renal dialysis: Secondary | ICD-10-CM | POA: Diagnosis not present

## 2019-04-19 DIAGNOSIS — D631 Anemia in chronic kidney disease: Secondary | ICD-10-CM | POA: Diagnosis not present

## 2019-04-19 DIAGNOSIS — N2581 Secondary hyperparathyroidism of renal origin: Secondary | ICD-10-CM | POA: Diagnosis not present

## 2019-04-19 DIAGNOSIS — E1129 Type 2 diabetes mellitus with other diabetic kidney complication: Secondary | ICD-10-CM | POA: Diagnosis not present

## 2019-04-20 ENCOUNTER — Other Ambulatory Visit: Payer: Self-pay | Admitting: *Deleted

## 2019-04-20 NOTE — Patient Outreach (Addendum)
[place] [facility]) Care Management  04/20/2019  ADJOA OVERHOLTZER 1955-08-09 [id number]  Successful telephone outreach call to patient. HIPAA identifiers obtained. Nurse called to screen patient for [facility] program and patient accepted. Patient explained that she does have renal failure and has dialysis M-W-F. She has had a long history of hypertension which is not very well controlled despite different medications. Patient does monitor blood pressure on off dialysis days and states that her ranges are 140-150 /90. Has a history of borderline diabetes with last A1c of 5.4. Does not monitor blood sugar at home but did ask about meter. Nurse encouraged patient to ask PCP for meter prescription.  Lives at home with 2 grandchildren and has expressed concerns of affordability of paying rent and would like to discuss housing with SW. Also, would like to know if she would qualify for Meals on Wheels. Ambulates independently but does use cane or walker at times especially on dialysis days. Denies any falls and transports self to medical appointments. Reports about taking 15 medications daily of which she manages herself and states she does not have any issue with affording her medication. States she has an upcoming appointment with PCP 05/23/19.   Reviewed and discussed [facility] program and patient agrees to SW and Engineer, maintenance services.  Plan: RN Health Coach placed SW referral for housing and Meals on [place] sent [place] sent Calendar Booklet to document blood pressures RN Health Coach will call patient within the month February to complete the initial assessment and patient agrees to future outreach calls.   Emelia Loron RN, BSN [place] [phone] [email or web address]

## 2019-04-24 ENCOUNTER — Other Ambulatory Visit: Payer: Self-pay | Admitting: Family Medicine

## 2019-04-24 DIAGNOSIS — I129 Hypertensive chronic kidney disease with stage 1 through stage 4 chronic kidney disease, or unspecified chronic kidney disease: Secondary | ICD-10-CM | POA: Diagnosis not present

## 2019-04-24 DIAGNOSIS — N2581 Secondary hyperparathyroidism of renal origin: Secondary | ICD-10-CM | POA: Diagnosis not present

## 2019-04-24 DIAGNOSIS — Z992 Dependence on renal dialysis: Secondary | ICD-10-CM | POA: Diagnosis not present

## 2019-04-24 DIAGNOSIS — N186 End stage renal disease: Secondary | ICD-10-CM | POA: Diagnosis not present

## 2019-04-24 DIAGNOSIS — D631 Anemia in chronic kidney disease: Secondary | ICD-10-CM | POA: Diagnosis not present

## 2019-04-24 DIAGNOSIS — E1129 Type 2 diabetes mellitus with other diabetic kidney complication: Secondary | ICD-10-CM | POA: Diagnosis not present

## 2019-04-24 DIAGNOSIS — J4520 Mild intermittent asthma, uncomplicated: Secondary | ICD-10-CM

## 2019-04-24 DIAGNOSIS — I10 Essential (primary) hypertension: Secondary | ICD-10-CM

## 2019-04-25 ENCOUNTER — Other Ambulatory Visit: Payer: Self-pay

## 2019-04-25 NOTE — Patient Outreach (Signed)
[place] [facility]) Care Management  04/25/2019  Rachael George 10-09-1955 [id number]   Social Work referral received on 04/20/19 from [facility], [employer] Per referral, patient is in need of housing resources and interested in Meals on Wheels.   Successful outreach to patient today.  Informed patient that Meals on Wheels program is typically for individuals that are homebound and unable to prepare food for themselves.  Also informed patient that program has wait list.  Will submit referral to have patient added to wait list and she will be contacted for assessment of eligibility.   Patient seeking more affordable housing and is actually already aware of/using resources that would typically suggested.  Patient is regularly checking options on [email or web address] and [employer].  She also stated that she is regularly checking [place] as they sometimes list places for rent.  Encouraged patient to also check options available through Affordable Housing Management and to contact [employer].   Informed patient that lower income properties in the area tend to have wait lists especially during this time of [place]. Encouraged her to be added to wait list for any properties she may be interested in.  Referral made to Meals on Wheels via [employer] [place] platform.  Social Work case is being closed due to resources being provided.  Did encourage her to call if additional needs arise.  Ronn Melena, BSW Social Worker [phone]

## 2019-04-26 DIAGNOSIS — N186 End stage renal disease: Secondary | ICD-10-CM | POA: Diagnosis not present

## 2019-04-26 DIAGNOSIS — Z992 Dependence on renal dialysis: Secondary | ICD-10-CM | POA: Diagnosis not present

## 2019-04-26 DIAGNOSIS — N2581 Secondary hyperparathyroidism of renal origin: Secondary | ICD-10-CM | POA: Diagnosis not present

## 2019-04-26 DIAGNOSIS — D631 Anemia in chronic kidney disease: Secondary | ICD-10-CM | POA: Diagnosis not present

## 2019-04-26 DIAGNOSIS — E1129 Type 2 diabetes mellitus with other diabetic kidney complication: Secondary | ICD-10-CM | POA: Diagnosis not present

## 2019-04-27 ENCOUNTER — Other Ambulatory Visit: Payer: Self-pay | Admitting: *Deleted

## 2019-04-27 ENCOUNTER — Encounter: Payer: Self-pay | Admitting: *Deleted

## 2019-04-27 NOTE — Patient Outreach (Signed)
[name] Care Management  04/27/2019   Rachael George Feb 09, 1956 [id number]  Successful telephone outreach call to patient for initial assessment. HIPAA identifiers obtained.  Social: Patient's 2 grandchildren live with her. She is independent with all ADLs , IADLs, and currently transports herself to all medical appointments. Patient denies having any recent falls and reports that her home environment is safe. [name] states that her [name] would be able to provide care for the patient if the they were to need assistance.  Emotionally the patient is doing well at this time.  Conditions: Past medical history includes but is not limited to: ESRD, hypertension, asthma, sleep apnea, diabetes, hyperlipidemia, and anemia in chronic kidney disease. [name] is a dialysis patient who receives treatments on M-W-F. She states that uncontrolled hypertension caused her kidneys to fail. Her blood pressure is taken often during dialysis and she states she usually takes her blood pressure at home about twice during her off days, she does not record her values. Patient reports that her blood pressure usually is [id number] to [id number] systolic range with a diastolic value of 90. Her B/P has been as high as 220/130 and as low as systolic 54 during dialysis. The patient tries to follow a low sodium diet but was unsure of what sodium parameter she needed to stay within. She also follows a heart healthy diet and renal diet. She explained that currently she is not on any fluid restriction because she does urinate several times daily. Patient discussed that she has been experiencing blurry vision and she does have an ophthalmology appointment with [name] on 04/28/19 to follow-up. RN provided the patient daily recommended sodium limits. Nurse discussed that blurry vision is an symptom of both high blood pressure and stroke and encouraged patient to take her blood pressure when she was having episodes of  blurred vision and daily and record values.    Medications: Patient takes 58 different medications of which she manages herself. She states she does not experience any bad side effects. She does not have any questions or concerns about her medications and she does not have any difficulty affording her medications.  Appointments: Patient has an upcoming PCP visit on 05/23/19. She has an ophthalmologist appointment on 04/28/19.  Advanced Directives: Patient does not have Advanced Directives. She explained that she does have the documents at home which were provided by her dialysis team. She will inform nurse if she needs any assistance with filling the documents out.    Consent: Consent: [facility] services were reviewed and discussed with patient. Patient verbally agrees to participate in the [facility] program and to disease management outreaches.  Current Medications:  Current Outpatient Medications  Medication Sig Dispense Refill  . acetaminophen (TYLENOL) 650 MG CR tablet Take 650-1,300 mg by mouth every 8 (eight) hours as needed for pain.    Marland Kitchen albuterol (PROVENTIL) (2.5 MG/3ML) 0.083% nebulizer solution Take 3 mLs (2.5 mg total) by nebulization every 6 (six) hours as needed for wheezing or shortness of breath. ICD 10:J45.20 75 mL 12  . allopurinol (ZYLOPRIM) 100 MG tablet Take 1 tablet (100 mg total) by mouth 2 (two) times daily. (Patient taking differently: Take 100 mg by mouth daily as needed (gout). ) 60 tablet 6  . amLODipine (NORVASC) 10 MG tablet TAKE 1 TABLET BY MOUTH EVERYDAY AT BEDTIME 30 tablet 0  . atorvastatin (LIPITOR) 80 MG tablet Take 1 tablet (80 mg total) by mouth daily. Must have office visit for refills. (  Patient taking differently: Take 80 mg by mouth at bedtime. Must have office visit for refills.) 30 tablet 0  . budesonide-formoterol (SYMBICORT) 160-4.5 MCG/ACT inhaler Inhale 2 puffs into the lungs 2 (two) times daily for 1 day. 1 Inhaler 6  . carvedilol (COREG) 25 MG tablet Take 1 tablet  (25 mg total) by mouth 2 (two) times daily with a meal. 60 tablet 1  . chlorpheniramine (CHLOR-TRIMETON) 4 MG tablet Take 1 tablet (4 mg total) by mouth 3 (three) times daily. (Patient taking differently: Take 8 mg by mouth daily at 12 noon. ) 190 tablet 0  . clotrimazole (LOTRIMIN) 1 % cream Apply 1 application topically 2 (two) times daily. (Patient taking differently: Apply 1 application topically 2 (two) times daily as needed (skin irritation.). ) 30 g 1  . cyclobenzaprine (FLEXERIL) 10 MG tablet Take 1 tablet (10 mg total) by mouth 2 (two) times daily as needed. For back pain 60 tablet 2  . famotidine (PEPCID) 20 MG tablet Take 1 tablet (20 mg total) by mouth 2 (two) times daily. 180 tablet 1  . fluticasone (FLONASE) 50 MCG/ACT nasal spray SPRAY 2 SPRAYS INTO EACH NOSTRIL EVERY DAY (Patient taking differently: Place 2 sprays into both nostrils daily. ) 16 mL 2  . hydrALAZINE (APRESOLINE) 25 MG tablet Take 25 mg by mouth 3 (three) times daily.     . meclizine (ANTIVERT) 25 MG tablet Take 25 mg by mouth 3 (three) times daily as needed for dizziness.    . multivitamin (RENA-VIT) TABS tablet Take 1 tablet by mouth daily with lunch.     . pantoprazole (PROTONIX) 40 MG tablet TAKE 1 TABLET BY MOUTH EVERY DAY (Patient taking differently: Take 40 mg by mouth daily. ) 90 tablet 1  . VELPHORO 500 MG chewable tablet Chew 500-1,000 mg by mouth See admin instructions. Take 2 tablets (1000 mg) by mouth with each meals & take 1 tablet (500 mg) by mouth with each snack    . VENTOLIN HFA 108 (90 Base) MCG/ACT inhaler INHALE 2 PUFFS BY MOUTH EVERY 6 HOURS AS NEEDED FOR WHEEZE OR SHORTNESS OF BREATH 18 g 0  . gabapentin (NEURONTIN) 300 MG capsule TAKE 1 CAPSULE BY MOUTH TWICE A DAY (Patient not taking: No sig reported) 180 capsule 1   No current facility-administered medications for this visit.    Functional Status:  In your present state of health, do you have any difficulty performing the following activities:  04/27/2019 09/06/2018  Hearing? N N  Vision? Y N  Comment reports blurry vision and floater, has ophthalmologist appnt 04/28/19 -  Difficulty concentrating or making decisions? N N  Walking or climbing stairs? N N  Dressing or bathing? N N  Doing errands, shopping? N -  Preparing Food and eating ? N -  Using the Toilet? N -  In the past six months, have you accidently leaked urine? N -  Do you have problems with loss of bowel control? N -  Managing your Medications? N -  Managing your Finances? N -  Housekeeping or managing your Housekeeping? N -  Some recent data might be hidden    Fall/Depression Screening: Fall Risk  04/27/2019 04/20/2019 08/30/2018  Falls in the past year? 0 0 0  Number falls in past yr: 0 0 -  Injury with Fall? 0 0 -  Risk for fall due to : Impaired balance/gait;Impaired mobility;Impaired vision Impaired balance/gait;Impaired mobility -  Risk for fall due to: Comment gets dizzy at times during  dialysis gets dizzy at times with  dialysis -  Follow up Falls prevention discussed;Education provided;Falls evaluation completed Falls prevention discussed;Education provided;Falls evaluation completed -   PHQ 2/9 Scores 04/27/2019 04/20/2019 04/20/2019 05/24/2018 11/23/2017 05/13/2017 12/01/2016  PHQ - 2 Score 0 0 0 0 0 0 0  PHQ- 9 Score - - - 0 0 1 1   THN CM Care Plan Problem One     Most Recent Value  Care Plan Problem One  Knowledge Deficit of hypertension management related to wide ranges of B/P values and diet adherence  Role Documenting the Problem One  [place] for Problem One  Active  [facility] Long Term Goal   Patient will read food labels for sodium content and limit sodium between 1500-2000 mg daily for the next 90 days  [facility] Long Term Goal Start Date  04/27/19  Interventions for Problem One Long Term Goal  RN discussed the importance of taking B/P daily and recording the data, reviewed recommended sodium limits for hypertension, encouraged medication adherence,  sent a matter of choices blood pressure control booklet, sent calendar booklet for patient to record daily B/P values, discussed increased stress levels and how stress can elevate B/P, reviewed signs and symptoms of stroke.  [facility] CM Short Term Goal #1   Patient will exercise 2 times weekly for the next 30 days  THN CM Short Term Goal #1 Start Date  04/27/19  Interventions for Short Term Goal #1  Provided [employer] television exercise program resource for seniors, encouraged patient to increase her physical activity, discussed doing chair exercises and walking when weather permits, informed patient that listening to music and dancing when doing house-work can be a fun way to increase physical activity.     Plan:  RN Health Coach will send Barrier letter  [place] will send A Matter of Choices Blood [place] will send PCP initial note RN Health Coach will call patient within the month of March and patient agrees to future outreach calls.   Emelia Loron RN, BSN [place] [phone] [email or web address]

## 2019-04-27 NOTE — Patient Outreach (Addendum)
[facility]) Care Management  04/27/2019  SOPHEY STREHLE [birth year] [id number]   Unsuccessful outreach attempt made to patient for initial assessment call. Patient answered the call and stated that she was driving. She asked that the nurse call her back later today.   Plan: RN Health Coach will call patient back later today.   Emelia Loron RN, BSN [place] [phone] [email or web address]

## 2019-04-28 DIAGNOSIS — N2581 Secondary hyperparathyroidism of renal origin: Secondary | ICD-10-CM | POA: Diagnosis not present

## 2019-04-28 DIAGNOSIS — E119 Type 2 diabetes mellitus without complications: Secondary | ICD-10-CM | POA: Diagnosis not present

## 2019-04-28 DIAGNOSIS — H04123 Dry eye syndrome of bilateral lacrimal glands: Secondary | ICD-10-CM | POA: Diagnosis not present

## 2019-04-28 DIAGNOSIS — H34212 Partial retinal artery occlusion, left eye: Secondary | ICD-10-CM | POA: Diagnosis not present

## 2019-04-28 DIAGNOSIS — Z992 Dependence on renal dialysis: Secondary | ICD-10-CM | POA: Diagnosis not present

## 2019-04-28 DIAGNOSIS — H3562 Retinal hemorrhage, left eye: Secondary | ICD-10-CM | POA: Diagnosis not present

## 2019-04-28 DIAGNOSIS — H31092 Other chorioretinal scars, left eye: Secondary | ICD-10-CM | POA: Diagnosis not present

## 2019-04-28 DIAGNOSIS — N186 End stage renal disease: Secondary | ICD-10-CM | POA: Diagnosis not present

## 2019-04-28 DIAGNOSIS — H35033 Hypertensive retinopathy, bilateral: Secondary | ICD-10-CM | POA: Diagnosis not present

## 2019-04-28 DIAGNOSIS — R519 Headache, unspecified: Secondary | ICD-10-CM | POA: Insufficient documentation

## 2019-04-28 DIAGNOSIS — D631 Anemia in chronic kidney disease: Secondary | ICD-10-CM | POA: Diagnosis not present

## 2019-04-28 DIAGNOSIS — H2513 Age-related nuclear cataract, bilateral: Secondary | ICD-10-CM | POA: Diagnosis not present

## 2019-04-28 DIAGNOSIS — E1129 Type 2 diabetes mellitus with other diabetic kidney complication: Secondary | ICD-10-CM | POA: Diagnosis not present

## 2019-04-28 LAB — HM DIABETES EYE EXAM

## 2019-05-01 DIAGNOSIS — N186 End stage renal disease: Secondary | ICD-10-CM | POA: Diagnosis not present

## 2019-05-01 DIAGNOSIS — E1129 Type 2 diabetes mellitus with other diabetic kidney complication: Secondary | ICD-10-CM | POA: Diagnosis not present

## 2019-05-01 DIAGNOSIS — Z992 Dependence on renal dialysis: Secondary | ICD-10-CM | POA: Diagnosis not present

## 2019-05-01 DIAGNOSIS — N2581 Secondary hyperparathyroidism of renal origin: Secondary | ICD-10-CM | POA: Diagnosis not present

## 2019-05-01 DIAGNOSIS — D631 Anemia in chronic kidney disease: Secondary | ICD-10-CM | POA: Diagnosis not present

## 2019-05-03 DIAGNOSIS — Z992 Dependence on renal dialysis: Secondary | ICD-10-CM | POA: Diagnosis not present

## 2019-05-03 DIAGNOSIS — D631 Anemia in chronic kidney disease: Secondary | ICD-10-CM | POA: Diagnosis not present

## 2019-05-03 DIAGNOSIS — E1129 Type 2 diabetes mellitus with other diabetic kidney complication: Secondary | ICD-10-CM | POA: Diagnosis not present

## 2019-05-03 DIAGNOSIS — N186 End stage renal disease: Secondary | ICD-10-CM | POA: Diagnosis not present

## 2019-05-03 DIAGNOSIS — N2581 Secondary hyperparathyroidism of renal origin: Secondary | ICD-10-CM | POA: Diagnosis not present

## 2019-05-08 DIAGNOSIS — E1129 Type 2 diabetes mellitus with other diabetic kidney complication: Secondary | ICD-10-CM | POA: Diagnosis not present

## 2019-05-08 DIAGNOSIS — D631 Anemia in chronic kidney disease: Secondary | ICD-10-CM | POA: Diagnosis not present

## 2019-05-08 DIAGNOSIS — Z992 Dependence on renal dialysis: Secondary | ICD-10-CM | POA: Diagnosis not present

## 2019-05-08 DIAGNOSIS — N186 End stage renal disease: Secondary | ICD-10-CM | POA: Diagnosis not present

## 2019-05-08 DIAGNOSIS — N2581 Secondary hyperparathyroidism of renal origin: Secondary | ICD-10-CM | POA: Diagnosis not present

## 2019-05-09 ENCOUNTER — Other Ambulatory Visit: Payer: Self-pay | Admitting: Family Medicine

## 2019-05-09 ENCOUNTER — Telehealth: Payer: Self-pay | Admitting: Family Medicine

## 2019-05-09 DIAGNOSIS — J4520 Mild intermittent asthma, uncomplicated: Secondary | ICD-10-CM

## 2019-05-09 MED ORDER — ALBUTEROL SULFATE (2.5 MG/3ML) 0.083% IN NEBU: 2.5000 mg | INHALATION_SOLUTION | Freq: Four times a day (QID) | RESPIRATORY_TRACT | 0 refills | Status: AC | PRN

## 2019-05-09 NOTE — Telephone Encounter (Signed)
Patient called and requested for listed medication to be refilled and sent to [facility]. Please follow up at your earliest convenience.  albuterol (PROVENTIL) (2.5 MG/3ML) 0.083% nebulizer solution [id number]

## 2019-05-09 NOTE — Telephone Encounter (Signed)
Rx sent 

## 2019-05-10 DIAGNOSIS — D631 Anemia in chronic kidney disease: Secondary | ICD-10-CM | POA: Diagnosis not present

## 2019-05-10 DIAGNOSIS — E1129 Type 2 diabetes mellitus with other diabetic kidney complication: Secondary | ICD-10-CM | POA: Diagnosis not present

## 2019-05-10 DIAGNOSIS — Z992 Dependence on renal dialysis: Secondary | ICD-10-CM | POA: Diagnosis not present

## 2019-05-10 DIAGNOSIS — N2581 Secondary hyperparathyroidism of renal origin: Secondary | ICD-10-CM | POA: Diagnosis not present

## 2019-05-10 DIAGNOSIS — N186 End stage renal disease: Secondary | ICD-10-CM | POA: Diagnosis not present

## 2019-05-12 DIAGNOSIS — D631 Anemia in chronic kidney disease: Secondary | ICD-10-CM | POA: Diagnosis not present

## 2019-05-12 DIAGNOSIS — N186 End stage renal disease: Secondary | ICD-10-CM | POA: Diagnosis not present

## 2019-05-12 DIAGNOSIS — Z992 Dependence on renal dialysis: Secondary | ICD-10-CM | POA: Diagnosis not present

## 2019-05-12 DIAGNOSIS — E1129 Type 2 diabetes mellitus with other diabetic kidney complication: Secondary | ICD-10-CM | POA: Diagnosis not present

## 2019-05-12 DIAGNOSIS — N2581 Secondary hyperparathyroidism of renal origin: Secondary | ICD-10-CM | POA: Diagnosis not present

## 2019-05-15 DIAGNOSIS — E1129 Type 2 diabetes mellitus with other diabetic kidney complication: Secondary | ICD-10-CM | POA: Diagnosis not present

## 2019-05-15 DIAGNOSIS — N2581 Secondary hyperparathyroidism of renal origin: Secondary | ICD-10-CM | POA: Diagnosis not present

## 2019-05-15 DIAGNOSIS — Z992 Dependence on renal dialysis: Secondary | ICD-10-CM | POA: Diagnosis not present

## 2019-05-15 DIAGNOSIS — D631 Anemia in chronic kidney disease: Secondary | ICD-10-CM | POA: Diagnosis not present

## 2019-05-15 DIAGNOSIS — N186 End stage renal disease: Secondary | ICD-10-CM | POA: Diagnosis not present

## 2019-05-17 DIAGNOSIS — D631 Anemia in chronic kidney disease: Secondary | ICD-10-CM | POA: Diagnosis not present

## 2019-05-17 DIAGNOSIS — E1129 Type 2 diabetes mellitus with other diabetic kidney complication: Secondary | ICD-10-CM | POA: Diagnosis not present

## 2019-05-17 DIAGNOSIS — N186 End stage renal disease: Secondary | ICD-10-CM | POA: Diagnosis not present

## 2019-05-17 DIAGNOSIS — N2581 Secondary hyperparathyroidism of renal origin: Secondary | ICD-10-CM | POA: Diagnosis not present

## 2019-05-17 DIAGNOSIS — Z992 Dependence on renal dialysis: Secondary | ICD-10-CM | POA: Diagnosis not present

## 2019-05-19 ENCOUNTER — Other Ambulatory Visit: Payer: Self-pay | Admitting: Family Medicine

## 2019-05-19 DIAGNOSIS — N186 End stage renal disease: Secondary | ICD-10-CM | POA: Diagnosis not present

## 2019-05-19 DIAGNOSIS — D631 Anemia in chronic kidney disease: Secondary | ICD-10-CM | POA: Diagnosis not present

## 2019-05-19 DIAGNOSIS — I10 Essential (primary) hypertension: Secondary | ICD-10-CM

## 2019-05-19 DIAGNOSIS — N2581 Secondary hyperparathyroidism of renal origin: Secondary | ICD-10-CM | POA: Diagnosis not present

## 2019-05-19 DIAGNOSIS — Z992 Dependence on renal dialysis: Secondary | ICD-10-CM | POA: Diagnosis not present

## 2019-05-19 DIAGNOSIS — E1129 Type 2 diabetes mellitus with other diabetic kidney complication: Secondary | ICD-10-CM | POA: Diagnosis not present

## 2019-05-22 ENCOUNTER — Other Ambulatory Visit: Payer: Self-pay | Admitting: Family Medicine

## 2019-05-22 DIAGNOSIS — N186 End stage renal disease: Secondary | ICD-10-CM | POA: Diagnosis not present

## 2019-05-22 DIAGNOSIS — E1169 Type 2 diabetes mellitus with other specified complication: Secondary | ICD-10-CM

## 2019-05-22 DIAGNOSIS — I129 Hypertensive chronic kidney disease with stage 1 through stage 4 chronic kidney disease, or unspecified chronic kidney disease: Secondary | ICD-10-CM | POA: Diagnosis not present

## 2019-05-22 DIAGNOSIS — Z992 Dependence on renal dialysis: Secondary | ICD-10-CM | POA: Diagnosis not present

## 2019-05-23 ENCOUNTER — Ambulatory Visit: Payer: Medicare Other | Attending: Family Medicine | Admitting: Family Medicine

## 2019-05-23 ENCOUNTER — Other Ambulatory Visit: Payer: Self-pay

## 2019-05-23 ENCOUNTER — Encounter: Payer: Self-pay | Admitting: Family Medicine

## 2019-05-23 DIAGNOSIS — E785 Hyperlipidemia, unspecified: Secondary | ICD-10-CM | POA: Diagnosis not present

## 2019-05-23 DIAGNOSIS — Z880 Allergy status to penicillin: Secondary | ICD-10-CM | POA: Diagnosis not present

## 2019-05-23 DIAGNOSIS — Z8711 Personal history of peptic ulcer disease: Secondary | ICD-10-CM | POA: Diagnosis not present

## 2019-05-23 DIAGNOSIS — I120 Hypertensive chronic kidney disease with stage 5 chronic kidney disease or end stage renal disease: Secondary | ICD-10-CM | POA: Insufficient documentation

## 2019-05-23 DIAGNOSIS — Z7682 Awaiting organ transplant status: Secondary | ICD-10-CM | POA: Diagnosis not present

## 2019-05-23 DIAGNOSIS — E039 Hypothyroidism, unspecified: Secondary | ICD-10-CM | POA: Insufficient documentation

## 2019-05-23 DIAGNOSIS — Z79899 Other long term (current) drug therapy: Secondary | ICD-10-CM | POA: Insufficient documentation

## 2019-05-23 DIAGNOSIS — Z992 Dependence on renal dialysis: Secondary | ICD-10-CM | POA: Diagnosis not present

## 2019-05-23 DIAGNOSIS — Z8249 Family history of ischemic heart disease and other diseases of the circulatory system: Secondary | ICD-10-CM | POA: Diagnosis not present

## 2019-05-23 DIAGNOSIS — M1A09X0 Idiopathic chronic gout, multiple sites, without tophus (tophi): Secondary | ICD-10-CM | POA: Insufficient documentation

## 2019-05-23 DIAGNOSIS — Z87442 Personal history of urinary calculi: Secondary | ICD-10-CM | POA: Insufficient documentation

## 2019-05-23 DIAGNOSIS — M069 Rheumatoid arthritis, unspecified: Secondary | ICD-10-CM | POA: Insufficient documentation

## 2019-05-23 DIAGNOSIS — N186 End stage renal disease: Secondary | ICD-10-CM | POA: Diagnosis not present

## 2019-05-23 DIAGNOSIS — I10 Essential (primary) hypertension: Secondary | ICD-10-CM | POA: Diagnosis not present

## 2019-05-23 DIAGNOSIS — G4730 Sleep apnea, unspecified: Secondary | ICD-10-CM | POA: Insufficient documentation

## 2019-05-23 DIAGNOSIS — Z886 Allergy status to analgesic agent status: Secondary | ICD-10-CM | POA: Diagnosis not present

## 2019-05-23 DIAGNOSIS — Z888 Allergy status to other drugs, medicaments and biological substances status: Secondary | ICD-10-CM | POA: Insufficient documentation

## 2019-05-23 DIAGNOSIS — E1122 Type 2 diabetes mellitus with diabetic chronic kidney disease: Secondary | ICD-10-CM | POA: Insufficient documentation

## 2019-05-23 DIAGNOSIS — J45909 Unspecified asthma, uncomplicated: Secondary | ICD-10-CM | POA: Diagnosis not present

## 2019-05-23 DIAGNOSIS — Z7951 Long term (current) use of inhaled steroids: Secondary | ICD-10-CM | POA: Diagnosis not present

## 2019-05-23 DIAGNOSIS — E119 Type 2 diabetes mellitus without complications: Secondary | ICD-10-CM | POA: Diagnosis present

## 2019-05-23 DIAGNOSIS — K219 Gastro-esophageal reflux disease without esophagitis: Secondary | ICD-10-CM | POA: Diagnosis not present

## 2019-05-23 DIAGNOSIS — Z9862 Peripheral vascular angioplasty status: Secondary | ICD-10-CM | POA: Diagnosis not present

## 2019-05-23 DIAGNOSIS — Z8349 Family history of other endocrine, nutritional and metabolic diseases: Secondary | ICD-10-CM | POA: Insufficient documentation

## 2019-05-23 LAB — POCT GLYCOSYLATED HEMOGLOBIN (HGB A1C): HbA1c, POC (controlled diabetic range): 5.3 % (ref 0.0–7.0)

## 2019-05-23 MED ORDER — AMLODIPINE BESYLATE 10 MG PO TABS: ORAL_TABLET | ORAL | 6 refills | Status: DC

## 2019-05-23 MED ORDER — CARVEDILOL 25 MG PO TABS: 25.0000 mg | ORAL_TABLET | Freq: Two times a day (BID) | ORAL | 6 refills | Status: DC

## 2019-05-23 MED ORDER — ALLOPURINOL 100 MG PO TABS: 100.0000 mg | ORAL_TABLET | Freq: Every day | ORAL | 6 refills | Status: DC

## 2019-05-23 MED ORDER — ATORVASTATIN CALCIUM 80 MG PO TABS: 80.0000 mg | ORAL_TABLET | Freq: Every day | ORAL | 6 refills | Status: DC

## 2019-05-23 NOTE — Progress Notes (Signed)
Patient is fasting and has not taken any medications.

## 2019-05-23 NOTE — Progress Notes (Signed)
Subjective:  Patient ID: Rachael George, female    DOB: 01-26-1956  Age: 64 y.o. MRN: [id number]  CC: Diabetes   HPI Rachael George is a  64 yr old female with a past medical history of hypertension, type 2 diabetes mellutlits (diet controlled with A1c of 5.3), ESRD on dialysis (Monday, Wed & Friday, currently awaiting organ transplant) and Gout who presents today for follow-up visit.  Prolonged sitting at the hemodialysis center causes the area above her anus to split with associated discharge and this has been painful.  She reports resolution at this time. Her diabetes is diet controlled and she denies hypoglycemic episodes, numbness in extremities or visual concerns. Last eye exam was on 04/28/19 with Dr Katy Fitch. Gout has been stable with no recent flares. She is doing well on her antihypertensive and statin and has no adverse effects from her medications. She has no other acute concerns at this time  Past Medical History:  Diagnosis Date  . Anemia   . Asthma   . ESRD (end stage renal disease) ([place])    MWF  . GERD (gastroesophageal reflux disease)   . Gout   . 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 Heavy menstrual bleeding   . High cholesterol   . History of blood transfusion 02/2016- last one   1990's- several ones   . History of hiatal hernia   . History of kidney stones 1980's  . Hyperlipemia 12/07/2012  . Hypertension   . Hypothyroidism   . Multiple gastric ulcers   . Pneumonia 2012  . Pre-diabetes   . Prediabetes   . RA (rheumatoid arthritis) ([place])   . Sleep apnea    [place]- have CPAP, not able to use every night     Past Surgical History:  Procedure Laterality Date  . A/V SHUNTOGRAM N/A 09/08/2016   Procedure: A/V Shuntogram - Left Arm AV Graft;  Surgeon: Serafina Mitchell, MD;  Location: [facility];  Service: Cardiovascular;  Laterality: N/A;  . A/V SHUNTOGRAM N/A 09/02/2017   Procedure: A/V SHUNTOGRAM - Left Arm  AVG;  Surgeon: Conrad Lawtell, MD;  Location: [facility];  Service: Cardiovascular;  Laterality: N/A;  . A/V SHUNTOGRAM N/A 12/21/2017   Procedure: A/V SHUNTOGRAM - left arm;  Surgeon: Serafina Mitchell, MD;  Location: [facility];  Service: Cardiovascular;  Laterality: N/A;  . A/V SHUNTOGRAM Left 10/05/2018   Procedure: A/V SHUNTOGRAM;  Surgeon: Marty Heck, MD;  Location: [facility];  Service: Cardiovascular;  Laterality: Left;  . ABDOMINAL HYSTERECTOMY  2000  . AV FISTULA PLACEMENT Left 05/25/2016   Procedure: INSERTION OF LEFT UPPER ARM ARTERIOVENOUS (AV) LOOP GORE-TEX GRAFT ARM;  Surgeon: Elam Dutch, MD;  Location: [facility];  Service: Vascular;  Laterality: Left;  . BASCILIC VEIN TRANSPOSITION Right 12/06/2014   Procedure: FIRST STAGE BASILIC VEIN TRANSPOSITION - RIGHT;  Surgeon: Serafina Mitchell, MD;  Location: [facility];  Service: Vascular;  Laterality: Right;  . BASCILIC VEIN TRANSPOSITION Right 02/07/2015   Procedure: RIGHT ARM SECOND STAGE BASILIC VEIN TRANSPOSITION;  Surgeon: Serafina Mitchell, MD;  Location: [facility];  Service: Vascular;  Laterality: Right;  . BASCILIC VEIN TRANSPOSITION Left 04/22/2016   Procedure: FIRST STAGE BASILIC VEIN TRANSPOSITION LEFT ARM;  Surgeon: Conrad [place], MD;  Location: [facility];  Service: Vascular;  Laterality: Left;  . COLONOSCOPY W/ POLYPECTOMY    .  ECTOPIC PREGNANCY SURGERY  02/1982  . EXCHANGE OF A DIALYSIS CATHETER Right 04/22/2016   Procedure: EXCHANGE OF A DIALYSIS CATHETER - INSERTION RIGHT INTERNAL JUGULAR & REMOVAL FROM LEFT INTERNAL JUGULAR;  Surgeon: Conrad DuPage, MD;  Location: [facility];  Service: Vascular;  Laterality: Right;  . I & D EXTREMITY Right 02/07/2015   Procedure: IRRIGATION AND DEBRIDEMENT RIGHT ARM HEMATOMA;  Surgeon: Serafina Mitchell, MD;  Location: [facility];  Service: Vascular;  Laterality: Right;  . INSERTION OF DIALYSIS CATHETER  03/03/2016   Procedure: INSERTION OF DIALYSIS CATHETER;  Surgeon: Elam Dutch,  MD;  Location: [facility];  Service: Vascular;;  . IR AV DIALY SHUNT INTRO NEEDLE/INTRACATH INITIAL W/PTA/IMG LEFT  04/20/2017  . IR AV DIALY SHUNT INTRO NEEDLE/INTRACATH INITIAL W/PTA/IMG LEFT  06/24/2017  . IR THROMBECTOMY AV FISTULA W/THROMBOLYSIS/PTA INC/SHUNT/IMG LEFT Left 01/04/2017  . IR US GUIDE VASC ACCESS LEFT  01/04/2017  . left shoulder surgery  08/2005  . LIGATION OF ARTERIOVENOUS  FISTULA  03/03/2016   Procedure: LIGATION OF ARTERIOVENOUS  FISTULA;  Surgeon: Elam Dutch, MD;  Location: [facility];  Service: Vascular;;  . LIGATION OF ARTERIOVENOUS  FISTULA Right 09/06/2018   Procedure: Resection of pseudoaneursym and end to end repair of right brachial artery;  Surgeon: Elam Dutch, MD;  Location[facility] OR;  Service: Vascular;  Laterality: Right;  . PERIPHERAL VASCULAR BALLOON ANGIOPLASTY  09/08/2016   Procedure: Peripheral Vascular Balloon Angioplasty;  Surgeon: Serafina Mitchell, MD;  Location: [facility];  Service: Cardiovascular;;  left avf  . PERIPHERAL VASCULAR BALLOON ANGIOPLASTY  09/02/2017   Procedure: PERIPHERAL VASCULAR BALLOON ANGIOPLASTY;  Surgeon: Conrad [place], MD;  Location: [facility];  Service: Cardiovascular;;  left AV Graft  . PERIPHERAL VASCULAR BALLOON ANGIOPLASTY  12/21/2017   Procedure: PERIPHERAL VASCULAR BALLOON ANGIOPLASTY;  Surgeon: Serafina Mitchell, MD;  Location: [facility];  Service: Cardiovascular;;  INOMINATE / UPPER ARM AV GRAFT  . PERIPHERAL VASCULAR BALLOON ANGIOPLASTY Left 05/12/2018   Procedure: PERIPHERAL VASCULAR BALLOON ANGIOPLASTY;  Surgeon: Marty Heck, MD;  Location: [facility];  Service: Cardiovascular;  Laterality: Left;  Arm fistula  . PERIPHERAL VASCULAR BALLOON ANGIOPLASTY Left 10/05/2018   Procedure: PERIPHERAL VASCULAR BALLOON ANGIOPLASTY;  Surgeon: Marty Heck, MD;  Location: [facility];  Service: Cardiovascular;  Laterality: Left;  central and peripheral vein  . PERIPHERAL VASCULAR  BALLOON ANGIOPLASTY Left 04/18/2019   Procedure: PERIPHERAL VASCULAR BALLOON ANGIOPLASTY;  Surgeon: Serafina Mitchell, MD;  Location: [facility];  Service: Cardiovascular;  Laterality: Left;  arm fistula   . TEE WITHOUT CARDIOVERSION N/A 03/06/2016   Procedure: TRANSESOPHAGEAL ECHOCARDIOGRAM (TEE);  Surgeon: Sueanne Margarita, MD;  Location[facility];  Service: Cardiovascular;  Laterality: N/A;  . TUBAL LIGATION  11/1986    Family History  Problem Relation Age of Onset  . Hypertension Mother   . Breast cancer Maternal Aunt   . Deep vein thrombosis Daughter   . Hyperlipidemia Daughter   . Hypertension Daughter   . Prostate cancer Maternal Grandmother     Allergies  Allergen Reactions  . Atacand [Candesartan] Anaphylaxis and Other (See Comments)    Swelling of the mouth and tongue.  Marland Kitchen Lisinopril Anaphylaxis and Other (See Comments)    Swelling of the mouth and tongue.  . Nsaids Anaphylaxis and Other (See Comments)    Swelling of the mouth and tongue.  Marland Kitchen Penicillins Rash and Other (See Comments)  Has patient had a PCN reaction causing immediate rash, facial/tongue/throat swelling, SOB or lightheadedness with hypotension: No Has patient had a PCN reaction causing severe rash involving mucus membranes or skin necrosis: No Has patient had a PCN reaction that required hospitalization No Has patient had a PCN reaction occurring within the last 10 years: No If all of the above answers are "NO", then may proceed with Cephalosporin use.     Outpatient Medications Prior to Visit  Medication Sig Dispense Refill  . acetaminophen (TYLENOL) 650 MG CR tablet Take 650-1,300 mg by mouth every 8 (eight) hours as needed for pain.    Marland Kitchen albuterol (PROVENTIL) (2.5 MG/3ML) 0.083% nebulizer solution Take 3 mLs (2.5 mg total) by nebulization every 6 (six) hours as needed for wheezing or shortness of breath. ICD 10:J45.20 75 mL 0  . albuterol (VENTOLIN HFA) 108 (90 Base) MCG/ACT inhaler INHALE 2  PUFFS BY MOUTH EVERY 6 HOURS AS NEEDED FOR WHEEZE OR SHORTNESS OF BREATH 18 g 0  . allopurinol (ZYLOPRIM) 100 MG tablet Take 1 tablet (100 mg total) by mouth 2 (two) times daily. (Patient taking differently: Take 100 mg by mouth daily as needed (gout). ) 60 tablet 6  . amLODipine (NORVASC) 10 MG tablet TAKE 1 TABLET BY MOUTH EVERYDAY AT BEDTIME. Must keep upcoming office visit for refills 30 tablet 0  . atorvastatin (LIPITOR) 80 MG tablet Take 1 tablet (80 mg total) by mouth daily. Must have office visit for refills. (Patient taking differently: Take 80 mg by mouth at bedtime. Must have office visit for refills.) 30 tablet 0  . budesonide-formoterol (SYMBICORT) 160-4.5 MCG/ACT inhaler Inhale 2 puffs into the lungs 2 (two) times daily for 1 day. 1 Inhaler 6  . carvedilol (COREG) 25 MG tablet Take 1 tablet (25 mg total) by mouth 2 (two) times daily with a meal. 60 tablet 1  . chlorpheniramine (CHLOR-TRIMETON) 4 MG tablet Take 1 tablet (4 mg total) by mouth 3 (three) times daily. (Patient taking differently: Take 8 mg by mouth daily at 12 noon. ) 190 tablet 0  . clotrimazole (LOTRIMIN) 1 % cream Apply 1 application topically 2 (two) times daily. (Patient taking differently: Apply 1 application topically 2 (two) times daily as needed (skin irritation.). ) 30 g 1  . cyclobenzaprine (FLEXERIL) 10 MG tablet Take 1 tablet (10 mg total) by mouth 2 (two) times daily as needed. For back pain 60 tablet 2  . famotidine (PEPCID) 20 MG tablet Take 1 tablet (20 mg total) by mouth 2 (two) times daily. 180 tablet 1  . fluticasone (FLONASE) 50 MCG/ACT nasal spray SPRAY 2 SPRAYS INTO EACH NOSTRIL EVERY DAY (Patient taking differently: Place 2 sprays into both nostrils daily. ) 16 mL 2  . gabapentin (NEURONTIN) 300 MG capsule TAKE 1 CAPSULE BY MOUTH TWICE A DAY 180 capsule 1  . hydrALAZINE (APRESOLINE) 25 MG tablet Take 25 mg by mouth 3 (three) times daily.     . meclizine (ANTIVERT) 25 MG tablet Take 25 mg by mouth 3  (three) times daily as needed for dizziness.    . multivitamin (RENA-VIT) TABS tablet Take 1 tablet by mouth daily with lunch.     . pantoprazole (PROTONIX) 40 MG tablet TAKE 1 TABLET BY MOUTH EVERY DAY (Patient taking differently: Take 40 mg by mouth daily. ) 90 tablet 1  . VELPHORO 500 MG chewable tablet Chew 500-1,000 mg by mouth See admin instructions. Take 2 tablets (1000 mg) by mouth with each meals & take 1  tablet (500 mg) by mouth with each snack     No facility-administered medications prior to visit.     ROS Review of Systems  Constitutional: Negative for activity change, appetite change and fatigue.  HENT: Negative for congestion, sinus pressure and sore throat.   Eyes: Negative for visual disturbance.  Respiratory: Negative for cough, chest tightness, shortness of breath and wheezing.   Cardiovascular: Negative for chest pain and palpitations.  Gastrointestinal: Negative for abdominal distention, abdominal pain and constipation.  Endocrine: Negative for polydipsia.  Genitourinary: Negative for dysuria and frequency.  Musculoskeletal: Negative for arthralgias and back pain.  Skin: Positive for rash.  Neurological: Negative for tremors, light-headedness and numbness.  Hematological: Does not bruise/bleed easily.  Psychiatric/Behavioral: Negative for agitation and behavioral problems.    Objective:  BP (!) 146/83   Pulse 80   Ht 5\' 2"  (1.575 m)   Wt 163 lb (73.9 kg)   SpO2 97%   BMI 29.81 kg/m   BP/Weight 05/23/2019 04/18/2019 [id number]  Systolic BP [id number] [id number]  Diastolic BP 83 91 94  Wt. (Lbs) 163 160 164  BMI 29.81 29.26 30      Physical Exam Constitutional:      Appearance: She is well-developed.  Neck:     Vascular: No JVD.  Cardiovascular:     Rate and Rhythm: Normal rate.     Heart sounds: Normal heart sounds. No murmur.  Pulmonary:     Effort: Pulmonary effort is normal.     Breath sounds: Normal breath sounds. No wheezing or rales.  Chest:      Chest wall: No tenderness.  Abdominal:     General: Bowel sounds are normal. There is no distension.     Palpations: Abdomen is soft. There is no mass.     Tenderness: There is no abdominal tenderness.  Musculoskeletal:        General: Normal range of motion.     Right lower leg: No edema.     Left lower leg: No edema.  Neurological:     Mental Status: She is alert and oriented to person, place, and time.  Psychiatric:        Mood and Affect: Mood normal.     CMP Latest Ref Rng & Units 04/18/2019 10/05/2018 09/06/2018  Glucose 70 - 99 mg/dL 87 88 88  BUN 8 - 23 mg/dL 29(H) 56(H) -  Creatinine 0.44 - 1.00 mg/dL 7.00(H) 9.60(H) -  Sodium 135 - 145 mmol/L 138 137 138  Potassium 3.5 - 5.1 mmol/L 4.3 4.7 4.8  Chloride 98 - 111 mmol/L 98 100 -  CO2 22 - 32 mmol/L - - -  Calcium 8.9 - 10.3 mg/dL - - -  Total Protein 6.5 - 8.1 g/dL - - -  Total Bilirubin 0.3 - 1.2 mg/dL - - -  Alkaline Phos 38 - 126 U/L - - -  AST 15 - 41 U/L - - -  ALT 0 - 44 U/L - - -    Lipid Panel     Component Value Date/Time   CHOL 203 (H) 05/26/2018 0755   TRIG 200 (H) 05/26/2018 0755   HDL 40 05/26/2018 0755   CHOLHDL 5.1 (H) 05/26/2018 0755   CHOLHDL 4.6 09/02/2015 0958   VLDL 60 (H) 09/02/2015 0958   LDLCALC 123 (H) 05/26/2018 0755   LDLDIRECT 130.7 04/05/2013 0850    CBC    Component Value Date/Time   WBC 7.9 02/11/2018 1238   RBC 3.93 02/11/2018 1238  HGB 11.9 (L) 04/18/2019 0928   HCT 35.0 (L) 04/18/2019 0928   PLT 178 02/11/2018 1238   MCV 77.6 (L) 02/11/2018 1238   MCH 24.7 (L) 02/11/2018 1238   MCHC 31.8 02/11/2018 1238   RDW 17.2 (H) 02/11/2018 1238   LYMPHSABS 1.7 02/11/2018 1238   MONOABS 0.5 02/11/2018 1238   EOSABS 0.1 02/11/2018 1238   BASOSABS 0.0 02/11/2018 1238    Lab Results  Component Value Date   HGBA1C 5.4 05/24/2018    Assessment & Plan:   1. Type 2 diabetes mellitus with chronic kidney disease on chronic dialysis, without long-term current use of insulin  (HCC) Diet controlled with A1c of 5.4 Continue with diet control, lifestyle modifications Continue hemodialysis as per schedule Currently awaiting organ transplant - atorvastatin (LIPITOR) 80 MG tablet; Take 1 tablet (80 mg total) by mouth daily.  Dispense: 30 tablet; Refill: 6 - POCT glycosylated hemoglobin (Hb A1C)  2. Idiopathic chronic gout of multiple sites without tophus No acute exacerbations Low purine eating plan - allopurinol (ZYLOPRIM) 100 MG tablet; Take 1 tablet (100 mg total) by mouth daily.  Dispense: 30 tablet; Refill: 6  3. Essential hypertension, benign Slightly elevated blood pressure Permissive hypertension to prevent hypotension during hemodialysis Counseled on blood pressure goal of less than 130/80, low-sodium, DASH diet, medication compliance, 150 minutes of moderate intensity exercise per week. Discussed medication compliance, adverse effects. - amLODipine (NORVASC) 10 MG tablet; TAKE 1 TABLET BY MOUTH EVERYDAY AT BEDTIME.  Dispense: 30 tablet; Refill: 6 - carvedilol (COREG) 25 MG tablet; Take 1 tablet (25 mg total) by mouth 2 (two) times daily with a meal.  Dispense: 60 tablet; Refill: 6     Charlott Rakes, MD, FAAFP. [facility] [place], [place]   05/23/2019, 8:45 AM

## 2019-05-23 NOTE — Patient Instructions (Signed)
Diabetes Mellitus and Foot Care Foot care is an important part of your health, especially when you have diabetes. Diabetes may cause you to have problems because of poor blood flow (circulation) to your feet and legs, which can cause your skin to:  Become thinner and drier.  Break more easily.  Heal more slowly.  Peel and crack. You may also have nerve damage (neuropathy) in your legs and feet, causing decreased feeling in them. This means that you may not notice minor injuries to your feet that could lead to more serious problems. Noticing and addressing any potential problems early is the best way to prevent future foot problems. How to care for your feet Foot hygiene  Wash your feet daily with warm water and mild soap. Do not use hot water. Then, pat your feet and the areas between your toes until they are completely dry. Do not soak your feet as this can dry your skin.  Trim your toenails straight across. Do not dig under them or around the cuticle. File the edges of your nails with an emery board or nail file.  Apply a moisturizing lotion or petroleum jelly to the skin on your feet and to dry, brittle toenails. Use lotion that does not contain alcohol and is unscented. Do not apply lotion between your toes. Shoes and socks  Wear clean socks or stockings every day. Make sure they are not too tight. Do not wear knee-high stockings since they may decrease blood flow to your legs.  Wear shoes that fit properly and have enough cushioning. Always look in your shoes before you put them on to be sure there are no objects inside.  To break in new shoes, wear them for just a few hours a day. This prevents injuries on your feet. Wounds, scrapes, corns, and calluses  Check your feet daily for blisters, cuts, bruises, sores, and redness. If you cannot see the bottom of your feet, use a mirror or ask someone for help.  Do not cut corns or calluses or try to remove them with medicine.  If you  find a minor scrape, cut, or break in the skin on your feet, keep it and the skin around it clean and dry. You may clean these areas with mild soap and water. Do not clean the area with peroxide, alcohol, or iodine.  If you have a wound, scrape, corn, or callus on your foot, look at it several times a day to make sure it is healing and not infected. Check for: ? Redness, swelling, or pain. ? Fluid or blood. ? Warmth. ? Pus or a bad smell. General instructions  Do not cross your legs. This may decrease blood flow to your feet.  Do not use heating pads or hot water bottles on your feet. They may burn your skin. If you have lost feeling in your feet or legs, you may not know this is happening until it is too late.  Protect your feet from hot and cold by wearing shoes, such as at the beach or on hot pavement.  Schedule a complete foot exam at least once a year (annually) or more often if you have foot problems. If you have foot problems, report any cuts, sores, or bruises to your health care provider immediately. Contact a health care provider if:  You have a medical condition that increases your risk of infection and you have any cuts, sores, or bruises on your feet.  You have an injury that is not   healing.  You have redness on your legs or feet.  You feel burning or tingling in your legs or feet.  You have pain or cramps in your legs and feet.  Your legs or feet are numb.  Your feet always feel cold.  You have pain around a toenail. Get help right away if:  You have a wound, scrape, corn, or callus on your foot and: ? You have pain, swelling, or redness that gets worse. ? You have fluid or blood coming from the wound, scrape, corn, or callus. ? Your wound, scrape, corn, or callus feels warm to the touch. ? You have pus or a bad smell coming from the wound, scrape, corn, or callus. ? You have a fever. ? You have a red line going up your leg. Summary  Check your feet every day  for cuts, sores, red spots, swelling, and blisters.  Moisturize feet and legs daily.  Wear shoes that fit properly and have enough cushioning.  If you have foot problems, report any cuts, sores, or bruises to your health care provider immediately.  Schedule a complete foot exam at least once a year (annually) or more often if you have foot problems. This information is not intended to replace advice given to you by your health care provider. Make sure you discuss any questions you have with your health care provider. Document Revised: 11/30/2018 Document Reviewed: 04/10/2016 Elsevier Patient Education  2020 [employer]  

## 2019-05-25 ENCOUNTER — Other Ambulatory Visit: Payer: Self-pay | Admitting: *Deleted

## 2019-05-25 NOTE — Patient Outreach (Signed)
[facility]) Care Management  05/25/2019  Rachael George [birth year] [id number]   Unsuccessful outreach attempt made to patient. RN Health Coach left HIPAA compliant voicemail message along with her contact information.  Plan: RN Health Coach will call patient within the month of April  [place], [place] [phone] [email or web address]

## 2019-05-29 DIAGNOSIS — K219 Gastro-esophageal reflux disease without esophagitis: Secondary | ICD-10-CM | POA: Diagnosis not present

## 2019-06-07 DIAGNOSIS — E1129 Type 2 diabetes mellitus with other diabetic kidney complication: Secondary | ICD-10-CM | POA: Diagnosis not present

## 2019-06-15 ENCOUNTER — Other Ambulatory Visit: Payer: Self-pay | Admitting: Family Medicine

## 2019-06-15 DIAGNOSIS — J4520 Mild intermittent asthma, uncomplicated: Secondary | ICD-10-CM

## 2019-06-22 DIAGNOSIS — N186 End stage renal disease: Secondary | ICD-10-CM | POA: Diagnosis not present

## 2019-06-22 DIAGNOSIS — Z992 Dependence on renal dialysis: Secondary | ICD-10-CM | POA: Diagnosis not present

## 2019-06-22 DIAGNOSIS — I129 Hypertensive chronic kidney disease with stage 1 through stage 4 chronic kidney disease, or unspecified chronic kidney disease: Secondary | ICD-10-CM | POA: Diagnosis not present

## 2019-06-23 DIAGNOSIS — E1129 Type 2 diabetes mellitus with other diabetic kidney complication: Secondary | ICD-10-CM | POA: Diagnosis not present

## 2019-06-23 DIAGNOSIS — Z23 Encounter for immunization: Secondary | ICD-10-CM | POA: Diagnosis not present

## 2019-06-23 DIAGNOSIS — N186 End stage renal disease: Secondary | ICD-10-CM | POA: Diagnosis not present

## 2019-06-23 DIAGNOSIS — D631 Anemia in chronic kidney disease: Secondary | ICD-10-CM | POA: Diagnosis not present

## 2019-06-23 DIAGNOSIS — N2581 Secondary hyperparathyroidism of renal origin: Secondary | ICD-10-CM | POA: Diagnosis not present

## 2019-06-23 DIAGNOSIS — Z992 Dependence on renal dialysis: Secondary | ICD-10-CM | POA: Diagnosis not present

## 2019-06-27 DIAGNOSIS — Z992 Dependence on renal dialysis: Secondary | ICD-10-CM | POA: Diagnosis not present

## 2019-06-27 DIAGNOSIS — N186 End stage renal disease: Secondary | ICD-10-CM | POA: Diagnosis not present

## 2019-06-27 DIAGNOSIS — D631 Anemia in chronic kidney disease: Secondary | ICD-10-CM | POA: Diagnosis not present

## 2019-06-27 DIAGNOSIS — Z23 Encounter for immunization: Secondary | ICD-10-CM | POA: Diagnosis not present

## 2019-06-27 DIAGNOSIS — N2581 Secondary hyperparathyroidism of renal origin: Secondary | ICD-10-CM | POA: Diagnosis not present

## 2019-06-27 DIAGNOSIS — E1129 Type 2 diabetes mellitus with other diabetic kidney complication: Secondary | ICD-10-CM | POA: Diagnosis not present

## 2019-06-28 DIAGNOSIS — Z23 Encounter for immunization: Secondary | ICD-10-CM | POA: Diagnosis not present

## 2019-06-28 DIAGNOSIS — D631 Anemia in chronic kidney disease: Secondary | ICD-10-CM | POA: Diagnosis not present

## 2019-06-28 DIAGNOSIS — N2581 Secondary hyperparathyroidism of renal origin: Secondary | ICD-10-CM | POA: Diagnosis not present

## 2019-06-28 DIAGNOSIS — Z992 Dependence on renal dialysis: Secondary | ICD-10-CM | POA: Diagnosis not present

## 2019-06-28 DIAGNOSIS — N186 End stage renal disease: Secondary | ICD-10-CM | POA: Diagnosis not present

## 2019-06-28 DIAGNOSIS — E1129 Type 2 diabetes mellitus with other diabetic kidney complication: Secondary | ICD-10-CM | POA: Diagnosis not present

## 2019-06-29 ENCOUNTER — Other Ambulatory Visit: Payer: Self-pay | Admitting: *Deleted

## 2019-06-29 NOTE — Patient Outreach (Signed)
[facility]) Care Management  06/29/2019   Rachael George 1955/05/15 [id number]  Subjective:  Successful telephone outreach call to patient. HIPAA identifiers obtained.  Patient reports she is doing well. She continues to go to dialysis M-W-F and states she is tolerating this well. She is on the kidney transplant waiting list. Patient states that her B/P is taking frequently during dialysis and that she takes her B/P at home on the off days usually twice. Her last home value was 140/83 and she stated the highest recent value was 170/93. Nurse educated that the patient's B/P will vary greatly during dialysis and that she would not be able to tell if her B/P is under control unless she takes it daily either hours before or hours after dialysis and record the value to be able to view a trend of where her blood pressure is running. Patient verbalized understanding and stated she would do her best to take her B/P before she takes her medication at night. She will place her blood pressure cuff by her medications as a reminder. Patient is doing well with decreasing her sodium intake. She has started to use spices instead of salt and she is reading food labels for sodium content. Patient stated she wants to limit her daily sodium intake to < 2000 mg daily. She walks around [place] weekly for about an hour as she shops and is motivated to increase her physical activity to further assist with lowering her blood pressure. Patient's acid reflux has been flaring up; nurse provided education and will send patient an educational resource regarding food choices and acid reflux. Patient denies any recent falls and explains that she has no other concerns at this time.    Current Medications:  Current Outpatient Medications  Medication Sig Dispense Refill  . pantoprazole (PROTONIX) 40 MG tablet Take 40 mg by mouth 2 (two) times daily.     Marland Kitchen acetaminophen (TYLENOL) 650 MG CR tablet Take 650-1,300 mg by  mouth every 8 (eight) hours as needed for pain.    Marland Kitchen albuterol (PROVENTIL) (2.5 MG/3ML) 0.083% nebulizer solution Take 3 mLs (2.5 mg total) by nebulization every 6 (six) hours as needed for wheezing or shortness of breath. ICD 10:J45.20 75 mL 0  . albuterol (VENTOLIN HFA) 108 (90 Base) MCG/ACT inhaler INHALE 2 PUFFS BY MOUTH EVERY 6 HOURS AS NEEDED FOR WHEEZE OR SHORTNESS OF BREATH 54 g 0  . allopurinol (ZYLOPRIM) 100 MG tablet Take 1 tablet (100 mg total) by mouth daily. 30 tablet 6  . amLODipine (NORVASC) 10 MG tablet TAKE 1 TABLET BY MOUTH EVERYDAY AT BEDTIME. 30 tablet 6  . atorvastatin (LIPITOR) 80 MG tablet Take 1 tablet (80 mg total) by mouth daily. 30 tablet 6  . budesonide-formoterol (SYMBICORT) 160-4.5 MCG/ACT inhaler Inhale 2 puffs into the lungs 2 (two) times daily for 1 day. 1 Inhaler 6  . carvedilol (COREG) 25 MG tablet Take 1 tablet (25 mg total) by mouth 2 (two) times daily with a meal. 60 tablet 6  . chlorpheniramine (CHLOR-TRIMETON) 4 MG tablet Take 1 tablet (4 mg total) by mouth 3 (three) times daily. (Patient taking differently: Take 8 mg by mouth daily at 12 noon. ) 190 tablet 0  . clotrimazole (LOTRIMIN) 1 % cream Apply 1 application topically 2 (two) times daily. (Patient taking differently: Apply 1 application topically 2 (two) times daily as needed (skin irritation.). ) 30 g 1  . cyclobenzaprine (FLEXERIL) 10 MG tablet Take 1 tablet (10 mg total)  by mouth 2 (two) times daily as needed. For back pain 60 tablet 2  . famotidine (PEPCID) 20 MG tablet Take 1 tablet (20 mg total) by mouth 2 (two) times daily. (Patient not taking: Reported on 06/29/2019) 180 tablet 1  . fluticasone (FLONASE) 50 MCG/ACT nasal spray SPRAY 2 SPRAYS INTO EACH NOSTRIL EVERY DAY (Patient taking differently: Place 2 sprays into both nostrils daily. ) 16 mL 2  . gabapentin (NEURONTIN) 300 MG capsule TAKE 1 CAPSULE BY MOUTH TWICE A DAY 180 capsule 1  . hydrALAZINE (APRESOLINE) 25 MG tablet Take 25 mg by mouth 3  (three) times daily.     . meclizine (ANTIVERT) 25 MG tablet Take 25 mg by mouth 3 (three) times daily as needed for dizziness.    . multivitamin (RENA-VIT) TABS tablet Take 1 tablet by mouth daily with lunch.     . VELPHORO 500 MG chewable tablet Chew 500-1,000 mg by mouth See admin instructions. Take 2 tablets (1000 mg) by mouth with each meals & take 1 tablet (500 mg) by mouth with each snack     No current facility-administered medications for this visit.    Functional Status:  In your present state of health, do you have any difficulty performing the following activities: 04/27/2019 09/06/2018  Hearing? N N  Vision? Y N  Comment reports blurry vision and floater, has ophthalmologist appnt 04/28/19 -  Difficulty concentrating or making decisions? N N  Walking or climbing stairs? N N  Dressing or bathing? N N  Doing errands, shopping? N -  Preparing Food and eating ? N -  Using the Toilet? N -  In the past six months, have you accidently leaked urine? N -  Do you have problems with loss of bowel control? N -  Managing your Medications? N -  Managing your Finances? N -  Housekeeping or managing your Housekeeping? N -  Some recent data might be hidden    Fall/Depression Screening: Fall Risk  06/29/2019 05/23/2019 04/27/2019  Falls in the past year? 0 0 0  Number falls in past yr: 0 - 0  Injury with Fall? 0 - 0  Risk for fall due to : Other (Comment) - Impaired balance/gait;Impaired mobility;Impaired vision  Risk for fall due to: Comment gets dizzy at times with dialysis - gets dizzy at times during dialysis  Follow up Falls prevention discussed;Education provided;Falls evaluation completed - Falls prevention discussed;Education provided;Falls evaluation completed   PHQ 2/9 Scores 06/29/2019 04/27/2019 04/20/2019 04/20/2019 05/24/2018 11/23/2017 05/13/2017  PHQ - 2 Score 0 0 0 0 0 0 0  PHQ- 9 Score - - - - 0 0 1   THN CM Care Plan Problem One     Most Recent Value  Care Plan Problem One   Knowledge Deficit of hypertension management related to wide ranges of B/P values and diet adherence  Role Documenting the Problem One  [place] for Problem One  Active  THN Long Term Goal   Patient will read food labels for sodium content and limit sodium between 1500-2000 mg daily for the next 90 days  Interventions for Problem One Long Term Goal  Reviewed recommended sodium levels with patient, disussed reading labels and buying no salt or reduce salt products, discussed portion control with salty foods and snacks, encouraged patient to count the amount of her sodium intake daily, encouraged continuation of no salt or low salt seasonings  THN CM Short Term Goal #1   Patient will exercise  2 times weekly within the next 30 days  Interventions for Short Term Goal #1  Nurse provided [facility] television exercise program resource for seniors, nurse will send chair exercise printouts, nurse encouraged patient to continue to walk around [employer] weekly when she is shopping, patient states she will try to start walking her dog.  THN CM Short Term Goal #2   Patient will take her blood pressure daily and record the values within the next 30 days  THN CM Short Term Goal #2 Start Date  06/29/19  Interventions for Short Term Goal #2  Nurse discussed the importance of knowing daily B/P values so that the patient can determine if her blood pressure is controlled, Encouraged patient to take B/P at the same time daily, suggested that patient leave her blood pressure cuff by patient's medication as a reminder, nurse will send calendar booklet       Plan: [place] will send education regarding acid reflux and food choices, chair exercise printouts, and calendar booklet. RN Health Coach will call patient within the month of June and patient agrees to future outreach calls.   Emelia Loron RN, BSN [place] [phone] [email or web address]

## 2019-06-30 DIAGNOSIS — E1129 Type 2 diabetes mellitus with other diabetic kidney complication: Secondary | ICD-10-CM | POA: Diagnosis not present

## 2019-06-30 DIAGNOSIS — N2581 Secondary hyperparathyroidism of renal origin: Secondary | ICD-10-CM | POA: Diagnosis not present

## 2019-06-30 DIAGNOSIS — N186 End stage renal disease: Secondary | ICD-10-CM | POA: Diagnosis not present

## 2019-06-30 DIAGNOSIS — Z992 Dependence on renal dialysis: Secondary | ICD-10-CM | POA: Diagnosis not present

## 2019-06-30 DIAGNOSIS — Z23 Encounter for immunization: Secondary | ICD-10-CM | POA: Diagnosis not present

## 2019-06-30 DIAGNOSIS — D631 Anemia in chronic kidney disease: Secondary | ICD-10-CM | POA: Diagnosis not present

## 2019-07-05 DIAGNOSIS — E1129 Type 2 diabetes mellitus with other diabetic kidney complication: Secondary | ICD-10-CM | POA: Diagnosis not present

## 2019-07-05 DIAGNOSIS — D631 Anemia in chronic kidney disease: Secondary | ICD-10-CM | POA: Diagnosis not present

## 2019-07-05 DIAGNOSIS — N186 End stage renal disease: Secondary | ICD-10-CM | POA: Diagnosis not present

## 2019-07-05 DIAGNOSIS — Z992 Dependence on renal dialysis: Secondary | ICD-10-CM | POA: Diagnosis not present

## 2019-07-05 DIAGNOSIS — Z23 Encounter for immunization: Secondary | ICD-10-CM | POA: Diagnosis not present

## 2019-07-05 DIAGNOSIS — N2581 Secondary hyperparathyroidism of renal origin: Secondary | ICD-10-CM | POA: Diagnosis not present

## 2019-07-07 DIAGNOSIS — N2581 Secondary hyperparathyroidism of renal origin: Secondary | ICD-10-CM | POA: Diagnosis not present

## 2019-07-07 DIAGNOSIS — N186 End stage renal disease: Secondary | ICD-10-CM | POA: Diagnosis not present

## 2019-07-07 DIAGNOSIS — Z992 Dependence on renal dialysis: Secondary | ICD-10-CM | POA: Diagnosis not present

## 2019-07-07 DIAGNOSIS — E1129 Type 2 diabetes mellitus with other diabetic kidney complication: Secondary | ICD-10-CM | POA: Diagnosis not present

## 2019-07-07 DIAGNOSIS — D631 Anemia in chronic kidney disease: Secondary | ICD-10-CM | POA: Diagnosis not present

## 2019-07-07 DIAGNOSIS — Z23 Encounter for immunization: Secondary | ICD-10-CM | POA: Diagnosis not present

## 2019-07-10 DIAGNOSIS — Z23 Encounter for immunization: Secondary | ICD-10-CM | POA: Diagnosis not present

## 2019-07-10 DIAGNOSIS — E1129 Type 2 diabetes mellitus with other diabetic kidney complication: Secondary | ICD-10-CM | POA: Diagnosis not present

## 2019-07-10 DIAGNOSIS — D631 Anemia in chronic kidney disease: Secondary | ICD-10-CM | POA: Diagnosis not present

## 2019-07-10 DIAGNOSIS — Z992 Dependence on renal dialysis: Secondary | ICD-10-CM | POA: Diagnosis not present

## 2019-07-10 DIAGNOSIS — N2581 Secondary hyperparathyroidism of renal origin: Secondary | ICD-10-CM | POA: Diagnosis not present

## 2019-07-10 DIAGNOSIS — N186 End stage renal disease: Secondary | ICD-10-CM | POA: Diagnosis not present

## 2019-07-12 DIAGNOSIS — E1129 Type 2 diabetes mellitus with other diabetic kidney complication: Secondary | ICD-10-CM | POA: Diagnosis not present

## 2019-07-12 DIAGNOSIS — Z992 Dependence on renal dialysis: Secondary | ICD-10-CM | POA: Diagnosis not present

## 2019-07-12 DIAGNOSIS — N2581 Secondary hyperparathyroidism of renal origin: Secondary | ICD-10-CM | POA: Diagnosis not present

## 2019-07-12 DIAGNOSIS — N186 End stage renal disease: Secondary | ICD-10-CM | POA: Diagnosis not present

## 2019-07-12 DIAGNOSIS — D631 Anemia in chronic kidney disease: Secondary | ICD-10-CM | POA: Diagnosis not present

## 2019-07-12 DIAGNOSIS — Z23 Encounter for immunization: Secondary | ICD-10-CM | POA: Diagnosis not present

## 2019-07-14 DIAGNOSIS — D631 Anemia in chronic kidney disease: Secondary | ICD-10-CM | POA: Diagnosis not present

## 2019-07-14 DIAGNOSIS — E1129 Type 2 diabetes mellitus with other diabetic kidney complication: Secondary | ICD-10-CM | POA: Diagnosis not present

## 2019-07-14 DIAGNOSIS — Z992 Dependence on renal dialysis: Secondary | ICD-10-CM | POA: Diagnosis not present

## 2019-07-14 DIAGNOSIS — N2581 Secondary hyperparathyroidism of renal origin: Secondary | ICD-10-CM | POA: Diagnosis not present

## 2019-07-14 DIAGNOSIS — N186 End stage renal disease: Secondary | ICD-10-CM | POA: Diagnosis not present

## 2019-07-14 DIAGNOSIS — Z23 Encounter for immunization: Secondary | ICD-10-CM | POA: Diagnosis not present

## 2019-07-17 DIAGNOSIS — D631 Anemia in chronic kidney disease: Secondary | ICD-10-CM | POA: Diagnosis not present

## 2019-07-17 DIAGNOSIS — N2581 Secondary hyperparathyroidism of renal origin: Secondary | ICD-10-CM | POA: Diagnosis not present

## 2019-07-17 DIAGNOSIS — Z992 Dependence on renal dialysis: Secondary | ICD-10-CM | POA: Diagnosis not present

## 2019-07-17 DIAGNOSIS — Z23 Encounter for immunization: Secondary | ICD-10-CM | POA: Diagnosis not present

## 2019-07-17 DIAGNOSIS — N186 End stage renal disease: Secondary | ICD-10-CM | POA: Diagnosis not present

## 2019-07-17 DIAGNOSIS — E1129 Type 2 diabetes mellitus with other diabetic kidney complication: Secondary | ICD-10-CM | POA: Diagnosis not present

## 2019-07-21 DIAGNOSIS — Z23 Encounter for immunization: Secondary | ICD-10-CM | POA: Diagnosis not present

## 2019-07-21 DIAGNOSIS — Z992 Dependence on renal dialysis: Secondary | ICD-10-CM | POA: Diagnosis not present

## 2019-07-21 DIAGNOSIS — N186 End stage renal disease: Secondary | ICD-10-CM | POA: Diagnosis not present

## 2019-07-21 DIAGNOSIS — E1129 Type 2 diabetes mellitus with other diabetic kidney complication: Secondary | ICD-10-CM | POA: Diagnosis not present

## 2019-07-21 DIAGNOSIS — D631 Anemia in chronic kidney disease: Secondary | ICD-10-CM | POA: Diagnosis not present

## 2019-07-21 DIAGNOSIS — N2581 Secondary hyperparathyroidism of renal origin: Secondary | ICD-10-CM | POA: Diagnosis not present

## 2019-07-24 DIAGNOSIS — E1129 Type 2 diabetes mellitus with other diabetic kidney complication: Secondary | ICD-10-CM | POA: Diagnosis not present

## 2019-07-24 DIAGNOSIS — N186 End stage renal disease: Secondary | ICD-10-CM | POA: Diagnosis not present

## 2019-07-24 DIAGNOSIS — N2581 Secondary hyperparathyroidism of renal origin: Secondary | ICD-10-CM | POA: Diagnosis not present

## 2019-07-24 DIAGNOSIS — D631 Anemia in chronic kidney disease: Secondary | ICD-10-CM | POA: Diagnosis not present

## 2019-07-24 DIAGNOSIS — Z992 Dependence on renal dialysis: Secondary | ICD-10-CM | POA: Diagnosis not present

## 2019-07-26 DIAGNOSIS — Z940 Kidney transplant status: Secondary | ICD-10-CM | POA: Diagnosis not present

## 2019-07-26 DIAGNOSIS — Z7682 Awaiting organ transplant status: Secondary | ICD-10-CM | POA: Diagnosis not present

## 2019-07-26 DIAGNOSIS — E785 Hyperlipidemia, unspecified: Secondary | ICD-10-CM | POA: Diagnosis present

## 2019-07-26 DIAGNOSIS — K219 Gastro-esophageal reflux disease without esophagitis: Secondary | ICD-10-CM | POA: Diagnosis present

## 2019-07-26 DIAGNOSIS — N2581 Secondary hyperparathyroidism of renal origin: Secondary | ICD-10-CM | POA: Diagnosis not present

## 2019-07-26 DIAGNOSIS — Z833 Family history of diabetes mellitus: Secondary | ICD-10-CM | POA: Diagnosis not present

## 2019-07-26 DIAGNOSIS — Z8619 Personal history of other infectious and parasitic diseases: Secondary | ICD-10-CM | POA: Diagnosis not present

## 2019-07-26 DIAGNOSIS — N186 End stage renal disease: Secondary | ICD-10-CM | POA: Diagnosis present

## 2019-07-26 DIAGNOSIS — Z87442 Personal history of urinary calculi: Secondary | ICD-10-CM | POA: Diagnosis not present

## 2019-07-26 DIAGNOSIS — I120 Hypertensive chronic kidney disease with stage 5 chronic kidney disease or end stage renal disease: Secondary | ICD-10-CM | POA: Diagnosis present

## 2019-07-26 DIAGNOSIS — E1165 Type 2 diabetes mellitus with hyperglycemia: Secondary | ICD-10-CM | POA: Diagnosis present

## 2019-07-26 DIAGNOSIS — G8929 Other chronic pain: Secondary | ICD-10-CM | POA: Diagnosis present

## 2019-07-26 DIAGNOSIS — Z4822 Encounter for aftercare following kidney transplant: Secondary | ICD-10-CM | POA: Diagnosis not present

## 2019-07-26 DIAGNOSIS — Z7951 Long term (current) use of inhaled steroids: Secondary | ICD-10-CM | POA: Diagnosis not present

## 2019-07-26 DIAGNOSIS — Z79899 Other long term (current) drug therapy: Secondary | ICD-10-CM | POA: Diagnosis not present

## 2019-07-26 DIAGNOSIS — D849 Immunodeficiency, unspecified: Secondary | ICD-10-CM | POA: Diagnosis not present

## 2019-07-26 DIAGNOSIS — Z9483 Pancreas transplant status: Secondary | ICD-10-CM | POA: Diagnosis not present

## 2019-07-26 DIAGNOSIS — Z5181 Encounter for therapeutic drug level monitoring: Secondary | ICD-10-CM | POA: Diagnosis not present

## 2019-07-26 DIAGNOSIS — M069 Rheumatoid arthritis, unspecified: Secondary | ICD-10-CM | POA: Diagnosis present

## 2019-07-26 DIAGNOSIS — I10 Essential (primary) hypertension: Secondary | ICD-10-CM | POA: Diagnosis not present

## 2019-07-26 DIAGNOSIS — E878 Other disorders of electrolyte and fluid balance, not elsewhere classified: Secondary | ICD-10-CM | POA: Diagnosis not present

## 2019-07-26 DIAGNOSIS — E1122 Type 2 diabetes mellitus with diabetic chronic kidney disease: Secondary | ICD-10-CM | POA: Diagnosis present

## 2019-07-26 DIAGNOSIS — D631 Anemia in chronic kidney disease: Secondary | ICD-10-CM | POA: Diagnosis present

## 2019-07-26 DIAGNOSIS — Z8249 Family history of ischemic heart disease and other diseases of the circulatory system: Secondary | ICD-10-CM | POA: Diagnosis not present

## 2019-07-26 DIAGNOSIS — E1129 Type 2 diabetes mellitus with other diabetic kidney complication: Secondary | ICD-10-CM | POA: Diagnosis not present

## 2019-07-26 DIAGNOSIS — Z992 Dependence on renal dialysis: Secondary | ICD-10-CM | POA: Diagnosis not present

## 2019-07-26 DIAGNOSIS — E1121 Type 2 diabetes mellitus with diabetic nephropathy: Secondary | ICD-10-CM | POA: Diagnosis not present

## 2019-07-26 DIAGNOSIS — E119 Type 2 diabetes mellitus without complications: Secondary | ICD-10-CM | POA: Diagnosis not present

## 2019-07-26 DIAGNOSIS — J45909 Unspecified asthma, uncomplicated: Secondary | ICD-10-CM | POA: Diagnosis present

## 2019-07-26 DIAGNOSIS — G4733 Obstructive sleep apnea (adult) (pediatric): Secondary | ICD-10-CM | POA: Diagnosis present

## 2019-07-28 ENCOUNTER — Telehealth: Payer: Self-pay | Admitting: Family Medicine

## 2019-07-28 DIAGNOSIS — D849 Immunodeficiency, unspecified: Secondary | ICD-10-CM | POA: Insufficient documentation

## 2019-07-28 NOTE — Telephone Encounter (Signed)
Patient called in and requested to inform pcp that she received a kidney and she is currently in [facility] now.

## 2019-07-31 MED ORDER — ACCU-CHEK GUIDE W/DEVICE KIT: 1.0000 | PACK | Freq: Every day | 0 refills | Status: DC

## 2019-07-31 MED ORDER — ACCU-CHEK GUIDE VI STRP: ORAL_STRIP | 12 refills | Status: DC

## 2019-07-31 NOTE — Telephone Encounter (Signed)
Will route to PCP 

## 2019-07-31 NOTE — Telephone Encounter (Signed)
Thank you for the update. I spoke to her on the phone.  I also sent in a prescription which she requested for.

## 2019-08-03 DIAGNOSIS — D8989 Other specified disorders involving the immune mechanism, not elsewhere classified: Secondary | ICD-10-CM | POA: Diagnosis not present

## 2019-08-03 DIAGNOSIS — Z7952 Long term (current) use of systemic steroids: Secondary | ICD-10-CM | POA: Diagnosis not present

## 2019-08-03 DIAGNOSIS — Z792 Long term (current) use of antibiotics: Secondary | ICD-10-CM | POA: Diagnosis not present

## 2019-08-03 DIAGNOSIS — E119 Type 2 diabetes mellitus without complications: Secondary | ICD-10-CM | POA: Diagnosis not present

## 2019-08-03 DIAGNOSIS — D72829 Elevated white blood cell count, unspecified: Secondary | ICD-10-CM | POA: Diagnosis not present

## 2019-08-03 DIAGNOSIS — I120 Hypertensive chronic kidney disease with stage 5 chronic kidney disease or end stage renal disease: Secondary | ICD-10-CM | POA: Diagnosis not present

## 2019-08-03 DIAGNOSIS — Z4822 Encounter for aftercare following kidney transplant: Secondary | ICD-10-CM | POA: Diagnosis not present

## 2019-08-03 DIAGNOSIS — Z940 Kidney transplant status: Secondary | ICD-10-CM | POA: Diagnosis not present

## 2019-08-03 DIAGNOSIS — D649 Anemia, unspecified: Secondary | ICD-10-CM | POA: Diagnosis not present

## 2019-08-03 DIAGNOSIS — E1122 Type 2 diabetes mellitus with diabetic chronic kidney disease: Secondary | ICD-10-CM | POA: Diagnosis not present

## 2019-08-03 DIAGNOSIS — E872 Acidosis: Secondary | ICD-10-CM | POA: Diagnosis not present

## 2019-08-03 DIAGNOSIS — E785 Hyperlipidemia, unspecified: Secondary | ICD-10-CM | POA: Diagnosis not present

## 2019-08-03 DIAGNOSIS — Z9989 Dependence on other enabling machines and devices: Secondary | ICD-10-CM | POA: Diagnosis not present

## 2019-08-03 DIAGNOSIS — R808 Other proteinuria: Secondary | ICD-10-CM | POA: Diagnosis not present

## 2019-08-03 DIAGNOSIS — Z79899 Other long term (current) drug therapy: Secondary | ICD-10-CM | POA: Diagnosis not present

## 2019-08-03 DIAGNOSIS — I10 Essential (primary) hypertension: Secondary | ICD-10-CM | POA: Diagnosis not present

## 2019-08-03 DIAGNOSIS — M069 Rheumatoid arthritis, unspecified: Secondary | ICD-10-CM | POA: Diagnosis not present

## 2019-08-03 DIAGNOSIS — G4733 Obstructive sleep apnea (adult) (pediatric): Secondary | ICD-10-CM | POA: Diagnosis not present

## 2019-08-03 DIAGNOSIS — Z794 Long term (current) use of insulin: Secondary | ICD-10-CM | POA: Diagnosis not present

## 2019-08-03 DIAGNOSIS — N186 End stage renal disease: Secondary | ICD-10-CM | POA: Diagnosis not present

## 2019-08-04 ENCOUNTER — Other Ambulatory Visit: Payer: Self-pay | Admitting: *Deleted

## 2019-08-04 NOTE — Patient Outreach (Signed)
[facility]) Care Management  08/04/2019  Rachael George December 16, 1955 [id number]  Successful telephone outreach call to patient. HIPAA identifiers obtained. Nurse called patient in response to VM she left. Patient states that she received a kidney and had her kidney transplant at [facility]. She reports that she is doing well with recovery and is tolerating the kidney well with no rejection. Patient called to see if she could get transportation assistance. She has visits scheduled 3 times weekly for the next month at [facility] to monitor labs and due to recovery is unable to drive. She contacted Social Services who has provided transportation through [employer] but patient states they are picking her up late and she is getting to her appointments late. Patient would like to know if there are any other transportation options and reports that her next appointment is Monday 08/07/19.  Plan: RN Health Coach will send a referral to SW for transportation. RN Health Coach will call patient within the month of June and patient agrees to future outreach calls.   Emelia Loron RN, BSN [place] [phone] [email or web address]

## 2019-08-07 ENCOUNTER — Other Ambulatory Visit: Payer: Self-pay | Admitting: *Deleted

## 2019-08-07 ENCOUNTER — Telehealth: Payer: Self-pay

## 2019-08-07 DIAGNOSIS — E1165 Type 2 diabetes mellitus with hyperglycemia: Secondary | ICD-10-CM | POA: Diagnosis not present

## 2019-08-07 DIAGNOSIS — N186 End stage renal disease: Secondary | ICD-10-CM

## 2019-08-07 DIAGNOSIS — I10 Essential (primary) hypertension: Secondary | ICD-10-CM | POA: Diagnosis not present

## 2019-08-07 DIAGNOSIS — Z792 Long term (current) use of antibiotics: Secondary | ICD-10-CM | POA: Diagnosis not present

## 2019-08-07 DIAGNOSIS — Z940 Kidney transplant status: Secondary | ICD-10-CM | POA: Diagnosis not present

## 2019-08-07 DIAGNOSIS — E1122 Type 2 diabetes mellitus with diabetic chronic kidney disease: Secondary | ICD-10-CM

## 2019-08-07 DIAGNOSIS — Z7952 Long term (current) use of systemic steroids: Secondary | ICD-10-CM | POA: Diagnosis not present

## 2019-08-07 DIAGNOSIS — D849 Immunodeficiency, unspecified: Secondary | ICD-10-CM | POA: Diagnosis not present

## 2019-08-07 DIAGNOSIS — Z4822 Encounter for aftercare following kidney transplant: Secondary | ICD-10-CM | POA: Diagnosis not present

## 2019-08-07 DIAGNOSIS — Z79899 Other long term (current) drug therapy: Secondary | ICD-10-CM | POA: Insufficient documentation

## 2019-08-07 MED ORDER — ACCU-CHEK GUIDE VI STRP: ORAL_STRIP | 12 refills | Status: DC

## 2019-08-07 MED ORDER — ACCU-CHEK GUIDE W/DEVICE KIT: 1.0000 | PACK | Freq: Every day | 0 refills | Status: AC

## 2019-08-07 MED ORDER — ACCU-CHEK SOFTCLIX LANCETS MISC: 12 refills | Status: DC

## 2019-08-07 NOTE — Telephone Encounter (Signed)
Rx sent 

## 2019-08-07 NOTE — Addendum Note (Signed)
Addended by: Daisy Blossom, Annie Main L on: 08/07/2019 05:16 PM   Modules accepted: Orders

## 2019-08-07 NOTE — Patient Outreach (Signed)
[facility]) Care Management  08/07/2019  Rachael George May 14, 1955 [id number]   CSW received referral on 08/04/2019 for transportation needs.  CSW made contact with pt today and confirmed pt identity.  CSW introduced self, role and reason for call. Per pt, she is going to [facility] 2-3 weekly for labwork.  She reports she has been getting to and from these appointments (and actually was currently en-route during our call) by DSS/Medicaid transportation.  Pt has been arriving late to her appointments and reports being in the [facility] for 2 hours one day because of the [place] driver picking up others and dropping them off before her appointment time.  Pt has stitches and is finding the long, bouncy van ride uncomfortable/painful for her.  CSW encouraged pt to inquire with the driver or office staff making arrangements for a different plan if possible; she is ok with being dropped off at appointment super early as opposed to the long, bouncy rides and sometimes late arrival.  Pt called CSW back after her arrival at [facility] and was on time today- CSW again encouraged pt to see if they can make a different plan for upcoming appointments. [place] does [place] more involved for the transport company.   CSW offered to touch base on Friday to see how her Thursday ride goes.  CSW also reminded pt to call if needs arise that CSW can assist her with .   Eduard Clos, MSW, [place] Worker  [place] [phone]

## 2019-08-07 NOTE — Telephone Encounter (Signed)
Rachael George -   Have you received anything about this? If so, can we start the PA for her?

## 2019-08-07 NOTE — Telephone Encounter (Signed)
I called [facility]-they stated the rx's had to be resent with specific directions and an ICD-10 code on the rx.  3 separate rx's; monitor, test strips, lancets-can you resend?

## 2019-08-07 NOTE — Telephone Encounter (Signed)
Pt called requesting Prior auth for Medicare to pay for glucose monitor. Please send [facility].

## 2019-08-10 DIAGNOSIS — Z792 Long term (current) use of antibiotics: Secondary | ICD-10-CM | POA: Diagnosis not present

## 2019-08-10 DIAGNOSIS — E785 Hyperlipidemia, unspecified: Secondary | ICD-10-CM | POA: Diagnosis not present

## 2019-08-10 DIAGNOSIS — Z79899 Other long term (current) drug therapy: Secondary | ICD-10-CM | POA: Diagnosis not present

## 2019-08-10 DIAGNOSIS — Z7952 Long term (current) use of systemic steroids: Secondary | ICD-10-CM | POA: Diagnosis not present

## 2019-08-10 DIAGNOSIS — M069 Rheumatoid arthritis, unspecified: Secondary | ICD-10-CM | POA: Diagnosis not present

## 2019-08-10 DIAGNOSIS — Z4822 Encounter for aftercare following kidney transplant: Secondary | ICD-10-CM | POA: Diagnosis not present

## 2019-08-10 DIAGNOSIS — Z5181 Encounter for therapeutic drug level monitoring: Secondary | ICD-10-CM | POA: Diagnosis not present

## 2019-08-10 DIAGNOSIS — R571 Hypovolemic shock: Secondary | ICD-10-CM | POA: Diagnosis not present

## 2019-08-10 DIAGNOSIS — Z940 Kidney transplant status: Secondary | ICD-10-CM | POA: Diagnosis not present

## 2019-08-10 DIAGNOSIS — D649 Anemia, unspecified: Secondary | ICD-10-CM | POA: Diagnosis not present

## 2019-08-10 DIAGNOSIS — G4733 Obstructive sleep apnea (adult) (pediatric): Secondary | ICD-10-CM | POA: Diagnosis not present

## 2019-08-10 DIAGNOSIS — I120 Hypertensive chronic kidney disease with stage 5 chronic kidney disease or end stage renal disease: Secondary | ICD-10-CM | POA: Diagnosis not present

## 2019-08-10 DIAGNOSIS — D72829 Elevated white blood cell count, unspecified: Secondary | ICD-10-CM | POA: Diagnosis not present

## 2019-08-10 DIAGNOSIS — Z9989 Dependence on other enabling machines and devices: Secondary | ICD-10-CM | POA: Diagnosis not present

## 2019-08-10 DIAGNOSIS — N96 Recurrent pregnancy loss: Secondary | ICD-10-CM | POA: Diagnosis not present

## 2019-08-10 DIAGNOSIS — E872 Acidosis: Secondary | ICD-10-CM | POA: Diagnosis not present

## 2019-08-10 DIAGNOSIS — I10 Essential (primary) hypertension: Secondary | ICD-10-CM | POA: Diagnosis not present

## 2019-08-10 DIAGNOSIS — E119 Type 2 diabetes mellitus without complications: Secondary | ICD-10-CM | POA: Diagnosis not present

## 2019-08-10 DIAGNOSIS — E1122 Type 2 diabetes mellitus with diabetic chronic kidney disease: Secondary | ICD-10-CM | POA: Diagnosis not present

## 2019-08-10 DIAGNOSIS — N186 End stage renal disease: Secondary | ICD-10-CM | POA: Diagnosis not present

## 2019-08-11 ENCOUNTER — Other Ambulatory Visit: Payer: Self-pay | Admitting: *Deleted

## 2019-08-11 NOTE — Patient Outreach (Signed)
[facility]) Care Management  08/11/2019  Rachael George Nov 13, 1955 [id number]   [place] made follow up contact with pt who reports her transportation ride to [facility] went "well" on yesterday.  "They got me there early and it all went smoothly".  Pt reports she has one more week until she can drive herself. She feels comfortable with things as is and will contact CSW if needs arise.  CSW will close CSW referral and advise [facility] team and PCP of above plans.     Eduard Clos, MSW, [place] Worker  [place] [phone]

## 2019-08-14 ENCOUNTER — Other Ambulatory Visit: Payer: Self-pay | Admitting: Family Medicine

## 2019-08-14 DIAGNOSIS — I120 Hypertensive chronic kidney disease with stage 5 chronic kidney disease or end stage renal disease: Secondary | ICD-10-CM | POA: Diagnosis not present

## 2019-08-14 DIAGNOSIS — N96 Recurrent pregnancy loss: Secondary | ICD-10-CM | POA: Diagnosis not present

## 2019-08-14 DIAGNOSIS — D649 Anemia, unspecified: Secondary | ICD-10-CM | POA: Diagnosis not present

## 2019-08-14 DIAGNOSIS — N186 End stage renal disease: Secondary | ICD-10-CM | POA: Diagnosis not present

## 2019-08-14 DIAGNOSIS — K2100 Gastro-esophageal reflux disease with esophagitis, without bleeding: Secondary | ICD-10-CM

## 2019-08-14 DIAGNOSIS — M069 Rheumatoid arthritis, unspecified: Secondary | ICD-10-CM | POA: Diagnosis not present

## 2019-08-14 DIAGNOSIS — N184 Chronic kidney disease, stage 4 (severe): Secondary | ICD-10-CM | POA: Diagnosis not present

## 2019-08-14 DIAGNOSIS — E785 Hyperlipidemia, unspecified: Secondary | ICD-10-CM | POA: Diagnosis not present

## 2019-08-14 DIAGNOSIS — Z4822 Encounter for aftercare following kidney transplant: Secondary | ICD-10-CM | POA: Diagnosis not present

## 2019-08-14 DIAGNOSIS — R571 Hypovolemic shock: Secondary | ICD-10-CM | POA: Diagnosis not present

## 2019-08-14 DIAGNOSIS — D849 Immunodeficiency, unspecified: Secondary | ICD-10-CM | POA: Diagnosis not present

## 2019-08-14 DIAGNOSIS — Z7952 Long term (current) use of systemic steroids: Secondary | ICD-10-CM | POA: Diagnosis not present

## 2019-08-14 DIAGNOSIS — E1122 Type 2 diabetes mellitus with diabetic chronic kidney disease: Secondary | ICD-10-CM | POA: Diagnosis not present

## 2019-08-14 DIAGNOSIS — Z940 Kidney transplant status: Secondary | ICD-10-CM | POA: Diagnosis not present

## 2019-08-14 DIAGNOSIS — D72829 Elevated white blood cell count, unspecified: Secondary | ICD-10-CM | POA: Diagnosis not present

## 2019-08-14 DIAGNOSIS — Z792 Long term (current) use of antibiotics: Secondary | ICD-10-CM | POA: Diagnosis not present

## 2019-08-14 DIAGNOSIS — Z79899 Other long term (current) drug therapy: Secondary | ICD-10-CM | POA: Diagnosis not present

## 2019-08-14 DIAGNOSIS — E872 Acidosis: Secondary | ICD-10-CM | POA: Diagnosis not present

## 2019-08-14 DIAGNOSIS — G4733 Obstructive sleep apnea (adult) (pediatric): Secondary | ICD-10-CM | POA: Diagnosis not present

## 2019-08-17 DIAGNOSIS — E875 Hyperkalemia: Secondary | ICD-10-CM | POA: Diagnosis not present

## 2019-08-17 DIAGNOSIS — Z888 Allergy status to other drugs, medicaments and biological substances status: Secondary | ICD-10-CM | POA: Diagnosis not present

## 2019-08-17 DIAGNOSIS — E1169 Type 2 diabetes mellitus with other specified complication: Secondary | ICD-10-CM | POA: Diagnosis not present

## 2019-08-17 DIAGNOSIS — Z9989 Dependence on other enabling machines and devices: Secondary | ICD-10-CM | POA: Diagnosis not present

## 2019-08-17 DIAGNOSIS — Z79899 Other long term (current) drug therapy: Secondary | ICD-10-CM | POA: Diagnosis not present

## 2019-08-17 DIAGNOSIS — G4733 Obstructive sleep apnea (adult) (pediatric): Secondary | ICD-10-CM | POA: Diagnosis not present

## 2019-08-17 DIAGNOSIS — Z4822 Encounter for aftercare following kidney transplant: Secondary | ICD-10-CM | POA: Diagnosis not present

## 2019-08-17 DIAGNOSIS — E785 Hyperlipidemia, unspecified: Secondary | ICD-10-CM | POA: Diagnosis not present

## 2019-08-17 DIAGNOSIS — R571 Hypovolemic shock: Secondary | ICD-10-CM | POA: Diagnosis not present

## 2019-08-17 DIAGNOSIS — D849 Immunodeficiency, unspecified: Secondary | ICD-10-CM | POA: Diagnosis not present

## 2019-08-17 DIAGNOSIS — Z886 Allergy status to analgesic agent status: Secondary | ICD-10-CM | POA: Diagnosis not present

## 2019-08-17 DIAGNOSIS — D649 Anemia, unspecified: Secondary | ICD-10-CM | POA: Diagnosis not present

## 2019-08-17 DIAGNOSIS — N96 Recurrent pregnancy loss: Secondary | ICD-10-CM | POA: Diagnosis not present

## 2019-08-17 DIAGNOSIS — Z940 Kidney transplant status: Secondary | ICD-10-CM | POA: Diagnosis not present

## 2019-08-17 DIAGNOSIS — M069 Rheumatoid arthritis, unspecified: Secondary | ICD-10-CM | POA: Diagnosis not present

## 2019-08-17 DIAGNOSIS — J4521 Mild intermittent asthma with (acute) exacerbation: Secondary | ICD-10-CM | POA: Diagnosis not present

## 2019-08-17 DIAGNOSIS — I10 Essential (primary) hypertension: Secondary | ICD-10-CM | POA: Diagnosis not present

## 2019-08-17 DIAGNOSIS — Z880 Allergy status to penicillin: Secondary | ICD-10-CM | POA: Diagnosis not present

## 2019-08-17 DIAGNOSIS — Z794 Long term (current) use of insulin: Secondary | ICD-10-CM | POA: Diagnosis not present

## 2019-08-17 DIAGNOSIS — E872 Acidosis: Secondary | ICD-10-CM | POA: Diagnosis not present

## 2019-08-22 DIAGNOSIS — E119 Type 2 diabetes mellitus without complications: Secondary | ICD-10-CM | POA: Diagnosis not present

## 2019-08-22 DIAGNOSIS — I10 Essential (primary) hypertension: Secondary | ICD-10-CM | POA: Diagnosis not present

## 2019-08-22 DIAGNOSIS — Z792 Long term (current) use of antibiotics: Secondary | ICD-10-CM | POA: Diagnosis not present

## 2019-08-22 DIAGNOSIS — G4733 Obstructive sleep apnea (adult) (pediatric): Secondary | ICD-10-CM | POA: Diagnosis not present

## 2019-08-22 DIAGNOSIS — M069 Rheumatoid arthritis, unspecified: Secondary | ICD-10-CM | POA: Diagnosis not present

## 2019-08-22 DIAGNOSIS — Z4822 Encounter for aftercare following kidney transplant: Secondary | ICD-10-CM | POA: Diagnosis not present

## 2019-08-22 DIAGNOSIS — Z79899 Other long term (current) drug therapy: Secondary | ICD-10-CM | POA: Diagnosis not present

## 2019-08-22 DIAGNOSIS — E785 Hyperlipidemia, unspecified: Secondary | ICD-10-CM | POA: Diagnosis not present

## 2019-08-22 DIAGNOSIS — Z7952 Long term (current) use of systemic steroids: Secondary | ICD-10-CM | POA: Diagnosis not present

## 2019-08-22 DIAGNOSIS — E872 Acidosis: Secondary | ICD-10-CM | POA: Diagnosis not present

## 2019-08-22 DIAGNOSIS — Z940 Kidney transplant status: Secondary | ICD-10-CM | POA: Diagnosis not present

## 2019-08-22 DIAGNOSIS — T8610 Unspecified complication of kidney transplant: Secondary | ICD-10-CM | POA: Diagnosis not present

## 2019-08-22 DIAGNOSIS — D849 Immunodeficiency, unspecified: Secondary | ICD-10-CM | POA: Diagnosis not present

## 2019-08-22 DIAGNOSIS — Z9989 Dependence on other enabling machines and devices: Secondary | ICD-10-CM | POA: Diagnosis not present

## 2019-08-22 DIAGNOSIS — E875 Hyperkalemia: Secondary | ICD-10-CM | POA: Diagnosis not present

## 2019-08-22 DIAGNOSIS — D649 Anemia, unspecified: Secondary | ICD-10-CM | POA: Diagnosis not present

## 2019-08-24 DIAGNOSIS — Z792 Long term (current) use of antibiotics: Secondary | ICD-10-CM | POA: Diagnosis not present

## 2019-08-24 DIAGNOSIS — N186 End stage renal disease: Secondary | ICD-10-CM | POA: Diagnosis not present

## 2019-08-24 DIAGNOSIS — I120 Hypertensive chronic kidney disease with stage 5 chronic kidney disease or end stage renal disease: Secondary | ICD-10-CM | POA: Diagnosis not present

## 2019-08-24 DIAGNOSIS — M069 Rheumatoid arthritis, unspecified: Secondary | ICD-10-CM | POA: Diagnosis not present

## 2019-08-24 DIAGNOSIS — E785 Hyperlipidemia, unspecified: Secondary | ICD-10-CM | POA: Diagnosis not present

## 2019-08-24 DIAGNOSIS — Z7952 Long term (current) use of systemic steroids: Secondary | ICD-10-CM | POA: Diagnosis not present

## 2019-08-24 DIAGNOSIS — G4733 Obstructive sleep apnea (adult) (pediatric): Secondary | ICD-10-CM | POA: Diagnosis not present

## 2019-08-24 DIAGNOSIS — Z4822 Encounter for aftercare following kidney transplant: Secondary | ICD-10-CM | POA: Diagnosis not present

## 2019-08-24 DIAGNOSIS — Z79899 Other long term (current) drug therapy: Secondary | ICD-10-CM | POA: Diagnosis not present

## 2019-08-24 DIAGNOSIS — E1122 Type 2 diabetes mellitus with diabetic chronic kidney disease: Secondary | ICD-10-CM | POA: Diagnosis not present

## 2019-08-24 DIAGNOSIS — D649 Anemia, unspecified: Secondary | ICD-10-CM | POA: Diagnosis not present

## 2019-08-28 DIAGNOSIS — Z79899 Other long term (current) drug therapy: Secondary | ICD-10-CM | POA: Diagnosis not present

## 2019-08-28 DIAGNOSIS — I120 Hypertensive chronic kidney disease with stage 5 chronic kidney disease or end stage renal disease: Secondary | ICD-10-CM | POA: Diagnosis not present

## 2019-08-28 DIAGNOSIS — G4733 Obstructive sleep apnea (adult) (pediatric): Secondary | ICD-10-CM | POA: Diagnosis not present

## 2019-08-28 DIAGNOSIS — N186 End stage renal disease: Secondary | ICD-10-CM | POA: Diagnosis not present

## 2019-08-28 DIAGNOSIS — E785 Hyperlipidemia, unspecified: Secondary | ICD-10-CM | POA: Diagnosis not present

## 2019-08-28 DIAGNOSIS — E875 Hyperkalemia: Secondary | ICD-10-CM | POA: Diagnosis not present

## 2019-08-28 DIAGNOSIS — Z4822 Encounter for aftercare following kidney transplant: Secondary | ICD-10-CM | POA: Diagnosis not present

## 2019-08-28 DIAGNOSIS — Z792 Long term (current) use of antibiotics: Secondary | ICD-10-CM | POA: Diagnosis not present

## 2019-08-28 DIAGNOSIS — Z7952 Long term (current) use of systemic steroids: Secondary | ICD-10-CM | POA: Diagnosis not present

## 2019-08-28 DIAGNOSIS — M069 Rheumatoid arthritis, unspecified: Secondary | ICD-10-CM | POA: Diagnosis not present

## 2019-08-28 DIAGNOSIS — E872 Acidosis: Secondary | ICD-10-CM | POA: Diagnosis not present

## 2019-08-28 DIAGNOSIS — E1122 Type 2 diabetes mellitus with diabetic chronic kidney disease: Secondary | ICD-10-CM | POA: Diagnosis not present

## 2019-08-31 ENCOUNTER — Other Ambulatory Visit: Payer: Self-pay | Admitting: *Deleted

## 2019-08-31 DIAGNOSIS — D6489 Other specified anemias: Secondary | ICD-10-CM | POA: Diagnosis not present

## 2019-08-31 DIAGNOSIS — G4733 Obstructive sleep apnea (adult) (pediatric): Secondary | ICD-10-CM | POA: Diagnosis not present

## 2019-08-31 DIAGNOSIS — E119 Type 2 diabetes mellitus without complications: Secondary | ICD-10-CM | POA: Diagnosis not present

## 2019-08-31 DIAGNOSIS — Z940 Kidney transplant status: Secondary | ICD-10-CM | POA: Diagnosis not present

## 2019-08-31 DIAGNOSIS — Z4822 Encounter for aftercare following kidney transplant: Secondary | ICD-10-CM | POA: Diagnosis not present

## 2019-08-31 DIAGNOSIS — I10 Essential (primary) hypertension: Secondary | ICD-10-CM | POA: Diagnosis not present

## 2019-08-31 DIAGNOSIS — Z79899 Other long term (current) drug therapy: Secondary | ICD-10-CM | POA: Diagnosis not present

## 2019-08-31 DIAGNOSIS — D849 Immunodeficiency, unspecified: Secondary | ICD-10-CM | POA: Diagnosis not present

## 2019-08-31 DIAGNOSIS — Z9989 Dependence on other enabling machines and devices: Secondary | ICD-10-CM | POA: Diagnosis not present

## 2019-08-31 DIAGNOSIS — E878 Other disorders of electrolyte and fluid balance, not elsewhere classified: Secondary | ICD-10-CM | POA: Diagnosis not present

## 2019-08-31 NOTE — Patient Outreach (Signed)
[facility]) Care Management  08/31/2019  Rachael George 26-Nov-1955 [id number]  Unsuccessful outreach attempt made to patient. RN Health Coach left HIPAA compliant voicemail message along with her contact information.  Plan: RN Health Coach will call patient within the month of July.  Emelia Loron RN, BSN [place] [phone] [email or web address]

## 2019-09-04 ENCOUNTER — Other Ambulatory Visit: Payer: Self-pay | Admitting: Family Medicine

## 2019-09-04 DIAGNOSIS — J4520 Mild intermittent asthma, uncomplicated: Secondary | ICD-10-CM

## 2019-09-05 DIAGNOSIS — Z940 Kidney transplant status: Secondary | ICD-10-CM | POA: Diagnosis not present

## 2019-09-12 DIAGNOSIS — R079 Chest pain, unspecified: Secondary | ICD-10-CM | POA: Diagnosis not present

## 2019-09-21 DIAGNOSIS — D649 Anemia, unspecified: Secondary | ICD-10-CM | POA: Diagnosis not present

## 2019-09-21 DIAGNOSIS — G4733 Obstructive sleep apnea (adult) (pediatric): Secondary | ICD-10-CM | POA: Diagnosis not present

## 2019-09-21 DIAGNOSIS — Z5181 Encounter for therapeutic drug level monitoring: Secondary | ICD-10-CM | POA: Diagnosis not present

## 2019-09-21 DIAGNOSIS — Z4822 Encounter for aftercare following kidney transplant: Secondary | ICD-10-CM | POA: Diagnosis not present

## 2019-09-21 DIAGNOSIS — Z79899 Other long term (current) drug therapy: Secondary | ICD-10-CM | POA: Diagnosis not present

## 2019-09-21 DIAGNOSIS — M069 Rheumatoid arthritis, unspecified: Secondary | ICD-10-CM | POA: Diagnosis not present

## 2019-09-21 DIAGNOSIS — Z792 Long term (current) use of antibiotics: Secondary | ICD-10-CM | POA: Diagnosis not present

## 2019-09-21 DIAGNOSIS — E119 Type 2 diabetes mellitus without complications: Secondary | ICD-10-CM | POA: Diagnosis not present

## 2019-09-21 DIAGNOSIS — Z7952 Long term (current) use of systemic steroids: Secondary | ICD-10-CM | POA: Diagnosis not present

## 2019-09-21 DIAGNOSIS — E875 Hyperkalemia: Secondary | ICD-10-CM | POA: Diagnosis not present

## 2019-09-21 DIAGNOSIS — E785 Hyperlipidemia, unspecified: Secondary | ICD-10-CM | POA: Diagnosis not present

## 2019-09-21 DIAGNOSIS — E872 Acidosis: Secondary | ICD-10-CM | POA: Diagnosis not present

## 2019-09-21 DIAGNOSIS — I10 Essential (primary) hypertension: Secondary | ICD-10-CM | POA: Diagnosis not present

## 2019-09-21 DIAGNOSIS — Z9989 Dependence on other enabling machines and devices: Secondary | ICD-10-CM | POA: Diagnosis not present

## 2019-09-29 DIAGNOSIS — Z940 Kidney transplant status: Secondary | ICD-10-CM | POA: Diagnosis not present

## 2019-09-29 DIAGNOSIS — I10 Essential (primary) hypertension: Secondary | ICD-10-CM | POA: Diagnosis not present

## 2019-09-29 DIAGNOSIS — D849 Immunodeficiency, unspecified: Secondary | ICD-10-CM | POA: Diagnosis not present

## 2019-10-02 DIAGNOSIS — E875 Hyperkalemia: Secondary | ICD-10-CM | POA: Diagnosis not present

## 2019-10-02 DIAGNOSIS — D8989 Other specified disorders involving the immune mechanism, not elsewhere classified: Secondary | ICD-10-CM | POA: Diagnosis not present

## 2019-10-02 DIAGNOSIS — I10 Essential (primary) hypertension: Secondary | ICD-10-CM | POA: Diagnosis not present

## 2019-10-02 DIAGNOSIS — Z9989 Dependence on other enabling machines and devices: Secondary | ICD-10-CM | POA: Diagnosis not present

## 2019-10-02 DIAGNOSIS — G4733 Obstructive sleep apnea (adult) (pediatric): Secondary | ICD-10-CM | POA: Diagnosis not present

## 2019-10-02 DIAGNOSIS — Z940 Kidney transplant status: Secondary | ICD-10-CM | POA: Diagnosis not present

## 2019-10-02 DIAGNOSIS — E785 Hyperlipidemia, unspecified: Secondary | ICD-10-CM | POA: Diagnosis not present

## 2019-10-02 DIAGNOSIS — E119 Type 2 diabetes mellitus without complications: Secondary | ICD-10-CM | POA: Diagnosis not present

## 2019-10-02 DIAGNOSIS — D649 Anemia, unspecified: Secondary | ICD-10-CM | POA: Diagnosis not present

## 2019-10-02 DIAGNOSIS — M069 Rheumatoid arthritis, unspecified: Secondary | ICD-10-CM | POA: Diagnosis not present

## 2019-10-03 DIAGNOSIS — Z5181 Encounter for therapeutic drug level monitoring: Secondary | ICD-10-CM | POA: Diagnosis not present

## 2019-10-03 DIAGNOSIS — T8143XS Infection following a procedure, organ and space surgical site, sequela: Secondary | ICD-10-CM | POA: Diagnosis not present

## 2019-10-03 DIAGNOSIS — N186 End stage renal disease: Secondary | ICD-10-CM | POA: Diagnosis present

## 2019-10-03 DIAGNOSIS — D849 Immunodeficiency, unspecified: Secondary | ICD-10-CM | POA: Diagnosis not present

## 2019-10-03 DIAGNOSIS — D631 Anemia in chronic kidney disease: Secondary | ICD-10-CM | POA: Diagnosis present

## 2019-10-03 DIAGNOSIS — T8141XA Infection following a procedure, superficial incisional surgical site, initial encounter: Secondary | ICD-10-CM | POA: Diagnosis not present

## 2019-10-03 DIAGNOSIS — I10 Essential (primary) hypertension: Secondary | ICD-10-CM | POA: Diagnosis not present

## 2019-10-03 DIAGNOSIS — L089 Local infection of the skin and subcutaneous tissue, unspecified: Secondary | ICD-10-CM | POA: Insufficient documentation

## 2019-10-03 DIAGNOSIS — Z4822 Encounter for aftercare following kidney transplant: Secondary | ICD-10-CM | POA: Diagnosis not present

## 2019-10-03 DIAGNOSIS — Z79899 Other long term (current) drug therapy: Secondary | ICD-10-CM | POA: Diagnosis not present

## 2019-10-03 DIAGNOSIS — T8619 Other complication of kidney transplant: Secondary | ICD-10-CM | POA: Diagnosis present

## 2019-10-03 DIAGNOSIS — Z940 Kidney transplant status: Secondary | ICD-10-CM | POA: Diagnosis not present

## 2019-10-03 DIAGNOSIS — T8149XA Infection following a procedure, other surgical site, initial encounter: Secondary | ICD-10-CM | POA: Diagnosis not present

## 2019-10-03 DIAGNOSIS — T8142XA Infection following a procedure, deep incisional surgical site, initial encounter: Secondary | ICD-10-CM | POA: Diagnosis present

## 2019-10-04 ENCOUNTER — Other Ambulatory Visit: Payer: Self-pay | Admitting: *Deleted

## 2019-10-04 NOTE — Patient Outreach (Signed)
[facility]) Care Management  10/04/2019  Rachael George 07-Aug-1955 [id number]  Unsuccessful outreach attempt made to patient. RN Health Coach left HIPAA compliant voicemail message along with her contact information.  Plan: RN Health Coach will call patient within the month of August.  Emelia Loron RN, BSN [place] [phone] [email or web address]

## 2019-10-09 ENCOUNTER — Telehealth: Payer: Self-pay | Admitting: Family Medicine

## 2019-10-09 DIAGNOSIS — E1122 Type 2 diabetes mellitus with diabetic chronic kidney disease: Secondary | ICD-10-CM | POA: Diagnosis not present

## 2019-10-09 DIAGNOSIS — Z8619 Personal history of other infectious and parasitic diseases: Secondary | ICD-10-CM | POA: Diagnosis not present

## 2019-10-09 DIAGNOSIS — Z87442 Personal history of urinary calculi: Secondary | ICD-10-CM | POA: Diagnosis not present

## 2019-10-09 DIAGNOSIS — I120 Hypertensive chronic kidney disease with stage 5 chronic kidney disease or end stage renal disease: Secondary | ICD-10-CM | POA: Diagnosis not present

## 2019-10-09 DIAGNOSIS — N186 End stage renal disease: Secondary | ICD-10-CM | POA: Diagnosis not present

## 2019-10-09 DIAGNOSIS — K219 Gastro-esophageal reflux disease without esophagitis: Secondary | ICD-10-CM | POA: Diagnosis not present

## 2019-10-09 DIAGNOSIS — L0291 Cutaneous abscess, unspecified: Secondary | ICD-10-CM | POA: Diagnosis not present

## 2019-10-09 DIAGNOSIS — T8149XA Infection following a procedure, other surgical site, initial encounter: Secondary | ICD-10-CM | POA: Diagnosis not present

## 2019-10-09 DIAGNOSIS — Z940 Kidney transplant status: Secondary | ICD-10-CM | POA: Diagnosis not present

## 2019-10-09 DIAGNOSIS — E785 Hyperlipidemia, unspecified: Secondary | ICD-10-CM | POA: Diagnosis not present

## 2019-10-09 DIAGNOSIS — J45909 Unspecified asthma, uncomplicated: Secondary | ICD-10-CM | POA: Diagnosis not present

## 2019-10-09 DIAGNOSIS — B9561 Methicillin susceptible Staphylococcus aureus infection as the cause of diseases classified elsewhere: Secondary | ICD-10-CM | POA: Diagnosis not present

## 2019-10-09 NOTE — Telephone Encounter (Signed)
Copied from [place] [id number]. Topic: General - Other >> Oct 09, 2019  1:21 PM [place] A wrote: Sharyn Lull with home health called to get Verbal orders for wound vac 1w9.  Best contact number [phone] and she had secured voicemail .

## 2019-10-09 NOTE — Telephone Encounter (Signed)
Rachael George was called and given verbal orders for patient.

## 2019-10-12 DIAGNOSIS — D849 Immunodeficiency, unspecified: Secondary | ICD-10-CM | POA: Diagnosis not present

## 2019-10-12 DIAGNOSIS — I10 Essential (primary) hypertension: Secondary | ICD-10-CM | POA: Diagnosis not present

## 2019-10-12 DIAGNOSIS — L089 Local infection of the skin and subcutaneous tissue, unspecified: Secondary | ICD-10-CM | POA: Diagnosis not present

## 2019-10-12 DIAGNOSIS — E119 Type 2 diabetes mellitus without complications: Secondary | ICD-10-CM | POA: Diagnosis not present

## 2019-10-12 DIAGNOSIS — Z4822 Encounter for aftercare following kidney transplant: Secondary | ICD-10-CM | POA: Diagnosis not present

## 2019-10-12 DIAGNOSIS — T148XXA Other injury of unspecified body region, initial encounter: Secondary | ICD-10-CM | POA: Diagnosis not present

## 2019-10-12 DIAGNOSIS — Z940 Kidney transplant status: Secondary | ICD-10-CM | POA: Diagnosis not present

## 2019-10-16 DIAGNOSIS — E1122 Type 2 diabetes mellitus with diabetic chronic kidney disease: Secondary | ICD-10-CM | POA: Diagnosis not present

## 2019-10-16 DIAGNOSIS — I120 Hypertensive chronic kidney disease with stage 5 chronic kidney disease or end stage renal disease: Secondary | ICD-10-CM | POA: Diagnosis not present

## 2019-10-16 DIAGNOSIS — T8149XA Infection following a procedure, other surgical site, initial encounter: Secondary | ICD-10-CM | POA: Diagnosis not present

## 2019-10-16 DIAGNOSIS — L0291 Cutaneous abscess, unspecified: Secondary | ICD-10-CM | POA: Diagnosis not present

## 2019-10-16 DIAGNOSIS — N186 End stage renal disease: Secondary | ICD-10-CM | POA: Diagnosis not present

## 2019-10-16 DIAGNOSIS — B9561 Methicillin susceptible Staphylococcus aureus infection as the cause of diseases classified elsewhere: Secondary | ICD-10-CM | POA: Diagnosis not present

## 2019-10-19 DIAGNOSIS — E119 Type 2 diabetes mellitus without complications: Secondary | ICD-10-CM | POA: Diagnosis not present

## 2019-10-19 DIAGNOSIS — Z4822 Encounter for aftercare following kidney transplant: Secondary | ICD-10-CM | POA: Diagnosis not present

## 2019-10-19 DIAGNOSIS — G4733 Obstructive sleep apnea (adult) (pediatric): Secondary | ICD-10-CM | POA: Diagnosis not present

## 2019-10-19 DIAGNOSIS — I10 Essential (primary) hypertension: Secondary | ICD-10-CM | POA: Diagnosis not present

## 2019-10-19 DIAGNOSIS — D849 Immunodeficiency, unspecified: Secondary | ICD-10-CM | POA: Diagnosis not present

## 2019-10-19 DIAGNOSIS — Z886 Allergy status to analgesic agent status: Secondary | ICD-10-CM | POA: Diagnosis not present

## 2019-10-19 DIAGNOSIS — Z79899 Other long term (current) drug therapy: Secondary | ICD-10-CM | POA: Diagnosis not present

## 2019-10-19 DIAGNOSIS — Z9989 Dependence on other enabling machines and devices: Secondary | ICD-10-CM | POA: Diagnosis not present

## 2019-10-19 DIAGNOSIS — Z940 Kidney transplant status: Secondary | ICD-10-CM | POA: Diagnosis not present

## 2019-10-19 DIAGNOSIS — Z7952 Long term (current) use of systemic steroids: Secondary | ICD-10-CM | POA: Diagnosis not present

## 2019-10-19 DIAGNOSIS — E785 Hyperlipidemia, unspecified: Secondary | ICD-10-CM | POA: Diagnosis not present

## 2019-10-19 DIAGNOSIS — T148XXA Other injury of unspecified body region, initial encounter: Secondary | ICD-10-CM | POA: Diagnosis not present

## 2019-10-19 DIAGNOSIS — D649 Anemia, unspecified: Secondary | ICD-10-CM | POA: Diagnosis not present

## 2019-10-19 DIAGNOSIS — M069 Rheumatoid arthritis, unspecified: Secondary | ICD-10-CM | POA: Diagnosis not present

## 2019-10-19 DIAGNOSIS — Z880 Allergy status to penicillin: Secondary | ICD-10-CM | POA: Diagnosis not present

## 2019-10-19 DIAGNOSIS — L089 Local infection of the skin and subcutaneous tissue, unspecified: Secondary | ICD-10-CM | POA: Diagnosis not present

## 2019-10-19 DIAGNOSIS — Z888 Allergy status to other drugs, medicaments and biological substances status: Secondary | ICD-10-CM | POA: Diagnosis not present

## 2019-10-19 DIAGNOSIS — E875 Hyperkalemia: Secondary | ICD-10-CM | POA: Diagnosis not present

## 2019-10-23 DIAGNOSIS — N186 End stage renal disease: Secondary | ICD-10-CM | POA: Diagnosis not present

## 2019-10-23 DIAGNOSIS — B9561 Methicillin susceptible Staphylococcus aureus infection as the cause of diseases classified elsewhere: Secondary | ICD-10-CM | POA: Diagnosis not present

## 2019-10-23 DIAGNOSIS — E1122 Type 2 diabetes mellitus with diabetic chronic kidney disease: Secondary | ICD-10-CM | POA: Diagnosis not present

## 2019-10-23 DIAGNOSIS — I120 Hypertensive chronic kidney disease with stage 5 chronic kidney disease or end stage renal disease: Secondary | ICD-10-CM | POA: Diagnosis not present

## 2019-10-23 DIAGNOSIS — L0291 Cutaneous abscess, unspecified: Secondary | ICD-10-CM | POA: Diagnosis not present

## 2019-10-23 DIAGNOSIS — T8149XA Infection following a procedure, other surgical site, initial encounter: Secondary | ICD-10-CM | POA: Diagnosis not present

## 2019-10-26 DIAGNOSIS — Z7952 Long term (current) use of systemic steroids: Secondary | ICD-10-CM | POA: Diagnosis not present

## 2019-10-26 DIAGNOSIS — E785 Hyperlipidemia, unspecified: Secondary | ICD-10-CM | POA: Diagnosis not present

## 2019-10-26 DIAGNOSIS — B9561 Methicillin susceptible Staphylococcus aureus infection as the cause of diseases classified elsewhere: Secondary | ICD-10-CM | POA: Diagnosis not present

## 2019-10-26 DIAGNOSIS — D849 Immunodeficiency, unspecified: Secondary | ICD-10-CM | POA: Diagnosis not present

## 2019-10-26 DIAGNOSIS — D649 Anemia, unspecified: Secondary | ICD-10-CM | POA: Diagnosis not present

## 2019-10-26 DIAGNOSIS — T8149XA Infection following a procedure, other surgical site, initial encounter: Secondary | ICD-10-CM | POA: Diagnosis not present

## 2019-10-26 DIAGNOSIS — Z940 Kidney transplant status: Secondary | ICD-10-CM | POA: Diagnosis not present

## 2019-10-26 DIAGNOSIS — E119 Type 2 diabetes mellitus without complications: Secondary | ICD-10-CM | POA: Diagnosis not present

## 2019-10-26 DIAGNOSIS — M069 Rheumatoid arthritis, unspecified: Secondary | ICD-10-CM | POA: Diagnosis not present

## 2019-10-26 DIAGNOSIS — E875 Hyperkalemia: Secondary | ICD-10-CM | POA: Diagnosis not present

## 2019-10-26 DIAGNOSIS — I10 Essential (primary) hypertension: Secondary | ICD-10-CM | POA: Diagnosis not present

## 2019-10-26 DIAGNOSIS — Z792 Long term (current) use of antibiotics: Secondary | ICD-10-CM | POA: Diagnosis not present

## 2019-10-26 DIAGNOSIS — G4733 Obstructive sleep apnea (adult) (pediatric): Secondary | ICD-10-CM | POA: Diagnosis not present

## 2019-10-26 DIAGNOSIS — T8610 Unspecified complication of kidney transplant: Secondary | ICD-10-CM | POA: Diagnosis not present

## 2019-10-26 DIAGNOSIS — Z4822 Encounter for aftercare following kidney transplant: Secondary | ICD-10-CM | POA: Diagnosis not present

## 2019-10-26 DIAGNOSIS — Z79899 Other long term (current) drug therapy: Secondary | ICD-10-CM | POA: Diagnosis not present

## 2019-10-26 DIAGNOSIS — E1165 Type 2 diabetes mellitus with hyperglycemia: Secondary | ICD-10-CM | POA: Diagnosis not present

## 2019-10-26 DIAGNOSIS — Z5181 Encounter for therapeutic drug level monitoring: Secondary | ICD-10-CM | POA: Diagnosis not present

## 2019-10-26 DIAGNOSIS — Z1611 Resistance to penicillins: Secondary | ICD-10-CM | POA: Diagnosis not present

## 2019-10-30 DIAGNOSIS — I120 Hypertensive chronic kidney disease with stage 5 chronic kidney disease or end stage renal disease: Secondary | ICD-10-CM | POA: Diagnosis not present

## 2019-10-30 DIAGNOSIS — T8149XA Infection following a procedure, other surgical site, initial encounter: Secondary | ICD-10-CM | POA: Diagnosis not present

## 2019-10-30 DIAGNOSIS — L0291 Cutaneous abscess, unspecified: Secondary | ICD-10-CM | POA: Diagnosis not present

## 2019-10-30 DIAGNOSIS — N186 End stage renal disease: Secondary | ICD-10-CM | POA: Diagnosis not present

## 2019-10-30 DIAGNOSIS — E1122 Type 2 diabetes mellitus with diabetic chronic kidney disease: Secondary | ICD-10-CM | POA: Diagnosis not present

## 2019-10-30 DIAGNOSIS — B9561 Methicillin susceptible Staphylococcus aureus infection as the cause of diseases classified elsewhere: Secondary | ICD-10-CM | POA: Diagnosis not present

## 2019-10-31 ENCOUNTER — Other Ambulatory Visit: Payer: Self-pay | Admitting: *Deleted

## 2019-10-31 NOTE — Patient Outreach (Signed)
[facility]) Care Management  10/31/2019  Rachael George 02/13/1956 [id number]  Unsuccessful outreach attempt made to patient. RN Health Coach left HIPAA compliant voicemail message along with her contact information.  Plan: RN Health Coach will call patient within the month of September and will send an unsuccessful letter to patient.   Emelia Loron RN, BSN [place] [phone] [email or web address]

## 2019-11-06 DIAGNOSIS — B9561 Methicillin susceptible Staphylococcus aureus infection as the cause of diseases classified elsewhere: Secondary | ICD-10-CM | POA: Diagnosis not present

## 2019-11-06 DIAGNOSIS — T8149XA Infection following a procedure, other surgical site, initial encounter: Secondary | ICD-10-CM | POA: Diagnosis not present

## 2019-11-06 DIAGNOSIS — E1122 Type 2 diabetes mellitus with diabetic chronic kidney disease: Secondary | ICD-10-CM | POA: Diagnosis not present

## 2019-11-06 DIAGNOSIS — N186 End stage renal disease: Secondary | ICD-10-CM | POA: Diagnosis not present

## 2019-11-06 DIAGNOSIS — I120 Hypertensive chronic kidney disease with stage 5 chronic kidney disease or end stage renal disease: Secondary | ICD-10-CM | POA: Diagnosis not present

## 2019-11-06 DIAGNOSIS — L0291 Cutaneous abscess, unspecified: Secondary | ICD-10-CM | POA: Diagnosis not present

## 2019-11-07 DIAGNOSIS — Z4822 Encounter for aftercare following kidney transplant: Secondary | ICD-10-CM | POA: Diagnosis not present

## 2019-11-07 DIAGNOSIS — D849 Immunodeficiency, unspecified: Secondary | ICD-10-CM | POA: Diagnosis not present

## 2019-11-07 DIAGNOSIS — E119 Type 2 diabetes mellitus without complications: Secondary | ICD-10-CM | POA: Diagnosis not present

## 2019-11-07 DIAGNOSIS — T148XXA Other injury of unspecified body region, initial encounter: Secondary | ICD-10-CM | POA: Diagnosis not present

## 2019-11-07 DIAGNOSIS — Z940 Kidney transplant status: Secondary | ICD-10-CM | POA: Diagnosis not present

## 2019-11-07 DIAGNOSIS — T148XXD Other injury of unspecified body region, subsequent encounter: Secondary | ICD-10-CM | POA: Diagnosis not present

## 2019-11-07 DIAGNOSIS — L089 Local infection of the skin and subcutaneous tissue, unspecified: Secondary | ICD-10-CM | POA: Diagnosis not present

## 2019-11-07 DIAGNOSIS — I10 Essential (primary) hypertension: Secondary | ICD-10-CM | POA: Diagnosis not present

## 2019-11-14 DIAGNOSIS — Z4822 Encounter for aftercare following kidney transplant: Secondary | ICD-10-CM | POA: Diagnosis not present

## 2019-11-14 DIAGNOSIS — Z7982 Long term (current) use of aspirin: Secondary | ICD-10-CM | POA: Diagnosis not present

## 2019-11-14 DIAGNOSIS — D84821 Immunodeficiency due to drugs: Secondary | ICD-10-CM | POA: Diagnosis not present

## 2019-11-14 DIAGNOSIS — D649 Anemia, unspecified: Secondary | ICD-10-CM | POA: Diagnosis not present

## 2019-11-14 DIAGNOSIS — Z940 Kidney transplant status: Secondary | ICD-10-CM | POA: Diagnosis not present

## 2019-11-14 DIAGNOSIS — E875 Hyperkalemia: Secondary | ICD-10-CM | POA: Diagnosis not present

## 2019-11-14 DIAGNOSIS — I10 Essential (primary) hypertension: Secondary | ICD-10-CM | POA: Diagnosis not present

## 2019-11-14 DIAGNOSIS — E119 Type 2 diabetes mellitus without complications: Secondary | ICD-10-CM | POA: Diagnosis not present

## 2019-11-14 DIAGNOSIS — Z794 Long term (current) use of insulin: Secondary | ICD-10-CM | POA: Diagnosis not present

## 2019-11-14 DIAGNOSIS — M069 Rheumatoid arthritis, unspecified: Secondary | ICD-10-CM | POA: Diagnosis not present

## 2019-11-14 DIAGNOSIS — Z792 Long term (current) use of antibiotics: Secondary | ICD-10-CM | POA: Diagnosis not present

## 2019-11-14 DIAGNOSIS — Z79899 Other long term (current) drug therapy: Secondary | ICD-10-CM | POA: Diagnosis not present

## 2019-11-14 DIAGNOSIS — G4733 Obstructive sleep apnea (adult) (pediatric): Secondary | ICD-10-CM | POA: Diagnosis not present

## 2019-11-14 DIAGNOSIS — Z7952 Long term (current) use of systemic steroids: Secondary | ICD-10-CM | POA: Diagnosis not present

## 2019-11-14 DIAGNOSIS — Z9989 Dependence on other enabling machines and devices: Secondary | ICD-10-CM | POA: Diagnosis not present

## 2019-11-14 DIAGNOSIS — D849 Immunodeficiency, unspecified: Secondary | ICD-10-CM | POA: Diagnosis not present

## 2019-11-20 ENCOUNTER — Ambulatory Visit: Payer: Medicare Other | Admitting: Family Medicine

## 2019-11-24 DIAGNOSIS — Z23 Encounter for immunization: Secondary | ICD-10-CM | POA: Diagnosis not present

## 2019-11-26 ENCOUNTER — Other Ambulatory Visit: Payer: Self-pay | Admitting: Family Medicine

## 2019-11-26 DIAGNOSIS — I10 Essential (primary) hypertension: Secondary | ICD-10-CM

## 2019-11-26 DIAGNOSIS — J4520 Mild intermittent asthma, uncomplicated: Secondary | ICD-10-CM

## 2019-11-28 DIAGNOSIS — Z792 Long term (current) use of antibiotics: Secondary | ICD-10-CM | POA: Diagnosis not present

## 2019-11-28 DIAGNOSIS — T8610 Unspecified complication of kidney transplant: Secondary | ICD-10-CM | POA: Diagnosis not present

## 2019-11-28 DIAGNOSIS — D649 Anemia, unspecified: Secondary | ICD-10-CM | POA: Diagnosis not present

## 2019-11-28 DIAGNOSIS — G4733 Obstructive sleep apnea (adult) (pediatric): Secondary | ICD-10-CM | POA: Diagnosis not present

## 2019-11-28 DIAGNOSIS — Z940 Kidney transplant status: Secondary | ICD-10-CM | POA: Diagnosis not present

## 2019-11-28 DIAGNOSIS — Z79899 Other long term (current) drug therapy: Secondary | ICD-10-CM | POA: Diagnosis not present

## 2019-11-28 DIAGNOSIS — R197 Diarrhea, unspecified: Secondary | ICD-10-CM | POA: Diagnosis not present

## 2019-11-28 DIAGNOSIS — E875 Hyperkalemia: Secondary | ICD-10-CM | POA: Diagnosis not present

## 2019-11-28 DIAGNOSIS — D849 Immunodeficiency, unspecified: Secondary | ICD-10-CM | POA: Diagnosis not present

## 2019-11-28 DIAGNOSIS — Z4800 Encounter for change or removal of nonsurgical wound dressing: Secondary | ICD-10-CM | POA: Diagnosis not present

## 2019-11-28 DIAGNOSIS — E785 Hyperlipidemia, unspecified: Secondary | ICD-10-CM | POA: Diagnosis not present

## 2019-11-28 DIAGNOSIS — E119 Type 2 diabetes mellitus without complications: Secondary | ICD-10-CM | POA: Diagnosis not present

## 2019-11-28 DIAGNOSIS — Z7952 Long term (current) use of systemic steroids: Secondary | ICD-10-CM | POA: Diagnosis not present

## 2019-11-28 DIAGNOSIS — T8141XD Infection following a procedure, superficial incisional surgical site, subsequent encounter: Secondary | ICD-10-CM | POA: Diagnosis not present

## 2019-11-28 DIAGNOSIS — Z9889 Other specified postprocedural states: Secondary | ICD-10-CM | POA: Diagnosis not present

## 2019-11-28 DIAGNOSIS — I10 Essential (primary) hypertension: Secondary | ICD-10-CM | POA: Diagnosis not present

## 2019-11-28 DIAGNOSIS — Z4822 Encounter for aftercare following kidney transplant: Secondary | ICD-10-CM | POA: Diagnosis not present

## 2019-11-29 DIAGNOSIS — I10 Essential (primary) hypertension: Secondary | ICD-10-CM | POA: Diagnosis not present

## 2019-11-29 DIAGNOSIS — Z4822 Encounter for aftercare following kidney transplant: Secondary | ICD-10-CM | POA: Diagnosis not present

## 2019-11-29 DIAGNOSIS — E1122 Type 2 diabetes mellitus with diabetic chronic kidney disease: Secondary | ICD-10-CM | POA: Diagnosis not present

## 2019-12-09 ENCOUNTER — Other Ambulatory Visit: Payer: Self-pay | Admitting: Family Medicine

## 2019-12-09 DIAGNOSIS — I10 Essential (primary) hypertension: Secondary | ICD-10-CM

## 2019-12-12 DIAGNOSIS — E119 Type 2 diabetes mellitus without complications: Secondary | ICD-10-CM | POA: Diagnosis not present

## 2019-12-12 DIAGNOSIS — Z940 Kidney transplant status: Secondary | ICD-10-CM | POA: Diagnosis not present

## 2019-12-12 DIAGNOSIS — M069 Rheumatoid arthritis, unspecified: Secondary | ICD-10-CM | POA: Diagnosis not present

## 2019-12-12 DIAGNOSIS — Z4822 Encounter for aftercare following kidney transplant: Secondary | ICD-10-CM | POA: Diagnosis not present

## 2019-12-12 DIAGNOSIS — E875 Hyperkalemia: Secondary | ICD-10-CM | POA: Diagnosis not present

## 2019-12-12 DIAGNOSIS — D649 Anemia, unspecified: Secondary | ICD-10-CM | POA: Diagnosis not present

## 2019-12-12 DIAGNOSIS — E785 Hyperlipidemia, unspecified: Secondary | ICD-10-CM | POA: Diagnosis not present

## 2019-12-12 DIAGNOSIS — Z79899 Other long term (current) drug therapy: Secondary | ICD-10-CM | POA: Diagnosis not present

## 2019-12-12 DIAGNOSIS — K219 Gastro-esophageal reflux disease without esophagitis: Secondary | ICD-10-CM | POA: Diagnosis not present

## 2019-12-12 DIAGNOSIS — Z792 Long term (current) use of antibiotics: Secondary | ICD-10-CM | POA: Diagnosis not present

## 2019-12-12 DIAGNOSIS — G4733 Obstructive sleep apnea (adult) (pediatric): Secondary | ICD-10-CM | POA: Diagnosis not present

## 2019-12-12 DIAGNOSIS — Z7952 Long term (current) use of systemic steroids: Secondary | ICD-10-CM | POA: Diagnosis not present

## 2019-12-12 DIAGNOSIS — I10 Essential (primary) hypertension: Secondary | ICD-10-CM | POA: Diagnosis not present

## 2019-12-12 DIAGNOSIS — Z9989 Dependence on other enabling machines and devices: Secondary | ICD-10-CM | POA: Diagnosis not present

## 2019-12-12 DIAGNOSIS — Z7951 Long term (current) use of inhaled steroids: Secondary | ICD-10-CM | POA: Diagnosis not present

## 2019-12-18 ENCOUNTER — Other Ambulatory Visit: Payer: Self-pay | Admitting: *Deleted

## 2019-12-18 NOTE — Patient Outreach (Signed)
[facility]) Care Management  [place]  12/18/2019   Rachael George June 25, 1955 [id number]  Subjective: Successful telephone outreach call to patient. HIPAA identifiers obtained. Patient states she is doing well. 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 She explained it was a long journey of having surgery, maintaining a wound vac, dressing changes, to now it being completely healed. This process has been long and as a result the patient has not been able to be active as she was previously. Patient is now increasing her activity as tolerated to gain her strength, decrease her A1C, and improver her overall health and wellness.   Patient explained that her hypertension has decreased to the point of her kidney transplant team decreasing  her hypertension medication to ensure enough blood flow goes through the kidney to keep it healthy. What has changed is her diabetes has become an issue of not being controlled. Patient would like diabetes education and health coach assistance to improve her diabetes. Patient's A1c has increased to 6.6 on 10/26/19 and due to her kidney transplant she is motivated to improve her diabetes. Her blood sugar ranges are 120-232. Patient is now limiting sugary drinks and sugar. Nurse provided additional education about carbohydrates, increasing her physical activity and weight loss.   Encounter Medications:  Outpatient Encounter Medications as of 12/18/2019  Medication Sig Note  . Accu-Chek Softclix Lancets lancets Use as instructed to check blood sugar once daily. E11.22, N18.6, Z99.2   . acetaminophen (TYLENOL) 650 MG CR tablet Take 650-1,300 mg by mouth every 8 (eight) hours as needed for pain.   Marland Kitchen albuterol (PROVENTIL) (2.5 MG/3ML) 0.083% nebulizer solution Take 3 mLs (2.5 mg total) by nebulization every 6 (six) hours as needed for wheezing or shortness of breath. ICD 10:J45.20   . albuterol (VENTOLIN HFA) 108  (90 Base) MCG/ACT inhaler INHALE 2 PUFFS BY MOUTH EVERY 6 HOURS AS NEEDED FOR WHEEZE OR SHORTNESS OF BREATH   . allopurinol (ZYLOPRIM) 100 MG tablet Take 1 tablet (100 mg total) by mouth daily.   Marland Kitchen amLODipine (NORVASC) 10 MG tablet TAKE 1 TABLET BY MOUTH EVERYDAY AT BEDTIME 12/18/2019: Per pt. 5 mg  . atorvastatin (LIPITOR) 80 MG tablet Take 1 tablet (80 mg total) by mouth daily.   . Blood Glucose Monitoring Suppl (ACCU-CHEK GUIDE) w/Device KIT 1 each by Does not apply route daily. Use as instructed to check blood sugar once daily. E11.22, N18.6, Z99.2   . chlorpheniramine (CHLOR-TRIMETON) 4 MG tablet Take 1 tablet (4 mg total) by mouth 3 (three) times daily. (Patient taking differently: Take 8 mg by mouth daily at 12 noon. )   . clotrimazole (LOTRIMIN) 1 % cream Apply 1 application topically 2 (two) times daily. (Patient taking differently: Apply 1 application topically 2 (two) times daily as needed (skin irritation.). )   . cyclobenzaprine (FLEXERIL) 10 MG tablet Take 1 tablet (10 mg total) by mouth 2 (two) times daily as needed. For back pain   . famotidine (PEPCID) 20 MG tablet TAKE 1 TABLET BY MOUTH TWICE A DAY   . fluticasone (FLONASE) 50 MCG/ACT nasal spray SPRAY 2 SPRAYS INTO EACH NOSTRIL EVERY DAY (Patient taking differently: Place 2 sprays into both nostrils daily. )   . glucose blood (ACCU-CHEK GUIDE) test strip Use as instructed to check blood sugar once daily. E11.22, N18.6, Z99.2   . magnesium oxide (MAG-OX) 400 MG tablet Take 2 tablets by mouth 2 (two) times daily.   . meclizine (ANTIVERT)  25 MG tablet Take 25 mg by mouth 3 (three) times daily as needed for dizziness.   . multivitamin (RENA-VIT) TABS tablet Take 1 tablet by mouth daily with lunch.    . pantoprazole (PROTONIX) 40 MG tablet Take 40 mg by mouth 2 (two) times daily.    . phosphorus (K PHOS NEUTRAL) 155-852-130 MG tablet TAKE 2 TABLETS BY MOUTH IN THE MORNING AND AT BEDTIME.   . VELPHORO 500 MG chewable tablet Chew 500-1,000  mg by mouth See admin instructions. Take 2 tablets (1000 mg) by mouth with each meals & take 1 tablet (500 mg) by mouth with each snack   . budesonide-formoterol (SYMBICORT) 160-4.5 MCG/ACT inhaler Inhale 2 puffs into the lungs 2 (two) times daily for 1 day.   . carvedilol (COREG) 25 MG tablet Take 1 tablet (25 mg total) by mouth 2 (two) times daily with a meal. (Patient not taking: Reported on 12/18/2019) 12/18/2019: Per pt. 12.5 mg  . gabapentin (NEURONTIN) 300 MG capsule TAKE 1 CAPSULE BY MOUTH TWICE A DAY (Patient not taking: Reported on 12/18/2019) 12/18/2019: Not needed  . hydrALAZINE (APRESOLINE) 25 MG tablet Take 25 mg by mouth 3 (three) times daily.  (Patient not taking: Reported on 12/18/2019) 12/18/2019: Per pt. Not taking   No facility-administered encounter medications on file as of 12/18/2019.    Functional Status:  In your present state of health, do you have any difficulty performing the following activities: 04/27/2019  Hearing? N  Vision? Y  Comment reports blurry vision and floater, has ophthalmologist appnt 04/28/19  Difficulty concentrating or making decisions? N  Walking or climbing stairs? N  Dressing or bathing? N  Doing errands, shopping? N  Preparing Food and eating ? N  Using the Toilet? N  In the past six months, have you accidently leaked urine? N  Do you have problems with loss of bowel control? N  Managing your Medications? N  Managing your Finances? N  Housekeeping or managing your Housekeeping? N  Some recent data might be hidden    Fall/Depression Screening: Fall Risk  12/18/2019 06/29/2019 05/23/2019  Falls in the past year? 0 0 0  Number falls in past yr: 0 0 -  Injury with Fall? 0 0 -  Risk for fall due to : - Other (Comment) -  Risk for fall due to: Comment - gets dizzy at times with dialysis -  Follow up Falls prevention discussed;Education provided;Falls evaluation completed Falls prevention discussed;Education provided;Falls evaluation completed -   PHQ  2/9 Scores 12/18/2019 06/29/2019 04/27/2019 04/20/2019 04/20/2019 05/24/2018 11/23/2017  PHQ - 2 Score 0 0 0 0 0 0 0  PHQ- 9 Score - - - - - 0 0   Goals Addressed            This Visit's Progress   . Patient will maintain A1c below 7 within the next 90 days       [place] (see longitudinal plan of care for additional care plan information)  Objective:  Lab Results  Component Value Date   HGBA1C 5.3 05/23/2019 .   Lab Results  Component Value Date   CREATININE 7.00 (H) 04/18/2019   CREATININE 9.60 (H) 10/05/2018   CREATININE 6.60 (H) 05/12/2018 .   Marland Kitchen No results found for: EGFR  Current Barriers:  Marland Kitchen Knowledge Deficits related to basic Diabetes pathophysiology and self care/management  Case Manager Clinical Goal(s):  Over the next 90 days, patient will demonstrate improved adherence to prescribed treatment plan for diabetes  self care/management as evidenced by:  Marland Kitchen Verbalize daily monitoring and recording of CBG within 90 days . Verbalize adherence to ADA/ carb modified diet within the next 90 days . Verbalize exercise 2 days/week . Verbalize adherence to prescribed medication regimen within the next 90 days   Interventions:  . Provided education to patient about basic DM disease process . Provided patient with written educational materials related to hypo and hyperglycemia and importance of correct treatment . Advised patient, providing education and rationale, to check cbg daily and record, calling doctor for findings outside established parameters.   . Discussed counting carbohydrates, limiting sugary drinks, and increasing physical activity as tolerated  Patient Self Care Activities:  . Attends all scheduled provider appointments . Checks blood sugars as prescribed and utilize hyper and hypoglycemia protocol as needed . Patient is making changes to modify her diet to limit sugar and is motivated to receive education regarding diabetes to improve A1c . States she will  increase her activity as tolerated to gain strength and help to decrease A1c.  Initial goal documentation        Plan: RN Health Coach will send PCP today's assessment note, will send patient diabetes education, will call patient within the month of November, and patient agrees to future outreach calls.   Emelia Loron RN, BSN [place] [phone] [email or web address]

## 2019-12-19 ENCOUNTER — Encounter: Payer: Self-pay | Admitting: Family Medicine

## 2019-12-19 ENCOUNTER — Ambulatory Visit: Payer: Medicare Other | Attending: Family Medicine | Admitting: Family Medicine

## 2019-12-19 ENCOUNTER — Other Ambulatory Visit: Payer: Self-pay

## 2019-12-19 VITALS — BP 112/69 | HR 78 | Temp 98.3°F | Ht 62.0 in | Wt 170.4 lb

## 2019-12-19 DIAGNOSIS — Z79899 Other long term (current) drug therapy: Secondary | ICD-10-CM | POA: Insufficient documentation

## 2019-12-19 DIAGNOSIS — M069 Rheumatoid arthritis, unspecified: Secondary | ICD-10-CM | POA: Insufficient documentation

## 2019-12-19 DIAGNOSIS — N186 End stage renal disease: Secondary | ICD-10-CM | POA: Diagnosis not present

## 2019-12-19 DIAGNOSIS — I120 Hypertensive chronic kidney disease with stage 5 chronic kidney disease or end stage renal disease: Secondary | ICD-10-CM | POA: Diagnosis not present

## 2019-12-19 DIAGNOSIS — Z7952 Long term (current) use of systemic steroids: Secondary | ICD-10-CM | POA: Diagnosis not present

## 2019-12-19 DIAGNOSIS — E7800 Pure hypercholesterolemia, unspecified: Secondary | ICD-10-CM | POA: Diagnosis not present

## 2019-12-19 DIAGNOSIS — K219 Gastro-esophageal reflux disease without esophagitis: Secondary | ICD-10-CM | POA: Diagnosis not present

## 2019-12-19 DIAGNOSIS — Z6831 Body mass index (BMI) 31.0-31.9, adult: Secondary | ICD-10-CM | POA: Diagnosis not present

## 2019-12-19 DIAGNOSIS — I129 Hypertensive chronic kidney disease with stage 1 through stage 4 chronic kidney disease, or unspecified chronic kidney disease: Secondary | ICD-10-CM

## 2019-12-19 DIAGNOSIS — Z940 Kidney transplant status: Secondary | ICD-10-CM | POA: Diagnosis not present

## 2019-12-19 DIAGNOSIS — E785 Hyperlipidemia, unspecified: Secondary | ICD-10-CM | POA: Insufficient documentation

## 2019-12-19 DIAGNOSIS — Z992 Dependence on renal dialysis: Secondary | ICD-10-CM | POA: Diagnosis not present

## 2019-12-19 DIAGNOSIS — G4730 Sleep apnea, unspecified: Secondary | ICD-10-CM | POA: Insufficient documentation

## 2019-12-19 DIAGNOSIS — E1122 Type 2 diabetes mellitus with diabetic chronic kidney disease: Secondary | ICD-10-CM | POA: Diagnosis not present

## 2019-12-19 DIAGNOSIS — Z1231 Encounter for screening mammogram for malignant neoplasm of breast: Secondary | ICD-10-CM | POA: Diagnosis not present

## 2019-12-19 DIAGNOSIS — E039 Hypothyroidism, unspecified: Secondary | ICD-10-CM | POA: Insufficient documentation

## 2019-12-19 DIAGNOSIS — M109 Gout, unspecified: Secondary | ICD-10-CM | POA: Insufficient documentation

## 2019-12-19 DIAGNOSIS — Z7951 Long term (current) use of inhaled steroids: Secondary | ICD-10-CM | POA: Diagnosis not present

## 2019-12-19 DIAGNOSIS — E6609 Other obesity due to excess calories: Secondary | ICD-10-CM | POA: Insufficient documentation

## 2019-12-19 DIAGNOSIS — J4520 Mild intermittent asthma, uncomplicated: Secondary | ICD-10-CM

## 2019-12-19 LAB — POCT GLYCOSYLATED HEMOGLOBIN (HGB A1C): HbA1c, POC (controlled diabetic range): 6.3 % (ref 0.0–7.0)

## 2019-12-19 MED ORDER — BUDESONIDE-FORMOTEROL FUMARATE 160-4.5 MCG/ACT IN AERO: 2.0000 | INHALATION_SPRAY | Freq: Two times a day (BID) | RESPIRATORY_TRACT | 1 refills | Status: AC

## 2019-12-19 MED ORDER — ALBUTEROL SULFATE HFA 108 (90 BASE) MCG/ACT IN AERS: INHALATION_SPRAY | RESPIRATORY_TRACT | 1 refills | Status: DC

## 2019-12-19 MED ORDER — FLUTICASONE PROPIONATE 50 MCG/ACT NA SUSP
2.0000 | Freq: Every day | NASAL | 6 refills | Status: DC
Start: 2019-12-19 — End: 2020-07-06

## 2019-12-19 NOTE — Progress Notes (Signed)
Subjective:  Patient ID: Rachael George, female    DOB: [birth year]  Age: 64 y.o. MRN: [id number]  CC: Diabetes   HPI [name] is a 64 yr old female with a past medical historyof hypertension, type 2 diabetes mellutlits (diet controlled with A1c of 6.3), Gout, ESRD status post R kidney transplant in 07/2019 at [facility] currently on Sensipar, mycophenolate, Tacrolimus and Valganciclovir, Prednisone - notes from care everywhere reviewed. She underwent surgical wound abscess drainage in 09/2019 with treatment with Ancef due to culture positive for staph aureus.   Last seen in the transplant clinic for follow-up 1 week ago BP systolic goal should be [phone] per transplant clinic and her antihypertensive regimen has been adjusted by the clinic. She still has fatigue and is not back to her baseline but otherwise she is doing well.  She has had no gout flares and is currently not in any gout prophylactic medications.  Her diabetes mellitus remains diet controlled (of note A1c has trended up to 6.3 from 5.3 previously) and her asthma is doing well on her current inhalers. Of note she has gained 7 pounds in the last 6 months and she has not been very active. She has no additional concerns today. Past Medical History:  Diagnosis Date  . Anemia   . Asthma   . ESRD (end stage renal disease) ([place])    MWF  . GERD (gastroesophageal reflux disease)   . Gout   . 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 Heavy menstrual bleeding   . High cholesterol   . History of blood transfusion 02/2016- last one   1990's- several ones   . History of hiatal hernia   . History of kidney stones 1980's  . Hyperlipemia 12/07/2012  . Hypertension   . Hypothyroidism   . Multiple gastric ulcers   . Pneumonia 2012  . Pre-diabetes   . Prediabetes   . RA (rheumatoid arthritis) ([place])   . Sleep apnea    [place]- have CPAP, not able to use every night     Past Surgical  History:  Procedure Laterality Date  . A/V SHUNTOGRAM N/A 09/08/2016   Procedure: A/V Shuntogram - Left Arm AV Graft;  Surgeon: Serafina Mitchell, MD;  Location: [place] [facility];  Service: Cardiovascular;  Laterality: N/A;  . A/V SHUNTOGRAM N/A 09/02/2017   Procedure: A/V SHUNTOGRAM - Left Arm AVG;  Surgeon: Conrad [place], MD;  Location: [place] [facility];  Service: Cardiovascular;  Laterality: N/A;  . A/V SHUNTOGRAM N/A 12/21/2017   Procedure: A/V SHUNTOGRAM - left arm;  Surgeon: Serafina Mitchell, MD;  Location: [place] [facility];  Service: Cardiovascular;  Laterality: N/A;  . A/V SHUNTOGRAM Left 10/05/2018   Procedure: A/V SHUNTOGRAM;  Surgeon: Marty Heck, MD;  Location: [place] [facility];  Service: Cardiovascular;  Laterality: Left;  . ABDOMINAL HYSTERECTOMY  2000  . AV FISTULA PLACEMENT Left 05/25/2016   Procedure: INSERTION OF LEFT UPPER ARM ARTERIOVENOUS (AV) LOOP GORE-TEX GRAFT ARM;  Surgeon: Elam Dutch, MD;  Location: [place];  Service: Vascular;  Laterality: Left;  . BASCILIC VEIN TRANSPOSITION Right 12/06/2014   Procedure: FIRST STAGE BASILIC VEIN TRANSPOSITION - RIGHT;  Surgeon: Serafina Mitchell, MD;  Location: [place];  Service: Vascular;  Laterality: Right;  . BASCILIC VEIN TRANSPOSITION Right 02/07/2015   Procedure: RIGHT ARM SECOND STAGE BASILIC VEIN TRANSPOSITION;  Surgeon: Serafina Mitchell, MD;  [facility];  Service: Vascular;  Laterality: Right;  . BASCILIC VEIN TRANSPOSITION Left 04/22/2016   Procedure: FIRST STAGE BASILIC VEIN TRANSPOSITION LEFT ARM;  Surgeon: Conrad [place], MD;  Location: [place];  Service: Vascular;  Laterality: Left;  . COLONOSCOPY W/ POLYPECTOMY    . ECTOPIC PREGNANCY SURGERY  02/1982  . EXCHANGE OF A DIALYSIS CATHETER Right 04/22/2016   Procedure: EXCHANGE OF A DIALYSIS CATHETER - INSERTION RIGHT INTERNAL JUGULAR & REMOVAL FROM LEFT INTERNAL JUGULAR;  Surgeon: Conrad [place], MD;  Location: [place];  Service: Vascular;  Laterality: Right;  . I & D  EXTREMITY Right 02/07/2015   Procedure: IRRIGATION AND DEBRIDEMENT RIGHT ARM HEMATOMA;  Surgeon: Serafina Mitchell, MD;  Location: [place];  Service: Vascular;  Laterality: Right;  . INSERTION OF DIALYSIS CATHETER  03/03/2016   Procedure: INSERTION OF DIALYSIS CATHETER;  Surgeon: Elam Dutch, MD;  Location: [place];  Service: Vascular;;  . IR AV DIALY SHUNT INTRO NEEDLE/INTRACATH INITIAL W/PTA/IMG LEFT  04/20/2017  . IR AV DIALY SHUNT INTRO NEEDLE/INTRACATH INITIAL W/PTA/IMG LEFT  06/24/2017  . IR THROMBECTOMY AV FISTULA W/THROMBOLYSIS/PTA INC/SHUNT/IMG LEFT Left 01/04/2017  . IR US GUIDE VASC ACCESS LEFT  01/04/2017  . left shoulder surgery  08/2005  . LIGATION OF ARTERIOVENOUS  FISTULA  03/03/2016   Procedure: LIGATION OF ARTERIOVENOUS  FISTULA;  Surgeon: Elam Dutch, MD;  [facility];  Service: Vascular;;  . LIGATION OF ARTERIOVENOUS  FISTULA Right 09/06/2018   Procedure: Resection of pseudoaneursym and end to end repair of right brachial artery;  Surgeon: Elam Dutch, MD;  Location[facility] OR;  Service: Vascular;  Laterality: Right;  . PERIPHERAL VASCULAR BALLOON ANGIOPLASTY  09/08/2016   Procedure: Peripheral Vascular Balloon Angioplasty;  Surgeon: Serafina Mitchell, MD;  Location: [place] [facility];  Service: Cardiovascular;;  left avf  . PERIPHERAL VASCULAR BALLOON ANGIOPLASTY  09/02/2017   Procedure: PERIPHERAL VASCULAR BALLOON ANGIOPLASTY;  Surgeon: Conrad Kennard, MD;  Location: [place] [facility];  Service: Cardiovascular;;  left AV Graft  . PERIPHERAL VASCULAR BALLOON ANGIOPLASTY  12/21/2017   Procedure: PERIPHERAL VASCULAR BALLOON ANGIOPLASTY;  Surgeon: Serafina Mitchell, MD;  Location: [place] [facility];  Service: Cardiovascular;;  INOMINATE / UPPER ARM AV GRAFT  . PERIPHERAL VASCULAR BALLOON ANGIOPLASTY Left 05/12/2018   Procedure: PERIPHERAL VASCULAR BALLOON ANGIOPLASTY;  Surgeon: Marty Heck, MD;  Location: [place] [facility];  Service: Cardiovascular;  Laterality: Left;   Arm fistula  . PERIPHERAL VASCULAR BALLOON ANGIOPLASTY Left 10/05/2018   Procedure: PERIPHERAL VASCULAR BALLOON ANGIOPLASTY;  Surgeon: Marty Heck, MD;  Location: [place] [facility];  Service: Cardiovascular;  Laterality: Left;  central and peripheral vein  . PERIPHERAL VASCULAR BALLOON ANGIOPLASTY Left 04/18/2019   Procedure: PERIPHERAL VASCULAR BALLOON ANGIOPLASTY;  Surgeon: Serafina Mitchell, MD;  Location: [place] [facility];  Service: Cardiovascular;  Laterality: Left;  arm fistula   . TEE WITHOUT CARDIOVERSION N/A 03/06/2016   Procedure: TRANSESOPHAGEAL ECHOCARDIOGRAM (TEE);  Surgeon: Sueanne Margarita, MD;  Location: [facility]COPY;  Service: Cardiovascular;  Laterality: N/A;  . TUBAL LIGATION  11/1986    Family History  Problem Relation Age of Onset  . Hypertension Mother   . Breast cancer Maternal Aunt   . Deep vein thrombosis Daughter   . Hyperlipidemia Daughter   . Hypertension Daughter   . Prostate cancer Maternal Grandmother     Allergies  Allergen Reactions  . Atacand [Candesartan] Anaphylaxis and Other (See Comments)  Swelling of the mouth and tongue.  Marland Kitchen Lisinopril Anaphylaxis and Other (See Comments)    Swelling of the mouth and tongue.  . Nsaids Anaphylaxis and Other (See Comments)    Swelling of the mouth and tongue.  Marland Kitchen Penicillins Rash and Other (See Comments)    Has patient had a PCN reaction causing immediate rash, facial/tongue/throat swelling, SOB or lightheadedness with hypotension: No Has patient had a PCN reaction causing severe rash involving mucus membranes or skin necrosis: No Has patient had a PCN reaction that required hospitalization No Has patient had a PCN reaction occurring within the last 10 years: No If all of the above answers are "NO", then may proceed with Cephalosporin use.     Outpatient Medications Prior to Visit  Medication Sig Dispense Refill  . Accu-Chek Softclix Lancets lancets Use as instructed to check blood sugar once daily.  E11.22, N18.6, Z99.2 100 each 12  . acetaminophen (TYLENOL) 650 MG CR tablet Take 650-1,300 mg by mouth every 8 (eight) hours as needed for pain.    Marland Kitchen albuterol (PROVENTIL) (2.5 MG/3ML) 0.083% nebulizer solution Take 3 mLs (2.5 mg total) by nebulization every 6 (six) hours as needed for wheezing or shortness of breath. ICD 10:J45.20 75 mL 0  . amLODipine (NORVASC) 10 MG tablet TAKE 1 TABLET BY MOUTH EVERYDAY AT BEDTIME 30 tablet 0  . atorvastatin (LIPITOR) 80 MG tablet Take 1 tablet (80 mg total) by mouth daily. 30 tablet 6  . Blood Glucose Monitoring Suppl (ACCU-CHEK GUIDE) w/Device KIT 1 each by Does not apply route daily. Use as instructed to check blood sugar once daily. E11.22, N18.6, Z99.2 1 kit 0  . carvedilol (COREG) 25 MG tablet Take 1 tablet (25 mg total) by mouth 2 (two) times daily with a meal. 60 tablet 6  . chlorpheniramine (CHLOR-TRIMETON) 4 MG tablet Take 1 tablet (4 mg total) by mouth 3 (three) times daily. (Patient taking differently: Take 8 mg by mouth daily at 12 noon. ) 190 tablet 0  . cinacalcet (SENSIPAR) 30 MG tablet Take 1 tablet by mouth daily.    . clotrimazole (LOTRIMIN) 1 % cream Apply 1 application topically 2 (two) times daily. (Patient taking differently: Apply 1 application topically 2 (two) times daily as needed (skin irritation.). ) 30 g 1  . cyclobenzaprine (FLEXERIL) 10 MG tablet Take 1 tablet (10 mg total) by mouth 2 (two) times daily as needed. For back pain 60 tablet 2  . glucose blood (ACCU-CHEK GUIDE) test strip Use as instructed to check blood sugar once daily. E11.22, N18.6, Z99.2 30 each 12  . hydrochlorothiazide (MICROZIDE) 12.5 MG capsule TAKE 1 CAPSULE BY MOUTH EVERY MONDAY, WEDNESDAY, FRIDAY.    . magnesium oxide (MAG-OX) 400 MG tablet Take 2 tablets by mouth 2 (two) times daily.    . meclizine (ANTIVERT) 25 MG tablet Take 25 mg by mouth 3 (three) times daily as needed for dizziness.    . multivitamin (RENA-VIT) TABS tablet Take 1 tablet by mouth daily  with lunch.     . pantoprazole (PROTONIX) 40 MG tablet Take 40 mg by mouth 2 (two) times daily.     . phosphorus (K PHOS NEUTRAL) 155-852-130 MG tablet TAKE 2 TABLETS BY MOUTH IN THE MORNING AND AT BEDTIME.    . predniSONE (DELTASONE) 5 MG tablet Take 1 tablet by mouth daily.    Marland Kitchen senna-docusate (SENOKOT-S) 8.6-50 MG tablet Take 1 tablet by mouth 2 (two) times daily.    Marland Kitchen sulfamethoxazole-trimethoprim (BACTRIM) 400-80 MG  tablet TAKE 1 TABLET BY MOUTH EVERY MONDAY, WEDNESDAY, FRIDAY.    . Tacrolimus ER 1 MG TB24 Take by mouth.    . valGANciclovir (VALCYTE) 450 MG tablet Take by mouth.    Marland Kitchen albuterol (VENTOLIN HFA) 108 (90 Base) MCG/ACT inhaler INHALE 2 PUFFS BY MOUTH EVERY 6 HOURS AS NEEDED FOR WHEEZE OR SHORTNESS OF BREATH 54 each 0  . fluticasone (FLONASE) 50 MCG/ACT nasal spray SPRAY 2 SPRAYS INTO EACH NOSTRIL EVERY DAY (Patient taking differently: Place 2 sprays into both nostrils daily. ) 16 mL 2  . VELPHORO 500 MG chewable tablet Chew 500-1,000 mg by mouth See admin instructions. Take 2 tablets (1000 mg) by mouth with each meals & take 1 tablet (500 mg) by mouth with each snack (Patient not taking: Reported on 12/19/2019)    . allopurinol (ZYLOPRIM) 100 MG tablet Take 1 tablet (100 mg total) by mouth daily. (Patient not taking: Reported on 12/19/2019) 30 tablet 6  . budesonide-formoterol (SYMBICORT) 160-4.5 MCG/ACT inhaler Inhale 2 puffs into the lungs 2 (two) times daily for 1 day. 1 Inhaler 6  . famotidine (PEPCID) 20 MG tablet TAKE 1 TABLET BY MOUTH TWICE A DAY (Patient not taking: Reported on 12/19/2019) 180 tablet 1  . gabapentin (NEURONTIN) 300 MG capsule TAKE 1 CAPSULE BY MOUTH TWICE A DAY (Patient not taking: Reported on 12/18/2019) 180 capsule 1  . hydrALAZINE (APRESOLINE) 25 MG tablet Take 25 mg by mouth 3 (three) times daily.  (Patient not taking: Reported on 12/18/2019)     No facility-administered medications prior to visit.     ROS Review of Systems  Constitutional: Positive for  fatigue. Negative for activity change and appetite change.  HENT: Negative for congestion, sinus pressure and sore throat.   Eyes: Negative for visual disturbance.  Respiratory: Negative for cough, chest tightness, shortness of breath and wheezing.   Cardiovascular: Negative for chest pain and palpitations.  Gastrointestinal: Negative for abdominal distention, abdominal pain and constipation.  Endocrine: Negative for polydipsia.  Genitourinary: Negative for dysuria and frequency.  Musculoskeletal: Negative for arthralgias and back pain.  Skin: Negative for rash.  Neurological: Negative for tremors, light-headedness and numbness.  Hematological: Does not bruise/bleed easily.  Psychiatric/Behavioral: Negative for agitation and behavioral problems.    Objective:  BP 112/69   Pulse 78   Temp 98.3 F (36.8 C) (Oral)   Ht 5' 2"  (1.575 m)   Wt 170 lb 6.4 oz (77.3 kg)   SpO2 99%   BMI 31.17 kg/m   BP/Weight 12/19/2019 05/23/2019 6/76/7209  Systolic BP 470 962 836  Diastolic BP 69 83 91  Wt. (Lbs) 170.4 163 160  BMI 31.17 29.81 29.26      Physical Exam Constitutional:      Appearance: She is well-developed. She is obese.  Neck:     Vascular: No JVD.  Cardiovascular:     Rate and Rhythm: Normal rate.     Heart sounds: Normal heart sounds. No murmur heard.   Pulmonary:     Effort: Pulmonary effort is normal.     Breath sounds: Normal breath sounds. No wheezing or rales.  Chest:     Chest wall: No tenderness.  Abdominal:     General: Bowel sounds are normal. There is no distension.     Palpations: Abdomen is soft. There is no mass.     Tenderness: There is no abdominal tenderness.     Comments: Healed surgical scar  Musculoskeletal:        General: Normal  range of motion.     Right lower leg: No edema.     Left lower leg: No edema.  Neurological:     Mental Status: She is alert and oriented to person, place, and time.  Psychiatric:        Mood and Affect: Mood normal.      CMP Latest Ref Rng & Units 04/18/2019 10/05/2018 09/06/2018  Glucose 70 - 99 mg/dL 87 88 88  BUN 8 - 23 mg/dL 29(H) 56(H) -  Creatinine 0.44 - 1.00 mg/dL 7.00(H) 9.60(H) -  Sodium 135 - 145 mmol/L 138 137 138  Potassium 3.5 - 5.1 mmol/L 4.3 4.7 4.8  Chloride 98 - 111 mmol/L 98 100 -  CO2 22 - 32 mmol/L - - -  Calcium 8.9 - 10.3 mg/dL - - -  Total Protein 6.5 - 8.1 g/dL - - -  Total Bilirubin 0.3 - 1.2 mg/dL - - -  Alkaline Phos 38 - 126 U/L - - -  AST 15 - 41 U/L - - -  ALT 0 - 44 U/L - - -    Lipid Panel     Component Value Date/Time   CHOL 203 (H) 05/26/2018 0755   TRIG 200 (H) 05/26/2018 0755   HDL 40 05/26/2018 0755   CHOLHDL 5.1 (H) 05/26/2018 0755   CHOLHDL 4.6 09/02/2015 0958   VLDL 60 (H) 09/02/2015 0958   LDLCALC 123 (H) 05/26/2018 0755   LDLDIRECT 130.7 04/05/2013 0850    CBC    Component Value Date/Time   WBC 7.9 02/11/2018 1238   RBC 3.93 02/11/2018 1238   HGB 11.9 (L) 04/18/2019 0928   HCT 35.0 (L) 04/18/2019 0928   PLT 178 02/11/2018 1238   MCV 77.6 (L) 02/11/2018 1238   MCH 24.7 (L) 02/11/2018 1238   MCHC 31.8 02/11/2018 1238   RDW 17.2 (H) 02/11/2018 1238   LYMPHSABS 1.7 02/11/2018 1238   MONOABS 0.5 02/11/2018 1238   EOSABS 0.1 02/11/2018 1238   BASOSABS 0.0 02/11/2018 1238    Lab Results  Component Value Date   HGBA1C 6.3 12/19/2019    Assessment & Plan:  1. Type 2 diabetes mellitus with chronic kidney disease on chronic dialysis, without long-term current use of insulin (HCC) A1c of 6.3 which has trended up from 5.3 previously; goal is less than 7.0 Chronic prednisone use can be contributory We have discussed extensively dietary modifications as well as other lifestyle changes She is scheduled to see a nutritionist Counseled on Diabetic diet, my plate method, 371 minutes of moderate intensity exercise/week Blood sugar logs with fasting goals of 80-120 mg/dl, random of less than 180 and in the event of sugars less than 60 mg/dl or  greater than 400 mg/dl encouraged to notify the clinic. Advised on the need for annual eye exams, annual foot exams, Pneumonia vaccine. - POCT glycosylated hemoglobin (Hb A1C) - CMP14+EGFR - LP+Non-HDL Cholesterol  2. Encounter for screening mammogram for malignant neoplasm of breast - MM DIGITAL SCREENING BILATERAL; Future  3. Mild intermittent asthma, unspecified whether complicated Stable - budesonide-formoterol (SYMBICORT) 160-4.5 MCG/ACT inhaler; Inhale 2 puffs into the lungs 2 (two) times daily for 1 day.  Dispense: 3 each; Refill: 1 - albuterol (VENTOLIN HFA) 108 (90 Base) MCG/ACT inhaler; INHALE 2 PUFFS BY MOUTH EVERY 6 HOURS AS NEEDED FOR WHEEZE OR SHORTNESS OF BREATH  Dispense: 54 each; Refill: 1  4. Hypertension associated with chronic kidney disease due to type 2 diabetes mellitus (HCC) Controlled Goal systolic blood pressure of  120-130 per transplant team Counseled on blood pressure goal of less than 130/80, low-sodium, DASH diet, medication compliance, 150 minutes of moderate intensity exercise per week. Discussed medication compliance, adverse effects. - hydrochlorothiazide (MICROZIDE) 12.5 MG capsule; TAKE 1 CAPSULE BY MOUTH EVERY MONDAY, WEDNESDAY, FRIDAY.  5. Kidney transplant recipient Management per transplant team - predniSONE (DELTASONE) 5 MG tablet; Take 1 tablet by mouth daily. - Tacrolimus ER 1 MG TB24; Take by mouth. - valGANciclovir (VALCYTE) 450 MG tablet; Take by mouth. - CBC with Differential/Platelet  6. Class 1 obesity due to excess calories with serious comorbidity and body mass index (BMI) of 31.0 to 31.9 in adult Discussed dietary modifications to reduce caloric intake Increase physical activity    Meds ordered this encounter  Medications  . budesonide-formoterol (SYMBICORT) 160-4.5 MCG/ACT inhaler    Sig: Inhale 2 puffs into the lungs 2 (two) times daily for 1 day.    Dispense:  3 each    Refill:  1    Order Specific Question:    Manufacturer?    Answer:   [employer] [[id number]]    Order Specific Question:   Quantity    Answer:   2  . albuterol (VENTOLIN HFA) 108 (90 Base) MCG/ACT inhaler    Sig: INHALE 2 PUFFS BY MOUTH EVERY 6 HOURS AS NEEDED FOR WHEEZE OR SHORTNESS OF BREATH    Dispense:  54 each    Refill:  1    [place] refill, must attend upcoming appt.  . fluticasone (FLONASE) 50 MCG/ACT nasal spray    Sig: Place 2 sprays into both nostrils daily.    Dispense:  16 g    Refill:  6    Follow-up: Return in about 6 months (around 06/17/2020) for Chronic disease management.       Charlott Rakes, MD, FAAFP. [facility] [place], [place]   12/19/2019, 1:12 PM

## 2019-12-19 NOTE — Patient Instructions (Signed)

## 2019-12-20 LAB — CBC WITH DIFFERENTIAL/PLATELET
Basophils Absolute: 0 10*3/uL (ref 0.0–0.2)
Basos: 1 %
EOS (ABSOLUTE): 0.1 10*3/uL (ref 0.0–0.4)
Eos: 1 %
Hematocrit: 35.4 % (ref 34.0–46.6)
Hemoglobin: 11.4 g/dL (ref 11.1–15.9)
Immature Grans (Abs): 0 10*3/uL (ref 0.0–0.1)
Immature Granulocytes: 1 %
Lymphocytes Absolute: 0.7 10*3/uL (ref 0.7–3.1)
Lymphs: 16 %
MCH: 26 pg — ABNORMAL LOW (ref 26.6–33.0)
MCHC: 32.2 g/dL (ref 31.5–35.7)
MCV: 81 fL (ref 79–97)
Monocytes Absolute: 0.1 10*3/uL (ref 0.1–0.9)
Monocytes: 3 %
Neutrophils Absolute: 3.4 10*3/uL (ref 1.4–7.0)
Neutrophils: 78 %
Platelets: 145 10*3/uL — ABNORMAL LOW (ref 150–450)
RBC: 4.39 x10E6/uL (ref 3.77–5.28)
RDW: 13.8 % (ref 11.7–15.4)
WBC: 4.4 10*3/uL (ref 3.4–10.8)

## 2019-12-20 LAB — CMP14+EGFR
ALT: 29 IU/L (ref 0–32)
AST: 22 IU/L (ref 0–40)
Albumin/Globulin Ratio: 1.3 (ref 1.2–2.2)
Albumin: 4.1 g/dL (ref 3.8–4.8)
Alkaline Phosphatase: 269 IU/L — ABNORMAL HIGH (ref 44–121)
BUN/Creatinine Ratio: 21 (ref 12–28)
BUN: 26 mg/dL (ref 8–27)
Bilirubin Total: 0.2 mg/dL (ref 0.0–1.2)
CO2: 21 mmol/L (ref 20–29)
Calcium: 10.2 mg/dL (ref 8.7–10.3)
Chloride: 106 mmol/L (ref 96–106)
Creatinine, Ser: 1.25 mg/dL — ABNORMAL HIGH (ref 0.57–1.00)
GFR calc Af Amer: 53 mL/min/{1.73_m2} — ABNORMAL LOW (ref >59)
GFR calc non Af Amer: 46 mL/min/{1.73_m2} — ABNORMAL LOW (ref >59)
Globulin, Total: 3.1 g/dL (ref 1.5–4.5)
Glucose: 127 mg/dL — ABNORMAL HIGH (ref 65–99)
Potassium: 4.4 mmol/L (ref 3.5–5.2)
Sodium: 139 mmol/L (ref 134–144)
Total Protein: 7.2 g/dL (ref 6.0–8.5)

## 2019-12-20 LAB — LP+NON-HDL CHOLESTEROL
Cholesterol, Total: 146 mg/dL (ref 100–199)
HDL: 42 mg/dL (ref >39)
LDL Chol Calc (NIH): 79 mg/dL (ref 0–99)
Total Non-HDL-Chol (LDL+VLDL): 104 mg/dL (ref 0–129)
Triglycerides: 140 mg/dL (ref 0–149)
VLDL Cholesterol Cal: 25 mg/dL (ref 5–40)

## 2019-12-21 ENCOUNTER — Telehealth: Payer: Self-pay

## 2019-12-21 NOTE — Telephone Encounter (Signed)
Patient mg of t is wanting to know if she should still be taking 80mg  of Lipitor since her levels are normal.

## 2019-12-21 NOTE — Telephone Encounter (Signed)
Patient was called and informed of lab results. 

## 2019-12-21 NOTE — Telephone Encounter (Signed)
-----   Message from Charlott Rakes, MD sent at 12/21/2019 12:26 PM EDT ----- Cholesterol is normal, kidney function has improved with creatinine at 1.25.  Other labs are stable.

## 2019-12-22 NOTE — Telephone Encounter (Signed)
Yes. The current dose is keeping her levels normal.

## 2019-12-22 NOTE — Telephone Encounter (Signed)
Patient was called and informed to continue taking the medications.

## 2019-12-26 DIAGNOSIS — Z940 Kidney transplant status: Secondary | ICD-10-CM | POA: Diagnosis not present

## 2019-12-26 DIAGNOSIS — Z4822 Encounter for aftercare following kidney transplant: Secondary | ICD-10-CM | POA: Diagnosis not present

## 2020-01-02 ENCOUNTER — Other Ambulatory Visit: Payer: Self-pay | Admitting: Family Medicine

## 2020-01-02 DIAGNOSIS — N186 End stage renal disease: Secondary | ICD-10-CM

## 2020-01-02 DIAGNOSIS — E1122 Type 2 diabetes mellitus with diabetic chronic kidney disease: Secondary | ICD-10-CM

## 2020-01-02 MED ORDER — ACCU-CHEK SOFTCLIX LANCETS MISC: 8 refills | Status: DC

## 2020-01-02 MED ORDER — ACCU-CHEK GUIDE VI STRP: ORAL_STRIP | 8 refills | Status: DC

## 2020-01-02 NOTE — Telephone Encounter (Signed)
Medication Refill - Medication: Accu-Chek Softclix Lancets lancets glucose blood (ACCU-CHEK GUIDE) test strip     Preferred Pharmacy (with phone number or street name):  [facility] [phone] - [place], [place] [facility] [address] Phone:  [phone]  Fax:  [phone]       Agent: Please be advised that RX refills may take up to 3 business days. We ask that you follow-up with your pharmacy.

## 2020-01-02 NOTE — Telephone Encounter (Signed)
Change of pharmacy Requested Prescriptions  Pending Prescriptions Disp Refills  . Accu-Chek Softclix Lancets lancets 100 each 8    Sig: Use as instructed to check blood sugar once daily. E11.22, N18.6, Z99.2     Endocrinology: Diabetes - Testing Supplies Passed - 01/02/2020 10:23 AM      Passed - Valid encounter within last 12 months    Recent Outpatient Visits          2 weeks ago Type 2 diabetes mellitus with chronic kidney disease on chronic dialysis, without long-term current use of insulin ([place])   [place], [place], MD   7 months ago Type 2 diabetes mellitus with chronic kidney disease on chronic dialysis, without long-term current use of insulin ([place])   [facility] [place], Charlane Ferretti, MD   1 year ago End stage renal disease [facility])   [place], Charlane Ferretti, MD   1 year ago Type 2 diabetes mellitus with other specified complication, without long-term current use of insulin ([place])   [place], Charlane Ferretti, MD   2 years ago Type 2 diabetes mellitus with other specified complication, without long-term current use of insulin ([place])   [place], [place], MD      Future Appointments            In 5 months Charlott Rakes, MD [place]           . glucose blood (ACCU-CHEK GUIDE) test strip 30 each 8    Sig: Use as instructed to check blood sugar once daily. E11.22, N18.6, Z99.2     Endocrinology: Diabetes - Testing Supplies Passed - 01/02/2020 10:23 AM      Passed - Valid encounter within last 12 months    Recent Outpatient Visits          2 weeks ago Type 2 diabetes mellitus with chronic kidney disease on chronic dialysis, without long-term current use of insulin ([place])   [place], [place], MD   7 months ago Type 2 diabetes mellitus with  chronic kidney disease on chronic dialysis, without long-term current use of insulin ([place])   [facility] [place] [facility] [place], Charlane Ferretti, MD   1 year ago End stage renal disease [facility])   [place], Charlane Ferretti, MD   1 year ago Type 2 diabetes mellitus with other specified complication, without long-term current use of insulin ([place])   [place], Charlane Ferretti, MD   2 years ago Type 2 diabetes mellitus with other specified complication, without long-term current use of insulin ([place])   [place], MD      Future Appointments            In 5 months Charlott Rakes, MD [place]

## 2020-01-11 DIAGNOSIS — Z79899 Other long term (current) drug therapy: Secondary | ICD-10-CM | POA: Diagnosis not present

## 2020-01-11 DIAGNOSIS — N186 End stage renal disease: Secondary | ICD-10-CM | POA: Diagnosis not present

## 2020-01-11 DIAGNOSIS — Z7952 Long term (current) use of systemic steroids: Secondary | ICD-10-CM | POA: Diagnosis not present

## 2020-01-11 DIAGNOSIS — Z940 Kidney transplant status: Secondary | ICD-10-CM | POA: Diagnosis not present

## 2020-01-11 DIAGNOSIS — E1122 Type 2 diabetes mellitus with diabetic chronic kidney disease: Secondary | ICD-10-CM | POA: Diagnosis not present

## 2020-01-11 DIAGNOSIS — Z9989 Dependence on other enabling machines and devices: Secondary | ICD-10-CM | POA: Diagnosis not present

## 2020-01-11 DIAGNOSIS — K591 Functional diarrhea: Secondary | ICD-10-CM | POA: Diagnosis not present

## 2020-01-11 DIAGNOSIS — E875 Hyperkalemia: Secondary | ICD-10-CM | POA: Diagnosis not present

## 2020-01-11 DIAGNOSIS — E785 Hyperlipidemia, unspecified: Secondary | ICD-10-CM | POA: Diagnosis not present

## 2020-01-11 DIAGNOSIS — I120 Hypertensive chronic kidney disease with stage 5 chronic kidney disease or end stage renal disease: Secondary | ICD-10-CM | POA: Diagnosis not present

## 2020-01-11 DIAGNOSIS — D649 Anemia, unspecified: Secondary | ICD-10-CM | POA: Diagnosis not present

## 2020-01-11 DIAGNOSIS — K2101 Gastro-esophageal reflux disease with esophagitis, with bleeding: Secondary | ICD-10-CM | POA: Diagnosis not present

## 2020-01-11 DIAGNOSIS — M069 Rheumatoid arthritis, unspecified: Secondary | ICD-10-CM | POA: Diagnosis not present

## 2020-01-11 DIAGNOSIS — Z4822 Encounter for aftercare following kidney transplant: Secondary | ICD-10-CM | POA: Diagnosis not present

## 2020-01-11 DIAGNOSIS — Z792 Long term (current) use of antibiotics: Secondary | ICD-10-CM | POA: Diagnosis not present

## 2020-01-11 DIAGNOSIS — G4733 Obstructive sleep apnea (adult) (pediatric): Secondary | ICD-10-CM | POA: Diagnosis not present

## 2020-01-11 DIAGNOSIS — I10 Essential (primary) hypertension: Secondary | ICD-10-CM | POA: Diagnosis not present

## 2020-01-25 ENCOUNTER — Other Ambulatory Visit: Payer: Self-pay | Admitting: *Deleted

## 2020-01-25 NOTE — Patient Outreach (Signed)
[facility]) Care Management  01/25/2020  NEVIAH BRAUD Jul 16, 1955 [id number]  Unsuccessful outreach attempt made to patient. RN Health Coach left HIPAA compliant voicemail message along with her contact information.  Plan: RN Health Coach will call patient within the month of December.  Emelia Loron RN, BSN [place] [phone] [email or web address]

## 2020-01-26 ENCOUNTER — Ambulatory Visit: Payer: Self-pay | Admitting: *Deleted

## 2020-02-02 DIAGNOSIS — Z4822 Encounter for aftercare following kidney transplant: Secondary | ICD-10-CM | POA: Diagnosis not present

## 2020-02-02 DIAGNOSIS — D84821 Immunodeficiency due to drugs: Secondary | ICD-10-CM | POA: Diagnosis not present

## 2020-02-02 DIAGNOSIS — I120 Hypertensive chronic kidney disease with stage 5 chronic kidney disease or end stage renal disease: Secondary | ICD-10-CM | POA: Diagnosis not present

## 2020-02-02 DIAGNOSIS — E1122 Type 2 diabetes mellitus with diabetic chronic kidney disease: Secondary | ICD-10-CM | POA: Diagnosis not present

## 2020-02-02 DIAGNOSIS — N186 End stage renal disease: Secondary | ICD-10-CM | POA: Diagnosis not present

## 2020-02-08 DIAGNOSIS — E119 Type 2 diabetes mellitus without complications: Secondary | ICD-10-CM | POA: Diagnosis not present

## 2020-02-08 DIAGNOSIS — E785 Hyperlipidemia, unspecified: Secondary | ICD-10-CM | POA: Diagnosis not present

## 2020-02-08 DIAGNOSIS — Z79899 Other long term (current) drug therapy: Secondary | ICD-10-CM | POA: Diagnosis not present

## 2020-02-08 DIAGNOSIS — D649 Anemia, unspecified: Secondary | ICD-10-CM | POA: Diagnosis not present

## 2020-02-08 DIAGNOSIS — Z7952 Long term (current) use of systemic steroids: Secondary | ICD-10-CM | POA: Diagnosis not present

## 2020-02-08 DIAGNOSIS — Z4822 Encounter for aftercare following kidney transplant: Secondary | ICD-10-CM | POA: Diagnosis not present

## 2020-02-08 DIAGNOSIS — I10 Essential (primary) hypertension: Secondary | ICD-10-CM | POA: Diagnosis not present

## 2020-02-08 DIAGNOSIS — G4733 Obstructive sleep apnea (adult) (pediatric): Secondary | ICD-10-CM | POA: Diagnosis not present

## 2020-02-08 DIAGNOSIS — R2232 Localized swelling, mass and lump, left upper limb: Secondary | ICD-10-CM | POA: Diagnosis not present

## 2020-02-08 DIAGNOSIS — D72819 Decreased white blood cell count, unspecified: Secondary | ICD-10-CM | POA: Diagnosis not present

## 2020-02-08 DIAGNOSIS — Z7984 Long term (current) use of oral hypoglycemic drugs: Secondary | ICD-10-CM | POA: Diagnosis not present

## 2020-02-20 DIAGNOSIS — I770 Arteriovenous fistula, acquired: Secondary | ICD-10-CM | POA: Diagnosis not present

## 2020-02-20 DIAGNOSIS — Z940 Kidney transplant status: Secondary | ICD-10-CM | POA: Diagnosis not present

## 2020-02-20 DIAGNOSIS — R600 Localized edema: Secondary | ICD-10-CM | POA: Diagnosis not present

## 2020-03-04 ENCOUNTER — Other Ambulatory Visit: Payer: Self-pay | Admitting: *Deleted

## 2020-03-04 NOTE — Patient Outreach (Signed)
[name]) Care Management  [name]  03/04/2020   Rachael George 07-Dec-1955 [id number]  Subjective: Successful telephone outreach call to patient. HIPAA identifiers obtained. Patient reports she is doing well. Patient states she went to see a RD and feels she is doing better with following an ADA diet. She is reading food labels, is eating more fruits and vegetables, and drinks 3-4 bottles of water per day. Patient states that she has joined [employer] and plans to start going after the holidays. She is motivated to lose weight to improve her overall health. Patient reports that she is getting much better blood sugar reading than previously. Her FBS range is 90-110 and her postprandial ranges are 130-196. Patient did not have any further questions or concerns today and did confirm that she has this nurse's contact number to call her if needed.   Encounter Medications:  Outpatient Encounter Medications as of 03/04/2020  Medication Sig Note  . Accu-Chek Softclix Lancets lancets Use as instructed to check blood sugar once daily. E11.22, N18.6, Z99.2   . acetaminophen (TYLENOL) 650 MG CR tablet Take 650-1,300 mg by mouth every 8 (eight) hours as needed for pain.   Marland Kitchen albuterol (PROVENTIL) (2.5 MG/3ML) 0.083% nebulizer solution Take 3 mLs (2.5 mg total) by nebulization every 6 (six) hours as needed for wheezing or shortness of breath. ICD 10:J45.20   . albuterol (VENTOLIN HFA) 108 (90 Base) MCG/ACT inhaler INHALE 2 PUFFS BY MOUTH EVERY 6 HOURS AS NEEDED FOR WHEEZE OR SHORTNESS OF BREATH   . amLODipine (NORVASC) 10 MG tablet TAKE 1 TABLET BY MOUTH EVERYDAY AT BEDTIME 12/18/2019: Per pt. 5 mg  . atorvastatin (LIPITOR) 80 MG tablet Take 1 tablet (80 mg total) by mouth daily.   . Blood Glucose Monitoring Suppl (ACCU-CHEK GUIDE) w/Device KIT 1 each by Does not apply route daily. Use as instructed to check blood sugar once daily. E11.22, N18.6, Z99.2   . carvedilol (COREG)  25 MG tablet Take 1 tablet (25 mg total) by mouth 2 (two) times daily with a meal. 12/18/2019: Per pt. 12.5 mg  . chlorpheniramine (CHLOR-TRIMETON) 4 MG tablet Take 1 tablet (4 mg total) by mouth 3 (three) times daily. (Patient taking differently: Take 8 mg by mouth daily at 12 noon.)   . cinacalcet (SENSIPAR) 30 MG tablet Take 1 tablet by mouth daily.   . clotrimazole (LOTRIMIN) 1 % cream Apply 1 application topically 2 (two) times daily. (Patient taking differently: Apply 1 application topically 2 (two) times daily as needed (skin irritation.).)   . cyclobenzaprine (FLEXERIL) 10 MG tablet Take 1 tablet (10 mg total) by mouth 2 (two) times daily as needed. For back pain   . fluticasone (FLONASE) 50 MCG/ACT nasal spray Place 2 sprays into both nostrils daily.   Marland Kitchen glucose blood (ACCU-CHEK GUIDE) test strip Use as instructed to check blood sugar once daily. E11.22, N18.6, Z99.2   . hydrochlorothiazide (MICROZIDE) 12.5 MG capsule TAKE 1 CAPSULE BY MOUTH EVERY MONDAY, WEDNESDAY, FRIDAY.   . magnesium oxide (MAG-OX) 400 MG tablet Take 2 tablets by mouth 2 (two) times daily.   . meclizine (ANTIVERT) 25 MG tablet Take 25 mg by mouth 3 (three) times daily as needed for dizziness.   . multivitamin (RENA-VIT) TABS tablet Take 1 tablet by mouth daily with lunch.    . pantoprazole (PROTONIX) 40 MG tablet Take 40 mg by mouth 2 (two) times daily.    . phosphorus (K PHOS NEUTRAL) 155-852-130 MG tablet TAKE  2 TABLETS BY MOUTH IN THE MORNING AND AT BEDTIME.   . predniSONE (DELTASONE) 5 MG tablet Take 1 tablet by mouth daily.   Marland Kitchen senna-docusate (SENOKOT-S) 8.6-50 MG tablet Take 1 tablet by mouth 2 (two) times daily.   . sitaGLIPtin (JANUVIA) 50 MG tablet Take by mouth.   . sulfamethoxazole-trimethoprim (BACTRIM) 400-80 MG tablet TAKE 1 TABLET BY MOUTH EVERY MONDAY, WEDNESDAY, FRIDAY.   . Tacrolimus ER 1 MG TB24 Take by mouth.   . valGANciclovir (VALCYTE) 450 MG tablet Take by mouth.   . VELPHORO 500 MG chewable  tablet Chew 500-1,000 mg by mouth See admin instructions. Take 2 tablets (1000 mg) by mouth with each meals & take 1 tablet (500 mg) by mouth with each snack   . budesonide-formoterol (SYMBICORT) 160-4.5 MCG/ACT inhaler Inhale 2 puffs into the lungs 2 (two) times daily for 1 day.    No facility-administered encounter medications on file as of 03/04/2020.    Functional Status:  In your present state of health, do you have any difficulty performing the following activities: 04/27/2019  Hearing? N  Vision? Y  Comment reports blurry vision and floater, has ophthalmologist appnt 04/28/19  Difficulty concentrating or making decisions? N  Walking or climbing stairs? N  Dressing or bathing? N  Doing errands, shopping? N  Preparing Food and eating ? N  Using the Toilet? N  In the past six months, have you accidently leaked urine? N  Do you have problems with loss of bowel control? N  Managing your Medications? N  Managing your Finances? N  Housekeeping or managing your Housekeeping? N  Some recent data might be hidden    Fall/Depression Screening: Fall Risk  03/04/2020 12/19/2019 12/18/2019  Falls in the past year? 0 0 0  Number falls in past yr: 0 - 0  Injury with Fall? 0 - 0  Risk for fall due to : - - -  Risk for fall due to: Comment - - -  Follow up Falls evaluation completed - Falls prevention discussed;Education provided;Falls evaluation completed   PHQ 2/9 Scores 12/19/2019 12/18/2019 06/29/2019 04/27/2019 04/20/2019 04/20/2019 05/24/2018  PHQ - 2 Score 0 0 0 0 0 0 0  PHQ- 9 Score 0 - - - - - 0    Assessment:  Goals Addressed            This Visit's Progress   . Monitor and Manage My Blood Sugar-Diabetes Type 2       Timeframe:  Long-Range Goal Priority:  High Start Date: 03/04/20                         Expected End Date: 03/04/21                     - check blood sugar at prescribed times - check blood sugar if I feel it is too high or too low - enter blood sugar readings and  medication or insulin into daily log - take the blood sugar log to all doctor visits    Why is this important?    Checking your blood sugar at home helps to keep it from getting very high or very low.   Writing the results in a diary or log helps the doctor know how to care for you.   Your blood sugar log should have the time, date and the results.   Also, write down the amount of insulin or other medicine that  you take.   Other information, like what you ate, exercise done and how you were feeling, will also be helpful.     Notes: Patient reports monitoring her blood sugar 2-3 times daily and records the values.    . Patient will maintain A1c below 7 within the next 90 days   On track    [place] (see longitudinal plan of care for additional care plan information)  Objective:  Lab Results  Component Value Date   HGBA1C 5.3 05/23/2019 .   Lab Results  Component Value Date   CREATININE 7.00 (H) 04/18/2019   CREATININE 9.60 (H) 10/05/2018   CREATININE 6.60 (H) 05/12/2018 .   Marland Kitchen No results found for: EGFR  Current Barriers:  Marland Kitchen Knowledge Deficits related to basic Diabetes pathophysiology and self care/management  Case Manager Clinical Goal(s):  Over the next 90 days, patient will demonstrate improved adherence to prescribed treatment plan for diabetes self care/management as evidenced by:  Marland Kitchen Verbalize daily monitoring and recording of CBG within 90 days . Verbalize adherence to ADA/ carb modified diet within the next 90 days . Verbalize exercise 2 days/week . Verbalize adherence to prescribed medication regimen within the next 90 days   Interventions:  . Provided education to patient about basic DM disease process . Provided patient with written educational materials related to hypo and hyperglycemia and importance of correct treatment . Advised patient, providing education and rationale, to check cbg daily and record, calling doctor for findings outside established  parameters.   . Discussed counting carbohydrates, limiting sugary drinks, reading food labels, and increasing physical activity as tolerated  Patient Self Care Activities:  . Attends all scheduled provider appointments . Checks blood sugars as prescribed and utilize hyper and hypoglycemia protocol as needed . Patient is making changes to modify her diet to limit sugar and is motivated to receive education regarding diabetes to improve A1c .  Marland Kitchen Patient is going to a RD and is reading food labels. . Patient reports joining [employer] and plans to start in January.  Please see past updates related to this goal by clicking on the "Past Updates" button in the selected goal  Updated: 03/04/20         Plan: [facility] will send PCP today's assessment note and will call patient within the month of March. Follow-up:  Patient agrees to Care Plan and Follow-up.    Emelia Loron RN, BSN [place] [phone] [email or web address]

## 2020-03-04 NOTE — Patient Instructions (Signed)
Goals    . Monitor and Manage My Blood Sugar-Diabetes Type 2     Timeframe:  Long-Range Goal Priority:  High Start Date: 03/04/20                         Expected End Date: 03/04/21                     - check blood sugar at prescribed times - check blood sugar if I feel it is too high or too low - enter blood sugar readings and medication or insulin into daily log - take the blood sugar log to all doctor visits    Why is this important?    Checking your blood sugar at home helps to keep it from getting very high or very low.   Writing the results in a diary or log helps the doctor know how to care for you.   Your blood sugar log should have the time, date and the results.   Also, write down the amount of insulin or other medicine that you take.   Other information, like what you ate, exercise done and how you were feeling, will also be helpful.     Notes: Patient reports monitoring her blood sugar 2-3 times daily and records the values.    . Patient will maintain A1c below 7 within the next 90 days     [place] (see longitudinal plan of care for additional care plan information)  Objective:  Lab Results  Component Value Date   HGBA1C 5.3 05/23/2019 .   Lab Results  Component Value Date   CREATININE 7.00 (H) 04/18/2019   CREATININE 9.60 (H) 10/05/2018   CREATININE 6.60 (H) 05/12/2018 .   Marland Kitchen No results found for: EGFR  Current Barriers:  Marland Kitchen Knowledge Deficits related to basic Diabetes pathophysiology and self care/management  Case Manager Clinical Goal(s):  Over the next 90 days, patient will demonstrate improved adherence to prescribed treatment plan for diabetes self care/management as evidenced by:  Marland Kitchen Verbalize daily monitoring and recording of CBG within 90 days . Verbalize adherence to ADA/ carb modified diet within the next 90 days . Verbalize exercise 2 days/week . Verbalize adherence to prescribed medication regimen within the next 90 days    Interventions:  . Provided education to patient about basic DM disease process . Provided patient with written educational materials related to hypo and hyperglycemia and importance of correct treatment . Advised patient, providing education and rationale, to check cbg daily and record, calling doctor for findings outside established parameters.   . Discussed counting carbohydrates, limiting sugary drinks, reading food labels, and increasing physical activity as tolerated  Patient Self Care Activities:  . Attends all scheduled provider appointments . Checks blood sugars as prescribed and utilize hyper and hypoglycemia protocol as needed . Patient is making changes to modify her diet to limit sugar and is motivated to receive education regarding diabetes to improve A1c .  Marland Kitchen Patient is going to a RD and is reading food labels. . Patient reports joining [employer] and plans to start in January.  Please see past updates related to this goal by clicking on the "Past Updates" button in the selected goal  Updated: 03/04/20

## 2020-03-07 DIAGNOSIS — M069 Rheumatoid arthritis, unspecified: Secondary | ICD-10-CM | POA: Diagnosis not present

## 2020-03-07 DIAGNOSIS — R609 Edema, unspecified: Secondary | ICD-10-CM | POA: Diagnosis not present

## 2020-03-07 DIAGNOSIS — G4733 Obstructive sleep apnea (adult) (pediatric): Secondary | ICD-10-CM | POA: Diagnosis not present

## 2020-03-07 DIAGNOSIS — E785 Hyperlipidemia, unspecified: Secondary | ICD-10-CM | POA: Diagnosis not present

## 2020-03-07 DIAGNOSIS — I120 Hypertensive chronic kidney disease with stage 5 chronic kidney disease or end stage renal disease: Secondary | ICD-10-CM | POA: Diagnosis not present

## 2020-03-07 DIAGNOSIS — Z9989 Dependence on other enabling machines and devices: Secondary | ICD-10-CM | POA: Diagnosis not present

## 2020-03-07 DIAGNOSIS — D849 Immunodeficiency, unspecified: Secondary | ICD-10-CM | POA: Diagnosis not present

## 2020-03-07 DIAGNOSIS — Z940 Kidney transplant status: Secondary | ICD-10-CM | POA: Diagnosis not present

## 2020-03-07 DIAGNOSIS — Z792 Long term (current) use of antibiotics: Secondary | ICD-10-CM | POA: Diagnosis not present

## 2020-03-07 DIAGNOSIS — Z4822 Encounter for aftercare following kidney transplant: Secondary | ICD-10-CM | POA: Diagnosis not present

## 2020-03-07 DIAGNOSIS — Z79899 Other long term (current) drug therapy: Secondary | ICD-10-CM | POA: Diagnosis not present

## 2020-03-07 DIAGNOSIS — E875 Hyperkalemia: Secondary | ICD-10-CM | POA: Diagnosis not present

## 2020-03-07 DIAGNOSIS — I10 Essential (primary) hypertension: Secondary | ICD-10-CM | POA: Diagnosis not present

## 2020-03-07 DIAGNOSIS — N186 End stage renal disease: Secondary | ICD-10-CM | POA: Diagnosis not present

## 2020-03-07 DIAGNOSIS — Z7952 Long term (current) use of systemic steroids: Secondary | ICD-10-CM | POA: Diagnosis not present

## 2020-03-07 DIAGNOSIS — E1122 Type 2 diabetes mellitus with diabetic chronic kidney disease: Secondary | ICD-10-CM | POA: Diagnosis not present

## 2020-03-07 DIAGNOSIS — R600 Localized edema: Secondary | ICD-10-CM | POA: Diagnosis not present

## 2020-03-13 DIAGNOSIS — I770 Arteriovenous fistula, acquired: Secondary | ICD-10-CM | POA: Diagnosis not present

## 2020-03-13 DIAGNOSIS — N186 End stage renal disease: Secondary | ICD-10-CM | POA: Diagnosis not present

## 2020-03-13 DIAGNOSIS — R600 Localized edema: Secondary | ICD-10-CM | POA: Diagnosis not present

## 2020-04-04 DIAGNOSIS — Z4822 Encounter for aftercare following kidney transplant: Secondary | ICD-10-CM | POA: Diagnosis not present

## 2020-04-04 DIAGNOSIS — I10 Essential (primary) hypertension: Secondary | ICD-10-CM | POA: Diagnosis not present

## 2020-04-04 DIAGNOSIS — D849 Immunodeficiency, unspecified: Secondary | ICD-10-CM | POA: Diagnosis not present

## 2020-04-04 DIAGNOSIS — Z79899 Other long term (current) drug therapy: Secondary | ICD-10-CM | POA: Diagnosis not present

## 2020-04-04 DIAGNOSIS — Z792 Long term (current) use of antibiotics: Secondary | ICD-10-CM | POA: Diagnosis not present

## 2020-04-04 DIAGNOSIS — Z940 Kidney transplant status: Secondary | ICD-10-CM | POA: Diagnosis not present

## 2020-04-04 DIAGNOSIS — E119 Type 2 diabetes mellitus without complications: Secondary | ICD-10-CM | POA: Diagnosis not present

## 2020-04-04 DIAGNOSIS — M25551 Pain in right hip: Secondary | ICD-10-CM | POA: Diagnosis not present

## 2020-04-04 DIAGNOSIS — E875 Hyperkalemia: Secondary | ICD-10-CM | POA: Diagnosis not present

## 2020-04-04 DIAGNOSIS — E785 Hyperlipidemia, unspecified: Secondary | ICD-10-CM | POA: Diagnosis not present

## 2020-04-04 DIAGNOSIS — Z9989 Dependence on other enabling machines and devices: Secondary | ICD-10-CM | POA: Diagnosis not present

## 2020-04-04 DIAGNOSIS — R600 Localized edema: Secondary | ICD-10-CM | POA: Diagnosis not present

## 2020-04-04 DIAGNOSIS — M069 Rheumatoid arthritis, unspecified: Secondary | ICD-10-CM | POA: Diagnosis not present

## 2020-04-04 DIAGNOSIS — D649 Anemia, unspecified: Secondary | ICD-10-CM | POA: Diagnosis not present

## 2020-04-04 DIAGNOSIS — Z7984 Long term (current) use of oral hypoglycemic drugs: Secondary | ICD-10-CM | POA: Diagnosis not present

## 2020-04-04 DIAGNOSIS — G4733 Obstructive sleep apnea (adult) (pediatric): Secondary | ICD-10-CM | POA: Diagnosis not present

## 2020-04-04 DIAGNOSIS — Z7952 Long term (current) use of systemic steroids: Secondary | ICD-10-CM | POA: Diagnosis not present

## 2020-04-30 DIAGNOSIS — H31092 Other chorioretinal scars, left eye: Secondary | ICD-10-CM | POA: Diagnosis not present

## 2020-04-30 DIAGNOSIS — E119 Type 2 diabetes mellitus without complications: Secondary | ICD-10-CM | POA: Diagnosis not present

## 2020-04-30 DIAGNOSIS — H34212 Partial retinal artery occlusion, left eye: Secondary | ICD-10-CM | POA: Diagnosis not present

## 2020-04-30 DIAGNOSIS — H04123 Dry eye syndrome of bilateral lacrimal glands: Secondary | ICD-10-CM | POA: Diagnosis not present

## 2020-04-30 DIAGNOSIS — H2513 Age-related nuclear cataract, bilateral: Secondary | ICD-10-CM | POA: Diagnosis not present

## 2020-04-30 DIAGNOSIS — H35033 Hypertensive retinopathy, bilateral: Secondary | ICD-10-CM | POA: Diagnosis not present

## 2020-04-30 LAB — HM DIABETES EYE EXAM

## 2020-05-06 DIAGNOSIS — D849 Immunodeficiency, unspecified: Secondary | ICD-10-CM | POA: Diagnosis not present

## 2020-05-06 DIAGNOSIS — Z792 Long term (current) use of antibiotics: Secondary | ICD-10-CM | POA: Diagnosis not present

## 2020-05-06 DIAGNOSIS — H3500 Unspecified background retinopathy: Secondary | ICD-10-CM | POA: Diagnosis not present

## 2020-05-06 DIAGNOSIS — I10 Essential (primary) hypertension: Secondary | ICD-10-CM | POA: Diagnosis not present

## 2020-05-06 DIAGNOSIS — M069 Rheumatoid arthritis, unspecified: Secondary | ICD-10-CM | POA: Diagnosis not present

## 2020-05-06 DIAGNOSIS — Z7952 Long term (current) use of systemic steroids: Secondary | ICD-10-CM | POA: Diagnosis not present

## 2020-05-06 DIAGNOSIS — E785 Hyperlipidemia, unspecified: Secondary | ICD-10-CM | POA: Diagnosis not present

## 2020-05-06 DIAGNOSIS — D649 Anemia, unspecified: Secondary | ICD-10-CM | POA: Diagnosis not present

## 2020-05-06 DIAGNOSIS — Z9989 Dependence on other enabling machines and devices: Secondary | ICD-10-CM | POA: Diagnosis not present

## 2020-05-06 DIAGNOSIS — G4733 Obstructive sleep apnea (adult) (pediatric): Secondary | ICD-10-CM | POA: Diagnosis not present

## 2020-05-06 DIAGNOSIS — Z7984 Long term (current) use of oral hypoglycemic drugs: Secondary | ICD-10-CM | POA: Diagnosis not present

## 2020-05-06 DIAGNOSIS — Z4822 Encounter for aftercare following kidney transplant: Secondary | ICD-10-CM | POA: Diagnosis not present

## 2020-05-06 DIAGNOSIS — Z79899 Other long term (current) drug therapy: Secondary | ICD-10-CM | POA: Diagnosis not present

## 2020-05-06 DIAGNOSIS — E875 Hyperkalemia: Secondary | ICD-10-CM | POA: Diagnosis not present

## 2020-05-06 DIAGNOSIS — E119 Type 2 diabetes mellitus without complications: Secondary | ICD-10-CM | POA: Diagnosis not present

## 2020-05-06 DIAGNOSIS — R600 Localized edema: Secondary | ICD-10-CM | POA: Diagnosis not present

## 2020-05-06 DIAGNOSIS — Z940 Kidney transplant status: Secondary | ICD-10-CM | POA: Diagnosis not present

## 2020-05-06 DIAGNOSIS — I708 Atherosclerosis of other arteries: Secondary | ICD-10-CM | POA: Diagnosis not present

## 2020-05-20 ENCOUNTER — Other Ambulatory Visit: Payer: Self-pay

## 2020-05-20 ENCOUNTER — Ambulatory Visit
Admission: RE | Admit: 2020-05-20 | Discharge: 2020-05-20 | Disposition: A | Discharge status: Home / Self Care | Payer: Medicare Other | Source: Ambulatory Visit | Attending: Family Medicine | Admitting: Family Medicine

## 2020-05-20 DIAGNOSIS — Z940 Kidney transplant status: Secondary | ICD-10-CM | POA: Diagnosis not present

## 2020-05-20 DIAGNOSIS — D849 Immunodeficiency, unspecified: Secondary | ICD-10-CM | POA: Diagnosis not present

## 2020-05-20 DIAGNOSIS — Z1231 Encounter for screening mammogram for malignant neoplasm of breast: Secondary | ICD-10-CM

## 2020-06-04 ENCOUNTER — Ambulatory Visit: Payer: Self-pay | Admitting: *Deleted

## 2020-06-04 DIAGNOSIS — E785 Hyperlipidemia, unspecified: Secondary | ICD-10-CM | POA: Diagnosis not present

## 2020-06-04 DIAGNOSIS — Z4822 Encounter for aftercare following kidney transplant: Secondary | ICD-10-CM | POA: Diagnosis not present

## 2020-06-04 DIAGNOSIS — Z7984 Long term (current) use of oral hypoglycemic drugs: Secondary | ICD-10-CM | POA: Diagnosis not present

## 2020-06-04 DIAGNOSIS — E875 Hyperkalemia: Secondary | ICD-10-CM | POA: Diagnosis not present

## 2020-06-04 DIAGNOSIS — Z959 Presence of cardiac and vascular implant and graft, unspecified: Secondary | ICD-10-CM | POA: Diagnosis not present

## 2020-06-04 DIAGNOSIS — Z79899 Other long term (current) drug therapy: Secondary | ICD-10-CM | POA: Diagnosis not present

## 2020-06-04 DIAGNOSIS — G4733 Obstructive sleep apnea (adult) (pediatric): Secondary | ICD-10-CM | POA: Diagnosis not present

## 2020-06-04 DIAGNOSIS — M059 Rheumatoid arthritis with rheumatoid factor, unspecified: Secondary | ICD-10-CM | POA: Diagnosis not present

## 2020-06-04 DIAGNOSIS — J45909 Unspecified asthma, uncomplicated: Secondary | ICD-10-CM | POA: Diagnosis not present

## 2020-06-04 DIAGNOSIS — Z9989 Dependence on other enabling machines and devices: Secondary | ICD-10-CM | POA: Diagnosis not present

## 2020-06-04 DIAGNOSIS — Z792 Long term (current) use of antibiotics: Secondary | ICD-10-CM | POA: Diagnosis not present

## 2020-06-04 DIAGNOSIS — D849 Immunodeficiency, unspecified: Secondary | ICD-10-CM | POA: Diagnosis not present

## 2020-06-04 DIAGNOSIS — R221 Localized swelling, mass and lump, neck: Secondary | ICD-10-CM | POA: Diagnosis not present

## 2020-06-04 DIAGNOSIS — E119 Type 2 diabetes mellitus without complications: Secondary | ICD-10-CM | POA: Diagnosis not present

## 2020-06-04 DIAGNOSIS — Z7951 Long term (current) use of inhaled steroids: Secondary | ICD-10-CM | POA: Diagnosis not present

## 2020-06-04 DIAGNOSIS — D649 Anemia, unspecified: Secondary | ICD-10-CM | POA: Diagnosis not present

## 2020-06-04 DIAGNOSIS — I10 Essential (primary) hypertension: Secondary | ICD-10-CM | POA: Diagnosis not present

## 2020-06-04 DIAGNOSIS — Z940 Kidney transplant status: Secondary | ICD-10-CM | POA: Diagnosis not present

## 2020-06-04 DIAGNOSIS — Z7952 Long term (current) use of systemic steroids: Secondary | ICD-10-CM | POA: Diagnosis not present

## 2020-06-12 ENCOUNTER — Other Ambulatory Visit: Payer: Self-pay | Admitting: *Deleted

## 2020-06-12 ENCOUNTER — Encounter: Payer: Self-pay | Admitting: *Deleted

## 2020-06-12 DIAGNOSIS — R221 Localized swelling, mass and lump, neck: Secondary | ICD-10-CM | POA: Diagnosis not present

## 2020-06-12 NOTE — Patient Outreach (Signed)
[facility]) Care Management  [place]  06/12/2020   AGNESS SIBRIAN 1955-09-23 [id number]  Subjective: Successful telephone outreach call to patient. HIPAA identifiers obtained. Patient states she is doing fairly well. Her hypertension continues to be controlled and she is focused on controlling her diabetes. Patient reports that her FBS ranges are 100-120 and her postprandial ranges are 160-225. Patients goals are to lower her A1c below 6 by limiting her bread intake to 3 times weekly and by continuing to limit the amount of sugar and carbohydrates she consumes. Patient also wants to increase her physical activity but this has been difficult because of her SOB due to asthma and pain in her knee and back. Nurse encouraged patient to discuss this with her PCP during her upcoming visit on 06/17/20. Nurse discussed with the patient that she could do chair exercises and patient stated she would begin doing chair exercises 2 times weekly. Patient states she has not had any recent falls and reports that her home environment is safe. Patient states she needs to make a dental appointment. Nurse discussed that [facility] Medicare should cover as well as Medicaid. Patient states she will call [facility] and Medicaid to see who is in network and nurse will also send other dental resources to patient. Patient did not have any further questions or concerns today and did confirm that she has this nurse's contact number to call her if needed.   Encounter Medications:  Outpatient Encounter Medications as of 06/12/2020  Medication Sig Note  . Accu-Chek Softclix Lancets lancets Use as instructed to check blood sugar once daily. E11.22, N18.6, Z99.2   . acetaminophen (TYLENOL) 650 MG CR tablet Take 650-1,300 mg by mouth every 8 (eight) hours as needed for pain.   Marland Kitchen albuterol (PROVENTIL) (2.5 MG/3ML) 0.083% nebulizer solution Take 3 mLs (2.5 mg total) by nebulization every 6 (six) hours as needed for wheezing  or shortness of breath. ICD 10:J45.20   . albuterol (VENTOLIN HFA) 108 (90 Base) MCG/ACT inhaler INHALE 2 PUFFS BY MOUTH EVERY 6 HOURS AS NEEDED FOR WHEEZE OR SHORTNESS OF BREATH   . amLODipine (NORVASC) 10 MG tablet TAKE 1 TABLET BY MOUTH EVERYDAY AT BEDTIME 12/18/2019: Per pt. 5 mg  . atorvastatin (LIPITOR) 80 MG tablet Take 1 tablet (80 mg total) by mouth daily.   . Blood Glucose Monitoring Suppl (ACCU-CHEK GUIDE) w/Device KIT 1 each by Does not apply route daily. Use as instructed to check blood sugar once daily. E11.22, N18.6, Z99.2   . carvedilol (COREG) 25 MG tablet Take 1 tablet (25 mg total) by mouth 2 (two) times daily with a meal. 12/18/2019: Per pt. 12.5 mg  . chlorpheniramine (CHLOR-TRIMETON) 4 MG tablet Take 1 tablet (4 mg total) by mouth 3 (three) times daily. (Patient taking differently: Take 8 mg by mouth daily at 12 noon.)   . cinacalcet (SENSIPAR) 30 MG tablet Take 1 tablet by mouth daily.   . clotrimazole (LOTRIMIN) 1 % cream Apply 1 application topically 2 (two) times daily. (Patient taking differently: Apply 1 application topically 2 (two) times daily as needed (skin irritation.).)   . cyclobenzaprine (FLEXERIL) 10 MG tablet Take 1 tablet (10 mg total) by mouth 2 (two) times daily as needed. For back pain   . fluticasone (FLONASE) 50 MCG/ACT nasal spray Place 2 sprays into both nostrils daily.   Marland Kitchen glucose blood (ACCU-CHEK GUIDE) test strip Use as instructed to check blood sugar once daily. E11.22, N18.6, Z99.2   . hydrochlorothiazide (  MICROZIDE) 12.5 MG capsule TAKE 1 CAPSULE BY MOUTH EVERY MONDAY, [place], FRIDAY.   . magnesium oxide (MAG-OX) 400 MG tablet Take 2 tablets by mouth 2 (two) times daily.   . meclizine (ANTIVERT) 25 MG tablet Take 25 mg by mouth 3 (three) times daily as needed for dizziness.   . multivitamin (RENA-VIT) TABS tablet Take 1 tablet by mouth daily with lunch.    . pantoprazole (PROTONIX) 40 MG tablet Take 40 mg by mouth 2 (two) times daily.    .  phosphorus (K PHOS NEUTRAL) 155-852-130 MG tablet TAKE 2 TABLETS BY MOUTH IN THE MORNING AND AT BEDTIME.   . predniSONE (DELTASONE) 5 MG tablet Take 1 tablet by mouth daily.   Marland Kitchen senna-docusate (SENOKOT-S) 8.6-50 MG tablet Take 1 tablet by mouth 2 (two) times daily.   . sitaGLIPtin (JANUVIA) 50 MG tablet Take by mouth.   . Tacrolimus ER 1 MG TB24 Take by mouth.   . valGANciclovir (VALCYTE) 450 MG tablet Take by mouth.   . VELPHORO 500 MG chewable tablet Chew 500-1,000 mg by mouth See admin instructions. Take 2 tablets (1000 mg) by mouth with each meals & take 1 tablet (500 mg) by mouth with each snack   . budesonide-formoterol (SYMBICORT) 160-4.5 MCG/ACT inhaler Inhale 2 puffs into the lungs 2 (two) times daily for 1 day.   . sulfamethoxazole-trimethoprim (BACTRIM) 400-80 MG tablet TAKE 1 TABLET BY MOUTH EVERY MONDAY, WEDNESDAY, FRIDAY. (Patient not taking: Reported on 06/12/2020) 06/12/2020: Patient states this is completed   No facility-administered encounter medications on file as of 06/12/2020.    Functional Status:  No flowsheet data found.  Fall/Depression Screening: Fall Risk  06/12/2020 03/04/2020 12/19/2019  Falls in the past year? 0 0 0  Number falls in past yr: 0 0 -  Injury with Fall? 0 0 -  Risk for fall due to : Impaired balance/gait;Impaired mobility - -  Risk for fall due to: Comment - - -  Follow up Falls prevention discussed;Education provided;Falls evaluation completed Falls evaluation completed -   PHQ 2/9 Scores 06/12/2020 12/19/2019 12/18/2019 06/29/2019 04/27/2019 04/20/2019 04/20/2019  PHQ - 2 Score 0 0 0 0 0 0 0  PHQ- 9 Score - 0 - - - - -    Assessment:  Goals Addressed            This Visit's Progress   . [facility]) Patient will continue to monitor her blood sugar daily and record the values for the next 90 days       Timeframe:  Long-Range Goal Priority:  High Start Date: 03/04/20                         Expected End Date: 03/04/21                 Follow Up Date:  09/19/20   - check blood sugar at prescribed times - check blood sugar if I feel it is too high or too low - enter blood sugar readings and medication or insulin into daily log - take the blood sugar log to all doctor visits    Why is this important?    Checking your blood sugar at home helps to keep it from getting very high or very low.   Writing the results in a diary or log helps the doctor know how to care for you.   Your blood sugar log should have the time, date and the results.   Also,  write down the amount of insulin or other medicine that you take.   Other information, like what you ate, exercise done and how you were feeling, will also be helpful.     Notes: Patient reports monitoring her blood sugar 2-3 times daily and records the values.  Updated 06/12/20: Patient states she continues to check her blood sugar 1-2 times daily and she records the values.     . [facility]) Patient will decrease her A1c by .2-.5 within the next 90 days       Timeframe:  Long-Range Goal Priority:  High Start Date:  06/12/20                           Expected End Date: 09/19/20                      Follow Up Date 09/19/20    - set target A1C  - Discussed limiting intake of bread to 3 times weekly -Encouraged patient to monitor diet for sugar and carbohydrates -Discussed decreasing the amount of sugary drinks  Why is this important?    Your target A1C is decided together by you and your doctor.   It is based on several things like your age and other health issues.    Notes: Patient set an A1c goal of < 6 and her current A1c is 6.3. Patient verbalized that she will decrease eating bread to 3 times a week and will continue to limit the intake of sugar and carbohydrates. Patient previously received a planning healthy meals booklet to use as a guide.     Marland Kitchen [facility]) Patient will increase her physical activity by doing chair exercises 2 times weekly within the next 90 days       Timeframe:  Long-Range  Goal Priority:  High Start Date:  06/12/20                           Expected End Date:  06/19/20                     Follow Up Date: 09/19/20  -Discussed doing chair exercises 2 times weekly -Encouraged patient not to sit for long periods -Encouraged patient to discuss her pain and increase SOB due to asthma with her PCP during her upcoming visit 06/17/20  Notes: Patient states she wants to become more active but this has been difficult due to her asthma and pain in her knees and back. Nurse encouraged with patient to discuss her pain and increase SOB due to asthma with her PCP during her upcoming visit 06/17/20. Patient verbalize that she will begin to do chair exercises 2 times weekly. Patient did receive chair exercise printouts previously.    . COMPLETED: Patient will maintain A1c below 7 within the next 90 days       [place] (see longitudinal plan of care for additional care plan information)  Objective:  Lab Results  Component Value Date   HGBA1C 5.3 05/23/2019 .   Lab Results  Component Value Date   CREATININE 7.00 (H) 04/18/2019   CREATININE 9.60 (H) 10/05/2018   CREATININE 6.60 (H) 05/12/2018 .   Marland Kitchen No results found for: EGFR  Current Barriers:  Marland Kitchen Knowledge Deficits related to basic Diabetes pathophysiology and self care/management  Case Manager Clinical Goal(s):  Over the next 90 days, patient will demonstrate improved adherence to  prescribed treatment plan for diabetes self care/management as evidenced by:  Marland Kitchen Verbalize daily monitoring and recording of CBG within 90 days . Verbalize adherence to ADA/ carb modified diet within the next 90 days . Verbalize exercise 2 days/week . Verbalize adherence to prescribed medication regimen within the next 90 days   Interventions:  . Provided education to patient about basic DM disease process . Provided patient with written educational materials related to hypo and hyperglycemia and importance of correct  treatment . Advised patient, providing education and rationale, to check cbg daily and record, calling doctor for findings outside established parameters.   . Discussed counting carbohydrates, limiting sugary drinks, reading food labels, and increasing physical activity as tolerated  Patient Self Care Activities:  . Attends all scheduled provider appointments . Checks blood sugars as prescribed and utilize hyper and hypoglycemia protocol as needed . Patient is making changes to modify her diet to limit sugar and is motivated to receive education regarding diabetes to improve A1c .  Marland Kitchen Patient is going to a RD and is reading food labels. . Patient reports joining [employer] and plans to start in January.  Please see past updates related to this goal by clicking on the "Past Updates" button in the selected goal  Updated: 03/04/20 Resolved due to duplicate goals       Plan: [place] will send PCP a quarterly update, will send patient dental resources, and will call patient within the month of June. Follow-up:  Patient agrees to Care Plan and Follow-up.  Emelia Loron RN, BSN [place] [phone] [email or web address]

## 2020-06-12 NOTE — Patient Instructions (Addendum)
Goals Addressed            This Visit's Progress   . [facility]) Patient will continue to monitor her blood sugar daily and record the values for the next 90 days       Timeframe:  Long-Range Goal Priority:  High Start Date: 03/04/20                         Expected End Date: 03/04/21                 Follow Up Date: 09/19/20   - check blood sugar at prescribed times - check blood sugar if I feel it is too high or too low - enter blood sugar readings and medication or insulin into daily log - take the blood sugar log to all doctor visits    Why is this important?    Checking your blood sugar at home helps to keep it from getting very high or very low.   Writing the results in a diary or log helps the doctor know how to care for you.   Your blood sugar log should have the time, date and the results.   Also, write down the amount of insulin or other medicine that you take.   Other information, like what you ate, exercise done and how you were feeling, will also be helpful.     Notes: Patient reports monitoring her blood sugar 2-3 times daily and records the values.  Updated 06/12/20: Patient states she continues to check her blood sugar 1-2 times daily and she records the values.     . [facility]) Patient will decrease her A1c by .2-.5 within the next 90 days       Timeframe:  Long-Range Goal Priority:  High Start Date:  06/12/20                           Expected End Date: 09/19/20                      Follow Up Date 09/19/20    - set target A1C  - Discussed limiting intake of bread to 3 times weekly -Encouraged patient to monitor diet for sugar and carbohydrates -Discussed decreasing the amount of sugary drinks  Why is this important?    Your target A1C is decided together by you and your doctor.   It is based on several things like your age and other health issues.    Notes: Patient set an A1c goal of < 6 and her current A1c is 6.3. Patient verbalized that she will decrease eating  bread to 3 times a week and will continue to limit the intake of sugar and carbohydrates. Patient previously received a planning healthy meals booklet to use as a guide.     Marland Kitchen [facility]) Patient will increase her physical activity by doing chair exercises 2 times weekly within the next 90 days       Timeframe:  Long-Range Goal Priority:  High Start Date:  06/12/20                           Expected End Date:  06/19/20                     Follow Up Date: 09/19/20  -Discussed doing chair exercises 2 times weekly -Encouraged patient not  to sit for long periods -Encouraged patient to discuss her pain and increase SOB due to asthma with her PCP during her upcoming visit 06/17/20  Notes: Patient states she wants to become more active but this has been difficult due to her asthma and pain in her knees and back. Nurse encouraged with patient to discuss her pain and increase SOB due to asthma with her PCP during her upcoming visit 06/17/20. Patient verbalize that she will begin to do chair exercises 2 times weekly. Patient did receive chair exercise printouts previously.    . COMPLETED: Patient will maintain A1c below 7 within the next 90 days       [employer] (see longitudinal plan of care for additional care plan information)  Objective:  Lab Results  Component Value Date   HGBA1C 5.3 05/23/2019 .   Lab Results  Component Value Date   CREATININE 7.00 (H) 04/18/2019   CREATININE 9.60 (H) 10/05/2018   CREATININE 6.60 (H) 05/12/2018 .   Marland Kitchen No results found for: EGFR  Current Barriers:  Marland Kitchen Knowledge Deficits related to basic Diabetes pathophysiology and self care/management  Case Manager Clinical Goal(s):  Over the next 90 days, patient will demonstrate improved adherence to prescribed treatment plan for diabetes self care/management as evidenced by:  Marland Kitchen Verbalize daily monitoring and recording of CBG within 90 days . Verbalize adherence to ADA/ carb modified diet within the next 90  days . Verbalize exercise 2 days/week . Verbalize adherence to prescribed medication regimen within the next 90 days   Interventions:  . Provided education to patient about basic DM disease process . Provided patient with written educational materials related to hypo and hyperglycemia and importance of correct treatment . Advised patient, providing education and rationale, to check cbg daily and record, calling doctor for findings outside established parameters.   . Discussed counting carbohydrates, limiting sugary drinks, reading food labels, and increasing physical activity as tolerated  Patient Self Care Activities:  . Attends all scheduled provider appointments . Checks blood sugars as prescribed and utilize hyper and hypoglycemia protocol as needed . Patient is making changes to modify her diet to limit sugar and is motivated to receive education regarding diabetes to improve A1c .  Marland Kitchen Patient is going to a RD and is reading food labels. . Patient reports joining [employer] and plans to start in January.  Please see past updates related to this goal by clicking on the "Past Updates" button in the selected goal  Updated: 03/04/20 Resolved due to duplicate goals

## 2020-06-17 ENCOUNTER — Ambulatory Visit: Payer: Medicare Other | Attending: Family Medicine | Admitting: Family Medicine

## 2020-06-17 ENCOUNTER — Other Ambulatory Visit: Payer: Self-pay

## 2020-06-17 ENCOUNTER — Encounter: Payer: Self-pay | Admitting: Family Medicine

## 2020-06-17 VITALS — BP 134/76 | HR 82 | Ht 62.0 in | Wt 207.0 lb

## 2020-06-17 DIAGNOSIS — I890 Lymphedema, not elsewhere classified: Secondary | ICD-10-CM | POA: Diagnosis not present

## 2020-06-17 DIAGNOSIS — E1122 Type 2 diabetes mellitus with diabetic chronic kidney disease: Secondary | ICD-10-CM

## 2020-06-17 DIAGNOSIS — I129 Hypertensive chronic kidney disease with stage 1 through stage 4 chronic kidney disease, or unspecified chronic kidney disease: Secondary | ICD-10-CM

## 2020-06-17 DIAGNOSIS — Z940 Kidney transplant status: Secondary | ICD-10-CM

## 2020-06-17 DIAGNOSIS — R221 Localized swelling, mass and lump, neck: Secondary | ICD-10-CM

## 2020-06-17 DIAGNOSIS — R0609 Other forms of dyspnea: Secondary | ICD-10-CM | POA: Diagnosis not present

## 2020-06-17 DIAGNOSIS — N1831 Chronic kidney disease, stage 3a: Secondary | ICD-10-CM | POA: Diagnosis not present

## 2020-06-17 DIAGNOSIS — J4520 Mild intermittent asthma, uncomplicated: Secondary | ICD-10-CM | POA: Diagnosis not present

## 2020-06-17 DIAGNOSIS — N183 Chronic kidney disease, stage 3 unspecified: Secondary | ICD-10-CM

## 2020-06-17 LAB — POCT GLYCOSYLATED HEMOGLOBIN (HGB A1C): HbA1c, POC (controlled diabetic range): 6.7 % (ref 0.0–7.0)

## 2020-06-17 LAB — GLUCOSE, POCT (MANUAL RESULT ENTRY): POC Glucose: 121 mg/dl — AB (ref 70–99)

## 2020-06-17 MED ORDER — TRULICITY 0.75 MG/0.5ML ~~LOC~~ SOAJ: 0.7500 mg | SUBCUTANEOUS | 6 refills | Status: DC

## 2020-06-17 MED ORDER — PANTOPRAZOLE SODIUM 40 MG PO TBEC: 40.0000 mg | DELAYED_RELEASE_TABLET | Freq: Two times a day (BID) | ORAL | 6 refills | Status: DC

## 2020-06-17 MED ORDER — ATORVASTATIN CALCIUM 80 MG PO TABS: 80.0000 mg | ORAL_TABLET | Freq: Every day | ORAL | 6 refills | Status: DC

## 2020-06-17 MED ORDER — ALBUTEROL SULFATE HFA 108 (90 BASE) MCG/ACT IN AERS
INHALATION_SPRAY | RESPIRATORY_TRACT | 1 refills | Status: AC
Start: 2020-06-17 — End: ?

## 2020-06-17 NOTE — Progress Notes (Signed)
Subjective:  Patient ID: Rachael George, female    DOB: [birth year]  Age: 65 y.o. MRN: [id number]  CC: Diabetes   HPI Rachael George is a 65 yr old female with a past medical historyof hypertension, type 2 diabetes mellutlits(A1c 6.7), Gout, ESRD status post R kidney transplant in 07/2019 at [facility] currently on Sensipar, mycophenolate, Tacrolimus and Valganciclovir, Prednisone - notes from care everywhere reviewed.  She has noticed increasing edema of the left side of her neck radiating to her left arm for the last 4 months. Seen at [place] and left upper extremity Doppler was negative for DVT.  Ultrasound of the head and neck ordered was as follows: [place] HEAD NECK SOFT TISSUE THYROID, 06/12/2020 8:29 AM  INDICATION:swelling in left supraclavicular region \ Z94.0 Deceased-donor kidney transplant \ R22.1 Localized swelling, mass and lump, neck  ADDITIONAL HISTORY: None. COMPARISON: None.  TECHNIQUE: Real-time, multiplanar grayscale ultrasound was performed at the indicated area of concern, supplemented by color Doppler as needed.   FINDINGS:  Targeted interrogation of the left supraclavicular region demonstrates normal subcutaneous tissues with scattered normal size and appearing lymph nodes. Trace subcutaneous edema.   She has been short of breath especially with walking over the last 1.5 months.  Denies presence of chest pain.  Of note she has gained 37 pounds in the last 6 months.  She informs me her prednisone dose was decreased to 5 mg daily. After May she will no longer follow up with the transplant team at [facility] but will be transferred to her Nephrologist.  Her A1c is at 6.7 which is up from 6.2 previously.  She is currently on Januvia which was prescribed at [facility]. Past Medical History:  Diagnosis Date  . Anemia   . Asthma   . ESRD (end stage renal disease) ([place])    MWF  . GERD (gastroesophageal reflux  disease)   . Gout   . 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 Heavy menstrual bleeding   . High cholesterol   . History of blood transfusion 02/2016- last one   1990's- several ones   . History of hiatal hernia   . History of kidney stones 1980's  . Hyperlipemia 12/07/2012  . Hypertension   . Hypothyroidism   . Multiple gastric ulcers   . Pneumonia 2012  . Pre-diabetes   . Prediabetes   . RA (rheumatoid arthritis) ([place])   . Sleep apnea    [place]- have CPAP, not able to use every night     Past Surgical History:  Procedure Laterality Date  . A/V SHUNTOGRAM N/A 09/08/2016   Procedure: A/V Shuntogram - Left Arm AV Graft;  Surgeon: Serafina Mitchell, MD;  Location: [place] [facility];  Service: Cardiovascular;  Laterality: N/A;  . A/V SHUNTOGRAM N/A 09/02/2017   Procedure: A/V SHUNTOGRAM - Left Arm AVG;  Surgeon: Conrad [place], MD;  Location: [place] [facility];  Service: Cardiovascular;  Laterality: N/A;  . A/V SHUNTOGRAM N/A 12/21/2017   Procedure: A/V SHUNTOGRAM - left arm;  Surgeon: Serafina Mitchell, MD;  Location: [place] [facility];  Service: Cardiovascular;  Laterality: N/A;  . A/V SHUNTOGRAM Left 10/05/2018   Procedure: A/V SHUNTOGRAM;  Surgeon: Marty Heck, MD;  Location: [place] [facility];  Service: Cardiovascular;  Laterality: Left;  . ABDOMINAL HYSTERECTOMY  2000  . AV FISTULA PLACEMENT Left 05/25/2016   Procedure: INSERTION  OF LEFT UPPER ARM ARTERIOVENOUS (AV) LOOP GORE-TEX GRAFT ARM;  Surgeon: Elam Dutch, MD;  Location: [place];  Service: Vascular;  Laterality: Left;  . BASCILIC VEIN TRANSPOSITION Right 12/06/2014   Procedure: FIRST STAGE BASILIC VEIN TRANSPOSITION - RIGHT;  Surgeon: Serafina Mitchell, MD;  Location: [place];  Service: Vascular;  Laterality: Right;  . BASCILIC VEIN TRANSPOSITION Right 02/07/2015   Procedure: RIGHT ARM SECOND STAGE BASILIC VEIN TRANSPOSITION;  Surgeon: Serafina Mitchell, MD;  Location: [place];  Service: Vascular;   Laterality: Right;  . BASCILIC VEIN TRANSPOSITION Left 04/22/2016   Procedure: FIRST STAGE BASILIC VEIN TRANSPOSITION LEFT ARM;  Surgeon: Conrad [place], MD;  Location: [place];  Service: Vascular;  Laterality: Left;  . COLONOSCOPY W/ POLYPECTOMY    . ECTOPIC PREGNANCY SURGERY  02/1982  . EXCHANGE OF A DIALYSIS CATHETER Right 04/22/2016   Procedure: EXCHANGE OF A DIALYSIS CATHETER - INSERTION RIGHT INTERNAL JUGULAR & REMOVAL FROM LEFT INTERNAL JUGULAR;  Surgeon: Conrad [place], MD;  Location: [place];  Service: Vascular;  Laterality: Right;  . I & D EXTREMITY Right 02/07/2015   Procedure: IRRIGATION AND DEBRIDEMENT RIGHT ARM HEMATOMA;  Surgeon: Serafina Mitchell, MD;  Location: [place];  Service: Vascular;  Laterality: Right;  . INSERTION OF DIALYSIS CATHETER  03/03/2016   Procedure: INSERTION OF DIALYSIS CATHETER;  Surgeon: Elam Dutch, MD;  Location: [place];  Service: Vascular;;  . IR AV DIALY SHUNT INTRO NEEDLE/INTRACATH INITIAL W/PTA/IMG LEFT  04/20/2017  . IR AV DIALY SHUNT INTRO NEEDLE/INTRACATH INITIAL W/PTA/IMG LEFT  06/24/2017  . IR THROMBECTOMY AV FISTULA W/THROMBOLYSIS/PTA INC/SHUNT/IMG LEFT Left 01/04/2017  . IR US GUIDE VASC ACCESS LEFT  01/04/2017  . left shoulder surgery  08/2005  . LIGATION OF ARTERIOVENOUS  FISTULA  03/03/2016   Procedure: LIGATION OF ARTERIOVENOUS  FISTULA;  Surgeon: Elam Dutch, MD;  Location: [facility] OR;  Service: Vascular;;  . LIGATION OF ARTERIOVENOUS  FISTULA Right 09/06/2018   Procedure: Resection of pseudoaneursym and end to end repair of right brachial artery;  Surgeon: Elam Dutch, MD;  Location: [facility];  Service: Vascular;  Laterality: Right;  . PERIPHERAL VASCULAR BALLOON ANGIOPLASTY  09/08/2016   Procedure: Peripheral Vascular Balloon Angioplasty;  Surgeon: Serafina Mitchell, MD;  Location: [place] [facility];  Service: Cardiovascular;;  left avf  . PERIPHERAL VASCULAR BALLOON ANGIOPLASTY  09/02/2017   Procedure: PERIPHERAL VASCULAR BALLOON ANGIOPLASTY;   Surgeon: Conrad Hubbard, MD;  Location: [place] [facility];  Service: Cardiovascular;;  left AV Graft  . PERIPHERAL VASCULAR BALLOON ANGIOPLASTY  12/21/2017   Procedure: PERIPHERAL VASCULAR BALLOON ANGIOPLASTY;  Surgeon: Serafina Mitchell, MD;  Location: [place] [facility];  Service: Cardiovascular;;  INOMINATE / UPPER ARM AV GRAFT  . PERIPHERAL VASCULAR BALLOON ANGIOPLASTY Left 05/12/2018   Procedure: PERIPHERAL VASCULAR BALLOON ANGIOPLASTY;  Surgeon: Marty Heck, MD;  Location: [place] [facility];  Service: Cardiovascular;  Laterality: Left;  Arm fistula  . PERIPHERAL VASCULAR BALLOON ANGIOPLASTY Left 10/05/2018   Procedure: PERIPHERAL VASCULAR BALLOON ANGIOPLASTY;  Surgeon: Marty Heck, MD;  Location: [place] [facility];  Service: Cardiovascular;  Laterality: Left;  central and peripheral vein  . PERIPHERAL VASCULAR BALLOON ANGIOPLASTY Left 04/18/2019   Procedure: PERIPHERAL VASCULAR BALLOON ANGIOPLASTY;  Surgeon: Serafina Mitchell, MD;  Location: [place] [facility];  Service: Cardiovascular;  Laterality: Left;  arm fistula   . TEE WITHOUT CARDIOVERSION N/A 03/06/2016   Procedure: TRANSESOPHAGEAL ECHOCARDIOGRAM (TEE);  Surgeon:  Sueanne Margarita, MD;  Location: [place];  Service: Cardiovascular;  Laterality: N/A;  . TUBAL LIGATION  11/1986    Family History  Problem Relation Age of Onset  . Hypertension Mother   . Breast cancer Maternal Aunt   . Deep vein thrombosis Daughter   . Hyperlipidemia Daughter   . Hypertension Daughter   . Prostate cancer Maternal Grandmother     Allergies  Allergen Reactions  . Atacand [Candesartan] Anaphylaxis and Other (See Comments)    Swelling of the mouth and tongue.  Marland Kitchen Lisinopril Anaphylaxis and Other (See Comments)    Swelling of the mouth and tongue.  . Nsaids Anaphylaxis and Other (See Comments)    Swelling of the mouth and tongue.  Marland Kitchen Penicillins Rash and Other (See Comments)    Has patient had a PCN reaction causing immediate rash,  facial/tongue/throat swelling, SOB or lightheadedness with hypotension: No Has patient had a PCN reaction causing severe rash involving mucus membranes or skin necrosis: No Has patient had a PCN reaction that required hospitalization No Has patient had a PCN reaction occurring within the last 10 years: No If all of the above answers are "NO", then may proceed with Cephalosporin use.     Outpatient Medications Prior to Visit  Medication Sig Dispense Refill  . Accu-Chek Softclix Lancets lancets Use as instructed to check blood sugar once daily. E11.22, N18.6, Z99.2 100 each 8  . acetaminophen (TYLENOL) 650 MG CR tablet Take 650-1,300 mg by mouth every 8 (eight) hours as needed for pain.    Marland Kitchen albuterol (PROVENTIL) (2.5 MG/3ML) 0.083% nebulizer solution Take 3 mLs (2.5 mg total) by nebulization every 6 (six) hours as needed for wheezing or shortness of breath. ICD 10:J45.20 75 mL 0  . amLODipine (NORVASC) 10 MG tablet TAKE 1 TABLET BY MOUTH EVERYDAY AT BEDTIME 30 tablet 0  . Blood Glucose Monitoring Suppl (ACCU-CHEK GUIDE) w/Device KIT 1 each by Does not apply route daily. Use as instructed to check blood sugar once daily. E11.22, N18.6, Z99.2 1 kit 0  . carvedilol (COREG) 25 MG tablet Take 1 tablet (25 mg total) by mouth 2 (two) times daily with a meal. 60 tablet 6  . chlorpheniramine (CHLOR-TRIMETON) 4 MG tablet Take 1 tablet (4 mg total) by mouth 3 (three) times daily. (Patient taking differently: Take 8 mg by mouth daily at 12 noon.) 190 tablet 0  . cinacalcet (SENSIPAR) 30 MG tablet Take 1 tablet by mouth daily.    . clotrimazole (LOTRIMIN) 1 % cream Apply 1 application topically 2 (two) times daily. (Patient taking differently: Apply 1 application topically 2 (two) times daily as needed (skin irritation.).) 30 g 1  . cyclobenzaprine (FLEXERIL) 10 MG tablet Take 1 tablet (10 mg total) by mouth 2 (two) times daily as needed. For back pain 60 tablet 2  . fluticasone (FLONASE) 50 MCG/ACT nasal  spray Place 2 sprays into both nostrils daily. 16 g 6  . glucose blood (ACCU-CHEK GUIDE) test strip Use as instructed to check blood sugar once daily. E11.22, N18.6, Z99.2 30 each 8  . hydrochlorothiazide (MICROZIDE) 12.5 MG capsule TAKE 1 CAPSULE BY MOUTH EVERY MONDAY, WEDNESDAY, FRIDAY.    . magnesium oxide (MAG-OX) 400 MG tablet Take 2 tablets by mouth 2 (two) times daily.    . multivitamin (RENA-VIT) TABS tablet Take 1 tablet by mouth daily with lunch.     . phosphorus (K PHOS NEUTRAL) 155-852-130 MG tablet TAKE 2 TABLETS BY MOUTH IN THE MORNING AND AT  BEDTIME.    . predniSONE (DELTASONE) 5 MG tablet Take 1 tablet by mouth daily.    Marland Kitchen senna-docusate (SENOKOT-S) 8.6-50 MG tablet Take 1 tablet by mouth 2 (two) times daily.    . Tacrolimus ER 1 MG TB24 Take by mouth.    . valGANciclovir (VALCYTE) 450 MG tablet Take by mouth.    . VELPHORO 500 MG chewable tablet Chew 500-1,000 mg by mouth See admin instructions. Take 2 tablets (1000 mg) by mouth with each meals & take 1 tablet (500 mg) by mouth with each snack    . albuterol (VENTOLIN HFA) 108 (90 Base) MCG/ACT inhaler INHALE 2 PUFFS BY MOUTH EVERY 6 HOURS AS NEEDED FOR WHEEZE OR SHORTNESS OF BREATH 54 each 1  . atorvastatin (LIPITOR) 80 MG tablet Take 1 tablet (80 mg total) by mouth daily. 30 tablet 6  . pantoprazole (PROTONIX) 40 MG tablet Take 40 mg by mouth 2 (two) times daily.     . sitaGLIPtin (JANUVIA) 50 MG tablet Take by mouth.    . budesonide-formoterol (SYMBICORT) 160-4.5 MCG/ACT inhaler Inhale 2 puffs into the lungs 2 (two) times daily for 1 day. 3 each 1  . meclizine (ANTIVERT) 25 MG tablet Take 25 mg by mouth 3 (three) times daily as needed for dizziness. (Patient not taking: Reported on 06/17/2020)    . sulfamethoxazole-trimethoprim (BACTRIM) 400-80 MG tablet TAKE 1 TABLET BY MOUTH EVERY MONDAY, WEDNESDAY, FRIDAY. (Patient not taking: No sig reported)     No facility-administered medications prior to visit.     ROS Review of  Systems  Constitutional: Negative for activity change, appetite change and fatigue.  HENT: Negative for congestion, sinus pressure and sore throat.   Eyes: Negative for visual disturbance.  Respiratory: Positive for shortness of breath. Negative for cough, chest tightness and wheezing.   Cardiovascular: Negative for chest pain and palpitations.  Gastrointestinal: Negative for abdominal distention, abdominal pain and constipation.  Endocrine: Negative for polydipsia.  Genitourinary: Negative for dysuria and frequency.  Musculoskeletal: Negative for arthralgias and back pain.  Skin: Negative for rash.  Neurological: Negative for tremors, light-headedness and numbness.  Hematological: Does not bruise/bleed easily.  Psychiatric/Behavioral: Negative for agitation and behavioral problems.   Diabetic Foot Exam - Simple   Simple Foot Form Visual Inspection See comments: Yes Sensation Testing Intact to touch and monofilament testing bilaterally: Yes Pulse Check Posterior Tibialis and Dorsalis pulse intact bilaterally: Yes Comments Callus on heel of both feet.  No ulceration or skin breakdown     Objective:  BP 134/76   Pulse 82   Ht 5' 2"  (1.575 m)   Wt 207 lb (93.9 kg)   SpO2 100%   BMI 37.86 kg/m   BP/Weight 06/17/2020 11/03/4816 07/27/3147  Systolic BP 702 637 858  Diastolic BP 76 69 83  Wt. (Lbs) 207 170.4 163  BMI 37.86 31.17 29.81         CMP Latest Ref Rng & Units 12/19/2019 04/18/2019 10/05/2018  Glucose 65 - 99 mg/dL 127(H) 87 88  BUN 8 - 27 mg/dL 26 29(H) 56(H)  Creatinine 0.57 - 1.00 mg/dL 1.25(H) 7.00(H) 9.60(H)  Sodium 134 - 144 mmol/L 139 138 137  Potassium 3.5 - 5.2 mmol/L 4.4 4.3 4.7  Chloride 96 - 106 mmol/L 106 98 100  CO2 20 - 29 mmol/L 21 - -  Calcium 8.7 - 10.3 mg/dL 10.2 - -  Total Protein 6.0 - 8.5 g/dL 7.2 - -  Total Bilirubin 0.0 - 1.2 mg/dL 0.2 - -  Alkaline Phos 44 - 121 IU/L 269(H) - -  AST 0 - 40 IU/L 22 - -  ALT 0 - 32 IU/L 29 - -     Lipid Panel     Component Value Date/Time   CHOL 146 12/19/2019 1025   TRIG 140 12/19/2019 1025   HDL 42 12/19/2019 1025   CHOLHDL 5.1 (H) 05/26/2018 0755   CHOLHDL 4.6 09/02/2015 0958   VLDL 60 (H) 09/02/2015 0958   LDLCALC 79 12/19/2019 1025   LDLDIRECT 130.7 04/05/2013 0850    CBC    Component Value Date/Time   WBC 4.4 12/19/2019 1025   WBC 7.9 02/11/2018 1238   RBC 4.39 12/19/2019 1025   RBC 3.93 02/11/2018 1238   HGB 11.4 12/19/2019 1025   HCT 35.4 12/19/2019 1025   PLT 145 (L) 12/19/2019 1025   MCV 81 12/19/2019 1025   MCH 26.0 (L) 12/19/2019 1025   MCH 24.7 (L) 02/11/2018 1238   MCHC 32.2 12/19/2019 1025   MCHC 31.8 02/11/2018 1238   RDW 13.8 12/19/2019 1025   LYMPHSABS 0.7 12/19/2019 1025   MONOABS 0.5 02/11/2018 1238   EOSABS 0.1 12/19/2019 1025   BASOSABS 0.0 12/19/2019 1025    Lab Results  Component Value Date   HGBA1C 6.7 06/17/2020    Assessment & Plan:  1. Type 2 diabetes mellitus with stage 3a chronic kidney disease, without long-term current use of insulin (HCC) Controlled with A1c of 6.7 but this has trended up from 6.2 previously Chronic steroid use could be contributory Trulicity added to regimen; discontinue Januvia Clinical pharmacist called in to provide education on administration of Trulicity Counseled on Diabetic diet, my plate method, 742 minutes of moderate intensity exercise/week Blood sugar logs with fasting goals of 80-120 mg/dl, random of less than 180 and in the event of sugars less than 60 mg/dl or greater than 400 mg/dl encouraged to notify the clinic. Advised on the need for annual eye exams, annual foot exams, Pneumonia vaccine. - POCT glucose (manual entry) - POCT glycosylated hemoglobin (Hb A1C) - Dulaglutide (TRULICITY) 5.95 GL/8.7FI SOPN; Inject 0.75 mg into the skin once a week.  Dispense: 2 mL; Refill: 6 - Microalbumin / creatinine urine ratio - CMP14+EGFR - Lipid panel - atorvastatin (LIPITOR) 80 MG tablet; Take 1  tablet (80 mg total) by mouth daily.  Dispense: 30 tablet; Refill: 6  2. Other form of dyspnea Underlying history of asthma We will need to exclude SVC syndrome versus medication induced cardiomyopathy especially given presence of elevated JVD Weight gain will also be contributory - DG Chest 2 View; Future - Brain natriuretic peptide  3. Localized swelling, mass and lump, neck Neck ultrasound revealed trace edema from outside records - Ambulatory referral to Vascular Surgery - VAS US CAROTID; Future  4. Lymphedema of left arm See #3 above - VAS US CAROTID; Future  5. Kidney transplant recipient High risk patient currently on immunosuppressants Medications adverse effects could cause cardiomyopathy hence will order echocardiogram We will also order labs   6. Mild intermittent asthma without complication Stable - albuterol (VENTOLIN HFA) 108 (90 Base) MCG/ACT inhaler; INHALE 2 PUFFS BY MOUTH EVERY 6 HOURS AS NEEDED FOR WHEEZE OR SHORTNESS OF BREATH  Dispense: 54 each; Refill: 1  7. Hypertension associated with stage 3 chronic kidney disease due to type 2 diabetes mellitus ([place]) Controlled Counseled on blood pressure goal of less than 130/80, low-sodium, DASH diet, medication compliance, 150 minutes of moderate intensity exercise per week. Discussed medication compliance, adverse effects.  Continue current medication   Meds ordered this encounter  Medications  . Dulaglutide (TRULICITY) 4.12 IN/8.6VE SOPN    Sig: Inject 0.75 mg into the skin once a week.    Dispense:  2 mL    Refill:  6  . pantoprazole (PROTONIX) 40 MG tablet    Sig: Take 1 tablet (40 mg total) by mouth 2 (two) times daily.    Dispense:  30 tablet    Refill:  6  . atorvastatin (LIPITOR) 80 MG tablet    Sig: Take 1 tablet (80 mg total) by mouth daily.    Dispense:  30 tablet    Refill:  6  . albuterol (VENTOLIN HFA) 108 (90 Base) MCG/ACT inhaler    Sig: INHALE 2 PUFFS BY MOUTH EVERY 6 HOURS AS NEEDED FOR  WHEEZE OR SHORTNESS OF BREATH    Dispense:  54 each    Refill:  1    [place] refill, must attend upcoming appt.    Follow-up: Return in about 1 month (around 07/18/2020) for Follow-up plan neck swelling.       Charlott Rakes, MD, FAAFP. [facility] and [facility] [place], [place]   06/17/2020, 10:54 AM

## 2020-06-17 NOTE — Progress Notes (Signed)
Having swelling from neck down left arm. Swelling in right leg. Having SOB when walking. Fasting took morning meds.

## 2020-06-17 NOTE — Patient Instructions (Signed)
Dulaglutide Injection What is this medicine? DULAGLUTIDE (DOO la GLOO tide) controls blood sugar in people with type 2 diabetes. It is used with lifestyle changes like diet and exercise. It may lower the risk of problems that need treatment in the hospital. These problems include heart attack or stroke. This medicine may be used for other purposes; ask your health care provider or pharmacist if you have questions. COMMON BRAND NAME(S): Trulicity What should I tell my health care provider before I take this medicine? They need to know if you have any of these conditions:  endocrine tumors (MEN 2) or if someone in your family had these tumors  eye disease, vision problems  history of pancreatitis  kidney disease  liver disease  stomach or intestine problems  thyroid cancer or if someone in your family had thyroid cancer  an unusual or allergic reaction to dulaglutide, other medicines, foods, dyes, or preservatives  pregnant or trying to get pregnant  breast-feeding How should I use this medicine? This medicine is injected under the skin. You will be taught how to prepare and give it. Take it as directed on the prescription label on the same day of each week. Do NOT prime the pen. Keep taking it unless your health care provider tells you to stop. If you use this medicine with insulin, you should inject this medicine and the insulin separately. Do not mix them together. Do not give the injections right next to each other. Change (rotate) injection sites with each injection. This drug comes with INSTRUCTIONS FOR USE. Ask your pharmacist for directions on how to use this medicine. Read the information carefully. Talk to your pharmacist or health care provider if you have questions. It is important that you put your used needles and syringes in a special sharps container. Do not put them in a trash can. If you do not have a sharps container, call your pharmacist or health care provider to get  one. A special MedGuide will be given to you by the pharmacist with each prescription and refill. Be sure to read this information carefully each time. Talk to your health care provider about the use of this medicine in children. Special care may be needed. Overdosage: If you think you have taken too much of this medicine contact a poison control center or emergency room at once. NOTE: This medicine is only for you. Do not share this medicine with others. What if I miss a dose? If you miss a dose, take it as soon as you can unless it is more than 3 days late. If it is more than 3 days late, skip the missed dose. Take the next dose at the normal time. What may interact with this medicine?  other medicines for diabetes Many medications may cause changes in blood sugar, these include:  alcohol containing beverages  antiviral medicines for HIV or AIDS  aspirin and aspirin-like drugs  certain medicines for blood pressure, heart disease, irregular heart beat  chromium  diuretics  female hormones, such as estrogens or progestins, birth control pills  fenofibrate  gemfibrozil  isoniazid  lanreotide  female hormones or anabolic steroids  MAOIs like Carbex, Eldepryl, Marplan, Nardil, and Parnate  medicines for allergies, asthma, cold, or cough  medicines for depression, anxiety, or psychotic disturbances  medicines for weight loss  niacin  nicotine  NSAIDs, medicines for pain and inflammation, like ibuprofen or naproxen  octreotide  pasireotide  pentamidine  phenytoin  probenecid  quinolone antibiotics such as ciprofloxacin,  levofloxacin, ofloxacin  some herbal dietary supplements  steroid medicines such as prednisone or cortisone  sulfamethoxazole; trimethoprim  thyroid hormones Some medications can hide the warning symptoms of low blood sugar (hypoglycemia). You may need to monitor your blood sugar more closely if you are taking one of these medications.  These include:  beta-blockers, often used for high blood pressure or heart problems (examples include atenolol, metoprolol, propranolol)  clonidine  guanethidine  reserpine This list may not describe all possible interactions. Give your health care provider a list of all the medicines, herbs, non-prescription drugs, or dietary supplements you use. Also tell them if you smoke, drink alcohol, or use illegal drugs. Some items may interact with your medicine. What should I watch for while using this medicine? Visit your health care provider for regular checks on your progress. Check with your health care provider if you have severe diarrhea, nausea, and vomiting, or if you sweat a lot. The loss of too much body fluid may make it dangerous for you to take this medicine. A test called the HbA1C (A1C) will be monitored. This is a simple blood test. It measures your blood sugar control over the last 2 to 3 months. You will receive this test every 3 to 6 months. Learn how to check your blood sugar. Learn the symptoms of low and high blood sugar and how to manage them. Always carry a quick-source of sugar with you in case you have symptoms of low blood sugar. Examples include hard sugar candy or glucose tablets. Make sure others know that you can choke if you eat or drink when you develop serious symptoms of low blood sugar, such as seizures or unconsciousness. Get medical help at once. Tell your health care provider if you have high blood sugar. You might need to change the dose of your medicine. If you are sick or exercising more than usual, you may need to change the dose of your medicine. Do not skip meals. Ask your health care provider if you should avoid alcohol. Many nonprescription cough and cold products contain sugar or alcohol. These can affect blood sugar. Pens should never be shared. Even if the needle is changed, sharing may result in passing of viruses like hepatitis or HIV. Wear a medical ID  bracelet or chain. Carry a card that describes your condition. List the medicines and doses you take on the card. What side effects may I notice from receiving this medicine? Side effects that you should report to your doctor or health care professional as soon as possible:  allergic reactions (skin rash, itching or hives; swelling of the face, lips, or tongue)  changes in vision  diarrhea that continues or is severe  infection (fever, chills, cough, sore throat, pain or trouble passing urine)  kidney injury (trouble passing urine or change in the amount of urine)  low blood sugar (feeling anxious; confusion; dizziness; increased hunger; unusually weak or tired; increased sweating; shakiness; cold, clammy skin; irritable; headache; blurred vision; fast heartbeat; loss of consciousness)  lump or swelling on the neck  trouble breathing  trouble swallowing  unusual stomach upset or pain  vomiting Side effects that usually do not require medical attention (report to your doctor or health care professional if they continue or are bothersome):  lack or loss of appetite  nausea  pain, redness, or irritation at site where injected This list may not describe all possible side effects. Call your doctor for medical advice about side effects. You may report  side effects to FDA at 1-800-FDA-1088. Where should I keep my medicine? Keep out of the reach of children and pets. Refrigeration (preferred): Store unopened pens in a refrigerator between 2 and 8 degrees C (36 and 46 degrees F). Keep it in the original carton until you are ready to take it. Do not freeze or use if the medicine has been frozen. Protect from light. Get rid of any unused medicine after the expiration date on the label. Room Temperature: The pen may be stored at room temperature below 30 degrees C (86 degrees F) for up to a total of 14 days if needed. Protect from light. Avoid exposure to extreme heat. If it is stored at room  temperature, throw away any unused medicine after 14 days or after it expires, whichever is first. To get rid of medicines that are no longer needed or have expired:  Take the medicine to a medicine take-back program. Check with your pharmacy or law enforcement to find a location.  If you cannot return the medicine, ask your pharmacist or health care provider how to get rid of this medicine safely. NOTE: This sheet is a summary. It may not cover all possible information. If you have questions about this medicine, talk to your doctor, pharmacist, or health care provider.  2021 [employer] Standard (2020-01-08 07:35:51)

## 2020-06-18 LAB — CMP14+EGFR
ALT: 22 IU/L (ref 0–32)
AST: 20 IU/L (ref 0–40)
Albumin/Globulin Ratio: 1.2 (ref 1.2–2.2)
Albumin: 4.1 g/dL (ref 3.8–4.8)
Alkaline Phosphatase: 385 IU/L — ABNORMAL HIGH (ref 44–121)
BUN/Creatinine Ratio: 25 (ref 12–28)
BUN: 27 mg/dL (ref 8–27)
Bilirubin Total: 0.3 mg/dL (ref 0.0–1.2)
CO2: 21 mmol/L (ref 20–29)
Calcium: 10.4 mg/dL — ABNORMAL HIGH (ref 8.7–10.3)
Chloride: 104 mmol/L (ref 96–106)
Creatinine, Ser: 1.07 mg/dL — ABNORMAL HIGH (ref 0.57–1.00)
Globulin, Total: 3.5 g/dL (ref 1.5–4.5)
Glucose: 117 mg/dL — ABNORMAL HIGH (ref 65–99)
Potassium: 4.4 mmol/L (ref 3.5–5.2)
Sodium: 143 mmol/L (ref 134–144)
Total Protein: 7.6 g/dL (ref 6.0–8.5)
eGFR: 58 mL/min/{1.73_m2} — ABNORMAL LOW (ref >59)

## 2020-06-18 LAB — LIPID PANEL
Chol/HDL Ratio: 4.8 ratio — ABNORMAL HIGH (ref 0.0–4.4)
Cholesterol, Total: 212 mg/dL — ABNORMAL HIGH (ref 100–199)
HDL: 44 mg/dL (ref >39)
LDL Chol Calc (NIH): 120 mg/dL — ABNORMAL HIGH (ref 0–99)
Triglycerides: 277 mg/dL — ABNORMAL HIGH (ref 0–149)
VLDL Cholesterol Cal: 48 mg/dL — ABNORMAL HIGH (ref 5–40)

## 2020-06-18 LAB — BRAIN NATRIURETIC PEPTIDE: BNP: 101.6 pg/mL — ABNORMAL HIGH (ref 0.0–100.0)

## 2020-06-18 LAB — MICROALBUMIN / CREATININE URINE RATIO
Creatinine, Urine: 84.8 mg/dL
Microalb/Creat Ratio: 25 mg/g creat (ref 0–29)
Microalbumin, Urine: 21.4 ug/mL

## 2020-06-19 ENCOUNTER — Other Ambulatory Visit: Payer: Self-pay

## 2020-06-19 ENCOUNTER — Ambulatory Visit (HOSPITAL_COMMUNITY)
Admission: RE | Admit: 2020-06-19 | Discharge: 2020-06-19 | Disposition: A | Discharge status: Home / Self Care | Payer: Medicare Other | Source: Ambulatory Visit | Attending: Family Medicine | Admitting: Family Medicine

## 2020-06-19 DIAGNOSIS — I10 Essential (primary) hypertension: Secondary | ICD-10-CM | POA: Diagnosis not present

## 2020-06-19 DIAGNOSIS — R0609 Other forms of dyspnea: Secondary | ICD-10-CM | POA: Insufficient documentation

## 2020-06-19 DIAGNOSIS — R221 Localized swelling, mass and lump, neck: Secondary | ICD-10-CM | POA: Insufficient documentation

## 2020-06-19 DIAGNOSIS — R0602 Shortness of breath: Secondary | ICD-10-CM | POA: Diagnosis not present

## 2020-06-19 DIAGNOSIS — I890 Lymphedema, not elsewhere classified: Secondary | ICD-10-CM | POA: Insufficient documentation

## 2020-06-19 DIAGNOSIS — E785 Hyperlipidemia, unspecified: Secondary | ICD-10-CM | POA: Diagnosis not present

## 2020-06-19 DIAGNOSIS — I700 Atherosclerosis of aorta: Secondary | ICD-10-CM | POA: Insufficient documentation

## 2020-06-19 NOTE — Progress Notes (Signed)
Carotid US complete    Please see CV Proc for preliminary results.   Vonzell Schlatter, RVT

## 2020-06-21 ENCOUNTER — Telehealth: Payer: Self-pay

## 2020-06-21 ENCOUNTER — Telehealth: Payer: Self-pay | Admitting: Family Medicine

## 2020-06-21 NOTE — Telephone Encounter (Signed)
Copied from [place] [id number]. Topic: General - Other >> Jun 19, 2020  3:13 PM Wynetta Emery, [place] C wrote: Reason for CRM: pt called to make provider aware that she was unable to have the echocardiogram completed today with the rest of her images that were ordered. Pt says that the location didn't go into details as to why. Pt would like to be advised/discuss further.    Please assist

## 2020-06-21 NOTE — Telephone Encounter (Signed)
Patient name and DOB has been verified Patient was informed of lab results. Patient had no questions.  

## 2020-06-21 NOTE — Telephone Encounter (Signed)
Pt has completed all test and has been informed of results.

## 2020-06-21 NOTE — Telephone Encounter (Signed)
-----   Message from Charlott Rakes, MD sent at 06/18/2020  6:09 PM EDT ----- Cholesterol is elevated compared to last set of labs. Please advise to work on low cholesterol diet, exercise and weight loss. We will consider changing her medication at next visit if still elevated. Kidney function is normal. Her liver enzyme is elevated which can be from her immonosuppressive medications and she needs to speak with her transplant surgeon clinic about this.

## 2020-06-21 NOTE — Telephone Encounter (Signed)
-----   Message from Charlott Rakes, MD sent at 06/20/2020  1:05 PM EDT ----- These inform her that her heart size is normal, there is no evidence of lung disease.  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

## 2020-06-21 NOTE — Telephone Encounter (Signed)
-----   Message from Charlott Rakes, MD sent at 06/20/2020  1:07 PM EDT ----- Please inform her that carotid Dopplers do not reveal evidence of any blockage.

## 2020-07-01 ENCOUNTER — Other Ambulatory Visit: Payer: Self-pay

## 2020-07-01 DIAGNOSIS — I890 Lymphedema, not elsewhere classified: Secondary | ICD-10-CM

## 2020-07-02 DIAGNOSIS — G4733 Obstructive sleep apnea (adult) (pediatric): Secondary | ICD-10-CM | POA: Diagnosis not present

## 2020-07-02 DIAGNOSIS — Z9989 Dependence on other enabling machines and devices: Secondary | ICD-10-CM | POA: Diagnosis not present

## 2020-07-02 DIAGNOSIS — J45909 Unspecified asthma, uncomplicated: Secondary | ICD-10-CM | POA: Diagnosis not present

## 2020-07-02 DIAGNOSIS — Z792 Long term (current) use of antibiotics: Secondary | ICD-10-CM | POA: Diagnosis not present

## 2020-07-02 DIAGNOSIS — I10 Essential (primary) hypertension: Secondary | ICD-10-CM | POA: Diagnosis not present

## 2020-07-02 DIAGNOSIS — E785 Hyperlipidemia, unspecified: Secondary | ICD-10-CM | POA: Diagnosis not present

## 2020-07-02 DIAGNOSIS — E119 Type 2 diabetes mellitus without complications: Secondary | ICD-10-CM | POA: Diagnosis not present

## 2020-07-02 DIAGNOSIS — R609 Edema, unspecified: Secondary | ICD-10-CM | POA: Diagnosis not present

## 2020-07-02 DIAGNOSIS — D696 Thrombocytopenia, unspecified: Secondary | ICD-10-CM | POA: Diagnosis not present

## 2020-07-02 DIAGNOSIS — Z7952 Long term (current) use of systemic steroids: Secondary | ICD-10-CM | POA: Diagnosis not present

## 2020-07-02 DIAGNOSIS — Z4822 Encounter for aftercare following kidney transplant: Secondary | ICD-10-CM | POA: Diagnosis not present

## 2020-07-02 DIAGNOSIS — D849 Immunodeficiency, unspecified: Secondary | ICD-10-CM | POA: Diagnosis not present

## 2020-07-02 DIAGNOSIS — Z940 Kidney transplant status: Secondary | ICD-10-CM | POA: Diagnosis not present

## 2020-07-02 DIAGNOSIS — Z79899 Other long term (current) drug therapy: Secondary | ICD-10-CM | POA: Diagnosis not present

## 2020-07-02 DIAGNOSIS — R0600 Dyspnea, unspecified: Secondary | ICD-10-CM | POA: Diagnosis not present

## 2020-07-02 DIAGNOSIS — E875 Hyperkalemia: Secondary | ICD-10-CM | POA: Diagnosis not present

## 2020-07-06 ENCOUNTER — Other Ambulatory Visit: Payer: Self-pay | Admitting: Family Medicine

## 2020-07-06 NOTE — Telephone Encounter (Signed)
Requested Prescriptions  Pending Prescriptions Disp Refills  . fluticasone (FLONASE) 50 MCG/ACT nasal spray [Pharmacy Med Name: FLUTICASONE PROP 50 MCG SPRAY] 48 mL 1    Sig: SPRAY 2 SPRAYS INTO EACH NOSTRIL EVERY DAY     Ear, Nose, and Throat: Nasal Preparations - Corticosteroids Passed - 07/06/2020  9:23 AM      Passed - Valid encounter within last 12 months    Recent Outpatient Visits          2 weeks ago Type 2 diabetes mellitus with stage 3a chronic kidney disease, without long-term current use of insulin ([place])   [place], [place], MD   6 months ago Type 2 diabetes mellitus with chronic kidney disease on chronic dialysis, without long-term current use of insulin ([place])   [facility] [place], [place], MD   1 year ago Type 2 diabetes mellitus with chronic kidney disease on chronic dialysis, without long-term current use of insulin ([place])   [place], Charlane Ferretti, MD   1 year ago End stage renal disease ([place])   [place], Charlane Ferretti, MD   2 years ago Type 2 diabetes mellitus with other specified complication, without long-term current use of insulin ([place])   [place], [place], MD      Future Appointments            In 3 weeks Charlott Rakes, MD [place]

## 2020-07-16 DIAGNOSIS — I422 Other hypertrophic cardiomyopathy: Secondary | ICD-10-CM | POA: Diagnosis not present

## 2020-07-16 DIAGNOSIS — I083 Combined rheumatic disorders of mitral, aortic and tricuspid valves: Secondary | ICD-10-CM | POA: Diagnosis not present

## 2020-07-16 DIAGNOSIS — Z940 Kidney transplant status: Secondary | ICD-10-CM | POA: Diagnosis not present

## 2020-07-16 DIAGNOSIS — I313 Pericardial effusion (noninflammatory): Secondary | ICD-10-CM | POA: Diagnosis not present

## 2020-07-16 DIAGNOSIS — R0600 Dyspnea, unspecified: Secondary | ICD-10-CM | POA: Diagnosis not present

## 2020-07-16 DIAGNOSIS — I080 Rheumatic disorders of both mitral and aortic valves: Secondary | ICD-10-CM | POA: Diagnosis not present

## 2020-07-16 HISTORY — PX: TRANSTHORACIC ECHOCARDIOGRAM: SHX275

## 2020-07-23 ENCOUNTER — Ambulatory Visit (INDEPENDENT_AMBULATORY_CARE_PROVIDER_SITE_OTHER): Payer: Medicare Other | Admitting: Vascular Surgery

## 2020-07-23 ENCOUNTER — Ambulatory Visit (HOSPITAL_COMMUNITY)
Admission: RE | Admit: 2020-07-23 | Discharge: 2020-07-23 | Disposition: A | Discharge status: Home / Self Care | Payer: Medicare Other | Source: Ambulatory Visit | Attending: Vascular Surgery | Admitting: Vascular Surgery

## 2020-07-23 ENCOUNTER — Other Ambulatory Visit: Payer: Self-pay

## 2020-07-23 ENCOUNTER — Encounter: Payer: Self-pay | Admitting: Vascular Surgery

## 2020-07-23 DIAGNOSIS — I890 Lymphedema, not elsewhere classified: Secondary | ICD-10-CM | POA: Insufficient documentation

## 2020-07-23 DIAGNOSIS — N186 End stage renal disease: Secondary | ICD-10-CM | POA: Insufficient documentation

## 2020-07-23 NOTE — Progress Notes (Signed)
Patient name: Rachael George MRN: [id number] DOB: 04/19/1955 Sex: female  REASON FOR VISIT: Evaluate worsening left arm and neck swelling  HPI: Rachael George is a 65 y.o. female with multiple medical comorbidities including ESRD now status post kidney transplant last year at [facility] that presents for evaluation of worsening left arm and neck swelling.  She states this has been worsening since November of last year.  She has a left upper arm loop graft that she was using for dialysis prior to her kidney transplant (and has many failed access procedures in both upper extremities prior to loop graft).  She states they are not currently using the graft at this time given that her kidney transplant is functioning.  She states her main complaint is just some swelling in the arm but it is manageable at this point and not painful.    Past Medical History:  Diagnosis Date  . Anemia   . Asthma   . ESRD (end stage renal disease) ([place])    MWF  . GERD (gastroesophageal reflux disease)   . Gout   . 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 Heavy menstrual bleeding   . High cholesterol   . History of blood transfusion 02/2016- last one   1990's- several ones   . History of hiatal hernia   . History of kidney stones 1980's  . Hyperlipemia 12/07/2012  . Hypertension   . Hypothyroidism   . Multiple gastric ulcers   . Pneumonia 2012  . Pre-diabetes   . Prediabetes   . RA (rheumatoid arthritis) ([place])   . Sleep apnea    [place]- have CPAP, not able to use every night     Past Surgical History:  Procedure Laterality Date  . A/V SHUNTOGRAM N/A 09/08/2016   Procedure: A/V Shuntogram - Left Arm AV Graft;  Surgeon: Serafina Mitchell, MD;  Location: [place] [facility];  Service: Cardiovascular;  Laterality: N/A;  . A/V SHUNTOGRAM N/A 09/02/2017   Procedure: A/V SHUNTOGRAM - Left Arm AVG;  Surgeon: Conrad Lenapah, MD;  Location: [place] [facility];  Service: Cardiovascular;   Laterality: N/A;  . A/V SHUNTOGRAM N/A 12/21/2017   Procedure: A/V SHUNTOGRAM - left arm;  Surgeon: Serafina Mitchell, MD;  Location: [place] [facility];  Service: Cardiovascular;  Laterality: N/A;  . A/V SHUNTOGRAM Left 10/05/2018   Procedure: A/V SHUNTOGRAM;  Surgeon: Marty Heck, MD;  Location: [place] [facility];  Service: Cardiovascular;  Laterality: Left;  . ABDOMINAL HYSTERECTOMY  2000  . AV FISTULA PLACEMENT Left 05/25/2016   Procedure: INSERTION OF LEFT UPPER ARM ARTERIOVENOUS (AV) LOOP GORE-TEX GRAFT ARM;  Surgeon: Elam Dutch, MD;  Location: [place];  Service: Vascular;  Laterality: Left;  . BASCILIC VEIN TRANSPOSITION Right 12/06/2014   Procedure: FIRST STAGE BASILIC VEIN TRANSPOSITION - RIGHT;  Surgeon: Serafina Mitchell, MD;  Location: [place];  Service: Vascular;  Laterality: Right;  . BASCILIC VEIN TRANSPOSITION Right 02/07/2015   Procedure: RIGHT ARM SECOND STAGE BASILIC VEIN TRANSPOSITION;  Surgeon: Serafina Mitchell, MD;  Location: [place];  Service: Vascular;  Laterality: Right;  . BASCILIC VEIN TRANSPOSITION Left 04/22/2016   Procedure: FIRST STAGE BASILIC VEIN TRANSPOSITION LEFT ARM;  Surgeon: Conrad Meridian, MD;  Location: [place];  Service: Vascular;  Laterality: Left;  . COLONOSCOPY W/ POLYPECTOMY    . ECTOPIC PREGNANCY SURGERY  02/1982  . EXCHANGE OF A DIALYSIS CATHETER  Right 04/22/2016   Procedure: EXCHANGE OF A DIALYSIS CATHETER - INSERTION RIGHT INTERNAL JUGULAR & REMOVAL FROM LEFT INTERNAL JUGULAR;  Surgeon: Conrad Toccopola, MD;  Location: [place];  Service: Vascular;  Laterality: Right;  . I & D EXTREMITY Right 02/07/2015   Procedure: IRRIGATION AND DEBRIDEMENT RIGHT ARM HEMATOMA;  Surgeon: Serafina Mitchell, MD;  Location: [place];  Service: Vascular;  Laterality: Right;  . INSERTION OF DIALYSIS CATHETER  03/03/2016   Procedure: INSERTION OF DIALYSIS CATHETER;  Surgeon: Elam Dutch, MD;  Location: [place];  Service: Vascular;;  . IR AV DIALY SHUNT INTRO NEEDLE/INTRACATH  INITIAL W/PTA/IMG LEFT  04/20/2017  . IR AV DIALY SHUNT INTRO NEEDLE/INTRACATH INITIAL W/PTA/IMG LEFT  06/24/2017  . IR THROMBECTOMY AV FISTULA W/THROMBOLYSIS/PTA INC/SHUNT/IMG LEFT Left 01/04/2017  . IR US GUIDE VASC ACCESS LEFT  01/04/2017  . left shoulder surgery  08/2005  . LIGATION OF ARTERIOVENOUS  FISTULA  03/03/2016   Procedure: LIGATION OF ARTERIOVENOUS  FISTULA;  Surgeon: Elam Dutch, MD;  Location: [facility] OR;  Service: Vascular;;  . LIGATION OF ARTERIOVENOUS  FISTULA Right 09/06/2018   Procedure: Resection of pseudoaneursym and end to end repair of right brachial artery;  Surgeon: Elam Dutch, MD;  Location: [facility] OR;  Service: Vascular;  Laterality: Right;  . PERIPHERAL VASCULAR BALLOON ANGIOPLASTY  09/08/2016   Procedure: Peripheral Vascular Balloon Angioplasty;  Surgeon: Serafina Mitchell, MD;  Location: [place] [facility];  Service: Cardiovascular;;  left avf  . PERIPHERAL VASCULAR BALLOON ANGIOPLASTY  09/02/2017   Procedure: PERIPHERAL VASCULAR BALLOON ANGIOPLASTY;  Surgeon: Conrad Wilderness Rim, MD;  Location: [place] [facility];  Service: Cardiovascular;;  left AV Graft  . PERIPHERAL VASCULAR BALLOON ANGIOPLASTY  12/21/2017   Procedure: PERIPHERAL VASCULAR BALLOON ANGIOPLASTY;  Surgeon: Serafina Mitchell, MD;  Location: [place] [facility];  Service: Cardiovascular;;  INOMINATE / UPPER ARM AV GRAFT  . PERIPHERAL VASCULAR BALLOON ANGIOPLASTY Left 05/12/2018   Procedure: PERIPHERAL VASCULAR BALLOON ANGIOPLASTY;  Surgeon: Marty Heck, MD;  Location: [place] [facility];  Service: Cardiovascular;  Laterality: Left;  Arm fistula  . PERIPHERAL VASCULAR BALLOON ANGIOPLASTY Left 10/05/2018   Procedure: PERIPHERAL VASCULAR BALLOON ANGIOPLASTY;  Surgeon: Marty Heck, MD;  Location: [place] [facility];  Service: Cardiovascular;  Laterality: Left;  central and peripheral vein  . PERIPHERAL VASCULAR BALLOON ANGIOPLASTY Left 04/18/2019   Procedure: PERIPHERAL VASCULAR BALLOON ANGIOPLASTY;   Surgeon: Serafina Mitchell, MD;  Location: [place] [facility];  Service: Cardiovascular;  Laterality: Left;  arm fistula   . TEE WITHOUT CARDIOVERSION N/A 03/06/2016   Procedure: TRANSESOPHAGEAL ECHOCARDIOGRAM (TEE);  Surgeon: Sueanne Margarita, MD;  Location: [facility] ENDOSCOPY;  Service: Cardiovascular;  Laterality: N/A;  . TUBAL LIGATION  11/1986    Family History  Problem Relation Age of Onset  . Hypertension Mother   . Breast cancer Maternal Aunt   . Deep vein thrombosis Daughter   . Hyperlipidemia Daughter   . Hypertension Daughter   . Prostate cancer Maternal Grandmother     SOCIAL HISTORY: Social History   Tobacco Use  . Smoking status: Never Smoker  . Smokeless tobacco: Never Used  Substance Use Topics  . Alcohol use: Yes    Alcohol/week: 0.0 standard drinks    Comment: occ - 1 drink rarely    Allergies  Allergen Reactions  . Atacand [Candesartan] Anaphylaxis and Other (See Comments)    Swelling of the mouth and tongue.  Marland Kitchen Lisinopril Anaphylaxis and Other (  See Comments)    Swelling of the mouth and tongue.  . Nsaids Anaphylaxis and Other (See Comments)    Swelling of the mouth and tongue.  Marland Kitchen Penicillins Rash and Other (See Comments)    Has patient had a PCN reaction causing immediate rash, facial/tongue/throat swelling, SOB or lightheadedness with hypotension: No Has patient had a PCN reaction causing severe rash involving mucus membranes or skin necrosis: No Has patient had a PCN reaction that required hospitalization No Has patient had a PCN reaction occurring within the last 10 years: No If all of the above answers are "NO", then may proceed with Cephalosporin use.     Current Outpatient Medications  Medication Sig Dispense Refill  . Accu-Chek Softclix Lancets lancets Use as instructed to check blood sugar once daily. E11.22, N18.6, Z99.2 100 each 8  . acetaminophen (TYLENOL) 650 MG CR tablet Take 650-1,300 mg by mouth every 8 (eight) hours as needed for pain.    Marland Kitchen  albuterol (PROVENTIL) (2.5 MG/3ML) 0.083% nebulizer solution Take 3 mLs (2.5 mg total) by nebulization every 6 (six) hours as needed for wheezing or shortness of breath. ICD 10:J45.20 75 mL 0  . albuterol (VENTOLIN HFA) 108 (90 Base) MCG/ACT inhaler INHALE 2 PUFFS BY MOUTH EVERY 6 HOURS AS NEEDED FOR WHEEZE OR SHORTNESS OF BREATH 54 each 1  . amLODipine (NORVASC) 10 MG tablet TAKE 1 TABLET BY MOUTH EVERYDAY AT BEDTIME 30 tablet 0  . aspirin 81 MG EC tablet Take 1 tablet by mouth daily.    Marland Kitchen atorvastatin (LIPITOR) 80 MG tablet Take 1 tablet (80 mg total) by mouth daily. 30 tablet 6  . Blood Glucose Monitoring Suppl (ACCU-CHEK GUIDE) w/Device KIT 1 each by Does not apply route daily. Use as instructed to check blood sugar once daily. E11.22, N18.6, Z99.2 1 kit 0  . carvedilol (COREG) 25 MG tablet Take 1 tablet (25 mg total) by mouth 2 (two) times daily with a meal. 60 tablet 6  . chlorpheniramine (CHLOR-TRIMETON) 4 MG tablet Take 1 tablet (4 mg total) by mouth 3 (three) times daily. (Patient taking differently: Take 8 mg by mouth daily at 12 noon.) 190 tablet 0  . Cholecalciferol 25 MCG (1000 UT) capsule Take by mouth.    . cinacalcet (SENSIPAR) 30 MG tablet Take 1 tablet by mouth daily.    . Dulaglutide (TRULICITY) 5.46 EV/0.3JK SOPN Inject 0.75 mg into the skin once a week. 2 mL 6  . fluticasone (FLONASE) 50 MCG/ACT nasal spray SPRAY 2 SPRAYS INTO EACH NOSTRIL EVERY DAY 48 mL 1  . glucose blood (ACCU-CHEK GUIDE) test strip Use as instructed to check blood sugar once daily. E11.22, N18.6, Z99.2 30 each 8  . hydrochlorothiazide (MICROZIDE) 12.5 MG capsule TAKE 1 CAPSULE BY MOUTH EVERY MONDAY, WEDNESDAY, FRIDAY.    . magnesium oxide (MAG-OX) 400 MG tablet Take 2 tablets by mouth 2 (two) times daily.    . multivitamin (RENA-VIT) TABS tablet Take 1 tablet by mouth daily with lunch.     . mycophenolate (MYFORTIC) 180 MG EC tablet Take by mouth.    . mycophenolate (MYFORTIC) 180 MG EC tablet Take 360 mg by  mouth 2 (two) times daily.    . pantoprazole (PROTONIX) 40 MG tablet Take 1 tablet (40 mg total) by mouth 2 (two) times daily. 30 tablet 6  . predniSONE (DELTASONE) 5 MG tablet Take 1 tablet by mouth daily.    Marland Kitchen senna-docusate (SENOKOT-S) 8.6-50 MG tablet Take 1 tablet by mouth 2 (two)  times daily.    . budesonide-formoterol (SYMBICORT) 160-4.5 MCG/ACT inhaler Inhale 2 puffs into the lungs 2 (two) times daily for 1 day. 3 each 1  . clotrimazole (LOTRIMIN) 1 % cream Apply 1 application topically 2 (two) times daily. (Patient not taking: Reported on 07/23/2020) 30 g 1  . cyclobenzaprine (FLEXERIL) 10 MG tablet Take 1 tablet (10 mg total) by mouth 2 (two) times daily as needed. For back pain (Patient not taking: Reported on 07/23/2020) 60 tablet 2  . meclizine (ANTIVERT) 25 MG tablet Take 25 mg by mouth 3 (three) times daily as needed for dizziness. (Patient not taking: No sig reported)    . phosphorus (K PHOS NEUTRAL) 155-852-130 MG tablet TAKE 2 TABLETS BY MOUTH IN THE MORNING AND AT BEDTIME. (Patient not taking: Reported on 07/23/2020)    . sulfamethoxazole-trimethoprim (BACTRIM) 400-80 MG tablet TAKE 1 TABLET BY MOUTH EVERY MONDAY, WEDNESDAY, FRIDAY. (Patient not taking: No sig reported)    . Tacrolimus ER 1 MG TB24 Take by mouth. (Patient not taking: Reported on 07/23/2020)    . valGANciclovir (VALCYTE) 450 MG tablet Take by mouth. (Patient not taking: Reported on 07/23/2020)    . VELPHORO 500 MG chewable tablet Chew 500-1,000 mg by mouth See admin instructions. Take 2 tablets (1000 mg) by mouth with each meals & take 1 tablet (500 mg) by mouth with each snack (Patient not taking: Reported on 07/23/2020)     No current facility-administered medications for this visit.    REVIEW OF SYSTEMS:  [X]  denotes positive finding, [ ]  denotes negative finding Cardiac  Comments:  Chest pain or chest pressure:    Shortness of breath upon exertion:    Short of breath when lying flat:    Irregular heart rhythm:         Vascular    Pain in calf, thigh, or hip brought on by ambulation:    Pain in feet at night that wakes you up from your sleep:     Blood clot in your veins:    Leg swelling:     Arm Swelling x Left  Pulmonary    Oxygen at home:    Productive cough:     Wheezing:         Neurologic    Sudden weakness in arms or legs:     Sudden numbness in arms or legs:     Sudden onset of difficulty speaking or slurred speech:    Temporary loss of vision in one eye:     Problems with dizziness:         Gastrointestinal    Blood in stool:     Vomited blood:         Genitourinary    Burning when urinating:     Blood in urine:        Psychiatric    Major depression:         Hematologic    Bleeding problems:    Problems with blood clotting too easily:        Skin    Rashes or ulcers:        Constitutional    Fever or chills:      PHYSICAL EXAM: Vitals:   07/23/20 1146  BP: (!) 145/87  Pulse: 86  Resp: 16  Temp: (!) 97.2 F (36.2 C)  TempSrc: Temporal  SpO2: 97%  Weight: 199 lb (90.3 kg)  Height: 5' 2"  (1.575 m)    GENERAL: The patient is a well-nourished female, in  no acute distress. The vital signs are documented above. CARDIAC: There is a regular rate and rhythm.  VASCULAR:  Left upper arm loop graft with appreciable thrill No overlying ulcerations over left arm graft Left arm swelling mild compared to right PULMONARY: No respiratory distress. ABDOMEN: Soft and non-tender. MUSCULOSKELETAL: There are no major deformities or cyanosis. NEUROLOGIC: No focal weakness or paresthesias are detected. SKIN: There are no ulcers or rashes noted. PSYCHIATRIC: The patient has a normal affect.  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  Discussed with her in detail that her left arm swelling is likely due to the venous anastomotic stenosis identified on duplex and also an additional central stenosis that has previously required intervention.  I discussed with her options of a left upper extremity fistulogram to intervene on the stenosis, but I worry about risk of contrast causing damage to her functioning kidney transplant.  We discussed other options would be ligation of her left upper arm AV graft.  Ultimately she feels that her symptoms are tolerable and realizes that she has very limited options in the future if her kidney transplant fails.  She is going to monitor symptoms and will let us know if things get worse.   Marty Heck, MD [facility] Office: [phone]

## 2020-07-30 DIAGNOSIS — D696 Thrombocytopenia, unspecified: Secondary | ICD-10-CM | POA: Diagnosis not present

## 2020-07-30 DIAGNOSIS — E875 Hyperkalemia: Secondary | ICD-10-CM | POA: Diagnosis not present

## 2020-07-30 DIAGNOSIS — Z5181 Encounter for therapeutic drug level monitoring: Secondary | ICD-10-CM | POA: Diagnosis not present

## 2020-07-30 DIAGNOSIS — Z9989 Dependence on other enabling machines and devices: Secondary | ICD-10-CM | POA: Diagnosis not present

## 2020-07-30 DIAGNOSIS — D849 Immunodeficiency, unspecified: Secondary | ICD-10-CM | POA: Diagnosis not present

## 2020-07-30 DIAGNOSIS — Z4822 Encounter for aftercare following kidney transplant: Secondary | ICD-10-CM | POA: Diagnosis not present

## 2020-07-30 DIAGNOSIS — Z79899 Other long term (current) drug therapy: Secondary | ICD-10-CM | POA: Diagnosis not present

## 2020-07-30 DIAGNOSIS — J45909 Unspecified asthma, uncomplicated: Secondary | ICD-10-CM | POA: Diagnosis not present

## 2020-07-30 DIAGNOSIS — E785 Hyperlipidemia, unspecified: Secondary | ICD-10-CM | POA: Diagnosis not present

## 2020-07-30 DIAGNOSIS — R600 Localized edema: Secondary | ICD-10-CM | POA: Diagnosis not present

## 2020-07-30 DIAGNOSIS — I10 Essential (primary) hypertension: Secondary | ICD-10-CM | POA: Diagnosis not present

## 2020-07-30 DIAGNOSIS — N2889 Other specified disorders of kidney and ureter: Secondary | ICD-10-CM | POA: Diagnosis not present

## 2020-07-30 DIAGNOSIS — I151 Hypertension secondary to other renal disorders: Secondary | ICD-10-CM | POA: Diagnosis not present

## 2020-07-30 DIAGNOSIS — Z7952 Long term (current) use of systemic steroids: Secondary | ICD-10-CM | POA: Diagnosis not present

## 2020-07-30 DIAGNOSIS — E119 Type 2 diabetes mellitus without complications: Secondary | ICD-10-CM | POA: Diagnosis not present

## 2020-07-30 DIAGNOSIS — Z940 Kidney transplant status: Secondary | ICD-10-CM | POA: Diagnosis not present

## 2020-07-30 DIAGNOSIS — G4733 Obstructive sleep apnea (adult) (pediatric): Secondary | ICD-10-CM | POA: Diagnosis not present

## 2020-07-30 DIAGNOSIS — R0609 Other forms of dyspnea: Secondary | ICD-10-CM | POA: Diagnosis not present

## 2020-07-30 DIAGNOSIS — Z792 Long term (current) use of antibiotics: Secondary | ICD-10-CM | POA: Diagnosis not present

## 2020-07-31 ENCOUNTER — Other Ambulatory Visit: Payer: Self-pay

## 2020-07-31 ENCOUNTER — Encounter: Payer: Self-pay | Admitting: Family Medicine

## 2020-07-31 ENCOUNTER — Ambulatory Visit: Payer: Medicare Other | Attending: Family Medicine | Admitting: Family Medicine

## 2020-07-31 VITALS — BP 130/77 | HR 86 | Ht 62.0 in | Wt 200.2 lb

## 2020-07-31 DIAGNOSIS — G8929 Other chronic pain: Secondary | ICD-10-CM

## 2020-07-31 DIAGNOSIS — M545 Low back pain, unspecified: Secondary | ICD-10-CM

## 2020-07-31 DIAGNOSIS — R221 Localized swelling, mass and lump, neck: Secondary | ICD-10-CM

## 2020-07-31 DIAGNOSIS — L84 Corns and callosities: Secondary | ICD-10-CM | POA: Diagnosis not present

## 2020-07-31 DIAGNOSIS — E1169 Type 2 diabetes mellitus with other specified complication: Secondary | ICD-10-CM

## 2020-07-31 DIAGNOSIS — E785 Hyperlipidemia, unspecified: Secondary | ICD-10-CM

## 2020-07-31 MED ORDER — CYCLOBENZAPRINE HCL 10 MG PO TABS: 10.0000 mg | ORAL_TABLET | Freq: Two times a day (BID) | ORAL | 2 refills | Status: DC | PRN

## 2020-07-31 MED ORDER — ROSUVASTATIN CALCIUM 20 MG PO TABS: 20.0000 mg | ORAL_TABLET | Freq: Every day | ORAL | 6 refills | Status: DC

## 2020-07-31 NOTE — Patient Instructions (Signed)
Diabetes Mellitus and Foot Care Foot care is an important part of your health, especially when you have diabetes. Diabetes may cause you to have problems because of poor blood flow (circulation) to your feet and legs, which can cause your skin to:  Become thinner and drier.  Break more easily.  Heal more slowly.  Peel and crack. You may also have nerve damage (neuropathy) in your legs and feet, causing decreased feeling in them. This means that you may not notice minor injuries to your feet that could lead to more serious problems. Noticing and addressing any potential problems early is the best way to prevent future foot problems. How to care for your feet Foot hygiene  Wash your feet daily with warm water and mild soap. Do not use hot water. Then, pat your feet and the areas between your toes until they are completely dry. Do not soak your feet as this can dry your skin.  Trim your toenails straight across. Do not dig under them or around the cuticle. File the edges of your nails with an emery board or nail file.  Apply a moisturizing lotion or petroleum jelly to the skin on your feet and to dry, brittle toenails. Use lotion that does not contain alcohol and is unscented. Do not apply lotion between your toes.   Shoes and socks  Wear clean socks or stockings every day. Make sure they are not too tight. Do not wear knee-high stockings since they may decrease blood flow to your legs.  Wear shoes that fit properly and have enough cushioning. Always look in your shoes before you put them on to be sure there are no objects inside.  To break in new shoes, wear them for just a few hours a day. This prevents injuries on your feet. Wounds, scrapes, corns, and calluses  Check your feet daily for blisters, cuts, bruises, sores, and redness. If you cannot see the bottom of your feet, use a mirror or ask someone for help.  Do not cut corns or calluses or try to remove them with medicine.  If you  find a minor scrape, cut, or break in the skin on your feet, keep it and the skin around it clean and dry. You may clean these areas with mild soap and water. Do not clean the area with peroxide, alcohol, or iodine.  If you have a wound, scrape, corn, or callus on your foot, look at it several times a day to make sure it is healing and not infected. Check for: ? Redness, swelling, or pain. ? Fluid or blood. ? Warmth. ? Pus or a bad smell.   General tips  Do not cross your legs. This may decrease blood flow to your feet.  Do not use heating pads or hot water bottles on your feet. They may burn your skin. If you have lost feeling in your feet or legs, you may not know this is happening until it is too late.  Protect your feet from hot and cold by wearing shoes, such as at the beach or on hot pavement.  Schedule a complete foot exam at least once a year (annually) or more often if you have foot problems. Report any cuts, sores, or bruises to your health care provider immediately. Where to find more information  American Diabetes Association: www.diabetes.org  Association of Diabetes Care & Education Specialists: www.diabeteseducator.org Contact a health care provider if:  You have a medical condition that increases your risk of infection and   you have any cuts, sores, or bruises on your feet.  You have an injury that is not healing.  You have redness on your legs or feet.  You feel burning or tingling in your legs or feet.  You have pain or cramps in your legs and feet.  Your legs or feet are numb.  Your feet always feel cold.  You have pain around any toenails. Get help right away if:  You have a wound, scrape, corn, or callus on your foot and: ? You have pain, swelling, or redness that gets worse. ? You have fluid or blood coming from the wound, scrape, corn, or callus. ? Your wound, scrape, corn, or callus feels warm to the touch. ? You have pus or a bad smell coming from  the wound, scrape, corn, or callus. ? You have a fever. ? You have a red line going up your leg. Summary  Check your feet every day for blisters, cuts, bruises, sores, and redness.  Apply a moisturizing lotion or petroleum jelly to the skin on your feet and to dry, brittle toenails.  Wear shoes that fit properly and have enough cushioning.  If you have foot problems, report any cuts, sores, or bruises to your health care provider immediately.  Schedule a complete foot exam at least once a year (annually) or more often if you have foot problems. This information is not intended to replace advice given to you by your health care provider. Make sure you discuss any questions you have with your health care provider. Document Revised: 09/28/2019 Document Reviewed: 09/28/2019 Elsevier Patient Education  2021 [employer]  

## 2020-07-31 NOTE — Progress Notes (Signed)
Here for neck swelling. States it still the same. Refill on flexeril.

## 2020-07-31 NOTE — Progress Notes (Signed)
Subjective:  Patient ID: Rachael George, female    DOB: [birth year]  Age: 65 y.o. MRN: [id number]  CC: Follow-up   HPI Rachael George  is a 65yrold female with a past medical historyof hypertension, type 2 diabetes mellutlits(A1c6.7),Gout,[facility]RDstatus [facility], mycophenolate,Tacrolimus and Valganciclovir, Prednisone -notes from care everywhere reviewed.  Interval history She had complained of left sided neck swelling and left arm swelling for which had ordered a Carotid Doppler which was negative for carotid artery stenosis. 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  Through joint decision-making with patient and vascular surgeon option was made for watchful waiting. Needs refill on Flexeril as her back is hurting she has been sleeping in a recliner due to her back pain.  Pain does not radiate down her lower extremities and she has no recent falls or loss of sphincteric function.  Sugars are 92-108 and she is doing well on Trulicity which was initiated at her last visit in place of Januvia. She has no additional concerns today. Her last lipid level revealed uncontrolled LDL of 120 despite compliance with atorvastatin. Past Medical History:  Diagnosis Date  . Anemia   . Asthma   . ESRD (end stage renal disease) ([place]    MWF  . GERD (gastroesophageal reflux disease)   . Gout   . 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 Heavy menstrual bleeding   . High cholesterol   . History of blood transfusion 02/2016- last one   1990's- several ones   . History of hiatal hernia   . History of kidney stones 1980's  . Hyperlipemia 12/07/2012  . Hypertension   . Hypothyroidism   . Multiple gastric ulcers   . Pneumonia 2012  . Pre-diabetes   . Prediabetes    . RA (rheumatoid arthritis) ([place]   . Sleep apnea    [place]- have CPAP, not able to use every night     Past Surgical History:  Procedure Laterality Date  . A/V SHUNTOGRAM N/A 09/08/2016   Procedure: A/V Shuntogram - Left Arm AV Graft;  Surgeon: BSerafina Mitchell MD;  [place];  Service: Cardiovascular;  Laterality: N/A;  . A/V SHUNTOGRAM N/A 09/02/2017   Procedure: A/V SHUNTOGRAM - Left Arm AVG;  Surgeon: CConrad Eleele MD;  [place];  Service: Cardiovascular;  Laterality: N/A;  . A/V SHUNTOGRAM N/A 12/21/2017   Procedure: A/V SHUNTOGRAM - left arm;  Surgeon: BSerafina Mitchell MD;  [place] [facility];  Service: Cardiovascular;  Laterality: N/A;  . A/V SHUNTOGRAM Left 10/05/2018   Procedure: A/V SHUNTOGRAM;  Surgeon: CMarty Heck MD;  [place];  Service: Cardiovascular;  Laterality: Left;  . ABDOMINAL HYSTERECTOMY  2000  . AV FISTULA PLACEMENT Left 05/25/2016   Procedure: INSERTION OF LEFT UPPER ARM ARTERIOVENOUS (AV) LOOP GORE-TEX GRAFT ARM;  Surgeon: CElam Dutch MD;  [place]  Service: Vascular;  Laterality: Left;  . BASCILIC VEIN TRANSPOSITION Right 12/06/2014   Procedure: FIRST STAGE BASILIC VEIN TRANSPOSITION - RIGHT;  Surgeon: VSerafina Mitchell MD;  [place]  Service: Vascular;  Laterality: Right;  . BAltoRight 02/07/2015   Procedure: RIGHT ARM SECOND STAGE BASILIC VEIN TRANSPOSITION;  Surgeon:  Serafina Mitchell, MD;  Location: [facility] OR;  Service: Vascular;  Laterality: Right;  . BASCILIC VEIN TRANSPOSITION Left 04/22/2016   Procedure: FIRST STAGE BASILIC VEIN TRANSPOSITION LEFT ARM;  Surgeon: Conrad Cache, MD;  Location: [place];  Service: Vascular;  Laterality: Left;  . COLONOSCOPY W/ POLYPECTOMY    . ECTOPIC PREGNANCY SURGERY  02/1982  . EXCHANGE OF A DIALYSIS CATHETER Right 04/22/2016   Procedure: EXCHANGE OF A DIALYSIS CATHETER - INSERTION RIGHT INTERNAL JUGULAR &  REMOVAL FROM LEFT INTERNAL JUGULAR;  Surgeon: Conrad [place], MD;  Location: [place];  Service: Vascular;  Laterality: Right;  . I & D EXTREMITY Right 02/07/2015   Procedure: IRRIGATION AND DEBRIDEMENT RIGHT ARM HEMATOMA;  Surgeon: Serafina Mitchell, MD;  Location: [place];  Service: Vascular;  Laterality: Right;  . INSERTION OF DIALYSIS CATHETER  03/03/2016   Procedure: INSERTION OF DIALYSIS CATHETER;  Surgeon: Elam Dutch, MD;  Location: [place];  Service: Vascular;;  . IR AV DIALY SHUNT INTRO NEEDLE/INTRACATH INITIAL W/PTA/IMG LEFT  04/20/2017  . IR AV DIALY SHUNT INTRO NEEDLE/INTRACATH INITIAL W/PTA/IMG LEFT  06/24/2017  . IR THROMBECTOMY AV FISTULA W/THROMBOLYSIS/PTA INC/SHUNT/IMG LEFT Left 01/04/2017  . IR US GUIDE VASC ACCESS LEFT  01/04/2017  . left shoulder surgery  08/2005  . LIGATION OF ARTERIOVENOUS  FISTULA  03/03/2016   Procedure: LIGATION OF ARTERIOVENOUS  FISTULA;  Surgeon: Elam Dutch, MD;  Location: [facility] OR;  Service: Vascular;;  . LIGATION OF ARTERIOVENOUS  FISTULA Right 09/06/2018   Procedure: Resection of pseudoaneursym and end to end repair of right brachial artery;  Surgeon: Elam Dutch, MD;  Location: [facility] OR;  Service: Vascular;  Laterality: Right;  . PERIPHERAL VASCULAR BALLOON ANGIOPLASTY  09/08/2016   Procedure: Peripheral Vascular Balloon Angioplasty;  Surgeon: Serafina Mitchell, MD;  Location: [place] [facility];  Service: Cardiovascular;;  left avf  . PERIPHERAL VASCULAR BALLOON ANGIOPLASTY  09/02/2017   Procedure: PERIPHERAL VASCULAR BALLOON ANGIOPLASTY;  Surgeon: Conrad [place], MD;  Location: [place] [facility];  Service: Cardiovascular;;  left AV Graft  . PERIPHERAL VASCULAR BALLOON ANGIOPLASTY  12/21/2017   Procedure: PERIPHERAL VASCULAR BALLOON ANGIOPLASTY;  Surgeon: Serafina Mitchell, MD;  Location: [place] [facility];  Service: Cardiovascular;;  INOMINATE / UPPER ARM AV GRAFT  . PERIPHERAL VASCULAR BALLOON ANGIOPLASTY Left 05/12/2018   Procedure: PERIPHERAL VASCULAR  BALLOON ANGIOPLASTY;  Surgeon: Marty Heck, MD;  Location: [place] [facility];  Service: Cardiovascular;  Laterality: Left;  Arm fistula  . PERIPHERAL VASCULAR BALLOON ANGIOPLASTY Left 10/05/2018   Procedure: PERIPHERAL VASCULAR BALLOON ANGIOPLASTY;  Surgeon: Marty Heck, MD;  Location: [place] [facility];  Service: Cardiovascular;  Laterality: Left;  central and peripheral vein  . PERIPHERAL VASCULAR BALLOON ANGIOPLASTY Left 04/18/2019   Procedure: PERIPHERAL VASCULAR BALLOON ANGIOPLASTY;  Surgeon: Serafina Mitchell, MD;  Location: [place] [facility];  Service: Cardiovascular;  Laterality: Left;  arm fistula   . TEE WITHOUT CARDIOVERSION N/A 03/06/2016   Procedure: TRANSESOPHAGEAL ECHOCARDIOGRAM (TEE);  Surgeon: Sueanne Margarita, MD;  Location: [facility];  Service: Cardiovascular;  Laterality: N/A;  . TUBAL LIGATION  11/1986    Family History  Problem Relation Age of Onset  . Hypertension Mother   . Breast cancer Maternal Aunt   . Deep vein thrombosis Daughter   . Hyperlipidemia Daughter   . Hypertension Daughter   . Prostate cancer Maternal Grandmother     Allergies  Allergen Reactions  . Atacand [Candesartan]  Anaphylaxis and Other (See Comments)    Swelling of the mouth and tongue.  Marland Kitchen Lisinopril Anaphylaxis and Other (See Comments)    Swelling of the mouth and tongue.  . Nsaids Anaphylaxis and Other (See Comments)    Swelling of the mouth and tongue.  Marland Kitchen Penicillins Rash and Other (See Comments)    Has patient had a PCN reaction causing immediate rash, facial/tongue/throat swelling, SOB or lightheadedness with hypotension: No Has patient had a PCN reaction causing severe rash involving mucus membranes or skin necrosis: No Has patient had a PCN reaction that required hospitalization No Has patient had a PCN reaction occurring within the last 10 years: No If all of the above answers are "NO", then may proceed with Cephalosporin use.     Outpatient Medications Prior  to Visit  Medication Sig Dispense Refill  . Accu-Chek Softclix Lancets lancets Use as instructed to check blood sugar once daily. E11.22, N18.6, Z99.2 100 each 8  . acetaminophen (TYLENOL) 650 MG CR tablet Take 650-1,300 mg by mouth every 8 (eight) hours as needed for pain.    Marland Kitchen albuterol (PROVENTIL) (2.5 MG/3ML) 0.083% nebulizer solution Take 3 mLs (2.5 mg total) by nebulization every 6 (six) hours as needed for wheezing or shortness of breath. ICD 10:J45.20 75 mL 0  . albuterol (VENTOLIN HFA) 108 (90 Base) MCG/ACT inhaler INHALE 2 PUFFS BY MOUTH EVERY 6 HOURS AS NEEDED FOR WHEEZE OR SHORTNESS OF BREATH 54 each 1  . amLODipine (NORVASC) 10 MG tablet TAKE 1 TABLET BY MOUTH EVERYDAY AT BEDTIME 30 tablet 0  . aspirin 81 MG EC tablet Take 1 tablet by mouth daily.    . Blood Glucose Monitoring Suppl (ACCU-CHEK GUIDE) w/Device KIT 1 each by Does not apply route daily. Use as instructed to check blood sugar once daily. E11.22, N18.6, Z99.2 1 kit 0  . carvedilol (COREG) 25 MG tablet Take 1 tablet (25 mg total) by mouth 2 (two) times daily with a meal. 60 tablet 6  . chlorpheniramine (CHLOR-TRIMETON) 4 MG tablet Take 1 tablet (4 mg total) by mouth 3 (three) times daily. (Patient taking differently: Take 8 mg by mouth daily at 12 noon.) 190 tablet 0  . Cholecalciferol 25 MCG (1000 UT) capsule Take by mouth.    . cinacalcet (SENSIPAR) 30 MG tablet Take 1 tablet by mouth daily.    . Dulaglutide (TRULICITY) 6.96 VE/9.3YB SOPN Inject 0.75 mg into the skin once a week. 2 mL 6  . fluticasone (FLONASE) 50 MCG/ACT nasal spray SPRAY 2 SPRAYS INTO EACH NOSTRIL EVERY DAY 48 mL 1  . glucose blood (ACCU-CHEK GUIDE) test strip Use as instructed to check blood sugar once daily. E11.22, N18.6, Z99.2 30 each 8  . hydrochlorothiazide (MICROZIDE) 12.5 MG capsule TAKE 1 CAPSULE BY MOUTH EVERY MONDAY, WEDNESDAY, FRIDAY.    . magnesium oxide (MAG-OX) 400 MG tablet Take 2 tablets by mouth 2 (two) times daily.    . meclizine  (ANTIVERT) 25 MG tablet Take 25 mg by mouth 3 (three) times daily as needed for dizziness.    . multivitamin (RENA-VIT) TABS tablet Take 1 tablet by mouth daily with lunch.     . mycophenolate (MYFORTIC) 180 MG EC tablet Take by mouth.    . mycophenolate (MYFORTIC) 180 MG EC tablet Take 360 mg by mouth 2 (two) times daily.    . pantoprazole (PROTONIX) 40 MG tablet Take 1 tablet (40 mg total) by mouth 2 (two) times daily. 30 tablet 6  . predniSONE (DELTASONE)  5 MG tablet Take 1 tablet by mouth daily.    Marland Kitchen senna-docusate (SENOKOT-S) 8.6-50 MG tablet Take 1 tablet by mouth 2 (two) times daily.    Marland Kitchen atorvastatin (LIPITOR) 80 MG tablet Take 1 tablet (80 mg total) by mouth daily. 30 tablet 6  . cyclobenzaprine (FLEXERIL) 10 MG tablet Take 1 tablet (10 mg total) by mouth 2 (two) times daily as needed. For back pain 60 tablet 2  . budesonide-formoterol (SYMBICORT) 160-4.5 MCG/ACT inhaler Inhale 2 puffs into the lungs 2 (two) times daily for 1 day. 3 each 1  . clotrimazole (LOTRIMIN) 1 % cream Apply 1 application topically 2 (two) times daily. (Patient not taking: No sig reported) 30 g 1  . phosphorus (K PHOS NEUTRAL) 155-852-130 MG tablet TAKE 2 TABLETS BY MOUTH IN THE MORNING AND AT BEDTIME. (Patient not taking: No sig reported)    . Tacrolimus ER 1 MG TB24 Take by mouth. (Patient not taking: No sig reported)    . valGANciclovir (VALCYTE) 450 MG tablet Take by mouth. (Patient not taking: No sig reported)    . VELPHORO 500 MG chewable tablet Chew 500-1,000 mg by mouth See admin instructions. Take 2 tablets (1000 mg) by mouth with each meals & take 1 tablet (500 mg) by mouth with each snack (Patient not taking: No sig reported)     No facility-administered medications prior to visit.     ROS Review of Systems  Constitutional: Negative for activity change, appetite change and fatigue.  HENT: Negative for congestion, sinus pressure and sore throat.   Eyes: Negative for visual disturbance.  Respiratory:  Negative for cough, chest tightness, shortness of breath and wheezing.   Cardiovascular: Negative for chest pain and palpitations.  Gastrointestinal: Negative for abdominal distention, abdominal pain and constipation.  Endocrine: Negative for polydipsia.  Genitourinary: Negative for dysuria and frequency.  Musculoskeletal: Positive for back pain. Negative for arthralgias.  Skin: Negative for rash.  Neurological: Negative for tremors, light-headedness and numbness.  Hematological: Does not bruise/bleed easily.  Psychiatric/Behavioral: Negative for agitation and behavioral problems.    Objective:  BP 130/77   Pulse 86   Ht 5' 2"  (1.575 m)   Wt 200 lb 3.2 oz (90.8 kg)   SpO2 100%   BMI 36.62 kg/m   BP/Weight 07/31/2020 07/23/2020 06/05/4006  Systolic BP 676 195 093  Diastolic BP 77 87 76  Wt. (Lbs) 200.2 199 207  BMI 36.62 36.4 37.86      Physical Exam Constitutional:      Appearance: She is well-developed.  Neck:     Vascular: No JVD.  Cardiovascular:     Rate and Rhythm: Normal rate.     Heart sounds: Normal heart sounds. No murmur heard.   Pulmonary:     Effort: Pulmonary effort is normal.     Breath sounds: Normal breath sounds. No wheezing or rales.  Chest:     Chest wall: No tenderness.  Abdominal:     General: Bowel sounds are normal. There is no distension.     Palpations: Abdomen is soft. There is no mass.     Tenderness: There is no abdominal tenderness.  Musculoskeletal:        General: No tenderness. Normal range of motion.     Right lower leg: No edema.     Left lower leg: No edema.     Comments: No tenderness to palpation of lumbar spine Negative straight leg raise bilaterally  Neurological:     Mental Status: She is  alert and oriented to person, place, and time.  Psychiatric:        Mood and Affect: Mood normal.     Diabetic Foot Exam - Simple   Simple Foot Form Visual Inspection See comments: Yes Sensation Testing Intact to touch and  monofilament testing bilaterally: Yes Pulse Check Posterior Tibialis and Dorsalis pulse intact bilaterally: Yes Comments No ulcerations or skin breakdown.  Callus on medial aspect of right big toe      CMP Latest Ref Rng & Units 06/17/2020 12/19/2019 04/18/2019  Glucose 65 - 99 mg/dL 117(H) 127(H) 87  BUN 8 - 27 mg/dL 27 26 29(H)  Creatinine 0.57 - 1.00 mg/dL 1.07(H) 1.25(H) 7.00(H)  Sodium 134 - 144 mmol/L 143 139 138  Potassium 3.5 - 5.2 mmol/L 4.4 4.4 4.3  Chloride 96 - 106 mmol/L 104 106 98  CO2 20 - 29 mmol/L 21 21 -  Calcium 8.7 - 10.3 mg/dL 10.4(H) 10.2 -  Total Protein 6.0 - 8.5 g/dL 7.6 7.2 -  Total Bilirubin 0.0 - 1.2 mg/dL 0.3 0.2 -  Alkaline Phos 44 - 121 IU/L 385(H) 269(H) -  AST 0 - 40 IU/L 20 22 -  ALT 0 - 32 IU/L 22 29 -    Lipid Panel     Component Value Date/Time   CHOL 212 (H) 06/17/2020 1003   TRIG 277 (H) 06/17/2020 1003   HDL 44 06/17/2020 1003   CHOLHDL 4.8 (H) 06/17/2020 1003   CHOLHDL 4.6 09/02/2015 0958   VLDL 60 (H) 09/02/2015 0958   LDLCALC 120 (H) 06/17/2020 1003   LDLDIRECT 130.7 04/05/2013 0850    CBC    Component Value Date/Time   WBC 4.4 12/19/2019 1025   WBC 7.9 02/11/2018 1238   RBC 4.39 12/19/2019 1025   RBC 3.93 02/11/2018 1238   HGB 11.4 12/19/2019 1025   HCT 35.4 12/19/2019 1025   PLT 145 (L) 12/19/2019 1025   MCV 81 12/19/2019 1025   MCH 26.0 (L) 12/19/2019 1025   MCH 24.7 (L) 02/11/2018 1238   MCHC 32.2 12/19/2019 1025   MCHC 31.8 02/11/2018 1238   RDW 13.8 12/19/2019 1025   LYMPHSABS 0.7 12/19/2019 1025   MONOABS 0.5 02/11/2018 1238   EOSABS 0.1 12/19/2019 1025   BASOSABS 0.0 12/19/2019 1025    Lab Results  Component Value Date   HGBA1C 6.7 06/17/2020    Assessment & Plan:  1. Chronic right-sided low back pain without sciatica Uncontrolled Advised to apply heat - cyclobenzaprine (FLEXERIL) 10 MG tablet; Take 1 tablet (10 mg total) by mouth 2 (two) times daily as needed. For back pain  Dispense: 60 tablet;  Refill: 2  2. Localized swelling, mass and lump, neck Secondary to venous anastomotic stenosis per vascular note Watchful waiting per vascular  3. Foot callus - Ambulatory referral to Podiatry  4. Hyperlipidemia associated with type 2 diabetes mellitus (HCC) Controlled Switch from atorvastatin to Crestor - rosuvastatin (CRESTOR) 20 MG tablet; Take 1 tablet (20 mg total) by mouth daily.  Dispense: 30 tablet; Refill: 6    Meds ordered this encounter  Medications  . cyclobenzaprine (FLEXERIL) 10 MG tablet    Sig: Take 1 tablet (10 mg total) by mouth 2 (two) times daily as needed. For back pain    Dispense:  60 tablet    Refill:  2  . rosuvastatin (CRESTOR) 20 MG tablet    Sig: Take 1 tablet (20 mg total) by mouth daily.    Dispense:  30 tablet  Refill:  6    Discontinue atorvastatin    Follow-up: Return in about 6 months (around 01/31/2021) for Chronic disease management.       Charlott Rakes, MD, FAAFP. [facility] [place], [place]   07/31/2020, 12:38 PM

## 2020-08-06 ENCOUNTER — Ambulatory Visit (INDEPENDENT_AMBULATORY_CARE_PROVIDER_SITE_OTHER): Payer: Medicare Other | Admitting: Podiatry

## 2020-08-06 ENCOUNTER — Encounter: Payer: Self-pay | Admitting: Podiatry

## 2020-08-06 ENCOUNTER — Other Ambulatory Visit: Payer: Self-pay

## 2020-08-06 DIAGNOSIS — B351 Tinea unguium: Secondary | ICD-10-CM

## 2020-08-06 DIAGNOSIS — M79674 Pain in right toe(s): Secondary | ICD-10-CM | POA: Diagnosis not present

## 2020-08-06 DIAGNOSIS — M79675 Pain in left toe(s): Secondary | ICD-10-CM | POA: Diagnosis not present

## 2020-08-07 ENCOUNTER — Encounter: Payer: Self-pay | Admitting: Podiatry

## 2020-08-07 NOTE — Progress Notes (Signed)
  Subjective:  Patient ID: Rachael George, female    DOB: Apr 30, 1955,  MRN: [id number]  Chief Complaint  Patient presents with  . Callouses    Nail trim    65 y.o. female returns for the above complaint.  Patient presents with thickened elongated dystrophic toenails x10.  Painful to touch.  She is a diabetic with last A1c of 6.5.  She would like to have them debrided down as she is not able to do it herself.  She denies any other acute complaints.  She has not seen anyone else prior to seeing.  Objective:  There were no vitals filed for this visit. Podiatric Exam: Vascular: dorsalis pedis and posterior tibial pulses are palpable bilateral. Capillary return is immediate. Temperature gradient is WNL. Skin turgor WNL  Sensorium: Normal Semmes Weinstein monofilament test. Normal tactile sensation bilaterally. Nail Exam: Pt has thick disfigured discolored nails with subungual debris noted bilateral entire nail hallux through fifth toenails.  Pain on palpation to the nails. Ulcer Exam: There is no evidence of ulcer or pre-ulcerative changes or infection. Orthopedic Exam: Muscle tone and strength are WNL. No limitations in general ROM. No crepitus or effusions noted. HAV  B/L.  Hammer toes 2-5  B/L. Skin: No Porokeratosis. No infection or ulcers    Assessment & Plan:   1. Pain due to onychomycosis of toenails of both feet     Patient was evaluated and treated and all questions answered.  Onychomycosis with pain  -Nails palliatively debrided as below. -Educated on self-care  Procedure: Nail Debridement Rationale: pain  Type of Debridement: manual, sharp debridement. Instrumentation: Nail nipper, rotary burr. Number of Nails: 10  Procedures and Treatment: Consent by patient was obtained for treatment procedures. The patient understood the discussion of treatment and procedures well. All questions were answered thoroughly reviewed. Debridement of mycotic and hypertrophic toenails, 1  through 5 bilateral and clearing of subungual debris. No ulceration, no infection noted.  Return Visit-Office Procedure: Patient instructed to return to the office for a follow up visit 3 months for continued evaluation and treatment.  Boneta Lucks, DPM    Return in about 3 months (around 11/06/2020).

## 2020-09-04 DIAGNOSIS — N1831 Chronic kidney disease, stage 3a: Secondary | ICD-10-CM | POA: Diagnosis not present

## 2020-09-04 DIAGNOSIS — I129 Hypertensive chronic kidney disease with stage 1 through stage 4 chronic kidney disease, or unspecified chronic kidney disease: Secondary | ICD-10-CM | POA: Diagnosis not present

## 2020-09-04 DIAGNOSIS — N2581 Secondary hyperparathyroidism of renal origin: Secondary | ICD-10-CM | POA: Diagnosis not present

## 2020-09-04 DIAGNOSIS — D631 Anemia in chronic kidney disease: Secondary | ICD-10-CM | POA: Diagnosis not present

## 2020-09-04 DIAGNOSIS — Z940 Kidney transplant status: Secondary | ICD-10-CM | POA: Diagnosis not present

## 2020-09-11 ENCOUNTER — Other Ambulatory Visit: Payer: Self-pay | Admitting: *Deleted

## 2020-09-11 NOTE — Patient Outreach (Signed)
[facility]) Care Management  09/11/2020  Rachael George 09-03-1955 [id number]  Unsuccessful outreach attempt made to patient. Patient answered the phone and stated that she would not be able to speak today. She did request that this nurse call back at a later date.   Plan: RN Health Coach will call patient within the month of July.  Emelia Loron RN, BSN [place] [phone] [email or web address]

## 2020-10-16 ENCOUNTER — Other Ambulatory Visit: Payer: Self-pay | Admitting: *Deleted

## 2020-10-16 NOTE — Patient Outreach (Signed)
[facility]) Care Management  [place]  10/16/2020   Rachael George 03-Mar-1956 [id number]  Subjective: Successful telephone outreach call to patient. HIPAA identifiers obtained. Patient states she monitoring her blood sugar 2-3 times daily and that her values have improved. Patient reports her FBS ranges are 90-110 and her after meals values are 120-140. Patient explains that the newly prescribed Trulicity has helped and that she has also lost around 10 pounds. Patient wants to continue to lose weight in small increments until she has lost 40 more pounds. Patient explains that she is eating healthier and is drinking at least 64 ounces of water daily. Nurse congratulated the patient on being dedicated to her health and wellness. Patient states her pain and shortness of breath has improved. She reports doing some chair exercises and states she walks around big department stores routinely for exercise. Patient states she wants to work on her leg strength. Discussed standing and sitting back down on the couch multiple times in a row to gain strength in her legs. (Stand and sit 5 times and try to add more times as you get stronger). Patient did not have any further questions or concerns today and did confirm that she has this nurse's contact number to call her if needed.   Objective:   Encounter Medications:  Outpatient Encounter Medications as of 10/16/2020  Medication Sig Note   Accu-Chek Softclix Lancets lancets Use as instructed to check blood sugar once daily. E11.22, N18.6, Z99.2    acetaminophen (TYLENOL) 650 MG CR tablet Take 650-1,300 mg by mouth every 8 (eight) hours as needed for pain.    albuterol (PROVENTIL) (2.5 MG/3ML) 0.083% nebulizer solution Take 3 mLs (2.5 mg total) by nebulization every 6 (six) hours as needed for wheezing or shortness of breath. ICD 10:J45.20    albuterol (VENTOLIN HFA) 108 (90 Base) MCG/ACT inhaler INHALE 2 PUFFS BY MOUTH EVERY 6 HOURS  AS NEEDED FOR WHEEZE OR SHORTNESS OF BREATH    amLODipine (NORVASC) 10 MG tablet TAKE 1 TABLET BY MOUTH EVERYDAY AT BEDTIME 12/18/2019: Per pt. 5 mg   aspirin 81 MG EC tablet Take 1 tablet by mouth daily.    Blood Glucose Monitoring Suppl (ACCU-CHEK GUIDE) w/Device KIT 1 each by Does not apply route daily. Use as instructed to check blood sugar once daily. E11.22, N18.6, Z99.2    carvedilol (COREG) 25 MG tablet Take 1 tablet (25 mg total) by mouth 2 (two) times daily with a meal. 12/18/2019: Per pt. 12.5 mg   chlorpheniramine (CHLOR-TRIMETON) 4 MG tablet Take 1 tablet (4 mg total) by mouth 3 (three) times daily. (Patient taking differently: Take 8 mg by mouth daily at 12 noon.)    Cholecalciferol 25 MCG (1000 UT) capsule Take by mouth.    cinacalcet (SENSIPAR) 30 MG tablet Take 1 tablet by mouth daily.    cyclobenzaprine (FLEXERIL) 10 MG tablet Take 1 tablet (10 mg total) by mouth 2 (two) times daily as needed. For back pain    Dulaglutide (TRULICITY) 9.02 IO/9.7DZ SOPN Inject 0.75 mg into the skin once a week.    fluticasone (FLONASE) 50 MCG/ACT nasal spray SPRAY 2 SPRAYS INTO EACH NOSTRIL EVERY DAY    glucose blood (ACCU-CHEK GUIDE) test strip Use as instructed to check blood sugar once daily. E11.22, N18.6, Z99.2    hydrochlorothiazide (MICROZIDE) 12.5 MG capsule TAKE 1 CAPSULE BY MOUTH EVERY MONDAY, WEDNESDAY, FRIDAY.    magnesium oxide (MAG-OX) 400 MG tablet Take 2 tablets by mouth  2 (two) times daily.    meclizine (ANTIVERT) 25 MG tablet Take 25 mg by mouth 3 (three) times daily as needed for dizziness.    multivitamin (RENA-VIT) TABS tablet Take 1 tablet by mouth daily with lunch.     mycophenolate (MYFORTIC) 180 MG EC tablet Take by mouth.    mycophenolate (MYFORTIC) 180 MG EC tablet Take 360 mg by mouth 2 (two) times daily.    pantoprazole (PROTONIX) 40 MG tablet Take 1 tablet (40 mg total) by mouth 2 (two) times daily.    rosuvastatin (CRESTOR) 20 MG tablet Take 1 tablet (20 mg total) by  mouth daily.    senna-docusate (SENOKOT-S) 8.6-50 MG tablet Take 1 tablet by mouth 2 (two) times daily.    Tacrolimus ER 1 MG TB24 Take by mouth.    VELPHORO 500 MG chewable tablet Chew 500-1,000 mg by mouth See admin instructions. Take 2 tablets (1000 mg) by mouth with each meals & take 1 tablet (500 mg) by mouth with each snack    budesonide-formoterol (SYMBICORT) 160-4.5 MCG/ACT inhaler Inhale 2 puffs into the lungs 2 (two) times daily for 1 day.    clotrimazole (LOTRIMIN) 1 % cream Apply 1 application topically 2 (two) times daily. (Patient not taking: No sig reported) 10/16/2020: completed   valGANciclovir (VALCYTE) 450 MG tablet Take by mouth. (Patient not taking: No sig reported) 10/16/2020: completed   No facility-administered encounter medications on file as of 10/16/2020.    Functional Status:  No flowsheet data found.  Fall/Depression Screening: Fall Risk  07/31/2020 06/12/2020 03/04/2020  Falls in the past year? 0 0 0  Number falls in past yr: 0 0 0  Injury with Fall? 0 0 0  Risk for fall due to : - Impaired balance/gait;Impaired mobility -  Risk for fall due to: Comment - - -  Follow up - Falls prevention discussed;Education provided;Falls evaluation completed Falls evaluation completed   PHQ 2/9 Scores 07/31/2020 06/17/2020 06/12/2020 12/19/2019 12/18/2019 06/29/2019 04/27/2019  PHQ - 2 Score 0 0 0 0 0 0 0  PHQ- 9 Score 0 0 - 0 - - -    Assessment:   Care Plan There are no care plans that you recently modified to display for this patient.    Goals Addressed   None    Plan: RN Health Coach will send PCP today's assessment note and will call patient within the month of October. Follow-up: Patient agrees to Care Plan and Follow-up.   Emelia Loron RN, BSN [place] [phone] [email or web address]

## 2020-10-16 NOTE — Patient Instructions (Signed)
Goals Addressed             This Visit's Progress    [facility]) Patient will continue to monitor her blood sugar daily and record the values for the next 90 days   On track    Timeframe:  Long-Range Goal Priority:  High Start Date: 03/04/20                         Expected End Date: 03/04/21                 Follow Up Date: 01/19/21   - check blood sugar at prescribed times - check blood sugar if I feel it is too high or too low - enter blood sugar readings and medication or insulin into daily log - take the blood sugar log to all doctor visits    Why is this important?   Checking your blood sugar at home helps to keep it from getting very high or very low.  Writing the results in a diary or log helps the doctor know how to care for you.  Your blood sugar log should have the time, date and the results.  Also, write down the amount of insulin or other medicine that you take.  Other information, like what you ate, exercise done and how you were feeling, will also be helpful.     Notes: Patient reports monitoring her blood sugar 2-3 times daily and records the values.  Updated 06/12/20: Patient states she continues to check her blood sugar 1-2 times daily and she records the values.   Updated 10/16/20: Patient states she monitoring her blood sugar 2-3 times daily and that her values have improved. Patient reports her FBS ranges are 90-110 and her after meals values are 120-140. Patient explains that the newly prescribed Trulicity has helped and that she has also lost around 10 pounds. Patient wants to continue to lose weight in small increments until she has lost 40 more pounds. Patient explains that she is eating healthier and is drinking at least 64 ounces of water daily. Nurse congratulated the patient on being dedicated to her health and wellness.     [facility]) Patient will decrease her A1c by .2-.5 within the next 90 days       Timeframe:  Long-Range Goal Priority:  High Start Date:   06/12/20                           Expected End Date: 09/19/20                      Follow Up Date 02/19/21    - set target A1C  - Discussed limiting intake of bread to 3 times weekly -Encouraged patient to monitor diet for sugar and carbohydrates -Discussed decreasing the amount of sugary drinks  Why is this important?   Your target A1C is decided together by you and your doctor.  It is based on several things like your age and other health issues.    Notes: Patient set an A1c goal of < 6 and her current A1c is 6.3. Patient verbalized that she will decrease eating bread to 3 times a week and will continue to limit the intake of sugar and carbohydrates. Patient previously received a planning healthy meals booklet to use as a guide.   Updated 10/16/20: Patient states her blood sugars have improved. She has a PCP  appointment in 11/22 and should have a new A1c level lab at that time.     [facility]) Patient will increase her physical activity by doing chair exercises 2 times weekly within the next 90 days   On track    Timeframe:  Long-Range Goal Priority:  High Start Date:  06/12/20                           Expected End Date:  06/19/20                     Follow Up Date: 01/19/21  -Discussed doing chair exercises 2 times weekly -Encouraged patient not to sit for long periods -Encouraged continuation of walking around big department stores routinely for exercise -Discussed standing and sitting back down on the couch multiple times in a row to gain strength in her legs. (Stand and sit 5 times and try to add more times as you get stronger)  Notes: Patient states she wants to become more active but this has been difficult due to her asthma and pain in her knees and back. Nurse encouraged with patient to discuss her pain and increase SOB due to asthma with her PCP during her upcoming visit 06/17/20. Patient verbalize that she will begin to do chair exercises 2 times weekly. Patient did receive chair  exercise printouts previously.  Updated 10/16/20: Patient states her pain and shortness of breath has improved. She reports doing some chair exercises and states she walks around big department stores routinely for exercise. Patient states she wants to work on her leg strength. Discussed standing and sitting back down on the couch multiple times in a row to gain strength in her legs. (Stand and sit 5 times and try to add more times as you get stronger)

## 2020-11-06 ENCOUNTER — Encounter: Payer: Self-pay | Admitting: Podiatry

## 2020-11-06 ENCOUNTER — Ambulatory Visit (INDEPENDENT_AMBULATORY_CARE_PROVIDER_SITE_OTHER): Payer: Medicare Other | Admitting: Podiatry

## 2020-11-06 ENCOUNTER — Other Ambulatory Visit: Payer: Self-pay

## 2020-11-06 DIAGNOSIS — M79675 Pain in left toe(s): Secondary | ICD-10-CM

## 2020-11-06 DIAGNOSIS — M79674 Pain in right toe(s): Secondary | ICD-10-CM | POA: Diagnosis not present

## 2020-11-06 DIAGNOSIS — L853 Xerosis cutis: Secondary | ICD-10-CM | POA: Diagnosis not present

## 2020-11-06 DIAGNOSIS — B351 Tinea unguium: Secondary | ICD-10-CM | POA: Diagnosis not present

## 2020-11-07 ENCOUNTER — Encounter: Payer: Self-pay | Admitting: Podiatry

## 2020-11-07 NOTE — Progress Notes (Signed)
  Subjective:  Patient ID: Rachael George, female    DOB: 25-Jun-1955,  MRN: [id number]  Chief Complaint  Patient presents with   Nail Problem    Nail trim    65 y.o. female returns for the above complaint.  Patient presents with thickened elongated dystrophic toenails x10.  Painful to touch.  She is a diabetic with last A1c of 6.5.  She would like to have them debrided down as she is not able to do it herself.  She has secondary complaint of dry skin.  She has not tried any over-the-counter lotion.  She does not do twice a day.  She does not moisturize her skin.  She denies any other acute complaints  Objective:  There were no vitals filed for this visit. Podiatric Exam: Vascular: dorsalis pedis and posterior tibial pulses are palpable bilateral. Capillary return is immediate. Temperature gradient is WNL. Skin turgor WNL  Sensorium: Normal Semmes Weinstein monofilament test. Normal tactile sensation bilaterally. Nail Exam: Pt has thick disfigured discolored nails with subungual debris noted bilateral entire nail hallux through fifth toenails.  Pain on palpation to the nails. Ulcer Exam: There is no evidence of ulcer or pre-ulcerative changes or infection. Orthopedic Exam: Muscle tone and strength are WNL. No limitations in general ROM. No crepitus or effusions noted. HAV  B/L.  Hammer toes 2-5  B/L. Skin: No Porokeratosis. No infection or ulcers.  Xerosis/dryness of the skin noted to bilateral lower extremity.  No skin fissures or open wounds noted.  Moderate in nature    Assessment & Plan:   1. Xerosis of skin   2. Pain due to onychomycosis of toenails of both feet      Patient was evaluated and treated and all questions answered.  Xerosis skin bilateral foot -I explained to the patient the etiology of xerosis and various treatment options were extensively discussed.  I explained to the patient the importance of maintaining moisturization of the skin with application of  over-the-counter lotion such as Eucerin or Luciderm.  I have asked the patient to apply this twice a day.  If unable to resolve patient will benefit from prescription lotion.   Onychomycosis with pain  -Nails palliatively debrided as below. -Educated on self-care  Procedure: Nail Debridement Rationale: pain  Type of Debridement: manual, sharp debridement. Instrumentation: Nail nipper, rotary burr. Number of Nails: 10  Procedures and Treatment: Consent by patient was obtained for treatment procedures. The patient understood the discussion of treatment and procedures well. All questions were answered thoroughly reviewed. Debridement of mycotic and hypertrophic toenails, 1 through 5 bilateral and clearing of subungual debris. No ulceration, no infection noted.  Return Visit-Office Procedure: Patient instructed to return to the office for a follow up visit 3 months for continued evaluation and treatment.  Boneta Lucks, DPM    Return in about 3 months (around 02/06/2021).

## 2020-11-13 DIAGNOSIS — Z940 Kidney transplant status: Secondary | ICD-10-CM | POA: Diagnosis not present

## 2020-11-13 DIAGNOSIS — E785 Hyperlipidemia, unspecified: Secondary | ICD-10-CM | POA: Diagnosis not present

## 2020-11-14 DIAGNOSIS — E559 Vitamin D deficiency, unspecified: Secondary | ICD-10-CM | POA: Diagnosis not present

## 2020-11-14 DIAGNOSIS — Z940 Kidney transplant status: Secondary | ICD-10-CM | POA: Diagnosis not present

## 2020-11-14 DIAGNOSIS — E785 Hyperlipidemia, unspecified: Secondary | ICD-10-CM | POA: Diagnosis not present

## 2020-11-15 DIAGNOSIS — Z940 Kidney transplant status: Secondary | ICD-10-CM | POA: Diagnosis not present

## 2020-11-24 NOTE — Progress Notes (Unsigned)
Subjective:   JANA SWARTZLANDER is a 65 y.o. female who presents for an Initial Medicare Annual Wellness Visit.  I connected with  [name] on 11/24/20 by a audio enabled telemedicine application and verified that I am speaking with the correct person using two identifiers.   Location of patient: Home Location of provider: Office  Persons participating in visit Kizzie Furnish (patient) Loralyn Freshwater RMA   I discussed the limitations of evaluation and management by telemedicine. The patient expressed understanding and agreed to proceed.   Review of Systems     Defer to PCP       Objective:    There were no vitals filed for this visit. There is no height or weight on file to calculate BMI.  Advanced Directives 06/12/2020 05/23/2019 04/27/2019 04/20/2019 04/18/2019 10/05/2018 09/29/2018  Does Patient Have a Medical Advance Directive? No No No No No No No  Would patient like information on creating a medical advance directive? No - Patient declined No - Guardian declined No - Patient declined No - Patient declined No - Patient declined No - Patient declined No - Patient declined    Current Medications (verified) Outpatient Encounter Medications as of 12/01/2020  Medication Sig   Accu-Chek Softclix Lancets lancets Use as instructed to check blood sugar once daily. E11.22, N18.6, Z99.2   acetaminophen (TYLENOL) 650 MG CR tablet Take 650-1,300 mg by mouth every 8 (eight) hours as needed for pain.   albuterol (PROVENTIL) (2.5 MG/3ML) 0.083% nebulizer solution Take 3 mLs (2.5 mg total) by nebulization every 6 (six) hours as needed for wheezing or shortness of breath. ICD 10:J45.20   albuterol (VENTOLIN HFA) 108 (90 Base) MCG/ACT inhaler INHALE 2 PUFFS BY MOUTH EVERY 6 HOURS AS NEEDED FOR WHEEZE OR SHORTNESS OF BREATH   amLODipine (NORVASC) 10 MG tablet TAKE 1 TABLET BY MOUTH EVERYDAY AT BEDTIME   aspirin 81 MG EC tablet Take 1 tablet by mouth daily.   Blood Glucose Monitoring Suppl  (ACCU-CHEK GUIDE) w/Device KIT 1 each by Does not apply route daily. Use as instructed to check blood sugar once daily. E11.22, N18.6, Z99.2   budesonide-formoterol (SYMBICORT) 160-4.5 MCG/ACT inhaler Inhale 2 puffs into the lungs 2 (two) times daily for 1 day.   carvedilol (COREG) 25 MG tablet Take 1 tablet (25 mg total) by mouth 2 (two) times daily with a meal.   chlorpheniramine (CHLOR-TRIMETON) 4 MG tablet Take 1 tablet (4 mg total) by mouth 3 (three) times daily. (Patient taking differently: Take 8 mg by mouth daily at 12 noon.)   Cholecalciferol 25 MCG (1000 UT) capsule Take by mouth.   clotrimazole (LOTRIMIN) 1 % cream Apply 1 application topically 2 (two) times daily. (Patient not taking: No sig reported)   cyclobenzaprine (FLEXERIL) 10 MG tablet Take 1 tablet (10 mg total) by mouth 2 (two) times daily as needed. For back pain   Dulaglutide (TRULICITY) 0.16 PV/3.7SM SOPN Inject 0.75 mg into the skin once a week.   fluticasone (FLONASE) 50 MCG/ACT nasal spray SPRAY 2 SPRAYS INTO EACH NOSTRIL EVERY DAY   glucose blood (ACCU-CHEK GUIDE) test strip Use as instructed to check blood sugar once daily. E11.22, N18.6, Z99.2   magnesium oxide (MAG-OX) 400 MG tablet Take 2 tablets by mouth 2 (two) times daily.   meclizine (ANTIVERT) 25 MG tablet Take 25 mg by mouth 3 (three) times daily as needed for dizziness.   multivitamin (RENA-VIT) TABS tablet Take 1 tablet by mouth daily with lunch.  mycophenolate (MYFORTIC) 180 MG EC tablet Take by mouth.   mycophenolate (MYFORTIC) 180 MG EC tablet Take 360 mg by mouth 2 (two) times daily.   pantoprazole (PROTONIX) 40 MG tablet Take 1 tablet (40 mg total) by mouth 2 (two) times daily.   rosuvastatin (CRESTOR) 20 MG tablet Take 1 tablet (20 mg total) by mouth daily.   senna-docusate (SENOKOT-S) 8.6-50 MG tablet Take 1 tablet by mouth 2 (two) times daily.   Tacrolimus ER 1 MG TB24 Take by mouth.   valGANciclovir (VALCYTE) 450 MG tablet Take by mouth. (Patient  not taking: No sig reported)   VELPHORO 500 MG chewable tablet Chew 500-1,000 mg by mouth See admin instructions. Take 2 tablets (1000 mg) by mouth with each meals & take 1 tablet (500 mg) by mouth with each snack   No facility-administered encounter medications on file as of 12/01/2020.    Allergies (verified) Atacand [candesartan], Lisinopril, Nsaids, and Penicillins   History: Past Medical History:  Diagnosis Date   Anemia    Asthma    ESRD (end stage renal disease) ([place])    MWF   GERD (gastroesophageal reflux disease)    Gout    Heart murmur    born with it- nothing to worry about   Heavy menstrual bleeding    High cholesterol    History of blood transfusion 02/2016- last one   1990's- several ones    History of hiatal hernia    History of kidney stones 1980's   Hyperlipemia 12/07/2012   Hypertension    Hypothyroidism    Multiple gastric ulcers    Pneumonia 2012   Pre-diabetes    Prediabetes    RA (rheumatoid arthritis) ([place])    Sleep apnea    [place]- have CPAP, not able to use every night    Past Surgical History:  Procedure Laterality Date   A/V SHUNTOGRAM N/A 09/08/2016   Procedure: A/V Shuntogram - Left Arm AV Graft;  Surgeon: Serafina Mitchell, MD;  Location: [place] [facility];  Service: Cardiovascular;  Laterality: N/A;   A/V SHUNTOGRAM N/A 09/02/2017   Procedure: A/V SHUNTOGRAM - Left Arm AVG;  Surgeon: Conrad Spray, MD;  Location: [place] [facility];  Service: Cardiovascular;  Laterality: N/A;   A/V SHUNTOGRAM N/A 12/21/2017   Procedure: A/V SHUNTOGRAM - left arm;  Surgeon: Serafina Mitchell, MD;  Location: [place] [facility];  Service: Cardiovascular;  Laterality: N/A;   A/V SHUNTOGRAM Left 10/05/2018   Procedure: A/V SHUNTOGRAM;  Surgeon: Marty Heck, MD;  Location: [place] [facility];  Service: Cardiovascular;  Laterality: Left;   ABDOMINAL HYSTERECTOMY  2000   AV FISTULA PLACEMENT Left 05/25/2016   Procedure: INSERTION OF LEFT UPPER ARM  ARTERIOVENOUS (AV) LOOP GORE-TEX GRAFT ARM;  Surgeon: Elam Dutch, MD;  Location: [place];  Service: Vascular;  Laterality: Left;   [place] Right 12/06/2014   Procedure: FIRST STAGE BASILIC VEIN TRANSPOSITION - RIGHT;  Surgeon: Serafina Mitchell, MD;  Location: [place];  Service: Vascular;  Laterality: Right;   [place] Right 02/07/2015   Procedure: RIGHT ARM SECOND STAGE BASILIC VEIN TRANSPOSITION;  Surgeon: Serafina Mitchell, MD;  Location: [place];  Service: Vascular;  Laterality: Right;   [place] Left 04/22/2016   Procedure: FIRST STAGE BASILIC VEIN TRANSPOSITION LEFT ARM;  Surgeon: Conrad Butte des Morts, MD;  Location: [place];  Service: Vascular;  Laterality: Left;   COLONOSCOPY W/ POLYPECTOMY  ECTOPIC PREGNANCY SURGERY  02/1982   EXCHANGE OF A DIALYSIS CATHETER Right 04/22/2016   Procedure: EXCHANGE OF A DIALYSIS CATHETER - INSERTION RIGHT INTERNAL JUGULAR & REMOVAL FROM LEFT INTERNAL JUGULAR;  Surgeon: Conrad Kosse, MD;  Location: [place];  Service: Vascular;  Laterality: Right;   I & D EXTREMITY Right 02/07/2015   Procedure: IRRIGATION AND DEBRIDEMENT RIGHT ARM HEMATOMA;  Surgeon: Serafina Mitchell, MD;  Location: [place];  Service: Vascular;  Laterality: Right;   INSERTION OF DIALYSIS CATHETER  03/03/2016   Procedure: INSERTION OF DIALYSIS CATHETER;  Surgeon: Elam Dutch, MD;  Location: [place];  Service: Vascular;;   IR AV DIALY SHUNT INTRO [place] W/PTA/IMG LEFT  04/20/2017   IR AV DIALY SHUNT INTRO NEEDLE/INTRACATH INITIAL W/PTA/IMG LEFT  06/24/2017   IR THROMBECTOMY AV FISTULA W/THROMBOLYSIS/PTA INC/SHUNT/IMG LEFT Left 01/04/2017   IR US GUIDE VASC ACCESS LEFT  01/04/2017   left shoulder surgery  08/2005   LIGATION OF ARTERIOVENOUS  FISTULA  03/03/2016   Procedure: LIGATION OF ARTERIOVENOUS  FISTULA;  Surgeon: Elam Dutch, MD;  [facility];  Service: Vascular;;   LIGATION OF ARTERIOVENOUS  FISTULA Right 09/06/2018    Procedure: Resection of pseudoaneursym and end to end repair of right brachial artery;  Surgeon: Elam Dutch, MD;  Location: [facility];  Service: Vascular;  Laterality: Right;   PERIPHERAL VASCULAR BALLOON ANGIOPLASTY  09/08/2016   Procedure: Peripheral Vascular Balloon Angioplasty;  Surgeon: Serafina Mitchell, MD;  Location: [place] [facility];  Service: Cardiovascular;;  left avf   PERIPHERAL VASCULAR BALLOON ANGIOPLASTY  09/02/2017   Procedure: PERIPHERAL VASCULAR BALLOON ANGIOPLASTY;  Surgeon: Conrad Crystal, MD;  Location: [place] [facility];  Service: Cardiovascular;;  left AV Graft   PERIPHERAL VASCULAR BALLOON ANGIOPLASTY  12/21/2017   Procedure: PERIPHERAL VASCULAR BALLOON ANGIOPLASTY;  Surgeon: Serafina Mitchell, MD;  Location: [place] [facility];  Service: Cardiovascular;;  INOMINATE / UPPER ARM AV GRAFT   PERIPHERAL VASCULAR BALLOON ANGIOPLASTY Left 05/12/2018   Procedure: PERIPHERAL VASCULAR BALLOON ANGIOPLASTY;  Surgeon: Marty Heck, MD;  Location: [place] [facility];  Service: Cardiovascular;  Laterality: Left;  Arm fistula   PERIPHERAL VASCULAR BALLOON ANGIOPLASTY Left 10/05/2018   Procedure: PERIPHERAL VASCULAR BALLOON ANGIOPLASTY;  Surgeon: Marty Heck, MD;  Location: [place] [facility];  Service: Cardiovascular;  Laterality: Left;  central and peripheral vein   PERIPHERAL VASCULAR BALLOON ANGIOPLASTY Left 04/18/2019   Procedure: PERIPHERAL VASCULAR BALLOON ANGIOPLASTY;  Surgeon: Serafina Mitchell, MD;  Location: [place] [facility];  Service: Cardiovascular;  Laterality: Left;  arm fistula    TEE WITHOUT CARDIOVERSION N/A 03/06/2016   Procedure: TRANSESOPHAGEAL ECHOCARDIOGRAM (TEE);  Surgeon: Sueanne Margarita, MD;  Location: [facility];  Service: Cardiovascular;  Laterality: N/A;   TUBAL LIGATION  11/1986   Family History  Problem Relation Age of Onset   Hypertension Mother    Breast cancer Maternal Aunt    Deep vein thrombosis Daughter    Hyperlipidemia Daughter     Hypertension Daughter    Prostate cancer Maternal Grandmother    Social History   Socioeconomic History   Marital status: Legally Separated    Spouse name: Not on file   Number of children: 3   Years of education: Not on file   Highest education level: Not on file  Occupational History   Occupation: none  Tobacco Use   Smoking status: Never   Smokeless tobacco: Never  Vaping Use  Vaping Use: Never used  Substance and Sexual Activity   Alcohol use: Yes    Alcohol/week: 0.0 standard drinks    Comment: occ - 1 drink rarely   Drug use: No   Sexual activity: Not Currently  Other Topics Concern   Not on file  Social History Narrative      Home Situation:  3 grand-children lives with patient      Spiritual Beliefs: baptist               Social Determinants of Health   Financial Resource Strain: Not on file  Food Insecurity: No Food Insecurity   Worried About Charity fundraiser in the Last Year: Never true   Ran Out of Food in the Last Year: Never true  Transportation Needs: No Transportation Needs   Lack of Transportation (Medical): No   Lack of Transportation (Non-Medical): No  Physical Activity: Not on file  Stress: Not on file  Social Connections: Not on file    Tobacco Counseling Counseling given: Not Answered   Clinical Intake:                 Diabetic?***         Activities of Daily Living No flowsheet data found.  Patient Care Team: Charlott Rakes, MD as PCP - General (Family Medicine) Corliss Parish, MD as Consulting Physician (Nephrology) [place], [facility] Claudia Desanctis, MD as Consulting Physician (Internal Medicine) Michiel Cowboy, RN as [place] any recent [place] you may have received from other than [facility] providers in the past year (date may be approximate).     Assessment:   This is a routine wellness examination for Larisha.  Hearing/Vision  screen No results found.  Dietary issues and exercise activities discussed:     Goals Addressed   None   Depression Screen PHQ 2/9 Scores 07/31/2020 06/17/2020 06/12/2020 12/19/2019 12/18/2019 06/29/2019 04/27/2019  PHQ - 2 Score 0 0 0 0 0 0 0  PHQ- 9 Score 0 0 - 0 - - -    Fall Risk Fall Risk  10/16/2020 07/31/2020 06/12/2020 03/04/2020 12/19/2019  Falls in the past year? 0 0 0 0 0  Number falls in past yr: 0 0 0 0 -  Injury with Fall? 0 0 0 0 -  Risk for fall due to : Impaired balance/gait;Impaired mobility - Impaired balance/gait;Impaired mobility - -  Risk for fall due to: Comment - - - - -  Follow up Falls prevention discussed;Education provided;Falls evaluation completed - Falls prevention discussed;Education provided;Falls evaluation completed Falls evaluation completed -    FALL RISK PREVENTION PERTAINING TO THE HOME:  Any stairs in or around the home? {YES/NO:21197} If so, are there any without handrails? {YES/NO:21197} Home free of loose throw rugs in walkways, pet beds, electrical cords, etc? {YES/NO:21197} Adequate lighting in your home to reduce risk of falls? {YES/NO:21197}  ASSISTIVE DEVICES UTILIZED TO PREVENT FALLS:  Life alert? {YES/NO:21197} Use of a cane, walker or w/c? {YES/NO:21197} Grab bars in the bathroom? {YES/NO:21197} Shower chair or bench in shower? {YES/NO:21197} Elevated toilet seat or a handicapped toilet? {YES/NO:21197}  TIMED UP AND GO:  Was the test performed?  N/A .  Length of time to ambulate 10 feet: N/A sec.     Cognitive Function:        Immunizations Immunization History  Administered Date(s) Administered   Influenza Split 11/22/2014   Influenza, Seasonal, Injecte, Preservative Fre 11/24/2016   Influenza,inj,Quad  PF,6+ Mos 12/07/2012, 12/25/2013, 11/23/2017   Moderna Sars-Covid-2 Vaccination 11/24/2019   PPD Test 03/13/2015   Pneumococcal Polysaccharide-23 12/25/2013   Tdap 07/21/2012    TDAP status: Up to date  Flu Vaccine  status: Due, Education has been provided regarding the importance of this vaccine. Advised may receive this vaccine at local pharmacy or Health Dept. Aware to provide a copy of the vaccination record if obtained from local pharmacy or Health Dept. Verbalized acceptance and understanding.  Pneumococcal vaccine status: Up to date  Covid-19 vaccine status: Information provided on how to obtain vaccines.   Qualifies for Shingles Vaccine? Yes   Zostavax completed No   Shingrix Completed?: No.    Education has been provided regarding the importance of this vaccine. Patient has been advised to call insurance company to determine out of pocket expense if they have not yet received this vaccine. Advised may also receive vaccine at local pharmacy or Health Dept. Verbalized acceptance and understanding.  Screening Tests Health Maintenance  Topic Date Due   Zoster Vaccines- Shingrix (1 of 2) Never done   Pneumococcal Vaccine 42-78 Years old (2 - PCV) 12/26/2014   COVID-19 Vaccine (4 - Booster for Moderna series) 02/23/2020   FOOT EXAM  05/22/2020   INFLUENZA VACCINE  10/21/2020   HEMOGLOBIN A1C  12/18/2020   OPHTHALMOLOGY EXAM  05/06/2021   MAMMOGRAM  05/20/2021   URINE MICROALBUMIN  06/17/2021   TETANUS/TDAP  07/22/2022   COLONOSCOPY (Pts 45-30yr Insurance coverage will need to be confirmed)  04/15/2025   PNEUMOCOCCAL POLYSACCHARIDE VACCINE AGE 37-64 HIGH RISK  Completed   Hepatitis C Screening  Completed   HIV Screening  Completed   HPV VACCINES  Aged Out   PAP SMEAR-Modifier  Discontinued    Health Maintenance  Health Maintenance Due  Topic Date Due   Zoster Vaccines- Shingrix (1 of 2) Never done   Pneumococcal Vaccine 015622Years old (2 - PCV) 12/26/2014   COVID-19 Vaccine (4 - Booster for Moderna series) 02/23/2020   FOOT EXAM  05/22/2020   INFLUENZA VACCINE  10/21/2020    Colorectal cancer screening: Type of screening: Colonoscopy. Completed 04/16/2015. Repeat every 10  years  Mammogram status: Completed 05/20/2020. Repeat every year  {Bone Density status:21018021}  Lung Cancer Screening: (Low Dose CT Chest recommended if Age 64-80years, 30 pack-year currently smoking OR have quit w/in 15years.) {DOES NOT does:27190::"does not"} qualify.   Lung Cancer Screening Referral: ***  Additional Screening:  Hepatitis C Screening: does qualify; Completed 08/17/2017  Vision Screening: Recommended annual ophthalmology exams for early detection of glaucoma and other disorders of the eye. Is the patient up to date with their annual eye exam?  {YES/NO:21197} Who is the provider or what is the name of the office in which the patient attends annual eye exams? *** If pt is not established with a provider, would they like to be referred to a provider to establish care? {YES/NO:21197}.   Dental Screening: Recommended annual dental exams for proper oral hygiene  Community Resource Referral / Chronic Care Management: CRR required this visit?  {YES/NO:21197}  CCM required this visit?  {YES/NO:21197}     Plan:     I have personally reviewed and noted the following in the patient's chart:   Medical and social history Use of alcohol, tobacco or illicit drugs  Current medications and supplements including opioid prescriptions. {Opioid [phone]} Functional ability and status Nutritional status Physical activity Advanced directives List of other physicians Hospitalizations, surgeries, and ER visits in previous  12 months Vitals Screenings to include cognitive, depression, and falls Referrals and appointments  In addition, I have reviewed and discussed with patient certain preventive protocols, quality metrics, and best practice recommendations. A written personalized care plan for preventive services as well as general preventive health recommendations were provided to patient.     Loralyn Freshwater, [facility]   11/24/2020   Nurse Notes: Non Face to  Face   Ms. Kayleen Memos , Thank you for taking time to come for your Medicare Wellness Visit. I appreciate your ongoing commitment to your health goals. Please review the following plan we discussed and let me know if I can assist you in the future.   These are the goals we discussed:  Goals      [facility]) Patient will continue to monitor her blood sugar daily and record the values for the next 90 days     Timeframe:  Long-Range Goal Priority:  High Start Date: 03/04/20                         Expected End Date: 03/04/21                 Follow Up Date: 01/19/21   - check blood sugar at prescribed times - check blood sugar if I feel it is too high or too low - enter blood sugar readings and medication or insulin into daily log - take the blood sugar log to all doctor visits    Why is this important?   Checking your blood sugar at home helps to keep it from getting very high or very low.  Writing the results in a diary or log helps the doctor know how to care for you.  Your blood sugar log should have the time, date and the results.  Also, write down the amount of insulin or other medicine that you take.  Other information, like what you ate, exercise done and how you were feeling, will also be helpful.     Notes: Patient reports monitoring her blood sugar 2-3 times daily and records the values.  Updated 06/12/20: Patient states she continues to check her blood sugar 1-2 times daily and she records the values.   Updated 10/16/20: Patient states she monitoring her blood sugar 2-3 times daily and that her values have improved. Patient reports her FBS ranges are 90-110 and her after meals values are 120-140. Patient explains that the newly prescribed Trulicity has helped and that she has also lost around 10 pounds. Patient wants to continue to lose weight in small increments until she has lost 40 more pounds. Patient explains that she is eating healthier and is drinking at least 64 ounces of water  daily. Nurse congratulated the patient on being dedicated to her health and wellness.     [facility]) Patient will decrease her A1c by .2-.5 within the next 90 days     Timeframe:  Long-Range Goal Priority:  High Start Date:  06/12/20                           Expected End Date: 09/19/20                      Follow Up Date 02/19/21    - set target A1C  - Discussed limiting intake of bread to 3 times weekly -Encouraged patient to monitor diet for sugar and carbohydrates -Discussed decreasing the amount  of sugary drinks  Why is this important?   Your target A1C is decided together by you and your doctor.  It is based on several things like your age and other health issues.    Notes: Patient set an A1c goal of < 6 and her current A1c is 6.3. Patient verbalized that she will decrease eating bread to 3 times a week and will continue to limit the intake of sugar and carbohydrates. Patient previously received a planning healthy meals booklet to use as a guide.   Updated 10/16/20: Patient states her blood sugars have improved. She has a PCP appointment in 11/22 and should have a new A1c level lab at that time.     [facility]) Patient will increase her physical activity by doing chair exercises 2 times weekly within the next 90 days     Timeframe:  Long-Range Goal Priority:  High Start Date:  06/12/20                           Expected End Date:  06/19/20                     Follow Up Date: 01/19/21  -Discussed doing chair exercises 2 times weekly -Encouraged patient not to sit for long periods -Encouraged continuation of walking around big department stores routinely for exercise -Discussed standing and sitting back down on the couch multiple times in a row to gain strength in her legs. (Stand and sit 5 times and try to add more times as you get stronger)  Notes: Patient states she wants to become more active but this has been difficult due to her asthma and pain in her knees and back. Nurse encouraged  with patient to discuss her pain and increase SOB due to asthma with her PCP during her upcoming visit 06/17/20. Patient verbalize that she will begin to do chair exercises 2 times weekly. Patient did receive chair exercise printouts previously.  Updated 10/16/20: Patient states her pain and shortness of breath has improved. She reports doing some chair exercises and states she walks around big department stores routinely for exercise. Patient states she wants to work on her leg strength. Discussed standing and sitting back down on the couch multiple times in a row to gain strength in her legs. (Stand and sit 5 times and try to add more times as you get stronger)        This is a list of the screening recommended for you and due dates:  Health Maintenance  Topic Date Due   Zoster (Shingles) Vaccine (1 of 2) Never done   Pneumococcal Vaccination (2 - PCV) 12/26/2014   COVID-19 Vaccine (4 - Booster for Moderna series) 02/23/2020   Complete foot exam   05/22/2020   Flu Shot  10/21/2020   Hemoglobin A1C  12/18/2020   Eye exam for diabetics  05/06/2021   Mammogram  05/20/2021   Urine Protein Check  06/17/2021   Tetanus Vaccine  07/22/2022   Colon Cancer Screening  04/15/2025   Pneumococcal vaccine  Completed   Hepatitis C Screening: USPSTF Recommendation to screen - Ages 18-79 yo.  Completed   HIV Screening  Completed   HPV Vaccine  Aged Out   Pap Smear  Discontinued

## 2020-11-24 NOTE — Patient Instructions (Signed)
Health Maintenance, Female Adopting a healthy lifestyle and getting preventive care are important in promoting health and wellness. Ask your health care provider about: The right schedule for you to have regular tests and exams. Things you can do on your own to prevent diseases and keep yourself healthy. What should I know about diet, weight, and exercise? Eat a healthy diet  Eat a diet that includes plenty of vegetables, fruits, low-fat dairy products, and lean protein. Do not eat a lot of foods that are high in solid fats, added sugars, or sodium. Maintain a healthy weight Body mass index (BMI) is used to identify weight problems. It estimates body fat based on height and weight. Your health care provider can help determine your BMI and help you achieve or maintain a healthy weight. Get regular exercise Get regular exercise. This is one of the most important things you can do for your health. Most adults should: Exercise for at least 150 minutes each week. The exercise should increase your heart rate and make you sweat (moderate-intensity exercise). Do strengthening exercises at least twice a week. This is in addition to the moderate-intensity exercise. Spend less time sitting. Even light physical activity can be beneficial. Watch cholesterol and blood lipids Have your blood tested for lipids and cholesterol at 64 years of age, then have this test every 5 years. Have your cholesterol levels checked more often if: Your lipid or cholesterol levels are high. You are older than 64 years of age. You are at high risk for heart disease. What should I know about cancer screening? Depending on your health history and family history, you may need to have cancer screening at various ages. This may include screening for: Breast cancer. Cervical cancer. Colorectal cancer. Skin cancer. Lung cancer. What should I know about heart disease, diabetes, and high blood pressure? Blood pressure and heart  disease High blood pressure causes heart disease and increases the risk of stroke. This is more likely to develop in people who have high blood pressure readings, are of African descent, or are overweight. Have your blood pressure checked: Every 3-5 years if you are 18-39 years of age. Every year if you are 40 years old or older. Diabetes Have regular diabetes screenings. This checks your fasting blood sugar level. Have the screening done: Once every three years after age 40 if you are at a normal weight and have a low risk for diabetes. More often and at a younger age if you are overweight or have a high risk for diabetes. What should I know about preventing infection? Hepatitis B If you have a higher risk for hepatitis B, you should be screened for this virus. Talk with your health care provider to find out if you are at risk for hepatitis B infection. Hepatitis C Testing is recommended for: Everyone born from 1945 through 1965. Anyone with known risk factors for hepatitis C. Sexually transmitted infections (STIs) Get screened for STIs, including gonorrhea and chlamydia, if: You are sexually active and are younger than 64 years of age. You are older than 64 years of age and your health care provider tells you that you are at risk for this type of infection. Your sexual activity has changed since you were last screened, and you are at increased risk for chlamydia or gonorrhea. Ask your health care provider if you are at risk. Ask your health care provider about whether you are at high risk for HIV. Your health care provider may recommend a prescription medicine   to help prevent HIV infection. If you choose to take medicine to prevent HIV, you should first get tested for HIV. You should then be tested every 3 months for as long as you are taking the medicine. Pregnancy If you are about to stop having your period (premenopausal) and you may become pregnant, seek counseling before you get  pregnant. Take 400 to 800 micrograms (mcg) of folic acid every day if you become pregnant. Ask for birth control (contraception) if you want to prevent pregnancy. Osteoporosis and menopause Osteoporosis is a disease in which the bones lose minerals and strength with aging. This can result in bone fractures. If you are 65 years old or older, or if you are at risk for osteoporosis and fractures, ask your health care provider if you should: Be screened for bone loss. Take a calcium or vitamin D supplement to lower your risk of fractures. Be given hormone replacement therapy (HRT) to treat symptoms of menopause. Follow these instructions at home: Lifestyle Do not use any products that contain nicotine or tobacco, such as cigarettes, e-cigarettes, and chewing tobacco. If you need help quitting, ask your health care provider. Do not use street drugs. Do not share needles. Ask your health care provider for help if you need support or information about quitting drugs. Alcohol use Do not drink alcohol if: Your health care provider tells you not to drink. You are pregnant, may be pregnant, or are planning to become pregnant. If you drink alcohol: Limit how much you use to 0-1 drink a day. Limit intake if you are breastfeeding. Be aware of how much alcohol is in your drink. In the [place] one drink equals one 12 oz bottle of beer (355 mL), one 5 oz glass of wine (148 mL), or one 1 oz glass of hard liquor (44 mL). General instructions Schedule regular health, dental, and eye exams. Stay current with your vaccines. Tell your health care provider if: You often feel depressed. You have ever been abused or do not feel safe at home. Summary Adopting a healthy lifestyle and getting preventive care are important in promoting health and wellness. Follow your health care provider's instructions about healthy diet, exercising, and getting tested or screened for diseases. Follow your health care provider's  instructions on monitoring your cholesterol and blood pressure. This information is not intended to replace advice given to you by your health care provider. Make sure you discuss any questions you have with your health care provider. Document Revised: 05/17/2020 Document Reviewed: 03/02/2018 Elsevier Patient Education  2022 Elsevier Inc.  

## 2020-12-01 ENCOUNTER — Ambulatory Visit: Payer: Medicare Other

## 2020-12-01 DIAGNOSIS — Z0000 Encounter for general adult medical examination without abnormal findings: Secondary | ICD-10-CM

## 2020-12-02 ENCOUNTER — Other Ambulatory Visit: Payer: Self-pay | Admitting: Family Medicine

## 2020-12-02 DIAGNOSIS — N1831 Chronic kidney disease, stage 3a: Secondary | ICD-10-CM

## 2020-12-02 DIAGNOSIS — E1122 Type 2 diabetes mellitus with diabetic chronic kidney disease: Secondary | ICD-10-CM

## 2020-12-10 DIAGNOSIS — N1831 Chronic kidney disease, stage 3a: Secondary | ICD-10-CM | POA: Diagnosis not present

## 2020-12-10 DIAGNOSIS — N2581 Secondary hyperparathyroidism of renal origin: Secondary | ICD-10-CM | POA: Diagnosis not present

## 2020-12-10 DIAGNOSIS — D631 Anemia in chronic kidney disease: Secondary | ICD-10-CM | POA: Diagnosis not present

## 2020-12-10 DIAGNOSIS — I129 Hypertensive chronic kidney disease with stage 1 through stage 4 chronic kidney disease, or unspecified chronic kidney disease: Secondary | ICD-10-CM | POA: Diagnosis not present

## 2020-12-10 DIAGNOSIS — Z940 Kidney transplant status: Secondary | ICD-10-CM | POA: Diagnosis not present

## 2020-12-29 ENCOUNTER — Encounter: Payer: Self-pay | Admitting: Family Medicine

## 2020-12-30 ENCOUNTER — Other Ambulatory Visit: Payer: Self-pay | Admitting: Family Medicine

## 2020-12-30 MED ORDER — NYSTATIN 100000 UNIT/GM EX POWD: 1.0000 "application " | Freq: Three times a day (TID) | CUTANEOUS | 0 refills | Status: DC

## 2020-12-30 NOTE — Telephone Encounter (Signed)
Requested Prescriptions  Pending Prescriptions Disp Refills  . pantoprazole (PROTONIX) 40 MG tablet [Pharmacy Med Name: PANTOPRAZOLE '40MG'$  TABLETS] 30 tablet 2    Sig: TAKE 1 TABLET(40 MG) BY MOUTH TWICE DAILY     Gastroenterology: Proton Pump Inhibitors Passed - 12/30/2020  8:03 AM      Passed - Valid encounter within last 12 months    Recent Outpatient Visits          5 months ago Localized swelling, mass and lump, neck   [place], [place], MD   6 months ago Type 2 diabetes mellitus with stage 3a chronic kidney disease, without long-term current use of insulin ([place])   [facility] [place], [place], MD   1 year ago Type 2 diabetes mellitus with chronic kidney disease on chronic dialysis, without long-term current use of insulin ([place])   [place], [place], MD   1 year ago Type 2 diabetes mellitus with chronic kidney disease on chronic dialysis, without long-term current use of insulin ([place])   [place], Enobong, MD   2 years ago End stage renal disease [facility])   [place], MD      Future Appointments            In 1 month Charlott Rakes, MD [place]

## 2021-01-06 ENCOUNTER — Other Ambulatory Visit: Payer: Self-pay | Admitting: Family Medicine

## 2021-01-06 DIAGNOSIS — Z992 Dependence on renal dialysis: Secondary | ICD-10-CM

## 2021-01-06 DIAGNOSIS — E1122 Type 2 diabetes mellitus with diabetic chronic kidney disease: Secondary | ICD-10-CM

## 2021-01-06 DIAGNOSIS — N186 End stage renal disease: Secondary | ICD-10-CM

## 2021-01-07 MED ORDER — ACCU-CHEK GUIDE VI STRP: ORAL_STRIP | 1 refills | Status: AC

## 2021-01-07 NOTE — Addendum Note (Signed)
Addended by: Valli Glance F on: 01/07/2021 09:57 AM   Modules accepted: Orders

## 2021-01-07 NOTE — Telephone Encounter (Signed)
Requested Prescriptions  Pending Prescriptions Disp Refills  . glucose blood (ACCU-CHEK GUIDE) test strip [Pharmacy Med Name: ACCU-CHEK GUIDE TEST STRIPS 50] 50 strip 1    Sig: USE TO TEST BLOOD SUGAR LEVELS DAILY     Endocrinology: Diabetes - Testing Supplies Passed - 01/06/2021  4:31 PM      Passed - Valid encounter within last 12 months    Recent Outpatient Visits          5 months ago Localized swelling, mass and lump, neck   [place], [place], MD   6 months ago Type 2 diabetes mellitus with stage 3a chronic kidney disease, without long-term current use of insulin ([place])   [place], [place], MD   1 year ago Type 2 diabetes mellitus with chronic kidney disease on chronic dialysis, without long-term current use of insulin ([place])   [place], [employer], MD   1 year ago Type 2 diabetes mellitus with chronic kidney disease on chronic dialysis, without long-term current use of insulin ([place])   [place], Enobong, MD   2 years ago End stage renal disease [facility])   [place], Enobong, MD      Future Appointments            In 3 weeks Charlott Rakes, MD [place]

## 2021-01-12 ENCOUNTER — Other Ambulatory Visit: Payer: Self-pay | Admitting: Family Medicine

## 2021-01-12 DIAGNOSIS — E1122 Type 2 diabetes mellitus with diabetic chronic kidney disease: Secondary | ICD-10-CM

## 2021-01-12 DIAGNOSIS — N186 End stage renal disease: Secondary | ICD-10-CM

## 2021-01-13 NOTE — Telephone Encounter (Signed)
Requested Prescriptions  Pending Prescriptions Disp Refills  . Accu-Chek Softclix Lancets lancets [Pharmacy Med Name: SOFTCLIX LANCETS] 100 each 8    Sig: USE AS DIRECTED TO TEST DAILY     Endocrinology: Diabetes - Testing Supplies Passed - 01/12/2021 11:56 AM      Passed - Valid encounter within last 12 months    Recent Outpatient Visits          5 months ago Localized swelling, mass and lump, neck   [place], [place], MD   7 months ago Type 2 diabetes mellitus with stage 3a chronic kidney disease, without long-term current use of insulin ([place])   [facility] [place], [place], MD   1 year ago Type 2 diabetes mellitus with chronic kidney disease on chronic dialysis, without long-term current use of insulin ([place])   [place], [place], MD   1 year ago Type 2 diabetes mellitus with chronic kidney disease on chronic dialysis, without long-term current use of insulin ([place])   [place], Enobong, MD   2 years ago End stage renal disease [facility])   [place], MD      Future Appointments            In 3 weeks Charlott Rakes, MD [place]

## 2021-01-16 ENCOUNTER — Other Ambulatory Visit: Payer: Self-pay | Admitting: *Deleted

## 2021-01-16 NOTE — Patient Outreach (Signed)
[facility]) Care Management  01/16/2021  Rachael George February 16, 1956 [id number]  Unsuccessful outreach attempt made to patient. RN Health Coach left HIPAA compliant voicemail message along with her contact information.  Plan: RN Health Coach will call patient within the month of November.  Rachael Loron RN, BSN [place] [phone] [email or web address]

## 2021-01-29 ENCOUNTER — Other Ambulatory Visit: Payer: Self-pay | Admitting: Family Medicine

## 2021-01-29 DIAGNOSIS — E785 Hyperlipidemia, unspecified: Secondary | ICD-10-CM

## 2021-01-29 DIAGNOSIS — E1169 Type 2 diabetes mellitus with other specified complication: Secondary | ICD-10-CM

## 2021-01-29 NOTE — Telephone Encounter (Signed)
Requested medication (s) are due for refill today:   Yes  Requested medication (s) are on the active medication list:   Yes  Future visit scheduled:   Yes in 5 days with [place]   Last ordered: 07/31/2020 #30, 6 refills  Returned because no more refills remain on this rx.     Requested Prescriptions  Pending Prescriptions Disp Refills   rosuvastatin (CRESTOR) 20 MG tablet [Pharmacy Med Name: ROSUVASTATIN 20MG  TABLETS] 30 tablet 6    Sig: TAKE 1 TABLET(20 MG) BY MOUTH DAILY     Cardiovascular:  Antilipid - Statins Failed - 01/29/2021  8:03 AM      Failed - Total Cholesterol in normal range and within 360 days    Cholesterol, Total  Date Value Ref Range Status  06/17/2020 212 (H) 100 - 199 mg/dL Final          Failed - LDL in normal range and within 360 days    LDL Chol Calc (NIH)  Date Value Ref Range Status  06/17/2020 120 (H) 0 - 99 mg/dL Final   Direct LDL  Date Value Ref Range Status  04/05/2013 130.7 mg/dL Final    Comment:    Optimal:  <100 mg/dLNear or Above Optimal:  100-129 mg/dLBorderline High:  130-159 mg/dLHigh:  160-189 mg/dLVery High:  >190 mg/dL          Failed - Triglycerides in normal range and within 360 days    Triglycerides  Date Value Ref Range Status  06/17/2020 277 (H) 0 - 149 mg/dL Final          Passed - HDL in normal range and within 360 days    HDL  Date Value Ref Range Status  06/17/2020 44 >39 mg/dL Final          Passed - Patient is not pregnant      Passed - Valid encounter within last 12 months    Recent Outpatient Visits           6 months ago Localized swelling, mass and lump, neck   [place], [place], MD   7 months ago Type 2 diabetes mellitus with stage 3a chronic kidney disease, without long-term current use of insulin ([place])   [place], [place], MD   1 year ago Type 2 diabetes mellitus with chronic kidney disease on chronic dialysis, without  long-term current use of insulin ([place])   [place], [place], MD   1 year ago Type 2 diabetes mellitus with chronic kidney disease on chronic dialysis, without long-term current use of insulin ([place])   [place], Enobong, MD   2 years ago End stage renal disease [facility])   [place], [place], MD       Future Appointments             In 5 days Charlott Rakes, MD [place]

## 2021-02-01 ENCOUNTER — Other Ambulatory Visit: Payer: Self-pay | Admitting: Family Medicine

## 2021-02-01 DIAGNOSIS — E785 Hyperlipidemia, unspecified: Secondary | ICD-10-CM

## 2021-02-01 DIAGNOSIS — E1169 Type 2 diabetes mellitus with other specified complication: Secondary | ICD-10-CM

## 2021-02-01 NOTE — Telephone Encounter (Signed)
Requested medication (s) are due for refill today: Yes  Requested medication (s) are on the active medication list: Yes  Last refill:  01/30/21  Future visit scheduled: No  Notes to clinic:  Unable to refill per protocol, appointment needed.      Requested Prescriptions  Pending Prescriptions Disp Refills   rosuvastatin (CRESTOR) 20 MG tablet [Pharmacy Med Name: ROSUVASTATIN 20MG  TABLETS] 90 tablet     Sig: TAKE 1 TABLET(20 MG) BY MOUTH DAILY     Cardiovascular:  Antilipid - Statins Failed - 02/01/2021  8:53 AM      Failed - Total Cholesterol in normal range and within 360 days    Cholesterol, Total  Date Value Ref Range Status  06/17/2020 212 (H) 100 - 199 mg/dL Final          Failed - LDL in normal range and within 360 days    LDL Chol Calc (NIH)  Date Value Ref Range Status  06/17/2020 120 (H) 0 - 99 mg/dL Final   Direct LDL  Date Value Ref Range Status  04/05/2013 130.7 mg/dL Final    Comment:    Optimal:  <100 mg/dLNear or Above Optimal:  100-129 mg/dLBorderline High:  130-159 mg/dLHigh:  160-189 mg/dLVery High:  >190 mg/dL          Failed - Triglycerides in normal range and within 360 days    Triglycerides  Date Value Ref Range Status  06/17/2020 277 (H) 0 - 149 mg/dL Final          Passed - HDL in normal range and within 360 days    HDL  Date Value Ref Range Status  06/17/2020 44 >39 mg/dL Final          Passed - Patient is not pregnant      Passed - Valid encounter within last 12 months    Recent Outpatient Visits           6 months ago Localized swelling, mass and lump, neck   [place], [place], MD   7 months ago Type 2 diabetes mellitus with stage 3a chronic kidney disease, without long-term current use of insulin ([place])   [place], [place], MD   1 year ago Type 2 diabetes mellitus with chronic kidney disease on chronic dialysis, without long-term current use of  insulin ([place])   [place], [place], MD   1 year ago Type 2 diabetes mellitus with chronic kidney disease on chronic dialysis, without long-term current use of insulin ([place])   [place], [place]obong, MD   2 years ago End stage renal disease [facility])    [facility] Charlott Rakes, MD

## 2021-02-03 ENCOUNTER — Ambulatory Visit: Payer: Medicare Other | Admitting: Family Medicine

## 2021-02-04 ENCOUNTER — Other Ambulatory Visit: Payer: Self-pay | Admitting: *Deleted

## 2021-02-04 NOTE — Patient Outreach (Signed)
[facility]) Care Management  02/04/2021  Rachael George 12-01-1955 [id number]  Unsuccessful outreach attempt made to patient. RN Health Coach left HIPAA compliant voicemail message along with her contact information.  Plan: RN Health Coach will call patient within the month of December.  Emelia Loron RN, BSN [place] [phone] [email or web address]

## 2021-02-07 ENCOUNTER — Ambulatory Visit (INDEPENDENT_AMBULATORY_CARE_PROVIDER_SITE_OTHER): Payer: Medicare Other | Admitting: Podiatry

## 2021-02-07 ENCOUNTER — Encounter: Payer: Self-pay | Admitting: Podiatry

## 2021-02-07 ENCOUNTER — Other Ambulatory Visit: Payer: Self-pay

## 2021-02-07 DIAGNOSIS — E1129 Type 2 diabetes mellitus with other diabetic kidney complication: Secondary | ICD-10-CM

## 2021-02-07 DIAGNOSIS — R42 Dizziness and giddiness: Secondary | ICD-10-CM | POA: Insufficient documentation

## 2021-02-07 DIAGNOSIS — Z9289 Personal history of other medical treatment: Secondary | ICD-10-CM | POA: Insufficient documentation

## 2021-02-07 DIAGNOSIS — B351 Tinea unguium: Secondary | ICD-10-CM | POA: Diagnosis not present

## 2021-02-07 DIAGNOSIS — E119 Type 2 diabetes mellitus without complications: Secondary | ICD-10-CM

## 2021-02-07 DIAGNOSIS — N200 Calculus of kidney: Secondary | ICD-10-CM | POA: Insufficient documentation

## 2021-02-07 DIAGNOSIS — B9681 Helicobacter pylori [H. pylori] as the cause of diseases classified elsewhere: Secondary | ICD-10-CM | POA: Insufficient documentation

## 2021-02-07 DIAGNOSIS — M79674 Pain in right toe(s): Secondary | ICD-10-CM | POA: Diagnosis not present

## 2021-02-07 DIAGNOSIS — M79675 Pain in left toe(s): Secondary | ICD-10-CM | POA: Diagnosis not present

## 2021-02-07 DIAGNOSIS — N96 Recurrent pregnancy loss: Secondary | ICD-10-CM | POA: Insufficient documentation

## 2021-02-07 DIAGNOSIS — R1013 Epigastric pain: Secondary | ICD-10-CM | POA: Insufficient documentation

## 2021-02-07 DIAGNOSIS — M2011 Hallux valgus (acquired), right foot: Secondary | ICD-10-CM | POA: Diagnosis not present

## 2021-02-07 DIAGNOSIS — M2012 Hallux valgus (acquired), left foot: Secondary | ICD-10-CM

## 2021-02-07 DIAGNOSIS — Z940 Kidney transplant status: Secondary | ICD-10-CM

## 2021-02-07 DIAGNOSIS — Z992 Dependence on renal dialysis: Secondary | ICD-10-CM | POA: Insufficient documentation

## 2021-02-07 DIAGNOSIS — Z8719 Personal history of other diseases of the digestive system: Secondary | ICD-10-CM | POA: Insufficient documentation

## 2021-02-07 DIAGNOSIS — N189 Chronic kidney disease, unspecified: Secondary | ICD-10-CM | POA: Insufficient documentation

## 2021-02-07 DIAGNOSIS — K279 Peptic ulcer, site unspecified, unspecified as acute or chronic, without hemorrhage or perforation: Secondary | ICD-10-CM | POA: Insufficient documentation

## 2021-02-07 NOTE — Progress Notes (Signed)
ANNUAL DIABETIC FOOT EXAM  Subjective: Rachael George presents today for for annual diabetic foot examination.  Patient relates 6 year h/o diabetes. She is s/p kidney transplant (May, 2021, [place], [place] donor kidney) and is on immunosuppressive therapy.  Patient denies any h/o foot wounds.  Patient denies any numbness, tingling, burning, or pins/needle sensation in feet.  Patient's blood sugar was 141 mg/dl at 1 pm on Monday. Last A1c was 5.3%. Patient did not check blood glucose this morning.  Rachael Rakes, MD is patient's PCP. Last visit was 07/31/2020.  Past Medical History:  Diagnosis Date   Anemia    Asthma    ESRD (end stage renal disease) ([place])    MWF   GERD (gastroesophageal reflux disease)    Gout    Heart murmur    born with it- nothing to worry about   Heavy menstrual bleeding    High cholesterol    History of blood transfusion 02/2016- last one   1990's- several ones    History of hiatal hernia    History of kidney stones 1980's   Hyperlipemia 12/07/2012   Hypertension    Hypothyroidism    Multiple gastric ulcers    Pneumonia 2012   Pre-diabetes    Prediabetes    RA (rheumatoid arthritis) ([place])    Sleep apnea    [place]- have CPAP, not able to use every night    Patient Active Problem List   Diagnosis Date Noted   Chronic kidney disease 02/07/2021   Epigastric pain 02/07/2021   H pylori ulcer 02/07/2021   H/O transfusion of packed red blood cells 02/07/2021   Hemodialysis access, AV graft ([place]) 02/07/2021   History of esophageal ulcer 02/07/2021   History of multiple miscarriages 02/07/2021   Kidney stones 02/07/2021   Vertigo 02/07/2021   ESRD (end stage renal disease) ([place]) 07/23/2020   Kidney transplant recipient 12/19/2019   Wound infection 10/03/2019   Hypomagnesemia 08/22/2019   Immunosuppressive management encounter following kidney transplant 08/07/2019   Immunosuppression ([place]) 07/28/2019   Headache, unspecified  04/28/2019   Allergy, unspecified, initial encounter 10/24/2018   Anaphylactic shock, unspecified, initial encounter 10/24/2018   GERD (gastroesophageal reflux disease) 08/30/2018   OSA (obstructive sleep apnea) 09/07/2017   Other fatigue 07/05/2017   Encounter for removal of sutures 06/25/2017   Pain, unspecified 06/02/2017   Diarrhea 05/28/2017   Fever, unspecified 05/28/2017   Iron deficiency anemia 05/28/2017   Other specified coagulation defects ([place]) 05/28/2017   Encounter for screening for respiratory tuberculosis 05/28/2017   Pruritus, unspecified 05/28/2017   Shortness of breath 05/28/2017   Type 2 diabetes mellitus with diabetic peripheral angiopathy without gangrene ([place]) 05/28/2017   Cough 05/20/2017   Periodic limb movements of sleep 03/17/2017   Eczema 09/17/2016   Acute blood loss anemia 03/03/2016   Hypovolemic shock ([place]) 03/03/2016   Staphylococcus aureus bacteremia 03/03/2016   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[place]) 03/02/2016   Asthma 10/15/2015   Hemodialysis status ([place]) 10/15/2015   Insomnia 10/15/2015   Sleep paralysis 10/15/2015   Sleep talking 10/15/2015   Vision problems 10/15/2015   Hematoma 02/07/2015   Chronic gout 10/05/2014   Anemia in chronic kidney disease 12/27/2013   Essential hypertension, benign 12/07/2012   Type 2 diabetes mellitus ([place]) 12/07/2012   Low back pain 12/07/2012   Right leg numbness 12/07/2012   Asthma, mild intermittent 12/07/2012   Hyperlipemia 12/07/2012   Obesity, unspecified 12/07/2012   Past Surgical History:  Procedure Laterality Date  A/V SHUNTOGRAM N/A 09/08/2016   Procedure: A/V Shuntogram - Left Arm AV Graft;  Surgeon: Serafina Mitchell, MD;  Location: [facility];  Service: Cardiovascular;  Laterality: N/A;   A/V SHUNTOGRAM N/A 09/02/2017   Procedure: A/V SHUNTOGRAM - Left Arm AVG;  Surgeon: Conrad Woodstock, MD;  Location: [facility];  Service: Cardiovascular;  Laterality: N/A;   A/V SHUNTOGRAM  N/A 12/21/2017   Procedure: A/V SHUNTOGRAM - left arm;  Surgeon: Serafina Mitchell, MD;  Location: [facility];  Service: Cardiovascular;  Laterality: N/A;   A/V SHUNTOGRAM Left 10/05/2018   Procedure: A/V SHUNTOGRAM;  Surgeon: Marty Heck, MD;  Location: [facility];  Service: Cardiovascular;  Laterality: Left;   ABDOMINAL HYSTERECTOMY  2000   AV FISTULA PLACEMENT Left 05/25/2016   Procedure: INSERTION OF LEFT UPPER ARM ARTERIOVENOUS (AV) LOOP GORE-TEX GRAFT ARM;  Surgeon: Elam Dutch, MD;  Location: [facility];  Service: Vascular;  Laterality: Left;   [place] Right 12/06/2014   Procedure: FIRST STAGE BASILIC VEIN TRANSPOSITION - RIGHT;  Surgeon: Serafina Mitchell, MD;  Location: [facility];  Service: Vascular;  Laterality: Right;   [place] Right 02/07/2015   Procedure: RIGHT ARM SECOND STAGE BASILIC VEIN TRANSPOSITION;  Surgeon: Serafina Mitchell, MD;  Location: [facility];  Service: Vascular;  Laterality: Right;   [place] Left 04/22/2016   Procedure: FIRST STAGE BASILIC VEIN TRANSPOSITION LEFT ARM;  Surgeon: Conrad Christine, MD;  Location: [facility];  Service: Vascular;  Laterality: Left;   COLONOSCOPY W/ POLYPECTOMY     ECTOPIC PREGNANCY SURGERY  02/1982   EXCHANGE OF A DIALYSIS CATHETER Right 04/22/2016   Procedure: EXCHANGE OF A DIALYSIS CATHETER - INSERTION RIGHT INTERNAL JUGULAR & REMOVAL FROM LEFT INTERNAL JUGULAR;  Surgeon: Conrad Okabena, MD;  Location: [facility];  Service: Vascular;  Laterality: Right;   I & D EXTREMITY Right 02/07/2015   Procedure: IRRIGATION AND DEBRIDEMENT RIGHT ARM HEMATOMA;  Surgeon: Serafina Mitchell, MD;  Location: [facility];  Service: Vascular;  Laterality: Right;   INSERTION OF DIALYSIS CATHETER  03/03/2016   Procedure: INSERTION OF DIALYSIS CATHETER;  Surgeon: Elam Dutch, MD;  Location: [facility];  Service: Vascular;;   IR AV DIALY SHUNT INTRO NEEDLE/INTRACATH INITIAL W/PTA/IMG LEFT  04/20/2017   IR AV DIALY SHUNT  INTRO NEEDLE/INTRACATH INITIAL W/PTA/IMG LEFT  06/24/2017   IR THROMBECTOMY AV FISTULA W/THROMBOLYSIS/PTA INC/SHUNT/IMG LEFT Left 01/04/2017   IR US GUIDE VASC ACCESS LEFT  01/04/2017   left shoulder surgery  08/2005   LIGATION OF ARTERIOVENOUS  FISTULA  03/03/2016   Procedure: LIGATION OF ARTERIOVENOUS  FISTULA;  Surgeon: Elam Dutch, MD;  Location: [facility];  Service: Vascular;;   LIGATION OF ARTERIOVENOUS  FISTULA Right 09/06/2018   Procedure: Resection of pseudoaneursym and end to end repair of right brachial artery;  Surgeon: Elam Dutch, MD;  Location: [facility];  Service: Vascular;  Laterality: Right;   PERIPHERAL VASCULAR BALLOON ANGIOPLASTY  09/08/2016   Procedure: Peripheral Vascular Balloon Angioplasty;  Surgeon: Serafina Mitchell, MD;  Location: [facility];  Service: Cardiovascular;;  left avf   PERIPHERAL VASCULAR BALLOON ANGIOPLASTY  09/02/2017   Procedure: PERIPHERAL VASCULAR BALLOON ANGIOPLASTY;  Surgeon: Conrad Bon [place]our, MD;  Location: [facility];  Service: Cardiovascular;;  left AV Graft   PERIPHERAL VASCULAR BALLOON ANGIOPLASTY  12/21/2017   Procedure: PERIPHERAL VASCULAR BALLOON ANGIOPLASTY;  Surgeon: Serafina Mitchell,  MD;  Location: [facility];  Service: Cardiovascular;;  INOMINATE / UPPER ARM AV GRAFT   PERIPHERAL VASCULAR BALLOON ANGIOPLASTY Left 05/12/2018   Procedure: PERIPHERAL VASCULAR BALLOON ANGIOPLASTY;  Surgeon: Marty Heck, MD;  Location: [facility];  Service: Cardiovascular;  Laterality: Left;  Arm fistula   PERIPHERAL VASCULAR BALLOON ANGIOPLASTY Left 10/05/2018   Procedure: PERIPHERAL VASCULAR BALLOON ANGIOPLASTY;  Surgeon: Marty Heck, MD;  Location: [facility];  Service: Cardiovascular;  Laterality: Left;  central and peripheral vein   PERIPHERAL VASCULAR BALLOON ANGIOPLASTY Left 04/18/2019   Procedure: PERIPHERAL VASCULAR BALLOON ANGIOPLASTY;  Surgeon: Serafina Mitchell, MD;  Location: [facility];   Service: Cardiovascular;  Laterality: Left;  arm fistula    TEE WITHOUT CARDIOVERSION N/A 03/06/2016   Procedure: TRANSESOPHAGEAL ECHOCARDIOGRAM (TEE);  Surgeon: Sueanne Margarita, MD;  Location: [facility];  Service: Cardiovascular;  Laterality: N/A;   TUBAL LIGATION  11/1986   Current Outpatient Medications on File Prior to Visit  Medication Sig Dispense Refill   atorvastatin (LIPITOR) 80 MG tablet Take by mouth.     pantoprazole (PROTONIX) 40 MG tablet 1 tablet     tacrolimus ER (ENVARSUS XR) 1 MG TB24 9 Tablet by mouth daily     Accu-Chek Softclix Lancets lancets USE AS DIRECTED TO TEST DAILY 100 each 8   acetaminophen (TYLENOL) 650 MG CR tablet Take 650-1,300 mg by mouth every 8 (eight) hours as needed for pain.     albuterol (PROVENTIL) (2.5 MG/3ML) 0.083% nebulizer solution Take 3 mLs (2.5 mg total) by nebulization every 6 (six) hours as needed for wheezing or shortness of breath. ICD 10:J45.20 75 mL 0   albuterol (VENTOLIN HFA) 108 (90 Base) MCG/ACT inhaler INHALE 2 PUFFS BY MOUTH EVERY 6 HOURS AS NEEDED FOR WHEEZE OR SHORTNESS OF BREATH 54 each 1   allopurinol (ZYLOPRIM) 100 MG tablet 1 tablet     amLODipine (NORVASC) 10 MG tablet TAKE 1 TABLET BY MOUTH EVERYDAY AT BEDTIME 30 tablet 0   amLODipine (NORVASC) 10 MG tablet 1 tablet     aspirin 81 MG EC tablet Take 1 tablet by mouth daily.     atorvastatin (LIPITOR) 40 MG tablet 1 tablet     Blood Glucose Monitoring Suppl (ACCU-CHEK GUIDE) w/Device KIT 1 each by Does not apply route daily. Use as instructed to check blood sugar once daily. E11.22, N18.6, Z99.2 1 kit 0   budesonide-formoterol (SYMBICORT) 160-4.5 MCG/ACT inhaler Inhale 2 puffs into the lungs 2 (two) times daily for 1 day. 3 each 1   carvedilol (COREG) 25 MG tablet Take 1 tablet (25 mg total) by mouth 2 (two) times daily with a meal. 60 tablet 6   chlorpheniramine (CHLOR-TRIMETON) 4 MG tablet Take 1 tablet (4 mg total) by mouth 3 (three) times daily. (Patient taking  differently: Take 8 mg by mouth daily at 12 noon.) 190 tablet 0   Cholecalciferol 25 MCG (1000 UT) capsule Take by mouth.     cinacalcet (SENSIPAR) 90 MG tablet Pt takes 1 90 mg tablet with 2 30 mg tablets. Total of 150MG     clotrimazole (LOTRIMIN) 1 % cream Apply 1 application topically 2 (two) times daily. (Patient not taking: No sig reported) 30 g 1   cyclobenzaprine (FLEXERIL) 10 MG tablet Take 1 tablet (10 mg total) by mouth 2 (two) times daily as needed. For back pain 60 tablet 2   fluticasone (FLONASE) 50 MCG/ACT nasal spray SPRAY 2 SPRAYS INTO  EACH NOSTRIL EVERY DAY 48 mL 1   gabapentin (NEURONTIN) 300 MG capsule 1 capsule     glucose blood (ACCU-CHEK GUIDE) test strip USE TO TEST BLOOD SUGAR LEVELS DAILY 50 strip 1   hydrochlorothiazide (HYDRODIURIL) 12.5 MG tablet Take by mouth.     magnesium oxide (MAG-OX) 400 MG tablet Take 2 tablets by mouth 2 (two) times daily.     meclizine (ANTIVERT) 25 MG tablet Take 25 mg by mouth 3 (three) times daily as needed for dizziness.     multivitamin (RENA-VIT) TABS tablet Take 1 tablet by mouth daily with lunch.      mycophenolate (MYFORTIC) 180 MG EC tablet Take by mouth.     mycophenolate (MYFORTIC) 180 MG EC tablet Take 360 mg by mouth 2 (two) times daily.     nystatin (MYCOSTATIN/NYSTOP) powder Apply 1 application topically 3 (three) times daily. 15 g 0   pantoprazole (PROTONIX) 40 MG tablet TAKE 1 TABLET(40 MG) BY MOUTH TWICE DAILY 30 tablet 2   RESTASIS 0.05 % ophthalmic emulsion 1 drop 2 (two) times daily.     rosuvastatin (CRESTOR) 20 MG tablet TAKE 1 TABLET(20 MG) BY MOUTH DAILY 30 tablet 0   senna-docusate (SENOKOT-S) 8.6-50 MG tablet Take 1 tablet by mouth 2 (two) times daily.     sevelamer carbonate (RENVELA) 800 MG tablet 3 tablets with meals, and 1 with snacks     TRULICITY 1.61 WR/6.0AV SOPN ADMINISTER 0.75 MG UNDER THE SKIN 1 TIME A WEEK 2 mL 2   valGANciclovir (VALCYTE) 450 MG tablet Take by mouth. (Patient not taking: No sig  reported)     VELPHORO 500 MG chewable tablet Chew 500-1,000 mg by mouth See admin instructions. Take 2 tablets (1000 mg) by mouth with each meals & take 1 tablet (500 mg) by mouth with each snack     No current facility-administered medications on file prior to visit.    Allergies  Allergen Reactions   Candesartan Anaphylaxis and Other (See Comments)    Swelling of the mouth and tongue. Other reaction(s): anaphylaxis   Lisinopril Anaphylaxis and Other (See Comments)    Swelling of the mouth and tongue.   Nsaids Anaphylaxis and Other (See Comments)    Swelling of the mouth and tongue.   Metoprolol Tartrate     Other reaction(s): rash   Penicillins Rash and Other (See Comments)    Has patient had a PCN reaction causing immediate rash, facial/tongue/throat swelling, SOB or lightheadedness with hypotension: No Has patient had a PCN reaction causing severe rash involving mucus membranes or skin necrosis: No Has patient had a PCN reaction that required hospitalization No Has patient had a PCN reaction occurring within the last 10 years: No If all of the above answers are "NO", then may proceed with Cephalosporin use.    Social History   Occupational History   Occupation: none  Tobacco Use   Smoking status: Never   Smokeless tobacco: Never  Vaping Use   Vaping Use: Never used  Substance and Sexual Activity   Alcohol use: Yes    Alcohol/week: 0.0 standard drinks    Comment: occ - 1 drink rarely   Drug use: No   Sexual activity: Not Currently   Family History  Problem Relation Age of Onset   Hypertension Mother    Breast cancer Maternal Aunt    Deep vein thrombosis Daughter    Hyperlipidemia Daughter    Hypertension Daughter    Prostate cancer Maternal Grandmother  Immunization History  Administered Date(s) Administered   Influenza Split 11/22/2014   Influenza, Seasonal, Injecte, Preservative Fre 11/24/2016   Influenza,inj,Quad PF,6+ Mos 12/07/2012, 12/25/2013,  11/23/2017   PPD Test 03/13/2015   Pneumococcal Polysaccharide-23 12/25/2013   Tdap 07/21/2012     Review of Systems: Negative except as noted in the HPI.   Objective: There were no vitals filed for this visit.  Rachael George is a pleasant 65 y.o. female in NAD. AAO X 3.  Vascular Examination: CFT immediate b/l LE. Palpable DP/PT pulses b/l LE. Digital hair absent b/l. Skin temperature gradient WNL b/l. No pain with calf compression b/l. No edema noted b/l. No cyanosis or clubbing noted b/l LE.  Dermatological Examination: Pedal integument with normal turgor, texture and tone b/l LE. No open wounds b/l. No interdigital macerations b/l. Toenails 1-5 b/l elongated, thickened, discolored with subungual debris. +Tenderness with dorsal palpation of nailplates. No hyperkeratotic or porokeratotic lesions present.  Musculoskeletal Examination: Muscle strength 5/5 to all lower extremity muscle groups bilaterally. HAV with bunion deformity noted b/l LE. Wearing appropriate fitting shoe gear.  Footwear Assessment: Does the patient wear appropriate shoes? Yes. Does the patient need inserts/orthotics? Yes.  Neurological Examination: Protective sensation intact 5/5 intact bilaterally with 10g monofilament b/l. Vibratory sensation intact b/l.  Hemoglobin A1C Latest Ref Rng & Units 06/17/2020  HGBA1C 0.0 - 7.0 % 6.7  Some recent data might be hidden   Assessment: 1. Pain due to onychomycosis of toenails of both feet   2. Hallux valgus, acquired, bilateral   3. Kidney transplant recipient   4. Type 2 diabetes mellitus with other diabetic kidney complication, without long-term current use of insulin ([place])   5. Encounter for diabetic foot exam ([place])     ADA Risk Categorization: Low Risk :  Patient has all of the following: Intact protective sensation No prior foot ulcer  No severe deformity Pedal pulses present  Plan: -Examined patient. -Recommended patient purchase New Balance  Sneakers 600 series or higher. Will start process for diabetic shoes for her. -Diabetic foot examination performed today. -Continue diabetic foot care principles: inspect feet daily, monitor glucose as recommended by PCP and/or Endocrinologist, and follow prescribed diet per PCP, Endocrinologist and/or dietician. -Patient to continue soft, supportive shoe gear daily. Start procedure for diabetic shoes. Patient qualifies based on diagnoses. -Toenails 1-5 b/l were debrided in length and girth with sterile nail nippers and dremel without iatrogenic bleeding.  -Patient/POA to call should there be question/concern in the interim.  Return in about 3 months (around 05/10/2021).  Marzetta Board, DPM

## 2021-02-07 NOTE — Patient Instructions (Addendum)
Purchase a pair of [employer], 600 series or higher.  [email or web address]  Call after Thanksgiving and schedule an appointment in January to be measured for your diabetic shoes.  Diabetes Mellitus and Foot Care Foot care is an important part of your health, especially when you have diabetes. Diabetes may cause you to have problems because of poor blood flow (circulation) to your feet and legs, which can cause your skin to: Become thinner and drier. Break more easily. Heal more slowly. Peel and crack. You may also have nerve damage (neuropathy) in your legs and feet, causing decreased feeling in them. This means that you may not notice minor injuries to your feet that could lead to more serious problems. Noticing and addressing any potential problems early is the best way to prevent future foot problems. How to care for your feet Foot hygiene  Wash your feet daily with warm water and mild soap. Do not use hot water. Then, pat your feet and the areas between your toes until they are completely dry. Do not soak your feet as this can dry your skin. Trim your toenails straight across. Do not dig under them or around the cuticle. File the edges of your nails with an emery board or nail file. Apply a moisturizing lotion or petroleum jelly to the skin on your feet and to dry, brittle toenails. Use lotion that does not contain alcohol and is unscented. Do not apply lotion between your toes. Shoes and socks Wear clean socks or stockings every day. Make sure they are not too tight. Do not wear knee-high stockings since they may decrease blood flow to your legs. Wear shoes that fit properly and have enough cushioning. Always look in your shoes before you put them on to be sure there are no objects inside. To break in new shoes, wear them for just a few hours a day. This prevents injuries on your feet. Wounds, scrapes, corns, and calluses  Check your feet daily for blisters, cuts,  bruises, sores, and redness. If you cannot see the bottom of your feet, use a mirror or ask someone for help. Do not cut corns or calluses or try to remove them with medicine. If you find a minor scrape, cut, or break in the skin on your feet, keep it and the skin around it clean and dry. You may clean these areas with mild soap and water. Do not clean the area with peroxide, alcohol, or iodine. If you have a wound, scrape, corn, or callus on your foot, look at it several times a day to make sure it is healing and not infected. Check for: Redness, swelling, or pain. Fluid or blood. Warmth. Pus or a bad smell. General tips Do not cross your legs. This may decrease blood flow to your feet. Do not use heating pads or hot water bottles on your feet. They may burn your skin. If you have lost feeling in your feet or legs, you may not know this is happening until it is too late. Protect your feet from hot and cold by wearing shoes, such as at the beach or on hot pavement. Schedule a complete foot exam at least once a year (annually) or more often if you have foot problems. Report any cuts, sores, or bruises to your health care provider immediately. Where to find more information American Diabetes Association: www.diabetes.org [employer] Education [employer]ists: www.diabeteseducator.org Contact a health care provider if: You have a medical condition that increases your  risk of infection and you have any cuts, sores, or bruises on your feet. You have an injury that is not healing. You have redness on your legs or feet. You feel burning or tingling in your legs or feet. You have pain or cramps in your legs and feet. Your legs or feet are numb. Your feet always feel cold. You have pain around any toenails. Get help right away if: You have a wound, scrape, corn, or callus on your foot and: You have pain, swelling, or redness that gets worse. You have fluid or blood coming from the  wound, scrape, corn, or callus. Your wound, scrape, corn, or callus feels warm to the touch. You have pus or a bad smell coming from the wound, scrape, corn, or callus. You have a fever. You have a red line going up your leg. Summary Check your feet every day for blisters, cuts, bruises, sores, and redness. Apply a moisturizing lotion or petroleum jelly to the skin on your feet and to dry, brittle toenails. Wear shoes that fit properly and have enough cushioning. If you have foot problems, report any cuts, sores, or bruises to your health care provider immediately. Schedule a complete foot exam at least once a year (annually) or more often if you have foot problems. This information is not intended to replace advice given to you by your health care provider. Make sure you discuss any questions you have with your health care provider. Document Revised: 09/28/2019 Document Reviewed: 09/28/2019 Else[employer] Patient Education  [place] A bunion (hallux valgus) is a bump that forms slowly on the inner side of the big toe joint. It occurs when the big toe turns toward the second toe. Bunions may be small at first, but they often get larger over time. They can make walking painful. What are the causes? This condition may be caused by: Wearing narrow or pointed shoes that force the big toe to press against the other toes. Abnormal foot development that causes the foot to roll inward. Changes in the foot that are caused by certain diseases, such as rheumatoid arthritis or polio. A foot injury. What increases the risk? The following factors may make you more likely to develop this condition: Wearing shoes that squeeze the toes together. Having certain diseases, such as: Rheumatoid arthritis. Polio. Cerebral palsy. Having family members who have bunions. Being born with abnormally shaped feet (a foot deformity), such as flat feet or low arches. Doing activities that put a lot of  pressure on the feet, such as ballet dancing. What are the signs or symptoms? The main symptom of this condition is a bump on your big toe that you can notice. Other symptoms may include: Pain. Redness and inflammation around your big toe. Thick or hardened skin on your big toe or between your toes. Stiffness or loss of motion in your big toe. Trouble with walking. How is this diagnosed? This condition may be diagnosed based on your symptoms, medical history, and activities. You may also have tests and imaging, such as: X-rays. These allow your health care provider to check the position of the bones in your foot and look for damage to your joint. They also help your health care provider determine the severity of your bunion and the best way to treat it. Joint aspiration. In this test, a sample of fluid is removed from the toe joint. This test may be done if you are in a lot of pain. It helps rule  out diseases that cause painful swelling of the joints, such as arthritis or gout. How is this treated? Treatment depends on the severity of your symptoms. The goal of treatment is to relieve symptoms and prevent your bunion from getting worse. Your health care provider may recommend: Wearing shoes that have a wide toe box, or using bunion pads to cushion the affected area. Taping your toes together to keep them in a normal position. Placing a device inside your shoe (orthotic device) to help reduce pressure on your toe joint. Taking medicine to ease pain and inflammation. Putting ice or heat on the affected area. Doing stretching exercises. Surgery, for severe cases. Follow these instructions at home: Managing pain, stiffness, and swelling   If directed, put ice on the painful area. To do this: Put ice in a plastic bag. Place a towel between your skin and the bag. Leave the ice on for 20 minutes, 2-3 times a day. Remove the ice if your skin turns bright red. This is very important. If you  cannot feel pain, heat, or cold, you have a greater risk of damage to the area. If directed, apply heat to the affected area before you exercise. Use the heat source that your health care provider recommends, such as a moist heat pack or a heating pad. Place a towel between your skin and the heat source. Leave the heat on for 20-30 minutes. Remove the heat if your skin turns bright red. This is especially important if you are unable to feel pain, heat, or cold. You have a greater risk of getting burned. General instructions Do exercises as told by your health care provider. Support your toe joint with proper footwear, shoe padding, or taping as told by your health care provider. Take over-the-counter and prescription medicines only as told by your health care provider. Do not use any products that contain nicotine or tobacco, such as cigarettes, e-cigarettes, and chewing tobacco. If you need help quitting, ask your health care provider. Keep all follow-up visits. This is important. Contact a health care provider if: Your symptoms get worse. Your symptoms do not improve in 2 weeks. Get help right away if: You have severe pain and trouble with walking. Summary A bunion is a bump on the inner side of the big toe joint that forms when the big toe turns toward the second toe. Bunions can make walking painful. Treatment depends on the severity of your symptoms. Support your toe joint with proper footwear, shoe padding, or taping as told by your health care provider. This information is not intended to replace advice given to you by your health care provider. Make sure you discuss any questions you have with your health care provider. Document Revised: 07/14/2019 Document Reviewed: 07/14/2019 Elsevier Patient Education  2022 [employer].

## 2021-02-20 ENCOUNTER — Other Ambulatory Visit: Payer: Self-pay | Admitting: Family Medicine

## 2021-02-20 DIAGNOSIS — M546 Pain in thoracic spine: Secondary | ICD-10-CM

## 2021-03-05 ENCOUNTER — Ambulatory Visit (HOSPITAL_COMMUNITY)
Admission: RE | Admit: 2021-03-05 | Discharge: 2021-03-05 | Disposition: A | Discharge status: Home / Self Care | Payer: Medicare Other | Source: Ambulatory Visit | Attending: Family Medicine | Admitting: Family Medicine

## 2021-03-05 ENCOUNTER — Other Ambulatory Visit: Payer: Self-pay

## 2021-03-05 DIAGNOSIS — M546 Pain in thoracic spine: Secondary | ICD-10-CM | POA: Insufficient documentation

## 2021-03-18 ENCOUNTER — Other Ambulatory Visit: Payer: Self-pay | Admitting: *Deleted

## 2021-03-18 NOTE — Patient Outreach (Signed)
[facility], [place]) Care Management  03/18/2021  Rachael George 11-19-1955 [id number]  [facility]) Care Management RN Health Coach Note   03/18/2021 Name:  Rachael George MRN:  [id number] DOB:  1956-01-30  Summary: Patient reports that she is feeling good. Patient states that her A1c decreased to 5.7, her FBS ranges are 100-110, and her postprandial blood sugar ranges are 130-150. Patient goals are to increase physical activity, keep diabetes controlled, and lose weight. Patient reports eating healthier and states she walks routinely. Discussed using smart TV and You-Tube senior exercises/chair exercises.  Recommendations/Changes made from today's visit: Continue to limit carbohydrates and sugar in your diet Use smart TV to go to You-[employer] enter senior exercise/ chair exercise Continue to walk or do chair exercises 3 times a week for about 30 minutes Schedule a dental appointment  Subjective: Rachael George is an 65 y.o. year old female who is a primary patient of Charlott Rakes, MD. The care management team was consulted for assistance with care management and/or care coordination needs.    RN Health Coach completed Telephone Visit today.   Objective:  Medications Reviewed Today     Reviewed by Michiel Cowboy, RN (Registered Nurse) on 03/18/21 at [place] List Status: <None>   Medication Order Taking? Sig Documenting Provider Last Dose Status Informant  Accu-Chek Softclix Lancets lancets [id number] Yes USE AS DIRECTED TO TEST DAILY Charlott Rakes, MD Taking Active   acetaminophen (TYLENOL) 650 MG CR tablet [id number] Yes Take 650-1,300 mg by mouth every 8 (eight) hours as needed for pain. [provider] Taking Active Self  albuterol (PROVENTIL) (2.5 MG/3ML) 0.083% nebulizer solution [id number] Yes Take 3 mLs (2.5 mg total) by nebulization every 6 (six) hours as needed for wheezing or shortness of breath. ICD 10:J45.20 Charlott Rakes, MD  Taking Active   albuterol (VENTOLIN HFA) 108 (90 Base) MCG/ACT inhaler [id number] Yes INHALE 2 PUFFS BY MOUTH EVERY 6 HOURS AS NEEDED FOR WHEEZE OR SHORTNESS OF Red Christians, Enobong, MD Taking Active   allopurinol (ZYLOPRIM) 100 MG tablet [id number] Yes 1 tablet [provider] Taking Active   amLODipine (NORVASC) 10 MG tablet [id number] Yes TAKE 1 TABLET BY MOUTH EVERYDAY AT BEDTIME Charlott Rakes, MD Taking Active            Med Note Laretta Alstrom, Jakie Debow A   Tue Mar 18, 2021 10:54 AM) Per pt. 5 mg  amLODipine (NORVASC) 10 MG tablet [id number] No 1 tablet  Patient not taking: Reported on 03/18/2021   [provider] Not Taking Active            Med Note ([place] Mar 19, 1883 16:60 AM) duplicate  aspirin 81 MG EC tablet [id number] Yes Take 1 tablet by mouth daily. [provider] Taking Active   atorvastatin (LIPITOR) 40 MG tablet [id number] No 1 tablet  Patient not taking: Reported on 03/18/2021   [provider] Not Taking Active            Med Note Laretta Alstrom, Kania Regnier A   Tue Mar 18, 253 27:06 AM) duplicate  atorvastatin (LIPITOR) 80 MG tablet [id number] Yes Take by mouth. [provider] Taking Active   Blood Glucose Monitoring Suppl (ACCU-CHEK GUIDE) w/Device KIT [id number] Yes 1 each by Does not apply route daily. Use as instructed to check blood sugar once daily. E11.22, N18.6, Z99.2 Charlott Rakes, MD Taking Active   budesonide-formoterol [facility]) 160-4.5 MCG/ACT inhaler [id number]  Inhale 2  puffs into the lungs 2 (two) times daily for 1 day. Charlott Rakes, MD  Expired 12/20/19 2359   carvedilol (COREG) 25 MG tablet [id number] Yes Take 1 tablet (25 mg total) by mouth 2 (two) times daily with a meal. Charlott Rakes, MD Taking Active            Med Note Laretta Alstrom, Fabian Walder A   Mon Dec 18, 2019 11:11 AM) Per pt. 12.5 mg  chlorpheniramine (CHLOR-TRIMETON) 4 MG tablet [id number] Yes Take 1 tablet (4 mg total) by mouth 3 (three) times daily.  Patient taking  differently: Take 8 mg by mouth daily at 12 noon.   Marshell Garfinkel, MD Taking Active   Cholecalciferol 25 MCG (1000 UT) capsule [id number] Yes Take by mouth. [provider] Taking Active   cinacalcet (SENSIPAR) 90 MG tablet [id number] Yes Pt takes 1 90 mg tablet with 2 30 mg tablets. Total of 150MG [provider] Taking Active   clotrimazole (LOTRIMIN) 1 % cream [id number] Yes Apply 1 application topically 2 (two) times daily. Charlott Rakes, MD Taking Active            Med Note Laretta Alstrom, Zell Hylton A   Wed Oct 16, 2020 11:50 AM) completed  cyclobenzaprine (FLEXERIL) 10 MG tablet [id number] Yes Take 1 tablet (10 mg total) by mouth 2 (two) times daily as needed. For back pain Charlott Rakes, MD Taking Active   fluticasone (FLONASE) 50 MCG/ACT nasal spray [id number] Yes SPRAY 2 SPRAYS INTO EACH NOSTRIL EVERY DAY Newlin, Enobong, MD Taking Active   gabapentin (NEURONTIN) 300 MG capsule [id number] Yes 1 capsule [provider] Taking Active   glucose blood (ACCU-CHEK GUIDE) test strip [id number] Yes USE TO TEST BLOOD SUGAR LEVELS DAILY Charlott Rakes, MD Taking Active   hydrochlorothiazide (HYDRODIURIL) 12.5 MG tablet [id number] Yes Take by mouth. [provider] Taking Active   magnesium oxide (MAG-OX) 400 MG tablet [id number] Yes Take 2 tablets by mouth 2 (two) times daily. [provider] Taking Active   meclizine (ANTIVERT) 25 MG tablet [id number] Yes Take 25 mg by mouth 3 (three) times daily as needed for dizziness. [provider] Taking Active   multivitamin (RENA-VIT) TABS tablet [id number] Yes Take 1 tablet by mouth daily with lunch.  [provider] Taking Active Self  mycophenolate (MYFORTIC) 180 MG EC tablet [id number] Yes Take by mouth. [provider] Taking Active   mycophenolate (MYFORTIC) 180 MG EC tablet [id number] Yes Take 360 mg by mouth 2 (two) times daily. [provider] Taking Active   nystatin (MYCOSTATIN/NYSTOP)  powder [id number] No Apply 1 application topically 3 (three) times daily.  Patient not taking: Reported on 03/18/2021   Charlott Rakes, MD Not Taking Active            Med Note Laretta Alstrom, Keedan Sample A   Tue Mar 18, 2021 10:59 AM) completed  pantoprazole (PROTONIX) 40 MG tablet [id number] Yes TAKE 1 TABLET(40 MG) BY MOUTH TWICE DAILY Newlin, Enobong, MD Taking Active   pantoprazole (PROTONIX) 40 MG tablet [id number] Yes 1 tablet [provider] Taking Active   RESTASIS 0.05 % ophthalmic emulsion [id number] Yes 1 drop 2 (two) times daily. [provider] Taking Active   rosuvastatin (CRESTOR) 20 MG tablet [id number] Yes TAKE 1 TABLET(20 MG) BY MOUTH DAILY Newlin, Enobong, MD Taking Active   senna-docusate (SENOKOT-S) 8.6-50 MG tablet [id number] Yes Take 1 tablet by mouth 2 (two) times daily. [provider] Taking Active   sevelamer carbonate (RENVELA) 800 MG tablet [id number] Yes 3  tablets with meals, and 1 with snacks [provider] Taking Active   tacrolimus ER (ENVARSUS XR) 1 MG TB24 [id number] Yes 9 Tablet by mouth daily [provider] Taking Active   TRULICITY 8.58 IF/0.2DX SOPN [id number] Yes ADMINISTER 0.75 MG UNDER THE SKIN 1 TIME A WEEK Newlin, Enobong, MD Taking Active   valGANciclovir (VALCYTE) 450 MG tablet [id number] No Take by mouth.  Patient not taking: Reported on 07/23/2020   [provider] Not Taking Active            Med Note Laretta Alstrom, Aretta Stetzel A   Wed Oct 16, 2020 11:51 AM) completed  VELPHORO 500 MG chewable tablet [id number] Yes Chew 500-1,000 mg by mouth See admin instructions. Take 2 tablets (1000 mg) by mouth with each meals & take 1 tablet (500 mg) by mouth with each snack [provider] Taking Active              SDOH:  (Social Determinants of Health) assessments and interventions performed: SDOH assessments completed today and documented in the Epic system.    Care Plan  Review of patient past medical history,  allergies, medications, health status, including review of consultants reports, laboratory and other test data, was performed as part of comprehensive evaluation for care management services.   Care Plan : Diabetes Type 2 (Adult)  Updates made by Michiel Cowboy, RN since 03/18/2021 12:00 AM     Problem: Glycemic Management (Diabetes, Type 2) Resolved 03/18/2021  Priority: High     Long-Range Goal: Glycemic Management Optimized Completed 03/18/2021  Start Date: 03/04/2020  Expected End Date: 03/04/2021  Note:   Resolving due to duplicate goal  Evidence-based guidance:  Anticipate A1C testing (point-of-care) every 3 to 6 months based on goal attainment.  Review mutually-set A1C goal or target range.  Anticipate use of antihyperglycemic with or without insulin and periodic adjustments; consider active involvement of pharmacist.  Provide medical nutrition therapy and development of individualized eating.  Compare self-reported symptoms of hypo or hyperglycemia to blood glucose levels, diet and fluid intake, current medications, psychosocial and physiologic stressors, change in activity and barriers to care adherence.  Promote self-monitoring of blood glucose levels.  Assess and address barriers to management plan, such as food insecurity, age, developmental ability, depression, anxiety, fear of hypoglycemia or weight gain, as well as medication cost, side effects and complicated regimen.  Consider referral to community-based diabetes education program, visiting nurse, community health worker or health coach.  Encourage regular dental care for treatment of periodontal disease; refer to dental provider when needed.   Notes:     Task: Alleviate Barriers to Glycemic Management Completed 03/18/2021  Due Date: 03/21/2021  Note:   Care Management Activities:    - blood glucose monitoring encouraged - blood glucose readings reviewed - individualized medical nutrition therapy provided -  self-awareness of signs/symptoms of hypo or hyperglycemia encouraged - use of blood glucose monitoring log promoted    Notes:     Problem: Disease Progression (Diabetes, Type 2) Resolved 03/18/2021  Priority: High     Long-Range Goal: Disease Progression Prevented or Minimized Completed 03/18/2021  Start Date: 03/04/2020  Expected End Date: 03/04/2021  Note:   Resolving due to duplicate goal  Evidence-based guidance:  Prepare patient for laboratory and diagnostic exams based on risk and presentation.  Encourage lifestyle changes, such as increased intake of plant-based foods, stress reduction, consistent physical activity and smoking cessation to prevent long-term complications and chronic disease.   Individualize activity and  exercise recommendations while considering potential limitations, such as neuropathy, retinopathy or the ability to prevent hyperglycemia or hypoglycemia.   Prepare patient for use of pharmacologic therapy that may include antihypertensive, analgesic, prostaglandin E1 with periodic adjustments, based on presenting chronic condition and laboratory results.  Assess signs/symptoms and risk factors for hypertension, sleep-disordered breathing, neuropathy (including changes in gait and balance), retinopathy, nephropathy and sexual dysfunction.  Address pregnancy planning and contraceptive choice, especially when prescribing antihypertensive or statin.  Ensure completion of annual comprehensive foot exam and dilated eye exam.   Implement additional individualized goals and interventions based on identified risk factors.  Prepare patient for consultation or referral for specialist care, such as ophthalmology, neurology, cardiology, podiatry, nephrology or perinatology.   Notes:     Task: Monitor and Manage Follow-up for Comorbidities Completed 03/18/2021  Due Date: 03/21/2021  Note:   Care Management Activities:    - activity based on tolerance and functional  limitations encouraged - completion of annual dilated eye exam confirmed - completion of annual foot exam verified - healthy lifestyle promoted - medication side effects managed - modest weight loss (5 percent) promoted - quality of sleep assessed - reduction of sedentary activity encouraged - signs/symptoms of comorbidities identified - vital signs and trends reviewed    Notes:     Care Plan : RN Care Manager Plan of Care  Updates made by Michiel Cowboy, RN since 03/18/2021 12:00 AM     Problem: Knowledge Deficit Related to Diabetes   Priority: High     Long-Range Goal: Development of Plan of Care for Management of Diabetes   Start Date: 03/18/2021  Expected End Date: 03/21/2022  Priority: High  Note:   Current Barriers:  Chronic Disease Management support and education needs related to DMII   RNCM Clinical Goal(s):  Patient will demonstrate Ongoing adherence to prescribed treatment plan for DMII as evidenced by continuation of maintaining A1c below 7  through collaboration with RN Care manager, provider, and care team.   Interventions: Inter-disciplinary care team collaboration (see longitudinal plan of care) Evaluation of current treatment plan related to  self management and patient's adherence to plan as established by provider   Diabetes Interventions:  (Status:  Goal on track:  Yes.) Long Term Goal Assessed patient's understanding of A1c goal: <7% Provided education to patient about basic DM disease process Reviewed medications with patient and discussed importance of medication adherence Discussed plans with patient for ongoing care management follow up and provided patient with direct contact information for care management team Advised patient, providing education and rationale, to check cbg 1-2 times daily and record, calling PCP for findings outside established parameters Adhere to a low carbohydrate and low sugar diet Increase physical activity by walking or  doing chair exercise 3 times a week Lab Results  Component Value Date   HGBA1C 6.7 06/17/2020   Patient Goals/Self-Care Activities: Take all medications as prescribed Attend all scheduled provider appointments Call pharmacy for medication refills 3-7 days in advance of running out of medications keep appointment with eye doctor check blood sugar at prescribed times: twice daily check feet daily for cuts, sores or redness enter blood sugar readings and medication or insulin into daily log take the blood sugar log to all doctor visits set goal weight trim toenails straight across fill half of plate with vegetables manage portion size set a realistic goal wear comfortable, well-fitting shoes Continue to limit carbohydrates and sugar in your diet Use smart TV to go to [employer]  enter senior exercise/ chair exercise Continue to walk or do chair exercises 3 times a week for about 30 minutes Schedule a dental appointment  Follow Up Plan:  Telephone follow up appointment with care management team member scheduled for:  March       Plan: Telephone follow up appointment with care management team member scheduled for:  March. Nurse will send PCP today's assessment note.  Emelia Loron RN, [place] [phone] [email or web address]

## 2021-03-18 NOTE — Patient Instructions (Signed)
Visit Information  Thank you for taking time to visit with me today. Please don't hesitate to contact me if I can be of assistance to you before our next scheduled telephone appointment.  Following are the goals we discussed today:  Patient Goals/Self-Care Activities: Take all medications as prescribed Attend all scheduled provider appointments Call pharmacy for medication refills 3-7 days in advance of running out of medications keep appointment with eye doctor check blood sugar at prescribed times: twice daily check feet daily for cuts, sores or redness enter blood sugar readings and medication or insulin into daily log take the blood sugar log to all doctor visits set goal weight trim toenails straight across fill half of plate with vegetables manage portion size set a realistic goal wear comfortable, well-fitting shoes Continue to limit carbohydrates and sugar in your diet Use smart TV to go to [employer] enter senior exercise/ chair exercise Continue to walk or do chair exercises 3 times a week for about 30 minutes Schedule a dental appointment  Follow Up Plan:  Telephone follow up appointment with care management team member scheduled for:  March  Patient verbalizes understanding of instructions provided today and agrees to view in [place].   Telephone follow up appointment with care management team member scheduled TYO:MAYOK  Emelia Loron RN, [place] [phone] [email or web address]

## 2021-03-26 ENCOUNTER — Other Ambulatory Visit: Payer: Medicare Other

## 2021-03-28 ENCOUNTER — Other Ambulatory Visit: Payer: Self-pay | Admitting: Family Medicine

## 2021-03-28 DIAGNOSIS — E1122 Type 2 diabetes mellitus with diabetic chronic kidney disease: Secondary | ICD-10-CM

## 2021-03-28 NOTE — Telephone Encounter (Signed)
Requested medication (s) are due for refill today: yes  Requested medication (s) are on the active medication list: yes  Last refill:  12/02/20 #1ml/2RF  Future visit scheduled: No  Notes to clinic:  Unable to refill per protocol due to failed labs, no updated results., pt needs appt as well     Requested Prescriptions  Pending Prescriptions Disp Refills   TRULICITY 6.46 OE/3.2ZY SOPN [Pharmacy Med Name: TRULICITY 0.75MG /0.5ML SDP 0.5ML] 2 mL 2    Sig: ADMINISTER 0.75 MG UNDER THE SKIN 1 TIME A WEEK     Endocrinology:  Diabetes - GLP-1 Receptor Agonists Failed - 03/28/2021  3:21 AM      Failed - HBA1C is between 0 and 7.9 and within 180 days    HbA1c, POC (controlled diabetic range)  Date Value Ref Range Status  06/17/2020 6.7 0.0 - 7.0 % Final          Failed - Valid encounter within last 6 months    Recent Outpatient Visits           8 months ago Localized swelling, mass and lump, neck   [place], [place], MD   9 months ago Type 2 diabetes mellitus with stage 3a chronic kidney disease, without long-term current use of insulin ([place])   [place], [place], MD   1 year ago Type 2 diabetes mellitus with chronic kidney disease on chronic dialysis, without long-term current use of insulin ([place])   [place], [place], MD   1 year ago Type 2 diabetes mellitus with chronic kidney disease on chronic dialysis, without long-term current use of insulin ([place])   [facility] Charlott Rakes, MD   2 years ago End stage renal disease [facility])   [facility] Charlott Rakes, MD

## 2021-03-31 ENCOUNTER — Telehealth: Payer: Self-pay | Admitting: Podiatry

## 2021-03-31 ENCOUNTER — Ambulatory Visit: Payer: Commercial Managed Care - HMO

## 2021-03-31 ENCOUNTER — Other Ambulatory Visit: Payer: Self-pay

## 2021-03-31 DIAGNOSIS — E1129 Type 2 diabetes mellitus with other diabetic kidney complication: Secondary | ICD-10-CM

## 2021-03-31 DIAGNOSIS — M2012 Hallux valgus (acquired), left foot: Secondary | ICD-10-CM

## 2021-03-31 NOTE — Telephone Encounter (Signed)
Pt called with questions about her diabetic shoes that she was measured for today. She asked to speak to Aaron Edelman but he was with pts so I asked if I could help her and she had questions about billing. She said she was told she would have to pay 179.00 dollars for the shoes and inserts and she cannot afford that. Pt has Clinical cytogeneticist and medicaid. I explained that medicare covers 80% but it is the beginning of the year so she may have a deductible as well but medicaid does not cover dme in our office. She stated her daughter was calling the insurance as well. I explained to pt that we do not bill the insurance until pt picks up the shoes and that we can do payment plans if needed. She said her daughter was going to pay tor the shoes. She said she was not aware of the payment plan and could do that.

## 2021-03-31 NOTE — Telephone Encounter (Signed)
Received call from Emelia Loron @ [facility] and she was asking about pts diabetic shoes. She stated pt called her about it.She said pt was told she would have to pay 179.00 dollars for the shoes and inserts and she cannot afford that. Pt has Clinical cytogeneticist and medicaid. I explained that medicare covers 80% but it is the beginning of the year so she may have a deductible as well but medicaid does not cover dme in our office.

## 2021-03-31 NOTE — Progress Notes (Signed)
SITUATION Reason for Consult: Evaluation for Prefabricated Diabetic Shoes and Bilateral Custom Diabetic Inserts. Patient / Caregiver Report: Patient   OBJECTIVE DATA: Patient History / Diagnosis:    ICD-10-CM   1. Type 2 diabetes mellitus with other diabetic kidney complication, without long-term current use of insulin (HCC)  E11.29     2. Hallux valgus, acquired, bilateral  M20.11    M20.12       Presence of Diabetic Complications: - Peripheral Neuropathy  Current or Previous Devices: None and no history  In-Person Foot Examination:  Skin presentation:   Thin, Shiny, Hairless Nail presentation:   Thick, Ingrown, With Fungus Ulcers & Callousing:   None  Toe / Foot Deformities:   - Pes Planus  - Hindfoot Valgus - Forefoot ABduction - Hammertoes  Shoe Size: 8.13M  ORTHOTIC RECOMMENDATION Recommended Devices: - 1x pair prefabricated PDAC approved diabetic shoes: [id number] 8.13M - 3x pair custom-to-patient vacuum formed diabetic insoles.   GOALS OF SHOES AND INSOLES - Reduce shear and pressure - Reduce / Prevent callus formation - Reduce / Prevent ulceration - Protect the fragile healing compromised diabetic foot.  Patient would benefit from diabetic shoes and inserts as patient has diabetes mellitus and the patient has one or more of the following conditions: - Peripheral neuropathy with evidence of callus formation - Foot deformity - Poor circulation  ACTIONS PERFORMED Patient was casted for insoles via crush box and measured for shoes via brannock device. Procedure was explained and patient tolerated procedure well. All questions were answered and concerns addressed.  PLAN Insurance to be verified and out of pocket cost communicated to patient. Once cost verified and agreed upon and diabetic certification received, casts are to be sent to [facility] for fabrication. Patient is to be called for fitting when devices are ready.

## 2021-04-08 ENCOUNTER — Other Ambulatory Visit: Payer: Self-pay | Admitting: Family Medicine

## 2021-04-08 DIAGNOSIS — Z1231 Encounter for screening mammogram for malignant neoplasm of breast: Secondary | ICD-10-CM

## 2021-04-30 ENCOUNTER — Other Ambulatory Visit: Payer: Self-pay

## 2021-04-30 ENCOUNTER — Ambulatory Visit (INDEPENDENT_AMBULATORY_CARE_PROVIDER_SITE_OTHER): Payer: Medicare Other

## 2021-04-30 DIAGNOSIS — M2011 Hallux valgus (acquired), right foot: Secondary | ICD-10-CM | POA: Diagnosis not present

## 2021-04-30 DIAGNOSIS — M2012 Hallux valgus (acquired), left foot: Secondary | ICD-10-CM | POA: Diagnosis not present

## 2021-04-30 DIAGNOSIS — E1129 Type 2 diabetes mellitus with other diabetic kidney complication: Secondary | ICD-10-CM

## 2021-04-30 NOTE — Progress Notes (Signed)
SITUATION Reason for Visit: Fitting of Diabetic Shoes & Insoles Patient / Caregiver Report:  Patient reports comfort and is satisfied  OBJECTIVE DATA: Patient History / Diagnosis:     ICD-10-CM   1. Type 2 diabetes mellitus with other diabetic kidney complication, without long-term current use of insulin (HCC)  E11.29     2. Hallux valgus, acquired, bilateral  M20.11    M20.12       Change in Status:   None  ACTIONS PERFORMED: In-Person Delivery, patient was fit with: - 1x pair [id number] PDAC approved prefabricated Diabetic Shoes: Apex [id number] 9W - 3x pair [id number] PDAC approved CAM milled custom diabetic insoles - [employer]: [id number]  Shoes and insoles were verified for structural integrity and safety. Patient wore shoes and insoles in office. Skin was inspected and free of areas of concern after wearing shoes and inserts. Shoes and inserts fit properly. Patient / Caregiver provided with ferbal instruction and demonstration regarding donning, doffing, wear, care, proper fit, function, purpose, cleaning, and use of shoes and insoles ' and in all related precautions and risks and benefits regarding shoes and insoles. Patient / Caregiver was instructed to wear properly fitting socks with shoes at all times. Patient was also provided with verbal instruction regarding how to report any failures or malfunctions of shoes or inserts, and necessary follow up care. Patient / Caregiver was also instructed to contact physician regarding change in status that may affect function of shoes and inserts.   Patient / Caregiver verbalized undersatnding of instruction provided. Patient / Caregiver demonstrated independence with proper donning and doffing of shoes and inserts.  PLAN Patient to follow up as needed. Plan of care was discussed with and agreed upon by patient and/or caregiver. All questions were answered and concerns addressed.

## 2021-05-19 ENCOUNTER — Ambulatory Visit: Payer: Medicare Other | Admitting: Podiatry

## 2021-05-21 ENCOUNTER — Ambulatory Visit
Admission: RE | Admit: 2021-05-21 | Discharge: 2021-05-21 | Disposition: A | Discharge status: Home / Self Care | Payer: Medicare Other | Source: Ambulatory Visit | Attending: Family Medicine | Admitting: Family Medicine

## 2021-05-21 DIAGNOSIS — Z1231 Encounter for screening mammogram for malignant neoplasm of breast: Secondary | ICD-10-CM

## 2021-05-28 ENCOUNTER — Other Ambulatory Visit: Payer: Self-pay | Admitting: Family Medicine

## 2021-05-28 DIAGNOSIS — M545 Low back pain, unspecified: Secondary | ICD-10-CM

## 2021-05-30 ENCOUNTER — Encounter: Payer: Self-pay | Admitting: Podiatry

## 2021-05-30 ENCOUNTER — Ambulatory Visit (INDEPENDENT_AMBULATORY_CARE_PROVIDER_SITE_OTHER): Payer: Medicare Other | Admitting: Podiatry

## 2021-05-30 ENCOUNTER — Ambulatory Visit (HOSPITAL_COMMUNITY)
Admission: RE | Admit: 2021-05-30 | Discharge: 2021-05-30 | Disposition: A | Discharge status: Home / Self Care | Payer: Medicare Other | Source: Ambulatory Visit | Attending: Family Medicine | Admitting: Family Medicine

## 2021-05-30 ENCOUNTER — Other Ambulatory Visit: Payer: Self-pay

## 2021-05-30 DIAGNOSIS — M79674 Pain in right toe(s): Secondary | ICD-10-CM | POA: Diagnosis not present

## 2021-05-30 DIAGNOSIS — B351 Tinea unguium: Secondary | ICD-10-CM

## 2021-05-30 DIAGNOSIS — E1129 Type 2 diabetes mellitus with other diabetic kidney complication: Secondary | ICD-10-CM

## 2021-05-30 DIAGNOSIS — M545 Low back pain, unspecified: Secondary | ICD-10-CM

## 2021-05-30 DIAGNOSIS — M79675 Pain in left toe(s): Secondary | ICD-10-CM

## 2021-05-30 DIAGNOSIS — M069 Rheumatoid arthritis, unspecified: Secondary | ICD-10-CM | POA: Insufficient documentation

## 2021-06-07 NOTE — Progress Notes (Signed)
?  Subjective:  ?Patient ID: Rachael George, female    DOB: Nov 15, 1955,  MRN: [id number] ? ?Rachael George presents to clinic today for preventative diabetic foot care and painful elongated mycotic toenails 1-5 bilaterally which are tender when wearing enclosed shoe gear. Pain is relieved with periodic professional debridement. ? ?Patient did not check blood glucose today. ? ?New problem(s): None.  ? ?PCP is Loura Pardon, MD , and last visit was May 28, 2021. ? ?Allergies  ?Allergen Reactions  ? Candesartan Anaphylaxis and Other (See Comments)  ?  Swelling of the mouth and tongue. ?Other reaction(s): anaphylaxis  ? Lisinopril Anaphylaxis and Other (See Comments)  ?  Swelling of the mouth and tongue.  ? Nsaids Anaphylaxis and Other (See Comments)  ?  Swelling of the mouth and tongue.  ? Candesartan Cilexetil   ?  Other reaction(s): anaphylaxis  ? Metoprolol Tartrate   ?  Other reaction(s): rash  ? Penicillins Rash and Other (See Comments)  ?  Has patient had a PCN reaction causing immediate rash, facial/tongue/throat swelling, SOB or lightheadedness with hypotension: No ?Has patient had a PCN reaction causing severe rash involving mucus membranes or skin necrosis: No ?Has patient had a PCN reaction that required hospitalization No ?Has patient had a PCN reaction occurring within the last 10 years: No ?If all of the above answers are "NO", then may proceed with Cephalosporin use. ?  ? ?Review of Systems: Negative except as noted in the HPI. ? ?Objective: No changes noted in today's physical examination. ?Rachael George is a pleasant 66 y.o. female in NAD. AAO X 3. ? ?Vascular Examination: ?CFT immediate b/l LE. Palpable DP/PT pulses b/l LE. Digital hair absent b/l. Skin temperature gradient WNL b/l. No pain with calf compression b/l. No edema noted b/l. No cyanosis or clubbing noted b/l LE. ? ?Dermatological Examination: ?Pedal integument with normal turgor, texture and tone b/l LE. No open wounds b/l. No  interdigital macerations b/l. Toenails 1-5 b/l elongated, thickened, discolored with subungual debris. +Tenderness with dorsal palpation of nailplates. No hyperkeratotic or porokeratotic lesions present. ? ?Musculoskeletal Examination: ?Muscle strength 5/5 to all lower extremity muscle groups bilaterally. HAV with bunion deformity noted b/l LE. Wearing appropriate fitting shoe gear. ? ?Neurological Examination: ?Protective sensation intact 5/5 intact bilaterally with 10g monofilament b/l. Vibratory sensation intact b/l. ?Hemoglobin A1C Latest Ref Rng & Units 06/17/2020  ?HGBA1C 0.0 - 7.0 % 6.7  ?Some recent data might be hidden  ? ?Assessment/Plan: ?1. Pain due to onychomycosis of toenails of both feet   ?2. Type 2 diabetes mellitus with other diabetic kidney complication, without long-term current use of insulin ([place])   ?  ?-Consent given for treatment as described below: ?-Patient to continue soft, supportive shoe gear daily. ?-Mycotic toenails 1-5 bilaterally were debrided in length and girth with sterile nail nippers and dremel without incident. ?-Patient/POA to call should there be question/concern in the interim.  ? ?Return in about 3 months (around 08/30/2021). ? ?Marzetta Board, DPM  ?

## 2021-06-10 ENCOUNTER — Other Ambulatory Visit: Payer: Self-pay | Admitting: *Deleted

## 2021-06-10 NOTE — Patient Outreach (Signed)
[facility]) Care Management ? ?06/10/2021 ? ?Rachael George ?07/18/1955 ?[id number] ? ?Successful telephone outreach call to patient. HIPAA identifiers obtained. Nurse began outreach call with patient who explained she changed her PCP to Dr. Dorian Pod at [facility]. Nurse discussed with patient that her current PCP is not a [facility] provider which makes her ineligible for the [facility] benefit. Patient verbalized understanding and thanked this nurse for the care that she has received from her up to this point. ? ?Plan: Nurse Health Coach will close case and send patient a case closed letter.  ? ?Emelia Loron RN, BSN ?Nurse Health Coach ?[place] ?[phone] ?[email or web address] ? ? ? ? ? ?

## 2021-09-05 ENCOUNTER — Encounter: Payer: Self-pay | Admitting: Podiatry

## 2021-09-05 ENCOUNTER — Ambulatory Visit (INDEPENDENT_AMBULATORY_CARE_PROVIDER_SITE_OTHER): Payer: Medicare Other | Admitting: Podiatry

## 2021-09-05 DIAGNOSIS — M79674 Pain in right toe(s): Secondary | ICD-10-CM

## 2021-09-05 DIAGNOSIS — B353 Tinea pedis: Secondary | ICD-10-CM

## 2021-09-05 DIAGNOSIS — E1129 Type 2 diabetes mellitus with other diabetic kidney complication: Secondary | ICD-10-CM | POA: Diagnosis not present

## 2021-09-05 DIAGNOSIS — B351 Tinea unguium: Secondary | ICD-10-CM | POA: Diagnosis not present

## 2021-09-05 DIAGNOSIS — M79675 Pain in left toe(s): Secondary | ICD-10-CM | POA: Diagnosis not present

## 2021-12-05 DIAGNOSIS — R053 Chronic cough: Secondary | ICD-10-CM | POA: Insufficient documentation

## 2021-12-12 ENCOUNTER — Ambulatory Visit: Payer: Medicare Other | Admitting: Podiatry

## 2021-12-26 ENCOUNTER — Other Ambulatory Visit: Payer: Self-pay | Admitting: Family Medicine

## 2021-12-26 ENCOUNTER — Ambulatory Visit
Admission: RE | Admit: 2021-12-26 | Discharge: 2021-12-26 | Disposition: A | Discharge status: Home / Self Care | Payer: Medicare Other | Source: Ambulatory Visit | Attending: Family Medicine | Admitting: Family Medicine

## 2021-12-26 DIAGNOSIS — R053 Chronic cough: Secondary | ICD-10-CM

## 2022-01-03 ENCOUNTER — Inpatient Hospital Stay (HOSPITAL_COMMUNITY)
Admission: EM | Admit: 2022-01-03 | Discharge: 2022-01-06 | DRG: 092 | Disposition: A | Discharge status: Home Health | Payer: Medicare Other | Attending: Internal Medicine | Admitting: Internal Medicine

## 2022-01-03 ENCOUNTER — Other Ambulatory Visit: Payer: Self-pay

## 2022-01-03 ENCOUNTER — Emergency Department (HOSPITAL_COMMUNITY): Payer: Medicare Other

## 2022-01-03 DIAGNOSIS — N281 Cyst of kidney, acquired: Secondary | ICD-10-CM | POA: Diagnosis present

## 2022-01-03 DIAGNOSIS — R053 Chronic cough: Secondary | ICD-10-CM | POA: Diagnosis present

## 2022-01-03 DIAGNOSIS — J4520 Mild intermittent asthma, uncomplicated: Secondary | ICD-10-CM | POA: Diagnosis present

## 2022-01-03 DIAGNOSIS — K219 Gastro-esophageal reflux disease without esophagitis: Secondary | ICD-10-CM | POA: Diagnosis present

## 2022-01-03 DIAGNOSIS — G720 Drug-induced myopathy: Secondary | ICD-10-CM | POA: Diagnosis not present

## 2022-01-03 DIAGNOSIS — E871 Hypo-osmolality and hyponatremia: Secondary | ICD-10-CM

## 2022-01-03 DIAGNOSIS — J4489 Other specified chronic obstructive pulmonary disease: Secondary | ICD-10-CM | POA: Diagnosis present

## 2022-01-03 DIAGNOSIS — Z833 Family history of diabetes mellitus: Secondary | ICD-10-CM

## 2022-01-03 DIAGNOSIS — E669 Obesity, unspecified: Secondary | ICD-10-CM | POA: Diagnosis present

## 2022-01-03 DIAGNOSIS — E7800 Pure hypercholesterolemia, unspecified: Secondary | ICD-10-CM | POA: Diagnosis present

## 2022-01-03 DIAGNOSIS — Z87442 Personal history of urinary calculi: Secondary | ICD-10-CM

## 2022-01-03 DIAGNOSIS — J45909 Unspecified asthma, uncomplicated: Secondary | ICD-10-CM

## 2022-01-03 DIAGNOSIS — Z1152 Encounter for screening for COVID-19: Secondary | ICD-10-CM

## 2022-01-03 DIAGNOSIS — N390 Urinary tract infection, site not specified: Secondary | ICD-10-CM | POA: Diagnosis present

## 2022-01-03 DIAGNOSIS — R531 Weakness: Secondary | ICD-10-CM

## 2022-01-03 DIAGNOSIS — E1165 Type 2 diabetes mellitus with hyperglycemia: Secondary | ICD-10-CM | POA: Insufficient documentation

## 2022-01-03 DIAGNOSIS — N179 Acute kidney failure, unspecified: Secondary | ICD-10-CM

## 2022-01-03 DIAGNOSIS — Z8701 Personal history of pneumonia (recurrent): Secondary | ICD-10-CM

## 2022-01-03 DIAGNOSIS — Z79899 Other long term (current) drug therapy: Secondary | ICD-10-CM

## 2022-01-03 DIAGNOSIS — U099 Post covid-19 condition, unspecified: Secondary | ICD-10-CM | POA: Diagnosis present

## 2022-01-03 DIAGNOSIS — Z7982 Long term (current) use of aspirin: Secondary | ICD-10-CM

## 2022-01-03 DIAGNOSIS — Z90710 Acquired absence of both cervix and uterus: Secondary | ICD-10-CM

## 2022-01-03 DIAGNOSIS — Z7951 Long term (current) use of inhaled steroids: Secondary | ICD-10-CM

## 2022-01-03 DIAGNOSIS — T380X5A Adverse effect of glucocorticoids and synthetic analogues, initial encounter: Secondary | ICD-10-CM | POA: Diagnosis present

## 2022-01-03 DIAGNOSIS — D638 Anemia in other chronic diseases classified elsewhere: Secondary | ICD-10-CM | POA: Diagnosis present

## 2022-01-03 DIAGNOSIS — Z8249 Family history of ischemic heart disease and other diseases of the circulatory system: Secondary | ICD-10-CM

## 2022-01-03 DIAGNOSIS — Z8711 Personal history of peptic ulcer disease: Secondary | ICD-10-CM

## 2022-01-03 DIAGNOSIS — Y830 Surgical operation with transplant of whole organ as the cause of abnormal reaction of the patient, or of later complication, without mention of misadventure at the time of the procedure: Secondary | ICD-10-CM | POA: Diagnosis present

## 2022-01-03 DIAGNOSIS — N186 End stage renal disease: Secondary | ICD-10-CM | POA: Diagnosis present

## 2022-01-03 DIAGNOSIS — Z8719 Personal history of other diseases of the digestive system: Secondary | ICD-10-CM

## 2022-01-03 DIAGNOSIS — Z79624 Long term (current) use of inhibitors of nucleotide synthesis: Secondary | ICD-10-CM

## 2022-01-03 DIAGNOSIS — M6281 Muscle weakness (generalized): Secondary | ICD-10-CM | POA: Diagnosis present

## 2022-01-03 DIAGNOSIS — I10 Essential (primary) hypertension: Secondary | ICD-10-CM | POA: Diagnosis present

## 2022-01-03 DIAGNOSIS — T8619 Other complication of kidney transplant: Secondary | ICD-10-CM | POA: Diagnosis present

## 2022-01-03 DIAGNOSIS — E039 Hypothyroidism, unspecified: Secondary | ICD-10-CM | POA: Diagnosis present

## 2022-01-03 DIAGNOSIS — Z7952 Long term (current) use of systemic steroids: Secondary | ICD-10-CM

## 2022-01-03 DIAGNOSIS — Z888 Allergy status to other drugs, medicaments and biological substances status: Secondary | ICD-10-CM

## 2022-01-03 DIAGNOSIS — Z940 Kidney transplant status: Secondary | ICD-10-CM

## 2022-01-03 DIAGNOSIS — M069 Rheumatoid arthritis, unspecified: Secondary | ICD-10-CM | POA: Diagnosis present

## 2022-01-03 DIAGNOSIS — R29898 Other symptoms and signs involving the musculoskeletal system: Secondary | ICD-10-CM | POA: Insufficient documentation

## 2022-01-03 DIAGNOSIS — Z7985 Long-term (current) use of injectable non-insulin antidiabetic drugs: Secondary | ICD-10-CM

## 2022-01-03 DIAGNOSIS — G4733 Obstructive sleep apnea (adult) (pediatric): Secondary | ICD-10-CM | POA: Diagnosis present

## 2022-01-03 DIAGNOSIS — Z886 Allergy status to analgesic agent status: Secondary | ICD-10-CM

## 2022-01-03 DIAGNOSIS — Z6835 Body mass index (BMI) 35.0-35.9, adult: Secondary | ICD-10-CM

## 2022-01-03 DIAGNOSIS — Z880 Allergy status to penicillin: Secondary | ICD-10-CM

## 2022-01-03 LAB — URINALYSIS, ROUTINE W REFLEX MICROSCOPIC
Bilirubin Urine: NEGATIVE
Glucose, UA: NEGATIVE mg/dL
Ketones, ur: NEGATIVE mg/dL
Nitrite: NEGATIVE
Protein, ur: 100 mg/dL — AB
Specific Gravity, Urine: 1.013 (ref 1.005–1.030)
WBC, UA: >50 WBC/hpf — ABNORMAL HIGH (ref 0–5)
pH: 5 (ref 5.0–8.0)

## 2022-01-03 LAB — COMPREHENSIVE METABOLIC PANEL
ALT: 20 U/L (ref 0–44)
AST: 18 U/L (ref 15–41)
Albumin: 2.1 g/dL — ABNORMAL LOW (ref 3.5–5.0)
Alkaline Phosphatase: 85 U/L (ref 38–126)
Anion gap: 14 (ref 5–15)
BUN: 50 mg/dL — ABNORMAL HIGH (ref 8–23)
CO2: 22 mmol/L (ref 22–32)
Calcium: 9.2 mg/dL (ref 8.9–10.3)
Chloride: 93 mmol/L — ABNORMAL LOW (ref 98–111)
Creatinine, Ser: 2.73 mg/dL — ABNORMAL HIGH (ref 0.44–1.00)
GFR, Estimated: 19 mL/min — ABNORMAL LOW (ref >60)
Glucose, Bld: 342 mg/dL — ABNORMAL HIGH (ref 70–99)
Potassium: 3.7 mmol/L (ref 3.5–5.1)
Sodium: 129 mmol/L — ABNORMAL LOW (ref 135–145)
Total Bilirubin: 1 mg/dL (ref 0.3–1.2)
Total Protein: 5.9 g/dL — ABNORMAL LOW (ref 6.5–8.1)

## 2022-01-03 LAB — CBC
HCT: 28.5 % — ABNORMAL LOW (ref 36.0–46.0)
Hemoglobin: 9.4 g/dL — ABNORMAL LOW (ref 12.0–15.0)
MCH: 25 pg — ABNORMAL LOW (ref 26.0–34.0)
MCHC: 33 g/dL (ref 30.0–36.0)
MCV: 75.8 fL — ABNORMAL LOW (ref 80.0–100.0)
Platelets: 114 10*3/uL — ABNORMAL LOW (ref 150–400)
RBC: 3.76 MIL/uL — ABNORMAL LOW (ref 3.87–5.11)
RDW: 14.6 % (ref 11.5–15.5)
WBC: 7.6 10*3/uL (ref 4.0–10.5)
nRBC: 0 % (ref 0.0–0.2)

## 2022-01-03 LAB — RESP PANEL BY RT-PCR (FLU A&B, COVID) ARPGX2
Influenza A by PCR: NEGATIVE
Influenza B by PCR: NEGATIVE
SARS Coronavirus 2 by RT PCR: NEGATIVE

## 2022-01-03 LAB — HEMOGLOBIN A1C
Hgb A1c MFr Bld: 8.4 % — ABNORMAL HIGH (ref 4.8–5.6)
Mean Plasma Glucose: 194.38 mg/dL

## 2022-01-03 LAB — PREALBUMIN: Prealbumin: 8 mg/dL — ABNORMAL LOW (ref 18–38)

## 2022-01-03 LAB — TSH: TSH: 0.344 u[IU]/mL — ABNORMAL LOW (ref 0.350–4.500)

## 2022-01-03 LAB — CK: Total CK: 15 U/L — ABNORMAL LOW (ref 38–234)

## 2022-01-03 LAB — CBG MONITORING, ED: Glucose-Capillary: 283 mg/dL — ABNORMAL HIGH (ref 70–99)

## 2022-01-03 LAB — BETA-HYDROXYBUTYRIC ACID: Beta-Hydroxybutyric Acid: 1.14 mmol/L — ABNORMAL HIGH (ref 0.05–0.27)

## 2022-01-03 MED ORDER — TACROLIMUS ER 4 MG PO TB24
9.0000 mg | ORAL_TABLET | Freq: Every day | ORAL | Status: DC
  Filled 2022-01-03: qty 1

## 2022-01-03 MED ORDER — MYCOPHENOLATE SODIUM 180 MG PO TBEC
360.0000 mg | DELAYED_RELEASE_TABLET | Freq: Two times a day (BID) | ORAL | Status: DC
  Administered 2022-01-04: 360 mg via ORAL
  Filled 2022-01-03 (×2): qty 2

## 2022-01-03 MED ORDER — SODIUM CHLORIDE 0.9 % IV BOLUS
1000.0000 mL | Freq: Once | INTRAVENOUS | Status: AC
  Administered 2022-01-03: 1000 mL via INTRAVENOUS

## 2022-01-03 MED ORDER — ATORVASTATIN CALCIUM 80 MG PO TABS: 80.0000 mg | ORAL_TABLET | ORAL | Status: DC

## 2022-01-03 MED ORDER — ACETAMINOPHEN 650 MG RE SUPP: 650.0000 mg | Freq: Four times a day (QID) | RECTAL | Status: DC | PRN

## 2022-01-03 MED ORDER — ALBUTEROL SULFATE (2.5 MG/3ML) 0.083% IN NEBU
2.5000 mg | INHALATION_SOLUTION | Freq: Four times a day (QID) | RESPIRATORY_TRACT | Status: DC | PRN
  Administered 2022-01-04: 2.5 mg via RESPIRATORY_TRACT
  Filled 2022-01-03: qty 3

## 2022-01-03 MED ORDER — PANTOPRAZOLE SODIUM 40 MG PO TBEC
40.0000 mg | DELAYED_RELEASE_TABLET | Freq: Every day | ORAL | Status: DC
  Administered 2022-01-04 – 2022-01-06 (×3): 40 mg via ORAL
  Filled 2022-01-03 (×3): qty 1

## 2022-01-03 MED ORDER — POLYETHYLENE GLYCOL 3350 17 G PO PACK: 17.0000 g | PACK | Freq: Every day | ORAL | Status: DC | PRN

## 2022-01-03 MED ORDER — HEPARIN SODIUM (PORCINE) 5000 UNIT/ML IJ SOLN
5000.0000 [IU] | Freq: Three times a day (TID) | INTRAMUSCULAR | Status: DC
  Administered 2022-01-03 – 2022-01-06 (×8): 5000 [IU] via SUBCUTANEOUS
  Filled 2022-01-03 (×8): qty 1

## 2022-01-03 MED ORDER — ACETAMINOPHEN 325 MG PO TABS
650.0000 mg | ORAL_TABLET | Freq: Four times a day (QID) | ORAL | Status: DC | PRN
  Administered 2022-01-04 – 2022-01-06 (×2): 650 mg via ORAL
  Filled 2022-01-03: qty 2

## 2022-01-03 MED ORDER — ASPIRIN 81 MG PO TBEC
81.0000 mg | DELAYED_RELEASE_TABLET | Freq: Every day | ORAL | Status: DC
  Administered 2022-01-04 – 2022-01-06 (×3): 81 mg via ORAL
  Filled 2022-01-03 (×3): qty 1

## 2022-01-03 MED ORDER — INSULIN ASPART 100 UNIT/ML IJ SOLN
0.0000 [IU] | Freq: Every day | INTRAMUSCULAR | Status: DC
  Administered 2022-01-03: 3 [IU] via SUBCUTANEOUS
  Administered 2022-01-04 – 2022-01-05 (×2): 2 [IU] via SUBCUTANEOUS

## 2022-01-03 MED ORDER — INSULIN ASPART 100 UNIT/ML IJ SOLN
0.0000 [IU] | Freq: Three times a day (TID) | INTRAMUSCULAR | Status: DC
  Administered 2022-01-04: 5 [IU] via SUBCUTANEOUS
  Administered 2022-01-04: 3 [IU] via SUBCUTANEOUS
  Administered 2022-01-04 – 2022-01-05 (×2): 5 [IU] via SUBCUTANEOUS
  Administered 2022-01-05: 7 [IU] via SUBCUTANEOUS
  Administered 2022-01-05: 2 [IU] via SUBCUTANEOUS
  Administered 2022-01-06: 5 [IU] via SUBCUTANEOUS
  Administered 2022-01-06: 2 [IU] via SUBCUTANEOUS

## 2022-01-03 MED ORDER — SENNA 8.6 MG PO TABS: 1.0000 | ORAL_TABLET | Freq: Every evening | ORAL | Status: DC | PRN

## 2022-01-03 MED ORDER — SODIUM CHLORIDE 0.9 % IV SOLN
1.0000 g | Freq: Once | INTRAVENOUS | Status: AC
  Administered 2022-01-03: 1 g via INTRAVENOUS
  Filled 2022-01-03: qty 10

## 2022-01-03 MED ORDER — AMLODIPINE BESYLATE 5 MG PO TABS
5.0000 mg | ORAL_TABLET | Freq: Every day | ORAL | Status: DC
  Administered 2022-01-04: 5 mg via ORAL
  Filled 2022-01-03: qty 1

## 2022-01-03 NOTE — ED Notes (Signed)
Admitting provider at bedside.

## 2022-01-03 NOTE — Hospital Course (Signed)
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    #Weakness, bilateral legs Patient presents with 2 weeks of bilateral lower extremity weakness. Patient is on chronic prednisone 5 mg following her renal transplant but notes that her symptoms began after finishing a 5-day course of prednisone 40 mg for a recent covid infection.  CK is low which is less concerning for statin-induced myopathy vs muscle injury. Validity of CK is questionable given that chronic steroid use can decrease CK. Suspect that her LE weakness is secondary to steroid-induced myopathy in the setting of increased steroid dose and decreased physical activity. Differential diagnoses for her weakness also includes physical deconditioning in the setting of recent covid infection vs UTI. Prealbumin is low which would support that malnutrition may also be playing a role in her weakness. Her weakness improved and she was able to ambulate at time of discharge. PT/OT recommended SNF but she opted for home health at time of discharge.   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  Creatinine elevated at 2.73 on admission. Likely prerenal in the setting of recent viral illness. Patient given 1L IVF bolus in the ED. Restarted on home immunosuppressive regimen. Nephrology consulted. Recommended continuing home immunosuppression meds (envarsus, myfortic, prednisone) and obtaining tacrolimus level (pending at time of discharge). She was given additional bolus and maintenance IVF. Her Cr continued to downtrend and was 2.17 at time of discharge. Nephrotoxic medications were avoided. Renal U/S was obtained which showed no evidence of hydronephrosis.    #UTI Patient endorses 1 week of  chills and dysuria. Patient also notes that her urine is dark and sweet smelling. Urinalysis demonstrates a large amount of leukocytes and many bacteria concerning for UTI. Ucx showed >100k E.coli.  Patient received 1g Ceftriaxone in the ED. Patient has penicillin allergy and ESRD s/p renal transplant. She was transitioned to cefdinir (10/15-10/18). She reported improving dysuria at time of discharge.    #Post-covid mild chronic bronchitis  #Asthma Patient endorses several weeks of cough productive of yellow sputum. Home medications include Albuterol and Symbicort. Chest x-ray is negative. COVID and flu swab negative. Respiratory gram stain demonstrates gram negative rods, gram positive rods, and gram positive cocci in pairs. Lungs sound good on exam. Low suspicion for pneumonia or asthma exacerbation at this time. Suspect that her cough is due to recent COVID infections in the setting of her chronic asthma. She was continued on albuterol nebulizer, Breo Ellipta inhaler, and Robitussin.  # T2DM, uncontrolled Patient uses Trulicity 6.07 mg weekly and states that she is compliant with this medication. Glucose 342 on admission. A1c 8.4. She was started on insulin and SSI during admission. She was discharged on Trulicity with recommendation to follow-up with PCP for additional diabetes management.   #Microcytic anemia  Suspect anemia of chronic disease. Colonoscopy in 2017 with 2 sessile polyps in cecum that were removed. Last CBC with microcytic anemia in 07/2021. Repeat iron studies consistent with anemia of chronic disease. Smear and retic count unremarkable. Will need colonscopy outpatient.   # HTN Chronic condition. Takes amlodipine 5 mg, HCTZ 12.5 mg (MWF), and carvedilol 12.5 mg BID at home. BP WNL during admission. Home anti-HTN meds held in the setting of low-normal BP and  AKI. She was advised to follow-up with her PCP re additional BP management.   # HLD Chronic condition. She was continued on  her home rosuvastatin 20 mg   # GERD Chronic condition. She was continued on her home pantoprazole 40 mg.

## 2022-01-03 NOTE — ED Triage Notes (Signed)
Pt arrived [facility]  former dialysis pt kidney tx June 21st, past 2 weeks increased weakness with frequent urination, odor and dark color to urine per pt. 300 ml of ns given with ems.  22 in rt ac.  Hyperglycemic with ems blood sugar 351 110/60  96 pulse  18 rr 100% on 2l Capnography 35

## 2022-01-03 NOTE — ED Notes (Signed)
Patient denies pain and is resting comfortably.  

## 2022-01-03 NOTE — H&P (Incomplete)
Date: 01/03/2022               Patient Name:  Rachael George MRN: [id number]  DOB: 1955-05-16 Age / Sex: 66 y.o., female   PCP: Loura Pardon, MD         Medical Service: Internal Medicine Teaching Service         Attending Physician: Dr. Jimmye Norman, Elaina Pattee, MD    First Contact: Dr. Nani Gasser Pager: [phone]  Second Contact: Dr. Gaylan Gerold Pager: [phone]       After Hours (After 5p/  First Contact Pager: [phone]  weekends / holidays): Second Contact Pager: [phone]   Chief Complaint: Bilateral LE weakness   History of Present Illness:   Rachael George is a 66 y/o female with past medical history of HTN, HLD, T2DM, RA, ESRD s/p R renal transplant (07/2019), GERD, OSA (not on CPAP), asthma, and obesity that presents w/ 2 weeks of bilateral LE weakness.  Patient states that she first noted her symptoms 2 weeks ago upon awakening as she was unable to get out of bed. Patient states that she has been having difficulty getting out of bed over this period of time and has required assistance from her grandchildren at home. Patient states that prior to this episodes of weakness she was independent of her ADLs/IADLs. Patient states that she has never experienced these symptoms before.  She states that she takes prednisone 5 mg daily due to her renal transplant but that she was recently prescribed a 5 day course of prednisone 40 mg as well as a prescription for Symbicort inhaler by her PCP due to a recent Covid infection.  She notes that her lower extremity weakness began after finishing this course of prednisone 40 mg. She denies having any pain in her lower extremities or elsewhere.  Patient also endorses chills, dysuria, polyuria, and fatigue over the last week.  Patient also notes that her urine "smells sweet" and has become dark recently.  She states that she checks her blood sugar twice daily at home and more recently has noticed values in the 300s-400s.  Patient endorses  having a cough productive of yellow sputum for several weeks.  She states that she was aslo found to be positive for COVID 7 weeks ago. Patient states that her cough has caused her to vomit roughly 2 times per day over the last week.  She notes pain in her ribs over the last week that she attributes to her frequent coughing.  Patient endorses chronic episodes of shortness of breath that she attributes to her history of asthma.  Patient states that her episodes of shortness of breath are relatively constant and that her Symbicort and albuterol inhalers resolve her shortness of breath.  States she follows up with her nephrologist Dr. Posey Pronto every 3 months.  Patient states that she last saw nephrology 1 month ago.  Patient denies fever, diarrhea, constipation, lightheadedness, dizziness, syncope, numbness, tingling, chest pain, pelvic pain. Patient denies experiencing recent trauma, falls, or accidents. Patient denies past medical history of MI, CVA, heart disease, or cancer. States that she has not been hospitalized since 2021.    ED Course:  - CXR negative - 1g Ceftriaxone given for UTI - 1L 0.9% NaCl bolus given  Meds:  - Fluticasone nasal spray 50 mcg - Albuterol 2.5 mg/3 mL - Symbicort 160 mcg - Aspirin 81 mg  - Cyclobenzaprine 10 mg BID PRN - Meclizine 25 mg TID Prn - Trulicity 1.66 mg  weekly - Prednisone 5 mg  - Mycophenolate 180 mg (2 tablets BID) - Tacrolimus 1 mg (9 tablets daily) - Carvedilol 12.5 mg (BID) - Amlodipine 5 mg  - Hydrochlorothiazide 12.5 mg (MWF) - Rosuvastatin 20 mg  - Pantoprazole 40 mg - Cinacalcelt 30 mg - Cholecalciferol 25 mcg (1,000 units) - Magnesium oxide 400 mg (2 tablets TID) - Paxlovid 150-100 mg BID (for 5 days) No outpatient medications have been marked as taking for the 01/03/22 encounter [facility] Encounter).   Allergies: Allergies as of 01/03/2022 - Review Complete 09/05/2021  Allergen Reaction Noted   Candesartan Anaphylaxis and Other (See  Comments) 12/07/2012   Lisinopril Anaphylaxis and Other (See Comments) 12/07/2012   Nsaids Anaphylaxis and Other (See Comments) 12/07/2012   Candesartan cilexetil  12/29/2019   Metoprolol tartrate  12/29/2019   Penicillins Rash and Other (See Comments) 12/07/2012   Past Medical History:  Diagnosis Date   Anemia    Asthma    ESRD (end stage renal disease) (HCC)    MWF   GERD (gastroesophageal reflux disease)    Gout    Heart murmur    born with it- nothing to worry about   Heavy menstrual bleeding    High cholesterol    History of blood transfusion 02/2016- last one   1990's- several ones    History of hiatal hernia    History of kidney stones 1980's   Hyperlipemia 12/07/2012   Hypertension    Hypothyroidism    Multiple gastric ulcers    Pneumonia 2012   Pre-diabetes    Prediabetes    RA (rheumatoid arthritis) ([place])    Sleep apnea    [place]- have CPAP, not able to use every night    PCP: Himanshu Paliwal (Family Medicine)  Allergies: as listed in chart.   Family History: T2DM (mother), breast cancer (Aunt), prostate cancer (grandfather) -Patient denies family history of heart disease, MI, CVA, cancer.  Social History: Patient lives at home with her grandchildren.  Prior to this episode of weakness the patient was independent of ADL/IADLs. Denies current or previous use of alcohol, smoking, or illicit drugs.  Review of Systems: A complete ROS was negative except as per HPI.   Physical Exam: Blood pressure 118/73, pulse 93, temperature 98.3 F (36.8 C), resp. rate 15, height 5\' 1"  (1.549 m), weight 84.4 kg, SpO2 99 %.  Constitutional: appears anxious, pleasant and talkative HENT: Normocephalic and atraumatic.  Eyes: EOM are normal. PERRL.  Neck: Normal range of motion.  Cardiovascular: Regular rate, regular rhythm. 2/6 systolic murmur heard best at the RUSB. No rubs or gallops. Normal radial, PT, and DP pulses bilaterally. Trace LE edema bilaterally.   Pulmonary: Normal respiratory effort. No wheezes, rales, or rhonchi.   Abdominal: Soft. Non-distended. No tenderness. Normal bowel sounds.  Musculoskeletal: Normal range of motion.     Neurological: Alert and oriented to person, place, and time. CN II-XII intact. No facial droop. Normal finger-to-nose testing bilaterally. Normal sensation. 5/5 strength in bilateral LE. 2/5 strength of bilateral LE. Heel to shin testing is slow but normal bilaterally.  Skin: warm and dry.    EKG: personally reviewed my interpretation is sinus rhythm with RSR in leads V1-V2.   CXR: personally reviewed my interpretation is no active cardiopulmonary disease.    Assessment & Plan by Problem: Principal Problem:   Weakness  Rachael George is a 66 y/o female with past medical history of HTN, HLD, T2DM, RA, ESRD s/p R renal transplant (07/2019), GERD, OSA (  not on CPAP), asthma, and obesity that presents w/ 2 weeks of bilateral LE weakness and was admitted for further evaluation of her weakness.   # Weakness Patient presents with 2 weeks of bilateral lower extremity weakness.  Patient states her symptoms began after finishing a 5-day course of prednisone 40 mg for a recent covid infection.  On exam, patient has 2/5 strength of bilateral lower extremities but otherwise normal neurological exam.Suspect that her LE weakness is secondary to steroid-induced myopathy vs physical deconditioning from recent covid infection vs UTI.  - Holding home prednisone 5 mg   # UTI Patient endorses 1 week of chills and dysuria.  Patient also notes urine that is dark and sweet smelling.  Urinalysis demonstrates a large amount of leukocytes and many bacteria concerning for UTI. Patient received 1g Ceftriaxone in the ED.  - Pending urine culture  # AKI # ESRD s/p R renal transplant Baseline creatinine appears to be near 1.1-1.2.  Creatinine elevated at 2.73 on admission.  Likely prerenal in the setting of recent COVID infection.  Patient  given 1L 0.9% NaCl bolus in the ED.  - Encourage PO intake - Home mycophenolate 360 mg BID - Home Tacrolimus 9 mg - Trend BMP  4. # Hyponatremia Noted on CMP.  Sodium corrects to 133 when considering hyperglycemia. Patient given 1L 0.9% NaCl bolus in the ED.  - Trend BMP  4. # Cough Patient endorses several weeks of cough productive of yellow sputum.  Chest x-ray is negative. Lungs sound good on exam.  Low suspicion for pneumonia at this time. Suspect that her cough is related to her history of asthma in addition to her recent COVID infections. - Home albuterol - Pending COVID/flu swab - ***Symbicort?  5. # T2DM Patient uses Trulicity 0.30 mg weekly and states that she is compliant with this medication. Glucose 342 on admission. A1c 8.4. Beta hydroxybutyrate mildly elevated at 1.14. Potentially related to recent increase in prednisone dose. Low suspicion for DKA at this time. - SSI (sensitive) w/ HS coverage  6. # HTN Chronic condition. BP WNL on admission. - Home amlodipine 5mg   7. # HLD Chronic condition. - Home rosuvastatin 20 mg *** Stop atorvastatin?  8. # GERD Chronic condition. - Home pantoprazole 40 mg    Diet: HH/CM Bowel: miralax, senna VTE: heparin IVF: none Code: Full   Prior to Admission Living Arrangement: Anticipated Discharge Location: Barriers to Discharge: continued management  Dispo: Admit patient to Observation with expected length of stay less than 2 midnights.  SignedStarlyn Skeans, MD 01/03/2022, 9:27 PM  Pager: [phone] After 5pm on weekdays and 1pm on weekends: On Call pager: [phone]

## 2022-01-03 NOTE — ED Provider Notes (Signed)
66yo female with hx kidney transplant. 2 weeks generalized weakness. Given prednisone and supportive meds for possible COVID infection.  SHOB with exertion, no CP. Slight dysuria.  Pending all labs Put on O2 by EMS for comfort.  Physical Exam  BP 122/68   Pulse 91   Temp 98.3 F (36.8 C)   Resp 20   Ht 5\' 1"  (1.549 m)   Wt 84.4 kg   SpO2 96%   BMI 35.14 kg/m   Physical Exam  Procedures  .Critical Care  Performed by: Tacy Learn, PA-C Authorized by: Tacy Learn, PA-C   Critical care provider statement:    Critical care time (minutes):  30   Critical care was time spent personally by me on the following activities:  Development of treatment plan with patient or surrogate, discussions with consultants, evaluation of patient's response to treatment, examination of patient, ordering and review of laboratory studies, ordering and review of radiographic studies, ordering and performing treatments and interventions, pulse oximetry, re-evaluation of patient's condition and review of old charts   ED Course / MDM   Clinical Course as of 01/03/22 1837  Sat Jan 03, 2022  1751 Comprehensive metabolic panel(!) [LM]    Clinical Course User Index [LM] Tacy Learn, PA-C   Medical Decision Making Amount and/or Complexity of Data Reviewed Labs: ordered. Radiology: ordered.  Risk Decision regarding hospitalization.   Discussed results with patient.  Lab work concerning for urinary tract infection, AKI, hyponatremia (sodium corrects to 133), hyperglycemia.  Not in DKA.  Given Rocephin for her urinary tract infection, IV fluids ordered with plan to consult for admission.       Tacy Learn, PA-C 01/03/22 Kristian Covey    Noemi Chapel, MD 01/06/22 [phone]

## 2022-01-03 NOTE — ED Provider Notes (Signed)
[place] EMERGENCY DEPARTMENT Provider Note   CSN: [id number] Arrival date & time: 01/03/22  1354     History  No chief complaint on file.   Rachael George is a 66 y.o. female with a past medical history of kidney transplant, diabetes, hypertension, who presents to the emergency department brought in by EMS with concerns for generalized weakness onset 2 weeks.  Notes that previously she was being treated for COVID by her primary care provider.  She is on daily prednisone however she does not a prescription for 40 mg prednisone x5 days, cough syrup, inhaler for her symptoms.  Notes that her symptoms began to worsen after.  Has associated productive cough (yellow sputum), shortness of breath with exertion, and dysuria, sweet smell to her urine, dark urine.  No additional meds tried at home.  Denies chest pain, rhinorrhea, nasal congestion, abdominal pain, nausea, vomiting, hematuria.  She was last evaluated by her kidney transplant specialist on 07/29/2021.  The history is provided by the patient. No language interpreter was used.       Home Medications Prior to Admission medications   Medication Sig Start Date End Date Taking? Authorizing Provider  Accu-Chek Softclix Lancets lancets USE AS DIRECTED TO TEST DAILY 01/13/21   Charlott Rakes, MD  acetaminophen (TYLENOL) 650 MG CR tablet Take 650-1,300 mg by mouth every 8 (eight) hours as needed for pain.    [provider]  albuterol (PROVENTIL) (2.5 MG/3ML) 0.083% nebulizer solution Take 3 mLs (2.5 mg total) by nebulization every 6 (six) hours as needed for wheezing or shortness of breath. ICD 10:J45.20 05/09/19   Charlott Rakes, MD  albuterol (VENTOLIN HFA) 108 (90 Base) MCG/ACT inhaler INHALE 2 PUFFS BY MOUTH EVERY 6 HOURS AS NEEDED FOR WHEEZE OR SHORTNESS OF BREATH 06/17/20   Charlott Rakes, MD  amLODipine (NORVASC) 10 MG tablet TAKE 1 TABLET BY MOUTH EVERYDAY AT BEDTIME 11/26/19   Charlott Rakes, MD   amLODipine (NORVASC) 5 MG tablet tablet 05/02/21   [provider]  aspirin 81 MG EC tablet Take 1 tablet by mouth daily. 07/31/19   [provider]  Blood Glucose Monitoring Suppl (ACCU-CHEK GUIDE) w/Device KIT 1 each by Does not apply route daily. Use as instructed to check blood sugar once daily. E11.22, N18.6, Z99.2 08/07/19   Charlott Rakes, MD  budesonide-formoterol (SYMBICORT) 160-4.5 MCG/ACT inhaler Inhale 2 puffs into the lungs 2 (two) times daily for 1 day. 12/19/19 12/20/19  Charlott Rakes, MD  carvedilol (COREG) 25 MG tablet Take 1 tablet (25 mg total) by mouth 2 (two) times daily with a meal. 05/23/19   Charlott Rakes, MD  chlorpheniramine (CHLOR-TRIMETON) 4 MG tablet Take 1 tablet (4 mg total) by mouth 3 (three) times daily. Patient taking differently: Take 8 mg by mouth daily at 12 noon. 05/20/17   Marshell Garfinkel, MD  Cholecalciferol 25 MCG (1000 UT) capsule Take by mouth. 01/11/20   [provider]  cinacalcet (SENSIPAR) 90 MG tablet Pt takes 1 90 mg tablet with 2 30 mg tablets. Total of 150MG    [provider]  cyclobenzaprine (FLEXERIL) 10 MG tablet Take 1 tablet (10 mg total) by mouth 2 (two) times daily as needed. For back pain 07/31/20   Charlott Rakes, MD  Dulaglutide (TRULICITY) 4.50 TU/8.8KC SOPN ADMINISTER 0.75 MG UNDER THE SKIN 1 TIME A WEEK 03/28/21   Charlott Rakes, MD  fluticasone (FLONASE) 50 MCG/ACT nasal spray SPRAY 2 SPRAYS INTO EACH NOSTRIL EVERY DAY 07/06/20  Charlott Rakes, MD  glucose blood (ACCU-CHEK GUIDE) test strip USE TO TEST BLOOD SUGAR LEVELS DAILY 01/07/21   Charlott Rakes, MD  hydrochlorothiazide (HYDRODIURIL) 12.5 MG tablet Take by mouth. 11/04/20   [provider]  magnesium oxide (MAG-OX) 400 MG tablet Take 2 tablets by mouth 2 (two) times daily. 09/25/19   [provider]  meclizine (ANTIVERT) 25 MG tablet Take 25 mg by mouth 3 (three) times daily as needed for dizziness.    [provider]   multivitamin (RENA-VIT) TABS tablet Take 1 tablet by mouth daily with lunch.     [provider]  mycophenolate (MYFORTIC) 180 MG EC tablet Take 360 mg by mouth 2 (two) times daily. 07/01/20   [provider]  nystatin (MYCOSTATIN/NYSTOP) powder Apply 1 application topically 3 (three) times daily. Patient not taking: Reported on 03/18/2021 12/30/20   Charlott Rakes, MD  pantoprazole (PROTONIX) 40 MG tablet TAKE 1 TABLET(40 MG) BY MOUTH TWICE DAILY 12/30/20   Charlott Rakes, MD  predniSONE (DELTASONE) 5 MG tablet  05/02/21   [provider]  RESTASIS 0.05 % ophthalmic emulsion 1 drop 2 (two) times daily. 01/17/21   [provider]  rosuvastatin (CRESTOR) 20 MG tablet TAKE 1 TABLET(20 MG) BY MOUTH DAILY 01/30/21   Charlott Rakes, MD  tacrolimus ER (ENVARSUS XR) 1 MG TB24 9 Tablet by mouth daily 05/07/20   [provider]      Allergies    Candesartan, Lisinopril, Nsaids, Candesartan cilexetil, Metoprolol tartrate, and Penicillins    Review of Systems   Review of Systems  All other systems reviewed and are negative.   Physical Exam Updated Vital Signs BP 117/76   Pulse 93   Temp 98.3 F (36.8 C)   Resp 16   Ht _0  (1.549 m)   Wt 84.4 kg   SpO2 98%   BMI 35.14 kg/m  Physical Exam Vitals and nursing note reviewed.  Constitutional:      General: She is not in acute distress.    Appearance: She is not diaphoretic.  HENT:     Head: Normocephalic and atraumatic.     Mouth/Throat:     Pharynx: No oropharyngeal exudate.  Eyes:     General: No scleral icterus.    Conjunctiva/sclera: Conjunctivae normal.  Cardiovascular:     Rate and Rhythm: Normal rate and regular rhythm.     Pulses: Normal pulses.     Heart sounds: Normal heart sounds.  Pulmonary:     Effort: Pulmonary effort is normal. No respiratory distress.     Breath sounds: Normal breath sounds. No wheezing.  Abdominal:     General: Bowel sounds are normal.      Palpations: Abdomen is soft. There is no mass.     Tenderness: There is no abdominal tenderness. There is no guarding or rebound.  Musculoskeletal:        General: Normal range of motion.     Cervical back: Normal range of motion and neck supple.  Skin:    General: Skin is warm and dry.  Neurological:     General: No focal deficit present.     Mental Status: She is alert.     Cranial Nerves: Cranial nerves 2-12 are intact.     Sensory: Sensation is intact. No sensory deficit.     Motor: Motor function is intact. No pronator drift.     Coordination: Coordination is intact. Finger-Nose-Finger Test and Heel to Ceex Haci Test normal.     Comments:  Strength sensation intact to bilateral upper and lower extremities.  No focal neurological deficit noted on exam.  No facial droop noted on exam.  Negative pronator drift.  Normal finger-nose testing normal heel-to-shin testing.  Psychiatric:        Behavior: Behavior normal.     ED Results / Procedures / Treatments   Labs (all labs ordered are listed, but only abnormal results are displayed) Labs Reviewed  URINE CULTURE  RESP PANEL BY RT-PCR (FLU A&B, COVID) ARPGX2  CBC  URINALYSIS, ROUTINE W REFLEX MICROSCOPIC  COMPREHENSIVE METABOLIC PANEL  BETA-HYDROXYBUTYRIC ACID  CBG MONITORING, ED    EKG EKG Interpretation  Date/Time:  Saturday January 03 2022 14:12:52 EDT Ventricular Rate:  95 PR Interval:  136 QRS Duration: 81 QT Interval:  332 QTC Calculation: 418 R Axis:   29 Text Interpretation: Sinus rhythm Probable left atrial enlargement RSR' in V1 or V2, probably normal variant Confirmed by Regan Lemming ([id number]) on 01/03/2022 2:16:30 PM  Radiology DG Chest 2 View  Result Date: 01/03/2022 CLINICAL DATA:  Cough. Kidney transplant in June. Increased weakness and frequency of urination over the last 2 weeks. EXAM: CHEST - 2 VIEW COMPARISON:  12/26/2021. FINDINGS: Cardiac silhouette is normal in size and configuration. No mediastinal or  hilar masses. No evidence of adenopathy. Clear lungs.  No pleural effusion or pneumothorax. Skeletal structures are intact. IMPRESSION: No active cardiopulmonary disease. Electronically Signed   By: Lajean Manes M.D.   On: 01/03/2022 14:56    Procedures Procedures    Medications Ordered in ED Medications - No data to display  ED Course/ Medical Decision Making/ A&P                           Medical Decision Making Amount and/or Complexity of Data Reviewed Labs: ordered. Radiology: ordered.   Pt presents with concerns for generalized weakness onset 2 weeks.  Patient is a kidney transplant patient is on daily prednisone.  Notes her symptoms began after she was treated for COVID by her primary care provider with prednisone, inhaler, cough syrup.  Has productive cough, shortness of breath with exertion, dysuria, sweet smell to her urine, dark urine.  No chest pain.  Vital signs, stable.  On exam patient with thrill noted to left fistula site. Strength sensation intact to bilateral upper and lower extremities.  No focal neurological deficit noted on exam.  No facial droop noted on exam.  Negative pronator drift.  Normal finger-nose testing normal heel-to-shin testing.  No acute cardiovascular respiratory exam findings.  Differential diagnosis includes acute cystitis, anemia, arrhythmia, pneumonia, electrolyte abnormality, hypoglycemia.  Co morbidities that complicate the patient evaluation: kidney transplant, diabetes, hypertension  Labs:  I ordered, and personally interpreted labs.  The pertinent results include:   Urinalysis, beta hydroxybutyrate, CBC, CMP, COVID/flu swab, CBG ordered results pending at time of signout.   Imaging: I ordered imaging studies including Chest x-ray I independently visualized and interpreted imaging which showed: No acute cardiopulmonary findings I agree with the radiologist interpretation  Patient case discussed with Suella Broad, PA-C at sign-out. Plan at  sign-out is pending labs, dispo as per oncoming team. Patient care transferred at sign out.   This chart was dictated using voice recognition software, Dragon. Despite the best efforts of this provider to proofread and correct errors, errors may still occur which can change documentation meaning.   Final Clinical Impression(s) / ED Diagnoses Final diagnoses:  Generalized weakness    Rx /  DC Orders ED Discharge Orders     None         Leinani Lisbon A, PA-C 01/03/22 1525    Regan Lemming, MD 01/03/22 1939

## 2022-01-03 NOTE — ED Notes (Signed)
Admit team at bedside.

## 2022-01-04 ENCOUNTER — Encounter (HOSPITAL_COMMUNITY): Payer: Self-pay | Admitting: Internal Medicine

## 2022-01-04 DIAGNOSIS — G4733 Obstructive sleep apnea (adult) (pediatric): Secondary | ICD-10-CM | POA: Diagnosis present

## 2022-01-04 DIAGNOSIS — N179 Acute kidney failure, unspecified: Secondary | ICD-10-CM

## 2022-01-04 DIAGNOSIS — Z8701 Personal history of pneumonia (recurrent): Secondary | ICD-10-CM | POA: Diagnosis not present

## 2022-01-04 DIAGNOSIS — E039 Hypothyroidism, unspecified: Secondary | ICD-10-CM | POA: Diagnosis present

## 2022-01-04 DIAGNOSIS — U099 Post covid-19 condition, unspecified: Secondary | ICD-10-CM | POA: Diagnosis present

## 2022-01-04 DIAGNOSIS — J4520 Mild intermittent asthma, uncomplicated: Secondary | ICD-10-CM | POA: Diagnosis present

## 2022-01-04 DIAGNOSIS — K219 Gastro-esophageal reflux disease without esophagitis: Secondary | ICD-10-CM | POA: Diagnosis present

## 2022-01-04 DIAGNOSIS — T8619 Other complication of kidney transplant: Secondary | ICD-10-CM | POA: Diagnosis present

## 2022-01-04 DIAGNOSIS — Z1152 Encounter for screening for COVID-19: Secondary | ICD-10-CM | POA: Diagnosis not present

## 2022-01-04 DIAGNOSIS — R531 Weakness: Secondary | ICD-10-CM

## 2022-01-04 DIAGNOSIS — D638 Anemia in other chronic diseases classified elsewhere: Secondary | ICD-10-CM | POA: Diagnosis present

## 2022-01-04 DIAGNOSIS — N390 Urinary tract infection, site not specified: Secondary | ICD-10-CM | POA: Diagnosis present

## 2022-01-04 DIAGNOSIS — M6281 Muscle weakness (generalized): Secondary | ICD-10-CM | POA: Diagnosis present

## 2022-01-04 DIAGNOSIS — E7800 Pure hypercholesterolemia, unspecified: Secondary | ICD-10-CM | POA: Diagnosis present

## 2022-01-04 DIAGNOSIS — Z79624 Long term (current) use of inhibitors of nucleotide synthesis: Secondary | ICD-10-CM | POA: Diagnosis not present

## 2022-01-04 DIAGNOSIS — E871 Hypo-osmolality and hyponatremia: Secondary | ICD-10-CM | POA: Diagnosis present

## 2022-01-04 DIAGNOSIS — Z79899 Other long term (current) drug therapy: Secondary | ICD-10-CM | POA: Diagnosis not present

## 2022-01-04 DIAGNOSIS — Y830 Surgical operation with transplant of whole organ as the cause of abnormal reaction of the patient, or of later complication, without mention of misadventure at the time of the procedure: Secondary | ICD-10-CM | POA: Diagnosis present

## 2022-01-04 DIAGNOSIS — J4489 Other specified chronic obstructive pulmonary disease: Secondary | ICD-10-CM | POA: Diagnosis present

## 2022-01-04 DIAGNOSIS — Z8249 Family history of ischemic heart disease and other diseases of the circulatory system: Secondary | ICD-10-CM | POA: Diagnosis not present

## 2022-01-04 DIAGNOSIS — E669 Obesity, unspecified: Secondary | ICD-10-CM | POA: Diagnosis present

## 2022-01-04 DIAGNOSIS — E1165 Type 2 diabetes mellitus with hyperglycemia: Secondary | ICD-10-CM | POA: Diagnosis present

## 2022-01-04 DIAGNOSIS — R29898 Other symptoms and signs involving the musculoskeletal system: Secondary | ICD-10-CM | POA: Diagnosis not present

## 2022-01-04 DIAGNOSIS — M069 Rheumatoid arthritis, unspecified: Secondary | ICD-10-CM | POA: Diagnosis present

## 2022-01-04 DIAGNOSIS — N281 Cyst of kidney, acquired: Secondary | ICD-10-CM | POA: Diagnosis present

## 2022-01-04 DIAGNOSIS — I10 Essential (primary) hypertension: Secondary | ICD-10-CM | POA: Diagnosis present

## 2022-01-04 DIAGNOSIS — Z833 Family history of diabetes mellitus: Secondary | ICD-10-CM | POA: Diagnosis not present

## 2022-01-04 DIAGNOSIS — G720 Drug-induced myopathy: Secondary | ICD-10-CM | POA: Diagnosis present

## 2022-01-04 LAB — GLUCOSE, CAPILLARY
Glucose-Capillary: 257 mg/dL — ABNORMAL HIGH (ref 70–99)
Glucose-Capillary: 242 mg/dL — ABNORMAL HIGH (ref 70–99)
Glucose-Capillary: 263 mg/dL — ABNORMAL HIGH (ref 70–99)
Glucose-Capillary: 218 mg/dL — ABNORMAL HIGH (ref 70–99)

## 2022-01-04 LAB — CBC
HCT: 26.5 % — ABNORMAL LOW (ref 36.0–46.0)
Hemoglobin: 8.9 g/dL — ABNORMAL LOW (ref 12.0–15.0)
MCH: 25.1 pg — ABNORMAL LOW (ref 26.0–34.0)
MCHC: 33.6 g/dL (ref 30.0–36.0)
MCV: 74.6 fL — ABNORMAL LOW (ref 80.0–100.0)
Platelets: 109 10*3/uL — ABNORMAL LOW (ref 150–400)
RBC: 3.55 MIL/uL — ABNORMAL LOW (ref 3.87–5.11)
RDW: 14.5 % (ref 11.5–15.5)
WBC: 7 10*3/uL (ref 4.0–10.5)
nRBC: 0 % (ref 0.0–0.2)

## 2022-01-04 LAB — BASIC METABOLIC PANEL
Anion gap: 12 (ref 5–15)
BUN: 49 mg/dL — ABNORMAL HIGH (ref 8–23)
CO2: 21 mmol/L — ABNORMAL LOW (ref 22–32)
Calcium: 9.3 mg/dL (ref 8.9–10.3)
Chloride: 96 mmol/L — ABNORMAL LOW (ref 98–111)
Creatinine, Ser: 2.56 mg/dL — ABNORMAL HIGH (ref 0.44–1.00)
GFR, Estimated: 20 mL/min — ABNORMAL LOW (ref >60)
Glucose, Bld: 285 mg/dL — ABNORMAL HIGH (ref 70–99)
Potassium: 3.3 mmol/L — ABNORMAL LOW (ref 3.5–5.1)
Sodium: 129 mmol/L — ABNORMAL LOW (ref 135–145)

## 2022-01-04 LAB — EXPECTORATED SPUTUM ASSESSMENT W GRAM STAIN, RFLX TO RESP C

## 2022-01-04 LAB — HIV ANTIBODY (ROUTINE TESTING W REFLEX): HIV Screen 4th Generation wRfx: NONREACTIVE

## 2022-01-04 MED ORDER — POTASSIUM CHLORIDE CRYS ER 20 MEQ PO TBCR
40.0000 meq | EXTENDED_RELEASE_TABLET | Freq: Once | ORAL | Status: AC
  Administered 2022-01-04: 40 meq via ORAL
  Filled 2022-01-04: qty 2

## 2022-01-04 MED ORDER — CINACALCET HCL 30 MG PO TABS
30.0000 mg | ORAL_TABLET | Freq: Every day | ORAL | Status: DC
  Administered 2022-01-04 – 2022-01-06 (×3): 30 mg via ORAL
  Filled 2022-01-04 (×5): qty 1

## 2022-01-04 MED ORDER — CEFDINIR 300 MG PO CAPS
300.0000 mg | ORAL_CAPSULE | Freq: Every day | ORAL | Status: DC
  Administered 2022-01-04 – 2022-01-06 (×3): 300 mg via ORAL
  Filled 2022-01-04 (×3): qty 1

## 2022-01-04 MED ORDER — MYCOPHENOLATE SODIUM 180 MG PO TBEC
360.0000 mg | DELAYED_RELEASE_TABLET | Freq: Two times a day (BID) | ORAL | Status: DC
  Administered 2022-01-04 – 2022-01-06 (×4): 360 mg via ORAL
  Filled 2022-01-04 (×5): qty 2

## 2022-01-04 MED ORDER — PREDNISONE 10 MG PO TABS
5.0000 mg | ORAL_TABLET | Freq: Every day | ORAL | Status: DC
  Administered 2022-01-05 – 2022-01-06 (×2): 5 mg via ORAL
  Filled 2022-01-04 (×2): qty 1

## 2022-01-04 MED ORDER — SODIUM CHLORIDE 0.9 % IV BOLUS
500.0000 mL | Freq: Once | INTRAVENOUS | Status: AC
  Administered 2022-01-04: 500 mL via INTRAVENOUS

## 2022-01-04 MED ORDER — INSULIN GLARGINE-YFGN 100 UNIT/ML ~~LOC~~ SOLN
5.0000 [IU] | Freq: Every day | SUBCUTANEOUS | Status: DC
  Administered 2022-01-04: 5 [IU] via SUBCUTANEOUS
  Filled 2022-01-04 (×2): qty 0.05

## 2022-01-04 MED ORDER — TACROLIMUS ER 4 MG PO TB24
9.0000 mg | ORAL_TABLET | Freq: Every day | ORAL | Status: DC
  Administered 2022-01-05 – 2022-01-06 (×2): 9 mg via ORAL
  Filled 2022-01-04 (×3): qty 1

## 2022-01-04 MED ORDER — ROSUVASTATIN CALCIUM 20 MG PO TABS: 20.0000 mg | ORAL_TABLET | Freq: Every day | ORAL | Status: DC

## 2022-01-04 MED ORDER — TACROLIMUS ER 4 MG PO TB24
9.0000 mg | ORAL_TABLET | Freq: Every day | ORAL | Status: DC
  Administered 2022-01-04: 9 mg via ORAL
  Filled 2022-01-04: qty 1

## 2022-01-04 MED ORDER — GUAIFENESIN-DM 100-10 MG/5ML PO SYRP
5.0000 mL | ORAL_SOLUTION | Freq: Four times a day (QID) | ORAL | Status: DC | PRN
  Administered 2022-01-04 – 2022-01-06 (×7): 5 mL via ORAL
  Filled 2022-01-04 (×7): qty 10

## 2022-01-04 MED ORDER — SODIUM CHLORIDE 0.9 % IV BOLUS
1500.0000 mL | Freq: Once | INTRAVENOUS | Status: AC
  Administered 2022-01-04: 1500 mL via INTRAVENOUS

## 2022-01-04 MED ORDER — ROSUVASTATIN CALCIUM 20 MG PO TABS
20.0000 mg | ORAL_TABLET | Freq: Every day | ORAL | Status: DC
  Administered 2022-01-04 – 2022-01-06 (×3): 20 mg via ORAL
  Filled 2022-01-04 (×3): qty 1

## 2022-01-04 MED ORDER — SODIUM CHLORIDE 0.9 % IV SOLN: INTRAVENOUS | Status: AC

## 2022-01-04 MED ORDER — FLUTICASONE FUROATE-VILANTEROL 200-25 MCG/ACT IN AEPB
1.0000 | INHALATION_SPRAY | Freq: Every day | RESPIRATORY_TRACT | Status: DC
  Administered 2022-01-04 – 2022-01-06 (×3): 1 via RESPIRATORY_TRACT
  Filled 2022-01-04: qty 28

## 2022-01-04 NOTE — Progress Notes (Signed)
PHARMACY NOTE:  ANTIMICROBIAL RENAL DOSAGE ADJUSTMENT  Current antimicrobial regimen includes a mismatch between antimicrobial dosage and estimated renal function.  As per policy approved by the Pharmacy & Therapeutics and Medical Executive Committees, the antimicrobial dosage will be adjusted accordingly.  Current antimicrobial dosage:  Cefdinir 300mg  BID  Indication: UTI  Renal Function:  Estimated Creatinine Clearance: 21.9 mL/min (A) (by C-G formula based on SCr of 2.56 mg/dL (H)). []      On intermittent HD, scheduled: []      On CRRT    Antimicrobial dosage has been changed to:  Cefdinir 300mg  qday  Additional comments:   Onnie Boer, PharmD, BCIDP, AAHIVP, CPP Infectious Disease Pharmacist 01/04/2022 11:37 AM

## 2022-01-04 NOTE — Progress Notes (Signed)
HD#0 Subjective:  Overnight Events: none  Patient seen at bedside with her husband.  She said that her symptoms are improving slowly.  She reports finishing her 5 days of prednisone 40 mg last Wednesday.  Her symptoms developed on Thursday.  She reports weakness of bilateral lower extremities, symptoms are consistent bilaterally, her main weakness are at her knee joints.  Weakness started from the bilateral ankles and travel up.  Reports adherence to her immunomodulators secondary to kidney transplant.  No recent dose change.  She states that her UTI symptom has improved.  Objective:  Vital signs in last 24 hours: Vitals:   01/03/22 2300 01/04/22 0019 01/04/22 0403 01/04/22 0728  BP: 117/78 114/64 114/65 129/60  Pulse: 95 97 98 97  Resp: 20 17 16 16   Temp: 98.1 F (36.7 C) 99 F (37.2 C) 99.3 F (37.4 C) 99.8 F (37.7 C)  TempSrc: Oral Oral Oral Oral  SpO2: 98% 98% 98% 96%  Weight:      Height:       Supplemental O2: Room Air SpO2: 96 %   Physical Exam:  Physical Exam Constitutional:      General: She is not in acute distress.    Appearance: She is not ill-appearing.  Eyes:     General:        Right eye: No discharge.        Left eye: No discharge.     Conjunctiva/sclera: Conjunctivae normal.  Cardiovascular:     Rate and Rhythm: Normal rate and regular rhythm.     Heart sounds: Murmur (3/6 systolic murmur heard best left sternal border) heard.  Pulmonary:     Effort: Pulmonary effort is normal. No respiratory distress.     Breath sounds: Normal breath sounds. No wheezing.  Abdominal:     General: Bowel sounds are normal. There is no distension.     Palpations: Abdomen is soft.     Tenderness: There is no abdominal tenderness.  Musculoskeletal:        General: Normal range of motion.  Skin:    General: Skin is warm.  Neurological:     Mental Status: She is alert.     Comments: With active muscle strength testing: +2 reflex of bilat patella and achilles   2-3/5 strength of bilat hip flexors and extensors 2/5 strength of bilat dorsiflexion and plantar flexion Sensation intact bilat  Patient was able to move herself back to bed and bend her knee to push herself up the bed without assistance.    Psychiatric:        Mood and Affect: Mood normal.     Filed Weights   01/03/22 1510  Weight: 84.4 kg     Intake/Output Summary (Last 24 hours) at 01/04/2022 1117 Last data filed at 01/04/2022 0931 Gross per 24 hour  Intake 1340 ml  Output 350 ml  Net 990 ml   Net IO Since Admission: 990 mL [01/04/22 1117]  Pertinent Labs:    Latest Ref Rng & Units 01/04/2022    9:50 AM 01/03/2022    4:33 PM 12/19/2019   10:25 AM  CBC  WBC 4.0 - 10.5 K/uL 7.0  7.6  4.4   Hemoglobin 12.0 - 15.0 g/dL 8.9  9.4  11.4   Hematocrit 36.0 - 46.0 % 26.5  28.5  35.4   Platelets 150 - 400 K/uL 109  114  145        Latest Ref Rng & Units 01/04/2022  9:50 AM 01/03/2022    4:33 PM 06/17/2020   10:03 AM  CMP  Glucose 70 - 99 mg/dL 285  342  117   BUN 8 - 23 mg/dL 49  50  27   Creatinine 0.44 - 1.00 mg/dL 2.56  2.73  1.07   Sodium 135 - 145 mmol/L 129  129  143   Potassium 3.5 - 5.1 mmol/L 3.3  3.7  4.4   Chloride 98 - 111 mmol/L 96  93  104   CO2 22 - 32 mmol/L 21  22  21    Calcium 8.9 - 10.3 mg/dL 9.3  9.2  10.4   Total Protein 6.5 - 8.1 g/dL  5.9  7.6   Total Bilirubin 0.3 - 1.2 mg/dL  1.0  0.3   Alkaline Phos 38 - 126 U/L  85  385   AST 15 - 41 U/L  18  20   ALT 0 - 44 U/L  20  22     Imaging: DG Chest 2 View  Result Date: 01/03/2022 CLINICAL DATA:  Cough. Kidney transplant in June. Increased weakness and frequency of urination over the last 2 weeks. EXAM: CHEST - 2 VIEW COMPARISON:  12/26/2021. FINDINGS: Cardiac silhouette is normal in size and configuration. No mediastinal or hilar masses. No evidence of adenopathy. Clear lungs.  No pleural effusion or pneumothorax. Skeletal structures are intact. IMPRESSION: No active cardiopulmonary  disease. Electronically Signed   By: Lajean Manes M.D.   On: 01/03/2022 14:56    Assessment/Plan:   Principal Problem:   Weakness Active Problems:   Essential hypertension, benign   Type 2 diabetes mellitus (HCC)   Cough   ESRD (end stage renal disease) (HCC)   Immunosuppressive management encounter following kidney transplant   Acute kidney injury [facility])   Patient Summary: Rachael George is a 66 y/o female with past medical history of HTN, HLD, T2DM, RA, ESRD s/p R renal transplant (07/2019), GERD, OSA (not on CPAP), asthma, and obesity that presents w/ 2 weeks of bilateral LE weakness and was admitted for further evaluation of her weakness.    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  Low suspicion for nerve damage with normal reflexes bilaterally.  This is more likely to be a muscle weakness issue.  My active physical exam showed that she has only 2/5 muscle strength of bilateral lower extremities.  Patient however can move herself back to bed and bend her knee to push her towards the head of the bed without assistance after my exam. -Overall, I think patient is doing better and she will benefit from PT/OT  -Continue home prednisone 5 mg to avoid adrenal insufficiency   AKI ESRD s/p R renal transplant Baseline creatinine appears to be near 1.2-1.5.  Creatinine elevated at 2.73 on admission and trending down to 2.56.  She is following up with [facility].  We will consult nephrology to help with dosing of her immunosuppressive agents. - Avoid nephrotoxic agents - Trend BMP  UTI Patient received 1 dose of IV Rocephin yesterday symptoms are improving.  Will continue 4 more days of cefdinir. She has penicillin allergy.  Avoid Bactrim given her AKI.   Post-COVID cough syndrome Asthma Lungs exam sounds clear.  She could have a mild chronic bronchitis secondary to COVID.   Respiratory Gram stain grew polymicrobial but no consolidation on chest x-ray, likely normal colonization.  Low suspicion for bacterial  pneumonia. - Albuterol nebulizer - Breo Ellipta inhaler - Robitussin DM PRN    T2DM A1C of 8.4.  Home medications only Trulicity. - SSI (sensitive) w/ HS coverage - CBG monitoring    HTN -Holding home amlodipine, HCTZ, Coreg in the setting of low normal blood pressure and AKI.  HLD - Home rosuvastatin 20 mg   GERD - Home pantoprazole 40 mg  Diet: Heart Healthy/carb IVF: None,None VTE: Heparin Code: Full PT/OT recs: Pending, none. TOC recs:   Dispo: Anticipated discharge to Home in 2 days pending improvement of weakness and AKI.   Gaylan Gerold, DO 01/04/2022, 11:17 AM Pager: [phone]  Please contact the on call pager after 5 pm and on weekends at [phone].

## 2022-01-04 NOTE — Evaluation (Signed)
Occupational Therapy Evaluation Patient Details Name: Rachael George MRN: [id number] DOB: Apr 15, 1955 Today's Date: 01/04/2022   History of Present Illness 66 y/o female that presents 01/03/22 with 2 weeks of bilateral LE weakness.  Suspect that her LE weakness is secondary to steroid-induced myopathy in the setting of increased steroid dose and decreased physical activity vs UTI;  PMH  HTN, HLD, T2DM, RA, ESRD s/p R renal transplant (07/2019), GERD, OSA (not on CPAP), asthma, and obesity   Clinical Impression   PTA, pt was independent and 3 teenage grandchildren lived with her. Upon eval, pt performing UB ADL with set-up and LB ADL with min A +2. Pt with max use of compensatory techniques for LB ADL including using UE to bring LE into figure 4 position to don socks. Pt requiring min A +2 for sit<>stand transfers and was unable to take steps this session. Due to BLE weakness and inability to perform ADL without physical assist, recommending SNF for continued OT services.      Recommendations for follow up therapy are one component of a multi-disciplinary discharge planning process, led by the attending physician.  Recommendations may be updated based on patient status, additional functional criteria and insurance authorization.   Follow Up Recommendations  Skilled nursing-short term rehab (<3 hours/day)    Assistance Recommended at Discharge Frequent or constant Supervision/Assistance  Patient can return home with the following Two people to help with walking and/or transfers;A lot of help with bathing/dressing/bathroom;Assistance with cooking/housework;Assist for transportation;Help with stairs or ramp for entrance    Functional Status Assessment  Patient has had a recent decline in their functional status and demonstrates the ability to make significant improvements in function in a reasonable and predictable amount of time.  Equipment Recommendations  Other (comment) (TBD next venue)     Recommendations for Other Services       Precautions / Restrictions Precautions Precautions: Fall Restrictions Weight Bearing Restrictions: No      Mobility Bed Mobility Overal bed mobility: Needs Assistance Bed Mobility: Rolling, Sidelying to Sit, Sit to Supine Rolling: Supervision Sidelying to sit: Min guard, HOB elevated   Sit to supine: Min assist, HOB elevated   General bed mobility comments: vc for use of rail to roll; close guarding to come to sit for safety; min assist (light) to lift legs up onto bed with return to supine    Transfers Overall transfer level: Needs assistance Equipment used: 2 person hand held assist Transfers: Sit to/from Stand Sit to Stand: Min assist, +2 physical assistance           General transfer comment: knees NOT blocked but close guarding for safety;      Balance Overall balance assessment: Needs assistance Sitting-balance support: No upper extremity supported, Feet supported Sitting balance-Leahy Scale: Good Sitting balance - Comments: able to figure 4 (UEs assisting LE into postion) and then don socks   Standing balance support: No upper extremity supported Standing balance-Leahy Scale: Fair                             ADL either performed or assessed with clinical judgement   ADL Overall ADL's : Needs assistance/impaired Eating/Feeding: Modified independent;Sitting   Grooming: Set up;Sitting   Upper Body Bathing: Set up;Sitting   Lower Body Bathing: Minimal assistance;+2 for physical assistance;Sit to/from stand;+2 for safety/equipment   Upper Body Dressing : Set up;Sitting   Lower Body Dressing: Minimal assistance;+2 for physical assistance;+2 for safety/equipment;Sit  to/from stand Lower Body Dressing Details (indicate cue type and reason): donning socks sitting EOB with supervision. Using UE to bring LE into figure 4 position Toilet Transfer: Minimal assistance;+2 for physical assistance;+2 for  safety/equipment Toilet Transfer Details (indicate cue type and reason): sit<>stand only         Functional mobility during ADLs: Minimal assistance;+2 for physical assistance;+2 for safety/equipment General ADL Comments: Min A +2 for sit<>stand. Unable to take steps. Pt able to pivot feet to move toward [facility] with min+2     Vision Baseline Vision/History: 0 No visual deficits Ability to See in Adequate Light: 0 Adequate Patient Visual Report: No change from baseline Vision Assessment?: No apparent visual deficits Additional Comments: WFL for tasks assessed     Perception     Praxis      Pertinent Vitals/Pain Pain Assessment Pain Assessment: No/denies pain     Hand Dominance Left   Extremity/Trunk Assessment Upper Extremity Assessment Upper Extremity Assessment: Generalized weakness   Lower Extremity Assessment Lower Extremity Assessment: Defer to PT evaluation RLE Deficits / Details: hip flexion 3/5, hip extension ~3+/5 (stronger with sit to stand than with extension in supine), knee flexion 3/5, knee extension 3+/5 (better functionally), ankle DF 5/5 RLE Sensation: WNL LLE Deficits / Details: hip flexion 3/5, hip extension ~3+/5 (stronger with sit to stand than with extension in supine), knee flexion 3/5, knee extension 3+/5 (better functionally), ankle DF 5/5 LLE Sensation: WNL   Cervical / Trunk Assessment Cervical / Trunk Assessment: Other exceptions Cervical / Trunk Exceptions: obese   Communication Communication Communication: No difficulties   Cognition Arousal/Alertness: Awake/alert Behavior During Therapy: Flat affect Overall Cognitive Status: Within Functional Limits for tasks assessed                                       General Comments  functional strength of LE appeared better than MMT    Exercises     Shoulder Instructions      Home Living Family/patient expects to be discharged to:: Private residence Living Arrangements:  Other relatives Available Help at Discharge: Family;Available 24 hours/day Type of Home: Mobile home Home Access: Stairs to enter Entrance Stairs-Number of Steps: 4 Entrance Stairs-Rails: Can reach both;Right;Left Home Layout: One level     Bathroom Shower/Tub: Tub/shower unit;Walk-in shower (walk in with door)   Bathroom Toilet: Standard     Home Equipment: Conservation officer, nature (2 wheels);Cane - single point;BSC/3in1;Wheelchair - manual;Shower seat          Prior Functioning/Environment Prior Level of Function : Independent/Modified Independent;Driving             Mobility Comments: no device PTA ADLs Comments: Independent PTA        OT Problem List: Decreased strength;Decreased activity tolerance;Impaired balance (sitting and/or standing);Pain;Obesity      OT Treatment/Interventions: Self-care/ADL training;Therapeutic exercise;DME and/or AE instruction;Therapeutic activities;Patient/family education;Balance training    OT Goals(Current goals can be found in the care plan section) Acute Rehab OT Goals Patient Stated Goal: get better OT Goal Formulation: With patient Time For Goal Achievement: 01/18/22 Potential to Achieve Goals: Good  OT Frequency: Min 2X/week    Co-evaluation              AM-PAC OT "6 Clicks" Daily Activity     Outcome Measure Help from another person eating meals?: None Help from another person taking care of personal grooming?: A Little Help from  another person toileting, which includes using toliet, bedpan, or urinal?: A Lot Help from another person bathing (including washing, rinsing, drying)?: A Lot Help from another person to put on and taking off regular upper body clothing?: A Little Help from another person to put on and taking off regular lower body clothing?: A Little 6 Click Score: 17   End of Session Equipment Utilized During Treatment: Gait belt Nurse Communication: Mobility status  Activity Tolerance: Patient tolerated  treatment well;Patient limited by lethargy (had recently had cough medication that makes her drowsy) Patient left: in bed;with call bell/phone within reach;with bed alarm set  OT Visit Diagnosis: Unsteadiness on feet (R26.81);Muscle weakness (generalized) (M62.81);Pain;Other abnormalities of gait and mobility (R26.89) Pain - part of body:  (B legs)                Time: 9563-8756 OT Time Calculation (min): 23 min Charges:  OT General Charges $OT Visit: 1 Visit OT Evaluation $OT Eval Low Complexity: 1 Low  Rachael George, OTR/L [facility] Acute Rehabilitation Office: [phone]   Lula Olszewski 01/04/2022, 2:52 PM

## 2022-01-04 NOTE — Evaluation (Signed)
Physical Therapy Evaluation Patient Details Name: Rachael George MRN: [id number] DOB: [birth year] Today's Date: 01/04/2022  History of Present Illness  66 y/o female that presents 01/03/22 with 2 weeks of bilateral LE weakness.  Suspect that her LE weakness is secondary to steroid-induced myopathy in the setting of increased steroid dose and decreased physical activity vs UTI;  PMH  HTN, HLD, T2DM, RA, ESRD s/p R renal transplant (07/2019), GERD, OSA (not on CPAP), asthma, and obesity  Clinical Impression   Pt admitted secondary to problem above with deficits below. PTA patient was living with grandchildren with nearly 24/7 availability. She did not use any assistive devices and was independent with all basic mobility.  Pt currently requires +2 min assist to stand at EOB and to "shimmy" her feet to move laterally along EOB. Despite ability to perform mini-squats in standing and able to lift legs back up onto mattress with only very light min assist, pt reported she could not advance either leg to take a step. Currently recommending AIR, however feel pt may make quick progress and, if so, will consider home with HHPT vs OPPT.  Anticipate patient will benefit from PT to address problems listed below.Will continue to follow acutely to maximize functional mobility independence and safety.          Recommendations for follow up therapy are one component of a multi-disciplinary discharge planning process, led by the attending physician.  Recommendations may be updated based on patient status, additional functional criteria and insurance authorization.  Follow Up Recommendations Acute inpatient rehab (3hours/day)      Assistance Recommended at Discharge Frequent or constant Supervision/Assistance  Patient can return home with the following  Two people to help with walking and/or transfers;Assistance with cooking/housework;Assist for transportation;Help with stairs or ramp for entrance    Equipment  Recommendations None recommended by PT  Recommendations for Other Services  Rehab consult    Functional Status Assessment Patient has had a recent decline in their functional status and demonstrates the ability to make significant improvements in function in a reasonable and predictable amount of time.     Precautions / Restrictions Precautions Precautions: Fall      Mobility  Bed Mobility Overal bed mobility: Needs Assistance Bed Mobility: Rolling, Sidelying to Sit, Sit to Supine Rolling: Supervision Sidelying to sit: Min guard, HOB elevated   Sit to supine: Min assist, HOB elevated   General bed mobility comments: vc for use of rail to roll; close guarding to come to sit for safety; min assist (light) to lift legs up onto bed with return to supine    Transfers Overall transfer level: Needs assistance Equipment used: 2 person hand held assist Transfers: Sit to/from Stand Sit to Stand: Min assist, +2 physical assistance           General transfer comment: knees NOT blocked but close guarding for safety;    Ambulation/Gait             Pre-gait activities: able to complete mini-squat x 3 reps with good control, when asked to side-step she "shimmied" on both legs to her left toward [facility] General Gait Details: reports legs too weak to advance either foot  Stairs            Wheelchair Mobility    Modified Rankin (Stroke Patients Only)       Balance Overall balance assessment: Needs assistance Sitting-balance support: No upper extremity supported, Feet supported Sitting balance-Leahy Scale: Good Sitting balance - Comments: able to figure  4 (UEs assisting LE into postion) and then don socks   Standing balance support: No upper extremity supported Standing balance-Leahy Scale: Fair                               Pertinent Vitals/Pain Pain Assessment Pain Assessment: No/denies pain    Home Living Family/patient expects to be discharged to::  Private residence Living Arrangements: Other relatives (grandchildren 74, 49, 20) Available Help at Discharge: Family;Available 24 hours/day Type of Home: Mobile home Home Access: Stairs to enter Entrance Stairs-Rails: Can reach both;Right;Left Entrance Stairs-Number of Steps: 4   Home Layout: One level Home Equipment: Conservation officer, nature (2 wheels);Cane - single point;BSC/3in1;Wheelchair - manual;Shower seat      Prior Function Prior Level of Function : Independent/Modified Independent;Driving             Mobility Comments: no device PTA       Hand Dominance        Extremity/Trunk Assessment   Upper Extremity Assessment Upper Extremity Assessment: Defer to OT evaluation    Lower Extremity Assessment Lower Extremity Assessment: RLE deficits/detail;LLE deficits/detail RLE Deficits / Details: hip flexion 3/5, hip extension ~3+/5 (stronger with sit to stand than with extension in supine), knee flexion 3/5, knee extension 3+/5 (better functionally), ankle DF 5/5 RLE Sensation: WNL LLE Deficits / Details: hip flexion 3/5, hip extension ~3+/5 (stronger with sit to stand than with extension in supine), knee flexion 3/5, knee extension 3+/5 (better functionally), ankle DF 5/5 LLE Sensation: WNL    Cervical / Trunk Assessment Cervical / Trunk Assessment: Other exceptions Cervical / Trunk Exceptions: obese  Communication   Communication: No difficulties  Cognition Arousal/Alertness: Awake/alert Behavior During Therapy: Flat affect Overall Cognitive Status: Within Functional Limits for tasks assessed                                          General Comments General comments (skin integrity, edema, etc.): Functional strength appeared better than MMT.    Exercises Other Exercises Other Exercises: encouraged to continue AROM and  especially ankle pumps for ankle strength and flexibility   Assessment/Plan    PT Assessment Patient needs continued PT services   PT Problem List Decreased strength;Decreased activity tolerance;Decreased balance;Decreased mobility;Decreased knowledge of use of DME;Obesity       PT Treatment Interventions DME instruction;Gait training;Stair training;Functional mobility training;Therapeutic activities;Therapeutic exercise;Balance training;Patient/family education    PT Goals (Current goals can be found in the Care Plan section)  Acute Rehab PT Goals Patient Stated Goal: go straight home and not to rehab PT Goal Formulation: With patient Time For Goal Achievement: 01/18/22 Potential to Achieve Goals: Good    Frequency Min 3X/week     Co-evaluation               AM-PAC PT "6 Clicks" Mobility  Outcome Measure Help needed turning from your back to your side while in a flat bed without using bedrails?: A Little Help needed moving from lying on your back to sitting on the side of a flat bed without using bedrails?: A Little Help needed moving to and from a bed to a chair (including a wheelchair)?: Total Help needed standing up from a chair using your arms (e.g., wheelchair or bedside chair)?: Total Help needed to walk in hospital room?: Total Help needed climbing 3-5 steps with  a railing? : Total 6 Click Score: 10    End of Session Equipment Utilized During Treatment: Gait belt Activity Tolerance: Patient tolerated treatment well Patient left: in bed;with call bell/phone within reach;with bed alarm set Nurse Communication: Mobility status PT Visit Diagnosis: Other abnormalities of gait and mobility (R26.89);Muscle weakness (generalized) (M62.81)    Time: 9211-9417 PT Time Calculation (min) (ACUTE ONLY): 20 min   Charges:   PT Evaluation $PT Eval Low Complexity: [place], PT Acute Rehabilitation Services  Office [phone]   Rexanne Mano 01/04/2022, 1:30 PM

## 2022-01-04 NOTE — Progress Notes (Signed)
Inpatient Rehab Admissions Coordinator:   Per therapy recommendations, patient was screened for CIR candidacy by Clemens Catholic, MS, CCC-SLP. At this time, Pt. Remains observation status and does not appear to demonstrate medical necessity to justify in hospital rehabilitation/CIR. I will not pursue a rehab consult for this Pt.   Recommend other rehab venues to be pursued.  Please contact me with any questions.  Clemens Catholic, [place], [place] Admissions Coordinator  [phone] ([place]) [phone] (office)

## 2022-01-04 NOTE — Consult Note (Signed)
Renal Service Consult Note [facility]  Rachael George 01/04/2022 Rachael Blazing, MD Requesting Physician: Dr. Lenise Herald  Reason for Consult: Renal failure, renal transplant HPI: The patient is a 66 y.o. year-old w/ hx of anemia, ESRD sp renal transplant (2021 at [facility]), gout, HL, HTN, hypothyroid, hx gastric ulcers, RA, OSA who presented to ED 10/14 reporting gen'd weakness, inability to ambulate and dark urine progressive for the past 2 weeks. In ED BP's soft 100-110 range, HR 90s , afebrile. Labs showed creat 2.7 (b/l creat 1-1.5).  CXR negative. UA + LE/ bacteria/ WBC's. Started on IV rocephin. Seen by medical team. She endorsed prod cough for several wks, also was dx'd COVID+ 7 wks ago and has been coughing since then and sometimes the coughing causes her to vomit. Has had some SOB/ wheezing which responds to home albuterol. Hx of asthma. She is f/b Dr Posey Pronto at [facility] who she sees every 3 mos, last seen in September. We are asked to see for renal failure/ transplant care.   Pt seen in room. Confirms +covid symptoms 7 wks ago, coughing and vomiting for last several weeks. And took prednisone 40 mg / d for a short course as well.   ROS - denies CP, no joint pain, no HA, no blurry vision, no rash, no diarrhea, no nausea/ vomiting, no dysuria, no difficulty voiding   Past Medical History  Past Medical History:  Diagnosis Date   Anemia    Asthma    ESRD (end stage renal disease) ([place])    MWF   GERD (gastroesophageal reflux disease)    Gout    Heart murmur    born with it- nothing to worry about   Heavy menstrual bleeding    High cholesterol    History of blood transfusion 02/2016- last one   1990's- several ones    History of hiatal hernia    History of kidney stones 1980's   Hyperlipemia 12/07/2012   Hypertension    Hypothyroidism    Multiple gastric ulcers    Pneumonia 2012   Pre-diabetes    Prediabetes    RA (rheumatoid arthritis) ([place])    Sleep apnea     [place]- have CPAP, not able to use every night    Past Surgical History  Past Surgical History:  Procedure Laterality Date   A/V SHUNTOGRAM N/A 09/08/2016   Procedure: A/V Shuntogram - Left Arm AV Graft;  Surgeon: Serafina Mitchell, MD;  Location: [place] CV [facility];  Service: Cardiovascular;  Laterality: N/A;   A/V SHUNTOGRAM N/A 09/02/2017   Procedure: A/V SHUNTOGRAM - Left Arm AVG;  Surgeon: Conrad [place], MD;  Location: [place] [facility];  Service: Cardiovascular;  Laterality: N/A;   A/V SHUNTOGRAM N/A 12/21/2017   Procedure: A/V SHUNTOGRAM - left arm;  Surgeon: Serafina Mitchell, MD;  Location: [place] [facility];  Service: Cardiovascular;  Laterality: N/A;   A/V SHUNTOGRAM Left 10/05/2018   Procedure: A/V SHUNTOGRAM;  Surgeon: Marty Heck, MD;  Location: [place] CV LAB;  Service: Cardiovascular;  Laterality: Left;   ABDOMINAL HYSTERECTOMY  2000   AV FISTULA PLACEMENT Left 05/25/2016   Procedure: INSERTION OF LEFT UPPER ARM ARTERIOVENOUS (AV) LOOP GORE-TEX GRAFT ARM;  Surgeon: Elam Dutch, MD;  Location: [place];  Service: Vascular;  Laterality: Left;   [place] Right 12/06/2014   Procedure: FIRST STAGE BASILIC VEIN TRANSPOSITION - RIGHT;  Surgeon: Serafina Mitchell, MD;  Location: [place];  Service: Vascular;  Laterality: Right;   BASCILIC VEIN TRANSPOSITION Right 02/07/2015   Procedure: RIGHT ARM SECOND STAGE BASILIC VEIN TRANSPOSITION;  Surgeon: Serafina Mitchell, MD;  Location: [place];  Service: Vascular;  Laterality: Right;   [place] Left 04/22/2016   Procedure: FIRST STAGE BASILIC VEIN TRANSPOSITION LEFT ARM;  Surgeon: Conrad [place], MD;  Location: [place];  Service: Vascular;  Laterality: Left;   COLONOSCOPY W/ POLYPECTOMY     ECTOPIC PREGNANCY SURGERY  02/1982   EXCHANGE OF A DIALYSIS CATHETER Right 04/22/2016   Procedure: EXCHANGE OF A DIALYSIS CATHETER - INSERTION RIGHT INTERNAL JUGULAR & REMOVAL FROM LEFT INTERNAL JUGULAR;  Surgeon:  Conrad [place], MD;  Location: [place];  Service: Vascular;  Laterality: Right;   I & D EXTREMITY Right 02/07/2015   Procedure: IRRIGATION AND DEBRIDEMENT RIGHT ARM HEMATOMA;  Surgeon: Serafina Mitchell, MD;  Location: [place];  Service: Vascular;  Laterality: Right;   INSERTION OF DIALYSIS CATHETER  03/03/2016   Procedure: INSERTION OF DIALYSIS CATHETER;  Surgeon: Elam Dutch, MD;  Location: [place];  Service: Vascular;;   IR AV DIALY SHUNT INTRO NEEDLE/INTRACATH INITIAL W/PTA/IMG LEFT  04/20/2017   IR AV DIALY SHUNT INTRO NEEDLE/INTRACATH INITIAL W/PTA/IMG LEFT  06/24/2017   IR THROMBECTOMY AV FISTULA W/THROMBOLYSIS/PTA INC/SHUNT/IMG LEFT Left 01/04/2017   IR US GUIDE VASC ACCESS LEFT  01/04/2017   left shoulder surgery  08/2005   LIGATION OF ARTERIOVENOUS  FISTULA  03/03/2016   Procedure: LIGATION OF ARTERIOVENOUS  FISTULA;  Surgeon: Elam Dutch, MD;  Location: [facility] OR;  Service: Vascular;;   LIGATION OF ARTERIOVENOUS  FISTULA Right 09/06/2018   Procedure: Resection of pseudoaneursym and end to end repair of right brachial artery;  Surgeon: Elam Dutch, MD;  Location: [facility] OR;  Service: Vascular;  Laterality: Right;   PERIPHERAL VASCULAR BALLOON ANGIOPLASTY  09/08/2016   Procedure: Peripheral Vascular Balloon Angioplasty;  Surgeon: Serafina Mitchell, MD;  Location: [place] CV LAB;  Service: Cardiovascular;;  left avf   PERIPHERAL VASCULAR BALLOON ANGIOPLASTY  09/02/2017   Procedure: PERIPHERAL VASCULAR BALLOON ANGIOPLASTY;  Surgeon: Conrad [place]ian[place], MD;  Location: [place] CV LAB;  Service: Cardiovascular;;  left AV Graft   PERIPHERAL VASCULAR BALLOON ANGIOPLASTY  12/21/2017   Procedure: PERIPHERAL VASCULAR BALLOON ANGIOPLASTY;  Surgeon: Serafina Mitchell, MD;  Location: [place] CV LAB;  Service: Cardiovascular;;  INOMINATE / UPPER ARM AV GRAFT   PERIPHERAL VASCULAR BALLOON ANGIOPLASTY Left 05/12/2018   Procedure: PERIPHERAL VASCULAR BALLOON ANGIOPLASTY;  Surgeon: Marty Heck, MD;   Location: [place] CV LAB;  Service: Cardiovascular;  Laterality: Left;  Arm fistula   PERIPHERAL VASCULAR BALLOON ANGIOPLASTY Left 10/05/2018   Procedure: PERIPHERAL VASCULAR BALLOON ANGIOPLASTY;  Surgeon: Marty Heck, MD;  Location: [place] CV LAB;  Service: Cardiovascular;  Laterality: Left;  central and peripheral vein   PERIPHERAL VASCULAR BALLOON ANGIOPLASTY Left 04/18/2019   Procedure: PERIPHERAL VASCULAR BALLOON ANGIOPLASTY;  Surgeon: Serafina Mitchell, MD;  Location: [place] CV LAB;  Service: Cardiovascular;  Laterality: Left;  arm fistula    TEE WITHOUT CARDIOVERSION N/A 03/06/2016   Procedure: TRANSESOPHAGEAL ECHOCARDIOGRAM (TEE);  Surgeon: Sueanne Margarita, MD;  Location: [facility];  Service: Cardiovascular;  Laterality: N/A;   TUBAL LIGATION  11/1986   Family History  Family History  Problem Relation Age of Onset   Hypertension Mother    Breast cancer Maternal Aunt    Deep vein thrombosis Daughter  Hyperlipidemia Daughter    Hypertension Daughter    Prostate cancer Maternal Grandmother    Social History  reports that she has never smoked. She has never used smokeless tobacco. She reports current alcohol use. She reports that she does not use drugs. Allergies  Allergies  Allergen Reactions   Candesartan Anaphylaxis and Other (See Comments)    Swelling of the mouth and tongue.    Candesartan Cilexetil Anaphylaxis   Lisinopril Anaphylaxis and Other (See Comments)    Swelling of the mouth and tongue.   Nsaids Anaphylaxis and Other (See Comments)    Swelling of the mouth and tongue.   Metoprolol Tartrate Rash   Penicillins Rash and Other (See Comments)    Has patient had a PCN reaction causing immediate rash, facial/tongue/throat swelling, SOB or lightheadedness with hypotension: No Has patient had a PCN reaction causing severe rash involving mucus membranes or skin necrosis: No Has patient had a PCN reaction that required hospitalization No Has patient  had a PCN reaction occurring within the last 10 years: No If all of the above answers are "NO", then may proceed with Cephalosporin use.    Home medications Prior to Admission medications   Medication Sig Start Date End Date Taking? Authorizing Provider  acetaminophen (TYLENOL) 650 MG CR tablet Take 1,300 mg by mouth 2 (two) times daily as needed for pain.   Yes [provider]  albuterol (PROVENTIL) (2.5 MG/3ML) 0.083% nebulizer solution Take 3 mLs (2.5 mg total) by nebulization every 6 (six) hours as needed for wheezing or shortness of breath. ICD 10:J45.20 Patient taking differently: Take 2.5 mg by nebulization as needed for wheezing or shortness of breath. 05/09/19  Yes Newlin, Charlane Ferretti, MD  albuterol (VENTOLIN HFA) 108 (90 Base) MCG/ACT inhaler INHALE 2 PUFFS BY MOUTH EVERY 6 HOURS AS NEEDED FOR WHEEZE OR SHORTNESS OF BREATH Patient taking differently: Inhale 2 puffs into the lungs 4 (four) times daily as needed for wheezing or shortness of breath. 06/17/20  Yes Newlin, Enobong, MD  amLODipine (NORVASC) 5 MG tablet Take 5 mg by mouth at bedtime. 05/02/21  Yes [provider]  aspirin 81 MG EC tablet Take 81 mg by mouth daily. 07/31/19  Yes [provider]  budesonide-formoterol (SYMBICORT) 160-4.5 MCG/ACT inhaler Inhale 2 puffs into the lungs 2 (two) times daily for 1 day. 12/19/19 01/04/22 Yes Charlott Rakes, MD  carvedilol (COREG) 12.5 MG tablet Take 12.5 mg by mouth 2 (two) times daily with a meal.   Yes [provider]  Cholecalciferol 25 MCG (1000 UT) capsule Take 3,000 Units by mouth in the morning and at bedtime. 01/11/20  Yes [provider]  cinacalcet (SENSIPAR) 30 MG tablet Take 30 mg by mouth daily.   Yes [provider]  Dulaglutide (TRULICITY) 2.77 OE/4.2PN SOPN ADMINISTER 0.75 MG UNDER THE SKIN 1 TIME A WEEK Patient taking differently: Inject 0.75 mg into the skin once a week. 03/28/21  Yes Newlin, Enobong, MD  fluticasone  (FLONASE) 50 MCG/ACT nasal spray SPRAY 2 SPRAYS INTO EACH NOSTRIL EVERY DAY Patient taking differently: Place 2 sprays into both nostrils daily. 07/06/20  Yes Charlott Rakes, MD  hydrochlorothiazide (HYDRODIURIL) 12.5 MG tablet Take 12.5 mg by mouth 3 (three) times a week. 11/04/20  Yes [provider]  ipratropium-albuterol (DUONEB) 0.5-2.5 (3) MG/3ML SOLN Take 3 mLs by nebulization as needed (wheezing/shortness of breath). 12/26/21  Yes [provider]  magnesium oxide (MAG-OX) 400 MG tablet Take 400 mg by mouth daily. 09/25/19  Yes  [provider]  multivitamin (RENA-VIT) TABS tablet Take 1 tablet by mouth daily with lunch.    Yes [provider]  mycophenolate (MYFORTIC) 180 MG EC tablet Take 360 mg by mouth 2 (two) times daily. 07/01/20  Yes [provider]  pantoprazole (PROTONIX) 40 MG tablet TAKE 1 TABLET(40 MG) BY MOUTH TWICE DAILY Patient taking differently: Take 40 mg by mouth daily. 12/30/20  Yes Charlott Rakes, MD  predniSONE (DELTASONE) 5 MG tablet Take 5 mg by mouth daily with breakfast. 05/02/21  Yes [provider]  RESTASIS 0.05 % ophthalmic emulsion Place 1 drop into both eyes daily as needed (redness/floaters). 01/17/21  Yes [provider]  rosuvastatin (CRESTOR) 20 MG tablet TAKE 1 TABLET(20 MG) BY MOUTH DAILY Patient taking differently: Take 20 mg by mouth daily. 01/30/21  Yes Charlott Rakes, MD  tacrolimus ER (ENVARSUS XR) 1 MG TB24 Take 9 mg by mouth daily before breakfast. 05/07/20  Yes [provider]  Accu-Chek Softclix Lancets lancets USE AS DIRECTED TO TEST DAILY 01/13/21   Charlott Rakes, MD  amLODipine (NORVASC) 10 MG tablet TAKE 1 TABLET BY MOUTH EVERYDAY AT BEDTIME Patient not taking: Reported on 01/04/2022 11/26/19   Charlott Rakes, MD  Blood Glucose Monitoring Suppl (ACCU-CHEK GUIDE) w/Device KIT 1 each by Does not apply route daily. Use as instructed to check blood sugar once daily. E11.22, N18.6,  Z99.2 08/07/19   Charlott Rakes, MD  carvedilol (COREG) 25 MG tablet Take 1 tablet (25 mg total) by mouth 2 (two) times daily with a meal. Patient not taking: Reported on 01/04/2022 05/23/19   Charlott Rakes, MD  chlorpheniramine (CHLOR-TRIMETON) 4 MG tablet Take 1 tablet (4 mg total) by mouth 3 (three) times daily. Patient not taking: Reported on 01/04/2022 05/20/17   Marshell Garfinkel, MD  cinacalcet (SENSIPAR) 90 MG tablet Take 90 mg by mouth daily. Patient not taking: Reported on 01/04/2022    [provider]  cyclobenzaprine (FLEXERIL) 10 MG tablet Take 1 tablet (10 mg total) by mouth 2 (two) times daily as needed. For back pain Patient not taking: Reported on 01/04/2022 07/31/20   Charlott Rakes, MD  glucose blood (ACCU-CHEK GUIDE) test strip USE TO TEST BLOOD SUGAR LEVELS DAILY 01/07/21   Charlott Rakes, MD  HYDROMET 5-1.5 MG/5ML syrup Take 5 mLs by mouth every 4 (four) hours as needed. Patient not taking: Reported on 01/04/2022 12/26/21   [provider]  nystatin (MYCOSTATIN/NYSTOP) powder Apply 1 application topically 3 (three) times daily. Patient not taking: Reported on 03/18/2021 12/30/20   Charlott Rakes, MD     Vitals:   01/04/22 0403 01/04/22 0728 01/04/22 1125 01/04/22 1546  BP: 114/65 129/60 (!) 107/55 (!) 113/55  Pulse: 98 97 98 92  Resp: 16 16 15 16   Temp: 99.3 F (37.4 C) 99.8 F (37.7 C) 99 F (37.2 C) 98.1 F (36.7 C)  TempSrc: Oral Oral Oral Oral  SpO2: 98% 96% 97% 97%  Weight:      Height:       Exam Gen alert, no distress, pt is calm lying flat Looks dry overall No rash, cyanosis or gangrene Sclera anicteric, throat clear and a bit dry Flat neck veins Chest clear bilat to bases, no rales/ wheezing RRR no MRG Abd soft ntnd no mass or ascites +bs, transplant RLQ non-tender GU defer MS no joint effusions or deformity Ext no LE or UE edema, no wounds or ulcers Neuro is alert, Ox 3 , nf    Home meds include -  albuterol, aspirin,  symbicort, mycophenolate 360 bid, pantoprazole, rosuvastatin, tacrolimus ER 106m before breakfast, amlodipine 5 qd, prns/ vits/ supps     Na 129  K 3.3 CO2 21 BUN 49  creat 2.56   Ca 9.4  alb 2.1  WBC 7k  Hb 8.9    BP's here 105- 125/ 55-70, MAP 69- 81   HR 90-100  RR 16    B/l creat from 12/05/21 at [facility] was 1.28     UA 10/14 - cloudy, prot 100, large LE, mod Hb, many bact, 11-20 rbc, >50 wbc  Assessment/ Plan: AKI on CKD 3a transplant - b/l creatinine from [facility] 12/03/21 is 1.28, eGFR 53 ml/min. Creat here 2.7 at admission in setting of several weeks illness w/ coughing and vomiting and progressive gen'd weakness. UA looks infected, renal UKoreapending. Exam c/w vol depletion/ dehydration. Transplant is non-tender. Has a cough but doesn't appear toxic or septic. Suspect AKI due to vol depletion +/- borderline hypotension/ hypoperfusion. Will rebolus 2 L NS and resume IVF's at 100 cc/hr. F/u labs in am.  Renal transplant - cont usual home meds please. Currently ordered properly. Get tac level in case needed later on.  UTI - getting IV abx, cx's pending Gen'd weakness - per pmd DM2 - per pmd   RKelly Splinter MD 01/04/2022, 5:29 PM Recent Labs  Lab 01/03/22 1633 01/04/22 0950  HGB 9.4* 8.9*  ALBUMIN 2.1*  --   CALCIUM 9.2 9.3  CREATININE 2.73* 2.56*  K 3.7 3.3*   Inpatient medications:  aspirin EC  81 mg Oral Daily   cefdinir  300 mg Oral Daily   cinacalcet  30 mg Oral Q breakfast   fluticasone furoate-vilanterol  1 puff Inhalation Daily   heparin  5,000 Units Subcutaneous Q8H   insulin aspart  0-5 Units Subcutaneous QHS   insulin aspart  0-9 Units Subcutaneous TID WC   insulin glargine-yfgn  5 Units Subcutaneous Daily   mycophenolate  360 mg Oral BID   pantoprazole  40 mg Oral Daily   [START ON 01/05/2022] predniSONE  5 mg Oral Q breakfast   rosuvastatin  20 mg Oral Daily   [START ON 01/05/2022] tacrolimus ER  9 mg Oral QAC breakfast    sodium chloride 100 mL/hr at 01/04/22 1530    acetaminophen **OR** acetaminophen, albuterol, guaiFENesin-dextromethorphan, polyethylene glycol, senna

## 2022-01-05 ENCOUNTER — Encounter (HOSPITAL_COMMUNITY): Payer: Self-pay | Admitting: Internal Medicine

## 2022-01-05 ENCOUNTER — Inpatient Hospital Stay (HOSPITAL_COMMUNITY): Payer: Medicare Other

## 2022-01-05 DIAGNOSIS — R29898 Other symptoms and signs involving the musculoskeletal system: Secondary | ICD-10-CM

## 2022-01-05 LAB — CBC WITH DIFFERENTIAL/PLATELET
Abs Immature Granulocytes: 0.12 10*3/uL — ABNORMAL HIGH (ref 0.00–0.07)
Basophils Absolute: 0 10*3/uL (ref 0.0–0.1)
Basophils Relative: 0 %
Eosinophils Absolute: 0.1 10*3/uL (ref 0.0–0.5)
Eosinophils Relative: 2 %
HCT: 27.8 % — ABNORMAL LOW (ref 36.0–46.0)
Hemoglobin: 9.1 g/dL — ABNORMAL LOW (ref 12.0–15.0)
Immature Granulocytes: 2 %
Lymphocytes Relative: 6 %
Lymphs Abs: 0.5 10*3/uL — ABNORMAL LOW (ref 0.7–4.0)
MCH: 24.6 pg — ABNORMAL LOW (ref 26.0–34.0)
MCHC: 32.7 g/dL (ref 30.0–36.0)
MCV: 75.1 fL — ABNORMAL LOW (ref 80.0–100.0)
Monocytes Absolute: 0.5 10*3/uL (ref 0.1–1.0)
Monocytes Relative: 7 %
Neutro Abs: 6 10*3/uL (ref 1.7–7.7)
Neutrophils Relative %: 83 %
Platelets: 140 10*3/uL — ABNORMAL LOW (ref 150–400)
RBC: 3.7 MIL/uL — ABNORMAL LOW (ref 3.87–5.11)
RDW: 14.6 % (ref 11.5–15.5)
WBC: 7.2 10*3/uL (ref 4.0–10.5)
nRBC: 0 % (ref 0.0–0.2)

## 2022-01-05 LAB — CBC
HCT: 27.3 % — ABNORMAL LOW (ref 36.0–46.0)
Hemoglobin: 9 g/dL — ABNORMAL LOW (ref 12.0–15.0)
MCH: 24.6 pg — ABNORMAL LOW (ref 26.0–34.0)
MCHC: 33 g/dL (ref 30.0–36.0)
MCV: 74.6 fL — ABNORMAL LOW (ref 80.0–100.0)
Platelets: 128 10*3/uL — ABNORMAL LOW (ref 150–400)
RBC: 3.66 MIL/uL — ABNORMAL LOW (ref 3.87–5.11)
RDW: 14.6 % (ref 11.5–15.5)
WBC: 7 10*3/uL (ref 4.0–10.5)
nRBC: 0 % (ref 0.0–0.2)

## 2022-01-05 LAB — GLUCOSE, CAPILLARY
Glucose-Capillary: 194 mg/dL — ABNORMAL HIGH (ref 70–99)
Glucose-Capillary: 303 mg/dL — ABNORMAL HIGH (ref 70–99)
Glucose-Capillary: 263 mg/dL — ABNORMAL HIGH (ref 70–99)
Glucose-Capillary: 207 mg/dL — ABNORMAL HIGH (ref 70–99)

## 2022-01-05 LAB — TECHNOLOGIST SMEAR REVIEW: Plt Morphology: DECREASED

## 2022-01-05 LAB — BASIC METABOLIC PANEL
Anion gap: 11 (ref 5–15)
BUN: 39 mg/dL — ABNORMAL HIGH (ref 8–23)
CO2: 20 mmol/L — ABNORMAL LOW (ref 22–32)
Calcium: 9.1 mg/dL (ref 8.9–10.3)
Chloride: 105 mmol/L (ref 98–111)
Creatinine, Ser: 2.33 mg/dL — ABNORMAL HIGH (ref 0.44–1.00)
GFR, Estimated: 23 mL/min — ABNORMAL LOW (ref >60)
Glucose, Bld: 183 mg/dL — ABNORMAL HIGH (ref 70–99)
Potassium: 3.7 mmol/L (ref 3.5–5.1)
Sodium: 136 mmol/L (ref 135–145)

## 2022-01-05 MED ORDER — INSULIN GLARGINE-YFGN 100 UNIT/ML ~~LOC~~ SOLN
8.0000 [IU] | Freq: Every day | SUBCUTANEOUS | Status: DC
  Administered 2022-01-05: 8 [IU] via SUBCUTANEOUS
  Filled 2022-01-05 (×2): qty 0.08

## 2022-01-05 NOTE — Progress Notes (Signed)
[facility] Progress Note    Assessment/ Plan:   AKI on CKD3a, S/P DDKT 07/27/2019 -baseline Cr ~1-1.3. Peak Cr 2.7 on 10/14 -AKI likely secondary to prerenal injury in the context of coughing/vomiting/weakness, found to have UTI here. -Cr improved to 2.3 today. C/w supportive care for now. Will check renal txp u/s in the interim -c/w home immunosuppression, FK level to be drawn 10/16 -Avoid nephrotoxic medications including NSAIDs and iodinated intravenous contrast exposure unless the latter is absolutely indicated.  Preferred narcotic agents for pain control are hydromorphone, fentanyl, and methadone. Morphine should not be used. Avoid Baclofen and avoid oral sodium phosphate and magnesium citrate based laxatives / bowel preps. Continue strict Input and Output monitoring. 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 Per primary service  UTI -s/p rocephin, receiving cefdinir  Immunosuppression -Immunosuppression Management: High Risk Medical Decision Making For Drug Therapy Requiring Intensive Monitoring For Toxicity I/S levels: FK level pending 10/16 Will continue intensive monitoring for drug levels and toxicity via regularly scheduled laboratory assays given the narrow therapeutic index of the immunosuppressive drug therapy listed above -home maintenance: envarsus 9mg  daily, myfortic 360mg  BID, prednisone 5mg  daily -FK goal level 7-8  Anemia of CKD -hgb stable, check iron studies  Uncontrolled DM2 -per primary service  Gean Quint, MD [facility]  Subjective:   No acute events. Patient reports that the weakness in her leg is slightly better. She does report that she is eating and drinking adequately. Denies chest pain, SOB, dysuria, hematuria, pain over txp site   Objective:   BP (!) 105/52 (BP  Location: Left Wrist)   Pulse 93   Temp 99.3 F (37.4 C) (Oral)   Resp 17   Ht 5\' 1"  (1.549 m)   Wt 84.4 kg   SpO2 98%   BMI 35.14 kg/m   Intake/Output Summary (Last 24 hours) at 01/05/2022 7169 Last data filed at 01/05/2022 6789 Gross per 24 hour  Intake 1134.12 ml  Output 275 ml  Net 859.12 ml   Weight change:   Physical Exam: Gen: nad CVS: s1s2, rrr Resp: cta b/l Abd: soft, nt/nd Ext: no sig edema Neuro: awake, alert  Imaging: DG Chest 2 View  Result Date: 01/03/2022 CLINICAL DATA:  Cough. Kidney transplant in June. Increased weakness and frequency of urination over the last 2 weeks. EXAM: CHEST - 2 VIEW COMPARISON:  12/26/2021. FINDINGS: Cardiac silhouette is normal in size and configuration. No mediastinal or hilar masses. No evidence of adenopathy. Clear lungs.  No pleural effusion or pneumothorax. Skeletal structures are intact. IMPRESSION: No active cardiopulmonary disease. Electronically Signed   By: Lajean Manes M.D.   On: 01/03/2022 14:56    Labs: BMET Recent Labs  Lab 01/03/22 1633 01/04/22 0950 01/05/22 0504  NA 129* 129* 136  K 3.7 3.3* 3.7  CL 93* 96* 105  CO2 22 21* 20*  GLUCOSE 342* 285* 183*  BUN 50* 49* 39*  CREATININE 2.73* 2.56* 2.33*  CALCIUM 9.2 9.3 9.1   CBC Recent Labs  Lab 01/03/22 1633 01/04/22 0950 01/05/22 0504  WBC 7.6 7.0 7.2  7.0  NEUTROABS  --   --  6.0  HGB 9.4* 8.9* 9.1*  9.0*  HCT 28.5* 26.5* 27.8*  27.3*  MCV 75.8* 74.6* 75.1*  74.6*  PLT 114* 109* 140*  128*    Medications:  aspirin EC  81 mg Oral Daily   cefdinir  300 mg Oral Daily   cinacalcet  30 mg Oral Q breakfast   fluticasone furoate-vilanterol  1 puff Inhalation Daily   heparin  5,000 Units Subcutaneous Q8H   insulin aspart  0-5 Units Subcutaneous QHS   insulin aspart  0-9 Units Subcutaneous TID WC   insulin glargine-yfgn  8 Units Subcutaneous Daily   mycophenolate  360 mg Oral BID   pantoprazole  40 mg Oral Daily   predniSONE  5 mg Oral  Q breakfast   rosuvastatin  20 mg Oral Daily   tacrolimus ER  9 mg Oral QAC breakfast      Gean Quint, MD [facility] 01/05/2022, 8:29 AM

## 2022-01-05 NOTE — Progress Notes (Signed)
NAME:  Rachael George, MRN:  [id number], DOB:  [birth year], LOS: 1 ADMISSION DATE:  01/03/2022  Subjective  NAEON/Overnight events: None. BM yesterday with no blood.   Patient evaluated at bedside this AM. She reports that the weakness in her legs has improved and she was able to ambulate to bedside commode with assistance yesterday and this AM. She reports minimal dysuria at the end of urination. She denies chest pain, abdominal pain, SOB, flank pain.   Objective   Blood pressure 118/66, pulse 95, temperature 99.4 F (37.4 C), temperature source Oral, resp. rate 16, height 5\' 1"  (1.549 m), weight 186 lb (84.4 kg), SpO2 99 %.     Intake/Output Summary (Last 24 hours) at 01/05/2022 1155 Last data filed at 01/05/2022 3329 Gross per 24 hour  Intake 894.12 ml  Output 275 ml  Net 619.12 ml   Filed Weights   01/03/22 1510  Weight: 186 lb (84.4 kg)    Physical Exam: General: older African American female in NAD, laying on bed.  CV: Regular rate and rhythm. Systolic murmur on RUSB.  Pulm: CTAB. Normal WOB on RA.  Abdomen: soft, non-tender, non-distended  Neuro: 5/5 strength in BLE. 5/5 strength in BUE. Alert and answering questions appropriately. Psych: pleasant affect  Labs       Latest Ref Rng & Units 01/05/2022    5:04 AM 01/04/2022    9:50 AM 01/03/2022    4:33 PM  CBC  WBC 4.0 - 10.5 K/uL 4.0 - 10.5 K/uL 7.0    7.2  7.0  7.6   Hemoglobin 12.0 - 15.0 g/dL 12.0 - 15.0 g/dL 9.0    9.1  8.9  9.4   Hematocrit 36.0 - 46.0 % 36.0 - 46.0 % 27.3    27.8  26.5  28.5   Platelets 150 - 400 K/uL 150 - 400 K/uL 128    140  109  114       Latest Ref Rng & Units 01/05/2022    5:04 AM 01/04/2022    9:50 AM 01/03/2022    4:33 PM  BMP  Glucose 70 - 99 mg/dL 183  285  342   BUN 8 - 23 mg/dL 39  49  50   Creatinine 0.44 - 1.00 mg/dL 2.33  2.56  2.73   Sodium 135 - 145 mmol/L 136  129  129   Potassium 3.5 - 5.1 mmol/L 3.7  3.3  3.7   Chloride 98 - 111 mmol/L 105  96  93    CO2 22 - 32 mmol/L 20  21  22    Calcium 8.9 - 10.3 mg/dL 9.1  9.3  9.2     Imaging: Pending renal U/S  Summary  Rachael George is a 66 y/o female with past medical history of HTN, HLD, T2DM, RA, ESRD s/p R renal transplant (07/2019), GERD, OSA (not on CPAP), asthma, gout, hx gastric ulcers and obesity that presents w/ 2 weeks of bilateral LE weakness and was admitted for further evaluation of her weakness and AKI.   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 Her symptoms started 2 weeks ago and she was prescribed 5 day course of prednisone 40mg  1 week prior to symptoms for ongoing cough after testing COVID+ 7 weeks ago. This could be consistent with time course of symptom onset after receiving steroids and the severe LE weakness.  -Patient has improved significantly since admission -Continue home prednisone 5 mg, part of transplant regimen   AKI  ESRD s/p R renal transplant Baseline creatinine near 1.2-1.5.  Creatinine elevated at 2.73 on admission and trending down to 2.33> 2.56  Nephrology rebolus IVF yesterday and started her on mIVF. BK, EBV, CMV negative 07/2021.  - nephrology following, appreciate assistance  - continuing home transplant meds (mycophenolate, tacrolimus, prednisone) - obtain tac level in case needed later on  - Avoid nephrotoxic agents (NSAIDs, contrast, baclofen, fleet enema or Mg based laxatives) -AM RFP  - f/u Renal  U/S  Microcytic anemia  Unclear cause of her microcytic anemia, could be anemia of chronic disease. Colonoscopy in 2017 with 2 sessile polyps in cecum that were removed. Last iron panel in 07/2021 with Ferritin 958, transferrin 201. Last CBC with microcytic anemia in 07/2021. Will need colonscopy outpatient.  -repeat iron studies  -f/u retic count -f/u smear   UTI Patient received 1 dose of IV Rocephin 10/14 symptoms are improving. She has penicillin allergy. Avoid Bactrim given her AKI. -continue cefdinir (10/15-10/18).  -f/u UCx   Post-COVID cough syndrome Asthma Lungs exam sounds clear. Suspect mild chronic bronchitis secondary to COVID.  Respiratory Gram stain grew polymicrobial but no consolidation on chest x-ray, likely normal colonization.   - f/u respiratory Cx - Albuterol nebulizer - Breo Ellipta inhaler - Robitussin DM PRN    T2DM A1C of 8.4.  Home medications only Trulicity. Fasting glucose this AM 194. - increase semglee 8U - SSI (sensitive) w/ HS coverage - CBG monitoring    HTN -Holding home amlodipine, HCTZ, Coreg in the setting of low normal blood pressure and AKI.   HLD - Home rosuvastatin 20 mg   GERD - Home pantoprazole 40 mg  Best practice:  Diet: Heart Healthy/carb IVF: NS @100ml /hr  VTE: Heparin Code: Full PT/OT recs: SNF TOC recs: [place] services, patient refused SNF placement   Dispo: Anticipated discharge to Home in 2 days pending improvement of weakness and AKI.   Rolanda Lundborg, MD Internal Medicine Resident PGY-1 PAGER: [phone] 01/05/2022 11:55 AM  If after hours (below), please contact on-call pager: [phone] 5PM-7AM Monday-Friday 1PM-7AM Saturday-Sunday

## 2022-01-05 NOTE — TOC Initial Note (Addendum)
Transition of Care [facility]) - Initial/Assessment Note    Patient Details  Name: Rachael George MRN: [id number] Date of Birth: [birth year]  Transition of [facility]) CM/SW Contact:    Curlene Labrum, RN Phone Number: 01/05/2022, 9:56 AM  Clinical Narrative:                 CM met with the patient at the bedside to discuss transitions of care needs.  The patient was decline for CIR and the patient states that she declines admission to SNF and wants to return home with home health services.  The patient lives at home with three grandchildren ages 65, 14 and 4 years old.  The patient's oldest grandchild attends [employer] and is able to provide assistance at the home including transportation.  DME at the home includes cane, 3:1, wheelchair and shower seat.  Transportation:  The patient uses [employer] assistance for transportation to appointments when her granddaughter is unable to assist with transportation.  I spoke with the patient and she is agreeable to home health services at the home and did not have a preference for home health providers.  I called Lillia Mountain, CM at [facility] and requested review of clinicals for home health services.  Will follow up for PT/OT needs at the home.  CM will continue to follow the patient for [facility] needs - including [place] services.  Patient declined SNF placement.  I called and spoke with Lillia Mountain, CM at [facility] and they offered home health services including PT/OT.  The patient will continue to work with PT/OT for strengthening before returning to home - later date.  The patient refused SNf placement.  Expected Discharge Plan: [place] Barriers to Discharge: Continued Medical Work up   Patient Goals and CMS Choice Patient states their goals for this hospitalization and ongoing recovery are:: Patient wants to return home. CMS Medicare.gov Compare Post Acute Care list provided to:: Patient Choice offered to / list presented to : Patient  Expected  Discharge Plan and Services Expected Discharge Plan: [facility]   Discharge Planning Services: CM Consult Post Acute Care Choice: [place] arrangements for the past 2 months: Mobile Home                                      Prior Living Arrangements/Services Living arrangements for the past 2 months: Mobile Home Lives with:: Adult Children, Minor Children (Patient lives with grandchildren ages 65,17, and 20. and 20.) Patient language and need for interpreter reviewed:: Yes Do you feel safe going back to the place where you live?: Yes      Need for Family Participation in Patient Care: Yes (Comment) Care giver support system in place?: Yes (comment) Current home services: DME (DME at home includes cane, 3:1, wheelchair, and shower seat) Criminal Activity/Legal Involvement Pertinent to Current Situation/Hospitalization: No - Comment as needed  Activities of Daily Living Home Assistive Devices/Equipment: None (Cane when needed) ADL Screening (condition at time of admission) Patient's cognitive ability adequate to safely complete daily activities?: Yes Is the patient deaf or have difficulty hearing?: No Does the patient have difficulty seeing, even when wearing glasses/contacts?: No Does the patient have difficulty concentrating, remembering, or making decisions?: No Patient able to express need for assistance with ADLs?: Yes Does the patient have difficulty dressing or bathing?: Yes Independently performs ADLs?: No Does the patient have difficulty  walking or climbing stairs?: Yes Weakness of Legs: Both Weakness of Arms/Hands: None  Permission Sought/Granted Permission sought to share information with : Case Manager Permission granted to share information with : Yes, Verbal Permission Granted     Permission granted to share info w AGENCY: Home Health agency        Emotional Assessment Appearance:: Appears stated age Attitude/Demeanor/Rapport:  Gracious Affect (typically observed): Accepting Orientation: : Oriented to Self, Oriented to Place, Oriented to  Time, Oriented to Situation Alcohol / Substance Use: Not Applicable Psych Involvement: No (comment)  Admission diagnosis:  Hyponatremia [E87.1] Weakness [R53.1] Generalized weakness [R53.1] AKI (acute kidney injury) ([place]) [N17.9] Urinary tract infection in female [N39.0] Muscle weakness of lower extremity [M62.81] Patient Active Problem List   Diagnosis Date Noted   Acute kidney injury ([place]) 01/04/2022   UTI (urinary tract infection) 01/04/2022   Muscle weakness of lower extremity 01/04/2022   Bilateral leg weakness 01/03/2022   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   Rheumatoid arthritis ([place]) 05/30/2021   Chronic kidney disease 02/07/2021   Epigastric pain 02/07/2021   H pylori ulcer 02/07/2021   H/O transfusion of packed red blood cells 02/07/2021   History of esophageal ulcer 02/07/2021   History of multiple miscarriages 02/07/2021   Kidney stones 02/07/2021   Vertigo 02/07/2021   ESRD (end stage renal disease) ([place]) 07/23/2020   Kidney transplant recipient 12/19/2019   Hypomagnesemia 08/22/2019   Immunosuppressive management encounter following kidney transplant 08/07/2019   Immunosuppression ([place]) 07/28/2019   Headache, unspecified 04/28/2019   Allergy, unspecified, initial encounter 10/24/2018   Anaphylactic shock, unspecified, initial encounter 10/24/2018   GERD (gastroesophageal reflux disease) 08/30/2018   OSA (obstructive sleep apnea) 09/07/2017   Other fatigue 07/05/2017   Encounter for removal of sutures 06/25/2017   Pain, unspecified 06/02/2017   Diarrhea 05/28/2017   Fever, unspecified 05/28/2017   Iron deficiency anemia 05/28/2017   Other specified coagulation defects ([place]) 05/28/2017   Encounter for screening for respiratory tuberculosis 05/28/2017   Pruritus, unspecified 05/28/2017   Shortness of breath 05/28/2017   Type 2 diabetes mellitus  with diabetic peripheral angiopathy without gangrene ([place]) 05/28/2017   Secondary hyperparathyroidism of renal origin ([place]) 05/28/2017   Periodic limb movements of sleep 03/17/2017   Eczema 09/17/2016   Acute blood loss anemia 03/03/2016   Hypovolemic shock ([place]) 03/03/2016   Staphylococcus aureus bacteremia 03/03/2016   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[place]) 03/02/2016   Asthma, chronic 10/15/2015   Insomnia 10/15/2015   Sleep paralysis 10/15/2015   Sleep talking 10/15/2015   Vision problems 10/15/2015   Hematoma 02/07/2015   Chronic gout 10/05/2014   Anemia in chronic kidney disease 12/27/2013   Essential hypertension, benign 12/07/2012   Poorly controlled type 2 diabetes mellitus ([place]) 12/07/2012   Low back pain 12/07/2012   Right leg numbness 12/07/2012   Asthma, mild intermittent 12/07/2012   Hyperlipemia 12/07/2012   Obesity, unspecified 12/07/2012   PCP:  Loura Pardon, MD Pharmacy:   [facility] [place], [place] [address] AT [facility] [address] [place] [address] Phone: [phone] Fax: [phone]     Social Determinants of Health (SDOH) Interventions    Readmission Risk Interventions    01/05/2022    9:53 AM  Readmission Risk Prevention Plan  Transportation Screening Complete  PCP or Specialist Appt within 5-7 Days Complete  Home Care Screening Complete  Medication Review (RN CM) Complete

## 2022-01-05 NOTE — Progress Notes (Cosign Needed Addendum)
Physical Therapy Treatment Patient Details Name: Rachael George MRN: [id number] DOB: 1955-10-14 Today's Date: 01/05/2022   History of Present Illness 66 y/o female that presents 01/03/22 with 2 weeks of bilateral LE weakness.  Suspect that her LE weakness is secondary to steroid-induced myopathy in the setting of increased steroid dose and decreased physical activity vs UTI;  PMH  HTN, HLD, T2DM, RA, ESRD s/p R renal transplant (07/2019), GERD, OSA (not on CPAP), asthma, and obesity.    PT Comments    Pt received in supine, lethargic and difficult to awaken (not to hand squeeze by PTA but when RN entered room, pt awoken to RN voice). Pt reluctant to participate in session due to fatigue/lethargy but agreeable with max encouragement. Pt limited to short household distance gait trial due to fatigue but making progress toward goals, able to perform step-ups x4 reps to simulate home entry with up to minA and needing mostly min guard for transfers.  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 Currently recommend short term low intensity post-acute rehab as pt remains below functional baseline, discussed with supervising PT Arby Barrette. If patient refuses SNF, she would benefit from max [facility] services and private caregiver assistance or increased assist from friends/community members if able. Pt continues to benefit from PT services to progress toward functional mobility goals.   Recommendations for follow up therapy are one component of a multi-disciplinary discharge planning process, led by the attending physician.  Recommendations may be updated based on patient status, additional functional criteria and insurance authorization.  Follow Up Recommendations  Skilled nursing-short term rehab (<3 hours/day) (pt likely to  refuse, in which case she would benefit from max [facility] services) Can patient physically be transported by private vehicle: Yes   Assistance Recommended at Discharge Frequent or constant Supervision/Assistance  Patient can return home with the following Assistance with cooking/housework;Assist for transportation;Help with stairs or ramp for entrance;A lot of help with walking and/or transfers;A little help with bathing/dressing/bathroom   Equipment Recommendations  Other (comment);None recommended by PT (pt has rollator, WC, cane, 3in1 per chart review)    Recommendations for Other Services       Precautions / Restrictions Precautions Precautions: Fall Restrictions Weight Bearing Restrictions: No     Mobility  Bed Mobility Overal bed mobility: Needs Assistance Bed Mobility: Rolling, Sidelying to Sit, Sit to Sidelying Rolling: Supervision Sidelying to sit: Min guard, HOB elevated     Sit to sidelying: Min guard, HOB elevated General bed mobility comments: vc for use of rail to roll; close guarding to come to sit for safety; min guard to guide BLE to sidelying/for safety    Transfers Overall transfer level: Needs assistance Equipment used: None, 1 person hand held assist Transfers: Sit to/from Stand Sit to Stand: Min assist, +2 safety/equipment, Min guard           General transfer comment: pt given minA on initial attempt but defers to proceed after raising halfway up; after seated break pt requesting to perform without therapist lift assist and performed with min guard for safety.    Ambulation/Gait Ambulation/Gait assistance: Min guard Gait Distance (Feet): 25 Feet Assistive device: None Gait Pattern/deviations: Step-to pattern, Shuffle, Decreased dorsiflexion - right, Decreased dorsiflexion - left, Decreased stride length   Gait velocity interpretation: <1.31 ft/sec, indicative of household ambulator  General Gait Details: small, low steps with limited foot  clearance and increased postural sway but no overt LOB; quick to fatigue, requesting to return to bed to sleep.   Stairs Stairs: Yes Stairs assistance: Min assist, Min guard, +2 safety/equipment Stair Management: Two rails, Step to pattern, Forwards Number of Stairs: 4 General stair comments: initially needing minA due to mild instability with stepping up/down then able to perform remaining 3 steps with min guard and cues for sequencing; single 7" step in room x4 trials and RW to simulate hand rails   Wheelchair Mobility    Modified Rankin (Stroke Patients Only)       Balance Overall balance assessment: Needs assistance Sitting-balance support: No upper extremity supported, Feet supported Sitting balance-Leahy Scale: Good Sitting balance - Comments: pt defers balance challenge activities   Standing balance support: No upper extremity supported Standing balance-Leahy Scale: Fair Standing balance comment: static standing unsupported no LOB, mild instability with dynamic standing tasks, pt deferring DGI tasks after short distance gait trial in the room                            Cognition Arousal/Alertness: Lethargic Behavior During Therapy: Flat affect Overall Cognitive Status: No family/caregiver present to determine baseline cognitive functioning                                 General Comments: Pt difficult to awaken when PTA in room initially so RN called into room to assess and pt awakens immediately to RN voice (pt asking for meds but unwilling to specify which medicine she wants). Pt c/o feeling fatigued and reluctant to get OOB but does with encouragement. Pt eager to return to supine and falling asleep at end of session. Flat affect throughout all tasks.        Exercises Other Exercises Other Exercises: encouraged to continue AROM and  especially ankle pumps for ankle strength and flexibility, pt defers to perform during session    General  Comments General comments (skin integrity, edema, etc.): Pt reports the AD she has at home is 4WW not a regular RW.      Pertinent Vitals/Pain Pain Assessment Pain Assessment: No/denies pain     PT Goals (current goals can now be found in the care plan section) Acute Rehab PT Goals Patient Stated Goal: go straight home and not to rehab PT Goal Formulation: With patient Time For Goal Achievement: 01/18/22 Progress towards PT goals: Progressing toward goals    Frequency    Min 3X/week      PT Plan Current plan remains appropriate       AM-PAC PT "6 Clicks" Mobility   Outcome Measure  Help needed turning from your back to your side while in a flat bed without using bedrails?: A Little Help needed moving from lying on your back to sitting on the side of a flat bed without using bedrails?: A Little Help needed moving to and from a bed to a chair (including a wheelchair)?: A Little Help needed standing up from a chair using your arms (e.g., wheelchair or bedside chair)?: A Little Help needed to walk in hospital room?: A Lot (mod safety cues) Help needed climbing 3-5 steps with a railing? : A Lot (mod safety cues, anticipate increased assist with only single rail) 6 Click Score: 16    End of Session Equipment Utilized During Treatment:  Gait belt Activity Tolerance: Patient limited by lethargy Patient left: in bed;with call bell/phone within reach;with bed alarm set;Other (comment) (purewick in place) Nurse Communication: Mobility status;Other (comment) (pt requesting meds but not able to specify what type she wants) PT Visit Diagnosis: Other abnormalities of gait and mobility (R26.89);Muscle weakness (generalized) (M62.81)     Time: 6606-3016 PT Time Calculation (min) (ACUTE ONLY): 12 min  Charges:  $Gait Training: 8-22 mins                     Alisah Grandberry P., PTA Acute Rehabilitation Services Secure Chat Preferred 9a-5:30pm Office: [place] 01/05/2022, 5:13 PM

## 2022-01-05 NOTE — Discharge Instructions (Addendum)
Rachael George, it has been a pleasure caring for you and I am so happy to see you are doing well!   You were hospitalized for weakness and acute kidney injury and treated for these conditions. Since then you have recovered, you are no longer requiring hospitalization.  We also continued a lot of your home medications to manage your hyperlipidemia, GERD, and asthma. In addition to medications, you were also seen by nephrology.   When you are discharged we would like you to do the following:  1. You were started on the following medications in the hospital. Please continue them using these following instructions: -take one more dose of cefdinir for your UTI   2.  Some of your medication regimen has been changed.  Please see the following instructions for the changes: -stop taking your amlodipine, HCTZ, and carvedilol   3. Continue taking the rest of the medications as you were before you came to the hospital.  4. Follow-up with your PCP soon and your outpatient nephrologist  Take care!   Internal Medicine Teaching Service

## 2022-01-05 NOTE — Inpatient Diabetes Management (Signed)
Inpatient Diabetes Program Recommendations  AACE/ADA: New Consensus Statement on Inpatient Glycemic Control (2015)  Target Ranges:  Prepandial:   less than 140 mg/dL      Peak postprandial:   less than 180 mg/dL (1-2 hours)      Critically ill patients:  140 - 180 mg/dL    Latest Reference Range & Units 01/04/22 07:30 01/04/22 11:22 01/04/22 15:48 01/04/22 20:44  Glucose-Capillary 70 - 99 mg/dL 257 (H) 242 (H) 263 (H) 218 (H)  (H): Data is abnormally high  Latest Reference Range & Units 01/05/22 07:35 01/05/22 11:44  Glucose-Capillary 70 - 99 mg/dL 194 (H)  2 units Novolog  303 (H)  7 units Novolog  8 units Semglee @1012   (H): Data is abnormally high     Home DM Meds: Trulicity 0.5 mg Qweek    Current Orders: Semglee 8 units daily    Novolog 0-9 units TID ac/hs    Prednisone 5 mg daily    MD- Please consider starting Novolog Meal Coverage:  Novolog 4 units TID with meals HOLD if pt NPO HOLD if pt eats <50% meals   --Will follow patient during hospitalization--  Wyn Quaker RN, MSN, [place] Diabetes Coordinator Inpatient Glycemic Control Team Team Pager: [phone] (8a-5p)

## 2022-01-06 ENCOUNTER — Other Ambulatory Visit (HOSPITAL_COMMUNITY): Payer: Self-pay

## 2022-01-06 LAB — CULTURE, RESPIRATORY W GRAM STAIN: Culture: NORMAL

## 2022-01-06 LAB — URINE CULTURE: Culture: >=100000 — AB

## 2022-01-06 LAB — RENAL FUNCTION PANEL
Albumin: 2 g/dL — ABNORMAL LOW (ref 3.5–5.0)
Anion gap: 10 (ref 5–15)
BUN: 32 mg/dL — ABNORMAL HIGH (ref 8–23)
CO2: 23 mmol/L (ref 22–32)
Calcium: 9.5 mg/dL (ref 8.9–10.3)
Chloride: 107 mmol/L (ref 98–111)
Creatinine, Ser: 2.17 mg/dL — ABNORMAL HIGH (ref 0.44–1.00)
GFR, Estimated: 25 mL/min — ABNORMAL LOW (ref >60)
Glucose, Bld: 173 mg/dL — ABNORMAL HIGH (ref 70–99)
Phosphorus: 2.4 mg/dL — ABNORMAL LOW (ref 2.5–4.6)
Potassium: 3.7 mmol/L (ref 3.5–5.1)
Sodium: 140 mmol/L (ref 135–145)

## 2022-01-06 LAB — CBC
HCT: 28 % — ABNORMAL LOW (ref 36.0–46.0)
Hemoglobin: 8.9 g/dL — ABNORMAL LOW (ref 12.0–15.0)
MCH: 24.1 pg — ABNORMAL LOW (ref 26.0–34.0)
MCHC: 31.8 g/dL (ref 30.0–36.0)
MCV: 75.7 fL — ABNORMAL LOW (ref 80.0–100.0)
Platelets: 150 10*3/uL (ref 150–400)
RBC: 3.7 MIL/uL — ABNORMAL LOW (ref 3.87–5.11)
RDW: 14.8 % (ref 11.5–15.5)
WBC: 7 10*3/uL (ref 4.0–10.5)
nRBC: 0 % (ref 0.0–0.2)

## 2022-01-06 LAB — GLUCOSE, CAPILLARY
Glucose-Capillary: 177 mg/dL — ABNORMAL HIGH (ref 70–99)
Glucose-Capillary: 298 mg/dL — ABNORMAL HIGH (ref 70–99)
Glucose-Capillary: 199 mg/dL — ABNORMAL HIGH (ref 70–99)

## 2022-01-06 LAB — RETICULOCYTES
Immature Retic Fract: 13.4 % (ref 2.3–15.9)
RBC.: 3.63 MIL/uL — ABNORMAL LOW (ref 3.87–5.11)
Retic Count, Absolute: 25.4 10*3/uL (ref 19.0–186.0)
Retic Ct Pct: 0.7 % (ref 0.4–3.1)

## 2022-01-06 LAB — MAGNESIUM: Magnesium: 1.2 mg/dL — ABNORMAL LOW (ref 1.7–2.4)

## 2022-01-06 LAB — IRON AND TIBC
Iron: 27 ug/dL — ABNORMAL LOW (ref 28–170)
Saturation Ratios: 21 % (ref 10.4–31.8)
TIBC: 132 ug/dL — ABNORMAL LOW (ref 250–450)
UIBC: 105 ug/dL

## 2022-01-06 LAB — MRSA NEXT GEN BY PCR, NASAL: MRSA by PCR Next Gen: NOT DETECTED

## 2022-01-06 LAB — FERRITIN: Ferritin: 2565 ng/mL — ABNORMAL HIGH (ref 11–307)

## 2022-01-06 MED ORDER — INSULIN GLARGINE-YFGN 100 UNIT/ML ~~LOC~~ SOLN
10.0000 [IU] | Freq: Every day | SUBCUTANEOUS | Status: DC
  Administered 2022-01-06: 10 [IU] via SUBCUTANEOUS
  Filled 2022-01-06: qty 0.1

## 2022-01-06 MED ORDER — MUPIROCIN 2 % EX OINT: 1.0000 | TOPICAL_OINTMENT | Freq: Two times a day (BID) | CUTANEOUS | Status: DC

## 2022-01-06 MED ORDER — CHLORHEXIDINE GLUCONATE CLOTH 2 % EX PADS: 6.0000 | MEDICATED_PAD | Freq: Every day | CUTANEOUS | Status: DC

## 2022-01-06 MED ORDER — CEFDINIR 300 MG PO CAPS
300.0000 mg | ORAL_CAPSULE | Freq: Every day | ORAL | 0 refills | Status: AC
  Filled 2022-01-06: qty 1, 1d supply, fill #0

## 2022-01-06 MED ORDER — MAGNESIUM SULFATE 4 GM/100ML IV SOLN
4.0000 g | Freq: Once | INTRAVENOUS | Status: AC
  Administered 2022-01-06: 4 g via INTRAVENOUS
  Filled 2022-01-06: qty 100

## 2022-01-06 NOTE — Progress Notes (Signed)
Occupational Therapy Treatment Patient Details Name: Rachael George MRN: [id number] DOB: 1955-08-31 Today's Date: 01/06/2022   History of present illness 66 y/o female that presents 01/03/22 with 2 weeks of bilateral LE weakness.  Suspect that her LE weakness is secondary to steroid-induced myopathy in the setting of increased steroid dose and decreased physical activity vs UTI;  PMH  HTN, HLD, T2DM, RA, ESRD s/p R renal transplant (07/2019), GERD, OSA (not on CPAP), asthma, and obesity.   OT comments  Pt was seen for OT ADL retraining session with focus on functional mobility, toilet and simulated tub transfer, pt education (tub transfers, 3:1 use, energy conservation and PRN assist as needed), grooming standing at sink and LB dressing. Discussed energy conservation techniques and taking rest breaks PRN. Pt has necessary DME at home per her report. She is overall Mod I for ADL's and functional mobility in room and bathroom at this time. She plans to d/c home later today if able and will have PRN assist from grandchildren (ages 40, 45, 17). She denies any further acute care needs at time, will sign off.   Recommendations for follow up therapy are one component of a multi-disciplinary discharge planning process, led by the attending physician.  Recommendations may be updated based on patient status, additional functional criteria and insurance authorization.    Follow Up Recommendations  No OT follow up    Assistance Recommended at Discharge Intermittent Supervision/Assistance  Patient can return home with the following  A little help with walking and/or transfers;A little help with bathing/dressing/bathroom;Assistance with cooking/housework;Help with stairs or ramp for entrance   Equipment Recommendations  None recommended by OT    Recommendations for Other Services      Precautions / Restrictions Precautions Precautions: Fall Restrictions Weight Bearing Restrictions: No        Mobility Bed Mobility Overal bed mobility:  (Pt received up in chair)    Transfers Overall transfer level: Modified independent Equipment used: None Transfers: Sit to/from Stand, Bed to chair/wheelchair/BSC Sit to Stand: Modified independent (Device/Increase time), Independent     Step pivot transfers: Modified independent (Device/Increase time), Independent     General transfer comment: Pt is overall Mod I for functional mobility and transfers ambulating into bathroom to use toilet this morning as well as during transfer back to chair after ADL's etc.     Balance Overall balance assessment: Modified Independent Sitting-balance support: No upper extremity supported, Feet supported Sitting balance-Leahy Scale: Good     Standing balance support: No upper extremity supported Standing balance-Leahy Scale: Good     ADL either performed or assessed with clinical judgement   ADL Overall ADL's : Needs assistance/impaired   Grooming: Wash/dry hands;Wash/dry face;Oral care;Standing;Modified independent   Upper Body Bathing: Modified independent;Sitting   Upper Body Dressing : Sitting;Independent   Lower Body Dressing: Modified independent;Independent;Sit to/from stand;Sitting/lateral leans Lower Body Dressing Details (indicate cue type and reason): Donning socks Independently sitting up in recliner Toilet Transfer: Modified Independent;Ambulation Toilet Transfer Details (indicate cue type and reason): No AD; pt reports that she uses rollator or 3:1 PRN at home. Discussed placement of 3:1 near her during the day when grandchildren are at school (ages 76, 10, 6). Pt verbalized understanding Toileting- Clothing Manipulation and Hygiene: Modified independent;Sit to/from stand;Sitting/lateral lean   Tub/ Shower Transfer: Tub transfer;Modified independent;Ambulation Tub/Shower Transfer Details (indicate cue type and reason): Simulated - Discussed use of tub bench PRN at home as pt  states she has. Per pt report, grandchildren can assist PRN  Functional mobility during ADLs: Modified independent (Pt did not use AD in room but uses rollator PRN at baseline) General ADL Comments: Pt was seen for OT ADL retraining session with focus on functional mobility, toilet and simulated tub transfer, pt education (tub transfers, 3:1 use, energy conservation and PRN assist as needed), grooming standing at sink and LB dressing. Discussed energy conservation techniques and taking rest breaks PRN. Pt has necessary DME at home per her report. She is overall Mod I for ADL's and functional mobility in room and bathroom at this time. She plans to d/c home later today if able and will have PRN assist from grandchildren (ages 35, 47, 74). She denies any further acute care needs at time, will sign off.   Extremity/Trunk Assessment Upper Extremity Assessment Upper Extremity Assessment: Overall WFL for tasks assessed   Lower Extremity Assessment Lower Extremity Assessment: Defer to PT evaluation     Vision Baseline Vision/History: 0 No visual deficits Patient Visual Report: No change from baseline Vision Assessment?: No apparent visual deficits          Cognition Arousal/Alertness: Awake/alert Behavior During Therapy: WFL for tasks assessed/performed Overall Cognitive Status: Within Functional Limits for tasks assessed              General Comments Pt reports that she has rollator, tub bench and 3:1 at home. She denies any further DME needs at this time    Pertinent Vitals/ Pain       Pain Assessment Pain Assessment: No/denies pain  Home Living   Lives with grandchildren ages 47, 65,18.     Prior Functioning/Environment   Mod I ADL's, uses rollator PRN, has DME.   Frequency  Min 2X/week        Progress Toward Goals  OT Goals(current goals can now be found in the care plan section)  Progress towards OT goals: Progressing toward goals  Acute Rehab OT Goals Patient Stated  Goal: Go home later today OT Goal Formulation: With patient Time For Goal Achievement: 01/18/22 Potential to Achieve Goals: Good  Plan Discharge plan needs to be updated       AM-PAC OT "6 Clicks" Daily Activity     Outcome Measure   Help from another person eating meals?: None Help from another person taking care of personal grooming?: A Little Help from another person toileting, which includes using toliet, bedpan, or urinal?: A Little Help from another person bathing (including washing, rinsing, drying)?: A Little Help from another person to put on and taking off regular upper body clothing?: A Little Help from another person to put on and taking off regular lower body clothing?: A Little 6 Click Score: 19    End of Session Equipment Utilized During Treatment:  (N/A)  OT Visit Diagnosis: Unsteadiness on feet (R26.81);Muscle weakness (generalized) (M62.81);Other abnormalities of gait and mobility (R26.89);Pain Pain - Right/Left:  (N/A) Pain - part of body:  (N/A)   Activity Tolerance Patient tolerated treatment well   Patient Left in chair;with call bell/phone within reach;with nursing/sitter in room   Nurse Communication Mobility status        Time: 7106-2694 OT Time Calculation (min): 19 min  Charges: OT General Charges $OT Visit: 1 Visit OT Treatments $Self Care/Home Management : 8-22 mins   Jaislyn Blinn Beth Dixon, OTR/L 01/06/2022, 12:20 PM

## 2022-01-06 NOTE — Progress Notes (Signed)
Mobility Specialist: Progress Note   01/06/22 1047  Mobility  Activity Ambulated with assistance in room  Level of Assistance Contact guard assist, steadying assist  Assistive Device None  Distance Ambulated (ft) 82 ft  Activity Response Tolerated well  $Mobility charge 1 Mobility   Pt received in the bed and agreeable to mobility. Ambulated in the room per pt request d/t frequent urination. No c/o throughout. Stopped x1 to use BSC and then assisted to the chair after session. Pt has call bell and phone in reach.   [facility] [place] Yelina Sarratt Mobility Specialist Secure Chat Only

## 2022-01-06 NOTE — Progress Notes (Signed)
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 Peak Cr 2.7 on 10/14 -AKI likely secondary to prerenal injury in the context of coughing/vomiting/weakness, found to have UTI here. -renal txp u/s without acute findings -Cr improved to 2.17 today. C/w supportive care for now. Will check renal txp u/s in the interim -c/w home immunosuppression, FK level to be drawn 10/16 -Avoid nephrotoxic medications including NSAIDs and iodinated intravenous contrast exposure unless the latter is absolutely indicated.  Preferred narcotic agents for pain control are hydromorphone, fentanyl, and methadone. Morphine should not be used. Avoid Baclofen and avoid oral sodium phosphate and magnesium citrate based laxatives / bowel preps. Continue strict Input and Output monitoring. 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 Per primary service -has chronic hypomag, recommend checking Mg levels  UTI -s/p rocephin, receiving cefdinir  Immunosuppression -Immunosuppression Management: High Risk Medical Decision Making For Drug Therapy Requiring Intensive Monitoring For Toxicity I/S levels: FK level pending 10/17 Will continue intensive monitoring for drug levels and toxicity via regularly scheduled laboratory assays given the narrow therapeutic index of the immunosuppressive drug therapy listed above -home maintenance: envarsus 9mg  daily, myfortic 360mg  BID, prednisone 5mg  daily -FK goal level 7-8  Anemia of CKD -with downtrending hgb, would recommend at least checking FOBT -hgb stable today  Uncontrolled DM2 -per primary service  Gean Quint, MD [facility]  Subjective:   No acute events. Patient reports that her weakness is improving. Able to walk more. No other complaints    Objective:   BP (!) 142/82 (BP Location: Left Leg)   Pulse 95   Temp 99.3 F (37.4 C) (Oral)   Resp 16   Ht 5\' 1"  (1.549 m)   Wt 84.4 kg   SpO2 99%   BMI 35.14 kg/m   Intake/Output Summary (Last 24 hours) at 01/06/2022 5638 Last data filed at 01/06/2022 0736 Gross per 24 hour  Intake 476 ml  Output 1275 ml  Net -799 ml   Weight change:   Physical Exam: Gen: nad CVS: s1s2, rrr Resp: cta b/l Abd: soft, nt/nd Ext: no sig edema Neuro: awake, alert  Imaging: US Renal Transplant w/Doppler  Result Date: 01/05/2022 CLINICAL DATA:  Acute kidney injury.  History of renal transplant. EXAM: ULTRASOUND OF RENAL TRANSPLANT WITH RENAL DOPPLER ULTRASOUND TECHNIQUE: Ultrasound examination of the renal transplant was performed with gray-scale, color and duplex doppler evaluation. COMPARISON:  None Available. FINDINGS: Transplant kidney location: RLQ Transplant Kidney: Renal measurements: 13.6 x 6.1 x 5.5 cm = volume: 258mL. Normal in size and parenchymal echogenicity. No evidence of mass or hydronephrosis. No peri-transplant fluid collection seen. Color flow in the main renal artery:  Yes Color flow in the main renal vein:  Yes Duplex Doppler Evaluation: Main Renal Artery Velocity: 60.9 cm/sec Main Renal Artery Resistive Index: 0.64 Venous waveform in main renal vein:  present/absent Intrarenal resistive index in upper pole:  0.63 (normal 0.6-0.8; equivocal 0.8-0.9; abnormal >= 0.9) Intrarenal resistive index in lower pole: 0.52 (normal 0.6-0.8; equivocal 0.8-0.9; abnormal >= 0.9) Bladder: Normal for degree of bladder distention. Other findings: Atrophic echogenic native kidneys bilaterally, poorly visualized. No hydronephrosis in the native kidneys. Simple 2.0 cm interpolar native right renal cyst, for which no follow-up is recommended. IMPRESSION: 1. Normal grayscale ultrasound appearance of the transplant kidney in the  right lower quadrant. No hydronephrosis. No peritransplant fluid collection.  2. Patent transplant renal vasculature with normal resistive indices. 3. Atrophic echogenic native kidneys bilaterally, poorly visualized. No hydronephrosis in the native kidneys. 4. Simple 2.0 cm native right renal cyst, for which no follow-up imaging is recommended. Electronically Signed   By: Ilona Sorrel M.D.   On: 01/05/2022 12:45    Labs: BMET Recent Labs  Lab 01/03/22 1633 01/04/22 0950 01/05/22 0504 01/06/22 0410  NA 129* 129* 136 140  K 3.7 3.3* 3.7 3.7  CL 93* 96* 105 107  CO2 22 21* 20* 23  GLUCOSE 342* 285* 183* 173*  BUN 50* 49* 39* 32*  CREATININE 2.73* 2.56* 2.33* 2.17*  CALCIUM 9.2 9.3 9.1 9.5  PHOS  --   --   --  2.4*   CBC Recent Labs  Lab 01/03/22 1633 01/04/22 0950 01/05/22 0504 01/06/22 0410  WBC 7.6 7.0 7.2  7.0 7.0  NEUTROABS  --   --  6.0  --   HGB 9.4* 8.9* 9.1*  9.0* 8.9*  HCT 28.5* 26.5* 27.8*  27.3* 28.0*  MCV 75.8* 74.6* 75.1*  74.6* 75.7*  PLT 114* 109* 140*  128* 150    Medications:     aspirin EC  81 mg Oral Daily   cefdinir  300 mg Oral Daily   Chlorhexidine Gluconate Cloth  6 each Topical Q0600   cinacalcet  30 mg Oral Q breakfast   fluticasone furoate-vilanterol  1 puff Inhalation Daily   heparin  5,000 Units Subcutaneous Q8H   insulin aspart  0-5 Units Subcutaneous QHS   insulin aspart  0-9 Units Subcutaneous TID WC   insulin glargine-yfgn  10 Units Subcutaneous Daily   mupirocin ointment  1 Application Nasal BID   mycophenolate  360 mg Oral BID   pantoprazole  40 mg Oral Daily   predniSONE  5 mg Oral Q breakfast   rosuvastatin  20 mg Oral Daily   tacrolimus ER  9 mg Oral QAC breakfast      Gean Quint, MD [facility] 01/06/2022, 8:28 AM

## 2022-01-06 NOTE — Discharge Summary (Signed)
Name: Rachael George MRN: [id number] DOB: [birth year] 66 y.o. PCP: Rachael Pardon, MD  Date of Admission: 01/03/2022  1:54 PM Date of Discharge: 01/06/22 Attending Physician: Dr. Dareen George  Discharge Diagnosis: Principal Problem:   Bilateral leg weakness Active Problems:   Essential hypertension, benign   Poorly controlled type 2 diabetes mellitus (HCC)   Asthma, mild intermittent   Obesity, unspecified   Post-COVID chronic cough   Kidney transplant recipient   ESRD (end stage renal disease) (HCC)   Asthma, chronic   Rheumatoid arthritis ([place])   Acute kidney injury ([place])   UTI (urinary tract infection)   Muscle weakness of lower extremity    Discharge Medications: Allergies as of 01/06/2022       Reactions   Candesartan Anaphylaxis, Other (See Comments)   Swelling of the mouth and tongue.   Candesartan Cilexetil Anaphylaxis   Lisinopril Anaphylaxis, Other (See Comments)   Swelling of the mouth and tongue.   Nsaids Anaphylaxis, Other (See Comments)   Swelling of the mouth and tongue.   Metoprolol Tartrate Rash   Penicillins Rash, Other (See Comments)   Has patient had a PCN reaction causing immediate rash, facial/tongue/throat swelling, SOB or lightheadedness with hypotension: No Has patient had a PCN reaction causing severe rash involving mucus membranes or skin necrosis: No Has patient had a PCN reaction that required hospitalization No Has patient had a PCN reaction occurring within the last 10 years: No If all of the above answers are "NO", then may proceed with Cephalosporin use.        Medication List     STOP taking these medications    amLODipine 5 MG tablet Commonly known as: NORVASC   carvedilol 12.5 MG tablet Commonly known as: COREG   hydrochlorothiazide 12.5 MG tablet Commonly known as: HYDRODIURIL       TAKE these medications    Accu-Chek Guide test strip Generic drug: glucose blood USE TO TEST BLOOD SUGAR LEVELS DAILY    Accu-Chek Guide w/Device Kit 1 each by Does not apply route daily. Use as instructed to check blood sugar once daily. E11.22, N18.6, Z99.2   Accu-Chek Softclix Lancets lancets USE AS DIRECTED TO TEST DAILY   acetaminophen 650 MG CR tablet Commonly known as: TYLENOL Take 1,300 mg by mouth 2 (two) times daily as needed for pain.   albuterol (2.5 MG/3ML) 0.083% nebulizer solution Commonly known as: PROVENTIL Take 3 mLs (2.5 mg total) by nebulization every 6 (six) hours as needed for wheezing or shortness of breath. ICD 10:J45.20 What changed:  when to take this additional instructions   albuterol 108 (90 Base) MCG/ACT inhaler Commonly known as: VENTOLIN HFA INHALE 2 PUFFS BY MOUTH EVERY 6 HOURS AS NEEDED FOR WHEEZE OR SHORTNESS OF BREATH What changed:  how much to take how to take this when to take this reasons to take this additional instructions   aspirin EC 81 MG tablet Take 81 mg by mouth daily.   budesonide-formoterol 160-4.5 MCG/ACT inhaler Commonly known as: Symbicort Inhale 2 puffs into the lungs 2 (two) times daily for 1 day.   cefdinir 300 MG capsule Commonly known as: OMNICEF Take 1 capsule (300 mg total) by mouth daily for 1 dose. Start taking on: January 07, 2022   Cholecalciferol 25 MCG (1000 UT) capsule Take 3,000 Units by mouth in the morning and at bedtime.   cinacalcet 30 MG tablet Commonly known as: SENSIPAR Take 30 mg by mouth daily.   fluticasone 50 MCG/ACT nasal spray Commonly  known as: FLONASE SPRAY 2 SPRAYS INTO EACH NOSTRIL EVERY DAY What changed: See the new instructions.   ipratropium-albuterol 0.5-2.5 (3) MG/3ML Soln Commonly known as: DUONEB Take 3 mLs by nebulization as needed (wheezing/shortness of breath).   magnesium oxide 400 MG tablet Commonly known as: MAG-OX Take 400 mg by mouth daily.   multivitamin Tabs tablet Take 1 tablet by mouth daily with lunch.   mycophenolate 180 MG EC tablet Commonly known as: MYFORTIC Take  360 mg by mouth 2 (two) times daily.   pantoprazole 40 MG tablet Commonly known as: PROTONIX TAKE 1 TABLET(40 MG) BY MOUTH TWICE DAILY What changed: See the new instructions.   predniSONE 5 MG tablet Commonly known as: DELTASONE Take 5 mg by mouth daily with breakfast.   Restasis 0.05 % ophthalmic emulsion Generic drug: cycloSPORINE Place 1 drop into both eyes daily as needed (redness/floaters).   rosuvastatin 20 MG tablet Commonly known as: CRESTOR TAKE 1 TABLET(20 MG) BY MOUTH DAILY What changed: See the new instructions.   tacrolimus ER 1 MG Tb24 Commonly known as: ENVARSUS XR Take 9 mg by mouth daily before breakfast.   Trulicity 2.77 AJ/2.8NO Sopn Generic drug: Dulaglutide ADMINISTER 0.75 MG UNDER THE SKIN 1 TIME A WEEK What changed: See the new instructions.        Disposition and follow-up:   Ms.Rachael George was discharged from [facility] in Stable condition.  At the hospital follow up visit please address:  1.  Follow-up: a. Blood pressure regimen. Home BP meds held at time of discharge due to low-normal blood pressures during admission.  b. Diabetes medications. Restarted on home trulicity but Hgb M7E 8.4 at time of admission and elevated glucose throughout.   c. Kidney function  d. Microcytic anemia, needs colonoscopy   2.  Labs / imaging needed at time of follow-up: Repeat BMP to monitor Cr  3.  Pending labs/ test needing follow-up: Tacrolimus level  Follow-up Appointments:  Follow-up Information     George, Himanshu, MD. Schedule an appointment as soon as possible for a visit.   Specialty: Family Medicine Contact information: [address] [address] [place], [place] Follow up.   Why: [facility] will be providing PT/OT at the home.  They will call you in the next 24-48 hours after discharge to home to provide home health services. Contact  information: [place] [address] [phone]                 Hospital Course by problem list: Rachael George is a 66 y/o female with past medical history of HTN, HLD, T2DM, RA, ESRD s/p R renal transplant (07/2019), GERD, OSA (not on CPAP), asthma, and obesity that presents w/ 2 weeks of bilateral LE weakness and was admitted for further evaluation of her weakness.    #Weakness, bilateral legs Patient presents with 2 weeks of bilateral lower extremity weakness. Patient is on chronic prednisone 5 mg following her renal transplant but notes that her symptoms began after finishing a 5-day course of prednisone 40 mg for a recent covid infection.  CK is low which is less concerning for statin-induced myopathy vs muscle injury. Validity of CK is questionable given that chronic steroid use can decrease CK. Suspect that her LE weakness is secondary to steroid-induced myopathy in the setting of increased steroid dose and decreased physical activity. Differential diagnoses for her weakness also  includes physical deconditioning in the setting of recent covid infection vs UTI. Prealbumin is low which would support that malnutrition may also be playing a role in her weakness. Her weakness improved and she was able to ambulate at time of discharge. PT/OT recommended SNF but she opted for home health at time of discharge.   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  Creatinine elevated at 2.73 on admission. Likely prerenal in the setting of recent viral illness. Patient given 1L IVF bolus in the ED. Restarted on home immunosuppressive regimen. Nephrology consulted. Recommended continuing home immunosuppression meds (envarsus, myfortic, prednisone) and obtaining tacrolimus level (pending at time of discharge). She was given additional bolus and maintenance IVF. Her Cr continued to downtrend and was 2.17 at time of discharge. Nephrotoxic  medications were avoided. Renal U/S was obtained which showed no evidence of hydronephrosis.    #UTI Patient endorses 1 week of chills and dysuria. Patient also notes that her urine is dark and sweet smelling. Urinalysis demonstrates a large amount of leukocytes and many bacteria concerning for UTI. Ucx showed >100k E.coli.  Patient received 1g Ceftriaxone in the ED. Patient has penicillin allergy and ESRD s/p renal transplant. She was transitioned to cefdinir (10/15-10/18). She reported improving dysuria at time of discharge.    #Post-covid mild chronic bronchitis  #Asthma Patient endorses several weeks of cough productive of yellow sputum. Home medications include Albuterol and Symbicort. Chest x-ray is negative. COVID and flu swab negative. Respiratory gram stain demonstrates gram negative rods, gram positive rods, and gram positive cocci in pairs. Lungs sound good on exam. Low suspicion for pneumonia or asthma exacerbation at this time. Suspect that her cough is due to recent COVID infections in the setting of her chronic asthma. She was continued on albuterol nebulizer, Breo Ellipta inhaler, and Robitussin.  # T2DM, uncontrolled Patient uses Trulicity 9.56 mg weekly and states that she is compliant with this medication. Glucose 342 on admission. A1c 8.4. She was started on insulin and SSI during admission. She was discharged on Trulicity with recommendation to follow-up with PCP for additional diabetes management.   #Microcytic anemia  Suspect anemia of chronic disease. Colonoscopy in 2017 with 2 sessile polyps in cecum that were removed. Last CBC with microcytic anemia in 07/2021. Repeat iron studies consistent with anemia of chronic disease. Smear and retic count unremarkable. Will need colonscopy outpatient.   # HTN Chronic condition. Takes amlodipine 5 mg, HCTZ 12.5 mg (MWF), and carvedilol 12.5 mg BID at home. BP WNL during admission. Home anti-HTN meds held in the setting of low-normal BP  and AKI. She was advised to follow-up with her PCP re additional BP management.   # HLD Chronic condition. She was continued on her home rosuvastatin 20 mg   # GERD Chronic condition. She was continued on her home pantoprazole 40 mg.   Subjective: Did some laps around the hospital room today. Continues to report productive cough. Discussed following up with nephrologists and PCP after discharge. Discussed following up with PCP for CGM. Discussed outpatient diabetes control.  Discharge Vitals:   BP (!) 142/82 (BP Location: Left Leg)   Pulse 95   Temp 99.3 F (37.4 C) (Oral)   Resp 17   Ht 5' 1"  (1.549 m)   Wt 84.4 kg   SpO2 99%   BMI 35.14 kg/m   Discharge exam: General: Pleasant, chronically ill-appearing sitting in chair. No acute distress. CV: RRR. No murmurs, rubs, or  gallops. No LE edema Pulmonary: Lungs CTAB. Normal effort. No wheezing or rales. Abdominal: Soft, nontender, nondistended. Normal bowel sounds. Skin: Warm and dry.  Neuro: Alert and answering questions appropriately. Moves all extremities. Normal sensation. No focal deficit. 5/5 strength in bilateral lower extremities.  Psych: Normal mood and affect  Pertinent Labs, Studies, and Procedures:     Latest Ref Rng & Units 01/06/2022    4:10 AM 01/05/2022    5:04 AM 01/04/2022    9:50 AM  CBC  WBC 4.0 - 10.5 K/uL 7.0  7.0    7.2  7.0   Hemoglobin 12.0 - 15.0 g/dL 8.9  9.0    9.1  8.9   Hematocrit 36.0 - 46.0 % 28.0  27.3    27.8  26.5   Platelets 150 - 400 K/uL 150  128    140  109        Latest Ref Rng & Units 01/06/2022    4:10 AM 01/05/2022    5:04 AM 01/04/2022    9:50 AM  CMP  Glucose 70 - 99 mg/dL 173  183  285   BUN 8 - 23 mg/dL 32  39  49   Creatinine 0.44 - 1.00 mg/dL 2.17  2.33  2.56   Sodium 135 - 145 mmol/L 140  136  129   Potassium 3.5 - 5.1 mmol/L 3.7  3.7  3.3   Chloride 98 - 111 mmol/L 107  105  96   CO2 22 - 32 mmol/L 23  20  21    Calcium 8.9 - 10.3 mg/dL 9.5  9.1  9.3      DG Chest 2 View Result Date: 01/03/2022 IMPRESSION: No active cardiopulmonary disease. Electronically Signed   By: Lajean Manes M.D.   On: 01/03/2022 14:56   EXAM: 01/05/2022 12:45 ULTRASOUND OF RENAL TRANSPLANT WITH RENAL DOPPLER ULTRASOUND IMPRESSION: 1. Normal grayscale ultrasound appearance of the transplant kidney in the right lower quadrant. No hydronephrosis. No peritransplant fluid collection. 2. Patent transplant renal vasculature with normal resistive indices. 3. Atrophic echogenic native kidneys bilaterally, poorly visualized. No hydronephrosis in the native kidneys. 4. Simple 2.0 cm native right renal cyst, for which no follow-up imaging is recommended.    Discharge Instructions      Ms. Corralejo, it has been a pleasure caring for you and I am so happy to see you are doing well!   You were hospitalized for weakness and acute kidney injury and treated for these conditions. Since then you have recovered, you are no longer requiring hospitalization.  We also continued a lot of your home medications to manage your hyperlipidemia, GERD, and asthma. In addition to medications, you were also seen by nephrology.   When you are discharged we would like you to do the following:  1. You were started on the following medications in the hospital. Please continue them using these following instructions: -take one more dose of cefdinir for your UTI   2.  Some of your medication regimen has been changed.  Please see the following instructions for the changes: -stop taking your amlodipine, HCTZ, and carvedilol   3. Continue taking the rest of the medications as you were before you came to the hospital.  4. Follow-up with your PCP soon and your outpatient nephrologist  Take care!   Internal Medicine Teaching Service      Signed: Rolanda Lundborg, MD PGY-1 01/06/2022, 1:02 PM   Pager: [phone]

## 2022-01-06 NOTE — Inpatient Diabetes Management (Signed)
Inpatient Diabetes Program Recommendations  AACE/ADA: New Consensus Statement on Inpatient Glycemic Control (2015)  Target Ranges:  Prepandial:   less than 140 mg/dL      Peak postprandial:   less than 180 mg/dL (1-2 hours)      Critically ill patients:  140 - 180 mg/dL    Latest Reference Range & Units 01/05/22 07:35 01/05/22 11:44 01/05/22 16:56 01/05/22 19:57  Glucose-Capillary 70 - 99 mg/dL 194 (H)  2 units Novolog @0911  303 (H)  7 units Novolog   8 units Semglee @1012   263 (H)  5 units Novolog  207 (H)  2 units Novolog @2216   (H): Data is abnormally high  Latest Reference Range & Units 01/06/22 07:34  Glucose-Capillary 70 - 99 mg/dL 177 (H)  (H): Data is abnormally high   Home DM Meds: Trulicity 0.5 mg Qweek    Current Orders: Semglee 8 units daily               Novolog 0-9 units TID ac/hs              Prednisone 5 mg daily      MD- Please consider starting Novolog Meal Coverage:   Novolog 4 units TID with meals HOLD if pt NPO HOLD if pt eats <50% meals     --Will follow patient during hospitalization--   Wyn Quaker RN, MSN, [facility] Diabetes Coordinator Inpatient Glycemic Control Team

## 2022-01-06 NOTE — Progress Notes (Deleted)
OT Cancellation Note  Patient Details Name: MAYO OWCZARZAK MRN: [id number] DOB: 10-14-1955   Cancelled Treatment:    Reason Eval/Treat Not Completed: Patient declined, no reason specified;Other (comment) (Upon OT arrival, pt is sitting at EOB dressed and agitated stating that she is waiting to be d/c'd. Spoke with pt asking if she would have necessary PRN assist at d/c and she stated "I don't know where I'm going, I don't have any where to go". Per chart review, it appears that pt was living with adult grandchildren in mobile home. Spoke with RN whom stated that she is aware of pt situation and is addressing.)  Almyra Deforest, OTR/L 01/06/2022, 11:37 AM

## 2022-01-08 LAB — TACROLIMUS LEVEL: Tacrolimus (FK506) - LabCorp: 14.8 ng/mL (ref 2.0–20.0)

## 2022-02-10 ENCOUNTER — Other Ambulatory Visit: Payer: Self-pay | Admitting: Pulmonary Disease

## 2022-02-10 ENCOUNTER — Ambulatory Visit
Admission: RE | Admit: 2022-02-10 | Discharge: 2022-02-10 | Disposition: A | Discharge status: Home / Self Care | Payer: Medicare Other | Source: Ambulatory Visit | Attending: Pulmonary Disease | Admitting: Pulmonary Disease

## 2022-02-10 DIAGNOSIS — M25562 Pain in left knee: Secondary | ICD-10-CM

## 2022-02-23 DIAGNOSIS — M25562 Pain in left knee: Secondary | ICD-10-CM | POA: Insufficient documentation

## 2022-03-18 ENCOUNTER — Other Ambulatory Visit (HOSPITAL_COMMUNITY): Payer: Self-pay | Admitting: Family Medicine

## 2022-03-18 DIAGNOSIS — I509 Heart failure, unspecified: Secondary | ICD-10-CM

## 2022-03-25 ENCOUNTER — Ambulatory Visit (HOSPITAL_COMMUNITY)
Admission: RE | Admit: 2022-03-25 | Discharge: 2022-03-25 | Disposition: A | Discharge status: Home / Self Care | Payer: Medicare Other | Source: Ambulatory Visit | Attending: Family Medicine | Admitting: Family Medicine

## 2022-03-25 DIAGNOSIS — I3139 Other pericardial effusion (noninflammatory): Secondary | ICD-10-CM | POA: Insufficient documentation

## 2022-03-25 DIAGNOSIS — N186 End stage renal disease: Secondary | ICD-10-CM | POA: Diagnosis not present

## 2022-03-25 DIAGNOSIS — I080 Rheumatic disorders of both mitral and aortic valves: Secondary | ICD-10-CM | POA: Insufficient documentation

## 2022-03-25 DIAGNOSIS — I509 Heart failure, unspecified: Secondary | ICD-10-CM | POA: Insufficient documentation

## 2022-03-25 DIAGNOSIS — E785 Hyperlipidemia, unspecified: Secondary | ICD-10-CM | POA: Diagnosis not present

## 2022-03-25 DIAGNOSIS — E1122 Type 2 diabetes mellitus with diabetic chronic kidney disease: Secondary | ICD-10-CM | POA: Diagnosis not present

## 2022-03-25 DIAGNOSIS — I132 Hypertensive heart and chronic kidney disease with heart failure and with stage 5 chronic kidney disease, or end stage renal disease: Secondary | ICD-10-CM | POA: Diagnosis not present

## 2022-03-25 DIAGNOSIS — G4730 Sleep apnea, unspecified: Secondary | ICD-10-CM | POA: Diagnosis not present

## 2022-03-25 LAB — ECHOCARDIOGRAM COMPLETE
AR max vel: 1.5 cm2
AV Area VTI: 1.33 cm2
AV Area mean vel: 1.4 cm2
AV Mean grad: 9 mmHg
AV Peak grad: 16.3 mmHg
Ao pk vel: 2.02 m/s
Area-P 1/2: 4.49 cm2
P 1/2 time: 371 msec
S' Lateral: 2.6 cm

## 2022-03-25 NOTE — Progress Notes (Signed)
  Echocardiogram 2D Echocardiogram has been performed.  Rachael George 03/25/2022, 9:57 AM

## 2022-03-25 NOTE — Progress Notes (Signed)
I was alerted to patient's blood pressure reading of 207/94 by sonographer Johny Chess during outpatient echo exam. Given elevated reading, I placed call to patient's primary care provider at [facility]. I spoke directly with Dr. Loura Pardon and relayed blood pressure reading. He stated for patient to take her medication as prescribed, record her blood pressure for the next 7 days, and that she could leave after her echocardiogram. I relayed these instructions to the patient and gave her the 24/7 number for [facility]. [phone].

## 2022-03-31 ENCOUNTER — Encounter: Payer: Self-pay | Admitting: Podiatry

## 2022-03-31 ENCOUNTER — Ambulatory Visit (INDEPENDENT_AMBULATORY_CARE_PROVIDER_SITE_OTHER): Payer: 59 | Admitting: Podiatry

## 2022-03-31 VITALS — BP 205/118

## 2022-03-31 DIAGNOSIS — M2012 Hallux valgus (acquired), left foot: Secondary | ICD-10-CM

## 2022-03-31 DIAGNOSIS — B351 Tinea unguium: Secondary | ICD-10-CM | POA: Diagnosis not present

## 2022-03-31 DIAGNOSIS — M2011 Hallux valgus (acquired), right foot: Secondary | ICD-10-CM | POA: Diagnosis not present

## 2022-03-31 DIAGNOSIS — E1129 Type 2 diabetes mellitus with other diabetic kidney complication: Secondary | ICD-10-CM

## 2022-03-31 DIAGNOSIS — M79674 Pain in right toe(s): Secondary | ICD-10-CM | POA: Diagnosis not present

## 2022-03-31 DIAGNOSIS — M79675 Pain in left toe(s): Secondary | ICD-10-CM

## 2022-03-31 DIAGNOSIS — E119 Type 2 diabetes mellitus without complications: Secondary | ICD-10-CM

## 2022-03-31 DIAGNOSIS — Z940 Kidney transplant status: Secondary | ICD-10-CM

## 2022-03-31 HISTORY — PX: TRANSTHORACIC ECHOCARDIOGRAM: SHX275

## 2022-03-31 NOTE — Progress Notes (Signed)
ANNUAL DIABETIC FOOT EXAM  Subjective: Rachael George presents today for annual diabetic foot examination.  Chief Complaint  Patient presents with   Nail Problem    DFC BS-did not check today A1C-8.0 PCP-[place] PCP VST-02/09/2022   Patient confirms h/o diabetes.  Patient relates 9 year h/o diabetes.  Patient denies any h/o foot wounds.  Patient denies any numbness, tingling, burning, or pins/needle sensation in feet.  Risk factors: diabetes, HTN, hyperlipidemia, ESRD on hemodialysis, RA.  Loura Pardon, MD is patient's PCP.   Past Medical History:  Diagnosis Date   Anemia    Asthma    ESRD (end stage renal disease) ([place])    MWF   GERD (gastroesophageal reflux disease)    Gout    Heart murmur    born with it- nothing to worry about   Heavy menstrual bleeding    High cholesterol    History of blood transfusion 02/2016- last one   1990's- several ones    History of hiatal hernia    History of kidney stones 1980's   Hyperlipemia 12/07/2012   Hypertension    Hypothyroidism    Multiple gastric ulcers    Pneumonia 2012   Pre-diabetes    Prediabetes    RA (rheumatoid arthritis) ([place])    Sleep apnea    [place]- have CPAP, not able to use every night    Patient Active Problem List   Diagnosis Date Noted   Acute kidney injury ([place]) 01/04/2022   UTI (urinary tract infection) 01/04/2022   Muscle weakness of lower extremity 01/04/2022   Bilateral leg weakness 01/03/2022   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   Rheumatoid arthritis ([place]) 05/30/2021   Chronic kidney disease 02/07/2021   Epigastric pain 02/07/2021   H pylori ulcer 02/07/2021   H/O transfusion of packed red blood cells 02/07/2021   History of esophageal ulcer 02/07/2021   History of multiple miscarriages 02/07/2021   Kidney stones 02/07/2021   Vertigo 02/07/2021   ESRD (end stage renal disease) ([place]) 07/23/2020   Kidney transplant recipient 12/19/2019   Hypomagnesemia 08/22/2019    Immunosuppressive management encounter following kidney transplant 08/07/2019   Immunosuppression ([facility]) 07/28/2019   Headache, unspecified 04/28/2019   Allergy, unspecified, initial encounter 10/24/2018   Anaphylactic shock, unspecified, initial encounter 10/24/2018   GERD (gastroesophageal reflux disease) 08/30/2018   OSA (obstructive sleep apnea) 09/07/2017   Other fatigue 07/05/2017   Encounter for removal of sutures 06/25/2017   Pain, unspecified 06/02/2017   Diarrhea 05/28/2017   Fever, unspecified 05/28/2017   Iron deficiency anemia 05/28/2017   Other specified coagulation defects ([place]) 05/28/2017   Encounter for screening for respiratory tuberculosis 05/28/2017   Pruritus, unspecified 05/28/2017   Shortness of breath 05/28/2017   Type 2 diabetes mellitus with diabetic peripheral angiopathy without gangrene ([place]) 05/28/2017   Secondary hyperparathyroidism of renal origin ([place]) 05/28/2017   Periodic limb movements of sleep 03/17/2017   Eczema 09/17/2016   Acute blood loss anemia 03/03/2016   Hypovolemic shock ([place]) 03/03/2016   Staphylococcus aureus bacteremia 03/03/2016   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[place]) 03/02/2016   Asthma, chronic 10/15/2015   Insomnia 10/15/2015   Sleep paralysis 10/15/2015   Sleep talking 10/15/2015   Vision problems 10/15/2015   Hematoma 02/07/2015   Chronic gout 10/05/2014   Anemia in chronic kidney disease 12/27/2013   Essential hypertension, benign 12/07/2012   Poorly controlled type 2 diabetes mellitus ([place]) 12/07/2012   Low back pain 12/07/2012   Right leg numbness 12/07/2012   Asthma,  mild intermittent 12/07/2012   Hyperlipemia 12/07/2012   Obesity, unspecified 12/07/2012   Past Surgical History:  Procedure Laterality Date   A/V SHUNTOGRAM N/A 09/08/2016   Procedure: A/V Shuntogram - Left Arm AV Graft;  Surgeon: Serafina Mitchell, MD;  Location: [facility];  Service: Cardiovascular;  Laterality: N/A;   A/V SHUNTOGRAM N/A  09/02/2017   Procedure: A/V SHUNTOGRAM - Left Arm AVG;  Surgeon: Conrad [place]lawn, MD;  Location: [facility];  Service: Cardiovascular;  Laterality: N/A;   A/V SHUNTOGRAM N/A 12/21/2017   Procedure: A/V SHUNTOGRAM - left arm;  Surgeon: Serafina Mitchell, MD;  Location: [facility];  Service: Cardiovascular;  Laterality: N/A;   A/V SHUNTOGRAM Left 10/05/2018   Procedure: A/V SHUNTOGRAM;  Surgeon: Marty Heck, MD;  Location: [facility];  Service: Cardiovascular;  Laterality: Left;   ABDOMINAL HYSTERECTOMY  2000   AV FISTULA PLACEMENT Left 05/25/2016   Procedure: INSERTION OF LEFT UPPER ARM ARTERIOVENOUS (AV) LOOP GORE-TEX GRAFT ARM;  Surgeon: Elam Dutch, MD;  Location: [facility];  Service: Vascular;  Laterality: Left;   [place] Right 12/06/2014   Procedure: FIRST STAGE BASILIC VEIN TRANSPOSITION - RIGHT;  Surgeon: Serafina Mitchell, MD;  Location: [facility];  Service: Vascular;  Laterality: Right;   [place] Right 02/07/2015   Procedure: RIGHT ARM SECOND STAGE BASILIC VEIN TRANSPOSITION;  Surgeon: Serafina Mitchell, MD;  Location: [facility];  Service: Vascular;  Laterality: Right;   [place] Left 04/22/2016   Procedure: FIRST STAGE BASILIC VEIN TRANSPOSITION LEFT ARM;  Surgeon: Conrad [place], MD;  Location: [facility];  Service: Vascular;  Laterality: Left;   COLONOSCOPY W/ POLYPECTOMY     ECTOPIC PREGNANCY SURGERY  02/1982   EXCHANGE OF A DIALYSIS CATHETER Right 04/22/2016   Procedure: EXCHANGE OF A DIALYSIS CATHETER - INSERTION RIGHT INTERNAL JUGULAR & REMOVAL FROM LEFT INTERNAL JUGULAR;  Surgeon: Conrad [place], MD;  Location: [facility];  Service: Vascular;  Laterality: Right;   I & D EXTREMITY Right 02/07/2015   Procedure: IRRIGATION AND DEBRIDEMENT RIGHT ARM HEMATOMA;  Surgeon: Serafina Mitchell, MD;  Location: [facility];  Service: Vascular;  Laterality: Right;   INSERTION OF DIALYSIS CATHETER  03/03/2016   Procedure: INSERTION OF DIALYSIS  CATHETER;  Surgeon: Elam Dutch, MD;  Location: [facility];  Service: Vascular;;   IR AV DIALY SHUNT INTRO NEEDLE/INTRACATH INITIAL W/PTA/IMG LEFT  04/20/2017   IR AV DIALY SHUNT INTRO NEEDLE/INTRACATH INITIAL W/PTA/IMG LEFT  06/24/2017   IR THROMBECTOMY AV FISTULA W/THROMBOLYSIS/PTA INC/SHUNT/IMG LEFT Left 01/04/2017   IR US GUIDE VASC ACCESS LEFT  01/04/2017   left shoulder surgery  08/2005   LIGATION OF ARTERIOVENOUS  FISTULA  03/03/2016   Procedure: LIGATION OF ARTERIOVENOUS  FISTULA;  Surgeon: Elam Dutch, MD;  [facility];  Service: Vascular;;   LIGATION OF ARTERIOVENOUS  FISTULA Right 09/06/2018   Procedure: Resection of pseudoaneursym and end to end repair of right brachial artery;  Surgeon: Elam Dutch, MD;  Location: [facility] OR;  Service: Vascular;  Laterality: Right;   PERIPHERAL VASCULAR BALLOON ANGIOPLASTY  09/08/2016   Procedure: Peripheral Vascular Balloon Angioplasty;  Surgeon: Serafina Mitchell, MD;  Location: [facility];  Service: Cardiovascular;;  left avf   PERIPHERAL VASCULAR BALLOON ANGIOPLASTY  09/02/2017   Procedure: PERIPHERAL VASCULAR BALLOON ANGIOPLASTY;  Surgeon: Conrad [place], MD;  Location: [facility];  Service: Cardiovascular;;  left AV Graft   PERIPHERAL VASCULAR BALLOON ANGIOPLASTY  12/21/2017   Procedure: PERIPHERAL VASCULAR BALLOON ANGIOPLASTY;  Surgeon: Serafina Mitchell, MD;  Location: [facility];  Service: Cardiovascular;;  INOMINATE / UPPER ARM AV GRAFT   PERIPHERAL VASCULAR BALLOON ANGIOPLASTY Left 05/12/2018   Procedure: PERIPHERAL VASCULAR BALLOON ANGIOPLASTY;  Surgeon: Marty Heck, MD;  Location: [facility];  Service: Cardiovascular;  Laterality: Left;  Arm fistula   PERIPHERAL VASCULAR BALLOON ANGIOPLASTY Left 10/05/2018   Procedure: PERIPHERAL VASCULAR BALLOON ANGIOPLASTY;  Surgeon: Marty Heck, MD;  Location: [facility];  Service: Cardiovascular;  Laterality: Left;  central and peripheral vein    PERIPHERAL VASCULAR BALLOON ANGIOPLASTY Left 04/18/2019   Procedure: PERIPHERAL VASCULAR BALLOON ANGIOPLASTY;  Surgeon: Serafina Mitchell, MD;  Location: [facility];  Service: Cardiovascular;  Laterality: Left;  arm fistula    TEE WITHOUT CARDIOVERSION N/A 03/06/2016   Procedure: TRANSESOPHAGEAL ECHOCARDIOGRAM (TEE);  Surgeon: Sueanne Margarita, MD;  Location: [facility] END[facility]COPY;  Service: Cardiovascular;  Laterality: N/A;   TUBAL LIGATION  11/1986   Current Outpatient Medications on File Prior to Visit  Medication Sig Dispense Refill   Accu-Chek Softclix Lancets lancets USE AS DIRECTED TO TEST DAILY 100 each 8   acetaminophen (TYLENOL) 650 MG CR tablet Take 1,300 mg by mouth 2 (two) times daily as needed for pain.     albuterol (PROVENTIL) (2.5 MG/3ML) 0.083% nebulizer solution Take 3 mLs (2.5 mg total) by nebulization every 6 (six) hours as needed for wheezing or shortness of breath. ICD 10:J45.20 (Patient taking differently: Take 2.5 mg by nebulization as needed for wheezing or shortness of breath.) 75 mL 0   albuterol (VENTOLIN HFA) 108 (90 Base) MCG/ACT inhaler INHALE 2 PUFFS BY MOUTH EVERY 6 HOURS AS NEEDED FOR WHEEZE OR SHORTNESS OF BREATH (Patient taking differently: Inhale 2 puffs into the lungs 4 (four) times daily as needed for wheezing or shortness of breath.) 54 each 1   aspirin 81 MG EC tablet Take 81 mg by mouth daily.     Blood Glucose Monitoring Suppl (ACCU-CHEK GUIDE) w/Device KIT 1 each by Does not apply route daily. Use as instructed to check blood sugar once daily. E11.22, N18.6, Z99.2 1 kit 0   budesonide-formoterol (SYMBICORT) 160-4.5 MCG/ACT inhaler Inhale 2 puffs into the lungs 2 (two) times daily for 1 day. 3 each 1   Cholecalciferol 25 MCG (1000 UT) capsule Take 3,000 Units by mouth in the morning and at bedtime.     cinacalcet (SENSIPAR) 30 MG tablet Take 30 mg by mouth daily.     Dulaglutide (TRULICITY) 2.54 YH/0.6CB SOPN ADMINISTER 0.75 MG UNDER THE SKIN 1 TIME A WEEK  (Patient taking differently: Inject 0.75 mg into the skin once a week.) 2 mL 0   fluticasone (FLONASE) 50 MCG/ACT nasal spray SPRAY 2 SPRAYS INTO EACH NOSTRIL EVERY DAY (Patient taking differently: Place 2 sprays into both nostrils daily.) 48 mL 1   glucose blood (ACCU-CHEK GUIDE) test strip USE TO TEST BLOOD SUGAR LEVELS DAILY 50 strip 1   ipratropium-albuterol (DUONEB) 0.5-2.5 (3) MG/3ML SOLN Take 3 mLs by nebulization as needed (wheezing/shortness of breath).     magnesium oxide (MAG-OX) 400 MG tablet Take 400 mg by mouth daily.     multivitamin (RENA-VIT) TABS tablet Take 1 tablet by mouth daily with lunch.      mycophenolate (MYFORTIC) 180 MG EC tablet Take 360 mg by mouth 2 (two) times daily.  pantoprazole (PROTONIX) 40 MG tablet TAKE 1 TABLET(40 MG) BY MOUTH TWICE DAILY (Patient taking differently: Take 40 mg by mouth daily.) 30 tablet 2   predniSONE (DELTASONE) 5 MG tablet Take 5 mg by mouth daily with breakfast.     RESTASIS 0.05 % ophthalmic emulsion Place 1 drop into both eyes daily as needed (redness/floaters).     rosuvastatin (CRESTOR) 20 MG tablet TAKE 1 TABLET(20 MG) BY MOUTH DAILY (Patient taking differently: Take 20 mg by mouth daily.) 30 tablet 0   tacrolimus ER (ENVARSUS XR) 1 MG TB24 Take 9 mg by mouth daily before breakfast.     No current facility-administered medications on file prior to visit.    Allergies  Allergen Reactions   Candesartan Anaphylaxis and Other (See Comments)    Swelling of the mouth and tongue.    Candesartan Cilexetil Anaphylaxis   Lisinopril Anaphylaxis and Other (See Comments)    Swelling of the mouth and tongue.   Nsaids Anaphylaxis and Other (See Comments)    Swelling of the mouth and tongue.   Penicillamine     Other Reaction(s): Not available   Metoprolol Tartrate Rash   Penicillins Rash and Other (See Comments)    Has patient had a PCN reaction causing immediate rash, facial/tongue/throat swelling, SOB or lightheadedness with  hypotension: No Has patient had a PCN reaction causing severe rash involving mucus membranes or skin necrosis: No Has patient had a PCN reaction that required hospitalization No Has patient had a PCN reaction occurring within the last 10 years: No If all of the above answers are "NO", then may proceed with Cephalosporin use.    Social History   Occupational History   Occupation: none  Tobacco Use   Smoking status: Never   Smokeless tobacco: Never  Vaping Use   Vaping Use: Never used  Substance and Sexual Activity   Alcohol use: Yes    Alcohol/week: 0.0 standard drinks of alcohol    Comment: occ - 1 drink rarely   Drug use: No   Sexual activity: Not Currently   Family History  Problem Relation Age of Onset   Hypertension Mother    Breast cancer Maternal Aunt    Deep vein thrombosis Daughter    Hyperlipidemia Daughter    Hypertension Daughter    Prostate cancer Maternal Grandmother    Immunization History  Administered Date(s) Administered   Influenza Split 11/22/2014   Influenza, Seasonal, Injecte, Preservative Fre 11/24/2016   Influenza,inj,Quad PF,6+ Mos 12/07/2012, 12/25/2013, 11/23/2017   Moderna Sars-Covid-2 Vaccination 11/24/2019   PPD Test 03/13/2015   Pneumococcal Polysaccharide-23 12/25/2013   Tdap 07/21/2012     Review of Systems: Negative except as noted in the HPI.   Objective: Vitals:   03/31/22 0922 03/31/22 0940  BP: (!) 200/98 (!) 205/118   Patient states she has not taken her blood pressure medication today. She denies any headache, dizziness, or vision changes. States she is having a lot of pain with her knee due to torn meniscus.  Rachael George is a pleasant 67 y.o. female in NAD. AAO X 3.  Vascular Examination: Capillary refill time immediate b/l. Vascular status intact b/l with palpable pedal pulses. Pedal hair present b/l. No edema. No pain with calf compression b/l. Skin temperature gradient WNL b/l.   Neurological  Examination: Sensation grossly intact b/l with 10 gram monofilament. Vibratory sensation intact b/l.   Dermatological Examination: Pedal skin with normal turgor, texture and tone b/l. Toenails 1-5 b/l thick, discolored, elongated with subungual  debris and pain on dorsal palpation. No open wounds b/l LE. No interdigital macerations noted b/l LE.  Musculoskeletal Examination: Muscle strength 5/5 to all lower extremity muscle groups bilaterally. HAV with bunion deformity noted b/l LE. Utilizes cane for ambulation assistance.  Radiographs: None  Last A1c:      Latest Ref Rng & Units 01/03/2022    4:33 PM  Hemoglobin A1C  Hemoglobin-A1c 4.8 - 5.6 % 8.4    Footwear Assessment: Does the patient wear appropriate shoes? Yes. Does the patient need inserts/orthotics? Yes.  ADA Risk Categorization: Low Risk :  Patient has all of the following: Intact protective sensation No prior foot ulcer  No severe deformity Pedal pulses present   Assessment: 1. Pain due to onychomycosis of toenails of both feet   2. Hallux valgus, acquired, bilateral   3. Kidney transplant recipient   4. Type 2 diabetes mellitus with other diabetic kidney complication, without long-term current use of insulin ([place])   5. Encounter for diabetic foot exam ([place])     Plan: -Consent given for treatment as described below: -Examined patient. -Discussed elevated blood pressure reading with patient. Patient advised to take blood pressure medication when they get home. Patient to repeat blood pressure at home and contact PCP/Cardiologist if it remains elevated. Patient/Family/Caregiver/POA related understanding. -Diabetic foot examination performed today. -Patient to continue soft, supportive shoe gear daily. -Mycotic toenails 1-5 bilaterally were debrided in length and girth with sterile nail nippers and dremel without incident. -Patient/POA to call should there be question/concern in the interim. Return in about 3  months (around 06/30/2022).  Marzetta Board, DPM

## 2022-04-13 ENCOUNTER — Other Ambulatory Visit: Payer: Self-pay | Admitting: Family Medicine

## 2022-04-13 DIAGNOSIS — Z1231 Encounter for screening mammogram for malignant neoplasm of breast: Secondary | ICD-10-CM

## 2022-05-13 ENCOUNTER — Ambulatory Visit: Payer: 59 | Admitting: Cardiology

## 2022-05-14 NOTE — Progress Notes (Addendum)
PCP - Dr.  Billey Gosling Cardiologist - has an appointment 3 -4-24 for clearance  with Dr. Glenetta Hew  PPM/ICD -  Device Orders -  Rep Notified -   Chest x-ray - 01-03-22 epic EKG - 01-05-22 epic Stress Test -  ECHO - 03-25-22  epic Cardiac Cath -   Sleep Study -  CPAP -   Fasting Blood Sugar - 110-117 Checks Blood Sugar __1___ times a day  Blood Thinner Instructions: Aspirin Instructions:  ERAS Protcol - PRE-SURGERY G2-   TRULICITY hold 1 week  [id number] last dose   COVID vaccine -yes  Activity--Able to complete ADL's without SOB does have SOB walking to car and back not new for pt. Has cardiac appt. 05-25-22 Anesthesia review: kidney transplant, HTN,DM, OSA ,Murmur  Patient denies shortness of breath, fever, cough and chest pain at PAT appointment   All instructions explained to the patient, with a verbal understanding of the material. Patient agrees to go over the instructions while at home for a better understanding. Patient also instructed to self quarantine after being tested for COVID-19. The opportunity to ask questions was provided.

## 2022-05-14 NOTE — Patient Instructions (Signed)
SURGICAL WAITING ROOM VISITATION  Patients having surgery or a procedure may have no more than 2 support people in the waiting area - these visitors may rotate.    Children under the age of 65 must have an adult with them who is not the patient.  Due to an increase in RSV and influenza rates and associated hospitalizations, children ages 40 and under may not visit patients in [place].  If the patient needs to stay at the hospital during part of their recovery, the visitor guidelines for inpatient rooms apply. Pre-op nurse will coordinate an appropriate time for 1 support person to accompany patient in pre-op.  This support person may not rotate.    Please refer to the [facility] website for the visitor guidelines for Inpatients (after your surgery is over and you are in a regular room).       Your procedure is scheduled on: 05-29-22   Report to [facility] Main Entrance    Report to admitting at     [place]  AM   Call this number if you have problems the morning of surgery [phone]   Do not eat food :After Midnight.   After Midnight you may have the following liquids until _0415_____ AM/DAY OF SURGERY  Water Non-Citrus Juices (without pulp, NO RED-Apple, White grape, White cranberry) Black Coffee (NO MILK/CREAM OR CREAMERS, sugar ok)  Clear Tea (NO MILK/CREAM OR CREAMERS, sugar ok) regular and decaf                             Plain Jell-O (NO RED)                                           Fruit ices (not with fruit pulp, NO RED)                                     Popsicles (NO RED)                                                               Sports drinks like Gatorade (NO RED)                   The day of surgery:  Drink ONE (1) Pre-Surgery G2 at 0415AM the morning of surgery. Drink in one sitting. Do not sip.  This drink was given to you during your hospital  pre-op appointment visit. Nothing else to drink after completing the  Pre-Surgery Clear   G2. By [id number]          If you have questions, please contact your surgeon's office.   FOLLOW  ANY ADDITIONAL PRE OP INSTRUCTIONS YOU RECEIVED FROM YOUR SURGEON'S OFFICE!!!     Oral Hygiene is also important to reduce your risk of infection.                                    Remember - BRUSH YOUR TEETH THE MORNING OF  SURGERY WITH YOUR REGULAR TOOTHPASTE  DENTURES WILL BE REMOVED PRIOR TO SURGERY PLEASE DO NOT APPLY "Poly grip" OR ADHESIVES!!!   Do NOT smoke after Midnight   Take these medicines the morning of surgery with A SIP OF WATER: Tacrolimus, rosuvastatin, prednisone, mycophenolate, cinacalcet, zyrtec, carvedilol, inhaler and bring rescue inhaler, amlodipine, nebulizer if needed, tylenol if needed  DO NOT TAKE ANY ORAL DIABETIC MEDICATIONS DAY OF YOUR SURGERY  Bring CPAP mask and tubing day of surgery.                              You may not have any metal on your body including hair pins, jewelry, and body piercing             Do not wear make-up, lotions, powders, perfumes/cologne, or deodorant  Do not wear nail polish including gel and S&S, artificial/acrylic nails, or any other type of covering on natural nails including finger and toenails. If you have artificial nails, gel coating, etc. that needs to be removed by a nail salon please have this removed prior to surgery or surgery may need to be canceled/ delayed if the surgeon/ anesthesia feels like they are unable to be safely monitored.   Do not shave  48 hours prior to surgery.                 Do not bring valuables to the hospital. [place].   Contacts, glasses, dentures or bridgework may not be worn into surgery.   Bring small overnight bag day of surgery.   DO NOT [place]. PHARMACY WILL DISPENSE MEDICATIONS LISTED ON YOUR MEDICATION LIST TO YOU DURING YOUR ADMISSION [place]!    Patients discharged on the day of  surgery will not be allowed to drive home.  Someone NEEDS to stay with you for the first 24 hours after anesthesia.   Special Instructions: Bring a copy of your healthcare power of attorney and living will documents the day of surgery if you haven't scanned them before.              Please read over the following fact sheets you were given: IF [place] [phone]   If you received a COVID test during your pre-op visit  it is requested that you wear a mask when out in public, stay away from anyone that may not be feeling well and notify your surgeon if you develop symptoms. If you test positive for Covid or have been in contact with anyone that has tested positive in the last 10 days please notify you surgeon.    [place] - Preparing for Surgery Before surgery, you can play an important role.  Because skin is not sterile, your skin needs to be as free of germs as possible.  You can reduce the number of germs on your skin by washing with CHG (chlorahexidine gluconate) soap before surgery.  CHG is an antiseptic cleaner which kills germs and bonds with the skin to continue killing germs even after washing. Please DO NOT use if you have an allergy to CHG or antibacterial soaps.  If your skin becomes reddened/irritated stop using the CHG and inform your nurse when you arrive at Short Stay. Do not shave (including legs and  underarms) for at least 48 hours prior to the first CHG shower.  You may shave your face/neck. Please follow these instructions carefully:  1.  Shower with CHG Soap the night before surgery and the  morning of Surgery.  2.  If you choose to wash your hair, wash your hair first as usual with your  normal  shampoo.  3.  After you shampoo, rinse your hair and body thoroughly to remove the  shampoo.                           4.  Use CHG as you would any other liquid soap.  You can apply chg directly  to the skin and wash                        Gently with a scrungie or clean washcloth.  5.  Apply the CHG Soap to your body ONLY FROM THE NECK DOWN.   Do not use on face/ open                           Wound or open sores. Avoid contact with eyes, ears mouth and genitals (private parts).                       Wash face,  Genitals (private parts) with your normal soap.             6.  Wash thoroughly, paying special attention to the area where your surgery  will be performed.  7.  Thoroughly rinse your body with warm water from the neck down.  8.  DO NOT shower/wash with your normal soap after using and rinsing off  the CHG Soap.                9.  Pat yourself dry with a clean towel.            10.  Wear clean pajamas.            11.  Place clean sheets on your bed the night of your first shower and do not  sleep with pets. Day of Surgery : Do not apply any lotions/deodorants the morning of surgery.  Please wear clean clothes to the hospital/surgery center.  FAILURE TO FOLLOW THESE INSTRUCTIONS MAY RESULT IN THE CANCELLATION OF YOUR SURGERY PATIENT SIGNATURE_________________________________  NURSE SIGNATURE__________________________________  ________________________________________________________________________  Adam [place]ix  An incentive spirometer is a tool that can help keep your lungs clear and active. This tool measures how well you are filling your lungs with each breath. Taking long deep breaths may help reverse or decrease the chance of developing breathing (pulmonary) problems (especially infection) following: A long period of time when you are unable to move or be active. BEFORE THE PROCEDURE  If the spirometer includes an indicator to show your best effort, your nurse or respiratory therapist will set it to a desired goal. If possible, sit up straight or lean slightly forward. Try not to slouch. Hold the incentive spirometer in an upright position. INSTRUCTIONS FOR USE  Sit on the edge of your bed if  possible, or sit up as far as you can in bed or on a chair. Hold the incentive spirometer in an upright position. Breathe out normally. Place the mouthpiece in your mouth and seal your lips tightly around it. Breathe in slowly and as  deeply as possible, raising the piston or the ball toward the top of the column. Hold your breath for 3-5 seconds or for as long as possible. Allow the piston or ball to fall to the bottom of the column. Remove the mouthpiece from your mouth and breathe out normally. Rest for a few seconds and repeat Steps 1 through 7 at least 10 times every 1-2 hours when you are awake. Take your time and take a few normal breaths between deep breaths. The spirometer may include an indicator to show your best effort. Use the indicator as a goal to work toward during each repetition. After each set of 10 deep breaths, practice coughing to be sure your lungs are clear. If you have an incision (the cut made at the time of surgery), support your incision when coughing by placing a pillow or rolled up towels firmly against it. Once you are able to get out of bed, walk around indoors and cough well. You may stop using the incentive spirometer when instructed by your caregiver.  RISKS AND COMPLICATIONS Take your time so you do not get dizzy or light-headed. If you are in pain, you may need to take or ask for pain medication before doing incentive spirometry. It is harder to take a deep breath if you are having pain. AFTER USE Rest and breathe slowly and easily. It can be helpful to keep track of a log of your progress. Your caregiver can provide you with a simple table to help with this. If you are using the spirometer at home, follow these instructions: [place] IF:  You are having difficultly using the spirometer. You have trouble using the spirometer as often as instructed. Your pain medication is not giving enough relief while using the spirometer. You develop fever of  100.5 F (38.1 C) or higher. SEEK IMMEDIATE MEDICAL CARE IF:  You cough up bloody sputum that had not been present before. You develop fever of 102 F (38.9 C) or greater. You develop worsening pain at or near the incision site. MAKE SURE YOU:  Understand these instructions. Will watch your condition. Will get help right away if you are not doing well or get worse. Document Released: 07/20/2006 Document Revised: 06/01/2011 Document Reviewed: 09/20/2006 [facility] Patient Information 2014 [place], [place].   ________________________________________________________________________

## 2022-05-18 ENCOUNTER — Other Ambulatory Visit: Payer: Self-pay

## 2022-05-18 ENCOUNTER — Encounter (HOSPITAL_COMMUNITY): Payer: Self-pay

## 2022-05-18 ENCOUNTER — Encounter (HOSPITAL_COMMUNITY)
Admission: RE | Admit: 2022-05-18 | Discharge: 2022-05-18 | Disposition: A | Discharge status: Home / Self Care | Payer: 59 | Source: Ambulatory Visit | Attending: Orthopedic Surgery | Admitting: Orthopedic Surgery

## 2022-05-18 DIAGNOSIS — I10 Essential (primary) hypertension: Secondary | ICD-10-CM | POA: Diagnosis not present

## 2022-05-18 DIAGNOSIS — E1151 Type 2 diabetes mellitus with diabetic peripheral angiopathy without gangrene: Secondary | ICD-10-CM | POA: Insufficient documentation

## 2022-05-18 DIAGNOSIS — Z01812 Encounter for preprocedural laboratory examination: Secondary | ICD-10-CM | POA: Insufficient documentation

## 2022-05-18 HISTORY — DX: Type 2 diabetes mellitus without complications: E11.9

## 2022-05-18 HISTORY — DX: Unspecified osteoarthritis, unspecified site: M19.90

## 2022-05-18 LAB — GLUCOSE, CAPILLARY: Glucose-Capillary: 142 mg/dL — ABNORMAL HIGH (ref 70–99)

## 2022-05-18 LAB — HEMOGLOBIN A1C
Hgb A1c MFr Bld: 7.3 % — ABNORMAL HIGH (ref 4.8–5.6)
Mean Plasma Glucose: 163 mg/dL

## 2022-05-18 LAB — CBC
HCT: 39.7 % (ref 36.0–46.0)
Hemoglobin: 11.8 g/dL — ABNORMAL LOW (ref 12.0–15.0)
MCH: 23.3 pg — ABNORMAL LOW (ref 26.0–34.0)
MCHC: 29.7 g/dL — ABNORMAL LOW (ref 30.0–36.0)
MCV: 78.5 fL — ABNORMAL LOW (ref 80.0–100.0)
Platelets: 162 10*3/uL (ref 150–400)
RBC: 5.06 MIL/uL (ref 3.87–5.11)
RDW: 15.5 % (ref 11.5–15.5)
WBC: 7 10*3/uL (ref 4.0–10.5)
nRBC: 0 % (ref 0.0–0.2)

## 2022-05-18 LAB — BASIC METABOLIC PANEL
Anion gap: 9 (ref 5–15)
BUN: 24 mg/dL — ABNORMAL HIGH (ref 8–23)
CO2: 22 mmol/L (ref 22–32)
Calcium: 9 mg/dL (ref 8.9–10.3)
Chloride: 107 mmol/L (ref 98–111)
Creatinine, Ser: 1.24 mg/dL — ABNORMAL HIGH (ref 0.44–1.00)
GFR, Estimated: 48 mL/min — ABNORMAL LOW (ref >60)
Glucose, Bld: 144 mg/dL — ABNORMAL HIGH (ref 70–99)
Potassium: 3.8 mmol/L (ref 3.5–5.1)
Sodium: 138 mmol/L (ref 135–145)

## 2022-05-25 ENCOUNTER — Ambulatory Visit: Payer: 59 | Attending: Cardiology | Admitting: Cardiology

## 2022-05-25 ENCOUNTER — Encounter: Payer: Self-pay | Admitting: Cardiology

## 2022-05-25 VITALS — BP 132/80 | HR 80 | Ht 61.0 in | Wt 188.2 lb

## 2022-05-25 DIAGNOSIS — I10 Essential (primary) hypertension: Secondary | ICD-10-CM

## 2022-05-25 DIAGNOSIS — E781 Pure hyperglyceridemia: Secondary | ICD-10-CM

## 2022-05-25 DIAGNOSIS — R079 Chest pain, unspecified: Secondary | ICD-10-CM | POA: Diagnosis not present

## 2022-05-25 DIAGNOSIS — R0602 Shortness of breath: Secondary | ICD-10-CM | POA: Diagnosis not present

## 2022-05-25 DIAGNOSIS — Z01810 Encounter for preprocedural cardiovascular examination: Secondary | ICD-10-CM | POA: Diagnosis not present

## 2022-05-25 DIAGNOSIS — I110 Hypertensive heart disease with heart failure: Secondary | ICD-10-CM

## 2022-05-25 DIAGNOSIS — E1165 Type 2 diabetes mellitus with hyperglycemia: Secondary | ICD-10-CM

## 2022-05-25 DIAGNOSIS — N186 End stage renal disease: Secondary | ICD-10-CM

## 2022-05-25 DIAGNOSIS — I3139 Other pericardial effusion (noninflammatory): Secondary | ICD-10-CM

## 2022-05-25 NOTE — Progress Notes (Signed)
Primary Care Provider: Sharmon Revere, MD [facility] Cardiologist: Bryan Lemma, MD Electrophysiologist: None  Clinic Note: Chief Complaint  Patient presents with   New Patient (Initial Visit)    Chest pain evaluation   Pre-op Exam   ===================================  ASSESSMENT/PLAN   Problem List Items Addressed This Visit       Cardiology Problems   Pericardial effusion    Upper reviewed the echocardiogram and I do not feel that the effusion was moderate.  Is mild and not causing any physiologic members effects.  Likely related to renal disease and rheumatoid arthritis.  Not likely causing any untoward symptoms.        Relevant Medications   amLODipine (NORVASC) 5 MG tablet   LVH (left ventricular hypertrophy) due to hypertensive disease, with heart failure (HCC) (Chronic)    Echo shows moderate LVH was not given longstanding hypertension.  She says that she has been hypertensive since she was in her 38s.  Echo was read as grade 1 diastolic dysfunction.  Clearly some diastolic function may be contributing to her exertional dyspnea but would not necessarily explain his fatigue.  Regardless, treatment would be blood pressure control and it is adequately controlled. She is not having PND orthopnea trivial if any edema.  Would defer diuretic to her nephrologist but she does not apparently require diuretic      Relevant Medications   amLODipine (NORVASC) 5 MG tablet   Other Relevant Orders   EKG 12-Lead (Completed)   Hypertriglyceridemia (Chronic)    Unable to calculate LDL because of elevated triglycerides.  More likely related to obesity and diabetes as well as diet.  Will follow closely and likely consider fish oil and even possibly Vascepa      Relevant Medications   amLODipine (NORVASC) 5 MG tablet   Essential hypertension, benign (Chronic)    BP looks pretty well-controlled on amlodipine 10 mg daily, carvedilol 12.5 mg twice daily.  Because of  renal concerns, not on ARB or ACE inhibitor.      Relevant Medications   amLODipine (NORVASC) 5 MG tablet   Other Relevant Orders   EKG 12-Lead (Completed)     Other   Shortness of breath (Chronic)    Probably multifactorial with deconditioning being a major part of it.  This has been in the process of being evaluated for some time now.  Once she is able to Rehabilitate from her knee, we can consider CPX evaluation to evaluate for cardiac or pulmonary etiology.  This seems to be a chronic issue for her though and is not acute.  Based on echo showing no gross abnormalities besides moderate LVH with diastolic function she has no clinical symptoms of CHF.      Pre-operative cardiovascular examination - Primary    Unfortunately, not a lot of time when I saw her and her upcoming surgery.  Thankfully, she is not having active symptoms that are concerning for berry standpoint-no active exertional angina or heart failure symptoms of PND, orthopnea edema.  No unstable arrhythmias.  As such, I do not see a benefit of doing any further testing since she just had an echocardiogram done in January showing normal function and had nonischemic Myoview done just about 5 years ago.    She is barely able to walk and needs her knee meniscectomy done.  This is a relatively low risk surgery cardiac standpoint and she should be fine proceeding with surgery as is.  We can then reassess once she is able  to walk to see if there is any active exertional chest pain symptoms.  It seems like her symptoms have been persistent for couple years now.  Pausing to do a stress test would not be beneficial as it would not allow her to have her surgery-therefore delaying care for her returning back to exercise.  Recommendations:  Proceed to the OR for left knee arthroscopic surgery and discectomy without further cardiac evaluation. Based on Revised Cardiac Risk Index will be low risk patient for low risk surgery. Continue  amlodipine and carvedilol perioperatively along with statin for cardioprotective effect. If issues arise, cardiology will be available if necessary postop.       Relevant Orders   EKG 12-Lead (Completed)   Poorly controlled type 2 diabetes mellitus (HCC)    A1c is 7 on Trulicity but does not currently have insulin on board-therefore not considered to be risk factor for preop assessment.      ESRD (end stage renal disease) (HCC) (Chronic)    She had history of a end-stage renal disease with status post renal transplant.  Creatinine is relatively stable, but did have a slight bump back in October.  With her renal function being of major vital importance, would be reluctant to use IV contrast either for Coronary CTA or cardiac catheterization unless clearly indicated.  In the absence of chest pain or pressure with I would not recommend cardiac catheterization, therefore I would not recommend stress testing at this point.  Can reassess at follow-up when she is able to walk more.      Chest pain of uncertain etiology    She seems to be having intermittent episodes of chest discomfort that are off and on and happen rarely.  Not necessarily associate particular activity it does move down her arm but does not sound consistent with angina.  It mostly occurs at rest.  Would very unlikely to have intermittent bouts of angina at rest that would not be exacerbated by exertion.  This is in conjunction with some exertional dyspnea which is longstanding.  Echocardiogram relatively preserved function.  Would like to reassess when she is able to walk more, but the symptoms do not seem to be consistent with angina and I would not recommend further evaluation prior to her surgery.      ===================================  HPI:    LEESHA KYZER is a 67 y.o. female with a PMH notable for HTN, HLD, DM-2, RA, ESRD-Renal Transplant May 2021, GERD, OSA (not using CPAP), asthma and obesity who presents today  for Cardiology Evaluation of Chest Pain/Preoperative. [name] is being seen today for the evaluation of  at the request of Sharmon Revere, MD.  [name] was last seen at [facility] by Dr. Alcario Drought with symptoms concerning for angina.  And need for cardiac clearance.  For now explanation of symptoms was given.  Recent Hospitalizations:  10/14-17/2023: Admitted bilateral knee weakness noted that she was on chronic prednisone for renal transplant and symptoms began after finishing a 5 days course of higher dose.  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  Also noted dysuria and chills.  This was in conjunction with AKI with creatinine of 2.73 on arrival.  Her home BP meds were held to allow for recovery of renal function.  She was seen on December 4 At [facility] for knee pain-follow-up visit on the 29th noted that she had to have a meniscal tear of the medial meniscus plan was for arthroscopic surgery with medial meniscectomy on 05/29/2022  with Dr. Aundria Rud.  Reviewed  CV studies:    The following studies were reviewed today: (if available, images/films reviewed: From Epic Chart or Care Everywhere) Lexiscan Myoview [facility]) 04/29/2017- LOW RISK STUDY.  Small size mild severity minimally reversible defect in the apical anterior and mid anterior segment consistent with artifact versus breast attenuation--normal function with no RWMA and absence of coronary calcification would suggest breast attenuation over ischemia. => No significant cardiac calcification with normal post-rest function In the future, if she requires an Imaging Study - I would probably lean toward Cardiac stress PET to avoid the potential for breast attenuation.  TTE ([facility]) 07/16/2020: Hyperdynamic LV, EF> 70%.  Sigmoid septum.  Normal wall motion.  Abnormal relaxation.  Normal RV.  AOV sclerosis.  Moderate to severe MAC with trace MR.  Trace TR.  TTE 03/31/2022:  EF 60-65%.  No RWMA. Mod LVH. Gr 1 DD.  Mild LA  dilation.  Normal RV size and function.  Mild-moderate pericardial effusion but no evidence of tamponade.  Aortic valve calcification/sclerosis with no stenosis.  Normal RAP.  No significant change (on my review - effusion is not MODERATE)  Interval History:   FRANKYE FALLA is a here today the week of her surgery for preop assessment.  Unfortunately due to rescheduling there was no acknowledgment that this probably should have it done sooner.  Thankfully, she seems to be doing relatively well with no major symptoms.  Right now her major complaint is the left knee pain, and is having to use a walker.  She describes having rare off-and-on central chest discomfort that sometimes a little to her arm.  If usually at rest it does not necessarily occur with exertion.  She says some days she is able to walk around with no problem, and then if she goes up and down stairs or carrying something heavy she may get little short of breath but has not noted any real chest discomfort.  Was really the issue is this chronic shortness of breath and easy fatigability has been going on for several years now as indicated by the studies done above.  He says it for the most part her inhalers help her dyspnea.  She is not having active resting chest pain that is happening with any consistency.  Nor she having any exertional chest pain is happening with consistency.  The major symptom is dyspnea.  No PND orthopnea.  She has some mild ankle swelling on the left side related to her knee and also has some left arm pain with significant patulous tissue of the left arm that does not really seem to be edema.  Other chronic review of symptoms are relatively normal as noted.  CV Review of Symptoms (Summary): positive for - off-and-on chest discomfort not necessarily associate with exertion.  Random, not frequent.  Chronic exertional dyspnea and easy fatigue. negative for - edema, irregular heartbeat, orthopnea, palpitations,  paroxysmal nocturnal dyspnea, rapid heart rate, shortness of breath, or syncope or near syncope, TIA fugax or claudication.  REVIEWED OF SYSTEMS   Review of Systems  Constitutional:  Positive for malaise/fatigue. Negative for weight loss.  HENT:  Negative for nosebleeds.   Respiratory:  Positive for shortness of breath (Exertional as noted). Negative for cough and wheezing.   Gastrointestinal:  Negative for blood in stool and melena.  Genitourinary:  Negative for hematuria.  Musculoskeletal:  Positive for joint pain (Left knee pain).  Neurological:  Negative for dizziness and focal weakness.  Psychiatric/Behavioral: Negative.  All other systems reviewed and are negative.  I have reviewed and (if needed) personally updated the patient's problem list, medications, allergies, past medical and surgical history, social and family history.   PAST MEDICAL HISTORY   Past Medical History:  Diagnosis Date   Anemia    Arthritis    Asthma    End-stage renal disease (ESRD) (HCC)    Renal transplant May 2021   GERD (gastroesophageal reflux disease)    Gout    Heart murmur    born with it- nothing to worry about   Heavy menstrual bleeding    History of blood transfusion 02/2016- last one   1990's- several ones    History of hiatal hernia    History of kidney stones 1980's   Hyperlipemia 12/07/2012   Hypertension    Hypothyroidism    Kidney transplant recipient 2021   Multiple gastric ulcers    Pneumonia 2012   Poorly controlled type II diabetes mellitus with renal complication (HCC)    type 2   Prediabetes    Sleep apnea    no cpap had weight loss    PAST SURGICAL HISTORY   Past Surgical History:  Procedure Laterality Date   A/V SHUNTOGRAM N/A 09/08/2016   Procedure: A/V Shuntogram - Left Arm AV Graft;  Surgeon: Nada Libman, MD;  Location: [facility] INVASIVE CV LAB;  Service: Cardiovascular;  Laterality: N/A;   A/V SHUNTOGRAM N/A 09/02/2017   Procedure: A/V SHUNTOGRAM - Left  Arm AVG;  Surgeon: Fransisco Hertz, MD;  Location: [facility] INVASIVE CV LAB;  Service: Cardiovascular;  Laterality: N/A;   A/V SHUNTOGRAM N/A 12/21/2017   Procedure: A/V SHUNTOGRAM - left arm;  Surgeon: Nada Libman, MD;  Location: [facility] INVASIVE CV LAB;  Service: Cardiovascular;  Laterality: N/A;   A/V SHUNTOGRAM Left 10/05/2018   Procedure: A/V SHUNTOGRAM;  Surgeon: Cephus Shelling, MD;  Location: [facility] INVASIVE CV LAB;  Service: Cardiovascular;  Laterality: Left;   ABDOMINAL HYSTERECTOMY  2000   AV FISTULA PLACEMENT Left 05/25/2016   Procedure: INSERTION OF LEFT UPPER ARM ARTERIOVENOUS (AV) LOOP GORE-TEX GRAFT ARM;  Surgeon: Sherren Kerns, MD;  Location: [facility] OR;  Service: Vascular;  Laterality: Left;   BASCILIC VEIN TRANSPOSITION Right 12/06/2014   Procedure: FIRST STAGE BASILIC VEIN TRANSPOSITION - RIGHT;  Surgeon: Nada Libman, MD;  Location: [facility] OR;  Service: Vascular;  Laterality: Right;   BASCILIC VEIN TRANSPOSITION Right 02/07/2015   Procedure: RIGHT ARM SECOND STAGE BASILIC VEIN TRANSPOSITION;  Surgeon: Nada Libman, MD;  Location: [facility] OR;  Service: Vascular;  Laterality: Right;   BASCILIC VEIN TRANSPOSITION Left 04/22/2016   Procedure: FIRST STAGE BASILIC VEIN TRANSPOSITION LEFT ARM;  Surgeon: Fransisco Hertz, MD;  Location: [facility] OR;  Service: Vascular;  Laterality: Left;   COLONOSCOPY W/ POLYPECTOMY     ECTOPIC PREGNANCY SURGERY  02/1982   EXCHANGE OF A DIALYSIS CATHETER Right 04/22/2016   Procedure: EXCHANGE OF A DIALYSIS CATHETER - INSERTION RIGHT INTERNAL JUGULAR & REMOVAL FROM LEFT INTERNAL JUGULAR;  Surgeon: Fransisco Hertz, MD;  Location: [facility] OR;  Service: Vascular;  Laterality: Right;   I & D EXTREMITY Right 02/07/2015   Procedure: IRRIGATION AND DEBRIDEMENT RIGHT ARM HEMATOMA;  Surgeon: Nada Libman, MD;  Location: [facility] OR;  Service: Vascular;  Laterality: Right;   Incision and debridement     post kidneytransplant surgery went home with wound vac   INSERTION OF DIALYSIS CATHETER   03/03/2016   Procedure: INSERTION OF DIALYSIS  CATHETER;  Surgeon: Sherren Kerns, MD;  Location: [facility] OR;  Service: Vascular;;   IR AV DIALY SHUNT INTRO NEEDLE/INTRACATH INITIAL W/PTA/IMG LEFT  04/20/2017   IR AV DIALY SHUNT INTRO NEEDLE/INTRACATH INITIAL W/PTA/IMG LEFT  06/24/2017   IR THROMBECTOMY AV FISTULA W/THROMBOLYSIS/PTA INC/SHUNT/IMG LEFT Left 01/04/2017   IR US GUIDE VASC ACCESS LEFT  01/04/2017   kidney tranplant  2021   left shoulder surgery  08/2005   LIGATION OF ARTERIOVENOUS  FISTULA  03/03/2016   Procedure: LIGATION OF ARTERIOVENOUS  FISTULA;  Surgeon: Sherren Kerns, MD;  Location[facility] OR;  Service: Vascular;;   LIGATION OF ARTERIOVENOUS  FISTULA Right 09/06/2018   Procedure: Resection of pseudoaneursym and end to end repair of right brachial artery;  Surgeon: Sherren Kerns, MD;  Location: [facility] OR;  Service: Vascular;  Laterality: Right;   NM MYOVIEW LTD  04/2017   Lexiscan Myoview [facility]): LOW RISK STUDY.  Small size mild severity minimally reversible defect in the apical anterior and mid anterior segment consistent with artifact versus breast attenuation--normal function with no RWMA and absence of coronary calcification would suggest breast attenuation over ischemia. => No significant cardiac calcification with normal post-rest function   PERIPHERAL VASCULAR BALLOON ANGIOPLASTY  09/08/2016   Procedure: Peripheral Vascular Balloon Angioplasty;  Surgeon: Nada Libman, MD;  Location: [facility] INVASIVE CV LAB;  Service: Cardiovascular;;  left avf   PERIPHERAL VASCULAR BALLOON ANGIOPLASTY  09/02/2017   Procedure: PERIPHERAL VASCULAR BALLOON ANGIOPLASTY;  Surgeon: Fransisco Hertz, MD;  Location: [facility] INVASIVE CV LAB;  Service: Cardiovascular;;  left AV Graft   PERIPHERAL VASCULAR BALLOON ANGIOPLASTY  12/21/2017   Procedure: PERIPHERAL VASCULAR BALLOON ANGIOPLASTY;  Surgeon: Nada Libman, MD;  Location: [facility]VAS[facility];  Service: Cardiovascular;;  INOMINATE / UPPER ARM AV GRAFT    PERIPHERAL VASCULAR BALLOON ANGIOPLASTY Left 05/12/2018   Procedure: PERIPHERAL VASCULAR BALLOON ANGIOPLASTY;  Surgeon: Cephus Shelling, MD;  Location: [facility]VAS[facility] CV LAB;  Service: Cardiovascular;  Laterality: Left;  Arm fistula   PERIPHERAL VASCULAR BALLOON ANGIOPLASTY Left 10/05/2018   Procedure: PERIPHERAL VASCULAR BALLOON ANGIOPLASTY;  Surgeon: Cephus Shelling, MD;  Location: [facility] INVASIVE CV LAB;  Service: Cardiovascular;  Laterality: Left;  central and peripheral vein   PERIPHERAL VASCULAR BALLOON ANGIOPLASTY Left 04/18/2019   Procedure: PERIPHERAL VASCULAR BALLOON ANGIOPLASTY;  Surgeon: Nada Libman, MD;  Location: [facility] INVASIVE CV LAB;  Service: Cardiovascular;  Laterality: Left;  arm fistula    TEE WITHOUT CARDIOVERSION N/A 03/06/2016   Procedure: TRANSESOPHAGEAL ECHOCARDIOGRAM (TEE);  Surgeon: Quintella Reichert, MD;  Location: [facility] ENDOSCOPY;  Service: Cardiovascular;  Laterality: N/A;   TRANSTHORACIC ECHOCARDIOGRAM  07/16/2020   TTE ([facility]) 07/16/2020: Hyperdynamic LV, EF> 70%.  Sigmoid septum.  Normal wall motion.  Abnormal relaxation.  Normal RV.  AOV sclerosis.  Moderate to severe MAC with trace MR.  Trace TR.   TRANSTHORACIC ECHOCARDIOGRAM  03/31/2022   [place]:   EF 60-65%.  No RWMA. Mod LVH. Gr 1 DD.  Mild LA dilation.  Normal RV size and function.  Mild-moderate pericardial effusion but no evidence of tamponade.  Aortic valve calcification/sclerosis with no stenosis.  Normal RAP.  No significant change (on my review - effusion is not MODERATE)   TUBAL LIGATION  11/1986    Immunization History  Administered Date(s) Administered   Influenza Split 11/22/2014   Influenza, Seasonal, Injecte, Preservative Fre 11/24/2016   Influenza,inj,Quad PF,6+ Mos 12/07/2012, 12/25/2013, 11/23/2017   Moderna Sars-Covid-2 Vaccination 11/24/2019  PPD Test 03/13/2015   Pneumococcal Polysaccharide-23 12/25/2013   Tdap 07/21/2012    MEDICATIONS/ALLERGIES   Current Meds   Medication Sig   Accu-Chek Softclix Lancets lancets USE AS DIRECTED TO TEST DAILY   acetaminophen (TYLENOL) 650 MG CR tablet Take 1,300 mg by mouth 2 (two) times daily as needed for pain.   albuterol (PROVENTIL) (2.5 MG/3ML) 0.083% nebulizer solution Take 3 mLs (2.5 mg total) by nebulization every 6 (six) hours as needed for wheezing or shortness of breath. ICD 10:J45.20   albuterol (VENTOLIN HFA) 108 (90 Base) MCG/ACT inhaler INHALE 2 PUFFS BY MOUTH EVERY 6 HOURS AS NEEDED FOR WHEEZE OR SHORTNESS OF BREATH   amLODipine (NORVASC) 5 MG tablet Take 1 tablet by mouth at bedtime.   aspirin 81 MG EC tablet Take 81 mg by mouth daily.   Blood Glucose Monitoring Suppl (ACCU-CHEK GUIDE) w/Device KIT 1 each by Does not apply route daily. Use as instructed to check blood sugar once daily. E11.22, N18.6, Z99.2   carvedilol (COREG) 12.5 MG tablet Take 12.5 mg by mouth 2 (two) times daily with a meal.   cetirizine (ZYRTEC) 10 MG tablet Take 10 mg by mouth daily.   Cholecalciferol 25 MCG (1000 UT) capsule Take 3,000 Units by mouth daily.   cinacalcet (SENSIPAR) 30 MG tablet Take 30 mg by mouth daily.   Dulaglutide (TRULICITY) 0.75 MG/0.5ML SOPN ADMINISTER 0.75 MG UNDER THE SKIN 1 TIME A WEEK   fluticasone (FLONASE) 50 MCG/ACT nasal spray SPRAY 2 SPRAYS INTO EACH NOSTRIL EVERY DAY   glucose blood (ACCU-CHEK GUIDE) test strip USE TO TEST BLOOD SUGAR LEVELS DAILY   ketoconazole (NIZORAL) 2 % cream Apply 1 Application topically daily as needed for irritation.   magnesium oxide (MAG-OX) 400 MG tablet Take 400 mg by mouth daily.   Multiple Vitamins-Minerals (CENTRUM SILVER 50+WOMEN) TABS Take 1 tablet by mouth daily.   mycophenolate (MYFORTIC) 180 MG EC tablet Take 360 mg by mouth 2 (two) times daily.   pantoprazole (PROTONIX) 40 MG tablet TAKE 1 TABLET(40 MG) BY MOUTH TWICE DAILY (Patient taking differently: Take 40 mg by mouth daily.)   predniSONE (DELTASONE) 5 MG tablet Take 5 mg by mouth daily with breakfast.    rosuvastatin (CRESTOR) 20 MG tablet TAKE 1 TABLET(20 MG) BY MOUTH DAILY   tacrolimus ER (ENVARSUS XR) 1 MG TB24 Take 9 mg by mouth daily before breakfast.   triamcinolone cream (KENALOG) 0.1 % Apply 1 Application topically daily as needed (eczema).    Allergies  Allergen Reactions   Candesartan Anaphylaxis and Other (See Comments)    Swelling of the mouth and tongue.    Candesartan Cilexetil Anaphylaxis   Lisinopril Anaphylaxis and Other (See Comments)    Swelling of the mouth and tongue.   Nsaids Anaphylaxis and Other (See Comments)    Swelling of the mouth and tongue.   Penicillamine     Other Reaction(s): Not available   Metoprolol Tartrate Rash   Penicillins Rash and Other (See Comments)    Has patient had a PCN reaction causing immediate rash, facial/tongue/throat swelling, SOB or lightheadedness with hypotension: No Has patient had a PCN reaction causing severe rash involving mucus membranes or skin necrosis: No Has patient had a PCN reaction that required hospitalization No Has patient had a PCN reaction occurring within the last 10 years: No If all of the above answers are "NO", then may proceed with Cephalosporin use.     SOCIAL HISTORY/FAMILY HISTORY   Reviewed in Epic:  Pertinent  findings:  Social History   Tobacco Use   Smoking status: Never   Smokeless tobacco: Never  Vaping Use   Vaping Use: Never used  Substance Use Topics   Alcohol use: Not Currently    Comment: occ - 1 drink rarely   Drug use: No   Social History   Social History Narrative      Home Situation:  3 grand-children lives with patient      Spiritual Beliefs: baptist                OBJCTIVE -PE, EKG, labs   Wt Readings from Last 3 Encounters:  05/25/22 188 lb 3.2 oz (85.4 kg)  05/18/22 180 lb (81.6 kg)  01/03/22 186 lb (84.4 kg)    Physical Exam: BP 132/80   Pulse 80   Ht 5\' 1"  (1.549 m)   Wt 188 lb 3.2 oz (85.4 kg)   SpO2 98%   BMI 35.56 kg/m  Physical  Exam Vitals reviewed.  Constitutional:      General: She is not in acute distress.    Appearance: Normal appearance. She is obese. She is not ill-appearing or toxic-appearing.  HENT:     Head: Normocephalic and atraumatic.  Neck:     Vascular: No carotid bruit.  Cardiovascular:     Rate and Rhythm: Normal rate and regular rhythm. No extrasystoles are present.    Chest Wall: PMI is not displaced.     Pulses: Intact distal pulses.     Heart sounds: S1 normal and S2 normal. Heart sounds are distant. Murmur (1/6 SEM RUSB) heard.     No friction rub. No gallop (Distant heart sounds, difficult to truly assess if there is an S4.).  Pulmonary:     Effort: Pulmonary effort is normal. No respiratory distress.     Breath sounds: Normal breath sounds. No wheezing, rhonchi or rales.  Chest:     Chest wall: Tenderness (She does have bilateral parasternal discomfort is reproducible) present.  Musculoskeletal:        General: Swelling (Trivial.  Has swelling of the left knee as well.) present.     Cervical back: Normal range of motion and neck supple.  Skin:    General: Skin is warm and dry.  Neurological:     General: No focal deficit present.     Mental Status: She is alert and oriented to person, place, and time.     Gait: Gait abnormal (Antalgic gait because of her knee).  Psychiatric:        Mood and Affect: Mood normal.        Behavior: Behavior normal.        Thought Content: Thought content normal.        Judgment: Judgment normal.     Adult ECG Report  Rate: 80;  Rhythm: normal sinus rhythm and nonspecific ST and T wave changes. ;   Narrative Interpretation: Essentially normal  Recent Labs: Reviewed.  Has had labs checked in January by PCP. 04/11/2022: Na+ 139, K+ 4.0, Cl- 103, HCO3-25, BUN 24, Cr 1.35, Glu 134, Ca2+ 9.3; AST 21, ALT 23, AlkP 104 CBC: W 6.7, H/H 12.1/38.3, Plt 153 TC 190, TG 483, HDL 36, LDL unable to calculate due to triglycerides   Lab Results  Component  Value Date   CHOL 212 (H) 06/17/2020   HDL 44 06/17/2020   LDLCALC 120 (H) 06/17/2020   LDLDIRECT 130.7 04/05/2013   TRIG 277 (H) 06/17/2020   CHOLHDL 4.8 (H) 06/17/2020  Lab Results  Component Value Date   CREATININE 1.24 (H) 05/18/2022   BUN 24 (H) 05/18/2022   NA 138 05/18/2022   K 3.8 05/18/2022   CL 107 05/18/2022   CO2 22 05/18/2022      Latest Ref Rng & Units 05/18/2022   10:46 AM 01/06/2022    4:10 AM 01/05/2022    5:04 AM  CBC  WBC 4.0 - 10.5 K/uL 7.0  7.0  7.0    7.2   Hemoglobin 12.0 - 15.0 g/dL 86.5  8.9  9.0    9.1   Hematocrit 36.0 - 46.0 % 39.7  28.0  27.3    27.8   Platelets 150 - 400 K/uL 162  150  128    140     Lab Results  Component Value Date   HGBA1C 7.3 (H) 05/18/2022   Lab Results  Component Value Date   TSH 0.344 (L) 01/03/2022    ================================================== I spent a total of 38 minutes with the patient spent in direct patient consultation.  Additional time spent with chart review  / charting (studies, outside notes, etc): 32 min Total Time: 70 min  Current medicines are reviewed at length with the patient today.  (+/- concerns) none  Notice: This dictation was prepared with Dragon dictation along with smart phrase technology. Any transcriptional errors that result from this process are unintentional and may not be corrected upon review.  Studies Ordered:   Orders Placed This Encounter  Procedures   EKG 12-Lead   No orders of the defined types were placed in this encounter.   Patient Instructions / Medication Changes & Studies & Tests Ordered   Patient Instructions  Medication Instructions:  No changes  *If you need a refill on your cardiac medications before your next appointment, please call your pharmacy*   Lab Work: Not needed    Testing/Procedures: Not needed   Follow-Up: At [facility], you and your health needs are our priority.  As part of our continuing mission to provide you  with exceptional heart care, we have created designated Provider Care Teams.  These Care Teams include your primary Cardiologist (physician) and Advanced Practice Providers (APPs -  Physician Assistants and Nurse Practitioners) who all work together to provide you with the care you need, when you need it.     Your next appointment:   3 month(s)  The format for your next appointment:   In Person  Provider:   Bryan Lemma, MD     Other Instructions    You are cleared to have  knee surgery  with Dr Duwayne Heck -  without further cardiac workup      Marykay Lex, MD, MS Bryan Lemma, M.D., M.S. Interventional Cardiologist  [facility]  Pager # [phone] Phone # [phone] [address] [place], [place] [address]   Thank you for choosing [facility] [place]!!

## 2022-05-25 NOTE — Patient Instructions (Signed)
Medication Instructions:  No changes  *If you need a refill on your cardiac medications before your next appointment, please call your pharmacy*   Lab Work: Not needed    Testing/Procedures: Not needed   Follow-Up: At [facility], you and your health needs are our priority.  As part of our continuing mission to provide you with exceptional heart care, we have created designated Provider Care Teams.  These Care Teams include your primary Cardiologist (physician) and Advanced Practice Providers (APPs -  Physician Assistants and Nurse Practitioners) who all work together to provide you with the care you need, when you need it.     Your next appointment:   3 month(s)  The format for your next appointment:   In Person  Provider:   Glenetta Hew, MD     Other Instructions    You are cleared to have  knee surgery  with Dr Victorino December -  without further cardiac workup

## 2022-05-25 NOTE — Progress Notes (Signed)
Anesthesia Chart Review   Case: [id number] Date/Time: 05/29/22 0700   Procedure: KNEE ARTHROSCOPY WITH MEDIAL MENISCAL ROOT REPAIR (Left: Knee) - 67   Anesthesia type: Choice   Pre-op diagnosis: Left knee medial meniscus root tear   Location: [facility] ROOM 08 / [facility]   Surgeons: Nicholes Stairs, MD       DISCUSSION:67 y.o. never smoker with h/o HTN, asthma, DM II, sleep apnea, ESRD s/p kidney transplant, left knee medial meniscus tear scheduled for above procedure 05/29/2022 with Dr. Victorino December.   Pt last seen by cardiology 05/25/2022. Per note, "Rachael George is at low/moderate risk, from a cardiac standpoint,for her upcoming procedure- KNEE ARTHROSCOPY WITH MEDIAL MENISCAL ROOT REPAIR. She has no active symptoms at present time. It is ok to proceed without further cardiac testing."  Anticipate pt can proceed with planned procedure barring acute status change.   VS: BP (!) 143/81 (BP Location: Right Wrist)   Pulse 89   Temp 36.8 C (Oral)   Resp 17   Ht '5\' 1"'$  (1.549 m)   Wt 81.6 kg   SpO2 100%   BMI 34.01 kg/m   PROVIDERS: Loura Pardon, MD is PCP   Glenetta Hew, MD is Cardiologist  LABS: Labs reviewed: Acceptable for surgery. (all labs ordered are listed, but only abnormal results are displayed)  Labs Reviewed  HEMOGLOBIN A1C - Abnormal; Notable for the following components:      Result Value   Hgb A1c MFr Bld 7.3 (*)    All other components within normal limits  BASIC METABOLIC PANEL - Abnormal; Notable for the following components:   Glucose, Bld 144 (*)    BUN 24 (*)    Creatinine, Ser 1.24 (*)    GFR, Estimated 48 (*)    All other components within normal limits  CBC - Abnormal; Notable for the following components:   Hemoglobin 11.8 (*)    MCV 78.5 (*)    MCH 23.3 (*)    MCHC 29.7 (*)    All other components within normal limits  GLUCOSE, CAPILLARY - Abnormal; Notable for the following components:   Glucose-Capillary 142 (*)    All other components  within normal limits     IMAGES:   EKG:   CV: Echo 03/25/22 1. Left ventricular ejection fraction, by estimation, is 60 to 65%. The  left ventricle has normal function. The left ventricle has no regional  wall motion abnormalities. There is moderate concentric left ventricular  hypertrophy. Left ventricular  diastolic parameters are consistent with Grade I diastolic dysfunction  (impaired relaxation).   2. Right ventricular systolic function is normal. The right ventricular  size is normal. There is normal pulmonary artery systolic pressure.   3. Left atrial size was mildly dilated.   4. Moderate pericardial effusion. The pericardial effusion is posterior  to the left ventricle and the left atrium. There is no evidence of cardiac  tamponade.   5. No evidence of mitral valve regurgitation. Moderate mitral annular  calcification.   6. Aortic valve regurgitation is mild. Aortic valve  sclerosis/calcification is present, without any evidence of aortic  stenosis.   7. The inferior vena cava is normal in size with greater than 50%  respiratory variability, suggesting right atrial pressure of 3 mmHg.  Past Medical History:  Diagnosis Date   Anemia    Arthritis    Asthma    Diabetes mellitus without complication ([place])    type 2   ESRD (end stage renal disease) (  [place])    MWF   GERD (gastroesophageal reflux disease)    Gout    Heart murmur    born with it- nothing to worry about   Heavy menstrual bleeding    High cholesterol    History of blood transfusion 02/2016- last one   1990's- several ones    History of hiatal hernia    History of kidney stones 1980's   Hyperlipemia 12/07/2012   Hypertension    Hypothyroidism    Kidney transplant recipient 2021   Multiple gastric ulcers    Pneumonia 2012   Prediabetes    Sleep apnea    no cpap had weight loss    Past Surgical History:  Procedure Laterality Date   A/V SHUNTOGRAM N/A 09/08/2016   Procedure: A/V Shuntogram -  Left Arm AV Graft;  Surgeon: Serafina Mitchell, MD;  Location: [place];  Service: Cardiovascular;  Laterality: N/A;   A/V SHUNTOGRAM N/A 09/02/2017   Procedure: A/V SHUNTOGRAM - Left Arm AVG;  Surgeon: Conrad [place], MD;  Location: [place];  Service: Cardiovascular;  Laterality: N/A;   A/V SHUNTOGRAM N/A 12/21/2017   Procedure: A/V SHUNTOGRAM - left arm;  Surgeon: Serafina Mitchell, MD;  Location: [place] [facility];  Service: Cardiovascular;  Laterality: N/A;   A/V SHUNTOGRAM Left 10/05/2018   Procedure: A/V SHUNTOGRAM;  Surgeon: Marty Heck, MD;  Location: [place] [facility];  Service: Cardiovascular;  Laterality: Left;   ABDOMINAL HYSTERECTOMY  2000   AV FISTULA PLACEMENT Left 05/25/2016   Procedure: INSERTION OF LEFT UPPER ARM ARTERIOVENOUS (AV) LOOP GORE-TEX GRAFT ARM;  Surgeon: Elam Dutch, MD;  Location: [facility];  Service: Vascular;  Laterality: Left;   [place] Right 12/06/2014   Procedure: FIRST STAGE BASILIC VEIN TRANSPOSITION - RIGHT;  Surgeon: Serafina Mitchell, MD;  Location: [place];  Service: Vascular;  Laterality: Right;   [place] Right 02/07/2015   Procedure: RIGHT ARM SECOND STAGE BASILIC VEIN TRANSPOSITION;  Surgeon: Serafina Mitchell, MD;  Location: [place];  Service: Vascular;  Laterality: Right;   [place] Left 04/22/2016   Procedure: FIRST STAGE BASILIC VEIN TRANSPOSITION LEFT ARM;  Surgeon: Conrad [place], MD;  Location: [place];  Service: Vascular;  Laterality: Left;   COLONOSCOPY W/ POLYPECTOMY     ECTOPIC PREGNANCY SURGERY  02/1982   EXCHANGE OF A DIALYSIS CATHETER Right 04/22/2016   Procedure: EXCHANGE OF A DIALYSIS CATHETER - INSERTION RIGHT INTERNAL JUGULAR & REMOVAL FROM LEFT INTERNAL JUGULAR;  Surgeon: Conrad [place], MD;  Location: [place];  Service: Vascular;  Laterality: Right;   I & D EXTREMITY Right 02/07/2015   Procedure: IRRIGATION AND DEBRIDEMENT RIGHT ARM HEMATOMA;  Surgeon: Serafina Mitchell, MD;  Location: [place];  Service: Vascular;  Laterality: Right;   Incision and debridement     post kidneytransplant surgery went home with wound vac   INSERTION OF DIALYSIS CATHETER  03/03/2016   Procedure: INSERTION OF DIALYSIS CATHETER;  Surgeon: Elam Dutch, MD;  Location: [place];  Service: Vascular;;   IR AV DIALY SHUNT INTRO [place] W/PTA/IMG LEFT  04/20/2017   IR AV DIALY SHUNT INTRO NEEDLE/INTRACATH INITIAL W/PTA/IMG LEFT  06/24/2017   IR THROMBECTOMY AV FISTULA W/THROMBOLYSIS/PTA INC/SHUNT/IMG LEFT Left 01/04/2017   IR US GUIDE VASC ACCESS LEFT  01/04/2017   kidney tranplant  2021   left shoulder surgery  08/2005   LIGATION OF ARTERIOVENOUS  FISTULA  03/03/2016   Procedure: LIGATION OF ARTERIOVENOUS  FISTULA;  Surgeon: Elam Dutch, MD;  Location: [facility];  Service: Vascular;;   LIGATION OF ARTERIOVENOUS  FISTULA Right 09/06/2018   Procedure: Resection of pseudoaneursym and end to end repair of right brachial artery;  Surgeon: Elam Dutch, MD;  Location: [facility];  Service: Vascular;  Laterality: Right;   PERIPHERAL VASCULAR BALLOON ANGIOPLASTY  09/08/2016   Procedure: Peripheral Vascular Balloon Angioplasty;  Surgeon: Serafina Mitchell, MD;  Location: [place] [facility];  Service: Cardiovascular;;  left avf   PERIPHERAL VASCULAR BALLOON ANGIOPLASTY  09/02/2017   Procedure: PERIPHERAL VASCULAR BALLOON ANGIOPLASTY;  Surgeon: Conrad [place], MD;  Location: [place] [facility];  Service: Cardiovascular;;  left AV Graft   PERIPHERAL VASCULAR BALLOON ANGIOPLASTY  12/21/2017   Procedure: PERIPHERAL VASCULAR BALLOON ANGIOPLASTY;  Surgeon: Serafina Mitchell, MD;  Location: [facility];  Service: Cardiovascular;;  INOMINATE / UPPER ARM AV GRAFT   PERIPHERAL VASCULAR BALLOON ANGIOPLASTY Left 05/12/2018   Procedure: PERIPHERAL VASCULAR BALLOON ANGIOPLASTY;  Surgeon: Marty Heck, MD;  Location: [place] [facility];  Service: Cardiovascular;  Laterality:  Left;  Arm fistula   PERIPHERAL VASCULAR BALLOON ANGIOPLASTY Left 10/05/2018   Procedure: PERIPHERAL VASCULAR BALLOON ANGIOPLASTY;  Surgeon: Marty Heck, MD;  Location: [place] [facility];  Service: Cardiovascular;  Laterality: Left;  central and peripheral vein   PERIPHERAL VASCULAR BALLOON ANGIOPLASTY Left 04/18/2019   Procedure: PERIPHERAL VASCULAR BALLOON ANGIOPLASTY;  Surgeon: Serafina Mitchell, MD;  Location: [place] [facility];  Service: Cardiovascular;  Laterality: Left;  arm fistula    TEE WITHOUT CARDIOVERSION N/A 03/06/2016   Procedure: TRANSESOPHAGEAL ECHOCARDIOGRAM (TEE);  Surgeon: Sueanne Margarita, MD;  Location: [facility];  Service: Cardiovascular;  Laterality: N/A;   TUBAL LIGATION  11/1986    MEDICATIONS:  Accu-Chek Softclix Lancets lancets   acetaminophen (TYLENOL) 650 MG CR tablet   albuterol (PROVENTIL) (2.5 MG/3ML) 0.083% nebulizer solution   albuterol (VENTOLIN HFA) 108 (90 Base) MCG/ACT inhaler   amLODipine (NORVASC) 5 MG tablet   aspirin 81 MG EC tablet   Blood Glucose Monitoring Suppl (ACCU-CHEK GUIDE) w/Device KIT   budesonide-formoterol (SYMBICORT) 160-4.5 MCG/ACT inhaler   carvedilol (COREG) 12.5 MG tablet   cetirizine (ZYRTEC) 10 MG tablet   Cholecalciferol 25 MCG (1000 UT) capsule   cinacalcet (SENSIPAR) 30 MG tablet   Dulaglutide (TRULICITY) A999333 0000000 SOPN   fluticasone (FLONASE) 50 MCG/ACT nasal spray   glucose blood (ACCU-CHEK GUIDE) test strip   ketoconazole (NIZORAL) 2 % cream   magnesium oxide (MAG-OX) 400 MG tablet   Multiple Vitamins-Minerals (CENTRUM SILVER 50+WOMEN) TABS   mycophenolate (MYFORTIC) 180 MG EC tablet   pantoprazole (PROTONIX) 40 MG tablet   predniSONE (DELTASONE) 5 MG tablet   rosuvastatin (CRESTOR) 20 MG tablet   tacrolimus ER (ENVARSUS XR) 1 MG TB24   triamcinolone cream (KENALOG) 0.1 %   No current facility-administered medications for this encounter.      Konrad Felix Ward, PA-C [facility]  Testing [phone]

## 2022-05-25 NOTE — Anesthesia Preprocedure Evaluation (Addendum)
Anesthesia Evaluation  Patient identified by MRN, date of birth, ID band Patient awake    Reviewed: Allergy & Precautions, H&P , NPO status , Patient's Chart, lab work & pertinent test results  Airway Mallampati: II   Neck ROM: full    Dental  (+) Teeth Intact, Dental Advisory Given   Pulmonary shortness of breath, asthma , sleep apnea , pneumonia   breath sounds clear to auscultation       Cardiovascular hypertension, Pt. on medications + Peripheral Vascular Disease  + Valvular Problems/Murmurs  Rhythm:regular Rate:Normal  CV: Echo 03/25/22 1. Left ventricular ejection fraction, by estimation, is 60 to 65%. The  left ventricle has normal function. The left ventricle has no regional  wall motion abnormalities. There is moderate concentric left ventricular  hypertrophy. Left ventricular  diastolic parameters are consistent with Grade I diastolic dysfunction  (impaired relaxation).   2. Right ventricular systolic function is normal. The right ventricular  size is normal. There is normal pulmonary artery systolic pressure.   3. Left atrial size was mildly dilated.   4. Moderate pericardial effusion. The pericardial effusion is posterior  to the left ventricle and the left atrium. There is no evidence of cardiac  tamponade.   5. No evidence of mitral valve regurgitation. Moderate mitral annular  calcification.   6. Aortic valve regurgitation is mild. Aortic valve  sclerosis/calcification is present, without any evidence of aortic  stenosis.   7. The inferior vena cava is normal in size with greater than 50%  respiratory variability, suggesting right atrial pressure of 3 mmHg.     Neuro/Psych  Headaches    GI/Hepatic hiatal hernia, PUD,GERD  ,,  Endo/Other  diabetes, Type 2Hypothyroidism    Renal/GU ESRF and DialysisRenal diseasestones     Musculoskeletal  (+) Arthritis , Rheumatoid disorders,    Abdominal   Peds   Hematology  (+) Blood dyscrasia, anemia   Anesthesia Other Findings   Reproductive/Obstetrics                             Anesthesia Physical Anesthesia Plan  ASA: 3  Anesthesia Plan: MAC and Spinal   Post-op Pain Management: Minimal or no pain anticipated   Induction: Intravenous  PONV Risk Score and Plan: 2 and Ondansetron, Treatment may vary due to age or medical condition and Dexamethasone  Airway Management Planned: Natural Airway and Simple Face Mask  Additional Equipment: None  Intra-op Plan:   Post-operative Plan: Extubation in OR  Informed Consent: I have reviewed the patients History and Physical, chart, labs and discussed the procedure including the risks, benefits and alternatives for the proposed anesthesia with the patient or authorized representative who has indicated his/her understanding and acceptance.       Plan Discussed with: CRNA, Anesthesiologist and Surgeon  Anesthesia Plan Comments: (See PAT note 05/18/2022  DISCUSSION:66 y.o. never smoker with h/o HTN, asthma, DM II, sleep apnea, ESRD s/p kidney transplant, left knee medial meniscus tear scheduled for above procedure 05/29/2022 with Dr. Victorino December.    Pt last seen by cardiology 05/25/2022. Per note, "VIERA STEINLE is at low/moderate risk, from a cardiac standpoint,for her upcoming procedure- KNEE ARTHROSCOPY WITH MEDIAL MENISCAL ROOT REPAIR. She has no active symptoms at present time. It is ok to proceed without further cardiac testing."    )         Anesthesia Quick Evaluation

## 2022-05-28 ENCOUNTER — Encounter: Payer: Self-pay | Admitting: Cardiology

## 2022-05-28 DIAGNOSIS — I3139 Other pericardial effusion (noninflammatory): Secondary | ICD-10-CM | POA: Insufficient documentation

## 2022-05-28 DIAGNOSIS — R079 Chest pain, unspecified: Secondary | ICD-10-CM | POA: Insufficient documentation

## 2022-05-28 NOTE — Assessment & Plan Note (Signed)
Probably multifactorial with deconditioning being a major part of it.  This has been in the process of being evaluated for some time now.  Once she is able to Rehabilitate from her knee, we can consider CPX evaluation to evaluate for cardiac or pulmonary etiology.  This seems to be a chronic issue for her though and is not acute.  Based on echo showing no gross abnormalities besides moderate LVH with diastolic function she has no clinical symptoms of CHF.

## 2022-05-28 NOTE — Assessment & Plan Note (Signed)
[id number] is 7 on Trulicity but does not currently have insulin on board-therefore not considered to be risk factor for preop assessment.

## 2022-05-28 NOTE — Assessment & Plan Note (Signed)
She seems to be having intermittent episodes of chest discomfort that are off and on and happen rarely.  Not necessarily associate particular activity it does move down her arm but does not sound consistent with angina.  It mostly occurs at rest.  Would very unlikely to have intermittent bouts of angina at rest that would not be exacerbated by exertion.  This is in conjunction with some exertional dyspnea which is longstanding.  Echocardiogram relatively preserved function.  Would like to reassess when she is able to walk more, but the symptoms do not seem to be consistent with angina and I would not recommend further evaluation prior to her surgery.

## 2022-05-28 NOTE — Assessment & Plan Note (Signed)
Upper reviewed the echocardiogram and I do not feel that the effusion was moderate.  Is mild and not causing any physiologic members effects.  Likely related to renal disease and rheumatoid arthritis.  Not likely causing any untoward symptoms.

## 2022-05-28 NOTE — Assessment & Plan Note (Signed)
She had history of a end-stage renal disease with status post renal transplant.  Creatinine is relatively stable, but did have a slight bump back in October.  With her renal function being of major vital importance, would be reluctant to use IV contrast either for Coronary CTA or cardiac catheterization unless clearly indicated.  In the absence of chest pain or pressure with I would not recommend cardiac catheterization, therefore I would not recommend stress testing at this point.  Can reassess at follow-up when she is able to walk more.

## 2022-05-28 NOTE — Assessment & Plan Note (Addendum)
Echo shows moderate LVH was not given longstanding hypertension.  She says that she has been hypertensive since she was in her 88s.  Echo was read as grade 1 diastolic dysfunction.  Clearly some diastolic function may be contributing to her exertional dyspnea but would not necessarily explain his fatigue.  Regardless, treatment would be blood pressure control and it is adequately controlled. She is not having PND orthopnea trivial if any edema.  Would defer diuretic to her nephrologist but she does not apparently require diuretic

## 2022-05-28 NOTE — Assessment & Plan Note (Signed)
Unfortunately, not a lot of time when I saw her and her upcoming surgery.  Thankfully, she is not having active symptoms that are concerning for berry standpoint-no active exertional angina or heart failure symptoms of PND, orthopnea edema.  No unstable arrhythmias.  As such, I do not see a benefit of doing any further testing since she just had an echocardiogram done in January showing normal function and had nonischemic Myoview done just about 5 years ago.    She is barely able to walk and needs her knee meniscectomy done.  This is a relatively low risk surgery cardiac standpoint and she should be fine proceeding with surgery as is.  We can then reassess once she is able to walk to see if there is any active exertional chest pain symptoms.  It seems like her symptoms have been persistent for couple years now.  Pausing to do a stress test would not be beneficial as it would not allow her to have her surgery-therefore delaying care for her returning back to exercise.  Recommendations:  Proceed to the OR for left knee arthroscopic surgery and discectomy without further cardiac evaluation. Based on Revised Cardiac Risk Index will be low risk patient for low risk surgery. Continue amlodipine and carvedilol perioperatively along with statin for cardioprotective effect. If issues arise, cardiology will be available if necessary postop.

## 2022-05-28 NOTE — Assessment & Plan Note (Signed)
Unable to calculate LDL because of elevated triglycerides.  More likely related to obesity and diabetes as well as diet.  Will follow closely and likely consider fish oil and even possibly Vascepa

## 2022-05-28 NOTE — Assessment & Plan Note (Signed)
BP looks pretty well-controlled on amlodipine 10 mg daily, carvedilol 12.5 mg twice daily.  Because of renal concerns, not on ARB or ACE inhibitor.

## 2022-05-29 ENCOUNTER — Observation Stay (HOSPITAL_COMMUNITY)
Admission: RE | Admit: 2022-05-29 | Discharge: 2022-05-30 | Disposition: A | Discharge status: Home / Self Care | Payer: 59 | Source: Ambulatory Visit | Attending: Orthopedic Surgery | Admitting: Orthopedic Surgery

## 2022-05-29 ENCOUNTER — Other Ambulatory Visit: Payer: Self-pay

## 2022-05-29 ENCOUNTER — Ambulatory Visit (HOSPITAL_BASED_OUTPATIENT_CLINIC_OR_DEPARTMENT_OTHER): Payer: 59 | Admitting: Certified Registered Nurse Anesthetist

## 2022-05-29 ENCOUNTER — Encounter (HOSPITAL_COMMUNITY): Payer: Self-pay | Admitting: Orthopedic Surgery

## 2022-05-29 ENCOUNTER — Ambulatory Visit (HOSPITAL_COMMUNITY): Payer: 59 | Admitting: Physician Assistant

## 2022-05-29 ENCOUNTER — Encounter (HOSPITAL_COMMUNITY)
Admission: RE | Disposition: A | Discharge status: Home / Self Care | Payer: Self-pay | Source: Ambulatory Visit | Attending: Orthopedic Surgery

## 2022-05-29 DIAGNOSIS — X58XXXA Exposure to other specified factors, initial encounter: Secondary | ICD-10-CM | POA: Insufficient documentation

## 2022-05-29 DIAGNOSIS — M65162 Other infective (teno)synovitis, left knee: Secondary | ICD-10-CM | POA: Insufficient documentation

## 2022-05-29 DIAGNOSIS — G4730 Sleep apnea, unspecified: Secondary | ICD-10-CM

## 2022-05-29 DIAGNOSIS — S83242A Other tear of medial meniscus, current injury, left knee, initial encounter: Principal | ICD-10-CM | POA: Diagnosis present

## 2022-05-29 DIAGNOSIS — J45909 Unspecified asthma, uncomplicated: Secondary | ICD-10-CM | POA: Insufficient documentation

## 2022-05-29 DIAGNOSIS — Z940 Kidney transplant status: Secondary | ICD-10-CM | POA: Diagnosis not present

## 2022-05-29 DIAGNOSIS — M94262 Chondromalacia, left knee: Secondary | ICD-10-CM | POA: Diagnosis not present

## 2022-05-29 DIAGNOSIS — M2242 Chondromalacia patellae, left knee: Secondary | ICD-10-CM

## 2022-05-29 DIAGNOSIS — E039 Hypothyroidism, unspecified: Secondary | ICD-10-CM | POA: Diagnosis not present

## 2022-05-29 DIAGNOSIS — E1122 Type 2 diabetes mellitus with diabetic chronic kidney disease: Secondary | ICD-10-CM | POA: Diagnosis not present

## 2022-05-29 DIAGNOSIS — N186 End stage renal disease: Secondary | ICD-10-CM | POA: Diagnosis not present

## 2022-05-29 DIAGNOSIS — E1151 Type 2 diabetes mellitus with diabetic peripheral angiopathy without gangrene: Secondary | ICD-10-CM

## 2022-05-29 DIAGNOSIS — I120 Hypertensive chronic kidney disease with stage 5 chronic kidney disease or end stage renal disease: Secondary | ICD-10-CM | POA: Diagnosis not present

## 2022-05-29 DIAGNOSIS — Z79899 Other long term (current) drug therapy: Secondary | ICD-10-CM | POA: Diagnosis not present

## 2022-05-29 DIAGNOSIS — Z7982 Long term (current) use of aspirin: Secondary | ICD-10-CM | POA: Diagnosis not present

## 2022-05-29 DIAGNOSIS — I10 Essential (primary) hypertension: Secondary | ICD-10-CM

## 2022-05-29 DIAGNOSIS — M659 Synovitis and tenosynovitis, unspecified: Secondary | ICD-10-CM | POA: Diagnosis not present

## 2022-05-29 HISTORY — PX: KNEE ARTHROSCOPY WITH MENISCAL REPAIR: SHX5653

## 2022-05-29 LAB — GLUCOSE, CAPILLARY
Glucose-Capillary: 149 mg/dL — ABNORMAL HIGH (ref 70–99)
Glucose-Capillary: 191 mg/dL — ABNORMAL HIGH (ref 70–99)
Glucose-Capillary: 195 mg/dL — ABNORMAL HIGH (ref 70–99)
Glucose-Capillary: 276 mg/dL — ABNORMAL HIGH (ref 70–99)
Glucose-Capillary: 240 mg/dL — ABNORMAL HIGH (ref 70–99)
Glucose-Capillary: 188 mg/dL — ABNORMAL HIGH (ref 70–99)

## 2022-05-29 SURGERY — ARTHROSCOPY, KNEE, WITH MENISCUS REPAIR
Anesthesia: General | Site: Knee | Laterality: Left

## 2022-05-29 MED ORDER — ACETAMINOPHEN 500 MG PO TABS
500.0000 mg | ORAL_TABLET | Freq: Four times a day (QID) | ORAL | Status: AC
  Administered 2022-05-29 – 2022-05-30 (×3): 500 mg via ORAL
  Filled 2022-05-29 (×3): qty 1

## 2022-05-29 MED ORDER — METOCLOPRAMIDE HCL 5 MG/ML IJ SOLN: 5.0000 mg | Freq: Three times a day (TID) | INTRAMUSCULAR | Status: DC | PRN

## 2022-05-29 MED ORDER — BUPIVACAINE HCL (PF) 0.25 % IJ SOLN
INTRAMUSCULAR | Status: AC
  Filled 2022-05-29: qty 30

## 2022-05-29 MED ORDER — OXYCODONE HCL 5 MG PO TABS
5.0000 mg | ORAL_TABLET | Freq: Once | ORAL | Status: AC | PRN
  Administered 2022-05-29: 5 mg via ORAL

## 2022-05-29 MED ORDER — LACTATED RINGERS IV SOLN: INTRAVENOUS | Status: DC

## 2022-05-29 MED ORDER — MORPHINE SULFATE (PF) 2 MG/ML IV SOLN: 0.5000 mg | INTRAVENOUS | Status: DC | PRN

## 2022-05-29 MED ORDER — ORAL CARE MOUTH RINSE: 15.0000 mL | Freq: Once | OROMUCOSAL | Status: AC

## 2022-05-29 MED ORDER — ROSUVASTATIN CALCIUM 20 MG PO TABS
20.0000 mg | ORAL_TABLET | Freq: Every day | ORAL | Status: DC
  Administered 2022-05-30: 20 mg via ORAL
  Filled 2022-05-29: qty 1

## 2022-05-29 MED ORDER — SODIUM CHLORIDE 0.9 % IR SOLN
Status: DC | PRN
  Administered 2022-05-29: 6000 mL

## 2022-05-29 MED ORDER — MYCOPHENOLATE SODIUM 180 MG PO TBEC
360.0000 mg | DELAYED_RELEASE_TABLET | Freq: Two times a day (BID) | ORAL | Status: DC
  Administered 2022-05-29 – 2022-05-30 (×2): 360 mg via ORAL
  Filled 2022-05-29 (×2): qty 2

## 2022-05-29 MED ORDER — MEPERIDINE HCL 50 MG/ML IJ SOLN: 6.2500 mg | INTRAMUSCULAR | Status: DC | PRN

## 2022-05-29 MED ORDER — PREDNISONE 5 MG PO TABS
5.0000 mg | ORAL_TABLET | Freq: Every day | ORAL | Status: DC
  Administered 2022-05-30: 5 mg via ORAL
  Filled 2022-05-29: qty 1

## 2022-05-29 MED ORDER — MIDAZOLAM HCL 2 MG/2ML IJ SOLN
INTRAMUSCULAR | Status: AC
  Filled 2022-05-29: qty 2

## 2022-05-29 MED ORDER — ONDANSETRON HCL 4 MG PO TABS: 4.0000 mg | ORAL_TABLET | Freq: Four times a day (QID) | ORAL | Status: DC | PRN

## 2022-05-29 MED ORDER — PROPOFOL 500 MG/50ML IV EMUL
INTRAVENOUS | Status: AC
  Filled 2022-05-29: qty 50

## 2022-05-29 MED ORDER — PHENYLEPHRINE 80 MCG/ML (10ML) SYRINGE FOR IV PUSH (FOR BLOOD PRESSURE SUPPORT)
PREFILLED_SYRINGE | INTRAVENOUS | Status: DC | PRN
  Administered 2022-05-29: 80 ug via INTRAVENOUS
  Administered 2022-05-29: 40 ug via INTRAVENOUS
  Administered 2022-05-29: 160 ug via INTRAVENOUS

## 2022-05-29 MED ORDER — DEXAMETHASONE SODIUM PHOSPHATE 10 MG/ML IJ SOLN
INTRAMUSCULAR | Status: DC | PRN
  Administered 2022-05-29: 4 mg via INTRAVENOUS

## 2022-05-29 MED ORDER — ACETAMINOPHEN 325 MG PO TABS: 325.0000 mg | ORAL_TABLET | Freq: Four times a day (QID) | ORAL | Status: DC | PRN

## 2022-05-29 MED ORDER — VANCOMYCIN HCL IN DEXTROSE 1-5 GM/200ML-% IV SOLN
1000.0000 mg | INTRAVENOUS | Status: AC
  Administered 2022-05-29 (×2): 1000 mg via INTRAVENOUS
  Filled 2022-05-29: qty 200

## 2022-05-29 MED ORDER — BUPIVACAINE HCL 0.25 % IJ SOLN
INTRAMUSCULAR | Status: DC | PRN
  Administered 2022-05-29: 30 mL

## 2022-05-29 MED ORDER — CINACALCET HCL 30 MG PO TABS
30.0000 mg | ORAL_TABLET | Freq: Every day | ORAL | Status: DC
  Administered 2022-05-29 – 2022-05-30 (×2): 30 mg via ORAL
  Filled 2022-05-29 (×2): qty 1

## 2022-05-29 MED ORDER — FENTANYL CITRATE PF 50 MCG/ML IJ SOSY
25.0000 ug | PREFILLED_SYRINGE | INTRAMUSCULAR | Status: DC | PRN
  Administered 2022-05-29: 50 ug via INTRAVENOUS

## 2022-05-29 MED ORDER — INSULIN ASPART 100 UNIT/ML IJ SOLN
0.0000 [IU] | Freq: Three times a day (TID) | INTRAMUSCULAR | Status: DC
  Administered 2022-05-29: 5 [IU] via SUBCUTANEOUS
  Administered 2022-05-29: 3 [IU] via SUBCUTANEOUS
  Administered 2022-05-30: 2 [IU] via SUBCUTANEOUS

## 2022-05-29 MED ORDER — FENTANYL CITRATE (PF) 100 MCG/2ML IJ SOLN
INTRAMUSCULAR | Status: DC | PRN
  Administered 2022-05-29 (×8): 25 ug via INTRAVENOUS

## 2022-05-29 MED ORDER — FENTANYL CITRATE (PF) 100 MCG/2ML IJ SOLN
INTRAMUSCULAR | Status: AC
  Filled 2022-05-29: qty 2

## 2022-05-29 MED ORDER — FENTANYL CITRATE PF 50 MCG/ML IJ SOSY
PREFILLED_SYRINGE | INTRAMUSCULAR | Status: AC
  Filled 2022-05-29: qty 1

## 2022-05-29 MED ORDER — EPINEPHRINE PF 1 MG/ML IJ SOLN
INTRAMUSCULAR | Status: DC | PRN
  Administered 2022-05-29: 1 mg

## 2022-05-29 MED ORDER — ONDANSETRON HCL 4 MG/2ML IJ SOLN
INTRAMUSCULAR | Status: DC | PRN
  Administered 2022-05-29: 4 mg via INTRAVENOUS

## 2022-05-29 MED ORDER — LORATADINE 10 MG PO TABS
10.0000 mg | ORAL_TABLET | Freq: Every day | ORAL | Status: DC
  Administered 2022-05-30: 10 mg via ORAL
  Filled 2022-05-29: qty 1

## 2022-05-29 MED ORDER — ONDANSETRON HCL 4 MG PO TABS: 4.0000 mg | ORAL_TABLET | Freq: Three times a day (TID) | ORAL | 0 refills | Status: AC | PRN

## 2022-05-29 MED ORDER — ONDANSETRON HCL 4 MG/2ML IJ SOLN: 4.0000 mg | Freq: Once | INTRAMUSCULAR | Status: DC | PRN

## 2022-05-29 MED ORDER — ALBUTEROL SULFATE (2.5 MG/3ML) 0.083% IN NEBU: 2.5000 mg | INHALATION_SOLUTION | Freq: Four times a day (QID) | RESPIRATORY_TRACT | Status: DC | PRN

## 2022-05-29 MED ORDER — ASPIRIN 81 MG PO TBEC
81.0000 mg | DELAYED_RELEASE_TABLET | Freq: Every day | ORAL | Status: DC
  Administered 2022-05-30: 81 mg via ORAL
  Filled 2022-05-29: qty 1

## 2022-05-29 MED ORDER — MAGNESIUM OXIDE -MG SUPPLEMENT 400 (240 MG) MG PO TABS
400.0000 mg | ORAL_TABLET | Freq: Every day | ORAL | Status: DC
  Administered 2022-05-29 – 2022-05-30 (×2): 400 mg via ORAL
  Filled 2022-05-29 (×2): qty 1

## 2022-05-29 MED ORDER — PROPOFOL 10 MG/ML IV BOLUS
INTRAVENOUS | Status: DC | PRN
  Administered 2022-05-29: 40 mg via INTRAVENOUS
  Administered 2022-05-29: 160 mg via INTRAVENOUS

## 2022-05-29 MED ORDER — TACROLIMUS ER 4 MG PO TB24
9.0000 mg | ORAL_TABLET | Freq: Every day | ORAL | Status: DC
  Administered 2022-05-30: 9 mg via ORAL
  Filled 2022-05-29: qty 1

## 2022-05-29 MED ORDER — DOCUSATE SODIUM 100 MG PO CAPS
100.0000 mg | ORAL_CAPSULE | Freq: Two times a day (BID) | ORAL | Status: DC
  Administered 2022-05-29 – 2022-05-30 (×2): 100 mg via ORAL
  Filled 2022-05-29 (×2): qty 1

## 2022-05-29 MED ORDER — OXYCODONE HCL 5 MG PO TABS
ORAL_TABLET | ORAL | Status: AC
  Filled 2022-05-29: qty 1

## 2022-05-29 MED ORDER — OXYCODONE HCL 5 MG PO TABS: 5.0000 mg | ORAL_TABLET | Freq: Four times a day (QID) | ORAL | 0 refills | Status: AC | PRN

## 2022-05-29 MED ORDER — HYDROCODONE-ACETAMINOPHEN 5-325 MG PO TABS
1.0000 | ORAL_TABLET | ORAL | Status: DC | PRN
  Administered 2022-05-29 – 2022-05-30 (×2): 1 via ORAL
  Filled 2022-05-29 (×2): qty 1

## 2022-05-29 MED ORDER — AMLODIPINE BESYLATE 5 MG PO TABS
5.0000 mg | ORAL_TABLET | Freq: Every day | ORAL | Status: DC
  Administered 2022-05-29: 5 mg via ORAL
  Filled 2022-05-29: qty 1

## 2022-05-29 MED ORDER — ALBUTEROL SULFATE HFA 108 (90 BASE) MCG/ACT IN AERS: 1.0000 | INHALATION_SPRAY | RESPIRATORY_TRACT | Status: DC | PRN

## 2022-05-29 MED ORDER — HYDROCODONE-ACETAMINOPHEN 7.5-325 MG PO TABS: 1.0000 | ORAL_TABLET | ORAL | Status: DC | PRN

## 2022-05-29 MED ORDER — LIDOCAINE HCL (PF) 2 % IJ SOLN
INTRAMUSCULAR | Status: AC
  Filled 2022-05-29: qty 5

## 2022-05-29 MED ORDER — CHLORHEXIDINE GLUCONATE 0.12 % MT SOLN
15.0000 mL | Freq: Once | OROMUCOSAL | Status: AC
  Administered 2022-05-29: 15 mL via OROMUCOSAL

## 2022-05-29 MED ORDER — CARVEDILOL 12.5 MG PO TABS
12.5000 mg | ORAL_TABLET | Freq: Two times a day (BID) | ORAL | Status: DC
  Administered 2022-05-29 – 2022-05-30 (×2): 12.5 mg via ORAL
  Filled 2022-05-29 (×2): qty 1

## 2022-05-29 MED ORDER — ACETAMINOPHEN 325 MG PO TABS: 325.0000 mg | ORAL_TABLET | ORAL | Status: DC | PRN

## 2022-05-29 MED ORDER — ONDANSETRON HCL 4 MG/2ML IJ SOLN: 4.0000 mg | Freq: Four times a day (QID) | INTRAMUSCULAR | Status: DC | PRN

## 2022-05-29 MED ORDER — ACETAMINOPHEN 160 MG/5ML PO SOLN: 325.0000 mg | ORAL | Status: DC | PRN

## 2022-05-29 MED ORDER — EPINEPHRINE PF 1 MG/ML IJ SOLN
INTRAMUSCULAR | Status: AC
  Filled 2022-05-29: qty 2

## 2022-05-29 MED ORDER — PANTOPRAZOLE SODIUM 40 MG PO TBEC
40.0000 mg | DELAYED_RELEASE_TABLET | Freq: Every day | ORAL | Status: DC
  Administered 2022-05-29 – 2022-05-30 (×2): 40 mg via ORAL
  Filled 2022-05-29 (×2): qty 1

## 2022-05-29 MED ORDER — METOCLOPRAMIDE HCL 5 MG PO TABS: 5.0000 mg | ORAL_TABLET | Freq: Three times a day (TID) | ORAL | Status: DC | PRN

## 2022-05-29 MED ORDER — INSULIN ASPART 100 UNIT/ML IJ SOLN: 0.0000 [IU] | Freq: Every day | INTRAMUSCULAR | Status: DC

## 2022-05-29 MED ORDER — OXYCODONE HCL 5 MG/5ML PO SOLN: 5.0000 mg | Freq: Once | ORAL | Status: AC | PRN

## 2022-05-29 MED ORDER — DEXAMETHASONE SODIUM PHOSPHATE 10 MG/ML IJ SOLN
INTRAMUSCULAR | Status: AC
  Filled 2022-05-29: qty 1

## 2022-05-29 MED ORDER — FLUTICASONE PROPIONATE 50 MCG/ACT NA SUSP
1.0000 | Freq: Every day | NASAL | Status: DC
  Administered 2022-05-29 – 2022-05-30 (×2): 1 via NASAL
  Filled 2022-05-29: qty 16

## 2022-05-29 MED ORDER — ONDANSETRON HCL 4 MG/2ML IJ SOLN
INTRAMUSCULAR | Status: AC
  Filled 2022-05-29: qty 2

## 2022-05-29 SURGICAL SUPPLY — 38 items
BANDAGE ESMARK 6X9 LF (GAUZE/BANDAGES/DRESSINGS) IMPLANT
BLADE SHAVER TORPEDO 4X13 (MISCELLANEOUS) IMPLANT
BNDG CMPR 5X62 HK CLSR LF (GAUZE/BANDAGES/DRESSINGS) ×1
BNDG CMPR 9X6 STRL LF SNTH (GAUZE/BANDAGES/DRESSINGS)
BNDG ELASTIC 6INX 5YD STR LF (GAUZE/BANDAGES/DRESSINGS) ×1 IMPLANT
BNDG ELASTIC 6X5.8 VLCR STR LF (GAUZE/BANDAGES/DRESSINGS) IMPLANT
BNDG ESMARK 6X9 LF (GAUZE/BANDAGES/DRESSINGS)
CLSR STERI-STRIP ANTIMIC 1/2X4 (GAUZE/BANDAGES/DRESSINGS) IMPLANT
CUFF TOURN SGL QUICK 34 (TOURNIQUET CUFF) ×1
CUFF TRNQT CYL 34X4.125X (TOURNIQUET CUFF) IMPLANT
DRAPE ARTHROSCOPY W/POUCH 114 (DRAPES) ×1 IMPLANT
DRAPE U-SHAPE 47X51 STRL (DRAPES) ×1 IMPLANT
DURAPREP 26ML APPLICATOR (WOUND CARE) ×1 IMPLANT
GAUZE 4X4 16PLY ~~LOC~~+RFID DBL (SPONGE) ×1 IMPLANT
GAUZE PAD ABD 8X10 STRL (GAUZE/BANDAGES/DRESSINGS) ×1 IMPLANT
GAUZE SPONGE 4X4 12PLY STRL (GAUZE/BANDAGES/DRESSINGS) ×1 IMPLANT
GAUZE XEROFORM 1X8 LF (GAUZE/BANDAGES/DRESSINGS) IMPLANT
GLOVE BIO SURGEON STRL SZ7.5 (GLOVE) ×2 IMPLANT
GLOVE BIOGEL PI IND STRL 8 (GLOVE) ×2 IMPLANT
GOWN STRL REUS W/ TWL XL LVL3 (GOWN DISPOSABLE) ×2 IMPLANT
GOWN STRL REUS W/TWL XL LVL3 (GOWN DISPOSABLE) ×2
IV NS IRRIG 3000ML ARTHROMATIC (IV SOLUTION) ×2 IMPLANT
KIT BASIN OR (CUSTOM PROCEDURE TRAY) ×1 IMPLANT
KIT SUTLOC MENISCAL ROOT REP (Anchor) IMPLANT
KIT TURNOVER KIT A (KITS) ×1 IMPLANT
MANIFOLD NEPTUNE II (INSTRUMENTS) ×1 IMPLANT
PACK ARTHROSCOPY DSU (CUSTOM PROCEDURE TRAY) ×1 IMPLANT
PAD ARMBOARD 7.5X6 YLW CONV (MISCELLANEOUS) IMPLANT
PADDING CAST COTTON 6X4 STRL (CAST SUPPLIES) IMPLANT
PORT APPOLLO RF 90DEGREE MULTI (SURGICAL WAND) IMPLANT
STRIP CLOSURE SKIN 1/2X4 (GAUZE/BANDAGES/DRESSINGS) IMPLANT
SUT ETHILON 3 0 PS 1 (SUTURE) IMPLANT
SUT MNCRL AB 3-0 PS2 27 (SUTURE) IMPLANT
TOWEL OR 17X26 10 PK STRL BLUE (TOWEL DISPOSABLE) ×1 IMPLANT
TUBING ARTHROSCOPY IRRIG 16FT (MISCELLANEOUS) ×1 IMPLANT
TUBING CONNECTING 10 (TUBING) ×1 IMPLANT
WAND APOLLORF SJ50 AR-9845 (SURGICAL WAND) IMPLANT
WATER STERILE IRR 500ML POUR (IV SOLUTION) ×1 IMPLANT

## 2022-05-29 NOTE — Evaluation (Signed)
Physical Therapy Evaluation Patient Details Name: Rachael George MRN: [id number] DOB: 1955/06/18 Today's Date: 05/29/2022  History of Present Illness  67 yo  female presents to therapy s/p L knee arthroscopy on 05/29/2022 with limited synovectomy, medial meniscus root repair and chondroplasty of medial femoral condyle.secondary to L knee pain and edema associate with acute tear of posterior aspect of medial meniscus.  Pt is currently LLE PWB (25%). 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  Clinical Impression      Rachael George is a 67 y.o. female POD 0 s/p L knee arthroscopy, meniscus and medial root repair. Patient reports Mod I  with mobility at baseline. Patient is now limited by functional impairments (see PT problem list below) and requires S for bed mobility and min guard for transfers. Patient was able to ambulate 18 and 14 feet with RW and min guard level of assist with cues and instruction on LLE PWB. Patient instructed in exercise to facilitate ROM and circulation to manage edema. Patient will benefit from continued skilled PT interventions to address impairments and progress towards PLOF. Acute PT will follow to progress mobility and stair training in preparation for safe discharge home.    Recommendations for follow up therapy are one component of a multi-disciplinary discharge planning process, led by the attending physician.  Recommendations may be updated based on patient status, additional functional criteria and insurance authorization.  Follow Up Recommendations Follow physician's recommendations for discharge plan and follow up therapies      Assistance Recommended at Discharge Intermittent Supervision/Assistance  Patient can return home with the following  A little help with walking and/or transfers;A little help with bathing/dressing/bathroom;Assistance with cooking/housework;Assist for  transportation;Help with stairs or ramp for entrance    Equipment Recommendations Rolling walker (2 wheels) (pt states rollator will not fit though bathroom door. PT recommends RW for safety, stability and LLE WB status)  Recommendations for Other Services       Functional Status Assessment Patient has had a recent decline in their functional status and demonstrates the ability to make significant improvements in function in a reasonable and predictable amount of time.     Precautions / Restrictions Precautions Precautions: Fall Precaution Comments: LLE PWB Restrictions Weight Bearing Restrictions: Yes LLE Weight Bearing: Partial weight bearing LLE Partial Weight Bearing Percentage or Pounds: 25      Mobility  Bed Mobility Overal bed mobility: Needs Assistance Bed Mobility: Supine to Sit     Supine to sit: Supervision     General bed mobility comments: min cues increased time and HOB elevated    Transfers Overall transfer level: Needs assistance   Transfers: Sit to/from Stand Sit to Stand: Min guard           General transfer comment: cues to maintain LLE PWB, placement of RW, UE and LLE    Ambulation/Gait Ambulation/Gait assistance: Min guard Gait Distance (Feet): 18 Feet Assistive device: Rolling walker (2 wheels) Gait Pattern/deviations: Step-to pattern, Knee flexed in stance - left, Decreased stance time - left, Trunk flexed (pt encouraged to WB though B UEs to offload LLE and maintain PF with gait tasks at this time.) Gait velocity: decreased     General Gait Details: difficulty observing LLE PWB status  Stairs            Wheelchair Mobility    Modified Rankin (Stroke Patients Only)  Balance Overall balance assessment: Needs assistance Sitting-balance support: No upper extremity supported, Feet supported Sitting balance-Leahy Scale: Fair     Standing balance support: Reliant on assistive device for balance Standing balance-Leahy  Scale: Poor                               Pertinent Vitals/Pain Pain Assessment Pain Assessment: 0-10 Pain Score: 0-No pain Pain Location: pt reports no pain in L knee t/o intervenion with the exception of a pulling sensation with SLR Pain Intervention(s): Monitored during session, Limited activity within patient's tolerance, Premedicated before session, Ice applied    Home Living Family/patient expects to be discharged to:: Private residence Living Arrangements: Other relatives (grandchildren live with pt and able to assist) Available Help at Discharge: Family;Available 24 hours/day Type of Home: Mobile home Home Access: Stairs to enter Entrance Stairs-Rails: Can reach both Entrance Stairs-Number of Steps: 5 (4 B hand rail, 5 deck banister for support on L side)   Home Layout: One level Home Equipment: Rollator (4 wheels);Shower seat;BSC/3in1;Wheelchair - manual      Prior Function Prior Level of Function : Independent/Modified Independent             Mobility Comments: use of Rollator since 12/2021, mod I with all ADLs, self care tasks, IADLs, and driving       Hand Dominance   Dominant Hand: Left    Extremity/Trunk Assessment        Lower Extremity Assessment Lower Extremity Assessment: LLE deficits/detail LLE Deficits / Details: ankle DF/PF 5/5, SLR inially with AA LLE Sensation: WNL       Communication   Communication: No difficulties  Cognition Arousal/Alertness: Awake/alert Behavior During Therapy: WFL for tasks assessed/performed Overall Cognitive Status: Within Functional Limits for tasks assessed                                          General Comments General comments (skin integrity, edema, etc.): pt c/o pain with R WB on RW at IV site (Pt and nurse report pt amb with staff in PACU with RW and unaware of LLE WB restricitons at that time.)    Exercises Total Joint Exercises Ankle Circles/Pumps: AROM, Both, 20  reps Heel Slides: AROM, Left, 5 reps Hip ABduction/ADduction: AROM, Left, 5 reps Straight Leg Raises: AROM, Left, 5 reps   Assessment/Plan    PT Assessment Patient needs continued PT services  PT Problem List Decreased strength;Decreased activity tolerance;Decreased range of motion;Decreased balance;Decreased mobility;Decreased coordination;Pain       PT Treatment Interventions DME instruction;Gait training;Stair training;Functional mobility training;Therapeutic activities;Therapeutic exercise;Balance training;Neuromuscular re-education;Patient/family education;Modalities    PT Goals (Current goals can be found in the Care Plan section)  Acute Rehab PT Goals Patient Stated Goal: no longer be in pain and be able to move better PT Goal Formulation: With patient Time For Goal Achievement: 06/12/22 Potential to Achieve Goals: Good    Frequency 7X/week     Co-evaluation               AM-PAC PT "6 Clicks" Mobility  Outcome Measure Help needed turning from your back to your side while in a flat bed without using bedrails?: None Help needed moving from lying on your back to sitting on the side of a flat bed without using bedrails?: A Little Help needed moving to and  from a bed to a chair (including a wheelchair)?: A Little Help needed standing up from a chair using your arms (e.g., wheelchair or bedside chair)?: A Little Help needed to walk in hospital room?: A Little Help needed climbing 3-5 steps with a railing? : Total 6 Click Score: 17    End of Session Equipment Utilized During Treatment: Gait belt Activity Tolerance: Patient tolerated treatment well;No increased pain;Other (comment) (no change in L knee pain report, pain report for R UE at IV site) Patient left: in chair;with call bell/phone within reach;with chair alarm set Nurse Communication: Mobility status;Weight bearing status;Other (comment) (discomfort at IV site) PT Visit Diagnosis: Unsteadiness on feet  (R26.81);Other abnormalities of gait and mobility (R26.89);Muscle weakness (generalized) (M62.81);Difficulty in walking, not elsewhere classified (R26.2);Pain    Time: [id number] PT Time Calculation (min) (ACUTE ONLY): 45 min   Charges:   PT Evaluation $PT Eval Low Complexity: 1 Low PT Treatments $Gait Training: 8-22 mins $Therapeutic Activity: 8-22 mins       Baird Lyons, PT   Adair Patter 05/29/2022, 2:40 PM

## 2022-05-29 NOTE — Discharge Instructions (Signed)
Post-operative patient instructions  Knee Arthroscopy with Meniscus Repair  Ice:  Place intermittent ice or cooler pack over your knee, 30 minutes on and 30 minutes off.  Continue this for the first 72 hours after surgery, then save ice for use after therapy sessions or on more active days.   Weight: Partial weightbearing to the operative extremity up to 25% x 4 weeks. DVT prevention: Perform ankle pumps as able throughout the day on the operative extremity.    You should also take an 81 mg aspirin once per day x6 weeks. Crutches:  Use crutches (or walker) to assist in walking  Strengthening:  Perform simple thigh squeezes (isometric quad contractions) and straight leg lifts as you are able (3 sets of 5 to 10 repetitions, 3 times a day).  For the leg lifts, have someone support under your ankle in the beginning until you have increased strength enough to do this on your own.  To help get started on thigh squeezes, place a pillow under your knee and push down on the pillow with back of knee (sometimes easier to do than with your leg fully straight). Motion: Okay for ankle range of motion as tolerated.  On the operative knee we will limit bending to 90 degrees.  For the first 2 weeks she will remain in your knee immobilizer with the leg straight. Dressing:  Perform 1st dressing change at 3 days postoperative. A moderate amount of blood tinged drainage is to be expected.  So if you bleed through the dressing on the first or second day or if you have fevers, it is fine to change the dressing/check the wounds early and redress wound. Elevate your leg.  If it bleeds through again, or if the incisions are leaking frank blood, please call the office.  Shower:   shower is ok after 3 days.  Please take shower, NO bath. Recover with gauze and ace wrap to help keep wounds protected.   Pain medication:  A narcotic pain medication has been prescribed.  Take as directed.  Typically you need narcotic pain medication more  regularly during the first 3 to 5 days after surgery.  Decrease your use of the medication as the pain improves.  Narcotics can sometimes cause constipation, even after a few doses.  If you have problems with constipation, you can take an over the counter stool softener or light laxative.  If you have persistent problems, please notify your physician's office. Physical therapy: Additional activity guidelines to be provided by your physician or physical therapist at follow-up visits.  Driving: Do not recommend driving x 1-2 weeks post surgical, especially if surgery performed on right side. Should not drive while taking narcotic pain medications. It typically takes at least 2 weeks to restore sufficient neuromuscular function for normal reaction times for driving safety.  Call [phone] for questions or problems. Evenings you will be forwarded to the hospital operator.  Ask for the orthopaedic physician on call. Please call if you experience:    Redness, foul smelling, or persistent drainage from the surgical site  worsening knee pain and swelling not responsive to medication  any calf pain and or swelling of the lower leg  temperatures greater than 101.5 F other questions or concerns

## 2022-05-29 NOTE — Anesthesia Postprocedure Evaluation (Signed)
Anesthesia Post Note  Patient: HEIDI WHITSON  Procedure(s) Performed: KNEE ARTHROSCOPY WITH MEDIAL MENISCAL ROOT REPAIR (Left: Knee)     Patient location during evaluation: PACU Anesthesia Type: General Level of consciousness: awake and alert Pain management: pain level controlled Vital Signs Assessment: post-procedure vital signs reviewed and stable Respiratory status: spontaneous breathing, nonlabored ventilation, respiratory function stable and patient connected to nasal cannula oxygen Cardiovascular status: blood pressure returned to baseline and stable Postop Assessment: no apparent nausea or vomiting Anesthetic complications: no   No notable events documented.  Last Vitals:  Vitals:   05/29/22 1000 05/29/22 1027  BP: (!) 152/90 (!) 172/91  Pulse: 89 88  Resp: 17 18  Temp: (!) 36.4 C 36.5 C  SpO2: 97% 99%    Last Pain:  Vitals:   05/29/22 1027  TempSrc: Oral  PainSc:                  Cono Gebhard

## 2022-05-29 NOTE — Anesthesia Procedure Notes (Signed)
Procedure Name: LMA Insertion Date/Time: 05/29/2022 7:28 AM  Performed by: Genelle Bal, CRNAPre-anesthesia Checklist: Patient identified, Emergency Drugs available, Suction available and Patient being monitored Patient Re-evaluated:Patient Re-evaluated prior to induction Oxygen Delivery Method: Circle system utilized Preoxygenation: Pre-oxygenation with 100% oxygen Induction Type: IV induction Ventilation: Mask ventilation without difficulty LMA: LMA inserted LMA Size: 4.0 Number of attempts: 1 Airway Equipment and Method: Bite block Placement Confirmation: positive ETCO2 Tube secured with: Tape Dental Injury: Teeth and Oropharynx as per pre-operative assessment

## 2022-05-29 NOTE — Transfer of Care (Signed)
Immediate Anesthesia Transfer of Care Note  Patient: Rachael George  Procedure(s) Performed: KNEE ARTHROSCOPY WITH MEDIAL MENISCAL ROOT REPAIR (Left: Knee)  Patient Location: PACU  Anesthesia Type:General  Level of Consciousness: awake, alert , and oriented  Airway & Oxygen Therapy: Patient Spontanous Breathing and Patient connected to face mask oxygen  Post-op Assessment: Report given to RN and Post -op Vital signs reviewed and stable  Post vital signs: Reviewed and stable  Last Vitals:  Vitals Value Taken Time  BP 170/93 05/29/22 0840  Temp    Pulse 81 05/29/22 0842  Resp 15 05/29/22 0842  SpO2 100 % 05/29/22 0842  Vitals shown include unvalidated device data.  Last Pain:  Vitals:   05/29/22 0633  TempSrc:   PainSc: 4          Complications: No notable events documented.

## 2022-05-29 NOTE — Brief Op Note (Signed)
05/29/2022  8:39 AM  PATIENT:  Rachael George  67 y.o. female  PRE-OPERATIVE DIAGNOSIS:  Left knee medial meniscus root tear  POST-OPERATIVE DIAGNOSIS:  Left knee medial meniscus root tear  PROCEDURE:  Procedure(s) with comments: KNEE ARTHROSCOPY WITH MEDIAL MENISCAL ROOT REPAIR (Left) - 75  SURGEON:  Surgeon(s) and Role:    * Stann Mainland, Elly Modena, MD - Primary   ASSISTANTS: Sherlean Foot, RNFA   ANESTHESIA:   local and general  EBL:  5 mL   BLOOD ADMINISTERED:none  DRAINS: none   LOCAL MEDICATIONS USED:  MARCAINE     SPECIMEN:  No Specimen  DISPOSITION OF SPECIMEN:  N/A  COUNTS:  YES  TOURNIQUET:   Total Tourniquet Time Documented: Thigh (Left) - 34 minutes Total: Thigh (Left) - 34 minutes   DICTATION: .Note written in EPIC  PLAN OF CARE: Admit for overnight observation  PATIENT DISPOSITION:  PACU - hemodynamically stable.   Delay start of Pharmacological VTE agent (>24hrs) due to surgical blood loss or risk of bleeding: not applicable

## 2022-05-29 NOTE — Op Note (Signed)
Surgery Date: 05/29/2022  Surgeon(s): Nicholes Stairs, MD  Assistant: Wade Hampton Bing, RNFA  ANESTHESIA:  general, and local  FLUIDS: Per anesthesia record.   ESTIMATED BLOOD LOSS: minimal  PREOPERATIVE DIAGNOSES:  1.  Left knee medial root meniscus tear 2.  Left knee synovitis 3.  Left knee chondromalacia medial femoral condyle  POSTOPERATIVE DIAGNOSES:  same  PROCEDURES PERFORMED:  1.  Left knee arthroscopy with limited synovectomy 2.  Left knee arthroscopy with medial meniscus root repair 3.  Left knee arthroscopic chondroplasty medial femoral condyle   Operative implants:  Arthrex meniscal root all suture implant  DESCRIPTION OF PROCEDURE: Rachael George is a 67 year old female with left knee acute radial tear of the medial meniscus tear. Plans are to proceed with partial meniscectomy versus root repair and diagnostic arthroscopy with debridement as indicated. Full discussion held regarding risks benefits alternatives and complications related surgical intervention. Conservative care options reviewed. All questions answered.   The patient was identified in the preoperative holding area and the operative extremity was marked. The patient was brought to the operating room and transferred to operating table in a supine position. Satisfactory general anesthesia was induced by anesthesiology.     Standard anterolateral, anteromedial arthroscopy portals were obtained. The anteromedial portal was obtained with a spinal needle for localization under direct visualization with subsequent diagnostic findings.    Anteromedial and anterolateral chambers: mild synovitis. The synovitis was debrided with a 4.5 mm full radius shaver through both the anteromedial and lateral portals.    Suprapatellar pouch and gutters: mild synovitis or debris. Patella chondral surface: Grade 2 Trochlear chondral surface: Grade 0 Patellofemoral tracking: Midline and level Medial meniscus: Radial tear of  the posterior horn of the medial meniscus about a centimeter and a half medial to the true root but, no root stump and no viable tissue interposed between the site of the radial tear and the anatomic root.  The posterior horn was completely free-floating..  Medial femoral condyle flexion bearing surface: Grade 1 Medial femoral condyle extension bearing surface: Grade 2 Medial tibial plateau: Grade 2 Anterior cruciate ligament:stable Posterior cruciate ligament:stable Lateral meniscus: Intact without tear.   Lateral femoral condyle flexion bearing surface: Grade 0 Lateral femoral condyle extension bearing surface: Grade 0 Lateral tibial plateau: Grade 1   We next proceeded with chondroplasty of the posterior surface of the patella with a motorized shaver to stabilize any unstable fragments.  Likewise, in the medial compartment on the medial tibial plateau we performed chondroplasty to stabilize the grade II chondromalacia.   We next moved our attention to the medial meniscus repair.  This was a clear radial tear of the posterior horn.  This was directly overlying the anatomic root.  However, no viable tissue interposed.  Therefore we elected to repair the meniscus at its current location with a standard transtibial root repair technique.  Once we had adequate visualization we inserted our point-to-point drill guide for the Arthrex suture lock meniscal root repair device.  We first drilled a drill pin that was cannulated.  We passed a nitinol wire through this drill pin.  We retrieved this loop through the medial portal.  We then antegrade passed the Arthrex suture lock repair anchor.  This was seated onto the cortical bone of the medial tibia.  We then placed a passport cannula through the medial portal.  We worked through this cannula throughout the case.  We passed to repair sutures through the meniscal tissue that was healthy.  We converted this with  a fiber link through the suture anchor to cinch the  torn meniscus tissue back down to the bone.  This had excellent fixation.  Both sutures had good fixation.  Suture limbs were cut.       After completion of synovectomy, meniscus repair, diagnostic exam, and debridements as described, all compartments were checked and no residual debris remained. Hemostasis was achieved with the cautery wand. The portals were approximated with buried monocryl. All excess fluid was expressed from the joint.  Xeroform sterile gauze dressings were applied followed by Ace bandage and ice pack.  No noted intraoperative complications.  The patient was extubated and transported to PACU in stable condition.   DISPOSITION: Rachael George will be partial weightbearing up to 25% for 4 weeks to the operative leg.  She can begin outpatient therapy working on range of motion up to 90 degrees for the first 4 weeks.  She will be on 81 mg aspirin once per day x 6 weeks for DVT prophylaxis.  She will follow-up with me in the office in 2 weeks.  She will be admitted overnight for just observation.  Discharge home tomorrow morning.

## 2022-05-29 NOTE — Addendum Note (Signed)
Addendum  created 05/29/22 1130 by Janeece Riggers, MD   Clinical Note Signed

## 2022-05-29 NOTE — H&P (Signed)
ORTHOPAEDIC H and P  REQUESTING PHYSICIAN: Nicholes Stairs, MD  PCP:  Loura Pardon, MD  Chief Complaint: Left knee pain  HPI: Rachael George is a 67 y.o. female who complains of left knee pain and swelling.  She is dealing with a meniscal root tear for a few months now.  Here today for possible meniscus root repair versus debridement.  No new complaints.  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)    type 2   Prediabetes    Sleep apnea    no cpap had weight loss   Past Surgical History:  Procedure Laterality Date   A/V SHUNTOGRAM N/A 09/08/2016   Procedure: A/V Shuntogram - Left Arm AV Graft;  Surgeon: Serafina Mitchell, MD;  Location: [place] [facility];  Service: Cardiovascular;  Laterality: N/A;   A/V SHUNTOGRAM N/A 09/02/2017   Procedure: A/V SHUNTOGRAM - Left Arm AVG;  Surgeon: Conrad Fairburn, MD;  Location: [place] [facility];  Service: Cardiovascular;  Laterality: N/A;   A/V SHUNTOGRAM N/A 12/21/2017   Procedure: A/V SHUNTOGRAM - left arm;  Surgeon: Serafina Mitchell, MD;  Location: [place] [facility];  Service: Cardiovascular;  Laterality: N/A;   A/V SHUNTOGRAM Left 10/05/2018   Procedure: A/V SHUNTOGRAM;  Surgeon: Marty Heck, MD;  Location: [place] [facility];  Service: Cardiovascular;  Laterality: Left;   ABDOMINAL HYSTERECTOMY  2000   AV FISTULA PLACEMENT Left  05/25/2016   Procedure: INSERTION OF LEFT UPPER ARM ARTERIOVENOUS (AV) LOOP GORE-TEX GRAFT ARM;  Surgeon: Elam Dutch, MD;  Location: [place];  Service: Vascular;  Laterality: Left;   [place] Right 12/06/2014   Procedure: FIRST STAGE BASILIC VEIN TRANSPOSITION - RIGHT;  Surgeon: Serafina Mitchell, MD;  Location: [place];  Service: Vascular;  Laterality: Right;   [place] Right 02/07/2015   Procedure: RIGHT ARM SECOND STAGE BASILIC VEIN TRANSPOSITION;  Surgeon: Serafina Mitchell, MD;  Location: [place];  Service: Vascular;  Laterality: Right;   [place] Left 04/22/2016   Procedure: FIRST STAGE BASILIC VEIN TRANSPOSITION LEFT ARM;  Surgeon: Conrad [place], MD;  Location: [place];  Service: Vascular;  Laterality: Left;   COLONOSCOPY W/ POLYPECTOMY     ECTOPIC PREGNANCY SURGERY  02/1982   EXCHANGE OF A DIALYSIS CATHETER Right 04/22/2016   Procedure: EXCHANGE OF A DIALYSIS CATHETER - INSERTION RIGHT INTERNAL JUGULAR & REMOVAL FROM LEFT INTERNAL JUGULAR;  Surgeon: Conrad Como, MD;  Location: [place];  Service: Vascular;  Laterality: Right;   I & D EXTREMITY Right 02/07/2015   Procedure: IRRIGATION AND DEBRIDEMENT RIGHT ARM HEMATOMA;  Surgeon: Serafina Mitchell, MD;  Location: [place];  Service: Vascular;  Laterality: Right;   Incision and debridement  post kidneytransplant surgery went home with wound vac   INSERTION OF DIALYSIS CATHETER  03/03/2016   Procedure: INSERTION OF DIALYSIS CATHETER;  Surgeon: Elam Dutch, MD;  Location: [name];  Service: Vascular;;   IR AV DIALY SHUNT INTRO [place] W/PTA/IMG LEFT  04/20/2017   IR AV DIALY SHUNT INTRO NEEDLE/INTRACATH INITIAL W/PTA/IMG LEFT  06/24/2017   IR THROMBECTOMY AV FISTULA W/THROMBOLYSIS/PTA INC/SHUNT/IMG LEFT Left 01/04/2017   IR US GUIDE VASC ACCESS LEFT  01/04/2017   kidney tranplant  2021   left shoulder surgery  08/2005   LIGATION OF ARTERIOVENOUS  FISTULA  03/03/2016    Procedure: LIGATION OF ARTERIOVENOUS  FISTULA;  Surgeon: Elam Dutch, MD;  Location: [place];  Service: Vascular;;   LIGATION OF ARTERIOVENOUS  FISTULA Right 09/06/2018   Procedure: Resection of pseudoaneursym and end to end repair of right brachial artery;  Surgeon: Elam Dutch, MD;  Location: [place];  Service: Vascular;  Laterality: Right;   NM MYOVIEW LTD  04/2017   Lexiscan Myoview [facility]): LOW RISK STUDY.  Small size mild severity minimally reversible defect in the apical anterior and mid anterior segment consistent with artifact versus breast attenuation--normal function with no RWMA and absence of coronary calcification would suggest breast attenuation over ischemia. => No significant cardiac calcification with normal post-rest function   PERIPHERAL VASCULAR BALLOON ANGIOPLASTY  09/08/2016   Procedure: Peripheral Vascular Balloon Angioplasty;  Surgeon: Serafina Mitchell, MD;  Location: [employer] [facility];  Service: Cardiovascular;;  left avf   PERIPHERAL VASCULAR BALLOON ANGIOPLASTY  09/02/2017   Procedure: PERIPHERAL VASCULAR BALLOON ANGIOPLASTY;  Surgeon: Conrad Normanna, MD;  Location: [place] [facility];  Service: Cardiovascular;;  left AV Graft   PERIPHERAL VASCULAR BALLOON ANGIOPLASTY  12/21/2017   Procedure: PERIPHERAL VASCULAR BALLOON ANGIOPLASTY;  Surgeon: Serafina Mitchell, MD;  Location: [place] [facility];  Service: Cardiovascular;;  INOMINATE / UPPER ARM AV GRAFT   PERIPHERAL VASCULAR BALLOON ANGIOPLASTY Left 05/12/2018   Procedure: PERIPHERAL VASCULAR BALLOON ANGIOPLASTY;  Surgeon: Marty Heck, MD;  Location: [place] [facility];  Service: Cardiovascular;  Laterality: Left;  Arm fistula   PERIPHERAL VASCULAR BALLOON ANGIOPLASTY Left 10/05/2018   Procedure: PERIPHERAL VASCULAR BALLOON ANGIOPLASTY;  Surgeon: Marty Heck, MD;  Location: [place] [facility];  Service: Cardiovascular;  Laterality: Left;  central and peripheral vein   PERIPHERAL VASCULAR BALLOON  ANGIOPLASTY Left 04/18/2019   Procedure: PERIPHERAL VASCULAR BALLOON ANGIOPLASTY;  Surgeon: Serafina Mitchell, MD;  Location: [place] [facility];  Service: Cardiovascular;  Laterality: Left;  arm fistula    TEE WITHOUT CARDIOVERSION N/A 03/06/2016   Procedure: TRANSESOPHAGEAL ECHOCARDIOGRAM (TEE);  Surgeon: Sueanne Margarita, MD;  Location[facility];  Service: Cardiovascular;  Laterality: N/A;   TRANSTHORACIC ECHOCARDIOGRAM  07/16/2020   TTE ([place]) 07/16/2020: Hyperdynamic LV, EF> 70%.  Sigmoid septum.  Normal wall motion.  Abnormal relaxation.  Normal RV.  AOV sclerosis.  Moderate to severe MAC with trace MR.  Trace TR.   TRANSTHORACIC ECHOCARDIOGRAM  03/31/2022   [place]:   EF 60-65%.  No RWMA. Mod LVH. Gr 1 DD.  Mild LA dilation.  Normal RV size and function.  Mild-moderate pericardial effusion but no evidence of tamponade.  Aortic valve calcification/sclerosis with no stenosis.  Normal RAP.  No significant change (on my review - effusion is not MODERATE)   TUBAL LIGATION  11/1986   Social History   Socioeconomic History   Marital status: Legally Separated  Spouse name: Not on file   Number of children: 3   Years of education: Not on file   Highest education level: Not on file  Occupational History   Occupation: none  Tobacco Use   Smoking status: Never   Smokeless tobacco: Never  Vaping Use   Vaping Use: Never used  Substance and Sexual Activity   Alcohol use: Not Currently    Comment: occ - 1 drink rarely   Drug use: No   Sexual activity: Not Currently  Other Topics Concern   Not on file  Social History Narrative      Home Situation:  3 grand-children lives with patient      Spiritual Beliefs: baptist               Social Determinants of Health   Financial Resource Strain: Low Risk  (03/18/2021)   Overall Financial Resource Strain (CARDIA)    Difficulty of Paying Living Expenses: Not very hard  Food Insecurity: No Food Insecurity (01/04/2022)    Hunger Vital Sign    Worried About Running Out of Food in the Last Year: Never true    Ran Out of Food in the Last Year: Never true  Transportation Needs: No Transportation Needs (01/04/2022)   PRAPARE - Hydrologist (Medical): No    Lack of Transportation (Non-Medical): No  Physical Activity: Not on file  Stress: No Stress Concern Present (04/27/2019)   [place]    Feeling of Stress : Only a little  Social Connections: Not on file   Family History  Problem Relation Age of Onset   Hypertension Mother    Breast cancer Maternal Aunt    Deep vein thrombosis Daughter    Hyperlipidemia Daughter    Hypertension Daughter    Prostate cancer Maternal Grandmother    Allergies  Allergen Reactions   Candesartan Anaphylaxis and Other (See Comments)    Swelling of the mouth and tongue.    Candesartan Cilexetil Anaphylaxis   Lisinopril Anaphylaxis and Other (See Comments)    Swelling of the mouth and tongue.   Nsaids Anaphylaxis and Other (See Comments)    Swelling of the mouth and tongue.   Penicillamine     Other Reaction(s): Not available   Metoprolol Tartrate Rash   Penicillins Rash and Other (See Comments)    Has patient had a PCN reaction causing immediate rash, facial/tongue/throat swelling, SOB or lightheadedness with hypotension: No Has patient had a PCN reaction causing severe rash involving mucus membranes or skin necrosis: No Has patient had a PCN reaction that required hospitalization No Has patient had a PCN reaction occurring within the last 10 years: No If all of the above answers are "NO", then may proceed with Cephalosporin use.    Prior to Admission medications   Medication Sig Start Date End Date Taking? Authorizing Provider  acetaminophen (TYLENOL) 650 MG CR tablet Take 1,300 mg by mouth 2 (two) times daily as needed for pain.   Yes [provider]  albuterol  (VENTOLIN HFA) 108 (90 Base) MCG/ACT inhaler INHALE 2 PUFFS BY MOUTH EVERY 6 HOURS AS NEEDED FOR WHEEZE OR SHORTNESS OF BREATH 06/17/20  Yes Newlin, Enobong, MD  amLODipine (NORVASC) 5 MG tablet Take 1 tablet by mouth at bedtime. 11/10/21 11/10/22 Yes [provider]  aspirin 81 MG EC tablet Take 81 mg by mouth daily. 07/31/19  Yes [provider]  budesonide-formoterol (SYMBICORT) 160-4.5 MCG/ACT inhaler  Inhale 2 puffs into the lungs 2 (two) times daily for 1 day. 12/19/19 05/14/22 Yes Charlott Rakes, MD  carvedilol (COREG) 12.5 MG tablet Take 12.5 mg by mouth 2 (two) times daily with a meal.   Yes [provider]  cetirizine (ZYRTEC) 10 MG tablet Take 10 mg by mouth daily.   Yes [provider]  Cholecalciferol 25 MCG (1000 UT) capsule Take 3,000 Units by mouth daily. 01/11/20  Yes [provider]  cinacalcet (SENSIPAR) 30 MG tablet Take 30 mg by mouth daily.   Yes [provider]  Dulaglutide (TRULICITY) [id number] [id number] SOPN ADMINISTER 0.75 MG UNDER THE SKIN 1 TIME A WEEK 03/28/21  Yes Newlin, Enobong, MD  fluticasone (FLONASE) 50 MCG/ACT nasal spray SPRAY 2 SPRAYS INTO EACH NOSTRIL EVERY DAY 07/06/20  Yes Newlin, Enobong, MD  ketoconazole (NIZORAL) 2 % cream Apply 1 Application topically daily as needed for irritation.   Yes [provider]  magnesium oxide (MAG-OX) 400 MG tablet Take 400 mg by mouth daily. 09/25/19  Yes [provider]  Multiple Vitamins-Minerals (CENTRUM SILVER 50+WOMEN) TABS Take 1 tablet by mouth daily.   Yes [provider]  mycophenolate (MYFORTIC) 180 MG EC tablet Take 360 mg by mouth 2 (two) times daily. 07/01/20  Yes [provider]  pantoprazole (PROTONIX) 40 MG tablet TAKE 1 TABLET(40 MG) BY MOUTH TWICE DAILY Patient taking differently: Take 40 mg by mouth daily. 12/30/20  Yes Charlott Rakes, MD  predniSONE (DELTASONE) 5 MG tablet Take 5 mg by mouth daily with breakfast. 05/02/21  Yes  [provider]  rosuvastatin (CRESTOR) 20 MG tablet TAKE 1 TABLET(20 MG) BY MOUTH DAILY 01/30/21  Yes Charlott Rakes, MD  tacrolimus ER (ENVARSUS XR) 1 MG TB24 Take 9 mg by mouth daily before breakfast. 05/07/20  Yes [provider]  triamcinolone cream (KENALOG) 0.1 % Apply 1 Application topically daily as needed (eczema).   Yes [provider]  Accu-Chek Softclix Lancets lancets USE AS DIRECTED TO TEST DAILY 01/13/21   Charlott Rakes, MD  albuterol (PROVENTIL) (2.5 MG/3ML) 0.083% nebulizer solution Take 3 mLs (2.5 mg total) by nebulization every 6 (six) hours as needed for wheezing or shortness of breath. ICD 10:J45.20 05/09/19   Charlott Rakes, MD  Blood Glucose Monitoring Suppl (ACCU-CHEK GUIDE) w/Device KIT 1 each by Does not apply route daily. Use as instructed to check blood sugar once daily. E11.22, N18.6, Z99.2 08/07/19   Charlott Rakes, MD  glucose blood (ACCU-CHEK GUIDE) test strip USE TO TEST BLOOD SUGAR LEVELS DAILY 01/07/21   Charlott Rakes, MD   No results found.  Positive ROS: All other systems have been reviewed and were otherwise negative with the exception of those mentioned in the HPI and as above.  Physical Exam: General: Alert, no acute distress Cardiovascular: No pedal edema Respiratory: No cyanosis, no use of accessory musculature GI: No organomegaly, abdomen is soft and non-tender Skin: No lesions in the area of chief complaint Neurologic: Sensation intact distally Psychiatric: Patient is competent for consent with normal mood and affect Lymphatic: No axillary or cervical lymphadenopathy  MUSCULOSKELETAL: Left knee and lower extremity is warm and well-perfused with no open wounds or lesions.  Assessment: 1.  Medial meniscus complex tear, initial encounter left   Plan: Plan to proceed today with arthroscopy of the left knee.  Possible meniscus root repair versus debridement.  We again discussed the risk of bleeding, infection, damage  to surrounding nerves and vessels, stiffness, arthritis, failure of repairs, risk of  anesthesia as well as risk of DVT.  She has provided informed consent.  If we do the repair she would like to stay overnight.  Plan for discharge home tomorrow.    Nicholes Stairs, MD Cell [phone]    05/29/2022 7:14 AM

## 2022-05-29 NOTE — Anesthesia Postprocedure Evaluation (Signed)
Anesthesia Post Note  Patient: Rachael George  Procedure(s) Performed: KNEE ARTHROSCOPY WITH MEDIAL MENISCAL ROOT REPAIR (Left: Knee)     Patient location during evaluation: PACU Anesthesia Type: General Level of consciousness: awake and alert Pain management: pain level controlled Vital Signs Assessment: post-procedure vital signs reviewed and stable Respiratory status: spontaneous breathing, nonlabored ventilation, respiratory function stable and patient connected to nasal cannula oxygen Cardiovascular status: blood pressure returned to baseline and stable Postop Assessment: no apparent nausea or vomiting Anesthetic complications: no   No notable events documented.  Last Vitals:  Vitals:   05/29/22 1000 05/29/22 1027  BP: (!) 152/90 (!) 172/91  Pulse: 89 88  Resp: 17 18  Temp: (!) 36.4 C 36.5 C  SpO2: 97% 99%    Last Pain:  Vitals:   05/29/22 1027  TempSrc: Oral  PainSc:                  Morgen Ritacco

## 2022-05-30 DIAGNOSIS — S83242A Other tear of medial meniscus, current injury, left knee, initial encounter: Secondary | ICD-10-CM | POA: Diagnosis not present

## 2022-05-30 LAB — GLUCOSE, CAPILLARY
Glucose-Capillary: 147 mg/dL — ABNORMAL HIGH (ref 70–99)
Glucose-Capillary: 152 mg/dL — ABNORMAL HIGH (ref 70–99)

## 2022-05-30 NOTE — Progress Notes (Signed)
Physical Therapy Treatment Patient Details Name: Rachael George MRN: [id number] DOB: 1955/08/29 Today's Date: 05/30/2022   History of Present Illness 67 yo  female presents to therapy s/p L knee arthroscopy on 05/29/2022 with limited synovectomy, medial meniscus root repair and chondroplasty of medial femoral condyle.secondary to L knee pain and edema associate with acute tear of posterior aspect of medial meniscus.  Pt is currently LLE PWB (25%). 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    PT Comments    POD # 1 am session Pt AxO x 3 very pleasant.  General bed mobility comments: self able using belt and increased time.  General transfer comment: 25% VC's on proper hand placement and tech to maintain 25% WBing thru L LE.  General Gait Details: 75% VC's on proper sequencing, proper walker to self distance and L LE positioning to achieve safe 25% WBing. General stair comments: 50% VC's on proper tech up backward with walker to achieve PWB status.  Handout also given. Will see pt again later today to Educated on HEP.  Recommendations for follow up therapy are one component of a multi-disciplinary discharge planning process, led by the attending physician.  Recommendations may be updated based on patient status, additional functional criteria and insurance authorization.  Follow Up Recommendations  Follow physician's recommendations for discharge plan and follow up therapies     Assistance Recommended at Discharge Intermittent Supervision/Assistance  Patient can return home with the following A little help with walking and/or transfers;A little help with bathing/dressing/bathroom;Assistance with cooking/housework;Assist for transportation;Help with stairs or ramp for entrance   Equipment Recommendations  Rolling walker (2 wheels)    Recommendations for Other Services       Precautions / Restrictions  Precautions Precautions: Fall Precaution Comments: Gentle knee ROM no more than 90 degrees for 4 weeks Restrictions Weight Bearing Restrictions: Yes LLE Weight Bearing: Partial weight bearing LLE Partial Weight Bearing Percentage or Pounds: 25%     Mobility  Bed Mobility Overal bed mobility: Modified Independent             General bed mobility comments: self able using belt and increased time    Transfers Overall transfer level: Needs assistance Equipment used: Rolling walker (2 wheels) Transfers: Sit to/from Stand Sit to Stand: Supervision           General transfer comment: 25% VC's on proper hand placement and tech to maintain 25% WBing thru L LE.    Ambulation/Gait Ambulation/Gait assistance: Supervision, Min guard Gait Distance (Feet): 16 Feet Assistive device: Rolling walker (2 wheels) Gait Pattern/deviations: Step-to pattern, Knee flexed in stance - left, Decreased stance time - left, Trunk flexed Gait velocity: decreased     General Gait Details: 75% VC's on proper sequencing, proper walker to self distance and L LE positioning to achieve safe 25% WBing.   Stairs Stairs: Yes Stairs assistance: Min assist Stair Management: No rails, Step to pattern, Backwards, With walker Number of Stairs: 2 General stair comments: 50% VC's on proper tech up backward with walker to achieve PWB status.  Handout also given.   Wheelchair Mobility    Modified Rankin (Stroke Patients Only)       Balance  Cognition Arousal/Alertness: Awake/alert Behavior During Therapy: WFL for tasks assessed/performed Overall Cognitive Status: Within Functional Limits for tasks assessed                                 General Comments: AxO x 3 very pleasant        Exercises      General Comments        Pertinent Vitals/Pain Pain Assessment Pain Assessment: Faces Faces Pain Scale: Hurts a  little bit Pain Location: L knee Pain Descriptors / Indicators: Grimacing Pain Intervention(s): Monitored during session, Premedicated before session, Repositioned, Ice applied    Home Living                          Prior Function            PT Goals (current goals can now be found in the care plan section) Progress towards PT goals: Progressing toward goals    Frequency    7X/week      PT Plan Current plan remains appropriate    Co-evaluation              AM-PAC PT "6 Clicks" Mobility   Outcome Measure  Help needed turning from your back to your side while in a flat bed without using bedrails?: None Help needed moving from lying on your back to sitting on the side of a flat bed without using bedrails?: None Help needed moving to and from a bed to a chair (including a wheelchair)?: None Help needed standing up from a chair using your arms (e.g., wheelchair or bedside chair)?: None Help needed to walk in hospital room?: None Help needed climbing 3-5 steps with a railing? : A Little 6 Click Score: 23    End of Session Equipment Utilized During Treatment: Gait belt Activity Tolerance: Patient tolerated treatment well;No increased pain;Other (comment) Patient left: in chair;with call bell/phone within reach Nurse Communication: Mobility status PT Visit Diagnosis: Unsteadiness on feet (R26.81);Other abnormalities of gait and mobility (R26.89);Muscle weakness (generalized) (M62.81);Difficulty in walking, not elsewhere classified (R26.2);Pain Pain - Right/Left: Left Pain - part of body: Knee     Time: [id number] PT Time Calculation (min) (ACUTE ONLY): 34 min  Charges:  $Gait Training: 8-22 mins $Therapeutic Activity: 8-22 mins                     Rica Koyanagi  PTA Acute  Rehabilitation Services Office M-F          [phone] Weekend pager [phone]

## 2022-05-30 NOTE — Discharge Summary (Signed)
Physician Discharge Summary  Patient ID: Rachael George MRN: [id number] DOB/AGE: Mar 14, 1956 67 y.o.  Admit date: 05/29/2022 Discharge date: 05/30/2022   Procedures:  Procedure(s) (LRB): KNEE ARTHROSCOPY WITH MEDIAL MENISCAL ROOT REPAIR (Left)  Attending Physician: Victorino December, MD  Admission Diagnoses: 1.  Left knee medial root meniscus tear 2.  Left knee synovitis 3.  Left knee chondromalacia medial femoral condyle  Discharge Diagnoses:  Same.   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 Patient tolerated the procedure well and brought to the recovery room in good condition and subsequently to the floor. Patient worked with physical therapy on POD #0. She had an uncomplicated hospital course and was stable for discharge.   Disposition: Discharge disposition: 01-Home or Self Care      with follow up in 2 weeks    Discharge Instructions     Call MD / Call 911   Complete by: As directed    If you experience chest pain or shortness of breath, CALL 911 and be transported to the hospital emergency room.  If you develope a fever above 101 F, pus (white drainage) or increased drainage  or redness at the wound, or calf pain, call your surgeon's office.   Constipation Prevention   Complete by: As directed    Drink plenty of fluids.  Prune juice may be helpful.  You may use a stool softener, such as Colace (over the counter) 100 mg twice a day.  Use MiraLax (over the counter) for constipation as needed.   Diet - low sodium heart healthy   Complete by: As directed    Do not put a pillow under the knee. Place it under the heel.   Complete by: As directed    Driving restrictions   Complete by: As directed    No driving for two weeks   Partial weight bearing   Complete by: As directed    % Body Weight: 25%   Laterality: left   Post-operative opioid taper instructions:   Complete by: As directed    POST-OPERATIVE OPIOID TAPER INSTRUCTIONS: It is important to wean off of your opioid medication as soon as possible. If you do not need pain medication after your surgery it is ok to stop day one. Opioids include: Codeine, Hydrocodone(Norco, Vicodin), Oxycodone(Percocet, oxycontin) and hydromorphone amongst others.  Long term and even short term use of opiods can cause: Increased pain response Dependence Constipation Depression Respiratory depression And more.  Withdrawal symptoms can include Flu like symptoms Nausea, vomiting And  more Techniques to manage these symptoms Hydrate well Eat regular healthy meals Stay active Use relaxation techniques(deep breathing, meditating, yoga) Do Not substitute Alcohol to help with tapering If you have been on opioids for less than two weeks and do not have pain than it is ok to stop all together.  Plan to wean off of opioids This plan should start within one week post op of your joint replacement. Maintain the same interval or time between taking each dose and first decrease the dose.  Cut the total daily intake of opioids by one tablet each day Next start to increase the time between doses. The last dose that should be eliminated  is the evening dose.      TED hose   Complete by: As directed    Use stockings (TED hose) for three weeks on both leg(s).  You may remove them at night for sleeping.       Allergies as of 05/30/2022       Reactions   Candesartan Anaphylaxis, Other (See Comments)   Swelling of the mouth and tongue.   Candesartan Cilexetil Anaphylaxis   Lisinopril Anaphylaxis, Other (See Comments)   Swelling of the mouth and tongue.   Nsaids Anaphylaxis, Other (See Comments)   Swelling of the mouth and tongue.   Penicillamine    Other Reaction(s): Not available   Metoprolol Tartrate Rash   Penicillins Rash, Other (See Comments)   Has patient had a PCN reaction causing immediate rash, facial/tongue/throat swelling, SOB or lightheadedness with hypotension: No Has patient had a PCN reaction causing severe rash involving mucus membranes or skin necrosis: No Has patient had a PCN reaction that required hospitalization No Has patient had a PCN reaction occurring within the last 10 years: No If all of the above answers are "NO", then may proceed with Cephalosporin use.        Medication List     STOP taking these medications    aspirin EC 81 MG tablet       TAKE these medications    Accu-Chek Guide test strip Generic drug: glucose blood USE TO TEST BLOOD SUGAR LEVELS DAILY   Accu-Chek Guide w/Device Kit 1 each by Does not apply route daily. Use as instructed to check blood sugar once daily. E11.22, N18.6, Z99.2   Accu-Chek Softclix Lancets lancets USE AS DIRECTED TO TEST DAILY   acetaminophen 650 MG CR tablet Commonly known as: TYLENOL Take 1,300 mg by mouth 2 (two) times daily as needed for pain.   albuterol (2.5 MG/3ML) 0.083% nebulizer solution Commonly known as: PROVENTIL Take 3 mLs (2.5 mg total) by nebulization every 6 (six) hours as needed for wheezing or shortness of breath. ICD 10:J45.20   albuterol 108 (90 Base) MCG/ACT inhaler Commonly known as: VENTOLIN HFA INHALE 2  PUFFS BY MOUTH EVERY 6 HOURS AS NEEDED FOR WHEEZE OR SHORTNESS OF BREATH   amLODipine 5 MG tablet Commonly known as: NORVASC Take 1 tablet by mouth at bedtime.   budesonide-formoterol 160-4.5 MCG/ACT inhaler Commonly known as: Symbicort Inhale 2 puffs into the lungs 2 (two) times daily for 1 day.   carvedilol 12.5 MG tablet Commonly known as: COREG Take 12.5 mg by mouth 2 (two) times daily with a meal.   Centrum Silver 50+Women Tabs Take 1 tablet by mouth daily.   cetirizine 10 MG tablet Commonly known as: ZYRTEC Take 10 mg by mouth daily.   Cholecalciferol 25 MCG (1000 UT) capsule Take 3,000 Units by mouth daily.  cinacalcet 30 MG tablet Commonly known as: SENSIPAR Take 30 mg by mouth daily.   fluticasone 50 MCG/ACT nasal spray Commonly known as: FLONASE SPRAY 2 SPRAYS INTO EACH NOSTRIL EVERY DAY   ketoconazole 2 % cream Commonly known as: NIZORAL Apply 1 Application topically daily as needed for irritation.   magnesium oxide 400 MG tablet Commonly known as: MAG-OX Take 400 mg by mouth daily.   mycophenolate 180 MG EC tablet Commonly known as: MYFORTIC Take 360 mg by mouth 2 (two) times daily.   ondansetron 4 MG tablet Commonly known as: Zofran Take 1 tablet (4 mg total) by mouth every 8 (eight) hours as needed for nausea or vomiting.   oxyCODONE 5 MG immediate release tablet Commonly known as: Roxicodone Take 1 tablet (5 mg total) by mouth every 6 (six) hours as needed for severe pain.   pantoprazole 40 MG tablet Commonly known as: PROTONIX TAKE 1 TABLET(40 MG) BY MOUTH TWICE DAILY What changed: See the new instructions.   predniSONE 5 MG tablet Commonly known as: DELTASONE Take 5 mg by mouth daily with breakfast.   rosuvastatin 20 MG tablet Commonly known as: CRESTOR TAKE 1 TABLET(20 MG) BY MOUTH DAILY   tacrolimus ER 1 MG Tb24 Commonly known as: ENVARSUS XR Take 9 mg by mouth daily before breakfast.   triamcinolone cream 0.1 % Commonly known  as: KENALOG Apply 1 Application topically daily as needed (eczema).   Trulicity [id number] Sopn Generic drug: Dulaglutide ADMINISTER 0.75 MG UNDER THE SKIN 1 TIME A WEEK               Durable Medical Equipment  (From admission, onward)           Start     Ordered   05/30/22 1049  For home use only DME Walker youth  Once       Question:  Patient needs a walker to treat with the following condition  Answer:  S/P medial meniscal repair   05/30/22 1049              Discharge Care Instructions  (From admission, onward)           Start     Ordered   05/30/22 0000  Partial weight bearing       Question Answer Comment  % Body Weight 25%   Laterality left      05/30/22 1052              Signed: R. Jaynie Bream, PA-C Orthopedic Surgery 05/30/2022, 3:15 PM  [facility] is now [employer] [facility] [address] [place], [place], [place] [address] Phone: [place]

## 2022-05-30 NOTE — Progress Notes (Signed)
Patient discharged to home w/ family. Given all belongings, instructions, equipment. Verbalized understanding of instructions. Escorted to pov via w/c. 

## 2022-05-30 NOTE — Progress Notes (Signed)
   Subjective: 1 Day Post-Op Procedure(s) (LRB): KNEE ARTHROSCOPY WITH MEDIAL MENISCAL ROOT REPAIR (Left) Patient seen in rounds for Dr. Stann Mainland. Patient is well, and has had no acute complaints or problems. Denies SOB or chest pain. Denies calf pain. Patient reports pain as  mild to moderate . Worked with physical therapy yesterday and feels like she did well. We will continue physical therapy today.  Objective: Vital signs in last 24 hours: Temp:  [97.5 F (36.4 C)-98.3 F (36.8 C)] 98 F (36.7 C) (03/09 0937) Pulse Rate:  [77-91] 84 (03/09 0937) Resp:  [15-20] 16 (03/09 0937) BP: (123-156)/(72-83) 123/76 (03/09 0937) SpO2:  [98 %-100 %] 99 % (03/09 0937)  Intake/Output from previous day:  Intake/Output Summary (Last 24 hours) at 05/30/2022 1040 Last data filed at 05/30/2022 0810 Gross per 24 hour  Intake 480 ml  Output --  Net 480 ml     Intake/Output this shift: Total I/O In: 240 [P.O.:240] Out: -   Labs: No results for input(s): "HGB" in the last 72 hours. No results for input(s): "WBC", "RBC", "HCT", "PLT" in the last 72 hours. No results for input(s): "NA", "K", "CL", "CO2", "BUN", "CREATININE", "GLUCOSE", "CALCIUM" in the last 72 hours. No results for input(s): "LABPT", "INR" in the last 72 hours.  Exam: General - Patient is Alert and Oriented Extremity - Neurologically intact Neurovascular intact Sensation intact distally Dorsiflexion/Plantar flexion intact Dressing - dressing C/D/I Motor Function - intact, moving foot and toes well on exam.  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   Weight as of this encounter: 85.4 kg.  DVT Prophylaxis - Aspirin Partial weight bearing as tolerated (25%) to LLE.  Continue physical therapy today. Expected discharge home today pending progress and if meeting patient goals. Scheduled for OPPT at [facility]. Follow-up in clinic in 2 weeks.  Rainey Pines, PA-C Orthopedic Surgery [phone] 05/30/2022, 10:40 AM

## 2022-05-30 NOTE — Progress Notes (Signed)
Physical Therapy Treatment Patient Details Name: Rachael George MRN: [id number] DOB: 1956-01-05 Today's Date: 05/30/2022   History of Present Illness 67 yo  female presents to therapy s/p L knee arthroscopy on 05/29/2022 with limited synovectomy, medial meniscus root repair and chondroplasty of medial femoral condyle.secondary to L knee pain and edema associate with acute tear of posterior aspect of medial meniscus.  Pt is currently LLE PWB (25%). 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    PT Comments    POD # 1 pm session Educated on HEP TE's and instructed on gentle knee ROM no more that 90 degrees for 4 weeks.  HEP handout issued.  Re Educated on PWB 25% "until MD tell you otherwise" which also means use walker at all times.  Assisted with getting dressed and proper tech lower body.  Instructed on use of TEDS.  Discussed proper tech up stairs "backward" with walker.  Handout also given.  Addressed all mobility questions, discussed appropriate activity, educated on use of ICE.  Pt ready for D/C to home.    Recommendations for follow up therapy are one component of a multi-disciplinary discharge planning process, led by the attending physician.  Recommendations may be updated based on patient status, additional functional criteria and insurance authorization.  Follow Up Recommendations  Follow physician's recommendations for discharge plan and follow up therapies     Assistance Recommended at Discharge Intermittent Supervision/Assistance  Patient can return home with the following A little help with walking and/or transfers;A little help with bathing/dressing/bathroom;Assistance with cooking/housework;Assist for transportation;Help with stairs or ramp for entrance   Equipment Recommendations  Rolling walker (2 wheels)    Recommendations for Other Services       Precautions / Restrictions  Precautions Precautions: Fall Precaution Comments: Gentle knee ROM no more than 90 degrees for 4 weeks Restrictions Weight Bearing Restrictions: Yes LLE Weight Bearing: Partial weight bearing LLE Partial Weight Bearing Percentage or Pounds: 25%     Mobility  Bed Mobility Overal bed mobility: Modified Independent             General bed mobility comments: self able using belt and increased time    Transfers Overall transfer level: Needs assistance Equipment used: Rolling walker (2 wheels) Transfers: Sit to/from Stand Sit to Stand: Supervision           General transfer comment: 25% VC's on proper hand placement and tech to maintain 25% WBing thru L LE.    Ambulation/Gait Ambulation/Gait assistance: Supervision, Min guard Gait Distance (Feet): 6 Feet Assistive device: Rolling walker (2 wheels) Gait Pattern/deviations: Step-to pattern, Knee flexed in stance - left, Decreased stance time - left, Trunk flexed Gait velocity: decreased     General Gait Details: 50% VC's on proper sequencing, proper walker to self distance and L LE positioning to achieve safe 25% WBing.   Stairs   Wheelchair Mobility    Modified Rankin (Stroke Patients Only)       Balance                                            Cognition Arousal/Alertness: Awake/alert Behavior During Therapy: WFL for tasks assessed/performed Overall Cognitive Status: Within Functional Limits for tasks assessed  General Comments: AxO x 3 very pleasant        Exercises      General Comments        Pertinent Vitals/Pain Pain Assessment Pain Assessment: Faces Faces Pain Scale: Hurts a little bit Pain Location: L knee Pain Descriptors / Indicators: Grimacing Pain Intervention(s): Monitored during session, Premedicated before session, Repositioned, Ice applied    Home Living                          Prior Function             PT Goals (current goals can now be found in the care plan section) Progress towards PT goals: Progressing toward goals    Frequency    7X/week      PT Plan Current plan remains appropriate    Co-evaluation              AM-PAC PT "6 Clicks" Mobility   Outcome Measure  Help needed turning from your back to your side while in a flat bed without using bedrails?: None Help needed moving from lying on your back to sitting on the side of a flat bed without using bedrails?: None Help needed moving to and from a bed to a chair (including a wheelchair)?: None Help needed standing up from a chair using your arms (e.g., wheelchair or bedside chair)?: None Help needed to walk in hospital room?: None Help needed climbing 3-5 steps with a railing? : A Little 6 Click Score: 23    End of Session Equipment Utilized During Treatment: Gait belt Activity Tolerance: Patient tolerated treatment well;No increased pain;Other (comment) Patient left: in chair;with call bell/phone within reach Nurse Communication: Mobility status PT Visit Diagnosis: Unsteadiness on feet (R26.81);Other abnormalities of gait and mobility (R26.89);Muscle weakness (generalized) (M62.81);Difficulty in walking, not elsewhere classified (R26.2);Pain Pain - Right/Left: Left Pain - part of body: Knee     Time: [id number] PT Time Calculation (min) (ACUTE ONLY): 28 min  Charges:  $Therapeutic Activity: 8-22 mins $Self Care/Home Management: [place]  PTA Acute  Rehabilitation Services Office M-F          [phone] Weekend pager [phone]

## 2022-05-30 NOTE — Progress Notes (Signed)
Patient c/o ace wrap feeling "bunched up" and asked about dressing changes for home. RN unwrapped ace wrap to verify type of dressing currently in place and to rewrap more smoothly. Dressing under ace wrap was cast fluff, abd and gauze which RN left in place. While checking MD orders for dressing changes at home, the rest of dressing material was removed by Cecille Rubin K PTA and incision exposed. PTA then placed gauze 4x4 over incision and the RN assisted in wrapping new ace wrap over patient's knee. Educated patient about incision care, dressing changes, showering.

## 2022-05-30 NOTE — TOC Transition Note (Signed)
Transition of Care [facility]) - CM/SW Discharge Note  Patient Details  Name: Rachael George MRN: [id number] Date of Birth: December 06, 1955  Transition of Care [facility]) CM/SW Contact:  Sherie Don, LCSW Phone Number: 05/30/2022, 11:20 AM  Clinical Narrative: Patient will need a youth rolling walker. DME referral made to [facility] with [employer]. [employer] to deliver walker once the order has been placed. CSW updated patient. TOC signing off.  Final next level of care: Home/Self Care Barriers to Discharge: No Barriers Identified  Patient Goals and CMS Choice CMS Medicare.gov Compare Post Acute Care list provided to:: Patient Choice offered to / list presented to : Patient  Discharge Plan and Services Additional resources added to the After Visit Summary for          DME Arranged: Walker youth DME Agency: [employer] Date DME Agency Contacted: 05/30/22 Representative spoke with at DME Agency: [place] Determinants of Health ([place]) Interventions SDOH Screenings   Food Insecurity: No Food Insecurity (05/29/2022)  Housing: Low Risk  (05/29/2022)  Transportation Needs: No Transportation Needs (05/29/2022)  Utilities: Not At Risk (05/29/2022)  Alcohol Screen: Low Risk  (04/27/2019)  Depression (PHQ2-9): Low Risk  (03/18/2021)  Financial Resource Strain: Low Risk  (03/18/2021)  Stress: No Stress Concern Present (04/27/2019)  Tobacco Use: Low Risk  (05/29/2022)   Readmission Risk Interventions    01/05/2022    9:53 AM  Readmission Risk Prevention Plan  Transportation Screening Complete  PCP or Specialist Appt within 5-7 Days Complete  Home Care Screening Complete  Medication Review (RN CM) Complete

## 2022-05-30 NOTE — TOC CM/SW Note (Signed)
Transition of Care [facility]) Screening Note  Patient Details  Name: JOBI BRAZEL Date of Birth: 06-25-1955  Transition [facility] Care [facility]) CM/SW Contact:    Sherie Don, LCSW Phone Number: 05/30/2022, 8:59 AM  Transition of Care Department [facility]) has reviewed patient and no TOC needs have been identified at this time. We will continue to monitor patient advancement through interdisciplinary progression rounds. If new patient transition needs arise, please place a TOC consult.

## 2022-05-30 NOTE — Plan of Care (Signed)
  Problem: Coping: Goal: Ability to adjust to condition or change in health will improve Outcome: Progressing   Problem: Coping: Goal: Level of anxiety will decrease Outcome: Progressing   Problem: Pain Managment: Goal: General experience of comfort will improve Outcome: Progressing   

## 2022-06-02 ENCOUNTER — Encounter (HOSPITAL_COMMUNITY): Payer: Self-pay | Admitting: Orthopedic Surgery

## 2022-06-17 ENCOUNTER — Other Ambulatory Visit: Payer: Self-pay | Admitting: Family Medicine

## 2022-06-17 DIAGNOSIS — E1122 Type 2 diabetes mellitus with diabetic chronic kidney disease: Secondary | ICD-10-CM

## 2022-07-08 ENCOUNTER — Ambulatory Visit: Payer: 59 | Admitting: Podiatry

## 2022-07-29 ENCOUNTER — Telehealth: Payer: Self-pay

## 2022-07-29 NOTE — Telephone Encounter (Signed)
Notes received from [facility] in provider box for review

## 2022-08-28 ENCOUNTER — Ambulatory Visit
Admission: RE | Admit: 2022-08-28 | Discharge: 2022-08-28 | Disposition: A | Discharge status: Home / Self Care | Payer: 59 | Source: Ambulatory Visit | Attending: Family Medicine | Admitting: Family Medicine

## 2022-08-28 DIAGNOSIS — Z1231 Encounter for screening mammogram for malignant neoplasm of breast: Secondary | ICD-10-CM

## 2022-09-20 NOTE — Progress Notes (Signed)
Cardiology Office Note:  .   Date:  09/27/2022  ID:  Rachael George, DOB 12/20/1955, MRN [id number] PCP: Sharmon Revere, MD  [facility] Providers Cardiologist:  Bryan Lemma, MD    Nephrologist: Dr. Allena Katz   No chief complaint on file.   History of Present Illness: .     Rachael George is an obese 67 y.o. female with a PMH notable for ESRD (s/p renal transplant-right pelvis May 2021 after HD), HTN, HLD, DM-2 (poorly controlled), RA, OSA (not using CPAP), GERD and asthma who presents here for 3 to 53-month follow-up at the request of Sharmon Revere, MD.  Rachael George was originally seen on May 25, 2022 for Preop Evaluation/Chest Pain Evaluation prior to knee surgery.  Overall, she was doing relatively well with no major symptoms.  Major complaint was left knee pain after use a walker.  She did note Off-and-on chest discomfort but without exertion.  Chronic exertional dyspnea easy fatigability but quite deconditioned.  Inhalers usually help.  Mild ankle swelling.  Deconditioned-easy fatigue.  Left knee pain. => In the absence of any ongoing symptoms ischemic chest pain, I recommended proceeding with surgery and foregoing ischemic evaluation. She underwent her knee surgery on 05/29/2022 seemingly uncomplicated. She was seen by [facility] on 07/30/2022: Transplant team doing well.  BP well-controlled.  Noted 10 pound weight loss from May 2023 to May 2024.    Subjective   INTERVAL HISTORY Doing well.  No major issues.  Happy to have her surgery done. 1-2 times in last month CP @ rest -- not as bad as before when associated with driving (using her L arm). SOB bending down - but not with exertion.  Also notes dizziness with bending over & standing back up.  No major issues/complaints.   Cardiovascular ROS: positive for - chest pain and these were 2 shoet spell !@ rest - NOT exertional. negative for - dyspnea on exertion, edema, irregular heartbeat,  orthopnea, palpitations, paroxysmal nocturnal dyspnea, rapid heart rate, shortness of breath, or syncope//near syncope; TIA/amaurosis fugax, claudication  ROS: Review of Systems - Negative except mld soreness in L knee post-op - but no longer on pain meds.     Objective   Studies Reviewed: .       04/2017 [facility]): LOW RISK STUDY.  Small size mild severity minimally reversible defect in the apical anterior and mid anterior segment consistent with artifact versus breast attenuation--normal function with no RWMA and absence of coronary calcification would suggest breast attenuation over ischemia. => No significant cardiac calcification with normal post-rest function  03/31/2022 ECHO: [place]: EF 60-65%. No RWMA. Mod LVH. Gr 1 DD. Mild LA dilation. Normal RV size and function. Mild-moderate pericardial effusion but no evidence of tamponade. Aortic valve calcification/sclerosis with no stenosis. Normal RAP. No significant change (on my review - effusion is not MODERATE)   LABS From Nephrologist: 07/13/2022: TC 168, TG 223, HDL 43, LDL 88 (Rosuvastatin increased to 40 mg) - pending recheck in July); Cr 1.14. K 4.2  Risk Assessment/Calculations:            Physical Exam:   VS:  BP 122/82   Pulse 82   Ht 5\' 1"  (1.549 m)   Wt 194 lb 12.8 oz (88.4 kg)   SpO2 97%   BMI 36.81 kg/m    Wt Readings from Last 3 Encounters:  09/21/22 194 lb 12.8 oz (88.4 kg)  05/29/22 188 lb 3.2 oz (85.4 kg)  05/25/22 188 lb 3.2 oz (85.4 kg)    GEN: Obese.  Well-groomed.  In no acute distress; healthy NECK: No JVD; No carotid bruits CARDIAC: Distant S1, S2; RRR, harsh 1/6 SEM at RUSB.  No obvious rub but cannot exclude S4 gallop.  RESPIRATORY:  Clear to auscultation without rales, wheezing or rhonchi ; nonlabored, good air movement. ABDOMEN: Soft, non-tender, non-distended EXTREMITIES:  No edema; No deformity     ASSESSMENT AND PLAN: .    Problem List Items Addressed This Visit        Cardiology Problems   Type 2 diabetes mellitus with diabetic peripheral angiopathy without gangrene (HCC) (Chronic)   Relevant Medications   rosuvastatin (CRESTOR) 40 MG tablet   Pericardial effusion   Relevant Medications   rosuvastatin (CRESTOR) 40 MG tablet   Other Relevant Orders   ECHOCARDIOGRAM COMPLETE   LVH (left ventricular hypertrophy) due to hypertensive disease, with heart failure (HCC) (Chronic)   Relevant Medications   rosuvastatin (CRESTOR) 40 MG tablet   Other Relevant Orders   ECHOCARDIOGRAM COMPLETE   Essential hypertension, benign - Primary (Chronic)   Relevant Medications   rosuvastatin (CRESTOR) 40 MG tablet   Other Relevant Orders   ECHOCARDIOGRAM COMPLETE     Other   ESRD (end stage renal disease) (HCC) (Chronic)            Dispo: Return in about 6 months (around 03/24/2023).  Total time spent: 17 min spent with patient + 15(8) min spent charting = 32 min     Signed, Marykay Lex, MD, MS Bryan Lemma, M.D., M.S. Interventional Cardiologist  [facility]  Pager # [phone] Phone # [phone] [address] [place], [place] [address]

## 2022-09-21 ENCOUNTER — Encounter: Payer: Self-pay | Admitting: Cardiology

## 2022-09-21 ENCOUNTER — Ambulatory Visit: Payer: 59 | Attending: Cardiology | Admitting: Cardiology

## 2022-09-21 VITALS — BP 122/82 | HR 82 | Ht 61.0 in | Wt 194.8 lb

## 2022-09-21 DIAGNOSIS — I10 Essential (primary) hypertension: Secondary | ICD-10-CM

## 2022-09-21 DIAGNOSIS — E1151 Type 2 diabetes mellitus with diabetic peripheral angiopathy without gangrene: Secondary | ICD-10-CM

## 2022-09-21 DIAGNOSIS — I3139 Other pericardial effusion (noninflammatory): Secondary | ICD-10-CM

## 2022-09-21 DIAGNOSIS — N186 End stage renal disease: Secondary | ICD-10-CM

## 2022-09-21 DIAGNOSIS — I110 Hypertensive heart disease with heart failure: Secondary | ICD-10-CM

## 2022-09-21 DIAGNOSIS — Z7985 Long-term (current) use of injectable non-insulin antidiabetic drugs: Secondary | ICD-10-CM

## 2022-09-21 NOTE — Patient Instructions (Signed)
Medication Instructions:   No changes  *If you need a refill on your cardiac medications before your next appointment, please call your pharmacy*   Lab Work: Not needed    Testing/Procedures: Will be schedule in Jan 2025 at [address] Your physician has requested that you have an echocardiogram. Echocardiography is a painless test that uses sound waves to create images of your heart. It provides your doctor with information about the size and shape of your heart and how well your heart's chambers and valves are working. This procedure takes approximately one hour. There are no restrictions for this procedure. Please do NOT wear cologne, perfume, aftershave, or lotions (deodorant is allowed). Please arrive 15 minutes prior to your appointment time.    Follow-Up: At [facility], you and your health needs are our priority.  As part of our continuing mission to provide you with exceptional heart care, we have created designated Provider Care Teams.  These Care Teams include your primary Cardiologist (physician) and Advanced Practice Providers (APPs -  Physician Assistants and Nurse Practitioners) who all work together to provide you with the care you need, when you need it.     Your next appointment:   6 month(s)  The format for your next appointment:   In Person  Provider:   Bryan Lemma, MD

## 2022-09-27 ENCOUNTER — Encounter: Payer: Self-pay | Admitting: Cardiology

## 2022-09-27 NOTE — Assessment & Plan Note (Signed)
Previous echo showed mild to moderate effusion which I felt was more mild than anything else.  Need to reassess to ensure that is not growing.  Plan: Recheck 2D echo

## 2022-09-27 NOTE — Assessment & Plan Note (Signed)
BP well-controlled on amlodipine 5 mg daily, carvedilol 12.5 mg twice daily Not on ACE inhibitor/ARB or ARNI because of underlying renal disease

## 2022-09-27 NOTE — Assessment & Plan Note (Signed)
Echo did show some moderate LVH from longstanding hypertension.  Only read as grade 1 diastolic function.  This could explain some exertional dyspnea but could be due to deconditioning.  No PND orthopnea with trivial edema.  Defer diuretic to nephrology.  Continue BP control: Amlodipine 5 mg daily and carvedilol 12.5 mg twice daily.

## 2022-09-27 NOTE — Assessment & Plan Note (Signed)
Managed by PCP and nephrology.  She is on Trulicity

## 2022-11-13 ENCOUNTER — Other Ambulatory Visit: Payer: Self-pay | Admitting: Family Medicine

## 2022-11-13 ENCOUNTER — Ambulatory Visit
Admission: RE | Admit: 2022-11-13 | Discharge: 2022-11-13 | Disposition: A | Discharge status: Home / Self Care | Payer: 59 | Source: Ambulatory Visit | Attending: Family Medicine | Admitting: Family Medicine

## 2022-11-13 DIAGNOSIS — Z0000 Encounter for general adult medical examination without abnormal findings: Secondary | ICD-10-CM

## 2022-11-17 ENCOUNTER — Encounter: Payer: Self-pay | Admitting: Podiatry

## 2022-11-17 ENCOUNTER — Ambulatory Visit (INDEPENDENT_AMBULATORY_CARE_PROVIDER_SITE_OTHER): Payer: 59 | Admitting: Podiatry

## 2022-11-17 VITALS — BP 142/79 | HR 95

## 2022-11-17 DIAGNOSIS — M79675 Pain in left toe(s): Secondary | ICD-10-CM

## 2022-11-17 DIAGNOSIS — M79674 Pain in right toe(s): Secondary | ICD-10-CM

## 2022-11-17 DIAGNOSIS — B351 Tinea unguium: Secondary | ICD-10-CM | POA: Diagnosis not present

## 2022-11-17 DIAGNOSIS — E1129 Type 2 diabetes mellitus with other diabetic kidney complication: Secondary | ICD-10-CM

## 2022-11-17 NOTE — Progress Notes (Signed)
  Subjective:  Patient ID: Rachael George, female    DOB: 01/20/1956,  MRN: [id number]  Rachael George presents to clinic today for at risk foot care. Pt has h/o NIDDM with chronic kidney disease and painful elongated mycotic toenails 1-5 bilaterally which are tender when wearing enclosed shoe gear. Pain is relieved with periodic professional debridement.  Chief Complaint  Patient presents with   Nail Problem    RFC/  last visit with PCP was 11/09/22, A1C- 7.7.  Patient has not taken her blood pressure medicine this morning   New problem(s): None.   PCP is Paliwal, Himanshu, MD.  Allergies  Allergen Reactions   Candesartan Anaphylaxis and Other (See Comments)    Swelling of the mouth and tongue.    Candesartan Cilexetil Anaphylaxis   Lisinopril Anaphylaxis and Other (See Comments)    Swelling of the mouth and tongue.   Nsaids Anaphylaxis and Other (See Comments)    Swelling of the mouth and tongue.   Penicillamine     Other Reaction(s): Not available   Metoprolol Tartrate Rash   Penicillins Rash and Other (See Comments)    Has patient had a PCN reaction causing immediate rash, facial/tongue/throat swelling, SOB or lightheadedness with hypotension: No Has patient had a PCN reaction causing severe rash involving mucus membranes or skin necrosis: No Has patient had a PCN reaction that required hospitalization No Has patient had a PCN reaction occurring within the last 10 years: No If all of the above answers are "NO", then may proceed with Cephalosporin use.     Review of Systems: Negative except as noted in the HPI.  Objective: No changes noted in today's physical examination. Vitals:   11/17/22 0903  BP: (!) 142/79  Pulse: 95   Rachael George is a pleasant 66 y.o. female obese in NAD. AAO x 3.  Vascular Examination: Capillary refill time immediate b/l. Vascular status intact b/l with palpable pedal pulses. Pedal hair present b/l. No edema. No pain with calf  compression b/l. Skin temperature gradient WNL b/l.   Neurological Examination: Sensation grossly intact b/l with 10 gram monofilament. Vibratory sensation intact b/l.   Dermatological Examination: Pedal skin with normal turgor, texture and tone b/l. Toenails 1-5 b/l thick, discolored, elongated with subungual debris and pain on dorsal palpation. No open wounds b/l LE. No interdigital macerations noted b/l LE.  Musculoskeletal Examination: Muscle strength 5/5 to all lower extremity muscle groups bilaterally. HAV with bunion deformity noted b/l LE. Utilizes cane for ambulation assistance.  Radiographs: None  Assessment/Plan: 1. Pain due to onychomycosis of toenails of both feet   2. Type 2 diabetes mellitus with other diabetic kidney complication, without long-term current use of insulin (HCC)    -Consent given for treatment as described below: -Examined patient. -Discussed blood pressure reading with patient. Patient is asymptomatic on today's visit. Patient advised to take blood pressure medication, repeat blood pressure at home and contact PCP/Cardiologist if it remains abnormal. Patient/Family/Caregiver/POA related understanding. -Continue foot and shoe inspections daily. Monitor blood glucose per PCP/Endocrinologist's recommendations. -Patient to continue soft, supportive shoe gear daily. -Toenails 1-5 b/l were debrided in length and girth with sterile nail nippers and dremel without iatrogenic bleeding.  -Patient/POA to call should there be question/concern in the interim.   Return in about 3 months (around 02/17/2023).  Freddie Breech, DPM

## 2023-02-23 ENCOUNTER — Ambulatory Visit: Payer: 59 | Admitting: Podiatry

## 2023-03-29 ENCOUNTER — Ambulatory Visit (HOSPITAL_COMMUNITY): Payer: 59

## 2023-03-30 ENCOUNTER — Ambulatory Visit (HOSPITAL_COMMUNITY): Payer: 59 | Attending: Cardiology

## 2023-03-30 DIAGNOSIS — I10 Essential (primary) hypertension: Secondary | ICD-10-CM | POA: Diagnosis not present

## 2023-03-30 DIAGNOSIS — I110 Hypertensive heart disease with heart failure: Secondary | ICD-10-CM | POA: Diagnosis not present

## 2023-03-30 DIAGNOSIS — I3139 Other pericardial effusion (noninflammatory): Secondary | ICD-10-CM | POA: Diagnosis not present

## 2023-03-30 LAB — ECHOCARDIOGRAM COMPLETE
Area-P 1/2: 4.21 cm2
P 1/2 time: 431 ms
S' Lateral: 2.5 cm

## 2023-04-06 DIAGNOSIS — Z139 Encounter for screening, unspecified: Secondary | ICD-10-CM | POA: Diagnosis not present

## 2023-04-06 DIAGNOSIS — E1122 Type 2 diabetes mellitus with diabetic chronic kidney disease: Secondary | ICD-10-CM | POA: Diagnosis not present

## 2023-04-06 DIAGNOSIS — G4733 Obstructive sleep apnea (adult) (pediatric): Secondary | ICD-10-CM | POA: Diagnosis not present

## 2023-04-06 DIAGNOSIS — I700 Atherosclerosis of aorta: Secondary | ICD-10-CM | POA: Diagnosis not present

## 2023-04-06 DIAGNOSIS — Z0000 Encounter for general adult medical examination without abnormal findings: Secondary | ICD-10-CM | POA: Diagnosis not present

## 2023-04-06 DIAGNOSIS — D696 Thrombocytopenia, unspecified: Secondary | ICD-10-CM | POA: Diagnosis not present

## 2023-04-06 DIAGNOSIS — J4530 Mild persistent asthma, uncomplicated: Secondary | ICD-10-CM | POA: Diagnosis not present

## 2023-04-06 DIAGNOSIS — Z79899 Other long term (current) drug therapy: Secondary | ICD-10-CM | POA: Diagnosis not present

## 2023-04-06 DIAGNOSIS — M23204 Derangement of unspecified medial meniscus due to old tear or injury, left knee: Secondary | ICD-10-CM | POA: Diagnosis not present

## 2023-04-06 DIAGNOSIS — I509 Heart failure, unspecified: Secondary | ICD-10-CM | POA: Diagnosis not present

## 2023-04-06 DIAGNOSIS — N1831 Chronic kidney disease, stage 3a: Secondary | ICD-10-CM | POA: Diagnosis not present

## 2023-04-09 ENCOUNTER — Ambulatory Visit: Payer: 59 | Admitting: Cardiology

## 2023-04-09 NOTE — Progress Notes (Deleted)
Cardiology Office Note:  .   Date:  04/09/2023  ID:  Rachael George, DOB 09/19/1955, MRN [id number] PCP: Sharmon Revere, MD  [facility] Providers Cardiologist:  Bryan Lemma, MD { Click to update primary MD,subspecialty MD or APP then REFRESH:1}    No chief complaint on file.   Patient Profile: .     Rachael George is a *** 68 y.o. female *** with a PMH notable for *** who presents here for *** at the request of Paliwal, Himanshu, MD.  Rachael George is an obese 68 y.o. female with a PMH notable for ESRD (s/p renal transplant-right pelvis May 2021 after HD), HTN, HLD, DM-2 (poorly controlled), RA, OSA (not using CPAP), GERD and asthma who presents here for 3 to 61-month follow-up at the request of Sharmon Revere, MD.  Rachael George was originally seen on May 25, 2022 for Preop Evaluation/Chest Pain Evaluation prior to knee surgery.  Overall, she was doing relatively well with no major symptoms.  Major complaint was left knee pain after use a walker.  She did note Off-and-on chest discomfort but without exertion.  Chronic exertional dyspnea easy fatigability but quite deconditioned.  Inhalers usually help.  Mild ankle swelling.  Deconditioned-easy fatigue.  Left knee pain. => In the absence of any ongoing symptoms ischemic chest pain, I recommended proceeding with surgery and foregoing ischemic evaluation. She underwent her knee surgery on 05/29/2022 seemingly uncomplicated. She was seen by [facility] on 07/30/2022: Transplant team doing well.  BP well-controlled.  Noted 10 pound weight loss from May 2023 to May 2024.       Rachael George was last seen on ***  Subjective  Discussed the use of AI scribe software for clinical note transcription with the patient, who gave verbal consent to proceed.  History of Present Illness            Cardiovascular ROS: {roscv:310661}  ROS:  Review of Systems - {ros master:310782}    Objective    Studies Reviewed: Marland Kitchen        ECHO: *** CATH: *** MONITOR: *** CT: ***  Risk Assessment/Calculations:   {Does this patient have ATRIAL FIBRILLATION?:[id number]} No BP recorded.  {Refresh Note OR Click here to enter BP  :1}***         Physical Exam:   VS:  There were no vitals taken for this visit.   Wt Readings from Last 3 Encounters:  09/21/22 194 lb 12.8 oz (88.4 kg)  05/29/22 188 lb 3.2 oz (85.4 kg)  05/25/22 188 lb 3.2 oz (85.4 kg)    GEN: Well nourished, well developed in no acute distress; *** NECK: No JVD; No carotid bruits CARDIAC: Normal S1, S2; RRR, no murmurs, rubs, gallops RESPIRATORY:  Clear to auscultation without rales, wheezing or rhonchi ; nonlabored, good air movement. ABDOMEN: Soft, non-tender, non-distended EXTREMITIES:  No edema; No deformity      ASSESSMENT AND PLAN: .    Problem List Items Addressed This Visit       Cardiology Problems   Essential hypertension, benign - Primary (Chronic)   Relevant Orders   EKG 12-Lead   Other Visit Diagnoses       Heart failure, unspecified HF chronicity, unspecified heart failure type (HCC)       Relevant Orders   EKG 12-Lead       Assessment and Plan                {Are you ordering  a CV Procedure (e.g. stress test, cath, DCCV, TEE, etc)?   Press F2        :[id number]}   Follow-Up: No follow-ups on file.  Total time spent: *** min spent with patient + *** min spent charting = *** min    Signed, Marykay Lex, MD, MS Bryan Lemma, M.D., M.S. Interventional Cardiologist  [facility]  Pager # [phone] Phone # [phone] [address] [place], [place] [address]

## 2023-04-12 NOTE — Progress Notes (Signed)
 This encounter was created in error - please disregard.

## 2023-04-20 ENCOUNTER — Other Ambulatory Visit (HOSPITAL_COMMUNITY): Payer: 59

## 2023-04-27 ENCOUNTER — Ambulatory Visit: Payer: 59 | Admitting: Cardiology

## 2023-05-25 ENCOUNTER — Ambulatory Visit (INDEPENDENT_AMBULATORY_CARE_PROVIDER_SITE_OTHER): Payer: 59 | Admitting: Podiatry

## 2023-05-25 ENCOUNTER — Encounter: Payer: Self-pay | Admitting: Podiatry

## 2023-05-25 VITALS — Ht 61.0 in | Wt 194.0 lb

## 2023-05-25 DIAGNOSIS — B351 Tinea unguium: Secondary | ICD-10-CM | POA: Diagnosis not present

## 2023-05-25 DIAGNOSIS — E1129 Type 2 diabetes mellitus with other diabetic kidney complication: Secondary | ICD-10-CM | POA: Diagnosis not present

## 2023-05-25 DIAGNOSIS — E119 Type 2 diabetes mellitus without complications: Secondary | ICD-10-CM | POA: Diagnosis not present

## 2023-05-25 DIAGNOSIS — M2012 Hallux valgus (acquired), left foot: Secondary | ICD-10-CM

## 2023-05-25 DIAGNOSIS — M79675 Pain in left toe(s): Secondary | ICD-10-CM

## 2023-05-25 DIAGNOSIS — M2011 Hallux valgus (acquired), right foot: Secondary | ICD-10-CM

## 2023-05-25 DIAGNOSIS — Z940 Kidney transplant status: Secondary | ICD-10-CM

## 2023-05-25 DIAGNOSIS — M79674 Pain in right toe(s): Secondary | ICD-10-CM | POA: Diagnosis not present

## 2023-05-25 NOTE — Progress Notes (Signed)
 ANNUAL DIABETIC FOOT EXAM  Subjective: ASEEL TRUXILLO presents today for annual diabetic foot exam.  Chief Complaint  Patient presents with   DFc    She is here for diabetic nail trim, PCP is Dr. Delia Chimes, "Last A1C was 7.1"   Patient confirms h/o diabetes.  Patient denies any h/o foot wounds.  Sharmon Revere, MD is patient's PCP.  Past Medical History:  Diagnosis Date   Anemia    Arthritis    Asthma    End-stage renal disease (ESRD) (HCC)    Renal transplant May 2021   GERD (gastroesophageal reflux disease)    Gout    Heart murmur    born with it- nothing to worry about   Heavy menstrual bleeding    History of blood transfusion 02/2016- last one   1990's- several ones    History of hiatal hernia    History of kidney stones 1980's   Hyperlipemia 12/07/2012   Hypertension    Hypothyroidism    Kidney transplant recipient 2021   Multiple gastric ulcers    Pneumonia 2012   Poorly controlled type II diabetes mellitus with renal complication (HCC)    type 2   Prediabetes    Sleep apnea    no cpap had weight loss   Patient Active Problem List   Diagnosis Date Noted   Acute tear of posterior aspect of medial meniscus of left knee 05/29/2022   Pericardial effusion 05/28/2022   Chest pain of uncertain etiology 05/28/2022   LVH (left ventricular hypertrophy) due to hypertensive disease, with heart failure (HCC) 05/25/2022   Pre-operative cardiovascular examination 05/25/2022   Acute kidney injury (HCC) 01/04/2022   UTI (urinary tract infection) 01/04/2022   Muscle weakness of lower extremity 01/04/2022   Bilateral leg weakness 01/03/2022   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   Rheumatoid arthritis (HCC) 05/30/2021   Chronic kidney disease 02/07/2021   Epigastric pain 02/07/2021   H pylori ulcer 02/07/2021   H/O transfusion of packed red blood cells 02/07/2021   History of esophageal ulcer 02/07/2021   History of multiple miscarriages 02/07/2021   Kidney  stones 02/07/2021   Vertigo 02/07/2021   ESRD (end stage renal disease) (HCC) 07/23/2020   Kidney transplant recipient 12/19/2019   Hypomagnesemia 08/22/2019   Immunosuppressive management encounter following kidney transplant 08/07/2019   Immunosuppression (HCC) 07/28/2019   Headache, unspecified 04/28/2019   Allergy, unspecified, initial encounter 10/24/2018   Anaphylactic shock, unspecified, initial encounter 10/24/2018   GERD (gastroesophageal reflux disease) 08/30/2018   OSA (obstructive sleep apnea) 09/07/2017   Other fatigue 07/05/2017   Encounter for removal of sutures 06/25/2017   Pain, unspecified 06/02/2017   Diarrhea 05/28/2017   Fever, unspecified 05/28/2017   Iron deficiency anemia 05/28/2017   Other specified coagulation defects (HCC) 05/28/2017   Encounter for screening for respiratory tuberculosis 05/28/2017   Pruritus, unspecified 05/28/2017   Shortness of breath 05/28/2017   Type 2 diabetes mellitus with diabetic peripheral angiopathy without gangrene (HCC) 05/28/2017   Secondary hyperparathyroidism of renal origin (HCC) 05/28/2017   Periodic limb movements of sleep 03/17/2017   Eczema 09/17/2016   Acute blood loss anemia 03/03/2016   Hypovolemic shock (HCC) 03/03/2016   Staphylococcus aureus bacteremia 03/03/2016   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HCC) 03/02/2016   Asthma, chronic 10/15/2015   Insomnia 10/15/2015   Sleep paralysis 10/15/2015   Sleep talking 10/15/2015   Vision problems 10/15/2015   Hematoma 02/07/2015   Chronic gout 10/05/2014   Anemia in chronic kidney disease 12/27/2013  Essential hypertension, benign 12/07/2012   Poorly controlled type 2 diabetes mellitus (HCC) 12/07/2012   Low back pain 12/07/2012   Right leg numbness 12/07/2012   Asthma, mild intermittent 12/07/2012   Hypertriglyceridemia 12/07/2012   Obesity, unspecified 12/07/2012   Past Surgical History:  Procedure Laterality Date   A/V SHUNTOGRAM N/A 09/08/2016    Procedure: A/V Shuntogram - Left Arm AV Graft;  Surgeon: Nada Libman, MD;  Location: [facility];  Service: Cardiovascular;  Laterality: N/A;   A/V SHUNTOGRAM N/A 09/02/2017   Procedure: A/V SHUNTOGRAM - Left Arm AVG;  Surgeon: Fransisco Hertz, MD;  Location: [facility] INVASIVE CV LAB;  Service: Cardiovascular;  Laterality: N/A;   A/V SHUNTOGRAM N/A 12/21/2017   Procedure: A/V SHUNTOGRAM - left arm;  Surgeon: Nada Libman, MD;  Location: [facility];  Service: Cardiovascular;  Laterality: N/A;   A/V SHUNTOGRAM Left 10/05/2018   Procedure: A/V SHUNTOGRAM;  Surgeon: Cephus Shelling, MD;  Location[facility] INVASIVE CV LAB;  Service: Cardiovascular;  Laterality: Left;   ABDOMINAL HYSTERECTOMY  2000   AV FISTULA PLACEMENT Left 05/25/2016   Procedure: INSERTION OF LEFT UPPER ARM ARTERIOVENOUS (AV) LOOP GORE-TEX GRAFT ARM;  Surgeon: Sherren Kerns, MD;  Location: [facility] OR;  Service: Vascular;  Laterality: Left;   BASCILIC VEIN TRANSPOSITION Right 12/06/2014   Procedure: FIRST STAGE BASILIC VEIN TRANSPOSITION - RIGHT;  Surgeon: Nada Libman, MD;  Location: [facility] OR;  Service: Vascular;  Laterality: Right;   BASCILIC VEIN TRANSPOSITION Right 02/07/2015   Procedure: RIGHT ARM SECOND STAGE BASILIC VEIN TRANSPOSITION;  Surgeon: Nada Libman, MD;  Location: [facility] OR;  Service: Vascular;  Laterality: Right;   BASCILIC VEIN TRANSPOSITION Left 04/22/2016   Procedure: FIRST STAGE BASILIC VEIN TRANSPOSITION LEFT ARM;  Surgeon: Fransisco Hertz, MD;  Location: [facility] OR;  Service: Vascular;  Laterality: Left;   COLONOSCOPY W/ POLYPECTOMY     ECTOPIC PREGNANCY SURGERY  02/1982   EXCHANGE OF A DIALYSIS CATHETER Right 04/22/2016   Procedure: EXCHANGE OF A DIALYSIS CATHETER - INSERTION RIGHT INTERNAL JUGULAR & REMOVAL FROM LEFT INTERNAL JUGULAR;  Surgeon: Fransisco Hertz, MD;  Location: [facility] OR;  Service: Vascular;  Laterality: Right;   I & D EXTREMITY Right 02/07/2015   Procedure: IRRIGATION AND DEBRIDEMENT RIGHT ARM  HEMATOMA;  Surgeon: Nada Libman, MD;  Location: [facility] OR;  Service: Vascular;  Laterality: Right;   Incision and debridement     post kidneytransplant surgery went home with wound vac   INSERTION OF DIALYSIS CATHETER  03/03/2016   Procedure: INSERTION OF DIALYSIS CATHETER;  Surgeon: Sherren Kerns, MD;  Location: [facility] OR;  Service: Vascular;;   IR AV DIALY SHUNT INTRO NEEDLE/INTRACATH INITIAL W/PTA/IMG LEFT  04/20/2017   IR AV DIALY SHUNT INTRO NEEDLE/INTRACATH INITIAL W/PTA/IMG LEFT  06/24/2017   IR THROMBECTOMY AV FISTULA W/THROMBOLYSIS/PTA INC/SHUNT/IMG LEFT Left 01/04/2017   IR US GUIDE VASC ACCESS LEFT  01/04/2017   kidney tranplant  2021   KNEE ARTHROSCOPY WITH MENISCAL REPAIR Left 05/29/2022   Procedure: KNEE ARTHROSCOPY WITH MEDIAL MENISCAL ROOT REPAIR;  Surgeon: Yolonda Kida, MD;  Location: [facility] ORS;  Service: Orthopedics;  Laterality: Left;  76   left shoulder surgery  08/2005   LIGATION OF ARTERIOVENOUS  FISTULA  03/03/2016   Procedure: LIGATION OF ARTERIOVENOUS  FISTULA;  Surgeon: Sherren Kerns, MD;  Location: [facility] OR;  Service: Vascular;;   LIGATION OF ARTERIOVENOUS  FISTULA Right 09/06/2018   Procedure: Resection of pseudoaneursym  and end to end repair of right brachial artery;  Surgeon: Sherren Kerns, MD;  Location: [facility] OR;  Service: Vascular;  Laterality: Right;   NM MYOVIEW LTD  04/2017   Lexiscan Myoview [facility]): LOW RISK STUDY.  Small size mild severity minimally reversible defect in the apical anterior and mid anterior segment consistent with artifact versus breast attenuation--normal function with no RWMA and absence of coronary calcification would suggest breast attenuation over ischemia. => No significant cardiac calcification with normal post-rest function   PERIPHERAL VASCULAR BALLOON ANGIOPLASTY  09/08/2016   Procedure: Peripheral Vascular Balloon Angioplasty;  Surgeon: Nada Libman, MD;  Location: [facility];  Service: Cardiovascular;;  left avf    PERIPHERAL VASCULAR BALLOON ANGIOPLASTY  09/02/2017   Procedure: PERIPHERAL VASCULAR BALLOON ANGIOPLASTY;  Surgeon: Fransisco Hertz, MD;  Location: [facility] INVASIVE CV LAB;  Service: Cardiovascular;;  left AV Graft   PERIPHERAL VASCULAR BALLOON ANGIOPLASTY  12/21/2017   Procedure: PERIPHERAL VASCULAR BALLOON ANGIOPLASTY;  Surgeon: Nada Libman, MD;  [facility]: [facility];  Service: Cardiovascular;;  INOMINATE / UPPER ARM AV GRAFT   PERIPHERAL VASCULAR BALLOON ANGIOPLASTY Left 05/12/2018   Procedure: PERIPHERAL VASCULAR BALLOON ANGIOPLASTY;  Surgeon: Cephus Shelling, MD;  [facility]: [facility];  Service: Cardiovascular;  Laterality: Left;  Arm fistula   PERIPHERAL VASCULAR BALLOON ANGIOPLASTY Left 10/05/2018   Procedure: PERIPHERAL VASCULAR BALLOON ANGIOPLASTY;  Surgeon: Cephus Shelling, MD;  [facility]: [facility];  Service: Cardiovascular;  Laterality: Left;  central and peripheral vein   PERIPHERAL VASCULAR BALLOON ANGIOPLASTY Left 04/18/2019   Procedure: PERIPHERAL VASCULAR BALLOON ANGIOPLASTY;  Surgeon: Nada Libman, MD;  Location: [facility];  Service: Cardiovascular;  Laterality: Left;  arm fistula    TEE WITHOUT CARDIOVERSION N/A 03/06/2016   Procedure: TRANSESOPHAGEAL ECHOCARDIOGRAM (TEE);  Surgeon: Quintella Reichert, MD;  Location: [facility] ENDOSCOPY;  Service: Cardiovascular;  Laterality: N/A;   TRANSTHORACIC ECHOCARDIOGRAM  07/16/2020   TTE ([facility]) 07/16/2020: Hyperdynamic LV, EF> 70%.  Sigmoid septum.  Normal wall motion.  Abnormal relaxation.  Normal RV.  AOV sclerosis.  Moderate to severe MAC with trace MR.  Trace TR.   TRANSTHORACIC ECHOCARDIOGRAM  03/31/2022   [place]:   EF 60-65%.  No RWMA. Mod LVH. Gr 1 DD.  Mild LA dilation.  Normal RV size and function.  Mild-moderate pericardial effusion but no evidence of tamponade.  Aortic valve calcification/sclerosis with no stenosis.  Normal RAP.  No significant change (on my review - effusion is  not MODERATE)   TUBAL LIGATION  11/1986   Current Outpatient Medications on File Prior to Visit  Medication Sig Dispense Refill   Accu-Chek Softclix Lancets lancets USE AS DIRECTED TO TEST DAILY 100 each 8   acetaminophen (TYLENOL) 650 MG CR tablet Take 1,300 mg by mouth 2 (two) times daily as needed for pain.     albuterol (PROVENTIL) (2.5 MG/3ML) 0.083% nebulizer solution Take 3 mLs (2.5 mg total) by nebulization every 6 (six) hours as needed for wheezing or shortness of breath. ICD 10:J45.20 75 mL 0   albuterol (VENTOLIN HFA) 108 (90 Base) MCG/ACT inhaler INHALE 2 PUFFS BY MOUTH EVERY 6 HOURS AS NEEDED FOR WHEEZE OR SHORTNESS OF BREATH 54 each 1   Blood Glucose Monitoring Suppl (ACCU-CHEK GUIDE) w/Device KIT 1 each by Does not apply route daily. Use as instructed to check blood sugar once daily. E11.22, N18.6, Z99.2 1 kit 0   budesonide-formoterol (SYMBICORT) 160-4.5 MCG/ACT  inhaler Inhale 2 puffs into the lungs 2 (two) times daily for 1 day. 3 each 1   carvedilol (COREG) 12.5 MG tablet Take 12.5 mg by mouth 2 (two) times daily with a meal.     cetirizine (ZYRTEC) 10 MG tablet Take 10 mg by mouth daily.     Cholecalciferol 25 MCG (1000 UT) capsule Take 3,000 Units by mouth daily.     cinacalcet (SENSIPAR) 30 MG tablet Take 30 mg by mouth daily.     Dulaglutide (TRULICITY) 0.75 MG/0.5ML SOPN ADMINISTER 0.75 MG UNDER THE SKIN 1 TIME A WEEK 2 mL 0   fluticasone (FLONASE) 50 MCG/ACT nasal spray SPRAY 2 SPRAYS INTO EACH NOSTRIL EVERY DAY 48 mL 1   glucose blood (ACCU-CHEK GUIDE) test strip USE TO TEST BLOOD SUGAR LEVELS DAILY 50 strip 1   ketoconazole (NIZORAL) 2 % cream Apply 1 Application topically daily as needed for irritation.     magnesium oxide (MAG-OX) 400 MG tablet Take 400 mg by mouth daily.     Multiple Vitamins-Minerals (CENTRUM SILVER 50+WOMEN) TABS Take 1 tablet by mouth daily.     mycophenolate (MYFORTIC) 180 MG EC tablet Take 360 mg by mouth 2 (two) times daily.     ondansetron  (ZOFRAN) 4 MG tablet Take 1 tablet (4 mg total) by mouth every 8 (eight) hours as needed for nausea or vomiting. 20 tablet 0   oxyCODONE (ROXICODONE) 5 MG immediate release tablet Take 1 tablet (5 mg total) by mouth every 6 (six) hours as needed for severe pain. 20 tablet 0   pantoprazole (PROTONIX) 40 MG tablet TAKE 1 TABLET(40 MG) BY MOUTH TWICE DAILY (Patient taking differently: Take 40 mg by mouth daily.) 30 tablet 2   predniSONE (DELTASONE) 5 MG tablet Take 5 mg by mouth daily with breakfast.     rosuvastatin (CRESTOR) 40 MG tablet Take 40 mg by mouth daily.     tacrolimus ER (ENVARSUS XR) 1 MG TB24 Take 9 mg by mouth daily before breakfast.     triamcinolone cream (KENALOG) 0.1 % Apply 1 Application topically daily as needed (eczema).     No current facility-administered medications on file prior to visit.    Allergies  Allergen Reactions   Candesartan Anaphylaxis and Other (See Comments)    Swelling of the mouth and tongue.    Candesartan Cilexetil Anaphylaxis   Lisinopril Anaphylaxis and Other (See Comments)    Swelling of the mouth and tongue.   Nsaids Anaphylaxis and Other (See Comments)    Swelling of the mouth and tongue.   Penicillamine     Other Reaction(s): Not available   Metoprolol Tartrate Rash   Penicillins Rash and Other (See Comments)    Has patient had a PCN reaction causing immediate rash, facial/tongue/throat swelling, SOB or lightheadedness with hypotension: No Has patient had a PCN reaction causing severe rash involving mucus membranes or skin necrosis: No Has patient had a PCN reaction that required hospitalization No Has patient had a PCN reaction occurring within the last 10 years: No If all of the above answers are "NO", then may proceed with Cephalosporin use.    Social History   Occupational History   Occupation: none  Tobacco Use   Smoking status: Never   Smokeless tobacco: Never  Vaping Use   Vaping status: Never Used  Substance and Sexual  Activity   Alcohol use: Not Currently    Comment: occ - 1 drink rarely   Drug use: No   Sexual activity:  Not Currently   Family History  Problem Relation Age of Onset   Hypertension Mother    Breast cancer Maternal Aunt    Deep vein thrombosis Daughter    Hyperlipidemia Daughter    Hypertension Daughter    Prostate cancer Maternal Grandmother    Immunization History  Administered Date(s) Administered   Influenza Split 11/22/2014   Influenza, Seasonal, Injecte, Preservative Fre 11/24/2016   Influenza,inj,Quad PF,6+ Mos 12/07/2012, 12/25/2013, 11/23/2017, 12/19/2018   Influenza-Unspecified 11/23/2017   Moderna Sars-Covid-2 Vaccination 06/02/2019, 06/30/2019, 11/24/2019   PPD Test 03/13/2015   Pneumococcal Conjugate-13 07/24/2015   Pneumococcal Polysaccharide-23 12/25/2013   Tdap 07/21/2012     Review of Systems: Negative except as noted in the HPI.   Objective: There were no vitals filed for this visit.  LARIZA COTHRON is a pleasant 68 y.o. female in NAD. AAO X 3.  Diabetic foot exam was performed with the following findings:   Vascular Examination: Capillary refill time immediate b/l. Vascular status intact b/l with palpable pedal pulses. Pedal hair present b/l. No pain with calf compression b/l. Skin temperature gradient WNL b/l. No cyanosis or clubbing b/l. No ischemia or gangrene noted b/l.   Neurological Examination: Sensation grossly intact b/l with 10 gram monofilament. Vibratory sensation intact b/l.   Dermatological Examination: Pedal skin with normal turgor, texture and tone b/l.  No open wounds. No interdigital macerations.   Toenails 1-5 b/l thick, discolored, elongated with subungual debris and pain on dorsal palpation.   No corns, calluses nor porokeratotic lesions noted.  Musculoskeletal Examination: Muscle strength 5/5 to all lower extremity muscle groups bilaterally. No pain, crepitus or joint limitation noted with ROM bilateral LE. HAV with bunion  deformity noted b/l LE.      Lab Results  Component Value Date   HGBA1C 7.3 (H) 05/18/2022   ADA Risk Categorization: Low Risk :  Patient has all of the following: Intact protective sensation No prior foot ulcer  No severe deformity Pedal pulses present  Assessment: 1. Pain due to onychomycosis of toenails of both feet   2. Hallux valgus, acquired, bilateral   3. Kidney transplant recipient   4. Type 2 diabetes mellitus with other diabetic kidney complication, without long-term current use of insulin (HCC)   5. Encounter for diabetic foot exam [facility])     Plan: Patient was evaluated and treated. All patient's and/or POA's questions/concerns addressed on today's visit. Mycotic toenails 1-5 debrided in length and girth without incident. Continue soft, supportive shoe gear daily. Report any pedal injuries to medical professional. Call office if there are any quesitons/concerns. -Patient/POA to call should there be question/concern in the interim. Return in about 4 months (around 09/24/2023).  Freddie Breech, DPM      [place] LOCATION: [address] [place] [address]                   Office [phone]   [facility] LOCATION: [address] [place], [place] [address] Office [phone]

## 2023-06-20 DIAGNOSIS — E1169 Type 2 diabetes mellitus with other specified complication: Secondary | ICD-10-CM | POA: Insufficient documentation

## 2023-06-20 NOTE — Progress Notes (Deleted)
 Cardiology Office Note:  .   Date:  06/20/2023  ID:  RYELEIGH George, DOB 04-Jun-1955, MRN [id number] PCP: Sharmon Revere, MD  [facility] Providers Cardiologist:  Bryan Lemma, MD { Click to update primary MD,subspecialty MD or APP then REFRESH:1}    No chief complaint on file.   Patient Profile: .     Rachael George is an obese 68 y.o. female with a PMH notable for ESRD (s/p Kidney RxPlnt 07/2019 after HD), HTN, HLD & poorly controlled DM-2, RA & OSA (not on CPAP) who presents here for *** at the request of Sharmon Revere, MD.  Rachael George was originally seen on May 25, 2022 for Preop Evaluation/Chest Pain Evaluation prior to knee surgery.  Overall, she was doing relatively well with no major CV symptoms. => In the absence of any ongoing symptoms ischemic chest pain, I recommended proceeding with surgery and foregoing ischemic evaluation. She underwent her knee surgery on 05/29/2022 seemingly uncomplicated. She was seen by [facility] on 07/30/2022: Transplant team doing well.  BP well-controlled.  Noted 10 pound weight loss from May 2023 to May 2024.     Rachael George was last seen on September 21, 2022.  Recheck 2D echocardiogram to assess status of pericardial effusion.  Deferred BP control to nephrology.  No medication changes made  Subjective  Discussed the use of AI scribe software for clinical note transcription with the patient, who gave verbal consent to proceed.  History of Present Illness         Cardiovascular ROS: {roscv:310661}  ROS:  Review of Systems - {ros master:310782}    Objective    Studies Reviewed: Marland Kitchen        ECHO 03/2023: LV EF 60-65%. No RWMA. Mild conc LVH. ~ diastolic fxn with mild LA dilation. Normal RV.  Small pericardial effusion.  Aortic sclerosis with no stenosis.  Pericardial effusion smaller than previous study.  Risk Assessment/Calculations:   {Does this patient have ATRIAL  FIBRILLATION?[phone]} No BP recorded.  {Refresh Note OR Click here to enter BP  :1}***         Physical Exam:   VS:  There were no vitals taken for this visit.   Wt Readings from Last 3 Encounters:  05/25/23 194 lb (88 kg)  09/21/22 194 lb 12.8 oz (88.4 kg)  05/29/22 188 lb 3.2 oz (85.4 kg)    GEN: Well nourished, well developed in no acute distress; *** NECK: No JVD; No carotid bruits CARDIAC: RRR, Normal S1, S2; harsh 1/6 SEM at RUSB.  S4 gallop; no rubs RESPIRATORY:  Clear to auscultation without rales, wheezing or rhonchi ; nonlabored, good air movement. ABDOMEN: Soft, non-tender, non-distended EXTREMITIES:  No edema; No deformity      ASSESSMENT AND PLAN: .    Problem List Items Addressed This Visit       Cardiology Problems   Essential hypertension, benign (Chronic)   Hyperlipidemia associated with type 2 diabetes mellitus (HCC) (Chronic)   LVH (left ventricular hypertrophy) due to hypertensive disease, with heart failure (HCC) (Chronic)   Pericardial effusion (Chronic)   Pericardial effusion reduced in recent echocardiogram.  No signs or symptoms of tamponade. Suspect the original effusion was probably related to uremia.      Type 2 diabetes mellitus with diabetic peripheral angiopathy without gangrene (HCC) - Primary (Chronic)     Other   ESRD (end stage renal disease) (HCC) (Chronic)   Obesity, unspecified (Chronic)    Assessment and  Plan              {Are you ordering a CV Procedure (e.g. stress test, cath, DCCV, TEE, etc)?   Press F2        :[id number]}   Follow-Up: No follow-ups on file.  Total time spent: *** min spent with patient + *** min spent charting = *** min    Signed, Marykay Lex, MD, MS Bryan Lemma, M.D., M.S. Interventional Cardiologist  [facility]  Pager # [phone] Phone # [phone] [address] [place], [place] [address]

## 2023-06-20 NOTE — Assessment & Plan Note (Deleted)
 Pericardial effusion reduced in recent echocardiogram.  No signs or symptoms of tamponade. Suspect the original effusion was probably related to uremia.

## 2023-06-21 ENCOUNTER — Ambulatory Visit: Payer: 59 | Attending: Cardiology | Admitting: Cardiology

## 2023-06-21 DIAGNOSIS — I10 Essential (primary) hypertension: Secondary | ICD-10-CM

## 2023-06-21 DIAGNOSIS — N186 End stage renal disease: Secondary | ICD-10-CM

## 2023-06-21 DIAGNOSIS — E1151 Type 2 diabetes mellitus with diabetic peripheral angiopathy without gangrene: Secondary | ICD-10-CM

## 2023-06-21 DIAGNOSIS — I3139 Other pericardial effusion (noninflammatory): Secondary | ICD-10-CM

## 2023-06-21 DIAGNOSIS — I110 Hypertensive heart disease with heart failure: Secondary | ICD-10-CM

## 2023-06-21 DIAGNOSIS — E66812 Obesity, class 2: Secondary | ICD-10-CM

## 2023-06-21 DIAGNOSIS — E1169 Type 2 diabetes mellitus with other specified complication: Secondary | ICD-10-CM

## 2023-07-23 ENCOUNTER — Other Ambulatory Visit: Payer: Self-pay | Admitting: Family Medicine

## 2023-07-23 DIAGNOSIS — Z1231 Encounter for screening mammogram for malignant neoplasm of breast: Secondary | ICD-10-CM

## 2023-07-26 ENCOUNTER — Other Ambulatory Visit: Payer: Self-pay | Admitting: Family Medicine

## 2023-07-26 DIAGNOSIS — R221 Localized swelling, mass and lump, neck: Secondary | ICD-10-CM

## 2023-07-27 ENCOUNTER — Ambulatory Visit
Admission: RE | Admit: 2023-07-27 | Discharge: 2023-07-27 | Disposition: A | Discharge status: Home / Self Care | Source: Ambulatory Visit | Attending: Family Medicine | Admitting: Family Medicine

## 2023-07-27 ENCOUNTER — Other Ambulatory Visit: Payer: Self-pay | Admitting: Family Medicine

## 2023-07-27 DIAGNOSIS — M542 Cervicalgia: Secondary | ICD-10-CM

## 2023-07-29 ENCOUNTER — Ambulatory Visit
Admission: RE | Admit: 2023-07-29 | Discharge: 2023-07-29 | Disposition: A | Discharge status: Home / Self Care | Source: Ambulatory Visit | Attending: Family Medicine | Admitting: Family Medicine

## 2023-07-29 DIAGNOSIS — R221 Localized swelling, mass and lump, neck: Secondary | ICD-10-CM

## 2023-07-30 ENCOUNTER — Other Ambulatory Visit: Payer: Self-pay | Admitting: Family Medicine

## 2023-07-30 DIAGNOSIS — G8929 Other chronic pain: Secondary | ICD-10-CM

## 2023-08-11 ENCOUNTER — Ambulatory Visit
Admission: RE | Admit: 2023-08-11 | Discharge: 2023-08-11 | Disposition: A | Discharge status: Home / Self Care | Source: Ambulatory Visit | Attending: Family Medicine | Admitting: Family Medicine

## 2023-08-11 DIAGNOSIS — G8929 Other chronic pain: Secondary | ICD-10-CM

## 2023-08-23 ENCOUNTER — Other Ambulatory Visit: Payer: Self-pay | Admitting: Medical Genetics

## 2023-08-30 ENCOUNTER — Ambulatory Visit
Admission: RE | Admit: 2023-08-30 | Discharge: 2023-08-30 | Disposition: A | Discharge status: Home / Self Care | Source: Ambulatory Visit | Attending: Family Medicine

## 2023-08-30 DIAGNOSIS — Z1231 Encounter for screening mammogram for malignant neoplasm of breast: Secondary | ICD-10-CM

## 2023-09-08 ENCOUNTER — Other Ambulatory Visit: Payer: Self-pay

## 2023-09-08 DIAGNOSIS — Z006 Encounter for examination for normal comparison and control in clinical research program: Secondary | ICD-10-CM

## 2023-09-17 LAB — GENECONNECT MOLECULAR SCREEN: Genetic Analysis Overall Interpretation: NEGATIVE

## 2023-09-28 ENCOUNTER — Encounter: Payer: Self-pay | Admitting: Podiatry

## 2023-09-28 ENCOUNTER — Ambulatory Visit (INDEPENDENT_AMBULATORY_CARE_PROVIDER_SITE_OTHER): Admitting: Podiatry

## 2023-09-28 DIAGNOSIS — M79675 Pain in left toe(s): Secondary | ICD-10-CM

## 2023-09-28 DIAGNOSIS — E1129 Type 2 diabetes mellitus with other diabetic kidney complication: Secondary | ICD-10-CM | POA: Diagnosis not present

## 2023-09-28 DIAGNOSIS — M79674 Pain in right toe(s): Secondary | ICD-10-CM

## 2023-09-28 DIAGNOSIS — B351 Tinea unguium: Secondary | ICD-10-CM

## 2023-09-28 NOTE — Progress Notes (Signed)
 Subjective:  Patient ID: Rachael George, female    DOB: [birth year],  MRN: [id number]  Chief Complaint  Patient presents with   Diabetes    [facility] Toenail trim A1C 7.7.      69 y.o. female presents with at risk foot care with h/o NIDDM, is s/p kidney transplant and is on chronic immunosuppressive therapy.  Patient is seen today for and painful thick toenails that are difficult to trim. Pain interferes with ambulation. Aggravating factors include wearing enclosed shoe gear. Pain is relieved with periodic professional debridement..    Review of Systems: Negative except as noted in the HPI.   Allergies  Allergen Reactions   Candesartan Anaphylaxis and Other (See Comments)    Swelling of the mouth and tongue.    Candesartan Cilexetil Anaphylaxis   Lisinopril Anaphylaxis and Other (See Comments)    Swelling of the mouth and tongue.   Nsaids Anaphylaxis and Other (See Comments)    Swelling of the mouth and tongue.   Penicillamine     Other Reaction(s): Not available   Metoprolol  Tartrate Rash   Penicillins Rash and Other (See Comments)    Has patient had a PCN reaction causing immediate rash, facial/tongue/throat swelling, SOB or lightheadedness with hypotension: No Has patient had a PCN reaction causing severe rash involving mucus membranes or skin necrosis: No Has patient had a PCN reaction that required hospitalization No Has patient had a PCN reaction occurring within the last 10 years: No If all of the above answers are NO, then may proceed with Cephalosporin use.     Objective:  There were no vitals filed for this visit. Constitutional Patient is a pleasant 68 y.o. female in NAD. AAO x 3.  Vascular Capillary fill time to digits immediate b/l.  DP/PT pulse(s) are palpable b/l lower extremities. Pedal hair sparse. Lower extremity skin temperature gradient within normal limits. No pain with calf compression b/l. No edema noted b/l lower extremities. No cyanosis or clubbing noted.    Neurologic Protective sensation intact 5/5 intact bilaterally with 10g monofilament b/l. Vibratory sensation intact b/l. No clonus b/l.   Dermatologic Pedal skin is warm and supple b/l.  No open wounds b/l lower extremities. No interdigital macerations b/l lower extremities. Toenails 1-5 b/l elongated, discolored, dystrophic, thickened, crumbly with subungual debris and tenderness to dorsal palpation. No hyperkeratotic nor porokeratotic lesions present on today's visit.  Orthopedic: Normal muscle strength 5/5 to all lower extremity muscle groups bilaterally. Patient ambulates independent of any assistive aids. HAV with bunion deformity noted b/l LE.   Radiographs:  None  Assessment:   1. Pain due to onychomycosis of toenails of both feet   2. Type 2 diabetes mellitus with other diabetic kidney complication, without long-term current use of insulin  (HCC)    Plan:  Consent given for treatment. Patient examined. All patient's and/or POA's questions/concerns addressed on today's visit. Toenails 1-5 debrided in length and girth without incident. Continue foot and shoe inspections daily. Monitor blood glucose per PCP/Endocrinologist's recommendations. Continue soft, supportive shoe gear daily. Report any pedal injuries to medical professional. Call office if there are any questions/concerns. -Patient/POA to call should there be question/concern in the interim.  Return in about 3 months (around 12/29/2023).  Delon LITTIE Merlin, DPM      [facility] LOCATION: [address]  [place], [place] [address]                   Office [phone]   [facility] LOCATION: [address] [place], [place] [address] Office [phone]

## 2024-01-11 ENCOUNTER — Ambulatory Visit: Admitting: Podiatry

## 2024-02-19 ENCOUNTER — Emergency Department (HOSPITAL_COMMUNITY)

## 2024-02-19 ENCOUNTER — Other Ambulatory Visit: Payer: Self-pay

## 2024-02-19 ENCOUNTER — Emergency Department (HOSPITAL_COMMUNITY)
Admission: EM | Admit: 2024-02-19 | Discharge: 2024-02-19 | Disposition: A | Discharge status: Home / Self Care | Attending: Emergency Medicine | Admitting: Emergency Medicine

## 2024-02-19 ENCOUNTER — Encounter (HOSPITAL_COMMUNITY): Payer: Self-pay

## 2024-02-19 DIAGNOSIS — Y92410 Unspecified street and highway as the place of occurrence of the external cause: Secondary | ICD-10-CM | POA: Insufficient documentation

## 2024-02-19 DIAGNOSIS — M25512 Pain in left shoulder: Secondary | ICD-10-CM | POA: Insufficient documentation

## 2024-02-19 DIAGNOSIS — R1030 Lower abdominal pain, unspecified: Secondary | ICD-10-CM | POA: Insufficient documentation

## 2024-02-19 DIAGNOSIS — R079 Chest pain, unspecified: Secondary | ICD-10-CM

## 2024-02-19 DIAGNOSIS — R0789 Other chest pain: Secondary | ICD-10-CM

## 2024-02-19 DIAGNOSIS — R072 Precordial pain: Secondary | ICD-10-CM | POA: Insufficient documentation

## 2024-02-19 LAB — CBG MONITORING, ED: Glucose-Capillary: 144 mg/dL — ABNORMAL HIGH (ref 70–99)

## 2024-02-19 NOTE — ED Notes (Signed)
 Pt d/c home per EDP order. Discharge summary reviewed with pt, pt verbalizes understanding. Off unit via WC, Reports d/c ride home.
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
## 2024-04-17 ENCOUNTER — Ambulatory Visit: Admitting: Cardiology

## 2024-04-18 ENCOUNTER — Ambulatory Visit: Admitting: Podiatry

## 2024-07-07 ENCOUNTER — Ambulatory Visit: Admitting: Cardiology
